# Patient Record
Sex: Female | Born: 1950 | Race: White | Hispanic: No | Smoking: Former smoker
Health system: Southern US, Community
[De-identification: ages and names within clinical notes are randomized; demographics above are authoritative.]

## PROBLEM LIST (undated history)

## (undated) DIAGNOSIS — G8929 Other chronic pain: Secondary | ICD-10-CM

## (undated) DIAGNOSIS — J45909 Unspecified asthma, uncomplicated: Secondary | ICD-10-CM

## (undated) DIAGNOSIS — A6000 Herpesviral infection of urogenital system, unspecified: Secondary | ICD-10-CM

## (undated) DIAGNOSIS — IMO0001 Reserved for inherently not codable concepts without codable children: Secondary | ICD-10-CM

## (undated) DIAGNOSIS — I272 Pulmonary hypertension, unspecified: Secondary | ICD-10-CM

## (undated) DIAGNOSIS — R7302 Impaired glucose tolerance (oral): Secondary | ICD-10-CM

## (undated) DIAGNOSIS — G4730 Sleep apnea, unspecified: Secondary | ICD-10-CM

## (undated) DIAGNOSIS — K921 Melena: Secondary | ICD-10-CM

## (undated) DIAGNOSIS — I10 Essential (primary) hypertension: Secondary | ICD-10-CM

## (undated) DIAGNOSIS — J449 Chronic obstructive pulmonary disease, unspecified: Secondary | ICD-10-CM

## (undated) DIAGNOSIS — F329 Major depressive disorder, single episode, unspecified: Secondary | ICD-10-CM

## (undated) DIAGNOSIS — M1990 Unspecified osteoarthritis, unspecified site: Secondary | ICD-10-CM

## (undated) DIAGNOSIS — F411 Generalized anxiety disorder: Secondary | ICD-10-CM

## (undated) DIAGNOSIS — J189 Pneumonia, unspecified organism: Secondary | ICD-10-CM

## (undated) DIAGNOSIS — R32 Unspecified urinary incontinence: Secondary | ICD-10-CM

## (undated) DIAGNOSIS — Z9889 Other specified postprocedural states: Secondary | ICD-10-CM

## (undated) DIAGNOSIS — Z87442 Personal history of urinary calculi: Secondary | ICD-10-CM

## (undated) DIAGNOSIS — N3281 Overactive bladder: Secondary | ICD-10-CM

## (undated) DIAGNOSIS — R0683 Snoring: Secondary | ICD-10-CM

## (undated) DIAGNOSIS — K219 Gastro-esophageal reflux disease without esophagitis: Secondary | ICD-10-CM

## (undated) DIAGNOSIS — Z8719 Personal history of other diseases of the digestive system: Secondary | ICD-10-CM

## (undated) DIAGNOSIS — I499 Cardiac arrhythmia, unspecified: Secondary | ICD-10-CM

## (undated) DIAGNOSIS — M545 Low back pain, unspecified: Secondary | ICD-10-CM

## (undated) DIAGNOSIS — E785 Hyperlipidemia, unspecified: Secondary | ICD-10-CM

## (undated) DIAGNOSIS — E119 Type 2 diabetes mellitus without complications: Secondary | ICD-10-CM

## (undated) DIAGNOSIS — J309 Allergic rhinitis, unspecified: Secondary | ICD-10-CM

## (undated) DIAGNOSIS — C801 Malignant (primary) neoplasm, unspecified: Secondary | ICD-10-CM

## (undated) DIAGNOSIS — G4719 Other hypersomnia: Secondary | ICD-10-CM

## (undated) HISTORY — DX: Other hypersomnia: G47.19

## (undated) HISTORY — PX: ABDOMINAL HYSTERECTOMY: SHX81

## (undated) HISTORY — PX: APPENDECTOMY: SHX54

## (undated) HISTORY — DX: Unspecified osteoarthritis, unspecified site: M19.90

## (undated) HISTORY — DX: Hyperlipidemia, unspecified: E78.5

## (undated) HISTORY — DX: Impaired glucose tolerance (oral): R73.02

## (undated) HISTORY — PX: CARPAL TUNNEL RELEASE: SHX101

## (undated) HISTORY — PX: SKIN CANCER EXCISION: SHX779

## (undated) HISTORY — DX: Snoring: R06.83

## (undated) HISTORY — DX: Essential (primary) hypertension: I10

## (undated) HISTORY — PX: CYST EXCISION: SHX5701

## (undated) HISTORY — DX: Overactive bladder: N32.81

## (undated) HISTORY — DX: Generalized anxiety disorder: F41.1

## (undated) HISTORY — DX: Type 2 diabetes mellitus without complications: E11.9

## (undated) HISTORY — PX: HEEL SPUR SURGERY: SHX665

## (undated) HISTORY — DX: Pulmonary hypertension, unspecified: I27.20

## (undated) HISTORY — DX: Melena: K92.1

## (undated) HISTORY — DX: Herpesviral infection of urogenital system, unspecified: A60.00

## (undated) HISTORY — PX: TONSILLECTOMY AND ADENOIDECTOMY: SUR1326

## (undated) HISTORY — DX: Major depressive disorder, single episode, unspecified: F32.9

## (undated) HISTORY — DX: Unspecified asthma, uncomplicated: J45.909

## (undated) HISTORY — DX: Allergic rhinitis, unspecified: J30.9

## (undated) HISTORY — DX: Chronic obstructive pulmonary disease, unspecified: J44.9

## (undated) HISTORY — PX: KNEE SURGERY: SHX244

---

## 2001-05-03 ENCOUNTER — Other Ambulatory Visit: Admission: RE | Admit: 2001-05-03 | Discharge: 2001-05-03 | Payer: Self-pay | Admitting: *Deleted

## 2002-04-20 HISTORY — PX: COLONOSCOPY: SHX174

## 2002-07-06 ENCOUNTER — Encounter: Payer: Self-pay | Admitting: Sports Medicine

## 2002-07-06 ENCOUNTER — Encounter: Admission: RE | Admit: 2002-07-06 | Discharge: 2002-07-06 | Payer: Self-pay | Admitting: Sports Medicine

## 2002-07-31 ENCOUNTER — Other Ambulatory Visit: Admission: RE | Admit: 2002-07-31 | Discharge: 2002-07-31 | Payer: Self-pay | Admitting: *Deleted

## 2003-08-02 ENCOUNTER — Other Ambulatory Visit: Admission: RE | Admit: 2003-08-02 | Discharge: 2003-08-02 | Payer: Self-pay | Admitting: *Deleted

## 2005-07-27 ENCOUNTER — Ambulatory Visit: Payer: Self-pay | Admitting: Internal Medicine

## 2005-08-03 ENCOUNTER — Ambulatory Visit: Payer: Self-pay | Admitting: Internal Medicine

## 2005-12-31 ENCOUNTER — Ambulatory Visit: Payer: Self-pay | Admitting: Internal Medicine

## 2006-03-16 ENCOUNTER — Ambulatory Visit: Payer: Self-pay | Admitting: Internal Medicine

## 2006-07-16 ENCOUNTER — Ambulatory Visit: Payer: Self-pay | Admitting: Internal Medicine

## 2006-08-02 ENCOUNTER — Ambulatory Visit: Payer: Self-pay | Admitting: Internal Medicine

## 2006-08-13 ENCOUNTER — Ambulatory Visit: Payer: Self-pay | Admitting: Internal Medicine

## 2006-08-13 LAB — CONVERTED CEMR LAB
ALT: 17 units/L (ref 0–40)
AST: 16 units/L (ref 0–37)
Albumin: 3.6 g/dL (ref 3.5–5.2)
Alkaline Phosphatase: 48 units/L (ref 39–117)
BUN: 10 mg/dL (ref 6–23)
Basophils Absolute: 0 10*3/uL (ref 0.0–0.1)
Basophils Relative: 0.6 % (ref 0.0–1.0)
Bilirubin Urine: NEGATIVE
Bilirubin, Direct: 0.1 mg/dL (ref 0.0–0.3)
CO2: 30 meq/L (ref 19–32)
Calcium: 9 mg/dL (ref 8.4–10.5)
Chloride: 103 meq/L (ref 96–112)
Cholesterol: 169 mg/dL (ref 0–200)
Creatinine, Ser: 0.6 mg/dL (ref 0.4–1.2)
Eosinophils Absolute: 0.2 10*3/uL (ref 0.0–0.6)
Eosinophils Relative: 2.9 % (ref 0.0–5.0)
GFR calc Af Amer: 134 mL/min
GFR calc non Af Amer: 111 mL/min
Glucose, Bld: 102 mg/dL — ABNORMAL HIGH (ref 70–99)
HCT: 37.1 % (ref 36.0–46.0)
HDL: 47 mg/dL (ref >39.0)
Hemoglobin, Urine: NEGATIVE
Hemoglobin: 12.6 g/dL (ref 12.0–15.0)
Ketones, ur: NEGATIVE mg/dL
LDL Cholesterol: 91 mg/dL (ref 0–99)
Leukocytes, UA: NEGATIVE
Lymphocytes Relative: 39.7 % (ref 12.0–46.0)
MCHC: 34 g/dL (ref 30.0–36.0)
MCV: 88.4 fL (ref 78.0–100.0)
Monocytes Absolute: 0.4 10*3/uL (ref 0.2–0.7)
Monocytes Relative: 5.2 % (ref 3.0–11.0)
Neutro Abs: 4.3 10*3/uL (ref 1.4–7.7)
Neutrophils Relative %: 51.6 % (ref 43.0–77.0)
Nitrite: NEGATIVE
Platelets: 249 10*3/uL (ref 150–400)
Potassium: 4.1 meq/L (ref 3.5–5.1)
RBC: 4.2 M/uL (ref 3.87–5.11)
RDW: 13.7 % (ref 11.5–14.6)
Sodium: 139 meq/L (ref 135–145)
Specific Gravity, Urine: 1.01 (ref 1.000–1.03)
TSH: 4.25 microintl units/mL (ref 0.35–5.50)
Total Bilirubin: 0.9 mg/dL (ref 0.3–1.2)
Total CHOL/HDL Ratio: 3.6
Total Protein, Urine: NEGATIVE mg/dL
Total Protein: 6.3 g/dL (ref 6.0–8.3)
Triglycerides: 156 mg/dL — ABNORMAL HIGH (ref 0–149)
Urine Glucose: NEGATIVE mg/dL
Urobilinogen, UA: 0.2 (ref 0.0–1.0)
VLDL: 31 mg/dL (ref 0–40)
WBC: 8.1 10*3/uL (ref 4.5–10.5)
pH: 6.5 (ref 5.0–8.0)

## 2006-08-17 ENCOUNTER — Ambulatory Visit: Payer: Self-pay | Admitting: Internal Medicine

## 2006-10-18 ENCOUNTER — Ambulatory Visit (HOSPITAL_BASED_OUTPATIENT_CLINIC_OR_DEPARTMENT_OTHER): Admission: RE | Admit: 2006-10-18 | Discharge: 2006-10-18 | Payer: Self-pay | Admitting: Orthopedic Surgery

## 2006-12-03 DIAGNOSIS — A6000 Herpesviral infection of urogenital system, unspecified: Secondary | ICD-10-CM

## 2006-12-03 DIAGNOSIS — E039 Hypothyroidism, unspecified: Secondary | ICD-10-CM | POA: Insufficient documentation

## 2006-12-03 DIAGNOSIS — E7800 Pure hypercholesterolemia, unspecified: Secondary | ICD-10-CM | POA: Insufficient documentation

## 2006-12-03 DIAGNOSIS — I10 Essential (primary) hypertension: Secondary | ICD-10-CM

## 2006-12-03 HISTORY — DX: Herpesviral infection of urogenital system, unspecified: A60.00

## 2006-12-03 HISTORY — DX: Essential (primary) hypertension: I10

## 2007-04-06 ENCOUNTER — Telehealth (INDEPENDENT_AMBULATORY_CARE_PROVIDER_SITE_OTHER): Payer: Self-pay | Admitting: *Deleted

## 2007-08-20 ENCOUNTER — Emergency Department (HOSPITAL_COMMUNITY): Admission: EM | Admit: 2007-08-20 | Discharge: 2007-08-20 | Payer: Self-pay | Admitting: Family Medicine

## 2008-04-03 ENCOUNTER — Emergency Department (HOSPITAL_COMMUNITY): Admission: EM | Admit: 2008-04-03 | Discharge: 2008-04-03 | Payer: Self-pay | Admitting: Emergency Medicine

## 2008-07-17 ENCOUNTER — Emergency Department (HOSPITAL_COMMUNITY): Admission: EM | Admit: 2008-07-17 | Discharge: 2008-07-17 | Payer: Self-pay | Admitting: Family Medicine

## 2008-08-16 ENCOUNTER — Ambulatory Visit: Payer: Self-pay | Admitting: Internal Medicine

## 2008-08-16 DIAGNOSIS — J449 Chronic obstructive pulmonary disease, unspecified: Secondary | ICD-10-CM | POA: Insufficient documentation

## 2008-08-16 DIAGNOSIS — F329 Major depressive disorder, single episode, unspecified: Secondary | ICD-10-CM

## 2008-08-16 DIAGNOSIS — J309 Allergic rhinitis, unspecified: Secondary | ICD-10-CM

## 2008-08-16 DIAGNOSIS — F32A Depression, unspecified: Secondary | ICD-10-CM | POA: Insufficient documentation

## 2008-08-16 DIAGNOSIS — E785 Hyperlipidemia, unspecified: Secondary | ICD-10-CM

## 2008-08-16 DIAGNOSIS — F411 Generalized anxiety disorder: Secondary | ICD-10-CM

## 2008-08-16 DIAGNOSIS — J45909 Unspecified asthma, uncomplicated: Secondary | ICD-10-CM

## 2008-08-16 DIAGNOSIS — J4489 Other specified chronic obstructive pulmonary disease: Secondary | ICD-10-CM

## 2008-08-16 DIAGNOSIS — F3289 Other specified depressive episodes: Secondary | ICD-10-CM

## 2008-08-16 HISTORY — DX: Other specified depressive episodes: F32.89

## 2008-08-16 HISTORY — DX: Major depressive disorder, single episode, unspecified: F32.9

## 2008-08-16 HISTORY — DX: Hyperlipidemia, unspecified: E78.5

## 2008-08-16 HISTORY — DX: Generalized anxiety disorder: F41.1

## 2008-08-16 HISTORY — DX: Allergic rhinitis, unspecified: J30.9

## 2008-08-16 HISTORY — DX: Other specified chronic obstructive pulmonary disease: J44.89

## 2008-08-16 HISTORY — DX: Chronic obstructive pulmonary disease, unspecified: J44.9

## 2008-08-16 HISTORY — DX: Unspecified asthma, uncomplicated: J45.909

## 2008-08-27 ENCOUNTER — Telehealth: Payer: Self-pay | Admitting: Internal Medicine

## 2008-09-05 ENCOUNTER — Ambulatory Visit: Payer: Self-pay | Admitting: Internal Medicine

## 2008-09-05 LAB — CONVERTED CEMR LAB
ALT: 26 units/L (ref 0–35)
AST: 30 units/L (ref 0–37)
Albumin: 3.5 g/dL (ref 3.5–5.2)
Alkaline Phosphatase: 80 units/L (ref 39–117)
BUN: 11 mg/dL (ref 6–23)
Basophils Absolute: 0.1 10*3/uL (ref 0.0–0.1)
Basophils Relative: 1.7 % (ref 0.0–3.0)
Bilirubin Urine: NEGATIVE
Bilirubin, Direct: 0.2 mg/dL (ref 0.0–0.3)
CO2: 30 meq/L (ref 19–32)
Calcium: 9 mg/dL (ref 8.4–10.5)
Chloride: 108 meq/L (ref 96–112)
Cholesterol: 176 mg/dL (ref 0–200)
Creatinine, Ser: 0.6 mg/dL (ref 0.4–1.2)
Direct LDL: 87.5 mg/dL
Eosinophils Absolute: 0.3 10*3/uL (ref 0.0–0.7)
Eosinophils Relative: 3.3 % (ref 0.0–5.0)
GFR calc non Af Amer: 110.04 mL/min (ref >60)
Glucose, Bld: 136 mg/dL — ABNORMAL HIGH (ref 70–99)
HCT: 36.8 % (ref 36.0–46.0)
HDL: 34.3 mg/dL — ABNORMAL LOW (ref >39.00)
Hemoglobin, Urine: NEGATIVE
Hemoglobin: 13.1 g/dL (ref 12.0–15.0)
Leukocytes, UA: NEGATIVE
Lymphocytes Relative: 26.3 % (ref 12.0–46.0)
Lymphs Abs: 2.1 10*3/uL (ref 0.7–4.0)
MCHC: 35.7 g/dL (ref 30.0–36.0)
MCV: 89.4 fL (ref 78.0–100.0)
Monocytes Absolute: 0.3 10*3/uL (ref 0.1–1.0)
Monocytes Relative: 4 % (ref 3.0–12.0)
Neutro Abs: 5.1 10*3/uL (ref 1.4–7.7)
Neutrophils Relative %: 64.7 % (ref 43.0–77.0)
Nitrite: NEGATIVE
Platelets: 219 10*3/uL (ref 150.0–400.0)
Potassium: 4.4 meq/L (ref 3.5–5.1)
RBC: 4.11 M/uL (ref 3.87–5.11)
RDW: 12.8 % (ref 11.5–14.6)
Sodium: 141 meq/L (ref 135–145)
Specific Gravity, Urine: 1.02 (ref 1.000–1.030)
TSH: 3.93 microintl units/mL (ref 0.35–5.50)
Total Bilirubin: 0.5 mg/dL (ref 0.3–1.2)
Total CHOL/HDL Ratio: 5
Total Protein, Urine: NEGATIVE mg/dL
Total Protein: 6.4 g/dL (ref 6.0–8.3)
Triglycerides: 433 mg/dL — ABNORMAL HIGH (ref 0.0–149.0)
Urine Glucose: NEGATIVE mg/dL
Urobilinogen, UA: 0.2 (ref 0.0–1.0)
VLDL: 86.6 mg/dL — ABNORMAL HIGH (ref 0.0–40.0)
WBC: 7.9 10*3/uL (ref 4.5–10.5)
pH: 6 (ref 5.0–8.0)

## 2008-09-06 LAB — CONVERTED CEMR LAB: Hgb A1c MFr Bld: 6.5 % (ref 4.6–6.5)

## 2008-09-10 ENCOUNTER — Ambulatory Visit: Payer: Self-pay | Admitting: Internal Medicine

## 2009-06-20 ENCOUNTER — Ambulatory Visit: Payer: Self-pay | Admitting: Internal Medicine

## 2009-06-20 DIAGNOSIS — K589 Irritable bowel syndrome without diarrhea: Secondary | ICD-10-CM | POA: Insufficient documentation

## 2009-06-20 DIAGNOSIS — J45901 Unspecified asthma with (acute) exacerbation: Secondary | ICD-10-CM | POA: Insufficient documentation

## 2009-06-20 DIAGNOSIS — K921 Melena: Secondary | ICD-10-CM

## 2009-06-20 DIAGNOSIS — E739 Lactose intolerance, unspecified: Secondary | ICD-10-CM | POA: Insufficient documentation

## 2009-06-20 HISTORY — DX: Melena: K92.1

## 2010-01-09 ENCOUNTER — Telehealth: Payer: Self-pay | Admitting: Internal Medicine

## 2010-01-13 ENCOUNTER — Telehealth: Payer: Self-pay | Admitting: Gastroenterology

## 2010-03-04 ENCOUNTER — Emergency Department (HOSPITAL_COMMUNITY): Admission: EM | Admit: 2010-03-04 | Discharge: 2010-03-04 | Payer: Self-pay | Admitting: Emergency Medicine

## 2010-05-20 NOTE — Progress Notes (Signed)
Summary: HCTZ  Phone Note Call from Patient   Caller: Patient Summary of Call: Pt states she would like to increase HCTZ dosage. She states she is taking 25mg  two times a day. She feels that it is not working--  please advise Initial call taken by: Brenton Grills MA,  January 09, 2010 5:12 PM  Follow-up for Phone Call        by my note last visit, there was no edema,  HCTZ normally does not help after the first 25 mg  needs OV to further address this issue Follow-up by: Corwin Levins MD,  January 09, 2010 5:49 PM  Additional Follow-up for Phone Call Additional follow up Details #1::        Spoke with female at home number  (husband) who advised that he would inform pt ot make appt for edema eval. Additional Follow-up by: Margaret Pyle, CMA,  January 10, 2010 11:52 AM

## 2010-05-20 NOTE — Assessment & Plan Note (Signed)
Summary: BLOOD IN STOOL/NWS   Vital Signs:  Patient profile:   60 year old female Height:      62 inches Weight:      228 pounds BMI:     41.85 O2 Sat:      97 % on Room air Temp:     97.6 degrees F oral Pulse rate:   62 / minute BP sitting:   132 / 90  (left arm) Cuff size:   large  Vitals Entered ByZella Ball Ewing (June 20, 2009 10:32 AM)  O2 Flow:  Room air  CC: Blood in stool/RE   CC:  Blood in stool/RE.  History of Present Illness: plans to apply for medicaid;  no insurance today;  c/w small volume trace blood on tissue only 6 times in the past 4 months without rectal pain, fever, abd pain, n/v, wt loss, night sweats or other constitutional symtpoms;  last colonscopy aprpox 5 yrs; has signficinat ongoing financial stressors at this time;  also with increased IBS type symptoms with crampy pains internittent with loose stool off and on for several motnhs, gas pain and "grumbling" - but no n/v, distension or vomiting or wt loss as above.  Incidently today also with mild wheezing without fever, prod cough, CP, sob/doe, orthopnea, pnd or worsening LE edema.    Problems Prior to Update: 1)  Asthma Unspecified With Exacerbation  (ICD-493.92) 2)  Glucose Intolerance  (ICD-271.3) 3)  Preventive Health Care  (ICD-V70.0) 4)  Allergic Rhinitis  (ICD-477.9) 5)  Depression  (ICD-311) 6)  Hyperlipidemia  (ICD-272.4) 7)  COPD  (ICD-496) 8)  Anxiety  (ICD-300.00) 9)  Asthma  (ICD-493.90) 10)  Genital Herpes  (ICD-054.10) 11)  Hypercholesterolemia  (ICD-272.0) 12)  Hypothyroidism  (ICD-244.9) 13)  Hypertension  (ICD-401.9)  Medications Prior to Update: 1)  Alprazolam 1 Mg Tabs (Alprazolam) .... Take 1 Tablet By Mouth Twice A Day 2)  Diclofenac Sodium 75 Mg Tbec (Diclofenac Sodium) .... Take 1 Tablet By Mouth Twice A Day 3)  Estradiol 2 Mg Tabs (Estradiol) .... Take 1 Tablet By Mouth Once A Day 4)  Metoprolol Tartrate 50 Mg Tabs (Metoprolol Tartrate) .Marland Kitchen.. 1 By Mouth Two Times A Day 5)   Pravastatin Sodium 40 Mg Tabs (Pravastatin Sodium) .... Take 1 Tablet By Mouth Once A Day 6)  Ventolin Hfa 108 (90 Base) Mcg/act Aers (Albuterol Sulfate) .Marland Kitchen.. 1-2 Puff As Needed 7)  Flovent Hfa 220 Mcg/act Aero (Fluticasone Propionate  Hfa) .Marland Kitchen.. 1 Puff Two Times A Day 8)  Zoloft 100 Mg Tabs (Sertraline Hcl) .Marland Kitchen.. 1 By Mouth Once Daily 9)  Adult Aspirin Ec Low Strength 81 Mg Tbec (Aspirin) .Marland Kitchen.. 1po Once Daily 10)  Hydrochlorothiazide 25 Mg Tabs (Hydrochlorothiazide) .Marland Kitchen.. 1 By Mouth Once Daily  Current Medications (verified): 1)  Alprazolam 1 Mg Tabs (Alprazolam) .... Take 1 Tablet By Mouth Twice A Day As Needed Nerves 2)  Diclofenac Sodium 75 Mg Tbec (Diclofenac Sodium) .... Take 1 Tablet By Mouth Twice A Day As Needed Pain 3)  Estradiol 2 Mg Tabs (Estradiol) .... Take 1 Tablet By Mouth Once A Day 4)  Metoprolol Tartrate 50 Mg Tabs (Metoprolol Tartrate) .Marland Kitchen.. 1 By Mouth Two Times A Day 5)  Pravastatin Sodium 40 Mg Tabs (Pravastatin Sodium) .... Take 1 Tablet By Mouth Once A Day 6)  Ventolin Hfa 108 (90 Base) Mcg/act Aers (Albuterol Sulfate) .Marland Kitchen.. 1-2 Puff As Needed 7)  Advair Diskus 250-50 Mcg/dose Aepb (Fluticasone-Salmeterol) .Marland Kitchen.. 1 Puff Two Times A Day 8)  Zoloft 100 Mg Tabs (Sertraline Hcl) .Marland Kitchen.. 1 By Mouth Once Daily 9)  Adult Aspirin Ec Low Strength 81 Mg Tbec (Aspirin) .Marland Kitchen.. 1po Once Daily 10)  Hydrochlorothiazide 25 Mg Tabs (Hydrochlorothiazide) .Marland Kitchen.. 1 By Mouth Once Daily 11)  Prednisone 10 Mg Tabs (Prednisone) .... 3po Qd For 3days, Then 2po Qd For 3days, Then 1po Qd For 3days, Then Stop 12)  Anusol-Hc 25 Mg Supp (Hydrocortisone Acetate) .Marland Kitchen.. 1 Pr Two Times A Day As Needed  Allergies (verified): 1)  Tramadol Hcl (Tramadol Hcl)  Past History:  Past Surgical History: Last updated: 12/03/2006 Appendectomy Tonsillectomy adnoidnectomy right carple tunnel syndrome Hysterectomy  Social History: Last updated: 06/20/2009 Married 1 son currently unemployed - former [employer] Alcohol use-yes Former Smoker disabled - from march 2010  Risk Factors: Smoking Status: quit (08/16/2008)  Past Medical History: Hypertension Hypothyroidism Asthma Anxiety COPD Hyperlipidemia Depression DJD lumber, hands, knee hx of genital herpes Allergic rhinitis glucose intolerance  Social History: Reviewed history from 08/16/2008 and no changes required. 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   Head:  normocephalic and atraumatic.   Eyes:  vision grossly intact, pupils equal, and pupils round.   Ears:  R ear normal and L ear normal.   Nose:  no external deformity and no nasal discharge.   Mouth:  no gingival abnormalities and pharynx pink and moist.   Neck:  supple and no masses.   Lungs:  normal respiratory effort and normal breath sounds.   Heart:  normal rate and regular rhythm.   Abdomen:  soft, non-tender, and normal bowel sounds.   Msk:  no joint tenderness and no joint swelling.   Extremities:  no edema, no erythema    Impression & Recommendations:  Problem # 1:  ASTHMA UNSPECIFIED WITH EXACERBATION (ICD-493.92)  Her updated medication list for this problem includes:    Ventolin Hfa 108 (90 Base) Mcg/act Aers (Albuterol sulfate) .Marland Kitchen... 1-2 puff as needed    Advair Diskus 250-50 Mcg/dose Aepb (Fluticasone-salmeterol) .Marland Kitchen... 1 puff two times a day    Prednisone 10 Mg Tabs (Prednisone) .Marland Kitchen... 3po qd for 3days, then 2po qd for 3days, then 1po qd for 3days, then stop treat as above, f/u any worsening signs or symptoms , gave advair smaple as well   Problem # 2:  HYPERTENSION (ICD-401.9)  Her updated medication list for this problem includes:    Metoprolol Tartrate 50 Mg Tabs (Metoprolol tartrate) .Marland Kitchen... 1 by mouth two times a day     Hydrochlorothiazide 25 Mg Tabs (Hydrochlorothiazide) .Marland Kitchen... 1 by mouth once daily  BP today: 132/90 Prior BP: 150/112 (09/10/2008)  Labs Reviewed: K+: 4.4 (09/05/2008) Creat: : 0.6 (09/05/2008)   Chol: 176 (09/05/2008)   HDL: 34.30 (09/05/2008)   LDL: 91 (08/13/2006)   TG: 433.0 (09/05/2008) stable overall by hx and exam, ok to continue meds/tx as is   Problem # 3:  IBS (ICD-564.1) stable overall by hx and exam, ok to continue meds/tx as is   Problem # 4:  HEMATOCHEZIA (ICD-578.1) small volume, diff includes hemorrhoid, fissure, malignancy or other;  tx for now with anusol HC  asd, I rec'd colonoscopy but she declines at this time  Complete Medication List: 1)  Alprazolam 1 Mg Tabs (Alprazolam) .... Take 1  tablet by mouth twice a day as needed nerves 2)  Diclofenac Sodium 75 Mg Tbec (Diclofenac sodium) .... Take 1 tablet by mouth twice a day as needed pain 3)  Estradiol 2 Mg Tabs (Estradiol) .... Take 1 tablet by mouth once a day 4)  Metoprolol Tartrate 50 Mg Tabs (Metoprolol tartrate) .Marland Kitchen.. 1 by mouth two times a day 5)  Pravastatin Sodium 40 Mg Tabs (Pravastatin sodium) .... Take 1 tablet by mouth once a day 6)  Ventolin Hfa 108 (90 Base) Mcg/act Aers (Albuterol sulfate) .Marland Kitchen.. 1-2 puff as needed 7)  Advair Diskus 250-50 Mcg/dose Aepb (Fluticasone-salmeterol) .Marland Kitchen.. 1 puff two times a day 8)  Zoloft 100 Mg Tabs (Sertraline hcl) .Marland Kitchen.. 1 by mouth once daily 9)  Adult Aspirin Ec Low Strength 81 Mg Tbec (Aspirin) .Marland Kitchen.. 1po once daily 10)  Hydrochlorothiazide 25 Mg Tabs (Hydrochlorothiazide) .Marland Kitchen.. 1 by mouth once daily 11)  Prednisone 10 Mg Tabs (Prednisone) .... 3po qd for 3days, then 2po qd for 3days, then 1po qd for 3days, then stop 12)  Anusol-hc 25 Mg Supp (Hydrocortisone acetate) .Marland Kitchen.. 1 pr two times a day as needed  Patient Instructions: 1)  Please take all new medications as prescribed 2)  Continue all previous medications as before this visit  3)  Please schedule a follow-up appointment  in 1 year or sooner if needed Prescriptions: ANUSOL-HC 25 MG SUPP (HYDROCORTISONE ACETATE) 1 pr two times a day as needed  #20 x 1   Entered and Authorized by:   Corwin Levins MD   Signed by:   Corwin Levins MD on 06/20/2009   Method used:   Print then Give to Patient   RxID:   [id number] PREDNISONE 10 MG TABS (PREDNISONE) 3po qd for 3days, then 2po qd for 3days, then 1po qd for 3days, then stop  #18 x 0   Entered and Authorized by:   Corwin Levins MD   Signed by:   Corwin Levins MD on 06/20/2009   Method used:   Print then Give to Patient   RxID:   [id number] ALPRAZOLAM 1 MG TABS (ALPRAZOLAM) Take 1 tablet by mouth twice a day as needed nerves  #60 x 2   Entered and Authorized by:   Corwin Levins MD   Signed by:   Corwin Levins MD on 06/20/2009   Method used:   Print then Give to Patient   RxID:   [id number] DICLOFENAC SODIUM 75 MG TBEC (DICLOFENAC SODIUM) Take 1 tablet by mouth twice a day as needed pain  #180 x 3   Entered and Authorized by:   Corwin Levins MD   Signed by:   Corwin Levins MD on 06/20/2009   Method used:   Print then Give to Patient   RxID:   [id number] ESTRADIOL 2 MG TABS (ESTRADIOL) Take 1 tablet by mouth once a day  #90 x 3   Entered and Authorized by:   Corwin Levins MD   Signed by:   Corwin Levins MD on 06/20/2009   Method used:   Print then Give to Patient   RxID:   [id number] METOPROLOL TARTRATE 50 MG TABS (METOPROLOL TARTRATE) 1 by mouth two times a day  #180 x 3   Entered and Authorized by:   Corwin Levins MD   Signed by:   Corwin Levins MD on 06/20/2009   Method used:   Print then Give to Patient   RxID:   [id number]  PRAVASTATIN SODIUM 40 MG TABS (PRAVASTATIN SODIUM) Take 1 tablet by mouth once a day  #90 x 3   Entered and Authorized by:   Corwin Levins MD   Signed by:   Corwin Levins MD on 06/20/2009   Method used:   Print then Give to Patient   RxID:   [id number] VENTOLIN HFA 108 (90 BASE) MCG/ACT AERS (ALBUTEROL SULFATE)  1-2 puff as needed  #1 x 11   Entered and Authorized by:   Corwin Levins MD   Signed by:   Corwin Levins MD on 06/20/2009   Method used:   Print then Give to Patient   RxID:   [id number] ZOLOFT 100 MG TABS (SERTRALINE HCL) 1 by mouth once daily  #90 x 3   Entered and Authorized by:   Corwin Levins MD   Signed by:   Corwin Levins MD on 06/20/2009   Method used:   Print then Give to Patient   RxID:   [id number] HYDROCHLOROTHIAZIDE 25 MG TABS (HYDROCHLOROTHIAZIDE) 1 by mouth once daily  #90 x 3   Entered and Authorized by:   Corwin Levins MD   Signed by:   Corwin Levins MD on 06/20/2009   Method used:   Print then Give to Patient   RxID:   [id number] ADVAIR DISKUS 250-50 MCG/DOSE AEPB (FLUTICASONE-SALMETEROL) 1 puff two times a day  #1 x 11   Entered and Authorized by:   Corwin Levins MD   Signed by:   Corwin Levins MD on 06/20/2009   Method used:   Print then Give to Patient   RxID:   [id number]

## 2010-05-20 NOTE — Progress Notes (Signed)
Summary: Switch Provider  Phone Note Call from Patient Call back at Home Phone [phone]   Caller: Patient Call For: Dr. Arlyce Dice Reason for Call: Talk to Nurse Summary of Call: Pt. would like to switch from Dr. Arlyce Dice to Dr. Juanda Chance b/c her mother and sister is a pt. of Dr. Juanda Chance and was highly recommended Initial call taken by: Karna Christmas,  January 13, 2010 4:51 PM  Follow-up for Phone Call        Dr Arlyce Dice, This pt wants to switch to Dr Juanda Chance is it okay? Follow-up by: Merri Ray CMA Duncan Dull),  January 13, 2010 4:59 PM  Additional Follow-up for Phone Call Additional follow up Details #1::        yes Additional Follow-up by: Louis Meckel MD,  January 14, 2010 10:19 AM    Additional Follow-up for Phone Call Additional follow up Details #2::    Dr Juanda Chance, Do you accept this pt? Follow-up by: Merri Ray CMA Duncan Dull),  January 14, 2010 10:36 AM  Additional Follow-up for Phone Call Additional follow up Details #3:: Details for Additional Follow-up Action Taken: OK Additional Follow-up by: Hart Carwin MD,  January 14, 2010 12:34 PM

## 2010-07-10 ENCOUNTER — Other Ambulatory Visit: Payer: Self-pay | Admitting: Internal Medicine

## 2010-07-10 NOTE — Telephone Encounter (Signed)
To robin   

## 2010-08-14 ENCOUNTER — Other Ambulatory Visit: Payer: Self-pay | Admitting: Internal Medicine

## 2010-09-02 ENCOUNTER — Other Ambulatory Visit: Payer: Self-pay | Admitting: Internal Medicine

## 2010-09-02 NOTE — Op Note (Signed)
Andrea Perry, Andrea Perry                 ACCOUNT NO.:  [id number]   MEDICAL RECORD NO.:  [id number]          PATIENT TYPE:  AMB   LOCATION:  [facility]                          FACILITY:  [facility]   PHYSICIAN:  Robert A. Thurston Hole, M.D. 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   PROCEDURE:  1. Right knee evaluation under anesthesia followed by arthroscopic      partial medial and lateral meniscectomies.  2. Right knee chondroplasty with partial synovectomy.  3. Right knee lateral release.   SURGEON:  Elana Alm. Thurston Hole, M.D.   ANESTHESIA:  General.   OPERATIVE TIME:  30 minutes.   COMPLICATIONS:  None.   INDICATIONS FOR PROCEDURE:  Andrea Perry is a 60 year old woman who has had  significant right knee pain for the past 6 months getting progressively  worse with exam and MRI documenting a meniscal tear and with  chondromalacia and synovitis.  She has failed conservative care and is  not to undergo arthroscopy.   DESCRIPTION:  Andrea Perry is brought to the operating room on October 18, 2006  after a knee block was placed in the holding area by anesthesia.  She  was placed on the operative table in the supine position.  Her right  knee was examined under anesthesia.  She had full range of motion, 1 to  2+ crepitation with slight lateral patella tracking.  The knee was an  otherwise stable ligamentous exam.  The right leg was prepped using  sterile DuraPrep and draped using sterile technique.  Originally through  an anterolateral portal the arthroscope with a pump attached was placed  into an anteromedial portal and arthroscopic probe was placed after the  knee was sterilely prepped and draped.  Initial  inspection of the medial  compartment, she had 50-75% grade 3 chondromalacia which was debrided,  medial meniscus tearing posterior and medial horn of which 30-40% was  resected back to a stable rim.  Intercondylar notch inspected.  Anterior  and posterior cruciate ligaments were normal.  Lateral compartment  inspected, 30-40% grade 3 chondromalacia on lateral tibial plateau,  which was debrided.  Lateral femoral condyle showed only grade 1 and 2  changes.  Lateral meniscus tear 20% posterior and lateral corner was  resected back to a stable rim.  Patellofemoral joint showed 50% grade 3  chondromalacia on the patellofemoral groove and this was debrided.  The  patella showed slight patella tracking.  Significant synovitis in the  medial and lateral gutters were debrided.  A lateral release was carried  out with an ArthroCare wand decompressing the patellofemoral joint and  improving patella tracking.  No excessive bleeding encountered during  this.  No other pathology in the medial and lateral gutters were found.  At this point, it was felt that all pathology had been satisfactorily  addressed.  The instruments were removed.  The portals were closed with  3-0 nylon suture and injected with 0.25% Marcaine with epinephrine and 4  mg of morphine.  A sterile dressing was applied and the patient awakened  and taken to the recovery room in stable condition.   FOLLOW-UP CARE:  Andrea Perry will be followed either as an outpatient or  overnight recovery care depending on her postoperative course.  Since  she is a sleep apnea patient, we will watch her closely in the recovery  room and make this decision.  She will be seen back in the office in 1  week for sutures out and followup.      Robert A. Thurston Hole, M.D.  Electronically Signed     RAW/MEDQ  D:  10/18/2006  T:  10/18/2006  Job:  [id number]

## 2010-09-05 ENCOUNTER — Other Ambulatory Visit: Payer: Self-pay | Admitting: Internal Medicine

## 2010-09-05 NOTE — Assessment & Plan Note (Signed)
[facility]                           PRIMARY CARE OFFICE NOTE   Andrea Perry, Andrea Perry                        MRN:          [id number]  DATE:03/16/2006                            DOB:          19-Jun-1950    Andrea Perry is a 60 year old Caucasian woman, generally followed by Dr. Efrain Sella.  I am seeing her with no chart.  She is followed for hypertension,  hyperlipidemia, mild COPD.   The patient presents today because of a petechial rash on both ankles at  the medial aspect.  She reports this is not pruritic.  She reports this  developed over the last week or so.  She is on her feet quite a bit.  She reports there has been some mild discomfort with this.  The patient  is not known to be diabetic.  She is not known to have any hematologic  disorder.   PAST MEDICAL HISTORY:  Not available to me.   CURRENT MEDICATIONS:  1. Estradiol 1 mg daily.  2. Xanax 1 mg b.i.d. p.r.n.  3. Metoprolol 50 mg b.i.d.  4. Pravastatin 40 mg q.p.m.  5. Advair 250/50, one inhalation a.m. and h.s.   PHYSICAL EXAMINATION:  VITAL SIGNS:  Temperature is 97.9, blood pressure  158/81, pulse 51, weight 207.  GENERAL APPEARANCE:  A heavy set Caucasian woman in no acute distress.  She has mild plethora.  DERMATOLOGIC:  The patient does have a petechial rash on the medial  aspect of the distal lower extremities.  There is no tenderness.  These  lesions do not blanch.   ASSESSMENT:  Petechial rash.  A patient with a non-pruritic, non-painful  petechial rash in the setting of no known diabetes, no known hematologic  disorder.   PLAN:  The patient is to have laboratory including a sedimentation rate.  We will check CBC with diff to particularly rule out thrombocytopenia.  There is no evidence of infection at this time, so I do not need to  worry about blood cultures or other significant ID issues.  The patient  will have hemoglobin A1c checked to rule out diabetes on a definitive  basis.  The patient is prescribed Topicort 0.05% cream to apply to the  petechial areas twice a day.  She will be notified by phone of her lab  results when available.     Rosalyn Gess Norins, MD  Electronically Signed    MEN/MedQ  DD: 03/16/2006  DT: 03/16/2006  Job #: [id number]

## 2010-09-15 ENCOUNTER — Other Ambulatory Visit: Payer: Self-pay | Admitting: Internal Medicine

## 2010-09-21 ENCOUNTER — Encounter: Payer: Self-pay | Admitting: Internal Medicine

## 2010-09-21 DIAGNOSIS — R7302 Impaired glucose tolerance (oral): Secondary | ICD-10-CM

## 2010-09-21 DIAGNOSIS — E119 Type 2 diabetes mellitus without complications: Secondary | ICD-10-CM | POA: Insufficient documentation

## 2010-09-21 DIAGNOSIS — Z0001 Encounter for general adult medical examination with abnormal findings: Secondary | ICD-10-CM | POA: Insufficient documentation

## 2010-09-21 DIAGNOSIS — J45909 Unspecified asthma, uncomplicated: Secondary | ICD-10-CM | POA: Insufficient documentation

## 2010-09-21 HISTORY — DX: Impaired glucose tolerance (oral): R73.02

## 2010-09-24 ENCOUNTER — Other Ambulatory Visit (INDEPENDENT_AMBULATORY_CARE_PROVIDER_SITE_OTHER): Payer: Medicare Other

## 2010-09-24 ENCOUNTER — Encounter: Payer: Self-pay | Admitting: Internal Medicine

## 2010-09-24 ENCOUNTER — Other Ambulatory Visit: Payer: Self-pay | Admitting: Internal Medicine

## 2010-09-24 ENCOUNTER — Ambulatory Visit (INDEPENDENT_AMBULATORY_CARE_PROVIDER_SITE_OTHER): Payer: Medicare Other | Admitting: Internal Medicine

## 2010-09-24 VITALS — BP 128/90 | HR 62 | Temp 97.5°F | Ht 62.0 in | Wt 238.0 lb

## 2010-09-24 DIAGNOSIS — Z0000 Encounter for general adult medical examination without abnormal findings: Secondary | ICD-10-CM

## 2010-09-24 DIAGNOSIS — K921 Melena: Secondary | ICD-10-CM

## 2010-09-24 DIAGNOSIS — F411 Generalized anxiety disorder: Secondary | ICD-10-CM

## 2010-09-24 DIAGNOSIS — Z79899 Other long term (current) drug therapy: Secondary | ICD-10-CM

## 2010-09-24 DIAGNOSIS — R7309 Other abnormal glucose: Secondary | ICD-10-CM

## 2010-09-24 DIAGNOSIS — Z23 Encounter for immunization: Secondary | ICD-10-CM

## 2010-09-24 DIAGNOSIS — R609 Edema, unspecified: Secondary | ICD-10-CM

## 2010-09-24 DIAGNOSIS — R7302 Impaired glucose tolerance (oral): Secondary | ICD-10-CM

## 2010-09-24 DIAGNOSIS — R1013 Epigastric pain: Secondary | ICD-10-CM

## 2010-09-24 DIAGNOSIS — K3189 Other diseases of stomach and duodenum: Secondary | ICD-10-CM

## 2010-09-24 LAB — LDL CHOLESTEROL, DIRECT: Direct LDL: 84.1 mg/dL

## 2010-09-24 LAB — CBC WITH DIFFERENTIAL/PLATELET
Basophils Absolute: 0 10*3/uL (ref 0.0–0.1)
HCT: 36.1 % (ref 36.0–46.0)
Lymphs Abs: 2.5 10*3/uL (ref 0.7–4.0)
Monocytes Absolute: 0.4 10*3/uL (ref 0.1–1.0)
Monocytes Relative: 4.8 % (ref 3.0–12.0)
Neutrophils Relative %: 59.8 % (ref 43.0–77.0)
Platelets: 266 10*3/uL (ref 150.0–400.0)
RDW: 14 % (ref 11.5–14.6)

## 2010-09-24 LAB — BASIC METABOLIC PANEL
Calcium: 9.1 mg/dL (ref 8.4–10.5)
GFR: 103.26 mL/min (ref >60.00)
Glucose, Bld: 119 mg/dL — ABNORMAL HIGH (ref 70–99)
Potassium: 5.2 mEq/L — ABNORMAL HIGH (ref 3.5–5.1)
Sodium: 138 mEq/L (ref 135–145)

## 2010-09-24 LAB — URINALYSIS, ROUTINE W REFLEX MICROSCOPIC
Ketones, ur: NEGATIVE
Leukocytes, UA: NEGATIVE
Nitrite: NEGATIVE
Specific Gravity, Urine: >=1.03 (ref 1.000–1.030)
Urobilinogen, UA: 0.2 (ref 0.0–1.0)
pH: 5.5 (ref 5.0–8.0)

## 2010-09-24 LAB — LIPID PANEL
Cholesterol: 150 mg/dL (ref 0–200)
HDL: 39.5 mg/dL (ref >39.00)
VLDL: 61 mg/dL — ABNORMAL HIGH (ref 0.0–40.0)

## 2010-09-24 LAB — HEPATIC FUNCTION PANEL
ALT: 20 U/L (ref 0–35)
AST: 17 U/L (ref 0–37)
Total Protein: 7.1 g/dL (ref 6.0–8.3)

## 2010-09-24 LAB — TSH: TSH: 2.86 u[IU]/mL (ref 0.35–5.50)

## 2010-09-24 MED ORDER — FLUTICASONE-SALMETEROL 250-50 MCG/DOSE IN AEPB: 1.0000 | INHALATION_SPRAY | Freq: Two times a day (BID) | RESPIRATORY_TRACT | Status: DC

## 2010-09-24 MED ORDER — HYDROCORTISONE ACETATE 25 MG RE SUPP: 25.0000 mg | Freq: Two times a day (BID) | RECTAL | Status: DC | PRN

## 2010-09-24 MED ORDER — ALBUTEROL SULFATE HFA 108 (90 BASE) MCG/ACT IN AERS: 2.0000 | INHALATION_SPRAY | Freq: Four times a day (QID) | RESPIRATORY_TRACT | Status: DC | PRN

## 2010-09-24 MED ORDER — ESTRADIOL 2 MG PO TABS: 2.0000 mg | ORAL_TABLET | Freq: Every day | ORAL | Status: DC

## 2010-09-24 MED ORDER — FUROSEMIDE 20 MG PO TABS: 20.0000 mg | ORAL_TABLET | Freq: Every day | ORAL | Status: DC

## 2010-09-24 MED ORDER — DICLOFENAC SODIUM 75 MG PO TBEC: 75.0000 mg | DELAYED_RELEASE_TABLET | Freq: Two times a day (BID) | ORAL | Status: DC

## 2010-09-24 MED ORDER — METOPROLOL TARTRATE 50 MG PO TABS: 50.0000 mg | ORAL_TABLET | Freq: Two times a day (BID) | ORAL | Status: DC

## 2010-09-24 MED ORDER — PRAVASTATIN SODIUM 40 MG PO TABS: 40.0000 mg | ORAL_TABLET | Freq: Every day | ORAL | Status: DC

## 2010-09-24 MED ORDER — PANTOPRAZOLE SODIUM 40 MG PO TBEC: 40.0000 mg | DELAYED_RELEASE_TABLET | Freq: Every day | ORAL | Status: DC

## 2010-09-24 MED ORDER — TETANUS-DIPHTH-ACELL PERTUSSIS 5-2.5-18.5 LF-MCG/0.5 IM SUSP
0.5000 mL | Freq: Once | INTRAMUSCULAR | Status: AC
  Administered 2010-09-24: 0.5 mL via INTRAMUSCULAR

## 2010-09-24 MED ORDER — ALPRAZOLAM 1 MG PO TABS: 1.0000 mg | ORAL_TABLET | Freq: Two times a day (BID) | ORAL | Status: DC

## 2010-09-24 MED ORDER — ESCITALOPRAM OXALATE 10 MG PO TABS: 10.0000 mg | ORAL_TABLET | Freq: Every day | ORAL | Status: DC

## 2010-09-24 NOTE — Progress Notes (Signed)
Subjective:    Patient ID: Andrea Perry, female    DOB: 03-22-1951, 60 y.o.   MRN: [id number]  HPI  Here for wellness and f/u;  Overall doing ok;  Pt denies CP, worsening SOB, DOE, wheezing, orthopnea, PND, worsening LE edema, palpitations, dizziness or syncope.  Pt denies neurological change such as new Headache, facial or extremity weakness.  Pt denies polydipsia, polyuria, or low sugar symptoms. Pt states overall good compliance with treatment and medications, good tolerability, and trying to follow lower cholesterol diet.  Pt denies worsening depressive symptoms, suicidal ideation or panic, though anixety not well controlled despite current meds.. No fever, wt loss, night sweats, loss of appetite, or other constitutional symptoms.  Pt states good ability with ADL's, low fall risk, home safety reviewed and adequate, no significant changes in hearing or vision, and occasionally active with exercise.    Has been disabled since 09/21/07 due to pulm issues;  Husband died 2007/09/21, gained 40 lbs since that time, asthma may be worse and increased DOE. Still with recurrent intermittent rectal bleeding, and was not able to see GI yet, wanted to see Dr Juanda Chance, has colonoscopy for September 20, 2012 due.   Has also had approx 3-4 wks dyspeptic symtpoms as well , but Denies worsening reflux, dysphagia, abd pain, n/v, bowel change. Past Medical History  Diagnosis Date  . Impaired glucose tolerance 09/21/2010  . Asthma 09/21/2010  . ALLERGIC RHINITIS 08/16/2008  . ANXIETY 08/16/2008  . ASTHMA 08/16/2008  . COPD 08/16/2008  . DEPRESSION 08/16/2008  . GENITAL HERPES 12/03/2006  . HEMATOCHEZIA 06/20/2009  . HYPERLIPIDEMIA 08/16/2008  . HYPERTENSION 12/03/2006  . HYPOTHYROIDISM 12/03/2006  . IBS 06/20/2009   Past Surgical History  Procedure Date  . Appendectomy   . Tonsillectomy   . Adnoidectomy   . Abdominal hysterectomy   . Right carpal tunnel syndrome     reports that she has quit smoking. She does not have any smokeless tobacco  history on file. She reports that she drinks alcohol. Her drug history not on file. family history includes Diabetes in her mother; Hyperlipidemia in her mother; Hypertension in her mother; and Stroke in her father. Allergies  Allergen Reactions  . Tramadol Hcl     REACTION: mouth dryness, headache   Current Outpatient Prescriptions on File Prior to Visit  Medication Sig Dispense Refill  . aspirin 81 MG EC tablet Take 81 mg by mouth daily.         Review of Systems Review of Systems  Constitutional: Negative for diaphoresis, activity change, appetite change and unexpected weight change.  HENT: Negative for hearing loss, ear pain, facial swelling, mouth sores and neck stiffness.   Eyes: Negative for pain, redness and visual disturbance.  Respiratory: Negative for shortness of breath and wheezing.   Cardiovascular: Negative for chest pain and palpitations.  Gastrointestinal: Negative for diarrhea, blood in stool, abdominal distention and rectal pain.  Genitourinary: Negative for hematuria, flank pain and decreased urine volume.  Musculoskeletal: Negative for myalgias and joint swelling.  Skin: Negative for color change and wound.  Neurological: Negative for syncope and numbness.  Hematological: Negative for adenopathy.  Psychiatric/Behavioral: Negative for hallucinations, self-injury, decreased concentration and agitation.      Objective:   Physical Exam BP 128/90  Pulse 62  Temp(Src) 97.5 F (36.4 C) (Oral)  Ht 5\' 2"  (1.575 m)  Wt 238 lb (107.956 kg)  BMI 43.53 kg/m2  SpO2 97% Physical Exam  VS noted, morbid obese Constitutional: Pt is oriented to person,  place, and time.Marland Kitchen  HENT:  Head: Normocephalic and atraumatic.  Right Ear: External ear normal.  Left Ear: External ear normal.  Nose: Nose normal.  Mouth/Throat: Oropharynx is clear and moist.  Eyes: Conjunctivae and EOM are normal. Pupils are equal, round, and reactive to light.  Neck: Normal range of motion. Neck  supple. No JVD present. No tracheal deviation present.  Cardiovascular: Normal rate, regular rhythm, normal heart sounds and intact distal pulses.   Pulmonary/Chest: Effort normal and breath sounds normal.  Abdominal: Soft. Bowel sounds are normal. There is no tenderness.  Musculoskeletal: Normal range of motion. Exhibits 1+ edema bilat Lymphadenopathy:  Has no cervical adenopathy.  Neurological: Pt is alert and oriented to person, place, and time. Pt has normal reflexes. No cranial nerve deficit.  Skin: Skin is warm and dry. No rash noted.  Psychiatric:  Has  normal mood and affect. Behavior is normal. 1-2+ nervous        Assessment & Plan:

## 2010-09-24 NOTE — Patient Instructions (Addendum)
OK to stop the HCTZ fluid pill, and the zoloft Take all new medications as prescribed - the lasix, protonix, and lexapro (all generic) All medications for 90 days sent to the pharmacy except for the xanax You are given the advair sample today as you requested You will be contacted regarding the referral for: GI (Dr Juanda Chance) Andrea Perry had the tetanus shot today Please return in 1 year for your yearly visit, or sooner if needed, with Lab testing done 3-5 days before

## 2010-09-24 NOTE — Assessment & Plan Note (Signed)
Uncontrolled - to change the zoloft to lexapro 10 qd,  to f/u any worsening symptoms or concerns

## 2010-09-25 ENCOUNTER — Encounter: Payer: Self-pay | Admitting: Gastroenterology

## 2010-09-28 ENCOUNTER — Encounter: Payer: Self-pay | Admitting: Internal Medicine

## 2010-09-28 NOTE — Assessment & Plan Note (Addendum)
Mild uncontrolled - to change hctz to lasix,  to f/u any worsening symptoms or concerns

## 2010-09-28 NOTE — Assessment & Plan Note (Signed)
Mild intermittent, painless - for GI evaluation

## 2010-09-28 NOTE — Assessment & Plan Note (Signed)

## 2010-09-28 NOTE — Assessment & Plan Note (Signed)
Mild, for protonix trial

## 2010-11-04 ENCOUNTER — Ambulatory Visit (INDEPENDENT_AMBULATORY_CARE_PROVIDER_SITE_OTHER): Payer: Medicare Other | Admitting: Gastroenterology

## 2010-11-04 ENCOUNTER — Encounter: Payer: Self-pay | Admitting: Gastroenterology

## 2010-11-04 DIAGNOSIS — K921 Melena: Secondary | ICD-10-CM

## 2010-11-04 DIAGNOSIS — K644 Residual hemorrhoidal skin tags: Secondary | ICD-10-CM | POA: Insufficient documentation

## 2010-11-04 DIAGNOSIS — K625 Hemorrhage of anus and rectum: Secondary | ICD-10-CM | POA: Insufficient documentation

## 2010-11-04 DIAGNOSIS — K645 Perianal venous thrombosis: Secondary | ICD-10-CM | POA: Insufficient documentation

## 2010-11-04 DIAGNOSIS — K648 Other hemorrhoids: Secondary | ICD-10-CM | POA: Insufficient documentation

## 2010-11-04 MED ORDER — PEG-KCL-NACL-NASULF-NA ASC-C 100 G PO SOLR: 1.0000 | Freq: Once | ORAL | Status: DC

## 2010-11-04 MED ORDER — HYDROCORTISONE 2.5 % RE CREA: TOPICAL_CREAM | RECTAL | Status: AC

## 2010-11-04 NOTE — Assessment & Plan Note (Signed)
This most likely is due to hemorrhoidal bleeding. A more proximal colonic bleeding source should be ruled out.  Recommendations #1 colonoscopy  Risks, alternatives, and complications of the procedure, including bleeding, perforation, and possible need for surgery, were explained to the patient.  Patient's questions were answered.

## 2010-11-04 NOTE — Patient Instructions (Signed)
Hemorrhoids Hemorrhoids are dilated (enlarged) veins around the rectum. Sometimes clots will form in the veins. This makes them swollen and painful. These are called thrombosed hemorrhoids. Causes of hemorrhoids include:  Pregnancy: this increases the pressure in the hemorrhoidal veins.   Constipation.   Straining to have a bowel movement.  HOME CARE INSTRUCTIONS  Eat a well balanced diet and drink 6 to 8 glasses of water every day to avoid constipation. You may also use a bulk laxative.   Avoid straining to have bowel movements.   Keep anal area dry and clean.   Only take over-the-counter or prescription medicines for pain, discomfort, or fever as directed by your caregiver.  If thrombosed:  Take hot sitz baths for 20 to 30 minutes, 3 to 4 times per day.   If the hemorrhoids are very tender and swollen, place ice packs on area as tolerated. Using ice packs between sitz baths may be helpful. Fill a plastic bag with ice and use a towel between the bag of ice and your skin.   Special creams and suppositories (Anusol, Nupercainal, Wyanoids) may be used or applied as directed.   Do not use a donut shaped pillow or sit on the toilet for long periods. This increases blood pooling and pain.   Move your bowels when your body has the urge; this will require less straining and will decrease pain and pressure.   Only take over-the-counter or prescription medicines for pain, discomfort, or fever as directed by your caregiver.  SEEK MEDICAL CARE IF:  You have increasing pain and swelling that is not controlled with your prescription.   You have uncontrolled bleeding.   You have an inability or difficulty having a bowel movement.   You have pain or inflammation outside the area of the hemorrhoids.   You have chills and/or an oral temperature above 100 that lasts for 2 days or longer, or as your caregiver suggests.  MAKE SURE YOU:   Understand these instructions.   Will watch your  condition.   Will get help right away if you are not doing well or get worse.  Document Released: 04/03/2000 Document Re-Released: 03/19/2008 [employer] Patient Information 2011 [employer]. 

## 2010-11-04 NOTE — Progress Notes (Signed)
History of Present Illness:  Andrea Perry is a pleasant 60 year old white female referred at the request of Dr. Jonny Ruiz for evaluation of rectal bleeding. On multiple occasions she has seen blood on the toilet tissue and discoloring the water. She has had mild rectal itching and has a history of hemorrhoids. There's been no change in bowel habits. She denies abdominal pain. Colonoscopy in 2004 showed small hyperplastic polyps.    Review of Systems: She has frequent low back pain Pertinent positive and negative review of systems were noted in the above HPI section. All other review of systems were otherwise negative.    Current Medications, Allergies, Past Medical History, Past Surgical History, Family History and Social History were reviewed in [employer] electronic medical record  Vital signs were reviewed in today's medical record. Physical Exam: General: Well developed , well nourished, no acute distress Head: Normocephalic and atraumatic Eyes:  sclerae anicteric, EOMI Ears: Normal auditory acuity Mouth: No deformity or lesions Lungs: Clear throughout to auscultation Heart: Regular rate and rhythm; no murmurs, rubs or bruits Abdomen: Soft, non tender and non distended. No masses, hepatosplenomegaly or hernias noted. Normal Bowel sounds Rectal: There are grade 4 hemorrhoids. Musculoskeletal: Symmetrical with no gross deformities  Pulses:  Normal pulses noted Extremities: No clubbing, cyanosis, edema or deformities noted Neurological: Alert oriented x 4, grossly nonfocal Psychological:  Alert and cooperative. Normal mood and affect

## 2010-11-04 NOTE — Assessment & Plan Note (Addendum)
Patient has symptomatic grade 4 hemorrhoids.  Recommendations #1 Anusol-HC cream (pt is unable to insert suppository) #2 warm soaks

## 2010-11-05 ENCOUNTER — Encounter: Payer: Self-pay | Admitting: Internal Medicine

## 2010-11-05 ENCOUNTER — Encounter: Payer: Self-pay | Admitting: Gastroenterology

## 2010-11-17 ENCOUNTER — Encounter: Payer: Self-pay | Admitting: Gastroenterology

## 2010-11-17 ENCOUNTER — Ambulatory Visit (AMBULATORY_SURGERY_CENTER): Payer: Medicare Other | Admitting: Gastroenterology

## 2010-11-17 DIAGNOSIS — K921 Melena: Secondary | ICD-10-CM

## 2010-11-17 DIAGNOSIS — K645 Perianal venous thrombosis: Secondary | ICD-10-CM

## 2010-11-17 DIAGNOSIS — K648 Other hemorrhoids: Secondary | ICD-10-CM

## 2010-11-17 DIAGNOSIS — K625 Hemorrhage of anus and rectum: Secondary | ICD-10-CM

## 2010-11-17 MED ORDER — SODIUM CHLORIDE 0.9 % IV SOLN: 500.0000 mL | INTRAVENOUS | Status: DC

## 2010-11-17 NOTE — Patient Instructions (Signed)
Please refer to your blue and neon green sheets regarding diet and activity for the rest of today. You may resume your medications as you would normally take them. Repeat exam in 10 years.

## 2010-11-18 ENCOUNTER — Telehealth: Payer: Self-pay | Admitting: *Deleted

## 2010-11-18 NOTE — Telephone Encounter (Signed)

## 2010-12-05 ENCOUNTER — Other Ambulatory Visit: Payer: Self-pay | Admitting: Obstetrics and Gynecology

## 2010-12-10 ENCOUNTER — Other Ambulatory Visit: Payer: Self-pay

## 2010-12-10 DIAGNOSIS — R1013 Epigastric pain: Secondary | ICD-10-CM

## 2010-12-10 MED ORDER — PANTOPRAZOLE SODIUM 40 MG PO TBEC: 40.0000 mg | DELAYED_RELEASE_TABLET | Freq: Every day | ORAL | Status: DC

## 2010-12-18 ENCOUNTER — Telehealth: Payer: Self-pay

## 2010-12-18 NOTE — Telephone Encounter (Signed)
Needs OV please.

## 2010-12-18 NOTE — Telephone Encounter (Signed)
Pt advised and will call back to schedule appt

## 2010-12-18 NOTE — Telephone Encounter (Signed)
Pt called requesting to increase Diuretic to 40 mg bid prn. Pt currently takes 20 mg qd and is does not help with swelling. She often takes 40 mg with mild improvement until she takes 40 mg more. Please advise, okay to increase?

## 2011-01-09 ENCOUNTER — Other Ambulatory Visit: Payer: Self-pay

## 2011-01-09 MED ORDER — ALPRAZOLAM 1 MG PO TABS: 1.0000 mg | ORAL_TABLET | Freq: Two times a day (BID) | ORAL | Status: DC

## 2011-01-09 NOTE — Telephone Encounter (Signed)
Done hardcopy to robin  

## 2011-01-09 NOTE — Telephone Encounter (Signed)
Rx faxed to pharmacy  

## 2011-02-04 LAB — BASIC METABOLIC PANEL
CO2: 26
Calcium: 8.5
Creatinine, Ser: 0.53
GFR calc Af Amer: >60

## 2011-02-04 LAB — POCT HEMOGLOBIN-HEMACUE: Hemoglobin: 13.8

## 2011-02-25 ENCOUNTER — Encounter: Payer: Self-pay | Admitting: Internal Medicine

## 2011-02-25 ENCOUNTER — Ambulatory Visit (INDEPENDENT_AMBULATORY_CARE_PROVIDER_SITE_OTHER): Payer: Medicare Other | Admitting: Internal Medicine

## 2011-02-25 VITALS — BP 132/80 | HR 77 | Temp 98.8°F | Ht 63.0 in | Wt 248.5 lb

## 2011-02-25 DIAGNOSIS — I10 Essential (primary) hypertension: Secondary | ICD-10-CM

## 2011-02-25 DIAGNOSIS — J209 Acute bronchitis, unspecified: Secondary | ICD-10-CM

## 2011-02-25 DIAGNOSIS — R600 Localized edema: Secondary | ICD-10-CM

## 2011-02-25 DIAGNOSIS — J441 Chronic obstructive pulmonary disease with (acute) exacerbation: Secondary | ICD-10-CM

## 2011-02-25 DIAGNOSIS — R609 Edema, unspecified: Secondary | ICD-10-CM

## 2011-02-25 MED ORDER — METHYLPREDNISOLONE ACETATE 80 MG/ML IJ SUSP
120.0000 mg | Freq: Once | INTRAMUSCULAR | Status: AC
  Administered 2011-02-25: 120 mg via INTRAMUSCULAR

## 2011-02-25 MED ORDER — HYDROCODONE-HOMATROPINE 5-1.5 MG/5ML PO SYRP: 5.0000 mL | ORAL_SOLUTION | Freq: Four times a day (QID) | ORAL | Status: AC | PRN

## 2011-02-25 MED ORDER — FUROSEMIDE 40 MG PO TABS: 40.0000 mg | ORAL_TABLET | Freq: Every day | ORAL | Status: DC

## 2011-02-25 MED ORDER — LEVOFLOXACIN 500 MG PO TABS: 500.0000 mg | ORAL_TABLET | Freq: Every day | ORAL | Status: AC

## 2011-02-25 MED ORDER — PREDNISONE 10 MG PO TABS: ORAL_TABLET | ORAL | Status: DC

## 2011-02-25 NOTE — Assessment & Plan Note (Signed)
Mild to mod, for antibx course,  to f/u any worsening symptoms or concerns 

## 2011-02-25 NOTE — Assessment & Plan Note (Signed)
Mild to mod, for depomedrol and predpack asd,  to f/u any worsening symptoms or concerns 

## 2011-02-25 NOTE — Assessment & Plan Note (Signed)
stable overall by hx and exam, most recent data reviewed with pt, and pt to continue medical treatment as before  BP Readings from Last 3 Encounters:  02/25/11 132/80  11/17/10 155/84  11/04/10 134/76

## 2011-02-25 NOTE — Assessment & Plan Note (Signed)
Overall mild persistent - to increase the lasix to 40 mg daily,  to f/u any worsening symptoms or concerns

## 2011-02-25 NOTE — Patient Instructions (Addendum)
You had the steroid shot today Take all new medications as prescribed, including the antibiotic, cough medicine, prednisone, and increased lasix to 40 mg per day Continue all other medications as before

## 2011-03-01 ENCOUNTER — Encounter: Payer: Self-pay | Admitting: Internal Medicine

## 2011-03-01 NOTE — Progress Notes (Signed)
Subjective:    Patient ID: Andrea Perry, female    DOB: [birth year], 60 y.o.   MRN: [id number]  HPI  Here with acute onset mild to mod 2-3 days ST, HA, general weakness and malaise, with prod cough greenish sputum, but Pt denies chest pain, increased sob or doe, wheezing, orthopnea, PND, increased LE swelling, palpitations, dizziness or syncope, except for onset wheezing today. Pt denies new neurological symptoms such as new headache, or facial or extremity weakness or numbness   Pt denies polydipsia, polyuria.  Denies worsening depressive symptoms, suicidal ideation, or panic, though has ongoing anxiety, not increased recently.  Past Medical History  Diagnosis Date  . Impaired glucose tolerance 09/21/2010  . ALLERGIC RHINITIS 08/16/2008  . ANXIETY 08/16/2008  . ASTHMA 08/16/2008  . COPD 08/16/2008  . DEPRESSION 08/16/2008  . GENITAL HERPES 12/03/2006  . HEMATOCHEZIA 06/20/2009  . HYPERLIPIDEMIA 08/16/2008  . HYPERTENSION 12/03/2006  . HYPOTHYROIDISM 12/03/2006  . IBS 06/20/2009  . Arthritis   . Hemorrhoids    Past Surgical History  Procedure Date  . Appendectomy   . Knee surgery     Right   . Tonsillectomy and adenoidectomy   . Abdominal hysterectomy   . Carpal tunnel release     Right   . Cesarean section     x 1   . Colonoscopy 2004    reports that she quit smoking about 7 years ago. She has never used smokeless tobacco. She reports that she drinks alcohol. She reports that she does not use illicit drugs. family history includes Colon polyps in her mother; Diabetes in her mother; Hyperlipidemia in her mother; Hypertension in her mother; and Stroke in her father.  There is no history of Colon cancer. Allergies  Allergen Reactions  . Tramadol Hcl     REACTION: mouth dryness, headache  . Vicodin (Hydrocodone-Acetaminophen)    Current Outpatient Prescriptions on File Prior to Visit  Medication Sig Dispense Refill  . albuterol (VENTOLIN HFA) 108 (90 BASE) MCG/ACT inhaler Inhale 2 puffs  into the lungs every 6 (six) hours as needed.  3 Inhaler  3  . ALPRAZolam (XANAX) 1 MG tablet Take 1 tablet (1 mg total) by mouth 2 (two) times daily.  60 tablet  2  . aspirin 81 MG EC tablet Take 81 mg by mouth daily.        . cetirizine (ZYRTEC) 10 MG tablet Take 10 mg by mouth daily.        . diclofenac (VOLTAREN) 75 MG EC tablet Take 1 tablet (75 mg total) by mouth 2 (two) times daily.  180 tablet  3  . estradiol (ESTRACE) 2 MG tablet Take 1 tablet (2 mg total) by mouth daily.  90 tablet  3  . Fluticasone-Salmeterol (ADVAIR DISKUS) 250-50 MCG/DOSE AEPB Inhale 1 puff into the lungs 2 (two) times daily.  3 each  3  . Garlic 1000 MG CAPS Take by mouth daily.        . hydrocortisone (ANUSOL-HC) 2.5 % rectal cream Apply rectally 2 times daily  30 g  1  . hydrocortisone (ANUSOL-HC) 25 MG suppository Place 1 suppository (25 mg total) rectally 2 (two) times daily as needed.  12 suppository  1  . ibuprofen (ADVIL,MOTRIN) 800 MG tablet Take 1 tablet by mouth Every 6 hours as needed.      . metoprolol (LOPRESSOR) 50 MG tablet Take 1 tablet (50 mg total) by mouth 2 (two) times daily.  180 tablet  3  . Multiple  Vitamin (MULTIVITAMIN) tablet Take 1 tablet by mouth daily.        . Omega-3 Fatty Acids (FISH OIL) 1200 MG CAPS Take by mouth daily.        . pantoprazole (PROTONIX) 40 MG tablet Take 1 tablet (40 mg total) by mouth daily.  90 tablet  2  . Potassium Gluconate 550 MG TABS Take by mouth daily.        . pravastatin (PRAVACHOL) 40 MG tablet Take 1 tablet (40 mg total) by mouth daily.  90 tablet  3   Current Facility-Administered Medications on File Prior to Visit  Medication Dose Route Frequency Provider Last Rate Last Dose  . 0.9 %  sodium chloride infusion  500 mL Intravenous Continuous Louis Meckel, MD       Review of Systems Review of Systems  Constitutional: Negative for diaphoresis and unexpected weight change.  HENT: Negative for drooling and tinnitus.   Eyes: Negative for photophobia  and visual disturbance.  Respiratory: Negative for choking and stridor.   Gastrointestinal: Negative for vomiting and blood in stool.  Genitourinary: Negative for hematuria and decreased urine volume.      Objective:   Physical Exam BP 132/80  Pulse 77  Temp(Src) 98.8 F (37.1 C) (Oral)  Ht 5\' 3"  (1.6 m)  Wt 248 lb 8 oz (112.719 kg)  BMI 44.02 kg/m2  SpO2 95% Physical Exam  VS noted, mild ill Constitutional: Pt appears well-developed and well-nourished.  HENT: Head: Normocephalic.  Right Ear: External ear normal.  Left Ear: External ear normal.  Bilat tm's mild erythema.  Sinus nontender.  Pharynx mild erythema Eyes: Conjunctivae and EOM are normal. Pupils are equal, round, and reactive to light.  Neck: Normal range of motion. Neck supple.  Cardiovascular: Normal rate and regular rhythm.   Pulmonary/Chest: Effort normal and breath sounds decreased bilat with mild wheeze.  Neurological: Pt is alert. No cranial nerve deficit.  Skin: Skin is warm. No erythema.  trace edema bilat pedal Psychiatric: Pt behavior is normal. Thought content normal.      Assessment & Plan:

## 2011-03-26 ENCOUNTER — Other Ambulatory Visit: Payer: Self-pay | Admitting: Obstetrics and Gynecology

## 2011-05-05 ENCOUNTER — Ambulatory Visit (INDEPENDENT_AMBULATORY_CARE_PROVIDER_SITE_OTHER): Payer: Medicare Other | Admitting: Internal Medicine

## 2011-05-05 ENCOUNTER — Ambulatory Visit: Payer: Medicare Other | Admitting: Internal Medicine

## 2011-05-05 ENCOUNTER — Encounter: Payer: Self-pay | Admitting: Internal Medicine

## 2011-05-05 VITALS — BP 140/90 | HR 73 | Temp 98.9°F | Ht 62.0 in | Wt 257.5 lb

## 2011-05-05 DIAGNOSIS — J209 Acute bronchitis, unspecified: Secondary | ICD-10-CM

## 2011-05-05 DIAGNOSIS — J441 Chronic obstructive pulmonary disease with (acute) exacerbation: Secondary | ICD-10-CM

## 2011-05-05 DIAGNOSIS — I10 Essential (primary) hypertension: Secondary | ICD-10-CM

## 2011-05-05 DIAGNOSIS — R7302 Impaired glucose tolerance (oral): Secondary | ICD-10-CM

## 2011-05-05 DIAGNOSIS — R7309 Other abnormal glucose: Secondary | ICD-10-CM

## 2011-05-05 MED ORDER — HYDROCODONE-HOMATROPINE 5-1.5 MG/5ML PO SYRP: 5.0000 mL | ORAL_SOLUTION | Freq: Four times a day (QID) | ORAL | Status: AC | PRN

## 2011-05-05 MED ORDER — METHYLPREDNISOLONE ACETATE 80 MG/ML IJ SUSP
120.0000 mg | Freq: Once | INTRAMUSCULAR | Status: AC
  Administered 2011-05-05: 120 mg via INTRAMUSCULAR

## 2011-05-05 MED ORDER — PREDNISONE 10 MG PO TABS: ORAL_TABLET | ORAL | Status: DC

## 2011-05-05 MED ORDER — AZITHROMYCIN 250 MG PO TABS: ORAL_TABLET | ORAL | Status: AC

## 2011-05-05 NOTE — Progress Notes (Signed)
Subjective:    Patient ID: Andrea Perry, female    DOB: March 15, 1951, 61 y.o.   MRN: [id number]  HPI   Here with 3 days acute onset fever, facial pain, pressure, general weakness and malaise, and greenish d/c, with slight ST, but little to no cough and Pt denies chest pain, increased sob or doe, wheezing, orthopnea, PND, increased LE swelling, palpitations, dizziness or syncope, until onset midl wheezing/sob in the past 1-2 days.  Pt denies chest pain, increased sob or doe, wheezing, orthopnea, PND, increased LE swelling, palpitations, dizziness or syncope.  Pt denies new neurological symptoms such as new headache, or facial or extremity weakness or numbness   Pt denies polydipsia, polyuria,  Pt states overall good compliance with meds, trying to follow lower cholesterol diet Past Medical History  Diagnosis Date  . Impaired glucose tolerance 09/21/2010  . ALLERGIC RHINITIS 08/16/2008  . ANXIETY 08/16/2008  . ASTHMA 08/16/2008  . COPD 08/16/2008  . DEPRESSION 08/16/2008  . GENITAL HERPES 12/03/2006  . HEMATOCHEZIA 06/20/2009  . HYPERLIPIDEMIA 08/16/2008  . HYPERTENSION 12/03/2006  . HYPOTHYROIDISM 12/03/2006  . IBS 06/20/2009  . Arthritis   . Hemorrhoids    Past Surgical History  Procedure Date  . Appendectomy   . Knee surgery     Right   . Tonsillectomy and adenoidectomy   . Abdominal hysterectomy   . Carpal tunnel release     Right   . Cesarean section     x 1   . Colonoscopy 2004    reports that she quit smoking about 8 years ago. She has never used smokeless tobacco. She reports that she drinks alcohol. She reports that she does not use illicit drugs. family history includes Colon polyps in her mother; Diabetes in her mother; Hyperlipidemia in her mother; Hypertension in her mother; and Stroke in her father.  There is no history of Colon cancer. Allergies  Allergen Reactions  . Tramadol Hcl     REACTION: mouth dryness, headache  . Vicodin (Hydrocodone-Acetaminophen)    Current  Outpatient Prescriptions on File Prior to Visit  Medication Sig Dispense Refill  . albuterol (VENTOLIN HFA) 108 (90 BASE) MCG/ACT inhaler Inhale 2 puffs into the lungs every 6 (six) hours as needed.  3 Inhaler  3  . ALPRAZolam (XANAX) 1 MG tablet Take 1 tablet (1 mg total) by mouth 2 (two) times daily.  60 tablet  2  . aspirin 81 MG EC tablet Take 81 mg by mouth daily.        . cetirizine (ZYRTEC) 10 MG tablet Take 10 mg by mouth daily.        . diclofenac (VOLTAREN) 75 MG EC tablet Take 1 tablet (75 mg total) by mouth 2 (two) times daily.  180 tablet  3  . estradiol (ESTRACE) 2 MG tablet Take 1 tablet (2 mg total) by mouth daily.  90 tablet  3  . Fluticasone-Salmeterol (ADVAIR DISKUS) 250-50 MCG/DOSE AEPB Inhale 1 puff into the lungs 2 (two) times daily.  3 each  3  . furosemide (LASIX) 40 MG tablet Take 1 tablet (40 mg total) by mouth daily.  90 tablet  3  . Garlic 1000 MG CAPS Take by mouth daily.        . hydrocortisone (ANUSOL-HC) 2.5 % rectal cream Apply rectally 2 times daily  30 g  1  . hydrocortisone (ANUSOL-HC) 25 MG suppository Place 1 suppository (25 mg total) rectally 2 (two) times daily as needed.  12 suppository  1  .  ibuprofen (ADVIL,MOTRIN) 800 MG tablet Take 1 tablet by mouth Every 6 hours as needed.      . metoprolol (LOPRESSOR) 50 MG tablet Take 1 tablet (50 mg total) by mouth 2 (two) times daily.  180 tablet  3  . Multiple Vitamin (MULTIVITAMIN) tablet Take 1 tablet by mouth daily.        . Omega-3 Fatty Acids (FISH OIL) 1200 MG CAPS Take by mouth daily.        . pantoprazole (PROTONIX) 40 MG tablet Take 1 tablet (40 mg total) by mouth daily.  90 tablet  2  . Potassium Gluconate 550 MG TABS Take by mouth daily.        . pravastatin (PRAVACHOL) 40 MG tablet Take 1 tablet (40 mg total) by mouth daily.  90 tablet  3   Current Facility-Administered Medications on File Prior to Visit  Medication Dose Route Frequency Provider Last Rate Last Dose  . 0.9 %  sodium chloride  infusion  500 mL Intravenous Continuous Louis Meckel, MD        Review of Systems Review of Systems  Constitutional: Negative for diaphoresis and unexpected weight change.  HENT: Negative for drooling and tinnitus.   Eyes: Negative for photophobia and visual disturbance.  Respiratory: Negative for choking and stridor.   Gastrointestinal: Negative for vomiting and blood in stool.  Genitourinary: Negative for hematuria and decreased urine volume.    Objective:   Physical Exam BP 140/90  Pulse 73  Temp(Src) 98.9 F (37.2 C) (Oral)  Ht 5\' 2"  (1.575 m)  Wt 257 lb 8 oz (116.801 kg)  BMI 47.10 kg/m2  SpO2 96% Physical Exam  VS noted, mild ill Constitutional: Pt appears well-developed and well-nourished.  HENT: Head: Normocephalic.  Right Ear: External ear normal.  Left Ear: External ear normal.  Bilat tm's mild erythema.  Sinus tender.  Pharynx mild erythema Eyes: Conjunctivae and EOM are normal. Pupils are equal, round, and reactive to light.  Neck: Normal range of motion. Neck supple.  Cardiovascular: Normal rate and regular rhythm.   Pulmonary/Chest: Effort normal and breath sounds mild decreased with bilat wheeze mild  Neurological: Pt is alert. No cranial nerve deficit.  Skin: Skin is warm. No erythema.  Psychiatric: Pt behavior is normal. Thought content normal.     Assessment & Plan:

## 2011-05-05 NOTE — Assessment & Plan Note (Signed)
stable overall by hx and exam, most recent data reviewed with pt, and pt to continue medical treatment as before BP Readings from Last 3 Encounters:  05/05/11 140/90  02/25/11 132/80  11/17/10 155/84

## 2011-05-05 NOTE — Assessment & Plan Note (Signed)
Mild to mod, for antibx course,  to f/u any worsening symptoms or concerns 

## 2011-05-05 NOTE — Patient Instructions (Signed)
You had the steroid shot today Take all new medications as prescribed Continue all other medications as before  

## 2011-05-05 NOTE — Assessment & Plan Note (Signed)
Mild to mod, for depomedrol IM, and predpack course,  to f/u any worsening symptoms or concerns 

## 2011-05-05 NOTE — Assessment & Plan Note (Signed)
stable overall by hx and exam, most recent data reviewed with pt, and pt to continue medical treatment as before Lab Results  Component Value Date   WBC 8.7 09/24/2010   HGB 12.3 09/24/2010   HCT 36.1 09/24/2010   PLT 266.0 09/24/2010   GLUCOSE 119* 09/24/2010   CHOL 150 09/24/2010   TRIG 305.0* 09/24/2010   HDL 39.50 09/24/2010   LDLDIRECT 84.1 09/24/2010   LDLCALC 91 08/13/2006   ALT 20 09/24/2010   AST 17 09/24/2010   NA 138 09/24/2010   K 5.2* 09/24/2010   CL 103 09/24/2010   CREATININE 0.6 09/24/2010   BUN 19 09/24/2010   CO2 29 09/24/2010   TSH 2.86 09/24/2010   HGBA1C 6.3 09/24/2010

## 2011-06-02 ENCOUNTER — Other Ambulatory Visit: Payer: Self-pay | Admitting: Dermatology

## 2011-07-07 ENCOUNTER — Ambulatory Visit (INDEPENDENT_AMBULATORY_CARE_PROVIDER_SITE_OTHER): Payer: Medicare Other | Admitting: Internal Medicine

## 2011-07-07 ENCOUNTER — Encounter: Payer: Self-pay | Admitting: Internal Medicine

## 2011-07-07 ENCOUNTER — Other Ambulatory Visit (INDEPENDENT_AMBULATORY_CARE_PROVIDER_SITE_OTHER): Payer: Medicare Other

## 2011-07-07 VITALS — BP 140/92 | HR 68 | Temp 98.9°F | Ht 62.0 in | Wt 252.4 lb

## 2011-07-07 DIAGNOSIS — J209 Acute bronchitis, unspecified: Secondary | ICD-10-CM

## 2011-07-07 DIAGNOSIS — M79606 Pain in leg, unspecified: Secondary | ICD-10-CM | POA: Insufficient documentation

## 2011-07-07 DIAGNOSIS — I10 Essential (primary) hypertension: Secondary | ICD-10-CM

## 2011-07-07 DIAGNOSIS — N959 Unspecified menopausal and perimenopausal disorder: Secondary | ICD-10-CM | POA: Insufficient documentation

## 2011-07-07 DIAGNOSIS — M79609 Pain in unspecified limb: Secondary | ICD-10-CM

## 2011-07-07 DIAGNOSIS — Z0000 Encounter for general adult medical examination without abnormal findings: Secondary | ICD-10-CM

## 2011-07-07 DIAGNOSIS — R7302 Impaired glucose tolerance (oral): Secondary | ICD-10-CM

## 2011-07-07 DIAGNOSIS — R7309 Other abnormal glucose: Secondary | ICD-10-CM

## 2011-07-07 DIAGNOSIS — J441 Chronic obstructive pulmonary disease with (acute) exacerbation: Secondary | ICD-10-CM

## 2011-07-07 DIAGNOSIS — R42 Dizziness and giddiness: Secondary | ICD-10-CM | POA: Insufficient documentation

## 2011-07-07 LAB — URINALYSIS, ROUTINE W REFLEX MICROSCOPIC
Bilirubin Urine: NEGATIVE
Ketones, ur: NEGATIVE
Leukocytes, UA: NEGATIVE
Urine Glucose: NEGATIVE
pH: 7 (ref 5.0–8.0)

## 2011-07-07 LAB — HEMOGLOBIN A1C: Hgb A1c MFr Bld: 6.1 % (ref 4.6–6.5)

## 2011-07-07 MED ORDER — AZITHROMYCIN 250 MG PO TABS: ORAL_TABLET | ORAL | Status: AC

## 2011-07-07 MED ORDER — HYDROCODONE-HOMATROPINE 5-1.5 MG/5ML PO SYRP: 5.0000 mL | ORAL_SOLUTION | Freq: Four times a day (QID) | ORAL | Status: AC | PRN

## 2011-07-07 MED ORDER — PREDNISONE 10 MG PO TABS: ORAL_TABLET | ORAL | Status: DC

## 2011-07-07 NOTE — Patient Instructions (Addendum)
Take all new medications as prescribed - antibiotic, cough med, and prednisone (all sent except for the cough medicine) Continue all other medications as before, including the advair and inhaler Please go to LAB in the Basement for the blood and/or urine tests to be done today You will be contacted regarding the referral for: carotid dopplers, and leg arterial dopplers You will be contacted by phone if any changes need to be made immediately.  Otherwise, you will receive a letter about your results with an explanation. Please see the scheduler for the bone density scheduling as you leave Please return in 3 mo with Lab testing done 3-5 days before

## 2011-07-08 ENCOUNTER — Encounter: Payer: Self-pay | Admitting: Internal Medicine

## 2011-07-08 LAB — BASIC METABOLIC PANEL
BUN: 8 mg/dL (ref 6–23)
Chloride: 103 mEq/L (ref 96–112)
Potassium: 4.9 mEq/L (ref 3.5–5.1)
Sodium: 143 mEq/L (ref 135–145)

## 2011-07-12 ENCOUNTER — Encounter: Payer: Self-pay | Admitting: Internal Medicine

## 2011-07-12 NOTE — Assessment & Plan Note (Signed)
Mild to mod, for antibx course,  to f/u any worsening symptoms or concerns 

## 2011-07-12 NOTE — Assessment & Plan Note (Signed)
Mild to mod, for antibx course, cough med and predpack,  to f/u any worsening symptoms or concerns

## 2011-07-12 NOTE — Assessment & Plan Note (Signed)
Also for carotid dopplers,  to f/u any worsening symptoms or concerns

## 2011-07-12 NOTE — Assessment & Plan Note (Signed)
stable overall by hx and exam, most recent data reviewed with pt, and pt to continue medical treatment as before  BP Readings from Last 3 Encounters:  07/07/11 140/92  05/05/11 140/90  02/25/11 132/80

## 2011-07-12 NOTE — Assessment & Plan Note (Signed)
Asymtp, stable overall by hx and exam, most recent data reviewed with pt, and pt to continue medical treatment as before  Lab Results  Component Value Date   HGBA1C 6.1 07/07/2011

## 2011-07-12 NOTE — Progress Notes (Signed)
Subjective:    Patient ID: Andrea Perry, female    DOB: [birth year], 61 y.o.   MRN: [id number]  HPI    Here for wellness and f/u;  Overall doing ok;  Pt denies CP, worsening SOB, DOE, wheezing, orthopnea, PND, worsening LE edema, palpitations, or syncope.  Pt denies polydipsia, polyuria, or low sugar symptoms. Pt states overall good compliance with treatment and medications, good tolerability, and trying to follow lower cholesterol diet.  Pt denies worsening depressive symptoms, suicidal ideation or panic. No fever, wt loss, night sweats, loss of appetite, or other constitutional symptoms.  Pt states good ability with ADL's, low fall risk, home safety reviewed and adequate, no significant changes in hearing or vision, and occasionally active with exercise.  Also with acute onset mild to mod 2-3 days ST, HA, general weakness and malaise, with prod cough greenish sputum, but Pt denies chest pain, increased sob or doe, wheezing, except for onset mild wheezing today.  Pt denies new neurological symptoms such as new headache, or facial or extremity weakness or numbness, except for reurrent dizziness/lightheaded, and recurrent bilat leg pain with ambulation suggestive of claudication.  Due for bone density f/u - post menopausal. Past Medical History  Diagnosis Date  . Impaired glucose tolerance 09/21/2010  . ALLERGIC RHINITIS 08/16/2008  . ANXIETY 08/16/2008  . ASTHMA 08/16/2008  . COPD 08/16/2008  . DEPRESSION 08/16/2008  . GENITAL HERPES 12/03/2006  . HEMATOCHEZIA 06/20/2009  . HYPERLIPIDEMIA 08/16/2008  . HYPERTENSION 12/03/2006  . HYPOTHYROIDISM 12/03/2006  . IBS 06/20/2009  . Arthritis   . Hemorrhoids    Past Surgical History  Procedure Date  . Appendectomy   . Knee surgery     Right   . Tonsillectomy and adenoidectomy   . Abdominal hysterectomy   . Carpal tunnel release     Right   . Cesarean section     x 1   . Colonoscopy 2004    reports that she quit smoking about 8 years ago. She has never  used smokeless tobacco. She reports that she drinks alcohol. She reports that she does not use illicit drugs. family history includes Colon polyps in her mother; Diabetes in her mother; Hyperlipidemia in her mother; Hypertension in her mother; and Stroke in her father.  There is no history of Colon cancer. Allergies  Allergen Reactions  . Tramadol Hcl     REACTION: mouth dryness, headache  . Vicodin (Hydrocodone-Acetaminophen)    Current Outpatient Prescriptions on File Prior to Visit  Medication Sig Dispense Refill  . albuterol (VENTOLIN HFA) 108 (90 BASE) MCG/ACT inhaler Inhale 2 puffs into the lungs every 6 (six) hours as needed.  3 Inhaler  3  . ALPRAZolam (XANAX) 1 MG tablet Take 1 tablet (1 mg total) by mouth 2 (two) times daily.  60 tablet  2  . aspirin 81 MG EC tablet Take 81 mg by mouth daily.        . cetirizine (ZYRTEC) 10 MG tablet Take 10 mg by mouth daily.        . diclofenac (VOLTAREN) 75 MG EC tablet Take 1 tablet (75 mg total) by mouth 2 (two) times daily.  180 tablet  3  . estradiol (ESTRACE) 2 MG tablet Take 1 tablet (2 mg total) by mouth daily.  90 tablet  3  . Fluticasone-Salmeterol (ADVAIR DISKUS) 250-50 MCG/DOSE AEPB Inhale 1 puff into the lungs 2 (two) times daily.  3 each  3  . furosemide (LASIX) 40 MG tablet Take 1  tablet (40 mg total) by mouth daily.  90 tablet  3  . Garlic 1000 MG CAPS Take by mouth daily.        . hydrocortisone (ANUSOL-HC) 2.5 % rectal cream Apply rectally 2 times daily  30 g  1  . hydrocortisone (ANUSOL-HC) 25 MG suppository Place 1 suppository (25 mg total) rectally 2 (two) times daily as needed.  12 suppository  1  . ibuprofen (ADVIL,MOTRIN) 800 MG tablet Take 1 tablet by mouth Every 6 hours as needed.      . metoprolol (LOPRESSOR) 50 MG tablet Take 1 tablet (50 mg total) by mouth 2 (two) times daily.  180 tablet  3  . Multiple Vitamin (MULTIVITAMIN) tablet Take 1 tablet by mouth daily.        . Omega-3 Fatty Acids (FISH OIL) 1200 MG CAPS Take  by mouth daily.        . pantoprazole (PROTONIX) 40 MG tablet Take 1 tablet (40 mg total) by mouth daily.  90 tablet  2  . Potassium Gluconate 550 MG TABS Take by mouth daily.        . pravastatin (PRAVACHOL) 40 MG tablet Take 1 tablet (40 mg total) by mouth daily.  90 tablet  3   Review of Systems Review of Systems  Constitutional: Negative for diaphoresis, activity change, appetite change and unexpected weight change.  HENT: Negative for hearing loss, ear pain, facial swelling, mouth sores and neck stiffness.   Eyes: Negative for pain, redness and visual disturbance.  Respiratory: Negative for shortness of breath and wheezing.   Cardiovascular: Negative for chest pain and palpitations.  Gastrointestinal: Negative for diarrhea, blood in stool, abdominal distention and rectal pain.  Genitourinary: Negative for hematuria, flank pain and decreased urine volume.  Musculoskeletal: Negative for myalgias and joint swelling.  Skin: Negative for color change and wound.  Neurological: Negative for syncope and numbness.  Hematological: Negative for adenopathy.  Psychiatric/Behavioral: Negative for hallucinations, self-injury, decreased concentration and agitation.      Objective:   Physical Exam BP 140/92  Pulse 68  Temp(Src) 98.9 F (37.2 C) (Oral)  Ht 5\' 2"  (1.575 m)  Wt 252 lb 6 oz (114.477 kg)  BMI 46.16 kg/m2  SpO2 96% Physical Exam  VS noted Constitutional: Pt is oriented to person, place, and time. Appears well-developed and well-nourished.  HENT:  Head: Normocephalic and atraumatic.  Right Ear: External ear normal.  Left Ear: External ear normal.  Nose: Nose normal.  Mouth/Throat: Oropharynx is clear and moist.  Eyes: Conjunctivae and EOM are normal. Pupils are equal, round, and reactive to light.  Neck: Normal range of motion. Neck supple. No JVD present. No tracheal deviation present.  Cardiovascular: Normal rate, regular rhythm, normal heart sounds and intact distal pulses.    Pulmonary/Chest: Effort normal and breath sounds normal.  Abdominal: Soft. Bowel sounds are normal. There is no tenderness.  Musculoskeletal: Normal range of motion. Exhibits no edema.  Lymphadenopathy:  Has no cervical adenopathy.  Neurological: Pt is alert and oriented to person, place, and time. Pt has normal reflexes. No cranial nerve deficit.  Skin: Skin is warm and dry. No rash noted.  Psychiatric:  Has  normal mood and affect. Behavior is normal.  Trace to 1+ dorsalis pedis pulses bilat    Assessment & Plan:

## 2011-07-12 NOTE — Assessment & Plan Note (Signed)

## 2011-07-12 NOTE — Assessment & Plan Note (Signed)
For LE art dopplers, r/o PVD

## 2011-07-12 NOTE — Assessment & Plan Note (Signed)
For dxa as she is due,  to f/u any worsening symptoms or concerns

## 2011-07-15 ENCOUNTER — Ambulatory Visit (INDEPENDENT_AMBULATORY_CARE_PROVIDER_SITE_OTHER)
Admission: RE | Admit: 2011-07-15 | Discharge: 2011-07-15 | Disposition: A | Discharge status: Home / Self Care | Payer: Medicare Other | Source: Ambulatory Visit

## 2011-07-15 DIAGNOSIS — N959 Unspecified menopausal and perimenopausal disorder: Secondary | ICD-10-CM

## 2011-07-22 ENCOUNTER — Other Ambulatory Visit: Payer: Self-pay | Admitting: *Deleted

## 2011-07-22 DIAGNOSIS — R42 Dizziness and giddiness: Secondary | ICD-10-CM

## 2011-07-28 ENCOUNTER — Encounter: Payer: Self-pay | Admitting: Internal Medicine

## 2011-08-01 ENCOUNTER — Other Ambulatory Visit: Payer: Self-pay | Admitting: Internal Medicine

## 2011-08-03 NOTE — Telephone Encounter (Signed)
Faxed hardcopy to pharmacy. 

## 2011-08-03 NOTE — Telephone Encounter (Signed)
Done hardcopy to robin  

## 2011-09-18 ENCOUNTER — Other Ambulatory Visit: Payer: Medicare Other

## 2011-09-25 ENCOUNTER — Ambulatory Visit (INDEPENDENT_AMBULATORY_CARE_PROVIDER_SITE_OTHER): Payer: Medicare Other | Admitting: Internal Medicine

## 2011-09-25 ENCOUNTER — Other Ambulatory Visit (INDEPENDENT_AMBULATORY_CARE_PROVIDER_SITE_OTHER): Payer: Medicare Other

## 2011-09-25 ENCOUNTER — Encounter: Payer: Self-pay | Admitting: Internal Medicine

## 2011-09-25 VITALS — BP 152/90 | HR 63 | Temp 98.4°F | Ht 62.0 in | Wt 247.5 lb

## 2011-09-25 DIAGNOSIS — R7309 Other abnormal glucose: Secondary | ICD-10-CM

## 2011-09-25 DIAGNOSIS — R7302 Impaired glucose tolerance (oral): Secondary | ICD-10-CM

## 2011-09-25 DIAGNOSIS — I10 Essential (primary) hypertension: Secondary | ICD-10-CM

## 2011-09-25 DIAGNOSIS — Z0000 Encounter for general adult medical examination without abnormal findings: Secondary | ICD-10-CM

## 2011-09-25 DIAGNOSIS — M545 Low back pain, unspecified: Secondary | ICD-10-CM

## 2011-09-25 DIAGNOSIS — E039 Hypothyroidism, unspecified: Secondary | ICD-10-CM

## 2011-09-25 LAB — CBC WITH DIFFERENTIAL/PLATELET
Eosinophils Relative: 9 % — ABNORMAL HIGH (ref 0.0–5.0)
HCT: 34.6 % — ABNORMAL LOW (ref 36.0–46.0)
Hemoglobin: 11.3 g/dL — ABNORMAL LOW (ref 12.0–15.0)
Lymphocytes Relative: 38.9 % (ref 12.0–46.0)
Lymphs Abs: 3.3 10*3/uL (ref 0.7–4.0)
Monocytes Relative: 6.3 % (ref 3.0–12.0)
Neutro Abs: 3.8 10*3/uL (ref 1.4–7.7)
RBC: 4.03 Mil/uL (ref 3.87–5.11)
WBC: 8.4 10*3/uL (ref 4.5–10.5)

## 2011-09-25 LAB — LDL CHOLESTEROL, DIRECT: Direct LDL: 94.6 mg/dL

## 2011-09-25 LAB — LIPID PANEL
HDL: 41.2 mg/dL (ref >39.00)
Triglycerides: 319 mg/dL — ABNORMAL HIGH (ref 0.0–149.0)

## 2011-09-25 LAB — BASIC METABOLIC PANEL
Calcium: 8.9 mg/dL (ref 8.4–10.5)
Creatinine, Ser: 0.6 mg/dL (ref 0.4–1.2)
GFR: 115.5 mL/min (ref >60.00)
Glucose, Bld: 107 mg/dL — ABNORMAL HIGH (ref 70–99)
Sodium: 141 mEq/L (ref 135–145)

## 2011-09-25 LAB — TSH: TSH: 2.02 u[IU]/mL (ref 0.35–5.50)

## 2011-09-25 LAB — IBC PANEL
Iron: 95 ug/dL (ref 42–145)
Saturation Ratios: 18.2 % — ABNORMAL LOW (ref 20.0–50.0)
Transferrin: 372.2 mg/dL — ABNORMAL HIGH (ref 212.0–360.0)

## 2011-09-25 LAB — HEPATIC FUNCTION PANEL
ALT: 25 U/L (ref 0–35)
AST: 29 U/L (ref 0–37)
Albumin: 3.4 g/dL — ABNORMAL LOW (ref 3.5–5.2)
Alkaline Phosphatase: 64 U/L (ref 39–117)

## 2011-09-25 LAB — HEMOGLOBIN A1C: Hgb A1c MFr Bld: 6.2 % (ref 4.6–6.5)

## 2011-09-25 MED ORDER — CYCLOBENZAPRINE HCL 5 MG PO TABS: 5.0000 mg | ORAL_TABLET | Freq: Three times a day (TID) | ORAL | Status: AC | PRN

## 2011-09-25 MED ORDER — NAPROXEN 500 MG PO TABS: 500.0000 mg | ORAL_TABLET | Freq: Two times a day (BID) | ORAL | Status: AC

## 2011-09-25 NOTE — Patient Instructions (Signed)
Please remember to followup with your GYN for the yearly pap smear and/or mammogram  - Dr Tenny Craw Please go to LAB in the [facility] for the blood and/or urine tests to be done today You will be contacted by phone if any changes need to be made immediately.  Otherwise, you will receive a letter about your results with an explanation. OK to stop the diclofenac Take all new medications as prescribed  - the naproxen for pain, and the muscle relaxer to try as needed Please continue your efforts at being more active, low cholesterol diet, and weight control. Please return in 1 year for your yearly visit, or sooner if needed, with Lab testing done 3-5 days before

## 2011-09-25 NOTE — Assessment & Plan Note (Signed)

## 2011-09-26 ENCOUNTER — Encounter: Payer: Self-pay | Admitting: Internal Medicine

## 2011-09-26 DIAGNOSIS — M545 Low back pain, unspecified: Secondary | ICD-10-CM | POA: Insufficient documentation

## 2011-09-26 NOTE — Assessment & Plan Note (Signed)
stable overall by hx and exam, most recent data reviewed with pt, and pt to continue medical treatment as before Lab Results  Component Value Date   TSH 2.02 09/25/2011

## 2011-09-26 NOTE — Assessment & Plan Note (Signed)
stable overall by hx and exam, most recent data reviewed with pt, and pt to continue medical treatment as before BP Readings from Last 3 Encounters:  09/25/11 152/90  07/07/11 140/92  05/05/11 140/90

## 2011-09-26 NOTE — Progress Notes (Signed)
Subjective:    Patient ID: Andrea Perry, female    DOB: 07-Apr-1951, 61 y.o.   MRN: [id number]  HPI  Here to f/u; overall doing ok;  Pt denies chest pain, increased sob or doe, wheezing, orthopnea, PND, increased LE swelling, palpitations, dizziness or syncope.  Pt denies new neurological symptoms such as new headache, or facial or extremity weakness or numbness   Pt denies polydipsia, polyuria.  Pt continues to have recurring LBP without change in severity, bowel or bladder change, fever, wt loss,  worsening LE pain/numbness/weakness, gait change or falls.  Pt denies fever, wt loss, night sweats, loss of appetite, or other constitutional symptoms  Denies worsening depressive symptoms, suicidal ideation, or panic, though has ongoing anxiety, not increased recently.  Wt has reduced from 257 to 247. Diclofenac not working well for pain.   BP at home has been < 140/90.  Denies hyper or hypo thyroid symptoms such as voice, skin or hair change. Past Medical History  Diagnosis Date  . Impaired glucose tolerance 09/21/2010  . ALLERGIC RHINITIS 08/16/2008  . ANXIETY 08/16/2008  . ASTHMA 08/16/2008  . COPD 08/16/2008  . DEPRESSION 08/16/2008  . GENITAL HERPES 12/03/2006  . HEMATOCHEZIA 06/20/2009  . HYPERLIPIDEMIA 08/16/2008  . HYPERTENSION 12/03/2006  . HYPOTHYROIDISM 12/03/2006  . IBS 06/20/2009  . Arthritis   . Hemorrhoids    Past Surgical History  Procedure Date  . Appendectomy   . Knee surgery     Right   . Tonsillectomy and adenoidectomy   . Abdominal hysterectomy   . Carpal tunnel release     Right   . Cesarean section     x 1   . Colonoscopy 2004    reports that she quit smoking about 8 years ago. She has never used smokeless tobacco. She reports that she drinks alcohol. She reports that she does not use illicit drugs. family history includes Colon polyps in her mother; Diabetes in her mother; Hyperlipidemia in her mother; Hypertension in her mother; and Stroke in her father.  There is no history  of Colon cancer. Allergies  Allergen Reactions  . Codeine   . Tramadol Hcl     REACTION: mouth dryness, headache  . Vicodin (Hydrocodone-Acetaminophen)    Current Outpatient Prescriptions on File Prior to Visit  Medication Sig Dispense Refill  . albuterol (VENTOLIN HFA) 108 (90 BASE) MCG/ACT inhaler Inhale 2 puffs into the lungs every 6 (six) hours as needed.  3 Inhaler  3  . ALPRAZolam (XANAX) 1 MG tablet TAKE ONE TABLET BY MOUTH TWICE DAILY  60 tablet  2  . aspirin 81 MG EC tablet Take 81 mg by mouth daily.        . cetirizine (ZYRTEC) 10 MG tablet Take 10 mg by mouth daily.        Marland Kitchen estradiol (ESTRACE) 2 MG tablet Take 1 tablet (2 mg total) by mouth daily.  90 tablet  3  . Fluticasone-Salmeterol (ADVAIR DISKUS) 250-50 MCG/DOSE AEPB Inhale 1 puff into the lungs 2 (two) times daily.  3 each  3  . furosemide (LASIX) 40 MG tablet Take 1 tablet (40 mg total) by mouth daily.  90 tablet  3  . Garlic 1000 MG CAPS Take by mouth daily.        . hydrocortisone (ANUSOL-HC) 2.5 % rectal cream Apply rectally 2 times daily  30 g  1  . hydrocortisone (ANUSOL-HC) 25 MG suppository Place 1 suppository (25 mg total) rectally 2 (two) times daily as  needed.  12 suppository  1  . ibuprofen (ADVIL,MOTRIN) 800 MG tablet Take 1 tablet by mouth Every 6 hours as needed.      . metoprolol (LOPRESSOR) 50 MG tablet Take 1 tablet (50 mg total) by mouth 2 (two) times daily.  180 tablet  3  . Multiple Vitamin (MULTIVITAMIN) tablet Take 1 tablet by mouth daily.        . Omega-3 Fatty Acids (FISH OIL) 1200 MG CAPS Take by mouth daily.        . pantoprazole (PROTONIX) 40 MG tablet Take 1 tablet (40 mg total) by mouth daily.  90 tablet  2  . Potassium Gluconate 550 MG TABS Take by mouth daily.        . pravastatin (PRAVACHOL) 40 MG tablet Take 1 tablet (40 mg total) by mouth daily.  90 tablet  3   Review of Systems Constitutional: Negative for diaphoresis and unexpected weight change.  Eyes: Negative for photophobia and  visual disturbance.  Respiratory: Negative for choking and stridor.   Gastrointestinal: Negative for vomiting and blood in stool.  Genitourinary: Negative for hematuria and decreased urine volume.  Musculoskeletal: Negative for gait problem.  Skin: Negative for color change and wound.  Neurological: Negative for tremors and numbness.  Psychiatric/Behavioral: Negative for decreased concentration. The patient is not hyperactive.      Objective:   Physical Exam BP 152/90  Pulse 63  Temp(Src) 98.4 F (36.9 C) (Oral)  Ht 5\' 2"  (1.575 m)  Wt 247 lb 8 oz (112.265 kg)  BMI 45.27 kg/m2  SpO2 96% Physical Exam  VS noted, not ill appearing Constitutional: Pt appears well-developed and well-nourished.  HENT: Head: Normocephalic.  Right Ear: External ear normal.  Left Ear: External ear normal.  Eyes: Conjunctivae and EOM are normal. Pupils are equal, round, and reactive to light.  Neck: Normal range of motion. Neck supple.  Cardiovascular: Normal rate and regular rhythm.   Pulmonary/Chest: Effort normal and breath sounds normal.  Abd:  Soft, NT, non-distended, + BS Neurological: Pt is alert. Not confused  Skin: Skin is warm. No erythema. No rash.  Trace edema bilat LE's Psychiatric: Pt behavior is normal. Thought content normal. 1+ nervous    Assessment & Plan:

## 2011-09-26 NOTE — Assessment & Plan Note (Signed)
stable overall by hx and exam, most recent data reviewed with pt, and pt to continue medical treatment as before Lab Results  Component Value Date   HGBA1C 6.2 09/25/2011

## 2011-09-26 NOTE — Assessment & Plan Note (Signed)
Benign exam, likely some underlying lumbar DJD or DDD, for naproxen prn, and flexeril tid prn,  to f/u any worsening symptoms or concerns\

## 2011-10-26 ENCOUNTER — Other Ambulatory Visit: Payer: Self-pay | Admitting: Internal Medicine

## 2011-12-16 ENCOUNTER — Other Ambulatory Visit: Payer: Self-pay | Admitting: Obstetrics and Gynecology

## 2011-12-22 ENCOUNTER — Ambulatory Visit (INDEPENDENT_AMBULATORY_CARE_PROVIDER_SITE_OTHER): Payer: Medicare Other | Admitting: Sports Medicine

## 2011-12-22 VITALS — BP 167/94 | Ht 63.0 in | Wt 245.0 lb

## 2011-12-22 DIAGNOSIS — M545 Low back pain, unspecified: Secondary | ICD-10-CM

## 2011-12-22 MED ORDER — GABAPENTIN 300 MG PO CAPS: 300.0000 mg | ORAL_CAPSULE | Freq: Every day | ORAL | Status: DC

## 2011-12-22 MED ORDER — KETOROLAC TROMETHAMINE 60 MG/2ML IM SOLN
60.0000 mg | Freq: Once | INTRAMUSCULAR | Status: AC
  Administered 2011-12-22: 60 mg via INTRAMUSCULAR

## 2011-12-22 NOTE — Progress Notes (Signed)
  Subjective:    Patient ID: Andrea Perry, female    DOB: [birth year], 61 y.o.   MRN: [id number]  HPI chief complaint: Low back pain  Patient is a 61 year old female whom I was caring for at [facility]. She has a history of lumbar degenerative disc disease. MRI was done 2 years ago. She's had good response with physical therapy in the past but is concerned about worsening pain. No recent trauma. Main complaint is diffuse low back pain with occasional radiating pain into the left buttock and down the left leg. Some associated weakness with the left leg. No groin pain. We had apparently discussed the possibility of lumbar ESIs at a previous visit she is not interested in that. She does state however that her symptoms are severe enough that if we feel that it may be warranted she may be interested in seeing a neurosurgeon. She takes Naprosyn as well as Flexeril for her pain.    Review of Systems     Objective:   Physical Exam Well-developed, well-nourished. No acute distress. Awake alert and oriented x3  Lumbar spine shows fairly good mobility with pain both with forward flexion and extension. Diffuse tenderness to palpation but nothing focal. No appreciable spasm. Negative straight leg raise bilaterally. Reflexes are trace but equal at the Achilles and patellar tendons. 4+/5 strength with plantar flexion and great toe extension on the left when compared to the right. Good dorsalis pedis and posterior tibial pulses. Walking with a slight limp.        Assessment & Plan:  1. Low back pain secondary to lumbar degenerative disc disease/question spinal stenosis  Want to review her MRI from 2 years ago. We will inject her with 80 mg of Depo-Medrol IM and 60 mg of Toradol IM. We will try her on Neurontin starting at 300 mg each bedtime and increasing to 600 mg each bedtime in one week. I will call the patient in one to 2 weeks after reviewing her MRI scan. We will delineate further  treatment at that time.

## 2011-12-28 ENCOUNTER — Telehealth: Payer: Self-pay | Admitting: Sports Medicine

## 2011-12-28 NOTE — Telephone Encounter (Signed)
Spoke to the patient today on the phone after reviewing her records from [facility]. Review of those records as well as of a previous MRI scan of her lumbar spine shows that she's been treated for severe spinal stenosis. She was evaluated by Dr. Yevette Edwards who referred her for physical therapy which was somewhat helpful. Patient is feeling a little bit better today after last week's IM Depo-Medrol injection. She has also noticed some benefit from her Neurontin. She is on 600 mg each bedtime. I explained to her that this is a difficult problem in that the stenosis will be present unless surgically decompressed, but I think that should be a final option. Since she is doing better on the Neurontin I will see her back in the office in 3-4 weeks. We may need to titrate this dose up. If symptoms become intolerable she will need to return to Dr. Yevette Edwards.

## 2011-12-29 ENCOUNTER — Encounter: Payer: Self-pay | Admitting: Sports Medicine

## 2012-01-16 ENCOUNTER — Other Ambulatory Visit: Payer: Self-pay | Admitting: Internal Medicine

## 2012-01-20 ENCOUNTER — Ambulatory Visit (INDEPENDENT_AMBULATORY_CARE_PROVIDER_SITE_OTHER): Payer: Medicare Other | Admitting: Sports Medicine

## 2012-01-20 ENCOUNTER — Encounter: Payer: Self-pay | Admitting: Sports Medicine

## 2012-01-20 VITALS — BP 137/83 | HR 57 | Ht 63.0 in | Wt 245.0 lb

## 2012-01-20 DIAGNOSIS — R600 Localized edema: Secondary | ICD-10-CM

## 2012-01-20 DIAGNOSIS — M4800 Spinal stenosis, site unspecified: Secondary | ICD-10-CM

## 2012-01-20 DIAGNOSIS — R609 Edema, unspecified: Secondary | ICD-10-CM

## 2012-01-20 DIAGNOSIS — M25569 Pain in unspecified knee: Secondary | ICD-10-CM

## 2012-01-20 NOTE — Progress Notes (Signed)
  Subjective:    Patient ID: Andrea Perry, female    DOB: August 06, 1950, 61 y.o.   MRN: [id number]  HPI Patient comes in today for followup. I did receive the records from [facility]. She has a history of severe spinal stenosis in her lumbar spine. Her symptoms are much better on 600 mg of gabapentin each bedtime. Still some mild numbness in each of her legs but she denies any significant weakness. She has not undergone lumbar ESI's but has done physical therapy which she did not find very affective. Her main complaint today is left knee pain. Diffuse pain it began without any injury. She's getting swelling as well. Pain is worse with standing improves some at rest. She's been taking Tylenol arthritis which has been minimally helpful. She is unable to take NSAIDs due to hypertension. She has a history of a right knee scope done several years ago and has done well with this postoperatively. She denies any locking or catching in the left knee. No feelings of instability.   Review of Systems     Objective:   Physical Exam Well-developed, well-nourished. No acute distress. Awake alert and oriented x3. Vital signs are reviewed Lumbar spine shows limited range of motion secondary to body habitus. No tenderness to palpation along the lumbar midline or paraspinal musculature. No appreciable spasm. Negative straight leg raise bilaterally. Strength is 5/5 both lower extremities. Reflexes are trace but equal at the Achilles and patellar tendons. Sensation is intact to light-touch distally. She has 2+ pitting edema in both lower extremities which begins about mid tibia. Left knee shows range of motion from 0-120. Trace to 1+ articular effusion. Diffuse tenderness along both medial and lateral joint lines, but negative McMurray's. 1+ patellofemoral crepitus. Knee is stable to ligamentous exam. She is walking with a slight limp.      Assessment & Plan:  1. Left knee pain likely secondary to osteoarthritis 2.  Status post right knee arthroscopy-doing well 3. Severe spinal stenosis 4. Bilateral lower extremity edema  We will inject her left knee today with cortisone. Anterior lateral approach utilized. If symptoms persist I would consider plain x-rays to evaluate degree of osteoarthritis. Prescription for compression stockings was provided. Her spinal stenosis is currently stable on 600 mg of gabapentin each bedtime. I explained to her that we can titrate this up if necessary. She will followup with her PCP if lower extremity edema persists. Followup with me when necessary.  Consent obtained and verified. Time-out conducted. Noted no overlying erythema, induration, or other signs of local infection. Skin prepped in a sterile fashion. Topical analgesic spray: Ethyl chloride. Joint: Left knee  Needle: 1.5 inch 25 gauge Completed without difficulty. Meds: 3 cc of 0.5% Marcaine and 1 cc Depo-Medrol  Advised to call if fevers/chills, erythema, induration, drainage, or persistent bleeding.

## 2012-01-22 ENCOUNTER — Other Ambulatory Visit: Payer: Self-pay | Admitting: Internal Medicine

## 2012-02-03 ENCOUNTER — Telehealth: Payer: Self-pay | Admitting: Internal Medicine

## 2012-02-03 NOTE — Telephone Encounter (Signed)
No, as she is already on lexapro , and zoloft and effexor have been the kind of med that o/w can help with hot flashes

## 2012-02-03 NOTE — Telephone Encounter (Signed)
Caller: [name]/Patient; Patient Name: Andrea Perry; PCP: Oliver Barre (Adults only); Best Callback Phone Number: [phone]. Pt calling stating script Estradiol is will not be covered by new Medicare part D and she wil need an alternative script on her next refill.  Pharmacy give pt the  phone # [phone] to her [employer] Medicare Part D.  Pt states she has enough medication to get her through until the changes go into effect.  No triage.

## 2012-02-03 NOTE — Telephone Encounter (Signed)
Called the patient informed of MD instructions on medication. She would like to know if there is an alternative to take other than the rx for hormones.

## 2012-02-03 NOTE — Telephone Encounter (Signed)
Patient informed. 

## 2012-02-03 NOTE — Telephone Encounter (Signed)
Please let pt know, unless I am mistaken, the estradiol is on the $4 list (cash) at [facility] , [facility] and [facility];  There is no good alternative that is less expensive  I would suggest staying on med, and pay cash at a $4 list pharmacy

## 2012-04-29 ENCOUNTER — Other Ambulatory Visit: Payer: Self-pay | Admitting: Internal Medicine

## 2012-04-29 NOTE — Telephone Encounter (Signed)
Done hardcopy to robin  

## 2012-04-29 NOTE — Telephone Encounter (Signed)
Faxed hardcopy to pharmacy. 

## 2012-06-09 ENCOUNTER — Telehealth: Payer: Self-pay | Admitting: Internal Medicine

## 2012-06-09 ENCOUNTER — Ambulatory Visit
Admission: RE | Admit: 2012-06-09 | Discharge: 2012-06-09 | Disposition: A | Discharge status: Home / Self Care | Payer: Medicare Other | Source: Ambulatory Visit | Attending: Sports Medicine | Admitting: Sports Medicine

## 2012-06-09 ENCOUNTER — Ambulatory Visit (INDEPENDENT_AMBULATORY_CARE_PROVIDER_SITE_OTHER): Payer: Medicare Other | Admitting: Sports Medicine

## 2012-06-09 VITALS — BP 140/80 | Ht 63.0 in | Wt 240.0 lb

## 2012-06-09 DIAGNOSIS — M25539 Pain in unspecified wrist: Secondary | ICD-10-CM

## 2012-06-09 DIAGNOSIS — M25532 Pain in left wrist: Secondary | ICD-10-CM

## 2012-06-09 DIAGNOSIS — M25562 Pain in left knee: Secondary | ICD-10-CM

## 2012-06-09 DIAGNOSIS — M25569 Pain in unspecified knee: Secondary | ICD-10-CM

## 2012-06-09 MED ORDER — FLUTICASONE-SALMETEROL 250-50 MCG/DOSE IN AEPB: 1.0000 | INHALATION_SPRAY | Freq: Two times a day (BID) | RESPIRATORY_TRACT | Status: DC

## 2012-06-09 NOTE — Progress Notes (Signed)
Subjective:    Andrea Perry is a 62 y.o. female who presents for knee pain adn hand swelling  Knee pain/swelling: Chronic condition. R knee operated on 10 years ago by Dr. Bertram Gala. Popping sensation onset about 2 weeks ago. Feels just like the R knee prior to scope. Mild increase in pain and swelling since that time. Stiff first thing in the morning. Improves w/ rest and elevation. Taking Lasix 40mg  daily. Tylenol w/ some benefit  Hand swelling: Dx w/ carpel tunnel a couple of years ago. (Rt repaired several years ago w/ relief). Swelling for past 6 mo. Typically when wakes up in am. Swelling and stiffness diminishes over the course of the day. Denies pain or change in sensation. 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 Tylenol w/ some benefit. Numbness in hand is not related to swelling. Positive flick sign.  The following portions of the patient's history were reviewed and updated as appropriate: allergies, current medications, past surgical history and problem list.   Review of Systems Pertinent items are noted in HPI.   Objective:    BP 140/80  Ht 5\' 3"  (1.6 m)  Wt 240 lb (108.863 kg)  BMI 42.52 kg/m2 Gen: Obese, NAD HEENt: EOMI, MMM Skin: intact, warm, well perfused Ext: 1+ LE edema Neuro: CN grossly intact, cerebellar fxn normal MSK: ROM of L knee limited in part by body habitus as evidenced by L knee extension only to about 100 degrees. Non painful to palpation. No crepitus. Mild pain on valgus stress. No pain on McMurrays. Lochmans nml but again limited due to body habitus. Unable to r/o effusion.  L wrist puffy in apperance. FROM. Non-ttp. No thenar or hypothenar wasting. Grip strength3+ bilaterally. Sensation intact. Thumb opposition, finger abduction and extension w/o nml.   X-ray: none   Assessment:   1. L knee w/ likely Osteoarthritis 2. L hand w/ possible carpel tunnel    Plan:  1. Nerve conduction study of L wrist 2. L knee A/P, Lat, sunrise, 30 degree flexion 3.  Will consider cortisone shot of L knee   Phone followup after I reviewed the x-rays of her knee and her EMG/nerve conduction studies of her left wrist. She may need referral back to Dr. Thurston Hole for her knee or to Dr. Eulah Pont for possible left carpal tunnel release.

## 2012-06-09 NOTE — Telephone Encounter (Signed)
Prescription sent in  

## 2012-06-09 NOTE — Patient Instructions (Addendum)
DR WES IBAZABO NERVE CONDUCTION STUDY MON FEB 24TH AT 2PM [phone] [address]

## 2012-06-09 NOTE — Telephone Encounter (Signed)
Patient is requesting a refill on Advair be sent to her pharmacy, [facility]

## 2012-06-20 ENCOUNTER — Ambulatory Visit: Payer: Medicare Other | Admitting: Sports Medicine

## 2012-06-21 ENCOUNTER — Encounter: Payer: Self-pay | Admitting: *Deleted

## 2012-06-21 ENCOUNTER — Other Ambulatory Visit: Payer: Self-pay | Admitting: Internal Medicine

## 2012-06-21 ENCOUNTER — Telehealth: Payer: Self-pay | Admitting: Sports Medicine

## 2012-06-21 ENCOUNTER — Other Ambulatory Visit: Payer: Self-pay | Admitting: *Deleted

## 2012-06-21 NOTE — Telephone Encounter (Signed)
Message copied by Ralene Cork on Tue Jun 21, 2012 12:27 PM ------      Message from: Annita Brod      Created: Wed Jun 15, 2012  3:32 PM      Regarding: FW: xray results      Contact: [phone]                   ----- Message -----         From: Lizbeth Bark         Sent: 06/15/2012  10:07 AM           To: Lillia Pauls, CMA      Subject: xray results                                              Pt called for her xray results, as well as nerve conduction study results of left wrist .       ------

## 2012-06-21 NOTE — Telephone Encounter (Signed)
I spoke with the patient yesterday on the phone about x-rays of her left knee as well as

## 2012-06-21 NOTE — Telephone Encounter (Signed)
I spoke with the patient on the phone yesterday regarding x-ray findings of her left knee as well as a recent EMG/nerve conduction studies of her left wrist. The x-rays show tricompartmental degenerative changes particularly in the medial compartment. She does still have a little bit of joint space left in the medial compartment on her standing views. The EMG/nerve conduction studies showed evidence of moderate carpal tunnel syndrome. She has a history of a right knee arthroscopy for a degenerative meniscal tear which has done very well postoperatively. Patient states that her left knee pain is similar in nature to that which she experienced previously on the right. She would like to return to the treating orthopedic surgeon, Dr. Thurston Hole, to discuss merits of arthroscopy on the left knee. We will arrange a consultation for her. In regards to her carpal tunnel syndrome, I recommended that she followup with Dr. Eulah Pont as he has done a right carpal tunnel release on her in the past with excellent results. Followup with me when necessary.

## 2012-06-21 NOTE — Telephone Encounter (Signed)
Message copied by Ralene Cork on Tue Jun 21, 2012 12:26 PM ------      Message from: Annita Brod      Created: Wed Jun 15, 2012  3:32 PM      Regarding: FW: xray results      Contact: [phone]                   ----- Message -----         From: Lizbeth Bark         Sent: 06/15/2012  10:07 AM           To: Lillia Pauls, CMA      Subject: xray results                                              Pt called for her xray results, as well as nerve conduction study results of left wrist .       ------

## 2012-06-21 NOTE — Patient Instructions (Signed)
DR Maryclare Bean MAR 6TH AT 10AM [address] [place]  [phone]

## 2012-06-30 ENCOUNTER — Telehealth: Payer: Self-pay

## 2012-06-30 NOTE — Telephone Encounter (Signed)
OK for surgury  Form signed to be faxed

## 2012-06-30 NOTE — Telephone Encounter (Signed)
Left msg on triage want to have (L) knee surgery. Requesting  medical clearance...Raechel Chute

## 2012-06-30 NOTE — Telephone Encounter (Signed)
Sherry at Dr. Sherene Sires office called to inform he would like to schedule left knee surgery for this patient.  Do you need to see the patient for clearnace?  Call back number for Cordelia Pen is [phone] ext. [phone].  Please advise

## 2012-07-01 NOTE — Telephone Encounter (Signed)
Form faxed

## 2012-09-27 ENCOUNTER — Other Ambulatory Visit (INDEPENDENT_AMBULATORY_CARE_PROVIDER_SITE_OTHER): Payer: Medicare Other

## 2012-09-27 ENCOUNTER — Ambulatory Visit (INDEPENDENT_AMBULATORY_CARE_PROVIDER_SITE_OTHER): Payer: Medicare Other | Admitting: Internal Medicine

## 2012-09-27 ENCOUNTER — Encounter: Payer: Self-pay | Admitting: Internal Medicine

## 2012-09-27 VITALS — BP 132/86 | HR 55 | Temp 98.0°F | Ht 62.0 in | Wt 247.2 lb

## 2012-09-27 DIAGNOSIS — E785 Hyperlipidemia, unspecified: Secondary | ICD-10-CM

## 2012-09-27 DIAGNOSIS — Z0000 Encounter for general adult medical examination without abnormal findings: Secondary | ICD-10-CM

## 2012-09-27 DIAGNOSIS — R7309 Other abnormal glucose: Secondary | ICD-10-CM

## 2012-09-27 DIAGNOSIS — R7302 Impaired glucose tolerance (oral): Secondary | ICD-10-CM

## 2012-09-27 LAB — LIPID PANEL
HDL: 36.5 mg/dL — ABNORMAL LOW (ref >39.00)
Total CHOL/HDL Ratio: 3
VLDL: 48 mg/dL — ABNORMAL HIGH (ref 0.0–40.0)

## 2012-09-27 LAB — URINALYSIS, ROUTINE W REFLEX MICROSCOPIC
Ketones, ur: NEGATIVE
Specific Gravity, Urine: 1.02 (ref 1.000–1.030)
Urine Glucose: NEGATIVE
pH: 6 (ref 5.0–8.0)

## 2012-09-27 LAB — CBC WITH DIFFERENTIAL/PLATELET
Basophils Absolute: 0 10*3/uL (ref 0.0–0.1)
Eosinophils Relative: 4.9 % (ref 0.0–5.0)
Lymphs Abs: 2.7 10*3/uL (ref 0.7–4.0)
MCV: 90.9 fl (ref 78.0–100.0)
Monocytes Absolute: 0.4 10*3/uL (ref 0.1–1.0)
Neutrophils Relative %: 57.8 % (ref 43.0–77.0)
Platelets: 242 10*3/uL (ref 150.0–400.0)
RDW: 13.6 % (ref 11.5–14.6)
WBC: 8.4 10*3/uL (ref 4.5–10.5)

## 2012-09-27 LAB — HEPATIC FUNCTION PANEL
ALT: 17 U/L (ref 0–35)
Bilirubin, Direct: 0.1 mg/dL (ref 0.0–0.3)
Total Bilirubin: 0.8 mg/dL (ref 0.3–1.2)

## 2012-09-27 LAB — BASIC METABOLIC PANEL
BUN: 9 mg/dL (ref 6–23)
CO2: 32 mEq/L (ref 19–32)
Chloride: 102 mEq/L (ref 96–112)
GFR: 102.54 mL/min (ref >60.00)
Glucose, Bld: 97 mg/dL (ref 70–99)
Potassium: 5 mEq/L (ref 3.5–5.1)
Sodium: 140 mEq/L (ref 135–145)

## 2012-09-27 LAB — LDL CHOLESTEROL, DIRECT: Direct LDL: 55.7 mg/dL

## 2012-09-27 LAB — TSH: TSH: 4.31 u[IU]/mL (ref 0.35–5.50)

## 2012-09-27 LAB — HEMOGLOBIN A1C: Hgb A1c MFr Bld: 6 % (ref 4.6–6.5)

## 2012-09-27 MED ORDER — ALPRAZOLAM 1 MG PO TABS: ORAL_TABLET | ORAL | Status: DC

## 2012-09-27 MED ORDER — METOPROLOL TARTRATE 50 MG PO TABS: ORAL_TABLET | ORAL | Status: DC

## 2012-09-27 MED ORDER — FUROSEMIDE 40 MG PO TABS: ORAL_TABLET | ORAL | Status: DC

## 2012-09-27 NOTE — Progress Notes (Signed)
Subjective:    Patient ID: Andrea Perry, female    DOB: 1951/04/17, 62 y.o.   MRN: [id number]  HPI  Here for wellness and f/u;  Overall doing ok;  Pt denies CP, worsening SOB, DOE, wheezing, orthopnea, PND, worsening LE edema, palpitations, dizziness or syncope.  Pt denies neurological change such as new headache, facial or extremity weakness.  Pt denies polydipsia, polyuria, or low sugar symptoms. Pt states overall good compliance with treatment and medications, good tolerability, and has been trying to follow lower cholesterol diet.  Pt denies worsening depressive symptoms, suicidal ideation or panic. No fever, night sweats, wt loss, loss of appetite, or other constitutional symptoms.  Pt states good ability with ADL's, has low fall risk, home safety reviewed and adequate, no other significant changes in hearing or vision, and only occasionally active with exercise.  On disability due to COPD for approx 4 yrs. Has upcoming surgury with Dr Alisa Graff for left CTS.  Pt continues to have recurring LBP without change in severity, bowel or bladder change, fever, wt loss,  worsening LE pain/numbness/weakness, gait change or falls. May need eventual surgury lumbar.  Also has urinary incont/leakage being eval by GYN for now, may need urology Past Medical History  Diagnosis Date  . Impaired glucose tolerance 09/21/2010  . ALLERGIC RHINITIS 08/16/2008  . ANXIETY 08/16/2008  . ASTHMA 08/16/2008  . COPD 08/16/2008  . DEPRESSION 08/16/2008  . GENITAL HERPES 12/03/2006  . HEMATOCHEZIA 06/20/2009  . HYPERLIPIDEMIA 08/16/2008  . HYPERTENSION 12/03/2006  . HYPOTHYROIDISM 12/03/2006  . IBS 06/20/2009  . Arthritis   . Hemorrhoids    Past Surgical History  Procedure Laterality Date  . Appendectomy    . Knee surgery      Right   . Tonsillectomy and adenoidectomy    . Abdominal hysterectomy    . Carpal tunnel release      Right   . Cesarean section      x 1   . Colonoscopy  2004    reports that she quit smoking  about 9 years ago. She has never used smokeless tobacco. She reports that  drinks alcohol. She reports that she does not use illicit drugs. family history includes Colon polyps in her mother; Diabetes in her mother; Hyperlipidemia in her mother; Hypertension in her mother; and Stroke in her father.  There is no history of Colon cancer. Allergies  Allergen Reactions  . Codeine   . Tramadol Hcl     REACTION: mouth dryness, headache  . Vicodin (Hydrocodone-Acetaminophen)    Current Outpatient Prescriptions on File Prior to Visit  Medication Sig Dispense Refill  . albuterol (VENTOLIN HFA) 108 (90 BASE) MCG/ACT inhaler Inhale 2 puffs into the lungs every 6 (six) hours as needed.  3 Inhaler  3  . aspirin 81 MG EC tablet Take 81 mg by mouth daily.        . cetirizine (ZYRTEC) 10 MG tablet Take 10 mg by mouth daily.        Marland Kitchen escitalopram (LEXAPRO) 10 MG tablet TAKE ONE TABLET BY MOUTH EVERY DAY  90 tablet  3  . estradiol (ESTRACE) 2 MG tablet TAKE ONE TABLET BY MOUTH EVERY DAY  90 tablet  3  . Fluticasone-Salmeterol (ADVAIR DISKUS) 250-50 MCG/DOSE AEPB Inhale 1 puff into the lungs 2 (two) times daily.  3 each  3  . gabapentin (NEURONTIN) 300 MG capsule Take 1-2 capsules (300-600 mg total) by mouth at bedtime. Start with 1 pill X 1; then 2  pills at night  60 capsule  2  . Garlic 1000 MG CAPS Take by mouth daily.        . hydrocortisone (ANUSOL-HC) 25 MG suppository Place 1 suppository (25 mg total) rectally 2 (two) times daily as needed.  12 suppository  1  . ibuprofen (ADVIL,MOTRIN) 800 MG tablet Take 1 tablet by mouth Every 6 hours as needed.      . Multiple Vitamin (MULTIVITAMIN) tablet Take 1 tablet by mouth daily.        . Omega-3 Fatty Acids (FISH OIL) 1200 MG CAPS Take by mouth daily.        . pantoprazole (PROTONIX) 40 MG tablet TAKE ONE TABLET BY MOUTH EVERY DAY  90 tablet  3  . Potassium Gluconate 550 MG TABS Take by mouth daily.        . pravastatin (PRAVACHOL) 40 MG tablet TAKE ONE TABLET  BY MOUTH EVERY DAY  90 tablet  3   No current facility-administered medications on file prior to visit.   Review of Systems Constitutional: Negative for diaphoresis, activity change, appetite change or unexpected weight change.  HENT: Negative for hearing loss, ear pain, facial swelling, mouth sores and neck stiffness.   Eyes: Negative for pain, redness and visual disturbance.  Respiratory: Negative for shortness of breath and wheezing.   Cardiovascular: Negative for chest pain and palpitations.  Gastrointestinal: Negative for diarrhea, blood in stool, abdominal distention or other pain Genitourinary: Negative for hematuria, flank pain or change in urine volume.  Musculoskeletal: Negative for myalgias and joint swelling.  Skin: Negative for color change and wound.  Neurological: Negative for syncope and numbness. other than noted Hematological: Negative for adenopathy.  Psychiatric/Behavioral: Negative for hallucinations, self-injury, decreased concentration and agitation.      Objective:   Physical Exam BP 132/86  Pulse 55  Temp(Src) 98 F (36.7 C) (Oral)  Ht 5\' 2"  (1.575 m)  Wt 247 lb 4 oz (112.152 kg)  BMI 45.21 kg/m2  SpO2 95% VS noted,  Constitutional: Pt is oriented to person, place, and time. Appears well-developed and well-nourished. /morbid obese Head: Normocephalic and atraumatic.  Right Ear: External ear normal.  Left Ear: External ear normal.  Nose: Nose normal.  Mouth/Throat: Oropharynx is clear and moist.  Eyes: Conjunctivae and EOM are normal. Pupils are equal, round, and reactive to light.  Neck: Normal range of motion. Neck supple. No JVD present. No tracheal deviation present.  Cardiovascular: Normal rate, regular rhythm, normal heart sounds and intact distal pulses.   Pulmonary/Chest: Effort normal and breath sounds normal.  Abdominal: Soft. Bowel sounds are normal. There is no tenderness. No HSM  Musculoskeletal: Normal range of motion. Exhibits no edema.   Lymphadenopathy:  Has no cervical adenopathy.  Neurological: Pt is alert and oriented to person, place, and time. Pt has normal reflexes. No cranial nerve deficit.  Skin: Skin is warm and dry. No rash noted.  Psychiatric:  Has  normal mood and affect. Behavior is normal.     Assessment & Plan:

## 2012-09-27 NOTE — Patient Instructions (Signed)
Please continue all other medications as before, and refills have been done if requested. Please have the pharmacy call with any other refills you may need. Please continue your efforts at being more active, low cholesterol diet, and weight control. You are otherwise up to date with prevention measures today. You will be contacted regarding the referral for: colonoscopy for after oct 2014 Please go to the LAB in the Basement (turn left off the elevator) for the tests to be done today You will be contacted by phone if any changes need to be made immediately.  Otherwise, you will receive a letter about your results with an explanation  Please remember to sign up for My Chart if you have not done so, as this will be important to you in the future with finding out test results, communicating by private email, and scheduling acute appointments online when needed.  Please return in 6 months, or sooner if needed

## 2012-09-27 NOTE — Assessment & Plan Note (Signed)

## 2012-10-06 ENCOUNTER — Other Ambulatory Visit: Payer: Self-pay

## 2012-10-06 MED ORDER — ALBUTEROL SULFATE HFA 108 (90 BASE) MCG/ACT IN AERS: 2.0000 | INHALATION_SPRAY | Freq: Four times a day (QID) | RESPIRATORY_TRACT | Status: DC | PRN

## 2012-10-07 ENCOUNTER — Other Ambulatory Visit: Payer: Self-pay | Admitting: Obstetrics and Gynecology

## 2012-10-14 ENCOUNTER — Encounter (HOSPITAL_COMMUNITY): Payer: Self-pay | Admitting: Pharmacist

## 2012-10-17 NOTE — Pre-Procedure Instructions (Signed)
Message left for stacey or scheduler at office to have MD place preop orders for PAT visit tomorrow.

## 2012-10-18 ENCOUNTER — Encounter (HOSPITAL_COMMUNITY)
Admission: RE | Admit: 2012-10-18 | Discharge: 2012-10-18 | Disposition: A | Discharge status: Home / Self Care | Payer: Medicare Other | Source: Ambulatory Visit | Attending: Obstetrics and Gynecology | Admitting: Obstetrics and Gynecology

## 2012-10-18 ENCOUNTER — Encounter (HOSPITAL_COMMUNITY): Payer: Self-pay

## 2012-10-18 DIAGNOSIS — Z01818 Encounter for other preprocedural examination: Secondary | ICD-10-CM | POA: Insufficient documentation

## 2012-10-18 DIAGNOSIS — Z01812 Encounter for preprocedural laboratory examination: Secondary | ICD-10-CM | POA: Insufficient documentation

## 2012-10-18 HISTORY — DX: Low back pain, unspecified: M54.50

## 2012-10-18 HISTORY — DX: Personal history of other diseases of the digestive system: Z87.19

## 2012-10-18 HISTORY — DX: Low back pain: M54.5

## 2012-10-18 HISTORY — DX: Other chronic pain: G89.29

## 2012-10-18 HISTORY — DX: Gastro-esophageal reflux disease without esophagitis: K21.9

## 2012-10-18 NOTE — Patient Instructions (Addendum)
   Your procedure is scheduled on:  Tuesday, July 8th  Enter through the Main Entrance of [facility] at: 1030 am Pick up the phone at the desk and dial [phone] and inform us of your arrival.  Please call this number if you have any problems the morning of surgery: [phone]  Remember: Do not eat food after midnight: Monday Do not drink clear liquids after: 8 am Tuesday Take these medicines the morning of surgery with a SIP OF WATER: bp meds, xanax if needed, protonix, advair diskus, lasix, tylenol, zyrtec.  Patient to bring albuterol inhaler with her on day of  surgery.  Do not wear jewelry, make-up, or FINGER nail polish No metal in your hair or on your body. Do not wear lotions, powders, perfumes. You may wear deodorant.  Please use your CHG wash as directed prior to surgery.  Do not shave anywhere for at least 12 hours prior to first CHG shower.  Do not bring valuables to the hospital. Contacts, dentures or bridgework may not be worn into surgery.  Leave suitcase in the car. After Surgery it may be brought to your room. For patients being admitted to the hospital, checkout time is 11:00am the day of discharge.  Patients discharged on the day of surgery will not be allowed to drive home.  Home with sister Tyrone Sage.

## 2012-10-24 NOTE — H&P (Signed)
Subjective:    Patient is a 62 y.o. female scheduled for anterior repair for anatomic stress incontinence. She has had outpatient evaluation including urodynamics which confirmed the low leak pressure and stress leakage of urine. The options of repair were discussed with the patient but she stated that she did not want any repair that involved mesh. She is now being brought in for an anterior repair in an effort to help improve her continence.    Last pap: normal Date: 09/15/2012    Menstrual History: OB History   Grav Para Term Preterm Abortions TAB SAB Ect Mult Living                   No LMP recorded. Patient has had a hysterectomy.      Review of Systems  Respiratory: no cough or SOB GI: No nausea, vomiting, diarrhea or constipation GU: no dysuria, frequency or urgency Gyn: no bleeding, discharge or itch. Has had hysterectomy  Objective:    There were no vitals taken for this visit.  General:   alert and cooperative  Skin:   normal and no rash or abnormalities  HEENT:  PERRLA  Lungs:   clear to auscultation bilaterally  Heart:   regular rate and rhythm, S1, S2 normal, no murmur, click, rub or gallop     Abdomen:  soft, non-tender; bowel sounds normal; no masses,  no organomegaly  Pelvis: External genitalia: within normal limits BUS: has urethrocele Vagina: small cystocele, no rectocele or enterocele Adnexa: no tender without mass                                Assessment:    Urinary stress incontinence with urethrocele in patient who desires repair without use of mesh.  Will proceed with anterior colporrhaphy and repair of urethrocele   Plan:    Counseling: Procedure, risks, reasons, benefits and complications (including injury to bowel, bladder, major blood vessel, ureter, bleeding, possibility of transfusion, infection, or fistula formation) reviewed in detail. Consent signed. Preop testing ordered. Instructions reviewed, including NPO after  midnight.

## 2012-10-25 ENCOUNTER — Encounter (HOSPITAL_COMMUNITY): Payer: Self-pay | Admitting: Anesthesiology

## 2012-10-25 ENCOUNTER — Ambulatory Visit (HOSPITAL_COMMUNITY)
Admission: RE | Admit: 2012-10-25 | Discharge: 2012-10-26 | Disposition: A | Discharge status: Home / Self Care | Payer: Medicare Other | Source: Ambulatory Visit | Attending: Obstetrics and Gynecology | Admitting: Obstetrics and Gynecology

## 2012-10-25 ENCOUNTER — Encounter (HOSPITAL_COMMUNITY)
Admission: RE | Disposition: A | Discharge status: Home / Self Care | Payer: Self-pay | Source: Ambulatory Visit | Attending: Obstetrics and Gynecology

## 2012-10-25 ENCOUNTER — Ambulatory Visit (HOSPITAL_COMMUNITY): Payer: Medicare Other | Admitting: Anesthesiology

## 2012-10-25 ENCOUNTER — Encounter (HOSPITAL_COMMUNITY): Payer: Self-pay

## 2012-10-25 DIAGNOSIS — N393 Stress incontinence (female) (male): Secondary | ICD-10-CM | POA: Insufficient documentation

## 2012-10-25 DIAGNOSIS — N993 Prolapse of vaginal vault after hysterectomy: Secondary | ICD-10-CM | POA: Insufficient documentation

## 2012-10-25 HISTORY — PX: CYSTOCELE REPAIR: SHX163

## 2012-10-25 SURGERY — COLPORRHAPHY, ANTERIOR, FOR CYSTOCELE REPAIR
Anesthesia: Spinal | Wound class: Clean Contaminated

## 2012-10-25 MED ORDER — MIDAZOLAM HCL 2 MG/2ML IJ SOLN
INTRAMUSCULAR | Status: AC
  Filled 2012-10-25: qty 2

## 2012-10-25 MED ORDER — PROPOFOL 10 MG/ML IV EMUL
INTRAVENOUS | Status: AC
  Filled 2012-10-25: qty 20

## 2012-10-25 MED ORDER — BUPIVACAINE IN DEXTROSE 0.75-8.25 % IT SOLN
INTRATHECAL | Status: DC | PRN
  Administered 2012-10-25: 1.8 mL via INTRATHECAL

## 2012-10-25 MED ORDER — INDIGOTINDISULFONATE SODIUM 8 MG/ML IJ SOLN
INTRAMUSCULAR | Status: AC
  Filled 2012-10-25: qty 5

## 2012-10-25 MED ORDER — LACTATED RINGERS IV SOLN
INTRAVENOUS | Status: DC
  Administered 2012-10-25 (×2): via INTRAVENOUS

## 2012-10-25 MED ORDER — ACETAMINOPHEN 160 MG/5ML PO SOLN: 975.0000 mg | Freq: Once | ORAL | Status: DC

## 2012-10-25 MED ORDER — MIDAZOLAM HCL 5 MG/5ML IJ SOLN
INTRAMUSCULAR | Status: DC | PRN
  Administered 2012-10-25: 2 mg via INTRAVENOUS

## 2012-10-25 MED ORDER — INDIGOTINDISULFONATE SODIUM 8 MG/ML IJ SOLN
INTRAMUSCULAR | Status: DC | PRN
  Administered 2012-10-25: 40 mg via INTRAVENOUS

## 2012-10-25 MED ORDER — IBUPROFEN 600 MG PO TABS
600.0000 mg | ORAL_TABLET | Freq: Four times a day (QID) | ORAL | Status: DC | PRN
  Administered 2012-10-25 – 2012-10-26 (×2): 600 mg via ORAL
  Filled 2012-10-25 (×2): qty 1

## 2012-10-25 MED ORDER — PROPOFOL INFUSION 10 MG/ML OPTIME
INTRAVENOUS | Status: DC | PRN
  Administered 2012-10-25: 75 ug/kg/min via INTRAVENOUS

## 2012-10-25 MED ORDER — MENTHOL 3 MG MT LOZG: 1.0000 | LOZENGE | OROMUCOSAL | Status: DC | PRN

## 2012-10-25 MED ORDER — ALBUTEROL SULFATE HFA 108 (90 BASE) MCG/ACT IN AERS: 2.0000 | INHALATION_SPRAY | Freq: Four times a day (QID) | RESPIRATORY_TRACT | Status: DC | PRN

## 2012-10-25 MED ORDER — KETOROLAC TROMETHAMINE 30 MG/ML IJ SOLN: 30.0000 mg | Freq: Four times a day (QID) | INTRAMUSCULAR | Status: DC | PRN

## 2012-10-25 MED ORDER — FENTANYL CITRATE 0.05 MG/ML IJ SOLN: 25.0000 ug | INTRAMUSCULAR | Status: DC | PRN

## 2012-10-25 MED ORDER — ACETAMINOPHEN 160 MG/5ML PO SOLN
ORAL | Status: AC
  Filled 2012-10-25: qty 40.6

## 2012-10-25 MED ORDER — CEFAZOLIN SODIUM-DEXTROSE 2-3 GM-% IV SOLR
2.0000 g | INTRAVENOUS | Status: AC
  Administered 2012-10-25: 2 g via INTRAVENOUS

## 2012-10-25 MED ORDER — CEFAZOLIN SODIUM-DEXTROSE 2-3 GM-% IV SOLR
INTRAVENOUS | Status: AC
  Filled 2012-10-25: qty 50

## 2012-10-25 MED ORDER — METOPROLOL TARTRATE 50 MG PO TABS
50.0000 mg | ORAL_TABLET | Freq: Two times a day (BID) | ORAL | Status: DC
  Administered 2012-10-25: 50 mg via ORAL
  Filled 2012-10-25 (×2): qty 1

## 2012-10-25 MED ORDER — ALPRAZOLAM 0.5 MG PO TABS
0.5000 mg | ORAL_TABLET | Freq: Every evening | ORAL | Status: DC | PRN
  Administered 2012-10-25: 0.5 mg via ORAL
  Filled 2012-10-25: qty 1

## 2012-10-25 MED ORDER — LACTATED RINGERS IV SOLN
INTRAVENOUS | Status: DC
  Administered 2012-10-25: 22:00:00 via INTRAVENOUS

## 2012-10-25 MED ORDER — LIDOCAINE HCL (CARDIAC) 20 MG/ML IV SOLN
INTRAVENOUS | Status: AC
  Filled 2012-10-25: qty 5

## 2012-10-25 MED ORDER — ACETAMINOPHEN 10 MG/ML IV SOLN: 1000.0000 mg | Freq: Once | INTRAVENOUS | Status: DC | PRN

## 2012-10-25 MED ORDER — LIDOCAINE-EPINEPHRINE 1 %-1:100000 IJ SOLN
INTRAMUSCULAR | Status: DC | PRN
  Administered 2012-10-25: 9 mL

## 2012-10-25 MED ORDER — BUTORPHANOL TARTRATE 1 MG/ML IJ SOLN
1.0000 mg | INTRAMUSCULAR | Status: DC | PRN
  Filled 2012-10-25: qty 1

## 2012-10-25 MED ORDER — LIDOCAINE IN DEXTROSE 5-7.5 % IV SOLN
INTRAVENOUS | Status: DC | PRN
  Administered 2012-10-25: 30 mL via INTRATHECAL

## 2012-10-25 SURGICAL SUPPLY — 23 items
BLADE SURG 15 STRL LF C SS BP (BLADE) ×1 IMPLANT
BLADE SURG 15 STRL SS (BLADE) ×1
CANISTER SUCTION 2500CC (MISCELLANEOUS) ×2 IMPLANT
CLOTH BEACON ORANGE TIMEOUT ST (SAFETY) ×2 IMPLANT
DECANTER SPIKE VIAL GLASS SM (MISCELLANEOUS) IMPLANT
GAUZE PACKING IODOFORM 1 (PACKING) ×2 IMPLANT
GLOVE BIO SURGEON STRL SZ7.5 (GLOVE) ×4 IMPLANT
GOWN PREVENTION PLUS XXLARGE (GOWN DISPOSABLE) ×2 IMPLANT
GOWN STRL REIN XL XLG (GOWN DISPOSABLE) ×6 IMPLANT
NEEDLE HYPO 22GX1.5 SAFETY (NEEDLE) IMPLANT
NS IRRIG 1000ML POUR BTL (IV SOLUTION) ×2 IMPLANT
PACK VAGINAL WOMENS (CUSTOM PROCEDURE TRAY) ×2 IMPLANT
SUT VIC AB 0 CT1 18XCR BRD8 (SUTURE) IMPLANT
SUT VIC AB 0 CT1 27 (SUTURE) ×1
SUT VIC AB 0 CT1 27XBRD ANBCTR (SUTURE) ×1 IMPLANT
SUT VIC AB 0 CT1 8-18 (SUTURE)
SUT VIC AB 2-0 CT1 27 (SUTURE)
SUT VIC AB 2-0 CT1 TAPERPNT 27 (SUTURE) IMPLANT
SUT VIC AB 2-0 SH 27 (SUTURE)
SUT VIC AB 2-0 SH 27XBRD (SUTURE) IMPLANT
TOWEL OR 17X24 6PK STRL BLUE (TOWEL DISPOSABLE) ×4 IMPLANT
TRAY FOLEY CATH 14FR (SET/KITS/TRAYS/PACK) ×2 IMPLANT
WATER STERILE IRR 1000ML POUR (IV SOLUTION) ×2 IMPLANT

## 2012-10-25 NOTE — Anesthesia Procedure Notes (Signed)
Spinal  Patient location during procedure: OR Start time: 10/25/2012 12:06 PM Staffing Anesthesiologist: Angus Seller., Harrell Gave. Performed by: anesthesiologist  Preanesthetic Checklist Completed: patient identified, site marked, surgical consent, pre-op evaluation, timeout performed, IV checked, risks and benefits discussed and monitors and equipment checked Spinal Block Patient position: sitting Prep: DuraPrep Patient monitoring: heart rate, cardiac monitor, continuous pulse ox and blood pressure Approach: midline Location: L3-4 Injection technique: single-shot Needle Needle type: Sprotte  Needle gauge: 24 G Needle length: 9 cm Assessment Sensory level: T4 Additional Notes Patient identified.  Risk benefits discussed including failed block, incomplete pain control, headache, nerve damage, paralysis, blood pressure changes, nausea, vomiting, reactions to medication both toxic or allergic, and postpartum back pain.  Patient expressed understanding and wished to proceed.  All questions were answered.  Sterile technique used throughout procedure.  CSF was clear.  No parasthesia or other complications.  Please see nursing notes for vital signs.

## 2012-10-25 NOTE — Transfer of Care (Signed)
Immediate Anesthesia Transfer of Care Note  Patient: Andrea Perry  Procedure(s) Performed: Procedure(s): ANTERIOR REPAIR (CYSTOCELE) (N/A)  Patient Location: PACU  Anesthesia Type:Spinal  Level of Consciousness: awake, alert  and oriented  Airway & Oxygen Therapy: Patient Spontanous Breathing  Post-op Assessment: Report given to PACU RN and Post -op Vital signs reviewed and stable  Post vital signs: Reviewed and stable  Complications: No apparent anesthesia complications

## 2012-10-25 NOTE — Anesthesia Preprocedure Evaluation (Addendum)
Anesthesia Evaluation  Patient identified by MRN, date of birth, ID band Patient awake    Reviewed: Allergy & Precautions, H&P , NPO status , Patient's Chart, lab work & pertinent test results, reviewed documented beta blocker date and time   History of Anesthesia Complications Negative for: history of anesthetic complications  Airway Mallampati: II TM Distance: >3 FB Neck ROM: full    Dental no notable dental hx. (+) Teeth Intact   Pulmonary shortness of breath, with exertion and lying, asthma (uses albuterol at least once a day) , sleep apnea (not tested - but family reports she snores and she reports waking up gasping) , COPD COPD inhaler, former smoker (2005),  breath sounds clear to auscultation  Pulmonary exam normal       Cardiovascular hypertension (172/82 today, took beta blocker), On Home Beta Blockers and On Medications Rhythm:regular Rate:Normal  Denies chest pain   Neuro/Psych PSYCHIATRIC DISORDERS (depression, panic attacks, anxiety) Chronic low back pain    GI/Hepatic Neg liver ROS, hiatal hernia, GERD-  Medicated,  Endo/Other  Morbid obesity  Renal/GU negative Renal ROS Bladder dysfunction      Musculoskeletal   Abdominal Normal abdominal exam  (+)   Peds  Hematology negative hematology ROS (+)   Anesthesia Other Findings   Reproductive/Obstetrics negative OB ROS                          Anesthesia Physical Anesthesia Plan  ASA: III  Anesthesia Plan: Spinal   Post-op Pain Management:    Induction:   Airway Management Planned:   Additional Equipment:   Intra-op Plan:   Post-operative Plan:   Informed Consent: I have reviewed the patients History and Physical, chart, labs and discussed the procedure including the risks, benefits and alternatives for the proposed anesthesia with the patient or authorized representative who has indicated his/her understanding and  acceptance.   Dental Advisory Given  Plan Discussed with: CRNA and Surgeon  Anesthesia Plan Comments:         Anesthesia Quick Evaluation

## 2012-10-25 NOTE — Anesthesia Postprocedure Evaluation (Signed)
Anesthesia Post Note  Patient: Andrea Perry  Procedure(s) Performed: Procedure(s) (LRB): ANTERIOR REPAIR (CYSTOCELE) (N/A)  Anesthesia type: Spinal  Patient location: PACU  Post pain: Pain level controlled  Post assessment: Post-op Vital signs reviewed  Last Vitals:  Filed Vitals:   10/25/12 1300  BP: 147/64  Pulse: 63  Temp:   Resp: 26    Post vital signs: Reviewed  Level of consciousness: awake  Complications: No apparent anesthesia complications

## 2012-10-25 NOTE — Brief Op Note (Signed)
10/25/2012  12:42 PM  PATIENT:  Andrea Perry  62 y.o. female  PRE-OPERATIVE DIAGNOSIS:  Stress Urinary Incontinence 16109  POST-OPERATIVE DIAGNOSIS:  Stress Urinary Incontinence 60454  PROCEDURE:  Procedure(s): ANTERIOR REPAIR (CYSTOCELE) (N/A)  SURGEON:  Surgeon(s) and Role:    * Miguel Aschoff, MD - Primary    * Philip Aspen, DO - Assisting   ANESTHESIA:   spinal  EBL:  Total I/O In: 1000 [I.V.:1000] Out: 750 [Urine:750]  BLOOD ADMINISTERED:none  DRAINS: Urinary Catheter (Foley)   LOCAL MEDICATIONS USED:  LIDOCAINE   SPECIMEN:  No Specimen  DISPOSITION OF SPECIMEN:  N/A  COUNTS:  YES  TOURNIQUET:  * No tourniquets in log *  DICTATION: .Other Dictation: Dictation Number [id number]  PLAN OF CARE: Admit for overnight observation  PATIENT DISPOSITION:  PACU - hemodynamically stable.   Delay start of Pharmacological VTE agent (>24hrs) due to surgical blood loss or risk of bleeding: PAS hose applied

## 2012-10-25 NOTE — Op Note (Signed)
Andrea Perry, Andrea Perry                 ACCOUNT NO.:  [id number]  MEDICAL RECORD NO.:  [id number]  LOCATION:  9315                          FACILITY:  [facility]  PHYSICIAN:  Miguel Aschoff, M.D.       DATE OF BIRTH:  1950-05-02  DATE OF PROCEDURE:  10/25/2012 DATE OF DISCHARGE:                              OPERATIVE REPORT   PREOPERATIVE DIAGNOSIS:  Urinary stress incontinence with urethrocele.  POSTOPERATIVE DIAGNOSIS:  Urinary stress incontinence with urethrocele.  PROCEDURE:  Repair of urethrocele.  SURGEON:  Miguel Aschoff, M.D.  ANESTHESIA:  Spinal.  COMPLICATIONS:  None.  JUSTIFICATION:  The patient is a 62 year old, white female with history of urinary stress incontinence.  The patient has had a prior hysterectomy.  Urodynamic testing revealed a low leak point pressure and examination revealed rotational descent of the urethra with loss of the vesicourethral angle.  Options of repair were discussed with the patient but the patient did not want a mesh procedure carried out.  She is therefore being brought to the operating room at this time to undergo repair of the urethrocele with a Kelly type plication repair in an effort to correct her urinary stress incontinence.  The risks and benefits of procedure had been discussed with the patient.  Informed consent has been obtained.  DESCRIPTION OF PROCEDURE:  The patient was taken to the operating room. Spinal anesthesia was administered without difficulty, and then she was placed in the dorsal lithotomy position, and prepped and draped in usual sterile fashion.  Foley catheter was inserted.  At this point, with the patient in high lithotomy position, the junction of the urethra and bladder were identified.  The mucosa overlying the urethra and just below the urethrovesical angle was injected with dilute lidocaine with epinephrine for hemostasis.  The mucosa was then incised and separated from the underlying perivesical and periurethral  fascia.  Once this was done, urethrocele was outlined and repaired using several 0 Vicryl sutures placed in Kelly type plication manner.  Elevating the urethra and reconstruct the urethrovesical angle.  After the ureterocele had been repaired and appeared to be good elevation of the urethra, the excess vaginal mucosa was trimmed and the anterior vaginal wall was repaired using running interlocking 2-0 Vicryl suture.  An Iodoform pack was then placed.  The patient was taken out of lithotomy position, brought to recovery room in satisfactory condition.  ESTIMATED BLOOD LOSS:  Approximately 20 mL.  PLAN:  For the patient to be observed overnight.  It is anticipated that she will be sent home on October 26, 2012.     Miguel Aschoff, M.D.     AR/MEDQ  D:  10/25/2012  T:  10/25/2012  Job:  [id number]

## 2012-10-26 ENCOUNTER — Encounter (HOSPITAL_COMMUNITY): Payer: Self-pay | Admitting: Obstetrics and Gynecology

## 2012-10-26 NOTE — Progress Notes (Signed)
Vaginal packing removed. Moderate amount of blood noted on packing.  Pt tolerated well. 

## 2012-10-26 NOTE — Progress Notes (Signed)
S: Doing well this AM. Has voided without catheter. Not having any pain.  O: Afebrile  BP 155/88  Abdomen is soft non tender.  Impression: Stable post op.  Plan: D/C home  RTC in 4 weeks  Resume prior meds             No heavy lifting, nothing per vagina             Condition: Improved

## 2013-01-18 ENCOUNTER — Other Ambulatory Visit: Payer: Self-pay | Admitting: Internal Medicine

## 2013-01-30 ENCOUNTER — Other Ambulatory Visit: Payer: Self-pay | Admitting: Internal Medicine

## 2013-02-02 ENCOUNTER — Other Ambulatory Visit: Payer: Self-pay | Admitting: Internal Medicine

## 2013-02-23 ENCOUNTER — Telehealth: Payer: Self-pay | Admitting: *Deleted

## 2013-02-23 NOTE — Telephone Encounter (Signed)
Would be ok to make nurse visit for this, but remember can cost up to $250 if not covered by insurance

## 2013-02-23 NOTE — Telephone Encounter (Signed)
Pt called requesting whether she is due the Zostavax.  Unable to contact pt to advise Zostavax due 9.2014.  Please advise

## 2013-02-24 NOTE — Telephone Encounter (Signed)
Spoke with pt advised of MDs message 

## 2013-02-27 ENCOUNTER — Telehealth: Payer: Self-pay | Admitting: *Deleted

## 2013-02-27 NOTE — Telephone Encounter (Signed)
Pt called states she has developed Sinus pressure and congestion again.  She is requesting an antibiotic be called in.  She is aware Dr Jonny Ruiz does not work on Mondays however she said she would wait til he returns.  Please advise

## 2013-02-27 NOTE — Telephone Encounter (Signed)
Please make OV as per office policy to make sure antibx are warranted

## 2013-02-28 NOTE — Telephone Encounter (Signed)
Spoke with pt advised of MDs message 

## 2013-03-01 ENCOUNTER — Encounter: Payer: Self-pay | Admitting: Internal Medicine

## 2013-03-01 ENCOUNTER — Ambulatory Visit (INDEPENDENT_AMBULATORY_CARE_PROVIDER_SITE_OTHER): Payer: Medicare Other | Admitting: Internal Medicine

## 2013-03-01 VITALS — BP 152/90 | HR 64 | Temp 98.5°F | Ht 62.0 in | Wt 246.1 lb

## 2013-03-01 DIAGNOSIS — Z23 Encounter for immunization: Secondary | ICD-10-CM

## 2013-03-01 DIAGNOSIS — J441 Chronic obstructive pulmonary disease with (acute) exacerbation: Secondary | ICD-10-CM

## 2013-03-01 DIAGNOSIS — I10 Essential (primary) hypertension: Secondary | ICD-10-CM

## 2013-03-01 DIAGNOSIS — J209 Acute bronchitis, unspecified: Secondary | ICD-10-CM

## 2013-03-01 MED ORDER — PREDNISONE 10 MG PO TABS: ORAL_TABLET | ORAL | Status: DC

## 2013-03-01 MED ORDER — LEVOFLOXACIN 250 MG PO TABS: 250.0000 mg | ORAL_TABLET | Freq: Every day | ORAL | Status: DC

## 2013-03-01 MED ORDER — HYDROCODONE-HOMATROPINE 5-1.5 MG/5ML PO SYRP: 5.0000 mL | ORAL_SOLUTION | Freq: Four times a day (QID) | ORAL | Status: DC | PRN

## 2013-03-01 NOTE — Patient Instructions (Signed)
Please take all new medication as prescribed  Please continue all other medications as before, and refills have been done if requested.  Please have the pharmacy call with any other refills you may need.   

## 2013-03-01 NOTE — Progress Notes (Signed)
Subjective:    Patient ID: Andrea Perry, female    DOB: [birth year], 62 y.o.   MRN: [id number]  HPI  Here with acute onset mild to mod 2-3 days ST, HA, general weakness and malaise, with prod cough greenish sputum, but Pt denies chest pain, increased sob or doe, wheezing, orthopnea, PND, increased LE swelling, palpitations, dizziness or syncope, except for onset wheezing last PM.  Pt denies new neurological symptoms such as new headache, or facial or extremity weakness or numbness   Pt denies polydipsia, polyuria,  Past Medical History  Diagnosis Date  . Impaired glucose tolerance 09/21/2010  . ALLERGIC RHINITIS 08/16/2008  . ANXIETY 08/16/2008  . ASTHMA 08/16/2008  . COPD 08/16/2008  . DEPRESSION 08/16/2008  . GENITAL HERPES 12/03/2006  . HEMATOCHEZIA 06/20/2009  . HYPERLIPIDEMIA 08/16/2008  . HYPERTENSION 12/03/2006  . IBS 06/20/2009    hx per patient, no current problem  . Hemorrhoids   . HYPOTHYROIDISM 12/03/2006    History only - no current problem  . GERD (gastroesophageal reflux disease)   . H/O hiatal hernia   . Arthritis     hands, knees, lower back  . 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 Appendectomy    . Knee surgery      Right   . Tonsillectomy and adenoidectomy    . Abdominal hysterectomy    . Carpal tunnel release      Right   . Cesarean section      x 1   . Colonoscopy  2004  . Cystocele repair N/A 10/25/2012    Procedure: ANTERIOR REPAIR (CYSTOCELE);  Surgeon: Miguel Aschoff, MD;  Location: [facility];  Service: Gynecology;  Laterality: N/A;    reports that she quit smoking about 9 years ago. Her smoking use included Cigarettes. She has a 45 pack-year smoking history. She has never used smokeless tobacco. She reports that she does not drink alcohol or use illicit drugs. family history includes Colon polyps in her mother; Diabetes in her mother; Hyperlipidemia in her mother; Hypertension in her mother; Stroke in  her father. There is no history of Colon cancer. Allergies  Allergen Reactions  . Codeine Nausea And Vomiting  . Tramadol Hcl Nausea And Vomiting    REACTION: mouth dryness, headache  . Vicodin [Hydrocodone-Acetaminophen] Nausea And Vomiting   Current Outpatient Prescriptions on File Prior to Visit  Medication Sig Dispense Refill  . acetaminophen (TYLENOL) 650 MG CR tablet Take 1,300 mg by mouth 2 (two) times daily.      Marland Kitchen albuterol (VENTOLIN HFA) 108 (90 BASE) MCG/ACT inhaler Inhale 2 puffs into the lungs every 6 (six) hours as needed.  3 Inhaler  3  . ALPRAZolam (XANAX) 1 MG tablet Take 1 mg by mouth at bedtime.      Marland Kitchen aspirin 81 MG EC tablet Take 81 mg by mouth daily.        Marland Kitchen b complex vitamins tablet Take 1 tablet by mouth daily.      . Bepotastine Besilate (BEPREVE) 1.5 % SOLN Place 1 drop into both eyes daily as needed (for allergies).      . cetirizine (ZYRTEC) 10 MG tablet Take 10 mg by mouth daily.       . cholecalciferol (VITAMIN D) 1000 UNITS tablet Take 1,000 Units by mouth daily.      Marland Kitchen escitalopram (LEXAPRO) 10 MG tablet Take 10 mg by mouth at bedtime.      Marland Kitchen  escitalopram (LEXAPRO) 10 MG tablet TAKE ONE TABLET BY MOUTH EVERY DAY  90 tablet  3  . estradiol (ESTRACE) 2 MG tablet TAKE ONE TABLET BY MOUTH EVERY DAY  90 tablet  3  . Fluticasone-Salmeterol (ADVAIR DISKUS) 250-50 MCG/DOSE AEPB Inhale 1 puff into the lungs 2 (two) times daily.  3 each  3  . furosemide (LASIX) 40 MG tablet TAKE ONE TABLET BY MOUTH EVERY DAY  90 tablet  3  . Garlic 1000 MG CAPS Take 1 capsule by mouth at bedtime.       . hydrocortisone (ANUSOL-HC) 25 MG suppository Place 1 suppository (25 mg total) rectally 2 (two) times daily as needed.  12 suppository  1  . metoprolol (LOPRESSOR) 50 MG tablet TAKE ONE TABLET BY MOUTH TWICE DAILY  180 tablet  3  . Multiple Vitamin (MULTIVITAMIN) tablet Take 1 tablet by mouth daily.        . Omega-3 Fatty Acids (FISH OIL) 1200 MG CAPS Take 1 capsule by mouth daily.        . pantoprazole (PROTONIX) 40 MG tablet TAKE ONE TABLET BY MOUTH EVERY DAY  90 tablet  3  . pantoprazole (PROTONIX) 40 MG tablet TAKE ONE TABLET BY MOUTH EVERY DAY  90 tablet  1  . potassium gluconate 595 MG TABS Take 595 mg by mouth every other day.      . pravastatin (PRAVACHOL) 40 MG tablet Take 40 mg by mouth at bedtime.      . pravastatin (PRAVACHOL) 40 MG tablet TAKE ONE TABLET BY MOUTH EVERY DAY  90 tablet  1  . vitamin C (ASCORBIC ACID) 500 MG tablet Take 500 mg by mouth daily as needed (for cold symptoms).      . vitamin E 400 UNIT capsule Take 400 Units by mouth daily.       No current facility-administered medications on file prior to visit.   Review of Systems  Constitutional: Negative for unexpected weight change, or unusual diaphoresis  HENT: Negative for tinnitus.   Eyes: Negative for photophobia and visual disturbance.  Respiratory: Negative for choking and stridor.   Gastrointestinal: Negative for vomiting and blood in stool.  Genitourinary: Negative for hematuria and decreased urine volume.  Musculoskeletal: Negative for acute joint swelling Skin: Negative for color change and wound.  Neurological: Negative for tremors and numbness other than noted  Psychiatric/Behavioral: Negative for decreased concentration or  hyperactivity.       Objective:   Physical Exam BP 152/90  Pulse 64  Temp(Src) 98.5 F (36.9 C) (Oral)  Ht 5\' 2"  (1.575 m)  Wt 246 lb 2 oz (111.642 kg)  BMI 45.01 kg/m2  SpO2 97% VS noted, mild ill Constitutional: Pt appears well-developed and well-nourished.  HENT: Head: NCAT.  Right Ear: External ear normal.  Left Ear: External ear normal.  Bilat tm's with mild erythema.  Max sinus areas mild tender.  Pharynx with mild erythema, no exudate Eyes: Conjunctivae and EOM are normal. Pupils are equal, round, and reactive to light.  Neck: Normal range of motion. Neck supple.  Cardiovascular: Normal rate and regular rhythm.   Pulmonary/Chest: Effort  normal and breath sounds decreased with mild wheeze bilat.  Neurological: Pt is alert. Not confused  Skin: Skin is warm. No erythema.  Psychiatric: Pt behavior is normal. Thought content normal.     Assessment & Plan:

## 2013-03-01 NOTE — Assessment & Plan Note (Signed)
Mild, for oral predpack asd,  to f/u any worsening symptoms or concerns

## 2013-03-01 NOTE — Assessment & Plan Note (Signed)
Increase today likely situational, Please continue all other medications as before,  to f/u any worsening symptoms or concerns BP Readings from Last 3 Encounters:  03/01/13 152/90  10/26/12 158/88  10/26/12 158/88

## 2013-03-01 NOTE — Progress Notes (Signed)
Pre-visit discussion using our clinic review tool. No additional management support is needed unless otherwise documented below in the visit note.  

## 2013-03-01 NOTE — Assessment & Plan Note (Signed)
Mild to mod, for antibx course,  to f/u any worsening symptoms or concerns 

## 2013-03-30 ENCOUNTER — Ambulatory Visit (INDEPENDENT_AMBULATORY_CARE_PROVIDER_SITE_OTHER): Payer: Medicare Other | Admitting: Internal Medicine

## 2013-03-30 ENCOUNTER — Other Ambulatory Visit: Payer: Self-pay | Admitting: Internal Medicine

## 2013-03-30 ENCOUNTER — Encounter: Payer: Self-pay | Admitting: Internal Medicine

## 2013-03-30 VITALS — BP 160/102 | HR 72 | Temp 97.0°F | Ht 62.0 in | Wt 249.2 lb

## 2013-03-30 DIAGNOSIS — I10 Essential (primary) hypertension: Secondary | ICD-10-CM

## 2013-03-30 DIAGNOSIS — F411 Generalized anxiety disorder: Secondary | ICD-10-CM

## 2013-03-30 DIAGNOSIS — J449 Chronic obstructive pulmonary disease, unspecified: Secondary | ICD-10-CM

## 2013-03-30 DIAGNOSIS — R7309 Other abnormal glucose: Secondary | ICD-10-CM

## 2013-03-30 DIAGNOSIS — Z23 Encounter for immunization: Secondary | ICD-10-CM

## 2013-03-30 DIAGNOSIS — Z0000 Encounter for general adult medical examination without abnormal findings: Secondary | ICD-10-CM

## 2013-03-30 DIAGNOSIS — R7302 Impaired glucose tolerance (oral): Secondary | ICD-10-CM

## 2013-03-30 MED ORDER — AMLODIPINE BESYLATE 5 MG PO TABS: 5.0000 mg | ORAL_TABLET | Freq: Every day | ORAL | Status: DC

## 2013-03-30 MED ORDER — LORAZEPAM 1 MG PO TABS: 1.0000 mg | ORAL_TABLET | Freq: Every evening | ORAL | Status: DC | PRN

## 2013-03-30 NOTE — Patient Instructions (Addendum)
You had the new Prevnar pneumonia shot  Please take all new medication as prescribed  - the amlodipine 5 mg per day, and the lorazepam at bedtime  OK to stop the xanax  Please continue all other medications as before  You will be contacted regarding the referral for: colonoscopy  No further lab work needed today  Please return in 6 months, or sooner if needed, with Lab testing done 3-5 days before

## 2013-03-30 NOTE — Progress Notes (Signed)
Pre-visit discussion using our clinic review tool. No additional management support is needed unless otherwise documented below in the visit note.  

## 2013-03-30 NOTE — Progress Notes (Signed)
Subjective:    Patient ID: Andrea Perry, female    DOB: November 01, 1950, 62 y.o.   MRN: [id number]  HPI  Here to f/u; overall doing ok,  Pt denies chest pain, increased sob or doe, wheezing, orthopnea, PND, increased LE swelling, palpitations, dizziness or syncope.  Pt denies polydipsia, polyuria, or low sugar symptoms such as weakness or confusion improved with po intake.  Pt denies new neurological symptoms such as new headache, or facial or extremity weakness or numbness.   Pt states overall good compliance with meds, has been trying to follow lower cholesterol diet, with wt overall stable,  but little exercise however. Ins no longer pays for xanax.  Due for colonoscopy Past Medical History  Diagnosis Date  . Impaired glucose tolerance 09/21/2010  . ALLERGIC RHINITIS 08/16/2008  . ANXIETY 08/16/2008  . ASTHMA 08/16/2008  . COPD 08/16/2008  . DEPRESSION 08/16/2008  . GENITAL HERPES 12/03/2006  . HEMATOCHEZIA 06/20/2009  . HYPERLIPIDEMIA 08/16/2008  . HYPERTENSION 12/03/2006  . IBS 06/20/2009    hx per patient, no current problem  . Hemorrhoids   . HYPOTHYROIDISM 12/03/2006    History only - no current problem  . GERD (gastroesophageal reflux disease)   . H/O hiatal hernia   . Arthritis     hands, knees, lower back  . 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 Appendectomy    . Knee surgery      Right   . Tonsillectomy and adenoidectomy    . Abdominal hysterectomy    . Carpal tunnel release      Right   . Cesarean section      x 1   . Colonoscopy  2004  . Cystocele repair N/A 10/25/2012    Procedure: ANTERIOR REPAIR (CYSTOCELE);  Surgeon: Miguel Aschoff, MD;  Location: [facility];  Service: Gynecology;  Laterality: N/A;    reports that she quit smoking about 9 years ago. Her smoking use included Cigarettes. She has a 45 pack-year smoking history. She has never used smokeless tobacco. She reports that she does not drink alcohol or use  illicit drugs. family history includes Colon polyps in her mother; Diabetes in her mother; Hyperlipidemia in her mother; Hypertension in her mother; Stroke in her father. There is no history of Colon cancer. Allergies  Allergen Reactions  . Codeine Nausea And Vomiting  . Tramadol Hcl Nausea And Vomiting    REACTION: mouth dryness, headache  . Vicodin [Hydrocodone-Acetaminophen] Nausea And Vomiting   Current Outpatient Prescriptions on File Prior to Visit  Medication Sig Dispense Refill  . acetaminophen (TYLENOL) 650 MG CR tablet Take 1,300 mg by mouth 2 (two) times daily.      Marland Kitchen albuterol (VENTOLIN HFA) 108 (90 BASE) MCG/ACT inhaler Inhale 2 puffs into the lungs every 6 (six) hours as needed.  3 Inhaler  3  . aspirin 81 MG EC tablet Take 81 mg by mouth daily.        Marland Kitchen b complex vitamins tablet Take 1 tablet by mouth daily.      . Bepotastine Besilate (BEPREVE) 1.5 % SOLN Place 1 drop into both eyes daily as needed (for allergies).      . cetirizine (ZYRTEC) 10 MG tablet Take 10 mg by mouth daily.       . cholecalciferol (VITAMIN D) 1000 UNITS tablet Take 1,000 Units by mouth daily.      Marland Kitchen escitalopram (LEXAPRO)  10 MG tablet TAKE ONE TABLET BY MOUTH EVERY DAY  90 tablet  3  . estradiol (ESTRACE) 2 MG tablet TAKE ONE TABLET BY MOUTH EVERY DAY  90 tablet  3  . Fluticasone-Salmeterol (ADVAIR DISKUS) 250-50 MCG/DOSE AEPB Inhale 1 puff into the lungs 2 (two) times daily.  3 each  3  . furosemide (LASIX) 40 MG tablet TAKE ONE TABLET BY MOUTH EVERY DAY  90 tablet  3  . Garlic 1000 MG CAPS Take 1 capsule by mouth at bedtime.       . hydrocortisone (ANUSOL-HC) 25 MG suppository Place 1 suppository (25 mg total) rectally 2 (two) times daily as needed.  12 suppository  1  . metoprolol (LOPRESSOR) 50 MG tablet TAKE ONE TABLET BY MOUTH TWICE DAILY  180 tablet  3  . Multiple Vitamin (MULTIVITAMIN) tablet Take 1 tablet by mouth daily.        . Omega-3 Fatty Acids (FISH OIL) 1200 MG CAPS Take 1 capsule by  mouth daily.       . pantoprazole (PROTONIX) 40 MG tablet TAKE ONE TABLET BY MOUTH EVERY DAY  90 tablet  1  . potassium gluconate 595 MG TABS Take 595 mg by mouth every other day.      . pravastatin (PRAVACHOL) 40 MG tablet TAKE ONE TABLET BY MOUTH EVERY DAY  90 tablet  1  . vitamin C (ASCORBIC ACID) 500 MG tablet Take 500 mg by mouth daily as needed (for cold symptoms).      . vitamin E 400 UNIT capsule Take 400 Units by mouth daily.       No current facility-administered medications on file prior to visit.   Review of Systems  Constitutional: Negative for unexpected weight change, or unusual diaphoresis  HENT: Negative for tinnitus.   Eyes: Negative for photophobia and visual disturbance.  Respiratory: Negative for choking and stridor.   Gastrointestinal: Negative for vomiting and blood in stool.  Genitourinary: Negative for hematuria and decreased urine volume.  Musculoskeletal: Negative for acute joint swelling Skin: Negative for color change and wound.  Neurological: Negative for tremors and numbness other than noted  Psychiatric/Behavioral: Negative for decreased concentration or  hyperactivity.       Objective:   Physical Exam BP 160/102  Pulse 72  Temp(Src) 97 F (36.1 C) (Oral)  Ht 5\' 2"  (1.575 m)  Wt 249 lb 4 oz (113.059 kg)  BMI 45.58 kg/m2  SpO2 94% VS noted,  Constitutional: Pt appears well-developed and well-nourished.  HENT: Head: NCAT.  Right Ear: External ear normal.  Left Ear: External ear normal.  Eyes: Conjunctivae and EOM are normal. Pupils are equal, round, and reactive to light.  Neck: Normal range of motion. Neck supple.  Cardiovascular: Normal rate and regular rhythm.   Pulmonary/Chest: Effort normal and breath sounds normal.  Abd:  Soft, NT, non-distended, + BS Neurological: Pt is alert. Not confused  Skin: Skin is warm. No erythema.  Psychiatric: Pt behavior is normal. Thought content normal. 1+ nervous    Assessment & Plan:

## 2013-04-02 NOTE — Assessment & Plan Note (Signed)
stable overall by history and exam, recent data reviewed with pt, and pt to continue medical treatment as before,  to f/u any worsening symptoms or concerns SpO2 Readings from Last 3 Encounters:  03/30/13 94%  03/01/13 97%  10/26/12 95%

## 2013-04-02 NOTE — Assessment & Plan Note (Signed)
To add amlodipine 5 qd, f/u bp at home and next visit,  to f/u any worsening symptoms or concerns

## 2013-04-02 NOTE — Assessment & Plan Note (Signed)
Ok for change xanax to lorazepam asd,  to f/u any worsening symptoms or concerns

## 2013-04-02 NOTE — Assessment & Plan Note (Signed)
stable overall by history and exam, recent data reviewed with pt, and pt to continue medical treatment as before,  to f/u any worsening symptoms or concerns Lab Results  Component Value Date   HGBA1C 6.0 09/27/2012

## 2013-04-10 ENCOUNTER — Other Ambulatory Visit: Payer: Self-pay | Admitting: Ophthalmology

## 2013-04-11 ENCOUNTER — Telehealth: Payer: Self-pay

## 2013-04-11 MED ORDER — BENZONATATE 100 MG PO CAPS: ORAL_CAPSULE | ORAL | Status: DC

## 2013-04-11 NOTE — Telephone Encounter (Signed)
The patient called and is hoping to get an rx for a cough.  She states she has had a cough and is hoping to avoid coming in for an ov.

## 2013-04-11 NOTE — Telephone Encounter (Signed)
Spoke with pt advised of MDs message 

## 2013-04-11 NOTE — Telephone Encounter (Signed)
Since we're not sure of the cause of the cough, I can order tessalon perle, but would need OV if this , or OTC delsym does not help

## 2013-04-16 ENCOUNTER — Other Ambulatory Visit: Payer: Self-pay | Admitting: Internal Medicine

## 2013-04-17 NOTE — Telephone Encounter (Signed)
Refill done.  

## 2013-04-19 ENCOUNTER — Encounter: Payer: Self-pay | Admitting: Internal Medicine

## 2013-04-19 ENCOUNTER — Ambulatory Visit (INDEPENDENT_AMBULATORY_CARE_PROVIDER_SITE_OTHER): Payer: Medicare Other | Admitting: Internal Medicine

## 2013-04-19 VITALS — BP 130/90 | HR 78 | Temp 97.6°F | Ht 62.0 in | Wt 264.0 lb

## 2013-04-19 DIAGNOSIS — J209 Acute bronchitis, unspecified: Secondary | ICD-10-CM

## 2013-04-19 DIAGNOSIS — J449 Chronic obstructive pulmonary disease, unspecified: Secondary | ICD-10-CM

## 2013-04-19 DIAGNOSIS — R7309 Other abnormal glucose: Secondary | ICD-10-CM

## 2013-04-19 DIAGNOSIS — R7302 Impaired glucose tolerance (oral): Secondary | ICD-10-CM

## 2013-04-19 DIAGNOSIS — I10 Essential (primary) hypertension: Secondary | ICD-10-CM

## 2013-04-19 MED ORDER — HYDROCODONE-HOMATROPINE 5-1.5 MG/5ML PO SYRP: 5.0000 mL | ORAL_SOLUTION | Freq: Four times a day (QID) | ORAL | Status: DC | PRN

## 2013-04-19 MED ORDER — LEVOFLOXACIN 250 MG PO TABS: 250.0000 mg | ORAL_TABLET | Freq: Every day | ORAL | Status: DC

## 2013-04-19 NOTE — Assessment & Plan Note (Signed)
Mild to mod, for antibx course,  to f/u any worsening symptoms or concerns 

## 2013-04-19 NOTE — Assessment & Plan Note (Signed)
stable overall by history and exam, recent data reviewed with pt, and pt to continue medical treatment as before,  to f/u any worsening symptoms or concerns Lab Results  Component Value Date   HGBA1C 6.0 09/27/2012    

## 2013-04-19 NOTE — Patient Instructions (Addendum)
Please take all new medication as prescribed Please continue all other medications as before  Please see Vicie Mutters, office manager, for information regarding your Pneumonia shot billing

## 2013-04-19 NOTE — Assessment & Plan Note (Signed)
stable overall by history and exam, recent data reviewed with pt, and pt to continue medical treatment as before,  to f/u any worsening symptoms or concerns BP Readings from Last 3 Encounters:  04/19/13 130/90  03/30/13 160/102  03/01/13 152/90

## 2013-04-19 NOTE — Progress Notes (Signed)
Subjective:    Patient ID: Andrea Perry, female    DOB: 28-Dec-1950, 62 y.o.   MRN: [id number]  HPI  Here with acute onset mild to mod 2-3 days ST, HA, general weakness and malaise, with prod cough greenish sputum, but Pt denies chest pain, increased sob or doe, wheezing, orthopnea, PND, increased LE swelling, palpitations, dizziness or syncope.  Pt denies polydipsia, polyuria.  Pt denies new neurological symptoms such as new headache, or facial or extremity weakness or numbness Past Medical History  Diagnosis Date  . Impaired glucose tolerance 09/21/2010  . ALLERGIC RHINITIS 08/16/2008  . ANXIETY 08/16/2008  . ASTHMA 08/16/2008  . COPD 08/16/2008  . DEPRESSION 08/16/2008  . GENITAL HERPES 12/03/2006  . HEMATOCHEZIA 06/20/2009  . HYPERLIPIDEMIA 08/16/2008  . HYPERTENSION 12/03/2006  . IBS 06/20/2009    hx per patient, no current problem  . Hemorrhoids   . HYPOTHYROIDISM 12/03/2006    History only - no current problem  . GERD (gastroesophageal reflux disease)   . H/O hiatal hernia   . Arthritis     hands, knees, lower back  . 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 Appendectomy    . Knee surgery      Right   . Tonsillectomy and adenoidectomy    . Abdominal hysterectomy    . Carpal tunnel release      Right   . Cesarean section      x 1   . Colonoscopy  2004  . Cystocele repair N/A 10/25/2012    Procedure: ANTERIOR REPAIR (CYSTOCELE);  Surgeon: Miguel Aschoff, MD;  Location: [facility];  Service: Gynecology;  Laterality: N/A;    reports that she quit smoking about 10 years ago. Her smoking use included Cigarettes. She has a 45 pack-year smoking history. She has never used smokeless tobacco. She reports that she does not drink alcohol or use illicit drugs. family history includes Colon polyps in her mother; Diabetes in her mother; Hyperlipidemia in her mother; Hypertension in her mother; Stroke in her father. There is no history of  Colon cancer. Allergies  Allergen Reactions  . Codeine Nausea And Vomiting  . Tramadol Hcl Nausea And Vomiting    REACTION: mouth dryness, headache  . Vicodin [Hydrocodone-Acetaminophen] Nausea And Vomiting   Current Outpatient Prescriptions on File Prior to Visit  Medication Sig Dispense Refill  . acetaminophen (TYLENOL) 650 MG CR tablet Take 1,300 mg by mouth 2 (two) times daily.      Marland Kitchen albuterol (VENTOLIN HFA) 108 (90 BASE) MCG/ACT inhaler Inhale 2 puffs into the lungs every 6 (six) hours as needed.  3 Inhaler  3  . amLODipine (NORVASC) 5 MG tablet Take 1 tablet (5 mg total) by mouth daily.  30 tablet  11  . aspirin 81 MG EC tablet Take 81 mg by mouth daily.        Marland Kitchen b complex vitamins tablet Take 1 tablet by mouth daily.      . Bepotastine Besilate (BEPREVE) 1.5 % SOLN Place 1 drop into both eyes daily as needed (for allergies).      . cetirizine (ZYRTEC) 10 MG tablet Take 10 mg by mouth daily.       . cholecalciferol (VITAMIN D) 1000 UNITS tablet Take 1,000 Units by mouth daily.      Marland Kitchen escitalopram (LEXAPRO) 10 MG tablet TAKE ONE TABLET BY MOUTH EVERY DAY  90 tablet  3  . estradiol (ESTRACE) 2 MG tablet TAKE ONE TABLET BY MOUTH EVERY DAY  90 tablet  3  . Fluticasone-Salmeterol (ADVAIR DISKUS) 250-50 MCG/DOSE AEPB Inhale 1 puff into the lungs 2 (two) times daily.  3 each  3  . furosemide (LASIX) 40 MG tablet TAKE ONE TABLET BY MOUTH EVERY DAY  90 tablet  3  . Garlic 1000 MG CAPS Take 1 capsule by mouth at bedtime.       Marland Kitchen LORazepam (ATIVAN) 1 MG tablet Take 1 tablet (1 mg total) by mouth at bedtime as needed for anxiety.  30 tablet  5  . metoprolol (LOPRESSOR) 50 MG tablet TAKE ONE TABLET BY MOUTH TWICE DAILY  180 tablet  3  . metoprolol (LOPRESSOR) 50 MG tablet TAKE ONE TABLET BY MOUTH TWICE DAILY  180 tablet  0  . Multiple Vitamin (MULTIVITAMIN) tablet Take 1 tablet by mouth daily.        . Omega-3 Fatty Acids (FISH OIL) 1200 MG CAPS Take 1 capsule by mouth daily.       .  pantoprazole (PROTONIX) 40 MG tablet TAKE ONE TABLET BY MOUTH EVERY DAY  90 tablet  1  . potassium gluconate 595 MG TABS Take 595 mg by mouth every other day.      . pravastatin (PRAVACHOL) 40 MG tablet TAKE ONE TABLET BY MOUTH EVERY DAY  90 tablet  1  . vitamin C (ASCORBIC ACID) 500 MG tablet Take 500 mg by mouth daily as needed (for cold symptoms).      . vitamin E 400 UNIT capsule Take 400 Units by mouth daily.      . benzonatate (TESSALON PERLES) 100 MG capsule 1-2 tabs by mouth every 6 hrs as needed for cough  60 capsule  1  . hydrocortisone (ANUSOL-HC) 25 MG suppository Place 1 suppository (25 mg total) rectally 2 (two) times daily as needed.  12 suppository  1   No current facility-administered medications on file prior to visit.   Review of Systems  Constitutional: Negative for unexpected weight change, or unusual diaphoresis  HENT: Negative for tinnitus.   Eyes: Negative for photophobia and visual disturbance.  Respiratory: Negative for choking and stridor.   Gastrointestinal: Negative for vomiting and blood in stool.  Genitourinary: Negative for hematuria and decreased urine volume.  Musculoskeletal: Negative for acute joint swelling Skin: Negative for color change and wound.  Neurological: Negative for tremors and numbness other than noted  Psychiatric/Behavioral: Negative for decreased concentration or  hyperactivity.       Objective:   Physical Exam BP 130/90  Pulse 78  Temp(Src) 97.6 F (36.4 C) (Oral)  Ht 5\' 2"  (1.575 m)  Wt 264 lb (119.75 kg)  BMI 48.27 kg/m2  SpO2 93% VS noted, mild ill Constitutional: Pt appears well-developed and well-nourished.  HENT: Head: NCAT.  Right Ear: External ear normal.  Left Ear: External ear normal.  Bilat tm's with mild erythema.  Max sinus areas non tender.  Pharynx with mild erythema, no exudate Eyes: Conjunctivae and EOM are normal. Pupils are equal, round, and reactive to light.  Neck: Normal range of motion. Neck supple.    Cardiovascular: Normal rate and regular rhythm.   Pulmonary/Chest: Effort normal and breath sounds mild decreased, no frank wheezing or rales  Neurological: Pt is alert. Not confused  Skin: Skin is warm. No erythema.  Psychiatric: Pt behavior is normal. Thought content normal.     Assessment & Plan:

## 2013-04-19 NOTE — Assessment & Plan Note (Signed)
stable overall by history and exam, recent data reviewed with pt, and pt to continue medical treatment as before,  to f/u any worsening symptoms or concerns SpO2 Readings from Last 3 Encounters:  04/19/13 93%  03/30/13 94%  03/01/13 97%

## 2013-05-10 ENCOUNTER — Encounter: Payer: Self-pay | Admitting: Internal Medicine

## 2013-05-16 ENCOUNTER — Telehealth: Payer: Self-pay

## 2013-05-16 NOTE — Telephone Encounter (Signed)
Patient requesting letter for excuse for jury duty due to caring for her father.  PCP did ok letter.  Letter is completed and called the patient to pickup at convenience.

## 2013-05-18 ENCOUNTER — Telehealth: Payer: Self-pay | Admitting: Internal Medicine

## 2013-05-18 NOTE — Telephone Encounter (Signed)
Relevant patient education mailed to patient.  

## 2013-07-06 ENCOUNTER — Other Ambulatory Visit: Payer: Self-pay | Admitting: Internal Medicine

## 2013-07-29 ENCOUNTER — Other Ambulatory Visit: Payer: Self-pay | Admitting: Internal Medicine

## 2013-08-31 ENCOUNTER — Ambulatory Visit (INDEPENDENT_AMBULATORY_CARE_PROVIDER_SITE_OTHER): Payer: Medicare Other | Admitting: Internal Medicine

## 2013-08-31 ENCOUNTER — Ambulatory Visit (INDEPENDENT_AMBULATORY_CARE_PROVIDER_SITE_OTHER)
Admission: RE | Admit: 2013-08-31 | Discharge: 2013-08-31 | Disposition: A | Discharge status: Home / Self Care | Payer: Medicare Other | Source: Ambulatory Visit | Attending: Internal Medicine | Admitting: Internal Medicine

## 2013-08-31 ENCOUNTER — Encounter: Payer: Self-pay | Admitting: Internal Medicine

## 2013-08-31 VITALS — BP 150/102 | HR 111 | Temp 98.5°F | Ht 62.0 in | Wt 246.0 lb

## 2013-08-31 DIAGNOSIS — J45909 Unspecified asthma, uncomplicated: Secondary | ICD-10-CM

## 2013-08-31 DIAGNOSIS — I10 Essential (primary) hypertension: Secondary | ICD-10-CM

## 2013-08-31 DIAGNOSIS — M79601 Pain in right arm: Secondary | ICD-10-CM | POA: Insufficient documentation

## 2013-08-31 DIAGNOSIS — J309 Allergic rhinitis, unspecified: Secondary | ICD-10-CM

## 2013-08-31 DIAGNOSIS — M79609 Pain in unspecified limb: Secondary | ICD-10-CM

## 2013-08-31 MED ORDER — PREDNISONE 10 MG PO TABS: ORAL_TABLET | ORAL | Status: DC

## 2013-08-31 MED ORDER — METHYLPREDNISOLONE ACETATE 80 MG/ML IJ SUSP
80.0000 mg | Freq: Once | INTRAMUSCULAR | Status: AC
  Administered 2013-08-31: 80 mg via INTRAMUSCULAR

## 2013-08-31 MED ORDER — IRBESARTAN 150 MG PO TABS: 150.0000 mg | ORAL_TABLET | Freq: Every day | ORAL | Status: DC

## 2013-08-31 NOTE — Assessment & Plan Note (Addendum)
With mild exac - cont current meds, but for depomedrol IM, predpac asd,  to f/u any worsening symptoms or concerns, pt requests cxr as well

## 2013-08-31 NOTE — Progress Notes (Signed)
Pre visit review using our clinic review tool, if applicable. No additional management support is needed unless otherwise documented below in the visit note. 

## 2013-08-31 NOTE — Assessment & Plan Note (Signed)
Uncontrolled, to add avapro 150 qd, f/u next visit

## 2013-08-31 NOTE — Assessment & Plan Note (Signed)
>   phlebitis vs other - for RUE venous doppler,  to f/u any worsening symptoms or concerns, cont asa

## 2013-08-31 NOTE — Addendum Note (Signed)
Addended by: Sharon Seller B on: 08/31/2013 03:11 PM   Modules accepted: Orders

## 2013-08-31 NOTE — Patient Instructions (Addendum)
Please take all new medication as prescribed - the generic avapro 150 mg per day, and the prednisone  OK to stop the amlodipine as you have  Please continue all other medications as before  Please have the pharmacy call with any other refills you may need.  You will be contacted regarding the referral for: RUE venous doppler (to see [facility] now)  Please go to the XRAY Department in the Basement (go straight as you get off the elevator) for the x-ray testing  You will be contacted by phone if any changes need to be made immediately.  Otherwise, you will receive a letter about your results with an explanation, but please check with MyChart first.  Please remember to sign up for MyChart if you have not done so, as this will be important to you in the future with finding out test results, communicating by private email, and scheduling acute appointments online when need

## 2013-08-31 NOTE — Progress Notes (Signed)
Subjective:    Patient ID: [name], female    DOB: [birth year], 63 y.o.   MRN: [id number]  HPI  Here with new onset 2 mo lump firm to right mid post arm, occas mild tender, no trauma or hx of phlebitis but some unsual swelling in the area as well. Pt denies chest pain,  wheezing, orthopnea, PND, increased LE swelling, palpitations, dizziness or syncope, but has had increased sob/doe mild in the past month assoc with increased nasal congestion/allerygy synmptoms, despite compliacne with her advair and meds.  BP has remained elev at home as well.  Stopped the amlod 5 mg after 1 wk due to palpiations that seemed to resolve with stopping, does not want to try again Past Medical History  Diagnosis Date  . Impaired glucose tolerance 09/21/2010  . ALLERGIC RHINITIS 08/16/2008  . ANXIETY 08/16/2008  . ASTHMA 08/16/2008  . COPD 08/16/2008  . DEPRESSION 08/16/2008  . GENITAL HERPES 12/03/2006  . HEMATOCHEZIA 06/20/2009  . HYPERLIPIDEMIA 08/16/2008  . HYPERTENSION 12/03/2006  . IBS 06/20/2009    hx per patient, no current problem  . Hemorrhoids   . HYPOTHYROIDISM 12/03/2006    History only - no current problem  . GERD (gastroesophageal reflux disease)   . H/O hiatal hernia   . Arthritis     hands, knees, lower back  . 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 Appendectomy    . Knee surgery      Right   . Tonsillectomy and adenoidectomy    . Abdominal hysterectomy    . Carpal tunnel release      Right   . Cesarean section      x 1   . Colonoscopy  2004  . Cystocele repair N/A 10/25/2012    Procedure: ANTERIOR REPAIR (CYSTOCELE);  Surgeon: Gus Height, MD;  Location: [facility];  Service: Gynecology;  Laterality: N/A;    reports that she quit smoking about 10 years ago. Her smoking use included Cigarettes. She has a 45 pack-year smoking history. She has never used smokeless tobacco. She reports that she does not drink alcohol or use  illicit drugs. family history includes Colon polyps in her mother; Diabetes in her mother; Hyperlipidemia in her mother; Hypertension in her mother; Stroke in her father. There is no history of Colon cancer. Allergies  Allergen Reactions  . Codeine Nausea And Vomiting  . Tramadol Hcl Nausea And Vomiting    REACTION: mouth dryness, headache  . Vicodin [Hydrocodone-Acetaminophen] Nausea And Vomiting   Current Outpatient Prescriptions on File Prior to Visit  Medication Sig Dispense Refill  . acetaminophen (TYLENOL) 650 MG CR tablet Take 1,300 mg by mouth 2 (two) times daily.      Marland Kitchen ADVAIR DISKUS 250-50 MCG/DOSE AEPB INHALE ONE PUFF BY MOUTH INTO THE LUNGS TWICE DAILY  180 each  6  . albuterol (VENTOLIN HFA) 108 (90 BASE) MCG/ACT inhaler Inhale 2 puffs into the lungs every 6 (six) hours as needed.  3 Inhaler  3  . aspirin 81 MG EC tablet Take 81 mg by mouth daily.        Marland Kitchen b complex vitamins tablet Take 1 tablet by mouth daily.      . Bepotastine Besilate (BEPREVE) 1.5 % SOLN Place 1 drop into both eyes daily as needed (for allergies).      . cetirizine (ZYRTEC) 10 MG tablet Take 10 mg by mouth  daily.       . cholecalciferol (VITAMIN D) 1000 UNITS tablet Take 1,000 Units by mouth daily.      Marland Kitchen escitalopram (LEXAPRO) 10 MG tablet TAKE ONE TABLET BY MOUTH EVERY DAY  90 tablet  3  . estradiol (ESTRACE) 2 MG tablet TAKE ONE TABLET BY MOUTH EVERY DAY  90 tablet  3  . furosemide (LASIX) 40 MG tablet TAKE ONE TABLET BY MOUTH EVERY DAY  90 tablet  3  . Garlic 4481 MG CAPS Take 1 capsule by mouth at bedtime.       . hydrocortisone (ANUSOL-HC) 25 MG suppository Place 1 suppository (25 mg total) rectally 2 (two) times daily as needed.  12 suppository  1  . LORazepam (ATIVAN) 1 MG tablet Take 1 tablet (1 mg total) by mouth at bedtime as needed for anxiety.  30 tablet  5  . metoprolol (LOPRESSOR) 50 MG tablet TAKE ONE TABLET BY MOUTH TWICE DAILY  180 tablet  3  . Multiple Vitamin (MULTIVITAMIN) tablet Take  1 tablet by mouth daily.        . Omega-3 Fatty Acids (FISH OIL) 1200 MG CAPS Take 1 capsule by mouth daily.       . pantoprazole (PROTONIX) 40 MG tablet TAKE ONE TABLET BY MOUTH EVERY DAY  90 tablet  1  . potassium gluconate 595 MG TABS Take 595 mg by mouth every other day.      . pravastatin (PRAVACHOL) 40 MG tablet TAKE ONE TABLET BY MOUTH ONCE DAILY  90 tablet  3  . vitamin C (ASCORBIC ACID) 500 MG tablet Take 500 mg by mouth daily as needed (for cold symptoms).      . vitamin E 400 UNIT capsule Take 400 Units by mouth daily.       No current facility-administered medications on file prior to visit.    Review of Systems  Constitutional: Negative for unusual diaphoresis or other sweats  HENT: Negative for ringing in ear Eyes: Negative for double vision or worsening visual disturbance.  Respiratory: Negative for choking and stridor.   Gastrointestinal: Negative for vomiting or other signifcant bowel change Genitourinary: Negative for hematuria or decreased urine volume.  Musculoskeletal: Negative for other MSK pain or swelling Skin: Negative for color change and worsening wound.  Neurological: Negative for tremors and numbness other than noted  Psychiatric/Behavioral: Negative for decreased concentration or agitation other than above       Objective:   Physical Exam BP 150/102  Pulse 111  Temp(Src) 98.5 F (36.9 C) (Oral)  Ht 5\' 2"  (1.575 m)  Wt 246 lb (111.585 kg)  BMI 44.98 kg/m2  SpO2 94% VS noted, not ill appearing Constitutional: Pt appears well-developed, well-nourished.  HENT: Head: NCAT.  Right Ear: External ear normal.  Left Ear: External ear normal.  Eyes: . Pupils are equal, round, and reactive to light. Conjunctivae and EOM are normal Neck: Normal range of motion. Neck supple.  Cardiovascular: Normal rate and regular rhythm.   Pulmonary/Chest: Effort normal and breath sounds decrease, few bilat wheeze.  Abd:  Soft, NT, ND, + BS Neurological: Pt is alert. Not  confused , motor grossly intact Skin: Skin is warm. No rash, but right mid arm post with 2x2 cm area soft tissue swelling assoc with firmness that seems to correllate with superficial arm vein as well Psychiatric: Pt behavior is normal. No agitation.     Assessment & Plan:   BP Readings from Last 3 Encounters:  08/31/13 150/102  04/19/13  130/90  03/30/13 160/102    

## 2013-09-07 ENCOUNTER — Encounter: Payer: Self-pay | Admitting: Internal Medicine

## 2013-09-07 ENCOUNTER — Ambulatory Visit (HOSPITAL_COMMUNITY): Payer: Medicare Other | Attending: Cardiovascular Disease | Admitting: *Deleted

## 2013-09-07 DIAGNOSIS — M7989 Other specified soft tissue disorders: Secondary | ICD-10-CM

## 2013-09-07 DIAGNOSIS — M79609 Pain in unspecified limb: Secondary | ICD-10-CM

## 2013-09-07 DIAGNOSIS — R229 Localized swelling, mass and lump, unspecified: Secondary | ICD-10-CM

## 2013-09-07 DIAGNOSIS — M79601 Pain in right arm: Secondary | ICD-10-CM

## 2013-09-07 NOTE — Progress Notes (Signed)
Venous duplex right upper extremity complete.

## 2013-09-16 ENCOUNTER — Other Ambulatory Visit: Payer: Self-pay | Admitting: Internal Medicine

## 2013-09-27 ENCOUNTER — Telehealth: Payer: Self-pay | Admitting: Internal Medicine

## 2013-09-27 NOTE — Telephone Encounter (Signed)
Negative for blood clot, no further evaluation for blood clots needed

## 2013-09-27 NOTE — Telephone Encounter (Signed)
Patient calling for test results for test done on 09-07-2013.

## 2013-09-27 NOTE — Telephone Encounter (Signed)
Called the patient and she is requesting result of venous duplex right arm.

## 2013-09-28 NOTE — Telephone Encounter (Signed)
Patient informed of results.  

## 2013-10-03 ENCOUNTER — Encounter: Payer: Self-pay | Admitting: Internal Medicine

## 2013-10-03 ENCOUNTER — Ambulatory Visit (INDEPENDENT_AMBULATORY_CARE_PROVIDER_SITE_OTHER): Payer: Medicare Other | Admitting: Internal Medicine

## 2013-10-03 ENCOUNTER — Other Ambulatory Visit (INDEPENDENT_AMBULATORY_CARE_PROVIDER_SITE_OTHER): Payer: Medicare Other

## 2013-10-03 VITALS — BP 150/100 | HR 100 | Temp 97.7°F | Wt 244.0 lb

## 2013-10-03 DIAGNOSIS — J449 Chronic obstructive pulmonary disease, unspecified: Secondary | ICD-10-CM

## 2013-10-03 DIAGNOSIS — R7302 Impaired glucose tolerance (oral): Secondary | ICD-10-CM

## 2013-10-03 DIAGNOSIS — Z0000 Encounter for general adult medical examination without abnormal findings: Secondary | ICD-10-CM

## 2013-10-03 DIAGNOSIS — E039 Hypothyroidism, unspecified: Secondary | ICD-10-CM

## 2013-10-03 DIAGNOSIS — R7309 Other abnormal glucose: Secondary | ICD-10-CM

## 2013-10-03 LAB — LIPID PANEL
CHOL/HDL RATIO: 3
Cholesterol: 152 mg/dL (ref 0–200)
HDL: 45 mg/dL (ref >39.00)
LDL CALC: 71 mg/dL (ref 0–99)
NONHDL: 107
Triglycerides: 180 mg/dL — ABNORMAL HIGH (ref 0.0–149.0)
VLDL: 36 mg/dL (ref 0.0–40.0)

## 2013-10-03 LAB — BASIC METABOLIC PANEL
BUN: 14 mg/dL (ref 6–23)
CALCIUM: 8.7 mg/dL (ref 8.4–10.5)
CO2: 28 mEq/L (ref 19–32)
CREATININE: 0.7 mg/dL (ref 0.4–1.2)
Chloride: 100 mEq/L (ref 96–112)
GFR: 95.19 mL/min (ref >60.00)
Glucose, Bld: 131 mg/dL — ABNORMAL HIGH (ref 70–99)
Potassium: 4.4 mEq/L (ref 3.5–5.1)
Sodium: 136 mEq/L (ref 135–145)

## 2013-10-03 LAB — URINALYSIS, ROUTINE W REFLEX MICROSCOPIC
BILIRUBIN URINE: NEGATIVE
HGB URINE DIPSTICK: NEGATIVE
Ketones, ur: NEGATIVE
Leukocytes, UA: NEGATIVE
Nitrite: NEGATIVE
RBC / HPF: NONE SEEN (ref 0–?)
Specific Gravity, Urine: 1.025 (ref 1.000–1.030)
TOTAL PROTEIN, URINE-UPE24: 100 — AB
Urine Glucose: NEGATIVE
Urobilinogen, UA: 0.2 (ref 0.0–1.0)
pH: 6 (ref 5.0–8.0)

## 2013-10-03 LAB — CBC WITH DIFFERENTIAL/PLATELET
BASOS PCT: 0.4 % (ref 0.0–3.0)
Basophils Absolute: 0 10*3/uL (ref 0.0–0.1)
EOS ABS: 0.2 10*3/uL (ref 0.0–0.7)
Eosinophils Relative: 2 % (ref 0.0–5.0)
HCT: 40.9 % (ref 36.0–46.0)
HEMOGLOBIN: 13.5 g/dL (ref 12.0–15.0)
Lymphocytes Relative: 36.3 % (ref 12.0–46.0)
Lymphs Abs: 3.4 10*3/uL (ref 0.7–4.0)
MCHC: 33 g/dL (ref 30.0–36.0)
MCV: 91.5 fl (ref 78.0–100.0)
MONO ABS: 0.4 10*3/uL (ref 0.1–1.0)
Monocytes Relative: 4.6 % (ref 3.0–12.0)
NEUTROS ABS: 5.3 10*3/uL (ref 1.4–7.7)
Neutrophils Relative %: 56.7 % (ref 43.0–77.0)
Platelets: 254 10*3/uL (ref 150.0–400.0)
RBC: 4.47 Mil/uL (ref 3.87–5.11)
RDW: 13.9 % (ref 11.5–15.5)
WBC: 9.4 10*3/uL (ref 4.0–10.5)

## 2013-10-03 LAB — TSH: TSH: 2.72 u[IU]/mL (ref 0.35–4.50)

## 2013-10-03 LAB — HEPATIC FUNCTION PANEL
ALT: 20 U/L (ref 0–35)
AST: 23 U/L (ref 0–37)
Albumin: 3.8 g/dL (ref 3.5–5.2)
Alkaline Phosphatase: 53 U/L (ref 39–117)
BILIRUBIN DIRECT: 0.1 mg/dL (ref 0.0–0.3)
Total Bilirubin: 0.7 mg/dL (ref 0.2–1.2)
Total Protein: 6.6 g/dL (ref 6.0–8.3)

## 2013-10-03 LAB — HEMOGLOBIN A1C: HEMOGLOBIN A1C: 6.7 % — AB (ref 4.6–6.5)

## 2013-10-03 NOTE — Assessment & Plan Note (Signed)
Asymtp, for a1c 

## 2013-10-03 NOTE — Progress Notes (Signed)
Pre visit review using our clinic review tool, if applicable. No additional management support is needed unless otherwise documented below in the visit note. 

## 2013-10-03 NOTE — Assessment & Plan Note (Signed)
Overall doing well, age appropriate education and counseling updated, referrals for preventative services and immunizations addressed, dietary and smoking counseling addressed, most recent labs reviewed.  I have personally reviewed and have noted: 1) the patient's medical and social history 2) The pt's use of alcohol, tobacco, and illicit drugs 3) The patient's current medications and supplements 4) Functional ability including ADL's, fall risk, home safety risk, hearing and visual impairment 5) Diet and physical activities 6) Evidence for depression or mood disorder 7) The patient's height, weight, and BMI have been recorded in the chart I have made referrals, and provided counseling and education based on review of the above For labs, shingles shot such as at [facility] (less expensive than here), and handicap permit

## 2013-10-03 NOTE — Assessment & Plan Note (Addendum)
stable overall by history and exam, recent data reviewed with pt, and pt to continue medical treatment as before,  to f/u any worsening symptoms or concerns, ok for handicap permit given LBP as well  SpO2 Readings from Last 3 Encounters:  10/03/13 97%  08/31/13 94%  04/19/13 93%

## 2013-10-03 NOTE — Patient Instructions (Signed)
Please continue all other medications as before, and refills have been done if requested.  Please have the pharmacy call with any other refills you may need.  Please continue your efforts at being more active, low cholesterol diet, and weight control.  You are otherwise up to date with prevention measures today.  Please keep your appointments with your specialists as you may have planned  You are given the shingles shot prescription, as well as the handicap permit application  Please go to the LAB in the Basement (turn left off the elevator) for the tests to be done today  You will be contacted by phone if any changes need to be made immediately.  Otherwise, you will receive a letter about your results with an explanation, but please check with MyChart first.  Please remember to sign up for MyChart if you have not done so, as this will be important to you in the future with finding out test results, communicating by private email, and scheduling acute appointments online when needed.  Please return in 6 months, or sooner if needed

## 2013-10-03 NOTE — Progress Notes (Signed)
Subjective:    Patient ID: Andrea Perry, female    DOB: 12-Feb-1951, 63 y.o.   MRN: [id number]  HPI  Here for wellness and f/u;  Overall doing ok;  Pt denies CP, worsening SOB, DOE, wheezing, orthopnea, PND, worsening LE edema, palpitations, dizziness or syncope.  Pt denies neurological change such as new headache, facial or extremity weakness.  Pt denies polydipsia, polyuria, or low sugar symptoms. Pt states overall good compliance with treatment and medications, good tolerability, and has been trying to follow lower cholesterol diet.  Pt denies worsening depressive symptoms, suicidal ideation or panic. No fever, night sweats, wt loss, loss of appetite, or other constitutional symptoms.  Pt states good ability with ADL's, has low fall risk, home safety reviewed and adequate, no other significant changes in hearing or vision, and only occasionally active with exercise.  BP at [facility] several times has been 120-130 sbp. Asks for shingles shot, but not sure about ins coverage.  Ask for handicap permit. Pt continues to have recurring LBP without change in severity, bowel or bladder change, fever, wt loss,  worsening LE pain/numbness/weakness, gait change or falls.  Past Medical History  Diagnosis Date  . Impaired glucose tolerance 09/21/2010  . ALLERGIC RHINITIS 08/16/2008  . ANXIETY 08/16/2008  . ASTHMA 08/16/2008  . COPD 08/16/2008  . DEPRESSION 08/16/2008  . GENITAL HERPES 12/03/2006  . HEMATOCHEZIA 06/20/2009  . HYPERLIPIDEMIA 08/16/2008  . HYPERTENSION 12/03/2006  . IBS 06/20/2009    hx per patient, no current problem  . Hemorrhoids   . HYPOTHYROIDISM 12/03/2006    History only - no current problem  . GERD (gastroesophageal reflux disease)   . H/O hiatal hernia   . Arthritis     hands, knees, lower back  . 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 Appendectomy    . Knee surgery      Right   . Tonsillectomy and adenoidectomy    .  Abdominal hysterectomy    . Carpal tunnel release      Right   . Cesarean section      x 1   . Colonoscopy  2004  . Cystocele repair N/A 10/25/2012    Procedure: ANTERIOR REPAIR (CYSTOCELE);  Surgeon: Gus Height, MD;  Location: [facility];  Service: Gynecology;  Laterality: N/A;    reports that she quit smoking about 10 years ago. Her smoking use included Cigarettes. She has a 45 pack-year smoking history. She has never used smokeless tobacco. She reports that she does not drink alcohol or use illicit drugs. family history includes Colon polyps in her mother; Diabetes in her mother; Hyperlipidemia in her mother; Hypertension in her mother; Stroke in her father. There is no history of Colon cancer. Allergies  Allergen Reactions  . Codeine Nausea And Vomiting  . Tramadol Hcl Nausea And Vomiting    REACTION: mouth dryness, headache  . Vicodin [Hydrocodone-Acetaminophen] Nausea And Vomiting   Current Outpatient Prescriptions on File Prior to Visit  Medication Sig Dispense Refill  . acetaminophen (TYLENOL) 650 MG CR tablet Take 1,300 mg by mouth 2 (two) times daily.      Marland Kitchen ADVAIR DISKUS 250-50 MCG/DOSE AEPB INHALE ONE PUFF BY MOUTH INTO THE LUNGS TWICE DAILY  180 each  6  . albuterol (VENTOLIN HFA) 108 (90 BASE) MCG/ACT inhaler Inhale 2 puffs into the lungs every 6 (six) hours as needed.  3 Inhaler  3  .  aspirin 81 MG EC tablet Take 81 mg by mouth daily.        Marland Kitchen b complex vitamins tablet Take 1 tablet by mouth daily.      . Bepotastine Besilate (BEPREVE) 1.5 % SOLN Place 1 drop into both eyes daily as needed (for allergies).      . cetirizine (ZYRTEC) 10 MG tablet Take 10 mg by mouth daily.       . cholecalciferol (VITAMIN D) 1000 UNITS tablet Take 1,000 Units by mouth daily.      Marland Kitchen escitalopram (LEXAPRO) 10 MG tablet TAKE ONE TABLET BY MOUTH EVERY DAY  90 tablet  3  . estradiol (ESTRACE) 2 MG tablet TAKE ONE TABLET BY MOUTH EVERY DAY  90 tablet  3  . furosemide (LASIX) 40 MG tablet TAKE ONE  TABLET BY MOUTH EVERY DAY  90 tablet  3  . Garlic 1245 MG CAPS Take 1 capsule by mouth at bedtime.       . irbesartan (AVAPRO) 150 MG tablet Take 1 tablet (150 mg total) by mouth daily.  90 tablet  3  . LORazepam (ATIVAN) 1 MG tablet Take 1 tablet (1 mg total) by mouth at bedtime as needed for anxiety.  30 tablet  5  . metoprolol (LOPRESSOR) 50 MG tablet TAKE ONE TABLET BY MOUTH TWICE DAILY  180 tablet  3  . Multiple Vitamin (MULTIVITAMIN) tablet Take 1 tablet by mouth daily.        . Omega-3 Fatty Acids (FISH OIL) 1200 MG CAPS Take 1 capsule by mouth daily.       . pantoprazole (PROTONIX) 40 MG tablet TAKE ONE TABLET BY MOUTH ONCE DAILY  90 tablet  0  . potassium gluconate 595 MG TABS Take 595 mg by mouth every other day.      . pravastatin (PRAVACHOL) 40 MG tablet TAKE ONE TABLET BY MOUTH ONCE DAILY  90 tablet  3  . vitamin C (ASCORBIC ACID) 500 MG tablet Take 500 mg by mouth daily as needed (for cold symptoms).      . vitamin E 400 UNIT capsule Take 400 Units by mouth daily.       No current facility-administered medications on file prior to visit.   Review of Systems Constitutional: Negative for increased diaphoresis, other activity, appetite or other siginficant weight change  HENT: Negative for worsening hearing loss, ear pain, facial swelling, mouth sores and neck stiffness.   Eyes: Negative for other worsening pain, redness or visual disturbance.  Respiratory: Negative for shortness of breath and wheezing.   Cardiovascular: Negative for chest pain and palpitations.  Gastrointestinal: Negative for diarrhea, blood in stool, abdominal distention or other pain Genitourinary: Negative for hematuria, flank pain or change in urine volume.  Musculoskeletal: Negative for myalgias or other joint complaints.  Skin: Negative for color change and wound.  Neurological: Negative for syncope and numbness. other than noted Hematological: Negative for adenopathy. or other  swelling Psychiatric/Behavioral: Negative for hallucinations, self-injury, decreased concentration or other worsening agitation.      Objective:   Physical Exam BP 150/100  Pulse 100  Temp(Src) 97.7 F (36.5 C) (Oral)  Wt 244 lb (110.678 kg)  SpO2 97% VS noted,  Constitutional: Pt is oriented to person, place, and time. Appears well-developed and well-nourished. Andrea Perry Head: Normocephalic and atraumatic.  Right Ear: External ear normal.  Left Ear: External ear normal.  Nose: Nose normal.  Mouth/Throat: Oropharynx is clear and moist.  Eyes: Conjunctivae and EOM are normal. Pupils are  equal, round, and reactive to light.  Neck: Normal range of motion. Neck supple. No JVD present. No tracheal deviation present.  Cardiovascular: Normal rate, regular rhythm, normal heart sounds and intact distal pulses.   Pulmonary/Chest: Effort normal and breath sounds without rales or wheezing  Abdominal: Soft. Bowel sounds are normal. NT. No HSM  Musculoskeletal: Normal range of motion. Exhibits no edema.  Lymphadenopathy:  Has no cervical adenopathy.  Neurological: Pt is alert and oriented to person, place, and time. Pt has normal reflexes. No cranial nerve deficit. Motor grossly intact Skin: Skin is warm and dry. No rash noted.  Psychiatric:  Has normal mood and affect. Behavior is normal.     Assessment & Plan:

## 2013-10-04 ENCOUNTER — Encounter: Payer: Self-pay | Admitting: Internal Medicine

## 2013-10-18 ENCOUNTER — Encounter: Payer: Self-pay | Admitting: Sports Medicine

## 2013-10-18 ENCOUNTER — Ambulatory Visit (INDEPENDENT_AMBULATORY_CARE_PROVIDER_SITE_OTHER): Payer: Medicare Other | Admitting: Sports Medicine

## 2013-10-18 VITALS — BP 159/116 | Ht 62.0 in | Wt 250.0 lb

## 2013-10-18 DIAGNOSIS — M1710 Unilateral primary osteoarthritis, unspecified knee: Secondary | ICD-10-CM

## 2013-10-18 DIAGNOSIS — M48061 Spinal stenosis, lumbar region without neurogenic claudication: Secondary | ICD-10-CM

## 2013-10-18 DIAGNOSIS — M1712 Unilateral primary osteoarthritis, left knee: Secondary | ICD-10-CM

## 2013-10-18 MED ORDER — METHYLPREDNISOLONE ACETATE 40 MG/ML IJ SUSP
40.0000 mg | Freq: Once | INTRAMUSCULAR | Status: AC
  Administered 2013-10-18: 40 mg via INTRA_ARTICULAR

## 2013-10-18 MED ORDER — GABAPENTIN 300 MG PO CAPS: ORAL_CAPSULE | ORAL | Status: DC

## 2013-10-20 NOTE — Progress Notes (Signed)
   Subjective:    Patient ID: Andrea Perry, female    DOB: [birth year], 63 y.o.   MRN: [id number]  HPI Patient comes in today with a couple of different complaints. Main complaint is returning left knee pain. She has a well-documented history of osteoarthritis in his knee. X-rays done in February 2014 showed moderately advanced medial compartmental DJD. She has done well with cortisone injections in the past. She would like a repeat injection today. Pain is identical to what he's experienced previously. No recent trauma. She's getting some stiffness with prolonged rest and pain with activity. She is also complaining of some increased "tingling" in her feet. She has a documented history of spinal stenosis. The discomfort in her feet is present both with ambulation and at rest. No weakness.    Review of Systems     Objective:   Physical Exam Obese, no acute distress. Awake alert and oriented x3. Vital signs are reviewed  Left knee: Range of motion is 0-100. Trace to 1+ effusion. Tender to palpation along the medial joint line but a negative Murray. Knee is grossly stable to ligamentous exam.  Neurological exam: Negative straight leg raise bilaterally. Strength, sensation, and reflexes are present and equal in both lower extremities. No muscle atrophy.  2+ pitting edema both lower extremities up to the knees Walking with a slightly antalgic gait       Assessment & Plan:  1. Left knee pain secondary to medial compartmental DJD 2. Spinal stenosis  Left knee is injected with cortisone today. An anterior lateral approach was utilized. Patient tolerated this without difficulty. Patient's history of foot discomfort is consistent with neuropathy but I'm not completely sure that it is coming from her spinal stenosis. Nonetheless, she has done well with Neurontin in the past. I've given her a refill with instructions to take 300 mg each bedtime for 7 days and then increase the dose to 600 mg each  bedtime thereafter. She will let me know if she has persistent discomfort either in her left knee or in her legs. Followup as needed.  Consent obtained and verified. Time-out conducted. Noted no overlying erythema, induration, or other signs of local infection. Skin prepped in a sterile fashion. Topical analgesic spray: Ethyl chloride. Joint: left knee Needle: 22g 1.5 inch Completed without difficulty. Meds: 3cc 1% xylocaine, 1cc (40mg ) depomedrol  Advised to call if fevers/chills, erythema, induration, drainage, or persistent bleeding.

## 2013-10-27 ENCOUNTER — Other Ambulatory Visit: Payer: Self-pay | Admitting: Internal Medicine

## 2013-10-31 ENCOUNTER — Other Ambulatory Visit: Payer: Self-pay

## 2013-10-31 MED ORDER — METOPROLOL TARTRATE 50 MG PO TABS: ORAL_TABLET | ORAL | Status: DC

## 2013-10-31 MED ORDER — LORAZEPAM 1 MG PO TABS: 1.0000 mg | ORAL_TABLET | Freq: Every evening | ORAL | Status: DC | PRN

## 2013-10-31 NOTE — Telephone Encounter (Signed)
Done hardcopy to robin  

## 2013-10-31 NOTE — Telephone Encounter (Signed)
Faxed hardcopy to [facility]

## 2013-11-17 ENCOUNTER — Telehealth: Payer: Self-pay | Admitting: Internal Medicine

## 2013-11-17 NOTE — Telephone Encounter (Signed)
Patient is requesting increase on advair.  Please advise.

## 2013-11-20 NOTE — Telephone Encounter (Signed)
Please clarify symptoms pt wishes to improve  The increased advair might be necessary if she has ongoing sob/doe on a regular basis, say for at least 2-3 wks or longer.  If only having symptoms for a few days or a week, we should more likely see back in the office, as she may have a more temporary problem that might not need increased dosing of advair on a regular basis   thanks

## 2013-11-21 ENCOUNTER — Encounter: Payer: Self-pay | Admitting: Internal Medicine

## 2013-11-21 ENCOUNTER — Ambulatory Visit (INDEPENDENT_AMBULATORY_CARE_PROVIDER_SITE_OTHER): Payer: Medicare Other | Admitting: Internal Medicine

## 2013-11-21 VITALS — BP 156/99 | HR 98 | Temp 98.3°F | Wt 249.2 lb

## 2013-11-21 DIAGNOSIS — R7302 Impaired glucose tolerance (oral): Secondary | ICD-10-CM

## 2013-11-21 DIAGNOSIS — R7309 Other abnormal glucose: Secondary | ICD-10-CM

## 2013-11-21 DIAGNOSIS — J441 Chronic obstructive pulmonary disease with (acute) exacerbation: Secondary | ICD-10-CM

## 2013-11-21 DIAGNOSIS — I10 Essential (primary) hypertension: Secondary | ICD-10-CM

## 2013-11-21 MED ORDER — FLUTICASONE-SALMETEROL 500-50 MCG/DOSE IN AEPB: 1.0000 | INHALATION_SPRAY | Freq: Two times a day (BID) | RESPIRATORY_TRACT | Status: DC

## 2013-11-21 MED ORDER — METHYLPREDNISOLONE ACETATE 80 MG/ML IJ SUSP
120.0000 mg | Freq: Once | INTRAMUSCULAR | Status: AC
  Administered 2013-11-21: 120 mg via INTRAMUSCULAR

## 2013-11-21 MED ORDER — PREDNISONE 10 MG PO TABS: ORAL_TABLET | ORAL | Status: DC

## 2013-11-21 NOTE — Telephone Encounter (Signed)
This sounds fairly severe, and should be seen in office at 3pm or 630 today if still appt available

## 2013-11-21 NOTE — Progress Notes (Signed)
Subjective:    Patient ID: [name], female    DOB: [birth year], 63 y.o.   MRN: [id number]  HPI Here to f/u, c/o  4 days onset worsening nonprod cough, wheezing, sob/doe now with trouble speaking full sentences, no fever , sputum clearish, Pt denies chest pain, orthopnea, PND, increased LE swelling, palpitations, dizziness or syncope.  Pt denies fever, wt loss, night sweats, loss of appetite, or other constitutional symptoms   Pt denies polydipsia, polyuria,  Past Medical History  Diagnosis Date  . Impaired glucose tolerance 09/21/2010  . ALLERGIC RHINITIS 08/16/2008  . ANXIETY 08/16/2008  . ASTHMA 08/16/2008  . COPD 08/16/2008  . DEPRESSION 08/16/2008  . GENITAL HERPES 12/03/2006  . HEMATOCHEZIA 06/20/2009  . HYPERLIPIDEMIA 08/16/2008  . HYPERTENSION 12/03/2006  . IBS 06/20/2009    hx per patient, no current problem  . Hemorrhoids   . HYPOTHYROIDISM 12/03/2006    History only - no current problem  . GERD (gastroesophageal reflux disease)   . H/O hiatal hernia   . Arthritis     hands, knees, lower back  . 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 Appendectomy    . Knee surgery      Right   . Tonsillectomy and adenoidectomy    . Abdominal hysterectomy    . Carpal tunnel release      Right   . Cesarean section      x 1   . Colonoscopy  2004  . Cystocele repair N/A 10/25/2012    Procedure: ANTERIOR REPAIR (CYSTOCELE);  Surgeon: Gus Height, MD;  Location: [facility];  Service: Gynecology;  Laterality: N/A;    reports that she quit smoking about 10 years ago. Her smoking use included Cigarettes. She has a 45 pack-year smoking history. She has never used smokeless tobacco. She reports that she does not drink alcohol or use illicit drugs. family history includes Colon polyps in her mother; Diabetes in her mother; Hyperlipidemia in her mother; Hypertension in her mother; Stroke in her father. There is no history of Colon  cancer. Allergies  Allergen Reactions  . Codeine Nausea And Vomiting  . Tramadol Hcl Nausea And Vomiting    REACTION: mouth dryness, headache  . Vicodin [Hydrocodone-Acetaminophen] Nausea And Vomiting   Current Outpatient Prescriptions on File Prior to Visit  Medication Sig Dispense Refill  . acetaminophen (TYLENOL) 650 MG CR tablet Take 1,300 mg by mouth 2 (two) times daily.      Marland Kitchen albuterol (VENTOLIN HFA) 108 (90 BASE) MCG/ACT inhaler Inhale 2 puffs into the lungs every 6 (six) hours as needed.  3 Inhaler  3  . aspirin 81 MG EC tablet Take 81 mg by mouth daily.        Marland Kitchen b complex vitamins tablet Take 1 tablet by mouth daily.      . Bepotastine Besilate (BEPREVE) 1.5 % SOLN Place 1 drop into both eyes daily as needed (for allergies).      . cetirizine (ZYRTEC) 10 MG tablet Take 10 mg by mouth daily.       . cholecalciferol (VITAMIN D) 1000 UNITS tablet Take 1,000 Units by mouth daily.      Marland Kitchen escitalopram (LEXAPRO) 10 MG tablet TAKE ONE TABLET BY MOUTH EVERY DAY  90 tablet  3  . estradiol (ESTRACE) 2 MG tablet TAKE ONE TABLET BY MOUTH EVERY DAY  90 tablet  3  . furosemide (LASIX)  40 MG tablet TAKE ONE TABLET BY MOUTH EVERY DAY  90 tablet  3  . gabapentin (NEURONTIN) 300 MG capsule Take one qhs for 7 days then 2 qhs  90 capsule  3  . Garlic 4709 MG CAPS Take 1 capsule by mouth at bedtime.       . irbesartan (AVAPRO) 150 MG tablet Take 1 tablet (150 mg total) by mouth daily.  90 tablet  3  . LORazepam (ATIVAN) 1 MG tablet Take 1 tablet (1 mg total) by mouth at bedtime as needed for anxiety.  30 tablet  5  . meloxicam (MOBIC) 15 MG tablet       . metoprolol (LOPRESSOR) 50 MG tablet TAKE ONE TABLET BY MOUTH TWICE DAILY  180 tablet  3  . Multiple Vitamin (MULTIVITAMIN) tablet Take 1 tablet by mouth daily.        . Omega-3 Fatty Acids (FISH OIL) 1200 MG CAPS Take 1 capsule by mouth daily.       . pantoprazole (PROTONIX) 40 MG tablet TAKE ONE TABLET BY MOUTH ONCE DAILY  90 tablet  0  .  potassium gluconate 595 MG TABS Take 595 mg by mouth every other day.      . pravastatin (PRAVACHOL) 40 MG tablet TAKE ONE TABLET BY MOUTH ONCE DAILY  90 tablet  3  . predniSONE (DELTASONE) 10 MG tablet       . vitamin C (ASCORBIC ACID) 500 MG tablet Take 500 mg by mouth daily as needed (for cold symptoms).      . vitamin E 400 UNIT capsule Take 400 Units by mouth daily.       No current facility-administered medications on file prior to visit.   Review of Systems  Constitutional: Negative for unusual diaphoresis or other sweats  HENT: Negative for ringing in ear Eyes: Negative for double vision or worsening visual disturbance.  Respiratory: Negative for choking and stridor.   Gastrointestinal: Negative for vomiting or other signifcant bowel change Genitourinary: Negative for hematuria or decreased urine volume.  Musculoskeletal: Negative for other MSK pain or swelling Skin: Negative for color change and worsening wound.  Neurological: Negative for tremors and numbness other than noted  Psychiatric/Behavioral: Negative for decreased concentration or agitation other than above       Objective:   Physical Exam BP 156/99  Pulse 98  Temp(Src) 98.3 F (36.8 C) (Oral)  Wt 249 lb 4 oz (113.059 kg)  SpO2 98% VS noted, non toxic appearing Constitutional: Pt appears well-developed, well-nourished.  HENT: Head: NCAT.  Right Ear: External ear normal.  Left Ear: External ear normal.  Bilat tm's with mild erythema.  Max sinus areas non tender.  Pharynx with mild erythema, no exudate Eyes: . Pupils are equal, round, and reactive to light. Conjunctivae and EOM are normal Neck: Normal range of motion. Neck supple.  Cardiovascular: Normal rate and regular rhythm.   Pulmonary/Chest: Effort normal and breath sounds decreased bilat with bilat wheezing.  Neurological: Pt is alert. Not confused , motor grossly intact Skin: Skin is warm. No rash Psychiatric: Pt behavior is normal. No agitation. but  mild nervous    Assessment & Plan:

## 2013-11-21 NOTE — Telephone Encounter (Signed)
Patient informed and will come in today at 3.

## 2013-11-21 NOTE — Telephone Encounter (Signed)
Pt states that she has been dealing with sob for about 3-4 weeks.  Pt states that her sx are worse while walking, and states that she has to stop walking in order to catch her breath.  Please advise

## 2013-11-21 NOTE — Patient Instructions (Signed)
You had the nebulizer breathing treatment, and You had the steroid shot today  Please take all new medication as prescribed - the prednisone  OK to stop the Advair 250/50; and take the 500/50 twice per day instead  Please continue all other medications as before, and refills have been done if requested.  Please have the pharmacy call with any other refills you may need.  Please keep your appointments with your specialists as you may have planned

## 2013-11-21 NOTE — Progress Notes (Signed)
Pre visit review using our clinic review tool, if applicable. No additional management support is needed unless otherwise documented below in the visit note. 

## 2013-11-26 NOTE — Assessment & Plan Note (Signed)
Mild to mod, for depomedrol IM, predpac asd, increased advair to 500/50 bid,  to f/u any worsening symptoms or concerns

## 2013-11-26 NOTE — Assessment & Plan Note (Signed)
Mild elev persistnet, but stable overall by history and exam, recent data reviewed with pt, and pt to continue medical treatment as before - declines any change today,  to f/u any worsening symptoms or concerns BP Readings from Last 3 Encounters:  11/21/13 156/99  10/18/13 159/116  10/03/13 150/100

## 2013-11-26 NOTE — Assessment & Plan Note (Signed)
Asympt, for a1c, stable overall by history and exam, recent data reviewed with pt, and pt to continue medical treatment as before,  to f/u any worsening symptoms or concerns Lab Results  Component Value Date   HGBA1C 6.7* 10/03/2013

## 2013-11-28 ENCOUNTER — Ambulatory Visit (INDEPENDENT_AMBULATORY_CARE_PROVIDER_SITE_OTHER): Payer: Medicare Other | Admitting: Sports Medicine

## 2013-11-28 ENCOUNTER — Encounter: Payer: Self-pay | Admitting: Sports Medicine

## 2013-11-28 VITALS — BP 171/129 | Ht 62.0 in | Wt 240.0 lb

## 2013-11-28 DIAGNOSIS — M1710 Unilateral primary osteoarthritis, unspecified knee: Secondary | ICD-10-CM

## 2013-11-28 DIAGNOSIS — M25562 Pain in left knee: Secondary | ICD-10-CM

## 2013-11-28 DIAGNOSIS — M1712 Unilateral primary osteoarthritis, left knee: Secondary | ICD-10-CM

## 2013-11-28 DIAGNOSIS — M25569 Pain in unspecified knee: Secondary | ICD-10-CM

## 2013-11-28 NOTE — Progress Notes (Signed)
  Subjective:   Patient ID: Andrea Perry, female DOB: April 04, 1951, 63 y.o. MRN: [id number]   Andrea Perry is a 63 y.o. female returning for follow up of left knee complaints.   She has chronic left knee pain which is not as bothersome as feelings of instability while ambulating and occasional unprovoked painless mechanical locking of the knee. She received only limited pain relief for 2-3 days after injection at previous office visit.   She is also complaining of continued coldness and tingling in her feet bilaterally which is stable, affecting her toes and distal feet. She denies history of diabetes. Gabapentin was taken as directed at previous visit. This has not provided much relief.   Review of Systems as above. Objective:   Physical Exam  Gen: Obese, pleasant 63 y.o. female in NAD   Left knee: Mild effusion noted without erythema or hemarthrosis on inspection. Medial joint line tenderness on palpation. Negative McMurray. Neurovascularly intact distally.   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 In light of mechanical symptoms, an MRI is indicated to investigate presence of intra-articular loose body which would be amenable to surgical intervention. We will call her with these results to determine appropriate follow up.  - Body helix knee sleeve provided - Followup PCP to discuss further workup and treatment of her bilateral lower extremity neuropathy  Jupiter Boys B. Bonner Puna, MD, PGY-2 11/28/2013 12:35 PM

## 2013-11-30 ENCOUNTER — Ambulatory Visit
Admission: RE | Admit: 2013-11-30 | Discharge: 2013-11-30 | Disposition: A | Discharge status: Home / Self Care | Payer: Medicare Other | Source: Ambulatory Visit | Attending: Sports Medicine | Admitting: Sports Medicine

## 2013-11-30 DIAGNOSIS — M25562 Pain in left knee: Secondary | ICD-10-CM

## 2013-12-04 ENCOUNTER — Telehealth: Payer: Self-pay | Admitting: Sports Medicine

## 2013-12-04 NOTE — Telephone Encounter (Signed)
I spoke with the patient on the phone today after reviewing the MRI of her left knee. There is a possible re- tear of the medial meniscus but postoperative changes cannot be completely excluded. She also has findings consistent with moderate medial compartmental DJD and several possible loose bodies. Her arthroscopy by Dr. Noemi Chapel was done some time last year. I recommended that she return to him to discuss the possibility of a repeat arthroscopy for loose body removal and better evaluation of the possible medial meniscal tear. She will contact his office directly. She would also like to try some off-the-shelf arch supports. She has a history of pes planus and is asking about the possibility of this contributing to knee pain. It is certainly a possibility and I think that she is okay trying a pair of arch supports. Followup with me when necessary.

## 2013-12-27 ENCOUNTER — Encounter: Payer: Self-pay | Admitting: *Deleted

## 2013-12-27 NOTE — Patient Instructions (Signed)
DR Bonner Puna 12/28/13 AT 4PM [phone]

## 2013-12-31 ENCOUNTER — Other Ambulatory Visit: Payer: Self-pay | Admitting: Internal Medicine

## 2014-01-11 ENCOUNTER — Encounter (INDEPENDENT_AMBULATORY_CARE_PROVIDER_SITE_OTHER): Payer: Self-pay

## 2014-01-11 ENCOUNTER — Ambulatory Visit (INDEPENDENT_AMBULATORY_CARE_PROVIDER_SITE_OTHER): Payer: Medicare Other | Admitting: Emergency Medicine

## 2014-01-11 ENCOUNTER — Encounter: Payer: Self-pay | Admitting: Emergency Medicine

## 2014-01-11 VITALS — BP 146/98 | HR 67 | Temp 97.6°F | Wt 252.0 lb

## 2014-01-11 DIAGNOSIS — J45909 Unspecified asthma, uncomplicated: Secondary | ICD-10-CM

## 2014-01-11 DIAGNOSIS — J449 Chronic obstructive pulmonary disease, unspecified: Secondary | ICD-10-CM

## 2014-01-11 NOTE — Progress Notes (Signed)
Subjective:    Patient ID: Andrea Perry, female    DOB: [birth year], 63 y.o.   MRN: [id number]  HPI 63 yo woman, hx of childhood asthma, hx tobacco (45 pk-yrs), allergies, GERD, carries a dx of COPD and adult asthma made by Dr Jenny Reichmann. She has worked in a [employer] and had to have spirometry as a part of her job. She is referred today for her COPD and also for pre-op eval for L knee surgery w Dr Noemi Chapel. She describes some exertional dyspnea, happens after 19ft. She has some occasional cough, non-productive. She sometimes hears wheeze. She has had episodes of light-headedness when walking an extended distance.    Review of Systems  Constitutional: Negative for fever and unexpected weight change.  HENT: Negative for congestion, dental problem, ear pain, nosebleeds, postnasal drip, rhinorrhea, sinus pressure, sneezing, sore throat and trouble swallowing.   Eyes: Negative for redness and itching.  Respiratory: Positive for cough and shortness of breath. Negative for chest tightness and wheezing.   Cardiovascular: Negative for palpitations and leg swelling.  Gastrointestinal: Negative for nausea and vomiting.  Genitourinary: Negative for dysuria.  Musculoskeletal: Negative for joint swelling.  Skin: Negative for rash.  Neurological: Negative for headaches.  Hematological: Does not bruise/bleed easily.  Psychiatric/Behavioral: Negative for dysphoric mood. The patient is not nervous/anxious.     Past Medical History  Diagnosis Date  . Impaired glucose tolerance 09/21/2010  . ALLERGIC RHINITIS 08/16/2008  . ANXIETY 08/16/2008  . ASTHMA 08/16/2008  . COPD 08/16/2008  . DEPRESSION 08/16/2008  . GENITAL HERPES 12/03/2006  . HEMATOCHEZIA 06/20/2009  . HYPERLIPIDEMIA 08/16/2008  . HYPERTENSION 12/03/2006  . IBS 06/20/2009    hx per patient, no current problem  . Hemorrhoids   . HYPOTHYROIDISM 12/03/2006    History only - no current problem  . GERD (gastroesophageal reflux disease)   . H/O hiatal hernia    . Arthritis     hands, knees, lower back  . Chronic lower back pain     problems with disc L2-5     Family History  Problem Relation Age of Onset  . Diabetes Mother   . Hyperlipidemia Mother   . Hypertension Mother   . Stroke Father   . Colon polyps Mother   . Colon cancer Neg Hx      History   Social History  . Marital Status: Widowed    Spouse Name: N/A    Number of Children: 1  . Years of Education: N/A   Occupational History  . currently unemployed, former [employer]    Social History Main Topics  . Smoking status: Former Smoker -- 1.50 packs/day for 30 years    Types: Cigarettes    Quit date: 04/21/2003  . Smokeless tobacco: Never Used  . Alcohol Use: No  . Drug Use: No  . Sexual Activity: Not Currently    Birth Control/ Protection: None   Other Topics Concern  . Not on file   Social History Narrative   3 caffeine drinks daily   has worked in [employer] in the past.   Allergies  Allergen Reactions  . Codeine Nausea And Vomiting  . Tramadol Hcl Nausea And Vomiting    REACTION: mouth dryness, headache  . Vicodin [Hydrocodone-Acetaminophen] Nausea And Vomiting     Outpatient Prescriptions Prior to Visit  Medication Sig Dispense Refill  . acetaminophen (TYLENOL) 650 MG CR tablet Take 1,300 mg by mouth 2 (two) times daily.      Marland Kitchen  albuterol (VENTOLIN HFA) 108 (90 BASE) MCG/ACT inhaler Inhale 2 puffs into the lungs every 6 (six) hours as needed.  3 Inhaler  3  . aspirin 81 MG EC tablet Take 81 mg by mouth daily.        Marland Kitchen b complex vitamins tablet Take 1 tablet by mouth daily.      . Bepotastine Besilate (BEPREVE) 1.5 % SOLN Place 1 drop into both eyes daily as needed (for allergies).      . cetirizine (ZYRTEC) 10 MG tablet Take 10 mg by mouth daily.       . cholecalciferol (VITAMIN D) 1000 UNITS tablet Take 1,000 Units by mouth daily.      Marland Kitchen escitalopram (LEXAPRO) 10 MG tablet TAKE ONE TABLET BY MOUTH EVERY DAY  90 tablet  3  . estradiol  (ESTRACE) 2 MG tablet TAKE ONE TABLET BY MOUTH EVERY DAY  90 tablet  3  . Fluticasone-Salmeterol (ADVAIR DISKUS) 500-50 MCG/DOSE AEPB Inhale 1 puff into the lungs 2 (two) times daily.  60 each  11  . furosemide (LASIX) 40 MG tablet TAKE ONE TABLET BY MOUTH ONCE DAILY  90 tablet  1  . Garlic 5170 MG CAPS Take 1 capsule by mouth at bedtime.       . irbesartan (AVAPRO) 150 MG tablet Take 1 tablet (150 mg total) by mouth daily.  90 tablet  3  . LORazepam (ATIVAN) 1 MG tablet Take 1 tablet (1 mg total) by mouth at bedtime as needed for anxiety.  30 tablet  5  . meloxicam (MOBIC) 15 MG tablet       . metoprolol (LOPRESSOR) 50 MG tablet TAKE ONE TABLET BY MOUTH TWICE DAILY  180 tablet  3  . Multiple Vitamin (MULTIVITAMIN) tablet Take 1 tablet by mouth daily.        . Omega-3 Fatty Acids (FISH OIL) 1200 MG CAPS Take 1 capsule by mouth daily.       . pantoprazole (PROTONIX) 40 MG tablet TAKE ONE TABLET BY MOUTH ONCE DAILY  90 tablet  1  . potassium gluconate 595 MG TABS Take 595 mg by mouth every other day.      . pravastatin (PRAVACHOL) 40 MG tablet TAKE ONE TABLET BY MOUTH ONCE DAILY  90 tablet  3  . vitamin C (ASCORBIC ACID) 500 MG tablet Take 500 mg by mouth daily as needed (for cold symptoms).      . vitamin E 400 UNIT capsule Take 400 Units by mouth daily.      Marland Kitchen gabapentin (NEURONTIN) 300 MG capsule Take one qhs for 7 days then 2 qhs  90 capsule  3  . predniSONE (DELTASONE) 10 MG tablet       . predniSONE (DELTASONE) 10 MG tablet 3 tabs by mouth per day for 3 days,2tabs per day for 3 days,1tab per day for 3 days  18 tablet  0   No facility-administered medications prior to visit.       Objective:   Physical Exam Filed Vitals:   01/11/14 1546  BP: 146/98  Pulse: 67  Temp: 97.6 F (36.4 C)  TempSrc: Oral  Weight: 252 lb (114.306 kg)  SpO2: 97%        Assessment & Plan:  ASTHMA Hx of childhood asthma and probably with component of asthma now as well, compounded by her smoking hx.  She is on Advair and has done well on it. Discussed with her today the other BD options and their potential benefits. Need  to quantify her AFL, then can discuss whether an alternative would make sense. She is planning for knee surgery- PFT will help risk stratify. A large part of her dyspnea likely result of deconditioning.  - spirometry now > FEV1 59% predicted -  Walking oximetry > no desaturation - add spiriva respimat 2 sprays daily to see if she benefits.  - rov 1  Pt is at moderately increased risk for surgery, but nothing to preclude having her L knee surgery. Will forward this information to Dr Noemi Chapel. Hopefully starting Spiriva will decrease risk for peri-op problems.    COPD Please see discussion in the asthma section; needs full PFT, walking oximetry, consideration of alternative BD

## 2014-01-11 NOTE — Patient Instructions (Signed)
Please continue your Advair twice a day Start Spiriva 2 sprays daily You are at a moderately increased risk for surgery due to your lung disease, but this does not mean that you cannot have the operation if the benefits outweigh this risk.  Follow with Dr Lamonte Sakai in 1 month

## 2014-01-11 NOTE — Assessment & Plan Note (Addendum)
Hx of childhood asthma and probably with component of asthma now as well, compounded by her smoking hx. She is on Advair and has done well on it. Discussed with her today the other BD options and their potential benefits. Need to quantify her AFL, then can discuss whether an alternative would make sense. She is planning for knee surgery- PFT will help risk stratify. A large part of her dyspnea likely result of deconditioning.  - spirometry now > FEV1 59% predicted -  Walking oximetry > no desaturation - add spiriva respimat 2 sprays daily to see if she benefits.  - rov 1  Pt is at moderately increased risk for surgery, but nothing to preclude having her L knee surgery. Will forward this information to Dr Noemi Chapel. Hopefully starting Spiriva will decrease risk for peri-op problems.

## 2014-01-11 NOTE — Assessment & Plan Note (Signed)
Please see discussion in the asthma section; needs full PFT, walking oximetry, consideration of alternative BD

## 2014-02-05 ENCOUNTER — Ambulatory Visit (INDEPENDENT_AMBULATORY_CARE_PROVIDER_SITE_OTHER): Payer: Medicare Other | Admitting: Cardiovascular Disease

## 2014-02-05 ENCOUNTER — Encounter: Payer: Self-pay | Admitting: Cardiovascular Disease

## 2014-02-05 VITALS — BP 146/88 | HR 125 | Ht 62.0 in | Wt 252.0 lb

## 2014-02-05 DIAGNOSIS — Z01810 Encounter for preprocedural cardiovascular examination: Secondary | ICD-10-CM | POA: Insufficient documentation

## 2014-02-05 DIAGNOSIS — M129 Arthropathy, unspecified: Secondary | ICD-10-CM

## 2014-02-05 DIAGNOSIS — J439 Emphysema, unspecified: Secondary | ICD-10-CM

## 2014-02-05 DIAGNOSIS — I4891 Unspecified atrial fibrillation: Secondary | ICD-10-CM | POA: Insufficient documentation

## 2014-02-05 DIAGNOSIS — M1710 Unilateral primary osteoarthritis, unspecified knee: Secondary | ICD-10-CM | POA: Insufficient documentation

## 2014-02-05 DIAGNOSIS — I4819 Other persistent atrial fibrillation: Secondary | ICD-10-CM | POA: Insufficient documentation

## 2014-02-05 DIAGNOSIS — E785 Hyperlipidemia, unspecified: Secondary | ICD-10-CM

## 2014-02-05 DIAGNOSIS — I480 Paroxysmal atrial fibrillation: Secondary | ICD-10-CM

## 2014-02-05 LAB — BASIC METABOLIC PANEL
BUN: 16 mg/dL (ref 6–23)
CO2: 27 mEq/L (ref 19–32)
Calcium: 8.5 mg/dL (ref 8.4–10.5)
Chloride: 103 mEq/L (ref 96–112)
Creatinine, Ser: 0.9 mg/dL (ref 0.4–1.2)
GFR: 69.41 mL/min (ref >60.00)
Glucose, Bld: 156 mg/dL — ABNORMAL HIGH (ref 70–99)
POTASSIUM: 4.7 meq/L (ref 3.5–5.1)
Sodium: 141 mEq/L (ref 135–145)

## 2014-02-05 MED ORDER — DILTIAZEM HCL ER BEADS 300 MG PO CP24: 300.0000 mg | ORAL_CAPSULE | Freq: Every day | ORAL | Status: DC

## 2014-02-05 MED ORDER — RIVAROXABAN 20 MG PO TABS: 20.0000 mg | ORAL_TABLET | Freq: Every day | ORAL | Status: DC

## 2014-02-05 NOTE — Progress Notes (Signed)
Patient ID: Andrea Perry, female   DOB: [birth year], 63 y.o.   MRN: [id number]   63 yo referred by Dr Noemi Chapel for preop clearance  She has significant arthritis of both knees.  Previous left knee surgery 1.5 years ago with no complications.  She has severe COPD.  Primary is Dr Jenny Reichmann.  No documented CAD.  She described chest pain intermitant not always exertional  Central with radiation to left.   CRF HTN and elevated lipids on Rx  She was seen by pulmonary and started on inhalers Dr Lamonte Sakai felt moderate risk for surgery FEV1 59% predicted no desats  Also felt deconditioning had some to do with her dyspnea.  Today she was noted to be in rapid afib.  No history of but does get palpitations No contraindications to anticoagulation  Her knee surgery is elective  She has noted increasing dyspnea over the last 6-8 months No cough , mild wheezing        ROS: Denies fever, malais, weight loss, blurry vision, decreased visual acuity, cough, sputum, SOB, hemoptysis, pleuritic pain, palpitaitons, heartburn, abdominal pain, melena, lower extremity edema, claudication, or rash.  All other systems reviewed and negative   General: Affect appropriate Obese female  HEENT: normal Neck supple with no adenopathy JVP normal no bruits no thyromegaly Lungs poor inspitory mild exp wheezing and good diaphragmatic motion Heart:  S1/S2 no murmur,rub, gallop or click PMI normal Abdomen: benighn, BS positve, no tenderness, no AAA no bruit.  No HSM or HJR Distal pulses intact with no bruits No edema Neuro non-focal Skin warm and dry No muscular weakness  Medications Current Outpatient Prescriptions  Medication Sig Dispense Refill  . acetaminophen (TYLENOL) 650 MG CR tablet Take 1,300 mg by mouth 2 (two) times daily.      Marland Kitchen albuterol (VENTOLIN HFA) 108 (90 BASE) MCG/ACT inhaler Inhale 2 puffs into the lungs every 6 (six) hours as needed.  3 Inhaler  3  . aspirin 81 MG EC tablet Take 81 mg by mouth daily.        Marland Kitchen b  complex vitamins tablet Take 1 tablet by mouth daily.      . Bepotastine Besilate (BEPREVE) 1.5 % SOLN Place 1 drop into both eyes daily as needed (for allergies).      . cetirizine (ZYRTEC) 10 MG tablet Take 10 mg by mouth daily.       . cholecalciferol (VITAMIN D) 1000 UNITS tablet Take 1,000 Units by mouth daily.      Marland Kitchen escitalopram (LEXAPRO) 10 MG tablet TAKE ONE TABLET BY MOUTH EVERY DAY  90 tablet  3  . estradiol (ESTRACE) 2 MG tablet TAKE ONE TABLET BY MOUTH EVERY DAY  90 tablet  3  . Fluticasone-Salmeterol (ADVAIR DISKUS) 500-50 MCG/DOSE AEPB Inhale 1 puff into the lungs 2 (two) times daily.  60 each  11  . furosemide (LASIX) 40 MG tablet TAKE ONE TABLET BY MOUTH ONCE DAILY  90 tablet  1  . Garlic 4854 MG CAPS Take 1 capsule by mouth at bedtime.       . irbesartan (AVAPRO) 150 MG tablet Take 1 tablet (150 mg total) by mouth daily.  90 tablet  3  . LORazepam (ATIVAN) 1 MG tablet Take 1 tablet (1 mg total) by mouth at bedtime as needed for anxiety.  30 tablet  5  . meloxicam (MOBIC) 15 MG tablet       . metoprolol (LOPRESSOR) 50 MG tablet TAKE ONE TABLET BY MOUTH TWICE DAILY  180 tablet  3  . Multiple Vitamin (MULTIVITAMIN) tablet Take 1 tablet by mouth daily.        . Omega-3 Fatty Acids (FISH OIL) 1200 MG CAPS Take 1 capsule by mouth daily.       . pantoprazole (PROTONIX) 40 MG tablet TAKE ONE TABLET BY MOUTH ONCE DAILY  90 tablet  1  . potassium gluconate 595 MG TABS Take 595 mg by mouth every other day.      . pravastatin (PRAVACHOL) 40 MG tablet TAKE ONE TABLET BY MOUTH ONCE DAILY  90 tablet  3  . vitamin C (ASCORBIC ACID) 500 MG tablet Take 500 mg by mouth daily as needed (for cold symptoms).      . vitamin E 400 UNIT capsule Take 400 Units by mouth daily.       No current facility-administered medications for this visit.    Allergies Codeine; Tramadol hcl; and Vicodin  Family History: Family History  Problem Relation Age of Onset  . Diabetes Mother   . Hyperlipidemia  Mother   . Hypertension Mother   . Stroke Father   . Colon polyps Mother   . Colon cancer Neg Hx     Social History: History   Social History  . Marital Status: Widowed    Spouse Name: N/A    Number of Children: 1  . Years of Education: N/A   Occupational History  . currently unemployed, former [employer]    Social History Main Topics  . Smoking status: Former Smoker -- 1.50 packs/day for 30 years    Types: Cigarettes    Quit date: 04/21/2003  . Smokeless tobacco: Never Used  . Alcohol Use: No  . Drug Use: No  . Sexual Activity: Not Currently    Birth Control/ Protection: None   Other Topics Concern  . Not on file   Social History Narrative   3 caffeine drinks daily     Electrocardiogram:  Rapid afib rate 125  Nonspecific ST changes   Assessment and Plan

## 2014-02-05 NOTE — Assessment & Plan Note (Signed)
Previous notes make no mention of this Clinically with palpitations for months.  Add cardizem to beta blocker for rate control  Echo to assess EF and atrial sizes.  Xarelto for anticoagulation BMET today  Diagnosis natural history and risk of anticoagulation discussed with patient.  F/U 3 weeks to consider [facility].

## 2014-02-05 NOTE — Assessment & Plan Note (Signed)
Not clear to have elective surgery in light of apparent new onset rapid afib  Given anticoagulation issues will not likely be clear for 3 months

## 2014-02-05 NOTE — Assessment & Plan Note (Signed)
Cholesterol is at goal.  Continue current dose of statin and diet Rx.  No myalgias or side effects.  F/U  LFT's in 6 months. Lab Results  Component Value Date   LDLCALC 71 10/03/2013

## 2014-02-05 NOTE — Assessment & Plan Note (Signed)
Ok to have injections not clear for surgery Tramadol would be good for pain avoid COX-2 inhibitors

## 2014-02-05 NOTE — Patient Instructions (Signed)
Your physician recommends that you schedule a follow-up appointment in:   [place] has recommended you make the following change in your medication:  START  XARELTO   20 MG   1 EVERY  PM CARDIZEM  300 MG  [place]  Your physician recommends that you return for lab work in:  [place] has requested that you have an echocardiogram. Echocardiography is a painless test that uses sound waves to create images of your heart. It provides your doctor with information about the size and shape of your heart and how well your heart's chambers and valves are working. This procedure takes approximately one hour. There are no restrictions for this procedure.

## 2014-02-05 NOTE — Assessment & Plan Note (Signed)
Per Dr Lamonte Sakai  Using inhalers  Multifactorial dyspnea but certainly worse with rapid afib.

## 2014-02-08 ENCOUNTER — Telehealth: Payer: Self-pay | Admitting: Cardiovascular Disease

## 2014-02-08 MED ORDER — APIXABAN 5 MG PO TABS: 5.0000 mg | ORAL_TABLET | Freq: Two times a day (BID) | ORAL | Status: DC

## 2014-02-08 MED ORDER — METOPROLOL TARTRATE 50 MG PO TABS: ORAL_TABLET | ORAL | Status: DC

## 2014-02-08 NOTE — Telephone Encounter (Signed)
On 10/19 pt saw Dr. Johnsie Cancel. Was in afib at visit and started on cardizem and xarelto.  On 10/21 she woke up with edema to her ankles and hands. "my hands feel like they are going to bust". Still swollen today, no change.  Adv will route to Dr. Johnsie Cancel to advise and will call patient back after hearing back.

## 2014-02-08 NOTE — Telephone Encounter (Signed)
Instructed patient. Verbalizes understanding. Appointment 10/27 with [name].

## 2014-02-08 NOTE — Telephone Encounter (Signed)
New message     Pt says her ankles and hands are swollen.  Left knee hurts causing her back to hurt.  Is this from the cardizem?

## 2014-02-08 NOTE — Telephone Encounter (Signed)
Stop cardizem  Increase metoprolol to 75 bid  Stop xarelto and start eliquis 5 bid f/u PA next week

## 2014-02-12 ENCOUNTER — Ambulatory Visit (HOSPITAL_COMMUNITY): Payer: Medicare Other | Attending: Cardiovascular Disease

## 2014-02-12 DIAGNOSIS — Z87891 Personal history of nicotine dependence: Secondary | ICD-10-CM | POA: Diagnosis not present

## 2014-02-12 DIAGNOSIS — I10 Essential (primary) hypertension: Secondary | ICD-10-CM | POA: Insufficient documentation

## 2014-02-12 DIAGNOSIS — I4891 Unspecified atrial fibrillation: Secondary | ICD-10-CM | POA: Diagnosis present

## 2014-02-12 NOTE — Progress Notes (Signed)
2D Echo completed. 02/12/2014

## 2014-02-13 ENCOUNTER — Encounter: Payer: Self-pay | Admitting: Nurse Practitioner

## 2014-02-13 ENCOUNTER — Ambulatory Visit (INDEPENDENT_AMBULATORY_CARE_PROVIDER_SITE_OTHER): Payer: Medicare Other | Admitting: Nurse Practitioner

## 2014-02-13 VITALS — BP 122/80 | HR 107 | Ht 62.0 in | Wt 252.8 lb

## 2014-02-13 DIAGNOSIS — I4891 Unspecified atrial fibrillation: Secondary | ICD-10-CM

## 2014-02-13 NOTE — Progress Notes (Signed)
[place] Date of Birth: February 01, 1951 Medical Record #[id number]  History of Present Illness: Andrea Perry is seen back today for a follow up visit. Seen for Dr. Johnsie Perry. She is a 63 year old female with COPD, severe OA, HTN, CRF, HLD and no documented CAD. She has had new onset AF. She is also followed by Dr Andrea Perry who felt she was at moderate risk for surgery - FEV1 59% predicted no desats.    Had been sent here earlier this month for a pre op clearance - found to be in AF with RVR - started on Cardizem and Xarelto. Plan was to anticoagulate for 3 weeks and then proceed with cardioversion. Echo ordered as well.  She did not tolerate the CCB or Xarelto - had swelling and felt like she was "going to burst". Switched to [employer]. CCB stopped and her beta blocker dose was increased. Added to my schedule for follow up.  Comes in today. Here alone. Very hot and sweaty initially - brought her own personal fan with her. She says she is feeling better. Will have some occasional palpitations/flutters - worse if she gets anxious or stressed. Denies being dizzy or lightheaded. No syncope reported.  Feels better on her current regimen. Not taking Mobic - only using tylenol prn. No chest pain. Breathing is stable and at her personal baseline. Seeing Dr. Lamonte Perry later this week.   Current Outpatient Prescriptions  Medication Sig Dispense Refill  . acetaminophen (TYLENOL) 650 MG CR tablet Take 1,300 mg by mouth 2 (two) times daily.      Marland Kitchen apixaban (ELIQUIS) 5 MG TABS tablet Take 1 tablet (5 mg total) by mouth 2 (two) times daily.  60 tablet  6  . aspirin 81 MG EC tablet Take 81 mg by mouth daily.        Marland Kitchen b complex vitamins tablet Take 1 tablet by mouth daily.      Marland Kitchen BLACK COHOSH EXTRACT PO Take 40 mg by mouth daily.      . cetirizine (ZYRTEC) 10 MG tablet Take 10 mg by mouth daily.       . cholecalciferol (VITAMIN D) 1000 UNITS tablet Take 1,000 Units by mouth daily.      Andrea Perry Calcium (STOOL SOFTENER PO)  Take by mouth as directed.      . escitalopram (LEXAPRO) 10 MG tablet TAKE ONE TABLET BY MOUTH EVERY DAY  90 tablet  3  . estradiol (ESTRACE) 2 MG tablet TAKE ONE TABLET BY MOUTH EVERY DAY  90 tablet  3  . Fluticasone-Salmeterol (ADVAIR DISKUS) 500-50 MCG/DOSE AEPB Inhale 1 puff into the lungs 2 (two) times daily.  60 each  11  . furosemide (LASIX) 40 MG tablet TAKE ONE TABLET BY MOUTH ONCE DAILY  90 tablet  1  . Garlic 6283 MG CAPS Take 1 capsule by mouth at bedtime.       . GuaiFENesin (MUCUS RELIEF ADULT PO) Take by mouth as directed.      . irbesartan (AVAPRO) 150 MG tablet Take 1 tablet (150 mg total) by mouth daily.  90 tablet  3  . Loperamide HCl (ANTI-DIARRHEAL PO) Take by mouth as directed.      Marland Kitchen LORazepam (ATIVAN) 1 MG tablet Take 1 tablet (1 mg total) by mouth at bedtime as needed for anxiety.  30 tablet  5  . meloxicam (MOBIC) 15 MG tablet       . metoprolol (LOPRESSOR) 50 MG tablet TAKE ONE and ONE  HALF  TABLETS (1 AND 1/2) BY MOUTH TWICE DAILY      . Multiple Vitamin (MULTIVITAMIN) tablet Take 1 tablet by mouth daily.        . Omega-3 Fatty Acids (FISH OIL) 1200 MG CAPS Take 1 capsule by mouth daily.       . pantoprazole (PROTONIX) 40 MG tablet TAKE ONE TABLET BY MOUTH ONCE DAILY  90 tablet  1  . potassium gluconate 595 MG TABS Take 595 mg by mouth every other day.      . pravastatin (PRAVACHOL) 40 MG tablet TAKE ONE TABLET BY MOUTH ONCE DAILY  90 tablet  3  . Tiotropium Bromide Monohydrate (SPIRIVA RESPIMAT) 2.5 MCG/ACT AERS Inhale into the lungs.      . vitamin C (ASCORBIC ACID) 500 MG tablet Take 500 mg by mouth daily as needed (for cold symptoms).      . vitamin E 400 UNIT capsule Take 400 Units by mouth daily.       No current facility-administered medications for this visit.    Allergies  Allergen Reactions  . Codeine Nausea And Vomiting  . Tramadol Hcl Nausea And Vomiting    REACTION: mouth dryness, headache  . Vicodin [Hydrocodone-Acetaminophen] Nausea And Vomiting     Past Medical History  Diagnosis Date  . Impaired glucose tolerance 09/21/2010  . ALLERGIC RHINITIS 08/16/2008  . ANXIETY 08/16/2008  . ASTHMA 08/16/2008  . COPD 08/16/2008  . DEPRESSION 08/16/2008  . GENITAL HERPES 12/03/2006  . HEMATOCHEZIA 06/20/2009  . HYPERLIPIDEMIA 08/16/2008  . HYPERTENSION 12/03/2006  . IBS 06/20/2009    hx per patient, no current problem  . Hemorrhoids   . HYPOTHYROIDISM 12/03/2006    History only - no current problem  . GERD (gastroesophageal reflux disease)   . H/O hiatal hernia   . Arthritis     hands, knees, lower back  . Chronic lower back pain     problems with disc L2-5    Past Surgical History  Procedure Laterality Date  . Appendectomy    . Knee surgery      Right   . Tonsillectomy and adenoidectomy    . Abdominal hysterectomy    . Carpal tunnel release      Right   . Cesarean section      x 1   . Colonoscopy  2004  . Cystocele repair N/A 10/25/2012    Procedure: ANTERIOR REPAIR (CYSTOCELE);  Surgeon: Andrea Height, MD;  Location: [facility];  Service: Gynecology;  Laterality: N/A;    History  Smoking status  . Former Smoker -- 1.50 packs/day for 30 years  . Types: Cigarettes  . Quit date: 04/21/2003  Smokeless tobacco  . Never Used    History  Alcohol Use No    Family History  Problem Relation Age of Onset  . Diabetes Mother   . Hyperlipidemia Mother   . Hypertension Mother   . Stroke Father   . Colon polyps Mother   . Colon cancer Neg Hx     Review of Systems: The review of systems is per the HPI.  All other systems were reviewed and are negative.  Physical Exam: BP 122/80  Pulse 107  Ht 5\' 2"  (1.575 m)  Wt 252 lb 12.8 oz (114.669 kg)  BMI 46.23 kg/m2  SpO2 98% Patient is very pleasant and in no acute distress. Quite jolly. She is obese. Skin is warm and dry. Color is normal.  HEENT is unremarkable. Normocephalic/atraumatic. PERRL. Sclera are nonicteric. Neck is supple. No  masses. No JVD. Lungs are clear. Cardiac exam shows an   irregular rate and rhythm. HR better by EKG today. Abdomen is obese but soft. Extremities are quite full but without edema. Gait and ROM are intact. No gross neurologic deficits noted.  Wt Readings from Last 3 Encounters:  02/13/14 252 lb 12.8 oz (114.669 kg)  02/05/14 252 lb (114.306 kg)  01/11/14 252 lb (114.306 kg)    LABORATORY DATA/PROCEDURES: EKG today shows AF with a more controlled rate   Lab Results  Component Value Date   WBC 9.4 10/03/2013   HGB 13.5 10/03/2013   HCT 40.9 10/03/2013   PLT 254.0 10/03/2013   GLUCOSE 156* 02/05/2014   CHOL 152 10/03/2013   TRIG 180.0* 10/03/2013   HDL 45.00 10/03/2013   LDLDIRECT 55.7 09/27/2012   LDLCALC 71 10/03/2013   ALT 20 10/03/2013   AST 23 10/03/2013   NA 141 02/05/2014   K 4.7 02/05/2014   CL 103 02/05/2014   CREATININE 0.9 02/05/2014   BUN 16 02/05/2014   CO2 27 02/05/2014   TSH 2.72 10/03/2013   HGBA1C 6.7* 10/03/2013    BNP (last 3 results) No results found for this basename: PROBNP,  in the last 8760 hours   Echo Study Conclusions from October 2015  - Left ventricle: Inferobasal hypokinesis. The cavity size was normal. Wall thickness was normal. Systolic function was normal. The estimated ejection fraction was in the range of 50% to 55%. - Left atrium: The atrium was mildly dilated. - Atrial septum: No defect or patent foramen ovale was identified. - Pulmonary arteries: PA peak pressure: 41 mm Hg (S).    Assessment / Plan: 1. AF - uncertain duration - managed with rate control and on anticoagulation - now on Eliquis and higher doses of her Lopressor - better clinically. I have left her on her current regimen. Stopping Aspirin today. She does not use Mobic so I have taken that off of her med list. Will need follow up lab on her return OV for the Eliquis. See back as planned and cardioversion to be considered at that time.   2. Obesity   3. OA - plans for her surgery have been put on hold. Per Dr. Kyla Balzarine prior  recommendation - it will be at least 3 months before she can proceed.   4. HTN -  BP ok on current regimen  5. Chronic dyspnea/COPD - followed by Dr. Lamonte Perry  Patient is agreeable to this plan and will call if any problems develop in the interim.   Burtis Junes, RN, [place] [address] [place], [place]  [phone]

## 2014-02-13 NOTE — Patient Instructions (Addendum)
Stay on your current medicines  See Dr. Johnsie Cancel as planned - will be planning for probable cardioversion at that visit  We will check lab on your return visit  Stop aspirin  Call the [place] office at [phone] if you have any questions, problems or concerns.

## 2014-02-14 ENCOUNTER — Ambulatory Visit (INDEPENDENT_AMBULATORY_CARE_PROVIDER_SITE_OTHER): Payer: Medicare Other | Admitting: Emergency Medicine

## 2014-02-14 ENCOUNTER — Encounter: Payer: Self-pay | Admitting: Emergency Medicine

## 2014-02-14 VITALS — BP 140/86 | HR 91 | Temp 97.6°F | Ht 60.0 in | Wt 255.8 lb

## 2014-02-14 DIAGNOSIS — Z23 Encounter for immunization: Secondary | ICD-10-CM

## 2014-02-14 DIAGNOSIS — J439 Emphysema, unspecified: Secondary | ICD-10-CM

## 2014-02-14 NOTE — Patient Instructions (Signed)
Please continue your Advair twice a day Stop your Spiriva for now. If you lose ground off of this then we will probably restart  Continue your Zyrtec daily Use albuterol as needed Flu shot today prevnar-13 in the future Follow with Dr Lamonte Sakai in 6 months or sooner if you have any problems

## 2014-02-14 NOTE — Assessment & Plan Note (Signed)
Difficult for me to say whether she is truly benefited from Spiriva since she was simultaneously discovered to have atrial fibrillation and this is now better rate controlled. She wonders herself whether treatment of her atrial fibrillation has been the most important change. We will try stopping the Spiriva and see if she notices a decrease in her pulmonary status. If so then we will restart.  Please continue your Advair twice a day Stop your Spiriva for now. If you lose ground off of this then we will probably restart  Continue your Zyrtec daily Use albuterol as needed Flu shot today prevnar-13 in the future Follow with Dr Lamonte Sakai in 6 months or sooner if you have any problems

## 2014-02-14 NOTE — Progress Notes (Signed)
   Subjective:    Patient ID: Andrea Perry, female    DOB: 01-28-1951, 63 y.o.   MRN: [id number]  HPI 63 yo woman, hx of childhood asthma, hx tobacco (45 pk-yrs), allergies, GERD, carries a dx of COPD and adult asthma made by Dr Jenny Reichmann. She has worked in a [employer] and had to have spirometry as a part of her job. She is referred today for her COPD and also for pre-op eval for L knee surgery w Dr Noemi Chapel. She describes some exertional dyspnea, happens after 60ft. She has some occasional cough, non-productive. She sometimes hears wheeze. She has had episodes of light-headedness when walking an extended distance.   ROV 02/14/14 -- follow-up visit for COPD and history of childhood asthma. She was also recently diagnosed with atrial fibrillation - on eliquis and metoprolol. Last visit we added Spiriva to her Advair to see if she will benefit. She believed that she benefited from the [place], but note that she was having work done on her A fib and rate control at the same time.    Review of Systems  Constitutional: Negative for fever and unexpected weight change.  HENT: Negative for congestion, dental problem, ear pain, nosebleeds, postnasal drip, rhinorrhea, sinus pressure, sneezing, sore throat and trouble swallowing.   Eyes: Negative for redness and itching.  Respiratory: Positive for cough and shortness of breath. Negative for chest tightness and wheezing.   Cardiovascular: Negative for palpitations and leg swelling.  Gastrointestinal: Negative for nausea and vomiting.  Genitourinary: Negative for dysuria.  Musculoskeletal: Negative for joint swelling.  Skin: Negative for rash.  Neurological: Negative for headaches.  Hematological: Does not bruise/bleed easily.  Psychiatric/Behavioral: Negative for dysphoric mood. The patient is not nervous/anxious.        Objective:   Physical Exam Filed Vitals:   02/14/14 1409  BP: 140/86  Pulse: 91  Temp: 97.6 F (36.4 C)  TempSrc: Oral  Height:  5' (1.524 m)  Weight: 255 lb 12.8 oz (116.03 kg)  SpO2: 97%        Assessment & Plan:  COPD (chronic obstructive pulmonary disease) Difficult for me to say whether she is truly benefited from [place] since she was simultaneously discovered to have atrial fibrillation and this is now better rate controlled. She wonders herself whether treatment of her atrial fibrillation has been the most important change. We will try stopping the Spiriva and see if she notices a decrease in her pulmonary status. If so then we will restart.  Please continue your Advair twice a day Stop your Spiriva for now. If you lose ground off of this then we will probably restart  Continue your Zyrtec daily Use albuterol as needed Flu shot today prevnar-13 in the future Follow with Dr Lamonte Sakai in 6 months or sooner if you have any problems

## 2014-02-20 ENCOUNTER — Other Ambulatory Visit: Payer: Self-pay | Admitting: Internal Medicine

## 2014-03-05 ENCOUNTER — Other Ambulatory Visit: Payer: Self-pay | Admitting: Internal Medicine

## 2014-03-07 ENCOUNTER — Ambulatory Visit (INDEPENDENT_AMBULATORY_CARE_PROVIDER_SITE_OTHER): Payer: Medicare Other | Admitting: Cardiovascular Disease

## 2014-03-07 ENCOUNTER — Encounter: Payer: Self-pay | Admitting: Cardiovascular Disease

## 2014-03-07 ENCOUNTER — Telehealth: Payer: Self-pay | Admitting: *Deleted

## 2014-03-07 VITALS — BP 150/108 | HR 88 | Ht 62.0 in | Wt 252.0 lb

## 2014-03-07 DIAGNOSIS — I481 Persistent atrial fibrillation: Secondary | ICD-10-CM

## 2014-03-07 DIAGNOSIS — E785 Hyperlipidemia, unspecified: Secondary | ICD-10-CM

## 2014-03-07 DIAGNOSIS — I10 Essential (primary) hypertension: Secondary | ICD-10-CM

## 2014-03-07 DIAGNOSIS — I4819 Other persistent atrial fibrillation: Secondary | ICD-10-CM

## 2014-03-07 DIAGNOSIS — Z0189 Encounter for other specified special examinations: Secondary | ICD-10-CM

## 2014-03-07 NOTE — Telephone Encounter (Signed)
Pt was scheduled for a cardioversion today at office visit, Dr. Johnsie Cancel wanted pt to come in of ov 1 month after procedure. I made that appt today just need to verify the time and date with pt.

## 2014-03-07 NOTE — Assessment & Plan Note (Signed)
Well controlled.  Continue current medications and low sodium [place] type diet.    

## 2014-03-07 NOTE — Assessment & Plan Note (Signed)
Cholesterol is at goal.  Continue current dose of statin and diet Rx.  No myalgias or side effects.  F/U  LFT's in 6 months. Lab Results  Component Value Date   LDLCALC 71 10/03/2013

## 2014-03-07 NOTE — Assessment & Plan Note (Signed)
On NOAC for over 3 weeks echo ok  [place] planned for 12/1  Will need to be on anticoagulation for 3-4 weeks post so no knee surgery until likely January

## 2014-03-07 NOTE — Progress Notes (Signed)
Patient ID: Andrea Perry, female   DOB: Jun 05, 1950, 63 y.o.   MRN: [id number] 63 yo referred by Dr Noemi Chapel for preop clearance She has significant arthritis of both knees. Previous left knee surgery 1.5 years ago with no complications. She has severe COPD. Primary is Dr Jenny Reichmann. No documented CAD. She described chest pain intermitant not always exertional [place] with radiation to left. CRF HTN and elevated lipids on Rx She was seen by pulmonary and started on inhalers Dr Lamonte Sakai felt moderate risk for surgery FEV1 59% predicted no desats Also felt deconditioning had some to do with her dyspnea. Today she was noted to be in rapid afib. No history of but does get palpitations No contraindications to anticoagulation Her knee surgery is elective She has noted increasing dyspnea over the last 6-8 months No cough , mild wheezing   Echo 10/26 ok   Study Conclusions  - Left ventricle: Inferobasal hypokinesis. The cavity size was normal. Wall thickness was normal. Systolic function was normal. The estimated ejection fraction was in the range of 50% to 55%. - Left atrium: The atrium was mildly dilated. - Atrial septum: No defect or patent foramen ovale was identified. - Pulmonary arteries: PA peak pressure: 41 mm Hg (S).   She had more joint pain and xarelto changed to eliquis.  She is ready for attempt at [facility] now  Discussed procedure risk of stroke, pacer and need for anesthesia She is willing to proceed  Scheduled 12/1 with labs day before    ROS: Denies fever, malais, weight loss, blurry vision, decreased visual acuity, cough, sputum, SOB, hemoptysis, pleuritic pain, palpitaitons, heartburn, abdominal pain, melena, lower extremity edema, claudication, or rash.  All other systems reviewed and negative  General: Affect appropriate Obese white female  HEENT: normal Neck supple with no adenopathy JVP normal no bruits no thyromegaly Lungs clear with no wheezing and good diaphragmatic  motion Heart:  S1/S2 no murmur, no rub, gallop or click PMI normal Abdomen: benighn, BS positve, no tenderness, no AAA no bruit.  No HSM or HJR Distal pulses intact with no bruits No edema Neuro non-focal Skin warm and dry No muscular weakness   Current Outpatient Prescriptions  Medication Sig Dispense Refill  . acetaminophen (TYLENOL) 650 MG CR tablet Take 1,300 mg by mouth 2 (two) times daily.    Marland Kitchen apixaban (ELIQUIS) 5 MG TABS tablet Take 1 tablet (5 mg total) by mouth 2 (two) times daily. 60 tablet 6  . b complex vitamins tablet Take 1 tablet by mouth daily.    Marland Kitchen BLACK COHOSH EXTRACT PO Take 40 mg by mouth daily.    . cetirizine (ZYRTEC) 10 MG tablet Take 10 mg by mouth daily.     . cholecalciferol (VITAMIN D) 1000 UNITS tablet Take 1,000 Units by mouth daily.    Mariane Baumgarten Calcium (STOOL SOFTENER PO) Take by mouth as directed.    . escitalopram (LEXAPRO) 10 MG tablet TAKE ONE TABLET BY MOUTH ONCE DAILY 90 tablet 3  . estradiol (ESTRACE) 2 MG tablet TAKE ONE TABLET BY MOUTH ONCE DAILY 90 tablet 3  . Fluticasone-Salmeterol (ADVAIR DISKUS) 500-50 MCG/DOSE AEPB Inhale 1 puff into the lungs 2 (two) times daily. 60 each 11  . furosemide (LASIX) 40 MG tablet TAKE ONE TABLET BY MOUTH ONCE DAILY 90 tablet 1  . Garlic 8502 MG CAPS Take 1 capsule by mouth at bedtime.     . irbesartan (AVAPRO) 150 MG tablet Take 1 tablet (150 mg total) by mouth daily.  90 tablet 3  . Loperamide HCl (ANTI-DIARRHEAL PO) Take by mouth as directed.    Marland Kitchen LORazepam (ATIVAN) 1 MG tablet Take 1 tablet (1 mg total) by mouth at bedtime as needed for anxiety. 30 tablet 5  . metoprolol (LOPRESSOR) 50 MG tablet TAKE ONE and ONE  HALF TABLETS (1 AND 1/2) BY MOUTH TWICE DAILY    . Multiple Vitamin (MULTIVITAMIN) tablet Take 1 tablet by mouth daily.      . Omega-3 Fatty Acids (FISH OIL) 1200 MG CAPS Take 1 capsule by mouth daily.     . pantoprazole (PROTONIX) 40 MG tablet TAKE ONE TABLET BY MOUTH ONCE DAILY 90 tablet 1  .  potassium gluconate 595 MG TABS Take 595 mg by mouth every other day.    . pravastatin (PRAVACHOL) 40 MG tablet TAKE ONE TABLET BY MOUTH ONCE DAILY 90 tablet 3  . vitamin C (ASCORBIC ACID) 500 MG tablet Take 500 mg by mouth daily as needed (for cold symptoms).    . vitamin E 400 UNIT capsule Take 400 Units by mouth daily.    . GuaiFENesin (MUCUS RELIEF ADULT PO) Take by mouth as directed.     No current facility-administered medications for this visit.    Allergies  Codeine; Tramadol hcl; and Vicodin  Electrocardiogram:  afib rate 82    Assessment and Plan

## 2014-03-07 NOTE — Patient Instructions (Signed)
  Your physician recommends that you return for lab work on November 30 for per-cardioversion   Your provider has recommended a cardioversion.   You are scheduled for a cardioversion on December 1st  at 11:00 am with Dr. Johnsie Cancel or associates. Please go to [facility] [place] [facility] at 9:30 am.  Enter through the [facility] not have any food or drink after midnight on November 30.  You may take your medicines with a sip of water on the day of your procedure.  You will need someone to drive you home following your procedure.   Call the [place] office at [phone] if you have any questions, problems or concerns.     Electrical Cardioversion Electrical cardioversion is the delivery of a jolt of electricity to change the rhythm of the heart. Sticky patches or metal paddles are placed on the chest to deliver the electricity from a device. This is done to restore a normal rhythm. A rhythm that is too fast or not regular keeps the heart from pumping well. Electrical cardioversion is done in an emergency if:   There is low or no blood pressure as a result of the heart rhythm.   Normal rhythm must be restored as fast as possible to protect the brain and heart from further damage.   It may save a life. Cardioversion may be done for heart rhythms that are not immediately life threatening, such as atrial fibrillation or flutter, in which:   The heart is beating too fast or is not regular.   Medicine to change the rhythm has not worked.   It is safe to wait in order to allow time for preparation.  Symptoms of the abnormal rhythm are bothersome.  The risk of stroke and other serious problems can be reduced.  LET [facility] CARE PROVIDER KNOW ABOUT:   Any allergies you have.  All medicines you are taking, including vitamins, herbs, eye drops, creams, and over-the-counter medicines.  Previous problems you or members of your family  have had with the use of anesthetics.   Any blood disorders you have.   Previous surgeries you have had.   Medical conditions you have.   RISKS AND COMPLICATIONS  Generally, this is a safe procedure. However, problems can occur and include:   Breathing problems related to the anesthetic used.  A blood clot that breaks free and travels to other parts of your body. This could cause a stroke or other problems. The risk of this is lowered by use of blood-thinning medicine (anticoagulant) prior to the procedure.  Cardiac arrest (rare).   BEFORE THE PROCEDURE   You may have tests to detect blood clots in your heart and to evaluate heart function.  You may start taking anticoagulants so your blood does not clot as easily.   Medicines may be given to help stabilize your heart rate and rhythm.   PROCEDURE  You will be given medicine through an IV tube to reduce discomfort and make you sleepy (sedative).   An electrical shock will be delivered.   AFTER THE PROCEDURE Your heart rhythm will be watched to make sure it does not change. You will need someone to drive you home.

## 2014-03-19 ENCOUNTER — Other Ambulatory Visit (INDEPENDENT_AMBULATORY_CARE_PROVIDER_SITE_OTHER): Payer: Medicare Other | Admitting: *Deleted

## 2014-03-19 DIAGNOSIS — Z0189 Encounter for other specified special examinations: Secondary | ICD-10-CM

## 2014-03-19 LAB — BASIC METABOLIC PANEL
BUN: 11 mg/dL (ref 6–23)
CALCIUM: 8.1 mg/dL — AB (ref 8.4–10.5)
CO2: 27 mEq/L (ref 19–32)
CREATININE: 0.8 mg/dL (ref 0.4–1.2)
Chloride: 99 mEq/L (ref 96–112)
GFR: 83.44 mL/min (ref >60.00)
GLUCOSE: 154 mg/dL — AB (ref 70–99)
Potassium: 3.7 mEq/L (ref 3.5–5.1)
Sodium: 137 mEq/L (ref 135–145)

## 2014-03-19 LAB — CBC WITH DIFFERENTIAL/PLATELET
BASOS ABS: 0 10*3/uL (ref 0.0–0.1)
Basophils Relative: 0.3 % (ref 0.0–3.0)
EOS ABS: 0.1 10*3/uL (ref 0.0–0.7)
Eosinophils Relative: 1.6 % (ref 0.0–5.0)
HCT: 38.2 % (ref 36.0–46.0)
Hemoglobin: 12.4 g/dL (ref 12.0–15.0)
LYMPHS PCT: 29.9 % (ref 12.0–46.0)
Lymphs Abs: 2.3 10*3/uL (ref 0.7–4.0)
MCHC: 32.5 g/dL (ref 30.0–36.0)
MCV: 96.6 fl (ref 78.0–100.0)
Monocytes Absolute: 0.3 10*3/uL (ref 0.1–1.0)
Monocytes Relative: 4.2 % (ref 3.0–12.0)
Neutro Abs: 4.8 10*3/uL (ref 1.4–7.7)
Neutrophils Relative %: 64 % (ref 43.0–77.0)
PLATELETS: 207 10*3/uL (ref 150.0–400.0)
RBC: 3.95 Mil/uL (ref 3.87–5.11)
RDW: 13.3 % (ref 11.5–15.5)
WBC: 7.5 10*3/uL (ref 4.0–10.5)

## 2014-03-19 LAB — APTT: APTT: 29.4 s (ref 23.4–32.7)

## 2014-03-19 LAB — PROTIME-INR
INR: 1.2 ratio — AB (ref 0.8–1.0)
Prothrombin Time: 13.1 s (ref 9.6–13.1)

## 2014-03-19 NOTE — Interval H&P Note (Signed)
History and Physical Interval Note:  03/19/2014 3:20 PM  Andrea Perry  has presented today for surgery, with the diagnosis of A FIB   The various methods of treatment have been discussed with the patient and family. After consideration of risks, benefits and other options for treatment, the patient has consented to  Procedure(s): CARDIOVERSION (N/A) as a surgical intervention .  The patient's history has been reviewed, patient examined, no change in status, stable for surgery.  I have reviewed the patient's chart and labs.  Questions were answered to the patient's satisfaction.     Jenkins Rouge

## 2014-03-19 NOTE — H&P (View-Only) (Signed)
Patient ID: Andrea Perry, female   DOB: Jan 26, 1951, 63 y.o.   MRN: [id number] 63 yo referred by Dr Noemi Chapel for preop clearance She has significant arthritis of both knees. Previous left knee surgery 1.5 years ago with no complications. She has severe COPD. Primary is Dr Jenny Reichmann. No documented CAD. She described chest pain intermitant not always exertional Central with radiation to left. CRF HTN and elevated lipids on Rx She was seen by pulmonary and started on inhalers Dr Lamonte Sakai felt moderate risk for surgery FEV1 59% predicted no desats Also felt deconditioning had some to do with her dyspnea. Today she was noted to be in rapid afib. No history of but does get palpitations No contraindications to anticoagulation Her knee surgery is elective She has noted increasing dyspnea over the last 6-8 months No cough , mild wheezing   Echo 10/26 ok   Study Conclusions  - Left ventricle: Inferobasal hypokinesis. The cavity size was normal. Wall thickness was normal. Systolic function was normal. The estimated ejection fraction was in the range of 50% to 55%. - Left atrium: The atrium was mildly dilated. - Atrial septum: No defect or patent foramen ovale was identified. - Pulmonary arteries: PA peak pressure: 41 mm Hg (S).   She had more joint pain and xarelto changed to eliquis.  She is ready for attempt at [facility] now  Discussed procedure risk of stroke, pacer and need for anesthesia She is willing to proceed  Scheduled 12/1 with labs day before    ROS: Denies fever, malais, weight loss, blurry vision, decreased visual acuity, cough, sputum, SOB, hemoptysis, pleuritic pain, palpitaitons, heartburn, abdominal pain, melena, lower extremity edema, claudication, or rash.  All other systems reviewed and negative  General: Affect appropriate Obese white female  HEENT: normal Neck supple with no adenopathy JVP normal no bruits no thyromegaly Lungs clear with no wheezing and good diaphragmatic  motion Heart:  S1/S2 no murmur, no rub, gallop or click PMI normal Abdomen: benighn, BS positve, no tenderness, no AAA no bruit.  No HSM or HJR Distal pulses intact with no bruits No edema Neuro non-focal Skin warm and dry No muscular weakness   Current Outpatient Prescriptions  Medication Sig Dispense Refill  . acetaminophen (TYLENOL) 650 MG CR tablet Take 1,300 mg by mouth 2 (two) times daily.    Marland Kitchen apixaban (ELIQUIS) 5 MG TABS tablet Take 1 tablet (5 mg total) by mouth 2 (two) times daily. 60 tablet 6  . b complex vitamins tablet Take 1 tablet by mouth daily.    Marland Kitchen BLACK COHOSH EXTRACT PO Take 40 mg by mouth daily.    . cetirizine (ZYRTEC) 10 MG tablet Take 10 mg by mouth daily.     . cholecalciferol (VITAMIN D) 1000 UNITS tablet Take 1,000 Units by mouth daily.    Mariane Baumgarten Calcium (STOOL SOFTENER PO) Take by mouth as directed.    . escitalopram (LEXAPRO) 10 MG tablet TAKE ONE TABLET BY MOUTH ONCE DAILY 90 tablet 3  . estradiol (ESTRACE) 2 MG tablet TAKE ONE TABLET BY MOUTH ONCE DAILY 90 tablet 3  . Fluticasone-Salmeterol (ADVAIR DISKUS) 500-50 MCG/DOSE AEPB Inhale 1 puff into the lungs 2 (two) times daily. 60 each 11  . furosemide (LASIX) 40 MG tablet TAKE ONE TABLET BY MOUTH ONCE DAILY 90 tablet 1  . Garlic 3154 MG CAPS Take 1 capsule by mouth at bedtime.     . irbesartan (AVAPRO) 150 MG tablet Take 1 tablet (150 mg total) by mouth daily.  90 tablet 3  . Loperamide HCl (ANTI-DIARRHEAL PO) Take by mouth as directed.    Marland Kitchen LORazepam (ATIVAN) 1 MG tablet Take 1 tablet (1 mg total) by mouth at bedtime as needed for anxiety. 30 tablet 5  . metoprolol (LOPRESSOR) 50 MG tablet TAKE ONE and ONE  HALF TABLETS (1 AND 1/2) BY MOUTH TWICE DAILY    . Multiple Vitamin (MULTIVITAMIN) tablet Take 1 tablet by mouth daily.      . Omega-3 Fatty Acids (FISH OIL) 1200 MG CAPS Take 1 capsule by mouth daily.     . pantoprazole (PROTONIX) 40 MG tablet TAKE ONE TABLET BY MOUTH ONCE DAILY 90 tablet 1  .  potassium gluconate 595 MG TABS Take 595 mg by mouth every other day.    . pravastatin (PRAVACHOL) 40 MG tablet TAKE ONE TABLET BY MOUTH ONCE DAILY 90 tablet 3  . vitamin C (ASCORBIC ACID) 500 MG tablet Take 500 mg by mouth daily as needed (for cold symptoms).    . vitamin E 400 UNIT capsule Take 400 Units by mouth daily.    . GuaiFENesin (MUCUS RELIEF ADULT PO) Take by mouth as directed.     No current facility-administered medications for this visit.    Allergies  Codeine; Tramadol hcl; and Vicodin  Electrocardiogram:  afib rate 82    Assessment and Plan

## 2014-03-20 ENCOUNTER — Encounter (HOSPITAL_COMMUNITY)
Admission: RE | Disposition: A | Discharge status: Home / Self Care | Payer: Self-pay | Source: Ambulatory Visit | Attending: Cardiovascular Disease

## 2014-03-20 ENCOUNTER — Ambulatory Visit (HOSPITAL_COMMUNITY): Payer: Medicare Other | Admitting: Anesthesiology

## 2014-03-20 ENCOUNTER — Encounter (HOSPITAL_COMMUNITY): Payer: Self-pay | Admitting: *Deleted

## 2014-03-20 ENCOUNTER — Telehealth: Payer: Self-pay | Admitting: Cardiovascular Disease

## 2014-03-20 ENCOUNTER — Ambulatory Visit (HOSPITAL_COMMUNITY)
Admission: RE | Admit: 2014-03-20 | Discharge: 2014-03-20 | Disposition: A | Discharge status: Home / Self Care | Payer: Medicare Other | Source: Ambulatory Visit | Attending: Cardiovascular Disease | Admitting: Cardiovascular Disease

## 2014-03-20 DIAGNOSIS — I129 Hypertensive chronic kidney disease with stage 1 through stage 4 chronic kidney disease, or unspecified chronic kidney disease: Secondary | ICD-10-CM | POA: Insufficient documentation

## 2014-03-20 DIAGNOSIS — N189 Chronic kidney disease, unspecified: Secondary | ICD-10-CM | POA: Insufficient documentation

## 2014-03-20 DIAGNOSIS — I4891 Unspecified atrial fibrillation: Secondary | ICD-10-CM

## 2014-03-20 DIAGNOSIS — K219 Gastro-esophageal reflux disease without esophagitis: Secondary | ICD-10-CM | POA: Diagnosis not present

## 2014-03-20 DIAGNOSIS — Z7901 Long term (current) use of anticoagulants: Secondary | ICD-10-CM | POA: Insufficient documentation

## 2014-03-20 DIAGNOSIS — M1990 Unspecified osteoarthritis, unspecified site: Secondary | ICD-10-CM | POA: Insufficient documentation

## 2014-03-20 DIAGNOSIS — E039 Hypothyroidism, unspecified: Secondary | ICD-10-CM | POA: Diagnosis not present

## 2014-03-20 DIAGNOSIS — Z87891 Personal history of nicotine dependence: Secondary | ICD-10-CM | POA: Diagnosis not present

## 2014-03-20 DIAGNOSIS — J449 Chronic obstructive pulmonary disease, unspecified: Secondary | ICD-10-CM | POA: Insufficient documentation

## 2014-03-20 DIAGNOSIS — J45909 Unspecified asthma, uncomplicated: Secondary | ICD-10-CM | POA: Diagnosis not present

## 2014-03-20 DIAGNOSIS — Z79899 Other long term (current) drug therapy: Secondary | ICD-10-CM

## 2014-03-20 HISTORY — PX: CARDIOVERSION: SHX1299

## 2014-03-20 SURGERY — CARDIOVERSION
Anesthesia: General

## 2014-03-20 MED ORDER — METOPROLOL TARTRATE 1 MG/ML IV SOLN: 5.0000 mg | Freq: Once | INTRAVENOUS | Status: DC

## 2014-03-20 MED ORDER — SODIUM CHLORIDE 0.9 % IV SOLN
INTRAVENOUS | Status: DC | PRN
  Administered 2014-03-20: 11:00:00 via INTRAVENOUS

## 2014-03-20 MED ORDER — SODIUM CHLORIDE 0.9 % IV SOLN
INTRAVENOUS | Status: DC
  Administered 2014-03-20: 500 mL via INTRAVENOUS

## 2014-03-20 MED ORDER — PROPOFOL 10 MG/ML IV BOLUS
INTRAVENOUS | Status: DC | PRN
  Administered 2014-03-20: 100 mg via INTRAVENOUS

## 2014-03-20 MED ORDER — LIDOCAINE HCL (CARDIAC) 20 MG/ML IV SOLN
INTRAVENOUS | Status: DC | PRN
  Administered 2014-03-20: 40 mg via INTRAVENOUS

## 2014-03-20 MED ORDER — METOPROLOL TARTRATE 1 MG/ML IV SOLN
INTRAVENOUS | Status: AC
  Filled 2014-03-20: qty 5

## 2014-03-20 NOTE — Anesthesia Preprocedure Evaluation (Addendum)
Anesthesia Evaluation  Patient identified by MRN, date of birth, ID band Patient awake  General Assessment Comment:Pt had sip coffee 6am,very small piece sausage 7:30  Reviewed: Allergy & Precautions, H&P , NPO status   History of Anesthesia Complications Negative for: history of anesthetic complications  Airway Mallampati: III       Dental  (+) Teeth Intact   Pulmonary asthma , COPDformer smoker,  breath sounds clear to auscultation        Cardiovascular hypertension, + dysrhythmias Atrial Fibrillation Rhythm:Irregular Rate:Normal     Neuro/Psych    GI/Hepatic hiatal hernia, GERD-  ,  Endo/Other  Hypothyroidism Morbid obesity  Renal/GU      Musculoskeletal  (+) Arthritis -,   Abdominal (+) + obese,   Peds  Hematology   Anesthesia Other Findings   Reproductive/Obstetrics                           Anesthesia Physical Anesthesia Plan  ASA: III  Anesthesia Plan: General   Post-op Pain Management:    Induction: Intravenous  Airway Management Planned: Mask  Additional Equipment:   Intra-op Plan:   Post-operative Plan:   Informed Consent: I have reviewed the patients History and Physical, chart, labs and discussed the procedure including the risks, benefits and alternatives for the proposed anesthesia with the patient or authorized representative who has indicated his/her understanding and acceptance.     Plan Discussed with:   Anesthesia Plan Comments:         Anesthesia Quick Evaluation

## 2014-03-20 NOTE — CV Procedure (Signed)
DCC:  On Rx anticoagulation Propofol Anesthesia  [place]  120/200/200 synch biphasic Remained in afib with no sinus beats  Impression:  Failed [facility] will start antiarrhythmic agent and try [place] again in 6-8 weeks continue anticoagulation and rate control  Jenkins Rouge

## 2014-03-20 NOTE — Discharge Instructions (Signed)
Electrical Cardioversion, Care After Refer to this sheet in the next few weeks. These instructions provide you with information on caring for yourself after your procedure. Your health care provider may also give you more specific instructions. Your treatment has been planned according to current medical practices, but problems sometimes occur. Call your health care provider if you have any problems or questions after your procedure. WHAT TO EXPECT AFTER THE PROCEDURE After your procedure, it is typical to have the following sensations:  Some redness on the skin where the shocks were delivered. If this is tender, a sunburn lotion or hydrocortisone cream may help.  Possible return of an abnormal heart rhythm within hours or days after the procedure. HOME CARE INSTRUCTIONS  Take medicines only as directed by your health care provider. Be sure you understand how and when to take your medicine.  Learn how to feel your pulse and check it often.  Limit your activity for 48 hours after the procedure or as directed by your health care provider.  Avoid or minimize caffeine and other stimulants as directed by your health care provider. SEEK MEDICAL CARE IF:  You feel like your heart is beating too fast or your pulse is not regular.  You have any questions about your medicines.  You have bleeding that will not stop. SEEK IMMEDIATE MEDICAL CARE IF:  You are dizzy or feel faint.  It is hard to breathe or you feel short of breath.  There is a change in discomfort in your chest.  Your speech is slurred or you have trouble moving an arm or leg on one side of your body.  You get a serious muscle cramp that does not go away.  Your fingers or toes turn cold or blue. Document Released: 01/25/2013 Document Revised: 08/21/2013 Document Reviewed: 01/25/2013 [facility] Patient Information 2015 [place], [place]. This information is not intended to replace advice given to you by your health care provider.  Make sure you discuss any questions you have with your health care provider.  Office will call you with new medicine to start for antiarrhythmic  Jenkins Rouge

## 2014-03-20 NOTE — Telephone Encounter (Signed)
New message      Pt had cardioversion today.  She is supposed to get a new presc but it was not called in.  Please call in presc to [facility]

## 2014-03-20 NOTE — Interval H&P Note (Signed)
History and Physical Interval Note:  03/20/2014 7:43 AM  Andrea Perry  has presented today for surgery, with the diagnosis of A FIB   The various methods of treatment have been discussed with the patient and family. After consideration of risks, benefits and other options for treatment, the patient has consented to  Procedure(s): CARDIOVERSION (N/A) as a surgical intervention .  The patient's history has been reviewed, patient examined, no change in status, stable for surgery.  I have reviewed the patient's chart and labs.  Questions were answered to the patient's satisfaction.     Jenkins Rouge

## 2014-03-20 NOTE — Anesthesia Procedure Notes (Signed)
Procedure Name: MAC Date/Time: 03/20/2014 11:11 AM Performed by: Kyung Rudd Pre-anesthesia Checklist: Patient identified, Emergency Drugs available, Suction available, Patient being monitored and Timeout performed Patient Re-evaluated:Patient Re-evaluated prior to inductionOxygen Delivery Method: Ambu bag Preoxygenation: Pre-oxygenation with 100% oxygen Intubation Type: IV induction Ventilation: Mask ventilation without difficulty Dental Injury: Teeth and Oropharynx as per pre-operative assessment

## 2014-03-20 NOTE — Anesthesia Postprocedure Evaluation (Signed)
  Anesthesia Post-op Note  Patient: Andrea Perry  Procedure(s) Performed: Procedure(s): CARDIOVERSION (N/A)  Patient Location: Endoscopy Unit  Anesthesia Type:General  Level of Consciousness: awake, alert  and oriented  Airway and Oxygen Therapy: Patient Spontanous Breathing and Patient connected to nasal cannula oxygen  Post-op Pain: none  Post-op Assessment: Post-op Vital signs reviewed, Patient's Cardiovascular Status Stable, Respiratory Function Stable, Patent Airway and No signs of Nausea or vomiting  Post-op Vital Signs: Reviewed and stable  Last Vitals:  Filed Vitals:   03/20/14 1126  BP: 164/99  Pulse: 103  Temp: 36.6 C  Resp: 14    Complications: No apparent anesthesia complications

## 2014-03-20 NOTE — Transfer of Care (Signed)
Immediate Anesthesia Transfer of Care Note  Patient: Andrea Perry  Procedure(s) Performed: Procedure(s): CARDIOVERSION (N/A)  Patient Location: Endoscopy Unit  Anesthesia Type:General  Level of Consciousness: awake, alert  and oriented  Airway & Oxygen Therapy: Patient Spontanous Breathing and Patient connected to nasal cannula oxygen  Post-op Assessment: Report given to PACU RN, Post -op Vital signs reviewed and stable and Patient moving all extremities  Post vital signs: Reviewed and stable  Complications: No apparent anesthesia complications

## 2014-03-20 NOTE — Telephone Encounter (Signed)
Pt had a cardioversion today. She called because she supposed to have a new prescription, but the medication was not called in to her pharmacy. On the CV procedure note Dr Kyla Balzarine Impression: "Failed [facility] will start Antiarrhythmic agent" no prescription was given to pt. Or called to her pharmacy. Pt is aware that this message will be send to Dr. Johnsie Cancel for recommendations.

## 2014-03-21 ENCOUNTER — Encounter (HOSPITAL_COMMUNITY): Payer: Self-pay | Admitting: Cardiovascular Disease

## 2014-03-21 MED ORDER — FLECAINIDE ACETATE 50 MG PO TABS: 50.0000 mg | ORAL_TABLET | Freq: Two times a day (BID) | ORAL | Status: DC

## 2014-03-21 NOTE — Telephone Encounter (Signed)
Follow Up  Pt called stating that the prescription has not been called in to pharmacy, please call back and discuss

## 2014-03-21 NOTE — Telephone Encounter (Signed)
Start flecainide 50 bid failed [facility] yesterday ETT 7-10 days after started with ECG  F/U with me next available

## 2014-03-21 NOTE — Telephone Encounter (Signed)
PT AWARE . WILL  FORWARD  MESSAGE TO  SCHEDULERS  TO  SET  UP   GXT/CY

## 2014-03-29 ENCOUNTER — Ambulatory Visit (HOSPITAL_COMMUNITY)
Admission: RE | Admit: 2014-03-29 | Discharge: 2014-03-29 | Disposition: A | Discharge status: Home / Self Care | Payer: Medicare Other | Source: Ambulatory Visit | Attending: Cardiovascular Disease | Admitting: Cardiovascular Disease

## 2014-03-29 DIAGNOSIS — Z79899 Other long term (current) drug therapy: Secondary | ICD-10-CM

## 2014-03-29 NOTE — Progress Notes (Signed)
Patient came to the hospital today for an exercise treadmill test after being started on flecainide.  For any significant amount of walking, she is using a wheelchair. She feels sure that she will not be able to walk well enough on a treadmill to get her heart rate up.  Canceled the treadmill and sent a message to Dr. Johnsie Cancel.  Rosaria Ferries, PA-C 03/29/2014 9:48 AM Beeper [phone]

## 2014-04-04 ENCOUNTER — Encounter: Payer: Self-pay | Admitting: Internal Medicine

## 2014-04-04 ENCOUNTER — Other Ambulatory Visit (INDEPENDENT_AMBULATORY_CARE_PROVIDER_SITE_OTHER): Payer: Medicare Other

## 2014-04-04 ENCOUNTER — Telehealth: Payer: Self-pay | Admitting: General Practice

## 2014-04-04 ENCOUNTER — Ambulatory Visit (INDEPENDENT_AMBULATORY_CARE_PROVIDER_SITE_OTHER): Payer: Medicare Other | Admitting: Internal Medicine

## 2014-04-04 VITALS — BP 134/90 | HR 83 | Temp 98.0°F | Ht 62.0 in | Wt 256.4 lb

## 2014-04-04 DIAGNOSIS — E119 Type 2 diabetes mellitus without complications: Secondary | ICD-10-CM

## 2014-04-04 DIAGNOSIS — R0602 Shortness of breath: Secondary | ICD-10-CM

## 2014-04-04 DIAGNOSIS — Z23 Encounter for immunization: Secondary | ICD-10-CM

## 2014-04-04 DIAGNOSIS — E785 Hyperlipidemia, unspecified: Secondary | ICD-10-CM

## 2014-04-04 DIAGNOSIS — I10 Essential (primary) hypertension: Secondary | ICD-10-CM

## 2014-04-04 LAB — BASIC METABOLIC PANEL
BUN: 12 mg/dL (ref 6–23)
CALCIUM: 8.2 mg/dL — AB (ref 8.4–10.5)
CO2: 29 mEq/L (ref 19–32)
CREATININE: 0.8 mg/dL (ref 0.4–1.2)
Chloride: 104 mEq/L (ref 96–112)
GFR: 78.57 mL/min (ref >60.00)
Glucose, Bld: 132 mg/dL — ABNORMAL HIGH (ref 70–99)
Potassium: 3.9 mEq/L (ref 3.5–5.1)
Sodium: 140 mEq/L (ref 135–145)

## 2014-04-04 LAB — HEPATIC FUNCTION PANEL
ALT: 23 U/L (ref 0–35)
AST: 21 U/L (ref 0–37)
Albumin: 3.3 g/dL — ABNORMAL LOW (ref 3.5–5.2)
Alkaline Phosphatase: 52 U/L (ref 39–117)
BILIRUBIN DIRECT: 0.2 mg/dL (ref 0.0–0.3)
BILIRUBIN TOTAL: 0.8 mg/dL (ref 0.2–1.2)
TOTAL PROTEIN: 5.7 g/dL — AB (ref 6.0–8.3)

## 2014-04-04 LAB — LIPID PANEL
Cholesterol: 110 mg/dL (ref 0–200)
HDL: 28.5 mg/dL — ABNORMAL LOW (ref >39.00)
LDL Cholesterol: 50 mg/dL (ref 0–99)
NonHDL: 81.5
Total CHOL/HDL Ratio: 4
Triglycerides: 160 mg/dL — ABNORMAL HIGH (ref 0.0–149.0)
VLDL: 32 mg/dL (ref 0.0–40.0)

## 2014-04-04 LAB — HEMOGLOBIN A1C: Hgb A1c MFr Bld: 6.3 % (ref 4.6–6.5)

## 2014-04-04 NOTE — Progress Notes (Signed)
Pre visit review using our clinic review tool, if applicable. No additional management support is needed unless otherwise documented below in the visit note. 

## 2014-04-04 NOTE — Progress Notes (Addendum)
Subjective:    Patient ID: Andrea Perry, female    DOB: [birth year], 63 y.o.   MRN: [id number]  HPI    Here to f/u; overall doing ok,  Pt denies chest pain, increased sob or doe, wheezing, orthopnea, PND, increased LE swelling, palpitations, dizziness or syncope.  Pt denies polydipsia, polyuria, or low sugar symptoms such as weakness or confusion improved with po intake.  Pt denies new neurological symptoms such as new headache, or facial or extremity weakness or numbness.   Pt states overall good compliance with meds, has been trying to follow lower cholesterol, diabetic diet, with wt overall stable,  but little exercise however.  Did have new afib with failed cardioversion, now on oral med, pt deferred her stress test from last wk (to be re-scheduled) due to worsening sob, but states she feels bronchitis/asthma/copd has been stable, seems to be more related to exertional sob/intolerance due to afib. Now on eliquis , doing well, recent INR 1.2, no overt bleeding or bruising.  Also left knee MRI aug 2015  - needs left knee TKR per Dr Yvonne Kendall but needs coord with specialists. Past Medical History  Diagnosis Date  . Impaired glucose tolerance 09/21/2010  . ALLERGIC RHINITIS 08/16/2008  . ANXIETY 08/16/2008  . ASTHMA 08/16/2008  . COPD 08/16/2008  . DEPRESSION 08/16/2008  . GENITAL HERPES 12/03/2006  . HEMATOCHEZIA 06/20/2009  . HYPERLIPIDEMIA 08/16/2008  . HYPERTENSION 12/03/2006  . IBS 06/20/2009    hx per patient, no current problem  . Hemorrhoids   . HYPOTHYROIDISM 12/03/2006    History only - no current problem  . GERD (gastroesophageal reflux disease)   . H/O hiatal hernia   . Arthritis     hands, knees, lower back  . 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 Appendectomy    . Knee surgery      Right   . Tonsillectomy and adenoidectomy    . Abdominal hysterectomy    . Carpal tunnel release      Right   . Cesarean section       x 1   . Colonoscopy  2004  . Cystocele repair N/A 10/25/2012    Procedure: ANTERIOR REPAIR (CYSTOCELE);  Surgeon: Gus Height, MD;  Location: [facility];  Service: Gynecology;  Laterality: N/A;  . Cardioversion N/A 03/20/2014    Procedure: CARDIOVERSION;  Surgeon: Josue Hector, MD;  Location: [facility]COPY;  Service: Cardiovascular;  Laterality: N/A;    reports that she quit smoking about 10 years ago. Her smoking use included Cigarettes. She has a 45 pack-year smoking history. She has never used smokeless tobacco. She reports that she does not drink alcohol or use illicit drugs. family history includes Colon polyps in her mother; Diabetes in her mother; Hyperlipidemia in her mother; Hypertension in her mother; Stroke in her father. There is no history of Colon cancer. Allergies  Allergen Reactions  . Codeine Nausea And Vomiting  . Tramadol Hcl Nausea And Vomiting    REACTION: mouth dryness, headache  . Vicodin [Hydrocodone-Acetaminophen] Nausea And Vomiting   Current Outpatient Prescriptions on File Prior to Visit  Medication Sig Dispense Refill  . acetaminophen (TYLENOL) 650 MG CR tablet Take 1,300 mg by mouth 2 (two) times daily.    Marland Kitchen apixaban (ELIQUIS) 5 MG TABS tablet Take 1 tablet (5 mg total) by mouth 2 (two) times daily. 60 tablet 6  . b complex  vitamins tablet Take 1 tablet by mouth daily.    Marland Kitchen BLACK COHOSH EXTRACT PO Take 40 mg by mouth daily.    . cetirizine (ZYRTEC) 10 MG tablet Take 10 mg by mouth daily.     . cholecalciferol (VITAMIN D) 1000 UNITS tablet Take 1,000 Units by mouth daily.    Mariane Baumgarten Calcium (STOOL SOFTENER PO) Take by mouth as directed.    . escitalopram (LEXAPRO) 10 MG tablet TAKE ONE TABLET BY MOUTH ONCE DAILY 90 tablet 3  . estradiol (ESTRACE) 2 MG tablet TAKE ONE TABLET BY MOUTH ONCE DAILY 90 tablet 3  . flecainide (TAMBOCOR) 50 MG tablet Take 1 tablet (50 mg total) by mouth 2 (two) times daily. 180 tablet 3  . Fluticasone-Salmeterol (ADVAIR DISKUS)  500-50 MCG/DOSE AEPB Inhale 1 puff into the lungs 2 (two) times daily. 60 each 11  . furosemide (LASIX) 40 MG tablet TAKE ONE TABLET BY MOUTH ONCE DAILY 90 tablet 1  . Garlic 4967 MG CAPS Take 1 capsule by mouth at bedtime.     . GuaiFENesin (MUCUS RELIEF ADULT PO) Take by mouth as directed.    . irbesartan (AVAPRO) 150 MG tablet Take 1 tablet (150 mg total) by mouth daily. 90 tablet 3  . Loperamide HCl (ANTI-DIARRHEAL PO) Take by mouth as directed.    Marland Kitchen LORazepam (ATIVAN) 1 MG tablet Take 1 tablet (1 mg total) by mouth at bedtime as needed for anxiety. 30 tablet 5  . metoprolol (LOPRESSOR) 50 MG tablet TAKE ONE and ONE  HALF TABLETS (1 AND 1/2) BY MOUTH TWICE DAILY    . Multiple Vitamin (MULTIVITAMIN) tablet Take 1 tablet by mouth daily.      . Omega-3 Fatty Acids (FISH OIL) 1200 MG CAPS Take 1 capsule by mouth daily.     . pantoprazole (PROTONIX) 40 MG tablet TAKE ONE TABLET BY MOUTH ONCE DAILY 90 tablet 1  . potassium gluconate 595 MG TABS Take 595 mg by mouth every other day.    . pravastatin (PRAVACHOL) 40 MG tablet TAKE ONE TABLET BY MOUTH ONCE DAILY 90 tablet 3  . vitamin C (ASCORBIC ACID) 500 MG tablet Take 500 mg by mouth daily as needed (for cold symptoms).    . vitamin E 400 UNIT capsule Take 400 Units by mouth daily.     No current facility-administered medications on file prior to visit.   Review of Systems   Constitutional: Negative for unusual diaphoresis or other sweats  HENT: Negative for ringing in ear Eyes: Negative for double vision or worsening visual disturbance.  Respiratory: Negative for choking and stridor.   Gastrointestinal: Negative for vomiting or other signifcant bowel change Genitourinary: Negative for hematuria or decreased urine volume.  Musculoskeletal: Negative for other MSK pain or swelling Skin: Negative for color change and worsening wound.  Neurological: Negative for tremors and numbness other than noted  Psychiatric/Behavioral: Negative for  decreased concentration or agitation other than above       Objective:   Physical Exam BP 134/90 mmHg  Pulse 83  Temp(Src) 98 F (36.7 C) (Oral)  Ht 5\' 2"  (1.575 m)  Wt 256 lb 6 oz (116.291 kg)  BMI 46.88 kg/m2  SpO2 95% VS noted,  Constitutional: Pt appears well-developed, well-nourished.  HENT: Head: NCAT.  Right Ear: External ear normal.  Left Ear: External ear normal.  Eyes: . Pupils are equal, round, and reactive to light. Conjunctivae and EOM are normal Neck: Normal range of motion. Neck supple.  Cardiovascular: Normal rate  and irregular rhythm.   Pulmonary/Chest: Effort normal and breath sounds normal.  Abd:  Soft, NT, ND, + BS Neurological: Pt is alert. Not confused , motor grossly intact Skin: Skin is warm. No rash, has trace left > right ankle edema Psychiatric: Pt behavior is normal. No agitation.         Assessment & Plan:

## 2014-04-04 NOTE — Addendum Note (Signed)
Addended by: Sharon Seller B on: 04/04/2014 11:47 AM   Modules accepted: Orders

## 2014-04-04 NOTE — Telephone Encounter (Signed)
PT  DID NOT  HAVE  GXT DONE  AS  ORDERED PER  PT NOT ABLE TO WALK ON TREADMILL  WAS  SUPPOSE  TO HAVE THIS  DONE  AFTER  STARTING  FLECAINIDE  NOT SURE  IF  HAVING  NUCLEAR  STUDY  IS  NECESSARY   PER PT  STILL  HAS  SOB  AND   AND ELEVATED HEART RATE  WILL FORWARD TO DR Johnsie Cancel FOR  REVIEW .Adonis Housekeeper

## 2014-04-04 NOTE — Assessment & Plan Note (Signed)
New onset, diet for now, difficult wt control due to endstage rigth knee, for f/u labs, consider metformin Lab Results  Component Value Date   HGBA1C 6.7* 10/03/2013

## 2014-04-04 NOTE — Assessment & Plan Note (Signed)
Goal ldl < 70, for lipid f/u, cont same tx for now Lab Results  Component Value Date   LDLCALC 71 10/03/2013

## 2014-04-04 NOTE — Telephone Encounter (Signed)
New Message      Patient would like to be set up for a nuclear stress test.

## 2014-04-04 NOTE — Patient Instructions (Addendum)
You had the Pneumovax today (with the second and last shot due in 5 years)  Please continue all other medications as before, and refills have been done if requested.  Please have the pharmacy call with any other refills you may need.  Please continue your efforts at being more active, low cholesterol diet, and weight control.  You are otherwise up to date with prevention measures today.  Please keep your appointments with your specialists as you may have planned  Please go to the LAB in the Basement (turn left off the elevator) for the tests to be done today  You will be contacted by phone if any changes need to be made immediately.  Otherwise, you will receive a letter about your results with an explanation, but please check with MyChart first.  Please remember to sign up for MyChart if you have not done so, as this will be important to you in the future with finding out test results, communicating by private email, and scheduling acute appointments online when needed.  Please return in 6 months, or sooner if needed

## 2014-04-05 NOTE — Telephone Encounter (Signed)
Needs myovue to r/o CAD especially if on flecainide  Continue metoprolol  Some of her dyspnea is from lung disease EF is normal

## 2014-04-06 NOTE — Telephone Encounter (Signed)
SPOKE  WITH  PT  SOB   NOT  ANY  WORSE THAN  BEFORE  IN FORMED  PT   TO  CONTACT   PULMONARY  AS   EF  IS  NORMAL AND ALL SOB  MAY NOT BE RELATED  TO HEART  BUT LUNGS  PT VERBALIZED UNDERSTANDING .Adonis Housekeeper

## 2014-04-06 NOTE — Telephone Encounter (Signed)
Pt c/o Shortness Of Breath: STAT if SOB developed within the last 24 hours or pt is noticeably SOB on the phone  1. Are you currently SOB (can you hear that pt is SOB on the phone)? No 2. How long have you been experiencing SOB? Before she was put on medication 3. Are you SOB when sitting or when up moving around? Moving around 4.  Are you currently experiencing any other symptoms? No   Pt need to know if she need to adjust her meds.

## 2014-04-06 NOTE — Addendum Note (Signed)
Addended by: Devra Dopp E on: 04/06/2014 08:12 AM   Modules accepted: Orders

## 2014-04-06 NOTE — Telephone Encounter (Signed)
LMTCB ./CY 

## 2014-04-06 NOTE — Telephone Encounter (Signed)
MYOVIEW SCHEDULED  PER PT . SEE   APPTS./CY

## 2014-04-15 NOTE — Progress Notes (Signed)
Patient ID: Andrea Perry, female   DOB: 01/04/1951, 63 y.o.   MRN: [id number] 63 yo referred by Dr Noemi Chapel for preop clearance 11/15   She has significant arthritis of both knees. Previous left knee surgery 1.5 years ago with no complications. She has severe COPD. Primary is Dr Jenny Reichmann. No documented CAD. She described chest pain intermitant not always exertional Central with radiation to left. CRF HTN and elevated lipids on Rx She was seen by pulmonary and started on inhalers Dr Lamonte Sakai felt moderate risk for surgery FEV1 59% predicted no desats Also felt deconditioning had some to do with her dyspnea. Today she was noted to be in rapid afib. No history of but does get palpitations No contraindications to anticoagulation Her knee surgery is elective She has noted increasing dyspnea over the last 6-8 months No cough , mild wheezing   Echo 10/26 ok   Study Conclusions  - Left ventricle: Inferobasal hypokinesis. The cavity size was normal. Wall thickness was normal. Systolic function was normal. The estimated ejection fraction was in the range of 50% to 55%. - Left atrium: The atrium was mildly dilated. - Atrial septum: No defect or patent foramen ovale was identified. - Pulmonary arteries: PA peak pressure: 41 mm Hg (S).   She had more joint pain and xarelto changed to eliquis. Failed [place] on 03/20/14 started on flecainide Unable to do ETT due to arthritis Scheduled for lexiscan  04/23/13     ROS: Denies fever, malais, weight loss, blurry vision, decreased visual acuity, cough, sputum, SOB, hemoptysis, pleuritic pain, palpitaitons, heartburn, abdominal pain, melena, lower extremity edema, claudication, or rash.  All other systems reviewed and negative  General: Affect appropriate Healthy:  appears stated age 89: normal Neck supple with no adenopathy JVP normal no bruits no thyromegaly Lungs clear with no wheezing and good diaphragmatic motion Heart:  S1/S2 no murmur, no rub,  gallop or click PMI normal Abdomen: benighn, BS positve, no tenderness, no AAA no bruit.  No HSM or HJR Distal pulses intact with no bruits No edema Neuro non-focal Skin warm and dry No muscular weakness   Current Outpatient Prescriptions  Medication Sig Dispense Refill  . acetaminophen (TYLENOL) 650 MG CR tablet Take 1,300 mg by mouth 2 (two) times daily.    Marland Kitchen apixaban (ELIQUIS) 5 MG TABS tablet Take 1 tablet (5 mg total) by mouth 2 (two) times daily. 60 tablet 6  . b complex vitamins tablet Take 1 tablet by mouth daily.    Marland Kitchen BLACK COHOSH EXTRACT PO Take 40 mg by mouth daily.    . cetirizine (ZYRTEC) 10 MG tablet Take 10 mg by mouth daily.     . cholecalciferol (VITAMIN D) 1000 UNITS tablet Take 1,000 Units by mouth daily.    Mariane Baumgarten Calcium (STOOL SOFTENER PO) Take by mouth as directed.    . escitalopram (LEXAPRO) 10 MG tablet TAKE ONE TABLET BY MOUTH ONCE DAILY 90 tablet 3  . estradiol (ESTRACE) 2 MG tablet TAKE ONE TABLET BY MOUTH ONCE DAILY 90 tablet 3  . flecainide (TAMBOCOR) 50 MG tablet Take 1 tablet (50 mg total) by mouth 2 (two) times daily. 180 tablet 3  . Fluticasone-Salmeterol (ADVAIR DISKUS) 500-50 MCG/DOSE AEPB Inhale 1 puff into the lungs 2 (two) times daily. 60 each 11  . furosemide (LASIX) 40 MG tablet TAKE ONE TABLET BY MOUTH ONCE DAILY 90 tablet 1  . Garlic 5027 MG CAPS Take 1 capsule by mouth at bedtime.     Marland Kitchen  GuaiFENesin (MUCUS RELIEF ADULT PO) Take by mouth as directed.    . irbesartan (AVAPRO) 150 MG tablet Take 1 tablet (150 mg total) by mouth daily. 90 tablet 3  . Loperamide HCl (ANTI-DIARRHEAL PO) Take by mouth as directed.    Marland Kitchen LORazepam (ATIVAN) 1 MG tablet Take 1 tablet (1 mg total) by mouth at bedtime as needed for anxiety. 30 tablet 5  . metoprolol (LOPRESSOR) 50 MG tablet TAKE ONE and ONE  HALF TABLETS (1 AND 1/2) BY MOUTH TWICE DAILY    . Multiple Vitamin (MULTIVITAMIN) tablet Take 1 tablet by mouth daily.      . Omega-3 Fatty Acids (FISH OIL) 1200  MG CAPS Take 1 capsule by mouth daily.     . pantoprazole (PROTONIX) 40 MG tablet TAKE ONE TABLET BY MOUTH ONCE DAILY 90 tablet 1  . potassium gluconate 595 MG TABS Take 595 mg by mouth every other day.    . pravastatin (PRAVACHOL) 40 MG tablet TAKE ONE TABLET BY MOUTH ONCE DAILY 90 tablet 3  . vitamin C (ASCORBIC ACID) 500 MG tablet Take 500 mg by mouth daily as needed (for cold symptoms).    . vitamin E 400 UNIT capsule Take 400 Units by mouth daily.     No current facility-administered medications for this visit.    Allergies  Codeine; Tramadol hcl; and Vicodin  Electrocardiogram: afib nonspecific ST/T wave changes 03/20/14  Today afib rate 96 nonspecific ST changes no change from prevoius   Assessment and Plan

## 2014-04-17 ENCOUNTER — Ambulatory Visit (INDEPENDENT_AMBULATORY_CARE_PROVIDER_SITE_OTHER): Payer: Medicare Other | Admitting: Cardiovascular Disease

## 2014-04-17 ENCOUNTER — Encounter: Payer: Self-pay | Admitting: Cardiovascular Disease

## 2014-04-17 VITALS — BP 126/76 | HR 96 | Ht 62.0 in | Wt 248.0 lb

## 2014-04-17 DIAGNOSIS — I10 Essential (primary) hypertension: Secondary | ICD-10-CM

## 2014-04-17 DIAGNOSIS — E785 Hyperlipidemia, unspecified: Secondary | ICD-10-CM

## 2014-04-17 NOTE — Assessment & Plan Note (Signed)
Well controlled.  Continue current medications and low sodium [place] type diet.    

## 2014-04-17 NOTE — Assessment & Plan Note (Signed)
Cholesterol is at goal.  Continue current dose of statin and diet Rx.  No myalgias or side effects.  F/U  LFT's in 6 months. Lab Results  Component Value Date   LDLCALC 50 04/04/2014

## 2014-04-17 NOTE — Assessment & Plan Note (Signed)
Failed [place] Continue eliquis started on flecainide  F/U Lexiscan myovue r/o CAD  F/u next available and consider repeat [facility] on antiarrhythmic agent

## 2014-04-17 NOTE — Patient Instructions (Signed)
Your physician recommends that you schedule a follow-up appointment in: NEXT  AVAILABLE WITH  DR NISHAN  Your physician recommends that you continue on your current medications as directed. Please refer to the Current Medication list given to you today. 

## 2014-04-23 ENCOUNTER — Ambulatory Visit (HOSPITAL_COMMUNITY): Payer: Medicare Other | Attending: Cardiovascular Disease | Admitting: Radiology

## 2014-04-23 DIAGNOSIS — R0602 Shortness of breath: Secondary | ICD-10-CM

## 2014-04-23 DIAGNOSIS — R079 Chest pain, unspecified: Secondary | ICD-10-CM

## 2014-04-23 MED ORDER — TECHNETIUM TC 99M SESTAMIBI GENERIC - CARDIOLITE
30.0000 | Freq: Once | INTRAVENOUS | Status: AC | PRN
  Administered 2014-04-23: 30 via INTRAVENOUS

## 2014-04-23 MED ORDER — REGADENOSON 0.4 MG/5ML IV SOLN
0.4000 mg | Freq: Once | INTRAVENOUS | Status: AC
  Administered 2014-04-23: 0.4 mg via INTRAVENOUS

## 2014-04-23 NOTE — Progress Notes (Signed)
[facility] [address] [place], [place] [address] [phone]    Cardiology Nuclear Med Study  CADI RHINEHART is a 64 y.o. female     MRN : [id number]     DOB: 08-22-1950  Procedure Date: 04/23/2014  Nuclear Med Background Indication for Stress Test:  Evaluation for Ischemia and Surgical Clearance:Knee Surgery with Dr. Noemi Chapel History:  No known CAD, Asthma, COPD, Afib Cardiac Risk Factors: Hypertension  Symptoms:  Chest Pain, Chest Pain with Exertion and Palpitations   Nuclear Pre-Procedure Caffeine/Decaff Intake:  8:00pm NPO After: 11:30pm   Lungs:  clear O2 Sat: 96% on room air. IV 0.9% NS with Angio Cath:  22g  IV Site: R Hand  IV Started by:  Matilde Haymaker, RN  Chest Size (in):  44 Cup Size: D  Height: 5\' 2"  (1.575 m)  Weight:  247 lb (112.038 kg)  BMI:  Body mass index is 45.17 kg/(m^2). Tech Comments:  n/a    Nuclear Med Study 1 or 2 day study: 2 day  Stress Test Type:  Lexiscan  Reading MD: n/a  Order Authorizing Provider:  Verlin Dike  Resting Radionuclide: Technetium 56m Sestamibi  Resting Radionuclide Dose: 33.0 mCi  On      04-24-14  Stress Radionuclide:  Technetium 7m Sestamibi  Stress Radionuclide Dose: 33.0 mCi  On        04-23-14          Stress Protocol Rest HR: 107 Stress HR: 116  Rest BP: 133/88 Stress BP: 140/88  Exercise Time (min): n/a METS: n/a   Predicted Max HR: 159 bpm % Max HR: 72.96 bpm Rate Pressure Product: 16704   Dose of Adenosine (mg):  n/a Dose of Lexiscan: 0.4 mg  Dose of Atropine (mg): n/a Dose of Dobutamine: n/a mcg/kg/min (at max HR)  Stress Test Technologist: Glade Lloyd, BS-ES  Nuclear Technologist:  Earl Many, CNMT     Rest Procedure:  Myocardial perfusion imaging was performed at rest 45 minutes following the intravenous administration of Technetium 66m Sestamibi. Rest ECG: Atrial Fibrilliation  Stress Procedure:  The patient received IV Lexiscan 0.4 mg over 15-seconds.   Technetium 57m Sestamibi injected at 30-seconds.  Quantitative spect images were obtained after a 45 minute delay.  During the infusion of Lexiscan the patient complained of SOB and feeling flushed.  These symptoms resolved in recovery.  Stress ECG: No significant change from baseline ECG  QPS Raw Data Images:  There is a breast shadow that accounts for the anterior attenuation. Stress Images:  There is decreased uptake in the anterior wall. Rest Images:  There is decreased uptake in the anterior wall. Subtraction (SDS):  There is a fixed anteriour defect that is most consistent with breast attenuation. Transient Ischemic Dilatation (Normal <1.22):  0.94 Lung/Heart Ratio (Normal <0.45):  0.33  Quantitative Gated Spect Images QGS EDV:  89 ml QGS ESV:  50 ml  Impression Exercise Capacity:  Lexiscan with no exercise. BP Response:  Normal blood pressure response. Clinical Symptoms:  No significant symptoms noted. ECG Impression:  No significant ECG changes with Lexiscan. Comparison with Prior Nuclear Study: No previous nuclear study performed  Overall Impression:  Low risk stress nuclear study with small size and mild intensity fixed distal anteroapical breast attenuation artifact.  LV Ejection Fraction: 44%.  LV Wall Motion:  Mild global hypokinesis   Pixie Casino, MD, [facility] Board Certified in Nuclear Cardiology Attending Cardiologist [place]

## 2014-04-24 ENCOUNTER — Ambulatory Visit (HOSPITAL_COMMUNITY): Payer: Medicare Other | Attending: Cardiovascular Disease

## 2014-04-24 ENCOUNTER — Ambulatory Visit: Payer: Medicare Other | Admitting: Cardiovascular Disease

## 2014-04-24 DIAGNOSIS — R0989 Other specified symptoms and signs involving the circulatory and respiratory systems: Secondary | ICD-10-CM

## 2014-04-24 MED ORDER — TECHNETIUM TC 99M SESTAMIBI GENERIC - CARDIOLITE
30.0000 | Freq: Once | INTRAVENOUS | Status: AC | PRN
  Administered 2014-04-24: 30 via INTRAVENOUS

## 2014-04-30 ENCOUNTER — Telehealth: Payer: Self-pay | Admitting: Cardiovascular Disease

## 2014-04-30 NOTE — Telephone Encounter (Signed)
New Msg        Pt calling to see if results from Nuclear are avail.     Please return pt call.

## 2014-04-30 NOTE — Telephone Encounter (Signed)
Patient informed. Voiced good understanding.

## 2014-05-26 ENCOUNTER — Other Ambulatory Visit: Payer: Self-pay | Admitting: Internal Medicine

## 2014-05-29 NOTE — Telephone Encounter (Signed)
Done hardcopy to cindy  

## 2014-05-31 ENCOUNTER — Telehealth: Payer: Self-pay | Admitting: Internal Medicine

## 2014-05-31 NOTE — Telephone Encounter (Signed)
Very sorry, but office policy is that pt's normally need an OV if it seems antibx might be needed; the reason is to minimize antibiotic resistance due to overuse

## 2014-05-31 NOTE — Telephone Encounter (Signed)
Patient is experiencing symptoms of bronchitis, and is asking if we could call something in for her. She states that you are aware of her issues and she shouldn't need to be seen.

## 2014-05-31 NOTE — Telephone Encounter (Signed)
Spoke to patient, scheduled for tomorrow

## 2014-06-01 ENCOUNTER — Encounter: Payer: Self-pay | Admitting: Internal Medicine

## 2014-06-01 ENCOUNTER — Ambulatory Visit (INDEPENDENT_AMBULATORY_CARE_PROVIDER_SITE_OTHER)
Admission: RE | Admit: 2014-06-01 | Discharge: 2014-06-01 | Disposition: A | Discharge status: Home / Self Care | Payer: Medicare Other | Source: Ambulatory Visit | Attending: Internal Medicine | Admitting: Internal Medicine

## 2014-06-01 ENCOUNTER — Ambulatory Visit (INDEPENDENT_AMBULATORY_CARE_PROVIDER_SITE_OTHER): Payer: Medicare Other | Admitting: Internal Medicine

## 2014-06-01 VITALS — BP 122/90 | HR 101 | Temp 98.2°F | Ht 62.0 in | Wt 247.0 lb

## 2014-06-01 DIAGNOSIS — J209 Acute bronchitis, unspecified: Secondary | ICD-10-CM

## 2014-06-01 DIAGNOSIS — J441 Chronic obstructive pulmonary disease with (acute) exacerbation: Secondary | ICD-10-CM

## 2014-06-01 DIAGNOSIS — I10 Essential (primary) hypertension: Secondary | ICD-10-CM

## 2014-06-01 MED ORDER — PREDNISONE 10 MG PO TABS: ORAL_TABLET | ORAL | Status: DC

## 2014-06-01 MED ORDER — LEVOFLOXACIN 250 MG PO TABS: 250.0000 mg | ORAL_TABLET | Freq: Every day | ORAL | Status: DC

## 2014-06-01 MED ORDER — BENZONATATE 100 MG PO CAPS
ORAL_CAPSULE | ORAL | Status: DC
Start: 2014-06-01 — End: 2014-11-19

## 2014-06-01 MED ORDER — METHYLPREDNISOLONE ACETATE 80 MG/ML IJ SUSP
80.0000 mg | Freq: Once | INTRAMUSCULAR | Status: AC
Start: 2014-06-01 — End: 2014-06-01
  Administered 2014-06-01: 80 mg via INTRAMUSCULAR

## 2014-06-01 MED ORDER — CEFTRIAXONE SODIUM 1 G IJ SOLR
1.0000 g | Freq: Once | INTRAMUSCULAR | Status: AC
  Administered 2014-06-01: 1 g via INTRAMUSCULAR

## 2014-06-01 NOTE — Progress Notes (Signed)
Pre visit review using our clinic review tool, if applicable. No additional management support is needed unless otherwise documented below in the visit note. 

## 2014-06-01 NOTE — Patient Instructions (Signed)
.  You had the steroid shot today, and the antibiotic shot  Please take all new medication as prescribed - the antibiotic pills, cough pills, and prednisone  Please continue all other medications as before, including your inhalers  Please have the pharmacy call with any other refills you may need.  Please keep your appointments with your specialists as you may have planned  .Please go to the XRAY Department in the Basement (go straight as you get off the elevator) for the x-ray testing  You will be contacted by phone if any changes need to be made immediately.  Otherwise, you will receive a letter about your results with an explanation, but please check with MyChart first.  Please remember to sign up for MyChart if you have not done so, as this will be important to you in the future with finding out test results, communicating by private email, and scheduling acute appointments online when needed.

## 2014-06-02 NOTE — Progress Notes (Signed)
Subjective:    Patient ID: Andrea Perry, female    DOB: 12-Jun-1950, 64 y.o.   MRN: [id number]  HPI  Here with acute onset mild to mod 2-3 days ST, HA, general weakness and malaise, with prod cough greenish sputum, but Pt denies chest pain, increased sob or doe, wheezing, orthopnea, PND, increased LE swelling, palpitations, dizziness or syncope, except for onset mild wheezing/sob last PM. Pt denies new neurological symptoms such as new headache, or facial or extremity weakness or numbness Past Medical History  Diagnosis Date  . Impaired glucose tolerance 09/21/2010  . ALLERGIC RHINITIS 08/16/2008  . ANXIETY 08/16/2008  . ASTHMA 08/16/2008  . COPD 08/16/2008  . DEPRESSION 08/16/2008  . GENITAL HERPES 12/03/2006  . HEMATOCHEZIA 06/20/2009  . HYPERLIPIDEMIA 08/16/2008  . HYPERTENSION 12/03/2006  . IBS 06/20/2009    hx per patient, no current problem  . Hemorrhoids   . HYPOTHYROIDISM 12/03/2006    History only - no current problem  . GERD (gastroesophageal reflux disease)   . H/O hiatal hernia   . Arthritis     hands, knees, lower back  . 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 Appendectomy    . Knee surgery      Right   . Tonsillectomy and adenoidectomy    . Abdominal hysterectomy    . Carpal tunnel release      Right   . Cesarean section      x 1   . Colonoscopy  2004  . Cystocele repair N/A 10/25/2012    Procedure: ANTERIOR REPAIR (CYSTOCELE);  Surgeon: Gus Height, MD;  Location: [facility];  Service: Gynecology;  Laterality: N/A;  . Cardioversion N/A 03/20/2014    Procedure: CARDIOVERSION;  Surgeon: Josue Hector, MD;  Location: [facility];  Service: Cardiovascular;  Laterality: N/A;    reports that she quit smoking about 11 years ago. Her smoking use included Cigarettes. She has a 45 pack-year smoking history. She has never used smokeless tobacco. She reports that she does not drink alcohol or use illicit drugs. family  history includes Colon polyps in her mother; Diabetes in her mother; Hyperlipidemia in her mother; Hypertension in her mother; Stroke in her father. There is no history of Colon cancer. Allergies  Allergen Reactions  . Codeine Nausea And Vomiting  . Tramadol Hcl Nausea And Vomiting    REACTION: mouth dryness, headache  . Vicodin [Hydrocodone-Acetaminophen] Nausea And Vomiting   Current Outpatient Prescriptions on File Prior to Visit  Medication Sig Dispense Refill  . acetaminophen (TYLENOL) 650 MG CR tablet Take 1,300 mg by mouth 2 (two) times daily.    Marland Kitchen apixaban (ELIQUIS) 5 MG TABS tablet Take 1 tablet (5 mg total) by mouth 2 (two) times daily. 60 tablet 6  . b complex vitamins tablet Take 1 tablet by mouth daily.    Marland Kitchen BLACK COHOSH EXTRACT PO Take 40 mg by mouth daily.    . cetirizine (ZYRTEC) 10 MG tablet Take 10 mg by mouth daily.     . cholecalciferol (VITAMIN D) 1000 UNITS tablet Take 1,000 Units by mouth daily.    Andrea Perry Calcium (STOOL SOFTENER PO) Take by mouth as directed.    . escitalopram (LEXAPRO) 10 MG tablet TAKE ONE TABLET BY MOUTH ONCE DAILY 90 tablet 3  . estradiol (ESTRACE) 2 MG tablet TAKE ONE TABLET BY MOUTH ONCE DAILY 90 tablet 3  . flecainide (TAMBOCOR)  50 MG tablet Take 1 tablet (50 mg total) by mouth 2 (two) times daily. 180 tablet 3  . Fluticasone-Salmeterol (ADVAIR DISKUS) 500-50 MCG/DOSE AEPB Inhale 1 puff into the lungs 2 (two) times daily. 60 each 11  . furosemide (LASIX) 40 MG tablet TAKE ONE TABLET BY MOUTH ONCE DAILY 90 tablet 1  . Garlic 3545 MG CAPS Take 1 capsule by mouth at bedtime.     . GuaiFENesin (MUCUS RELIEF ADULT PO) Take by mouth as directed.    . irbesartan (AVAPRO) 150 MG tablet Take 1 tablet (150 mg total) by mouth daily. 90 tablet 3  . Loperamide HCl (ANTI-DIARRHEAL PO) Take by mouth as directed.    Marland Kitchen LORazepam (ATIVAN) 1 MG tablet TAKE ONE TABLET BY MOUTH AT BEDTIME AS NEEDED FOR ANXIETY 30 tablet 5  . metoprolol (LOPRESSOR) 50 MG tablet  TAKE ONE and ONE  HALF TABLETS (1 AND 1/2) BY MOUTH TWICE DAILY    . Multiple Vitamin (MULTIVITAMIN) tablet Take 1 tablet by mouth daily.      . Omega-3 Fatty Acids (FISH OIL) 1200 MG CAPS Take 1 capsule by mouth daily.     . pantoprazole (PROTONIX) 40 MG tablet TAKE ONE TABLET BY MOUTH ONCE DAILY 90 tablet 1  . potassium gluconate 595 MG TABS Take 595 mg by mouth every other day.    . pravastatin (PRAVACHOL) 40 MG tablet TAKE ONE TABLET BY MOUTH ONCE DAILY 90 tablet 3  . vitamin C (ASCORBIC ACID) 500 MG tablet Take 500 mg by mouth daily as needed (for cold symptoms).    . vitamin E 400 UNIT capsule Take 400 Units by mouth daily.     No current facility-administered medications on file prior to visit.   Review of Systems  Constitutional: Negative for unusual diaphoresis or other sweats  HENT: Negative for ringing in ear Eyes: Negative for double vision or worsening visual disturbance.  Respiratory: Negative for choking and stridor.   Gastrointestinal: Negative for vomiting or other signifcant bowel change Genitourinary: Negative for hematuria or decreased urine volume.  Musculoskeletal: Negative for other MSK pain or swelling Skin: Negative for color change and worsening wound.  Neurological: Negative for tremors and numbness other than noted  Psychiatric/Behavioral: Negative for decreased concentration or agitation other than above       Objective:   Physical Exam BP 122/90 mmHg  Pulse 101  Temp(Src) 98.2 F (36.8 C) (Oral)  Ht 5\' 2"  (1.575 m)  Wt 247 lb (112.038 kg)  BMI 45.17 kg/m2  SpO2 97% VS noted, mild ill Constitutional: Pt appears well-developed, well-nourished. Andrea Perry HENT: Head: NCAT.  Right Ear: External ear normal.  Left Ear: External ear normal.  Eyes: . Pupils are equal, round, and reactive to light. Conjunctivae and EOM are normal Bilat tm's with mild erythema.  Max sinus areas non tender.  Pharynx with mild erythema, no exudate Neck: Normal range of motion.  Neck supple.  Cardiovascular: Normal rate and regular rhythm.   Pulmonary/Chest: Effort normal and breath sounds decreased without rales but +bilat mild wheezing.  Neurological: Pt is alert. Not confused , motor grossly intact Skin: Skin is warm. No rash Psychiatric: Pt behavior is normal. No agitation.     Assessment & Plan:

## 2014-06-02 NOTE — Assessment & Plan Note (Addendum)
Mild to mod, for antibx course after rocephin IM today,  to f/u any worsening symptoms or concerns, cant r/o pna - for cxr

## 2014-06-02 NOTE — Assessment & Plan Note (Signed)
Mild to mod, for depomedrol IM, predpac asd, to f/u any worsening symptoms or concerns 

## 2014-06-02 NOTE — Assessment & Plan Note (Signed)
stable overall by history and exam, recent data reviewed with pt, and pt to continue medical treatment as before,  to f/u any worsening symptoms or concerns BP Readings from Last 3 Encounters:  06/01/14 122/90  04/23/14 133/88  04/17/14 126/76

## 2014-06-05 ENCOUNTER — Encounter: Payer: Self-pay | Admitting: Internal Medicine

## 2014-06-06 ENCOUNTER — Telehealth: Payer: Self-pay | Admitting: *Deleted

## 2014-06-06 NOTE — Telephone Encounter (Signed)
[place] Night - Client TELEPHONE ADVICE RECORD [facility] Medical Call Center Patient Name: Andrea Perry Gender: Female DOB: Sep 05, 1950 Age: 64 Y 4 M 28 D Return Phone Number: [phone] (Primary) Address: City/State/Zip: [place] [address] Client [place] Night - Client Client [place] - Night Physician Cathlean Cower Contact Type Call Call Type Triage / Clinical Relationship To Patient Self Return Phone Number [phone] (Primary) Chief Complaint Cough Initial Comment Caller states was DX w/ pneumonia, the ABX that was called in gave her a reaction the last time she took it. needs to be on ABX Nurse Assessment Nurse: Malva Cogan, RN, Juliann Pulse Date/Time Eilene Ghazi Time): 06/02/2014 1:32:44 PM Confirm and document reason for call. If symptomatic, describe symptoms. ---Caller states that she was diagnosed as having pneumonia when examined yesterday, was prescribed unknown antibiotic that caller states pharmacy has advised her that she has had a previous reaction to in the past but caller was unaware of ay reaction in past to anything other than certain pain meds. Caller advised that she needs to call the pharmacy & find out what med she was prescribed yesterday & when & what type of reaction she had in the past according to pharmacy before triager can contact on call provider as triager does not have access to her records after hrs. Advised to call to call back to same number that she originally dialed when she has this info so that triager can go from there. Has the patient traveled out of the country within the last 30 days? ---No Does the patient require triage? ---No Please document clinical information provided and list any resource used. ---See previous note. Guidelines Guideline Title Affirmed Question Affirmed Notes Nurse Date/Time (Eastern Time) Disp. Time Eilene Ghazi Time) Disposition Final User 06/02/2014 1:32:35 PM  Clinical Call Yes Malva Cogan, RN, Juliann Pulse After Care Instructions Given Call Event Type User Date / Time Description

## 2014-06-26 ENCOUNTER — Ambulatory Visit (INDEPENDENT_AMBULATORY_CARE_PROVIDER_SITE_OTHER): Payer: Medicare Other | Admitting: Internal Medicine

## 2014-06-26 ENCOUNTER — Encounter: Payer: Self-pay | Admitting: Internal Medicine

## 2014-06-26 VITALS — BP 134/90 | HR 87 | Temp 98.9°F | Resp 18 | Ht 62.0 in | Wt 242.1 lb

## 2014-06-26 DIAGNOSIS — J45901 Unspecified asthma with (acute) exacerbation: Secondary | ICD-10-CM | POA: Insufficient documentation

## 2014-06-26 DIAGNOSIS — J4521 Mild intermittent asthma with (acute) exacerbation: Secondary | ICD-10-CM

## 2014-06-26 DIAGNOSIS — J309 Allergic rhinitis, unspecified: Secondary | ICD-10-CM

## 2014-06-26 DIAGNOSIS — E119 Type 2 diabetes mellitus without complications: Secondary | ICD-10-CM

## 2014-06-26 DIAGNOSIS — I10 Essential (primary) hypertension: Secondary | ICD-10-CM

## 2014-06-26 MED ORDER — FUROSEMIDE 40 MG PO TABS: 40.0000 mg | ORAL_TABLET | Freq: Every day | ORAL | Status: DC

## 2014-06-26 MED ORDER — PREDNISONE 10 MG PO TABS: ORAL_TABLET | ORAL | Status: DC

## 2014-06-26 MED ORDER — METHYLPREDNISOLONE ACETATE 80 MG/ML IJ SUSP
80.0000 mg | Freq: Once | INTRAMUSCULAR | Status: AC
  Administered 2014-06-26: 80 mg via INTRAMUSCULAR

## 2014-06-26 NOTE — Assessment & Plan Note (Signed)
Mild to mod, for depomedrol IM, predpack asd,  to f/u any worsening symptoms or concerns 

## 2014-06-26 NOTE — Progress Notes (Signed)
Pre visit review using our clinic review tool, if applicable. No additional management support is needed unless otherwise documented below in the visit note. 

## 2014-06-26 NOTE — Assessment & Plan Note (Signed)
stable overall by history and exam, recent data reviewed with pt, and pt to continue medical treatment as before,  to f/u any worsening symptoms or concerns BP Readings from Last 3 Encounters:  06/26/14 134/90  06/01/14 122/90  04/23/14 133/88

## 2014-06-26 NOTE — Progress Notes (Signed)
Subjective:    Patient ID: Andrea Perry, female    DOB: 12/14/1950, 64 y.o.   MRN: [id number]  HPI   Here to f/u; overall doing ok,  Pt denies chest pain, orthopnea, PND, increased LE swelling, palpitations, dizziness or syncope, except for mild worse wheezing/sob intermittent in the past wk. Does have several wks ongoing nasal allergy symptoms with clearish congestion, itch and sneezing, without fever, pain, ST, cough, swelling..  Pt denies new neurological symptoms such as new headache, or facial or extremity weakness or numbness.  Pt denies polydipsia, polyuria, or low sugar episode.   Pt denies new neurological symptoms such as new headache, or facial or extremity weakness or numbness.   Pt states overall good compliance with meds,  Past Medical History  Diagnosis Date  . Impaired glucose tolerance 09/21/2010  . ALLERGIC RHINITIS 08/16/2008  . ANXIETY 08/16/2008  . ASTHMA 08/16/2008  . COPD 08/16/2008  . DEPRESSION 08/16/2008  . GENITAL HERPES 12/03/2006  . HEMATOCHEZIA 06/20/2009  . HYPERLIPIDEMIA 08/16/2008  . HYPERTENSION 12/03/2006  . IBS 06/20/2009    hx per patient, no current problem  . Hemorrhoids   . HYPOTHYROIDISM 12/03/2006    History only - no current problem  . GERD (gastroesophageal reflux disease)   . H/O hiatal hernia   . Arthritis     hands, knees, lower back  . 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 Appendectomy    . Knee surgery      Right   . Tonsillectomy and adenoidectomy    . Abdominal hysterectomy    . Carpal tunnel release      Right   . Cesarean section      x 1   . Colonoscopy  2004  . Cystocele repair N/A 10/25/2012    Procedure: ANTERIOR REPAIR (CYSTOCELE);  Surgeon: Gus Height, MD;  Location: [facility];  Service: Gynecology;  Laterality: N/A;  . Cardioversion N/A 03/20/2014    Procedure: CARDIOVERSION;  Surgeon: Josue Hector, MD;  Location: [facility];  Service: Cardiovascular;   Laterality: N/A;    reports that she quit smoking about 11 years ago. Her smoking use included Cigarettes. She has a 45 pack-year smoking history. She has never used smokeless tobacco. She reports that she does not drink alcohol or use illicit drugs. family history includes Colon polyps in her mother; Diabetes in her mother; Hyperlipidemia in her mother; Hypertension in her mother; Stroke in her father. There is no history of Colon cancer. Allergies  Allergen Reactions  . Codeine Nausea And Vomiting  . Tramadol Hcl Nausea And Vomiting    REACTION: mouth dryness, headache  . Vicodin [Hydrocodone-Acetaminophen] Nausea And Vomiting   Current Outpatient Prescriptions on File Prior to Visit  Medication Sig Dispense Refill  . acetaminophen (TYLENOL) 650 MG CR tablet Take 1,300 mg by mouth 2 (two) times daily.    Marland Kitchen apixaban (ELIQUIS) 5 MG TABS tablet Take 1 tablet (5 mg total) by mouth 2 (two) times daily. 60 tablet 6  . b complex vitamins tablet Take 1 tablet by mouth daily.    . benzonatate (TESSALON PERLES) 100 MG capsule 1-2 tabs by mouth every 6 hrs as needed for cough 60 capsule 0  . BLACK COHOSH EXTRACT PO Take 40 mg by mouth daily.    . cetirizine (ZYRTEC) 10 MG tablet Take 10 mg by mouth daily.     . cholecalciferol (  VITAMIN D) 1000 UNITS tablet Take 1,000 Units by mouth daily.    Mariane Baumgarten Calcium (STOOL SOFTENER PO) Take by mouth as directed.    . escitalopram (LEXAPRO) 10 MG tablet TAKE ONE TABLET BY MOUTH ONCE DAILY 90 tablet 3  . estradiol (ESTRACE) 2 MG tablet TAKE ONE TABLET BY MOUTH ONCE DAILY 90 tablet 3  . flecainide (TAMBOCOR) 50 MG tablet Take 1 tablet (50 mg total) by mouth 2 (two) times daily. 180 tablet 3  . Fluticasone-Salmeterol (ADVAIR DISKUS) 500-50 MCG/DOSE AEPB Inhale 1 puff into the lungs 2 (two) times daily. 60 each 11  . Garlic 1497 MG CAPS Take 1 capsule by mouth at bedtime.     . GuaiFENesin (MUCUS RELIEF ADULT PO) Take by mouth as directed.    . irbesartan  (AVAPRO) 150 MG tablet Take 1 tablet (150 mg total) by mouth daily. 90 tablet 3  . Loperamide HCl (ANTI-DIARRHEAL PO) Take by mouth as directed.    Marland Kitchen LORazepam (ATIVAN) 1 MG tablet TAKE ONE TABLET BY MOUTH AT BEDTIME AS NEEDED FOR ANXIETY 30 tablet 5  . metoprolol (LOPRESSOR) 50 MG tablet TAKE ONE and ONE  HALF TABLETS (1 AND 1/2) BY MOUTH TWICE DAILY    . Multiple Vitamin (MULTIVITAMIN) tablet Take 1 tablet by mouth daily.      . Omega-3 Fatty Acids (FISH OIL) 1200 MG CAPS Take 1 capsule by mouth daily.     . pantoprazole (PROTONIX) 40 MG tablet TAKE ONE TABLET BY MOUTH ONCE DAILY 90 tablet 1  . potassium gluconate 595 MG TABS Take 595 mg by mouth every other day.    . pravastatin (PRAVACHOL) 40 MG tablet TAKE ONE TABLET BY MOUTH ONCE DAILY 90 tablet 3  . vitamin C (ASCORBIC ACID) 500 MG tablet Take 500 mg by mouth daily as needed (for cold symptoms).    . vitamin E 400 UNIT capsule Take 400 Units by mouth daily.     No current facility-administered medications on file prior to visit.   Review of Systems  Constitutional: Negative for unusual diaphoresis or night sweats HENT: Negative for ringing in ear or discharge Eyes: Negative for double vision or worsening visual disturbance.  Respiratory: Negative for choking and stridor.   Gastrointestinal: Negative for vomiting or other signifcant bowel change Genitourinary: Negative for hematuria or change in urine volume.  Musculoskeletal: Negative for other MSK pain or swelling Skin: Negative for color change and worsening wound.  Neurological: Negative for tremors and numbness other than noted  Psychiatric/Behavioral: Negative for decreased concentration or agitation other than above       Objective:   Physical Exam BP 134/90 mmHg  Pulse 87  Temp(Src) 98.9 F (37.2 C) (Oral)  Resp 18  Ht 5\' 2"  (1.575 m)  Wt 242 lb 1.3 oz (109.807 kg)  BMI 44.27 kg/m2  SpO2 93% VS noted, not ill appearing Constitutional: Pt appears in no significant  distress HENT: Head: NCAT.  Right Ear: External ear normal.  Left Ear: External ear normal.  Bilat tm's with mild erythema.  Max sinus areas non tender.  Pharynx with mild erythema, no exudate Eyes: . Pupils are equal, round, and reactive to light. Conjunctivae and EOM are normal Neck: Normal range of motion. Neck supple.  Cardiovascular: Normal rate and regular rhythm.   Pulmonary/Chest: Effort normal and breath sounds mild decreased without rales or wheezing.  Neurological: Pt is alert. Not confused , motor grossly intact Skin: Skin is warm. No rash, no LE edema Psychiatric:  Pt behavior is normal. No agitation.     Assessment & Plan:

## 2014-06-26 NOTE — Assessment & Plan Note (Signed)
stable overall by history and exam, recent data reviewed with pt, and pt to continue medical treatment as before,  to f/u any worsening symptoms or concerns Lab Results  Component Value Date   HGBA1C 6.3 04/04/2014   Pt to call for onset polys or cbg > 200 on steroid tx

## 2014-06-26 NOTE — Patient Instructions (Signed)
You had the steroid shot today  Please take all new medication as prescribed - the prednisone  Please continue all other medications as before, and refills have been done if requested.  Please have the pharmacy call with any other refills you may need  Please keep your appointments with your specialists as you may have planned     

## 2014-06-26 NOTE — Assessment & Plan Note (Signed)
Mild, also to improve with steroid tx, cont inhalers,  to f/u any worsening symptoms or concerns

## 2014-06-27 ENCOUNTER — Telehealth: Payer: Self-pay | Admitting: Cardiovascular Disease

## 2014-06-27 NOTE — Telephone Encounter (Signed)
Pt asking for recommendation for nasal congestion.

## 2014-06-27 NOTE — Telephone Encounter (Signed)
New message    patient calling went to PCP on yesterday .  Was prescribe steroid - infection.    Patient is asking can Dr. Johnsie Cancel sinus medication. S/p afib.

## 2014-06-27 NOTE — Telephone Encounter (Signed)
Advised pt to try coricidin HBP and to avoid decongestants.

## 2014-07-25 ENCOUNTER — Other Ambulatory Visit: Payer: Self-pay | Admitting: Internal Medicine

## 2014-07-30 ENCOUNTER — Other Ambulatory Visit: Payer: Self-pay | Admitting: Internal Medicine

## 2014-08-14 ENCOUNTER — Ambulatory Visit
Admission: RE | Admit: 2014-08-14 | Discharge: 2014-08-14 | Disposition: A | Discharge status: Home / Self Care | Payer: Medicare Other | Source: Ambulatory Visit | Attending: Sports Medicine | Admitting: Sports Medicine

## 2014-08-14 ENCOUNTER — Ambulatory Visit (INDEPENDENT_AMBULATORY_CARE_PROVIDER_SITE_OTHER): Payer: Medicare Other | Admitting: Sports Medicine

## 2014-08-14 ENCOUNTER — Encounter: Payer: Self-pay | Admitting: Sports Medicine

## 2014-08-14 VITALS — BP 143/85 | Ht 62.0 in | Wt 240.0 lb

## 2014-08-14 DIAGNOSIS — R2231 Localized swelling, mass and lump, right upper limb: Secondary | ICD-10-CM

## 2014-08-14 DIAGNOSIS — M1710 Unilateral primary osteoarthritis, unspecified knee: Secondary | ICD-10-CM

## 2014-08-14 DIAGNOSIS — M129 Arthropathy, unspecified: Secondary | ICD-10-CM

## 2014-08-14 MED ORDER — METHYLPREDNISOLONE ACETATE 40 MG/ML IJ SUSP
40.0000 mg | Freq: Once | INTRAMUSCULAR | Status: AC
  Administered 2014-08-14: 40 mg via INTRA_ARTICULAR

## 2014-08-14 NOTE — Progress Notes (Addendum)
   Subjective:    Patient ID: Andrea Perry, female    DOB: [birth year], 64 y.o.   MRN: [id number]  HPI chief complaint: Left knee pain and right arm "bump"  Patient comes in today with returning left knee pain. She has a documented history of left knee DJD. She saw Dr. Noemi Chapel a few months ago who recommended a total knee arthroplasty. However, she has not yet been cleared by her cardiologist or pulmonologist. Her hope is that she will receive clearance by July. Her pain is diffuse. She gets reoccurring swelling as well. She has done well with cortisone injections in the past and would like to receive another one today. In regards to her right arm she has noticed an enlarging bump along the ulnar aspect of her forearm for about a year or so. She has not had any imaging. It is not painful but is definitely enlarging. No known trauma.  Interim medical history reviewed. History significant for COPD, asthma, and atrial fibrillation Medications reviewed Allergies reviewed    Review of Systems     Objective:   Physical Exam Obese. No acute distress. Awake alert and oriented 3. Vital signs reviewed.  Right forearm: There is a rather large immobile mass that appears to originate from the ulnar shaft in the right forearm. It is not cystic. It is not fluctuant. It is not painful to touch or palpate. Does not interfere with elbow or wrist range of motion. Good pronation and supination of the forearm as well.  Left knee: Limited range of motion from 0-100. Trace to 1+ effusion. Diffuse tenderness to palpation. Grossly stable to ligamentous exam. Neurovascular intact distally. Walking with a limp.       Assessment & Plan:  Right forearm mass of unknown etiology Left knee pain secondary to DJD  For her left knee pain I recommended a repeat cortisone injection. This is done using an anterior lateral approach. She tolerates this without difficulty. She understands that definitive treatment is a  total knee arthroplasty but she will need to wait for clearance from both cardiology and pulmonology. In regards to her right forearm mass I want to start with getting a plain x-ray. I will follow this up with an MRI to thoroughly evaluate both bony and soft tissue lesions. Follow-up with her via telephone after I reviewed those studies at which point we will decide whether or not any specific treatment is necessary.  Consent obtained and verified. Time-out conducted. Noted no overlying erythema, induration, or other signs of local infection. Skin prepped in a sterile fashion. Topical analgesic spray: Ethyl chloride. Joint: left knee Needle: 22g 1.5 inch Completed without difficulty. Meds: 3cc 1% xylocaine, 1cc (40mg ) depomedrol  Advised to call if fevers/chills, erythema, induration, drainage, or persistent bleeding.   Addendum: X-rays of the right forearm are unremarkable. Proceed with an MRI as discussed above.

## 2014-08-20 ENCOUNTER — Telehealth: Payer: Self-pay | Admitting: Cardiovascular Disease

## 2014-08-20 NOTE — Telephone Encounter (Signed)
New message      Request for surgical clearance:  What type of surgery is being performed? 2 teeth extraction When is this surgery scheduled?  Pending clearance Are there any medications that need to be held prior to surgery and how long? eliquis Name of physician performing surgery? Dr Romie Minus 1. What is your office phone and fax number? Fax [phone]

## 2014-08-20 NOTE — Telephone Encounter (Signed)
She was supporsed to have f/u [facility] on flecainide If she wants teeth pulled can stop eliquis 2 days before But knee surgery will be delayed Wanted to try to get her in NSR before elective knee surgery

## 2014-08-20 NOTE — Telephone Encounter (Signed)
Pt states her priority is having her teeth extracted. She is not scheduled for knee surgery yet, her plan is to see Dr Johnsie Cancel 10/26/14 and discuss DCCV/knee surgery at that time.  Per Dr Johnsie Cancel-- pt needs to be on Eliquis continuously for 3 weeks to have DCCV, pt states she understands this.  Pt states she is feeling fine and denies any palpitations.   Pt advised I will forward this note to Dr Romie Minus  with Dr Sondra Barges to stop Eliquis 2 days before dental extractions.

## 2014-08-28 ENCOUNTER — Encounter: Payer: Self-pay | Admitting: Emergency Medicine

## 2014-08-28 ENCOUNTER — Ambulatory Visit (INDEPENDENT_AMBULATORY_CARE_PROVIDER_SITE_OTHER): Payer: Medicare Other | Admitting: Emergency Medicine

## 2014-08-28 VITALS — BP 110/80 | HR 122 | Ht 62.0 in | Wt 260.0 lb

## 2014-08-28 DIAGNOSIS — J439 Emphysema, unspecified: Secondary | ICD-10-CM

## 2014-08-28 NOTE — Assessment & Plan Note (Signed)
COPD with an asthma component that appears to be the most part stable since her last visit. She still has intermittent symptoms and exertional limitation. We stopped her Spiriva 6 months ago and she does not appear to miss it. She has gained 10 pounds and we discussed increasing her exercise today. She has occasional cough but no change from. Her prior visit. At this time I would like to continue her Advair and albuterol when necessary. We discussed using albuterol preemptively before exercise and increasing her overall exercise regimen. She will continue her omeprazole and Zyrtec to control asthma and cough triggers. rov 6

## 2014-08-28 NOTE — Patient Instructions (Signed)
Please continue your Advair twice a day  Use albuterol 2 puffs as needed for shortness of breath or wheezing Try to slowly increase your daily exercise. Consider pretreating exercise with 2 puffs of albuterol Follow with Dr Andrea Perry in 6 months or sooner if you have any problems

## 2014-08-28 NOTE — Progress Notes (Signed)
   Subjective:    Patient ID: Andrea Perry, female    DOB: Apr 03, 1951, 64 y.o.   MRN: [id number]  HPI 64 yo woman, hx of childhood asthma, hx tobacco (45 pk-yrs), allergies, GERD, carries a dx of COPD and adult asthma made by Dr Jenny Reichmann. She has worked in a [employer] and had to have spirometry as a part of her job. She is referred today for her COPD and also for pre-op eval for L knee surgery w Dr Noemi Chapel. She describes some exertional dyspnea, happens after 15ft. She has some occasional cough, non-productive. She sometimes hears wheeze. She has had episodes of light-headedness when walking an extended distance.   ROV 02/14/14 -- follow-up visit for COPD and history of childhood asthma. She was also recently diagnosed with atrial fibrillation - on eliquis and metoprolol. Last visit we added Spiriva to her Advair to see if she will benefit. She believed that she benefited from the [place], but note that she was having work done on her A fib and rate control at the same time.   ROV 08/28/14 -- Follow-up visit for COPD and a history of asthma. She described variably in her breathing, good days and bad days. She has gained about 10 lbs. She has exertional SOB, especially w housework but not every time. She is on Advair bid, ventolin prn > averages 3x a week. She doesn't seem to miss spiriva. She has occasional coughing. She remains on zyrtec and omeprazole    Review of Systems  Constitutional: Negative for fever and unexpected weight change.  HENT: Negative for congestion, dental problem, ear pain, nosebleeds, postnasal drip, rhinorrhea, sinus pressure, sneezing, sore throat and trouble swallowing.   Eyes: Negative for redness and itching.  Respiratory: Positive for cough and shortness of breath. Negative for chest tightness and wheezing.   Cardiovascular: Negative for palpitations and leg swelling.  Gastrointestinal: Negative for nausea and vomiting.  Genitourinary: Negative for dysuria.    Musculoskeletal: Negative for joint swelling.  Skin: Negative for rash.  Neurological: Negative for headaches.  Hematological: Does not bruise/bleed easily.  Psychiatric/Behavioral: Negative for dysphoric mood. The patient is not nervous/anxious.        Objective:   Physical Exam Filed Vitals:   08/28/14 1041  BP: 110/80  Pulse: 122  Height: 5\' 2"  (1.575 m)  Weight: 260 lb (117.935 kg)  SpO2: 96%        Assessment & Plan:  COPD (chronic obstructive pulmonary disease) COPD with an asthma component that appears to be the most part stable since her last visit. She still has intermittent symptoms and exertional limitation. We stopped her Spiriva 6 months ago and she does not appear to miss it. She has gained 10 pounds and we discussed increasing her exercise today. She has occasional cough but no change from. Her prior visit. At this time I would like to continue her Advair and albuterol when necessary. We discussed using albuterol preemptively before exercise and increasing her overall exercise regimen. She will continue her omeprazole and Zyrtec to control asthma and cough triggers. rov 6

## 2014-08-30 ENCOUNTER — Other Ambulatory Visit: Payer: Self-pay | Admitting: Sports Medicine

## 2014-08-30 ENCOUNTER — Ambulatory Visit
Admission: RE | Admit: 2014-08-30 | Discharge: 2014-08-30 | Disposition: A | Discharge status: Home / Self Care | Payer: Medicare Other | Source: Ambulatory Visit | Attending: Sports Medicine | Admitting: Sports Medicine

## 2014-08-30 DIAGNOSIS — R2231 Localized swelling, mass and lump, right upper limb: Secondary | ICD-10-CM

## 2014-08-31 ENCOUNTER — Other Ambulatory Visit: Payer: Self-pay | Admitting: Internal Medicine

## 2014-09-05 ENCOUNTER — Other Ambulatory Visit: Payer: Self-pay | Admitting: *Deleted

## 2014-09-05 MED ORDER — DIAZEPAM 10 MG PO TABS: ORAL_TABLET | ORAL | Status: DC

## 2014-09-06 ENCOUNTER — Other Ambulatory Visit: Payer: Self-pay | Admitting: Internal Medicine

## 2014-09-13 ENCOUNTER — Other Ambulatory Visit: Payer: Self-pay | Admitting: Cardiovascular Disease

## 2014-09-19 ENCOUNTER — Other Ambulatory Visit: Payer: Medicare Other

## 2014-09-19 ENCOUNTER — Ambulatory Visit
Admission: RE | Admit: 2014-09-19 | Discharge: 2014-09-19 | Disposition: A | Discharge status: Home / Self Care | Payer: Medicare Other | Source: Ambulatory Visit | Attending: Sports Medicine | Admitting: Sports Medicine

## 2014-09-19 DIAGNOSIS — R2231 Localized swelling, mass and lump, right upper limb: Secondary | ICD-10-CM

## 2014-09-28 ENCOUNTER — Other Ambulatory Visit: Payer: Self-pay | Admitting: *Deleted

## 2014-09-28 DIAGNOSIS — M79601 Pain in right arm: Secondary | ICD-10-CM

## 2014-10-04 ENCOUNTER — Other Ambulatory Visit (INDEPENDENT_AMBULATORY_CARE_PROVIDER_SITE_OTHER): Payer: Medicare Other

## 2014-10-04 ENCOUNTER — Encounter: Payer: Self-pay | Admitting: Internal Medicine

## 2014-10-04 ENCOUNTER — Ambulatory Visit (INDEPENDENT_AMBULATORY_CARE_PROVIDER_SITE_OTHER): Payer: Medicare Other | Admitting: Internal Medicine

## 2014-10-04 VITALS — BP 132/84 | HR 92 | Temp 97.8°F | Ht 62.0 in | Wt 256.0 lb

## 2014-10-04 DIAGNOSIS — R7989 Other specified abnormal findings of blood chemistry: Secondary | ICD-10-CM | POA: Diagnosis not present

## 2014-10-04 DIAGNOSIS — Z0000 Encounter for general adult medical examination without abnormal findings: Secondary | ICD-10-CM

## 2014-10-04 DIAGNOSIS — F329 Major depressive disorder, single episode, unspecified: Secondary | ICD-10-CM | POA: Diagnosis not present

## 2014-10-04 DIAGNOSIS — E119 Type 2 diabetes mellitus without complications: Secondary | ICD-10-CM

## 2014-10-04 DIAGNOSIS — F32A Depression, unspecified: Secondary | ICD-10-CM

## 2014-10-04 LAB — LIPID PANEL
Cholesterol: 120 mg/dL (ref 0–200)
HDL: 32.7 mg/dL — ABNORMAL LOW (ref >39.00)
NonHDL: 87.3
Total CHOL/HDL Ratio: 4
Triglycerides: 311 mg/dL — ABNORMAL HIGH (ref 0.0–149.0)
VLDL: 62.2 mg/dL — ABNORMAL HIGH (ref 0.0–40.0)

## 2014-10-04 LAB — URINALYSIS, ROUTINE W REFLEX MICROSCOPIC
Bilirubin Urine: NEGATIVE
Hgb urine dipstick: NEGATIVE
LEUKOCYTES UA: NEGATIVE
Nitrite: NEGATIVE
SPECIFIC GRAVITY, URINE: 1.02 (ref 1.000–1.030)
Total Protein, Urine: 30 — AB
UROBILINOGEN UA: 0.2 (ref 0.0–1.0)
Urine Glucose: NEGATIVE
pH: 6.5 (ref 5.0–8.0)

## 2014-10-04 LAB — CBC WITH DIFFERENTIAL/PLATELET
Basophils Absolute: 0 10*3/uL (ref 0.0–0.1)
Basophils Relative: 0.3 % (ref 0.0–3.0)
EOS ABS: 0.2 10*3/uL (ref 0.0–0.7)
EOS PCT: 2.4 % (ref 0.0–5.0)
HCT: 39.2 % (ref 36.0–46.0)
Hemoglobin: 13 g/dL (ref 12.0–15.0)
Lymphocytes Relative: 34.1 % (ref 12.0–46.0)
Lymphs Abs: 2.7 10*3/uL (ref 0.7–4.0)
MCHC: 33.1 g/dL (ref 30.0–36.0)
MCV: 95.6 fl (ref 78.0–100.0)
Monocytes Absolute: 0.4 10*3/uL (ref 0.1–1.0)
Monocytes Relative: 5.4 % (ref 3.0–12.0)
NEUTROS PCT: 57.8 % (ref 43.0–77.0)
Neutro Abs: 4.7 10*3/uL (ref 1.4–7.7)
PLATELETS: 238 10*3/uL (ref 150.0–400.0)
RBC: 4.1 Mil/uL (ref 3.87–5.11)
RDW: 14 % (ref 11.5–15.5)
WBC: 8.1 10*3/uL (ref 4.0–10.5)

## 2014-10-04 LAB — BASIC METABOLIC PANEL
BUN: 12 mg/dL (ref 6–23)
CALCIUM: 8.3 mg/dL — AB (ref 8.4–10.5)
CHLORIDE: 98 meq/L (ref 96–112)
CO2: 33 mEq/L — ABNORMAL HIGH (ref 19–32)
Creatinine, Ser: 0.72 mg/dL (ref 0.40–1.20)
GFR: 87.31 mL/min (ref >60.00)
GLUCOSE: 149 mg/dL — AB (ref 70–99)
Potassium: 4.2 mEq/L (ref 3.5–5.1)
Sodium: 140 mEq/L (ref 135–145)

## 2014-10-04 LAB — MICROALBUMIN / CREATININE URINE RATIO
Creatinine,U: 234.9 mg/dL
MICROALB/CREAT RATIO: 4.4 mg/g (ref 0.0–30.0)
Microalb, Ur: 10.4 mg/dL — ABNORMAL HIGH (ref 0.0–1.9)

## 2014-10-04 LAB — HEPATIC FUNCTION PANEL
ALT: 19 U/L (ref 0–35)
AST: 20 U/L (ref 0–37)
Albumin: 3.8 g/dL (ref 3.5–5.2)
Alkaline Phosphatase: 55 U/L (ref 39–117)
Bilirubin, Direct: 0.2 mg/dL (ref 0.0–0.3)
TOTAL PROTEIN: 6.2 g/dL (ref 6.0–8.3)
Total Bilirubin: 0.7 mg/dL (ref 0.2–1.2)

## 2014-10-04 LAB — TSH: TSH: 3.02 u[IU]/mL (ref 0.35–4.50)

## 2014-10-04 LAB — HEMOGLOBIN A1C: HEMOGLOBIN A1C: 6.2 % (ref 4.6–6.5)

## 2014-10-04 LAB — LDL CHOLESTEROL, DIRECT: LDL DIRECT: 51 mg/dL

## 2014-10-04 MED ORDER — ESCITALOPRAM OXALATE 20 MG PO TABS: 20.0000 mg | ORAL_TABLET | Freq: Every day | ORAL | Status: DC

## 2014-10-04 NOTE — Assessment & Plan Note (Signed)
stable overall by history and exam, recent data reviewed with pt, and pt to continue medical treatment as before,  to f/u any worsening symptoms or concerns Lab Results  Component Value Date   HGBA1C 6.3 04/04/2014

## 2014-10-04 NOTE — Progress Notes (Signed)
Subjective:    Patient ID: Andrea Perry, female    DOB: [birth year], 64 y.o.   MRN: [id number]  HPI  Here for wellness and f/u;  Overall doing ok;  Pt denies Chest pain, worsening SOB, DOE, wheezing, orthopnea, PND, worsening LE edema, palpitations, dizziness or syncope.  Pt denies neurological change such as new headache, facial or extremity weakness.  Pt denies polydipsia, polyuria, or low sugar symptoms. Pt states overall good compliance with treatment and medications, good tolerability, and has been trying to follow appropriate diet.  Pt has had mild worsening depressive symptoms, no suicidal ideation or panic. No fever, night sweats, wt loss, loss of appetite, or other constitutional symptoms.  Pt states good ability with ADL's, has low fall risk, home safety reviewed and adequate, no other significant changes in hearing or vision, and only occasionally active with exercise.  May have gained a few labs in last 6 mo due to ongoing chronic back pain Will need left knee replacement, just not scheudled yet.  Will need MRi soon for right forearm july14, with ? Need for surgury after.  Past Medical History  Diagnosis Date  . Impaired glucose tolerance 09/21/2010  . ALLERGIC RHINITIS 08/16/2008  . ANXIETY 08/16/2008  . ASTHMA 08/16/2008  . COPD 08/16/2008  . DEPRESSION 08/16/2008  . GENITAL HERPES 12/03/2006  . HEMATOCHEZIA 06/20/2009  . HYPERLIPIDEMIA 08/16/2008  . HYPERTENSION 12/03/2006  . IBS 06/20/2009    hx per patient, no current problem  . Hemorrhoids   . HYPOTHYROIDISM 12/03/2006    History only - no current problem  . GERD (gastroesophageal reflux disease)   . H/O hiatal hernia   . Arthritis     hands, knees, lower back  . 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 Appendectomy    . Knee surgery      Right   . Tonsillectomy and adenoidectomy    . Abdominal hysterectomy    . Carpal tunnel release      Right   . Cesarean  section      x 1   . Colonoscopy  2004  . Cystocele repair N/A 10/25/2012    Procedure: ANTERIOR REPAIR (CYSTOCELE);  Surgeon: Gus Height, MD;  Location: [facility];  Service: Gynecology;  Laterality: N/A;  . Cardioversion N/A 03/20/2014    Procedure: CARDIOVERSION;  Surgeon: Josue Hector, MD;  Location: [facility] END[facility];  Service: Cardiovascular;  Laterality: N/A;    reports that she quit smoking about 11 years ago. Her smoking use included Cigarettes. She has a 45 pack-year smoking history. She has never used smokeless tobacco. She reports that she does not drink alcohol or use illicit drugs. family history includes Colon polyps in her mother; Diabetes in her mother; Hyperlipidemia in her mother; Hypertension in her mother; Stroke in her father. There is no history of Colon cancer. Allergies  Allergen Reactions  . Codeine Nausea And Vomiting  . Tramadol Hcl Nausea And Vomiting    REACTION: mouth dryness, headache  . Vicodin [Hydrocodone-Acetaminophen] Nausea And Vomiting   Current Outpatient Prescriptions on File Prior to Visit  Medication Sig Dispense Refill  . acetaminophen (TYLENOL) 650 MG CR tablet Take 1,300 mg by mouth 2 (two) times daily.    Marland Kitchen b complex vitamins tablet Take 1 tablet by mouth daily.    . benzonatate (TESSALON PERLES) 100 MG capsule 1-2 tabs by mouth every 6 hrs as needed  for cough 60 capsule 0  . BLACK COHOSH EXTRACT PO Take 40 mg by mouth daily.    . cetirizine (ZYRTEC) 10 MG tablet Take 10 mg by mouth daily.     . cholecalciferol (VITAMIN D) 1000 UNITS tablet Take 1,000 Units by mouth daily.    . diazepam (VALIUM) 10 MG tablet Take one 1 hour prior to your MRI 2 tablet 0  . Docusate Calcium (STOOL SOFTENER PO) Take by mouth as directed.    Marland Kitchen ELIQUIS 5 MG TABS tablet TAKE ONE TABLET BY MOUTH TWICE DAILY 60 tablet 1  . escitalopram (LEXAPRO) 10 MG tablet TAKE ONE TABLET BY MOUTH ONCE DAILY 90 tablet 3  . estradiol (ESTRACE) 2 MG tablet TAKE ONE TABLET BY MOUTH ONCE DAILY  90 tablet 3  . flecainide (TAMBOCOR) 50 MG tablet Take 1 tablet (50 mg total) by mouth 2 (two) times daily. 180 tablet 3  . Fluticasone-Salmeterol (ADVAIR DISKUS) 500-50 MCG/DOSE AEPB Inhale 1 puff into the lungs 2 (two) times daily. 60 each 11  . furosemide (LASIX) 40 MG tablet Take 1 tablet (40 mg total) by mouth daily. 90 tablet 3  . Garlic 6295 MG CAPS Take 1 capsule by mouth at bedtime.     . GuaiFENesin (MUCUS RELIEF ADULT PO) Take by mouth as directed.    . irbesartan (AVAPRO) 150 MG tablet TAKE ONE TABLET BY MOUTH ONCE DAILY 90 tablet 3  . Loperamide HCl (ANTI-DIARRHEAL PO) Take by mouth as directed.    Marland Kitchen LORazepam (ATIVAN) 1 MG tablet TAKE ONE TABLET BY MOUTH AT BEDTIME AS NEEDED FOR ANXIETY 30 tablet 5  . metoprolol (LOPRESSOR) 50 MG tablet TAKE ONE and ONE  HALF TABLETS (1 AND 1/2) BY MOUTH TWICE DAILY    . Multiple Vitamin (MULTIVITAMIN) tablet Take 1 tablet by mouth daily.      . Omega-3 Fatty Acids (FISH OIL) 1200 MG CAPS Take 1 capsule by mouth daily.     . pantoprazole (PROTONIX) 40 MG tablet TAKE ONE TABLET BY MOUTH ONCE DAILY 90 tablet 3  . potassium gluconate 595 MG TABS Take 595 mg by mouth every other day.    . pravastatin (PRAVACHOL) 40 MG tablet TAKE ONE TABLET BY MOUTH ONCE DAILY 90 tablet 3  . VENTOLIN HFA 108 (90 BASE) MCG/ACT inhaler INHALE TWO PUFFS BY MOUTH INTO THE LUNGS EVERY 6 HOURS AS NEEDED 54 each 0  . vitamin C (ASCORBIC ACID) 500 MG tablet Take 500 mg by mouth daily as needed (for cold symptoms).    . vitamin E 400 UNIT capsule Take 400 Units by mouth daily.     No current facility-administered medications on file prior to visit.    Review of Systems  Constitutional: Negative for increased diaphoresis, other activity, appetite or siginficant weight change other than noted HENT: Negative for worsening hearing loss, ear pain, facial swelling, mouth sores and neck stiffness.   Eyes: Negative for other worsening pain, redness or visual disturbance.    Respiratory: Negative for shortness of breath and wheezing  Cardiovascular: Negative for chest pain and palpitations.  Gastrointestinal: Negative for diarrhea, blood in stool, abdominal distention or other pain Genitourinary: Negative for hematuria, flank pain or change in urine volume.  Musculoskeletal: Negative for myalgias or other joint complaints.  Skin: Negative for color change and wound or drainage.  Neurological: Negative for syncope and numbness. other than noted Hematological: Negative for adenopathy. or other swelling Psychiatric/Behavioral: Negative for hallucinations, SI, self-injury, decreased concentration or other worsening agitation.  Objective:   Physical Exam BP 132/84 mmHg  Pulse 92  Temp(Src) 97.8 F (36.6 C) (Oral)  Ht 5\' 2"  (1.575 m)  Wt 256 lb (116.121 kg)  BMI 46.81 kg/m2  SpO2 97% /VS noted,  Constitutional: Pt is oriented to person, place, and time. Appears well-developed and well-nourished, in no significant distress Head: Normocephalic and atraumatic.  Right Ear: External ear normal.  Left Ear: External ear normal.  Nose: Nose normal.  Mouth/Throat: Oropharynx is clear and moist.  Eyes: Conjunctivae and EOM are normal. Pupils are equal, round, and reactive to light.  Neck: Normal range of motion. Neck supple. No JVD present. No tracheal deviation present or significant neck LA or mass Cardiovascular: Normal rate, regular rhythm, normal heart sounds and intact distal pulses.   Pulmonary/Chest: Effort normal and breath sounds without rales or wheezing  Abdominal: Soft. Bowel sounds are normal. NT. No HSM  Musculoskeletal: Normal range of motion. Exhibits no edema.  Lymphadenopathy:  Has no cervical adenopathy.  Neurological: Pt is alert and oriented to person, place, and time. Pt has normal reflexes. No cranial nerve deficit. Motor grossly intact Skin: Skin is warm and dry. No rash noted.  Psychiatric:  Has depressed  mood and affect. Behavior is  normal. \    Assessment & Plan:

## 2014-10-04 NOTE — Progress Notes (Signed)
Pre visit review using our clinic review tool, if applicable. No additional management support is needed unless otherwise documented below in the visit note. 

## 2014-10-04 NOTE — Patient Instructions (Signed)
OK to increase the Lexapro to 20 mg per day  Please continue all other medications as before, and refills have been done if requested.  Please have the pharmacy call with any other refills you may need.  Please continue your efforts at being more active, low cholesterol diet, and weight control.  You are otherwise up to date with prevention measures today.  Please keep your appointments with your specialists as you may have planned  Please go to the LAB in the Basement (turn left off the elevator) for the tests to be done today  You will be contacted by phone if any changes need to be made immediately.  Otherwise, you will receive a letter about your results with an explanation, but please check with MyChart first.  Please remember to sign up for MyChart if you have not done so, as this will be important to you in the future with finding out test results, communicating by private email, and scheduling acute appointments online when needed.  Please return in 6 months, or sooner if needed, with Lab testing done 3-5 days before

## 2014-10-04 NOTE — Assessment & Plan Note (Signed)

## 2014-10-04 NOTE — Assessment & Plan Note (Signed)
Ok to incresae the lexapro to 20 mg per day, delcines referral counseling,  to f/u any worsening symptoms or concerns

## 2014-10-09 ENCOUNTER — Telehealth: Payer: Self-pay | Admitting: Internal Medicine

## 2014-10-09 NOTE — Telephone Encounter (Signed)
Error

## 2014-10-25 NOTE — Progress Notes (Signed)
Patient ID: Andrea Perry, female   DOB: 24-Nov-1950, 64 y.o.   MRN: [id number] 64 y.o.  referred by Dr Noemi Chapel for preop clearance 11/15   She has significant arthritis of both knees. Previous left knee surgery 1.5 years ago with no complications. She has severe COPD. Primary is Dr Jenny Reichmann. No documented CAD. She described chest pain intermitant not always exertional Central with radiation to left. CRF HTN and elevated lipids on Rx She was seen by pulmonary and started on inhalers Dr Lamonte Sakai felt moderate risk for surgery FEV1 59% predicted no desats Also felt deconditioning had some to do with her dyspnea. Today she was noted to be in rapid afib. No history of but does get palpitations No contraindications to anticoagulation Her knee surgery is elective She has noted increasing dyspnea over the last 6-8 months No cough , mild wheezing   Echo 10/26 ok   Study Conclusions  - Left ventricle: Inferobasal hypokinesis. The cavity size was normal. Wall thickness was normal. Systolic function was normal. The estimated ejection fraction was in the range of 50% to 55%. - Left atrium: The atrium was mildly dilated. - Atrial septum: No defect or patent foramen ovale was identified. - Pulmonary arteries: PA peak pressure: 41 mm Hg (S).   She had more joint pain and xarelto changed to eliquis. Failed Orion on 03/20/14 started on flecainide Unable to do ETT due to arthritis Lexiscan  04/2014 no ischemia or infarction.   Stopped Eliquis for dental extraction and needs knee surgery  Been back on it for over 3 weeks Discussed [facility] with her willing to proceed  Has soft tissue mass on right forearm and getting MRI with anesthesia next week     ROS: Denies fever, malais, weight loss, blurry vision, decreased visual acuity, cough, sputum, SOB, hemoptysis, pleuritic pain, palpitaitons, heartburn, abdominal pain, melena, lower extremity edema, claudication, or rash.  All other systems reviewed and  negative  General: Affect appropriate Healthy:  appears stated age 64: normal Neck supple with no adenopathy JVP normal no bruits no thyromegaly Lungs clear with no wheezing and good diaphragmatic motion Heart:  S1/S2 no murmur, no rub, gallop or click PMI normal Abdomen: benighn, BS positve, no tenderness, no AAA no bruit.  No HSM or HJR Distal pulses intact with no bruits No edema Neuro non-focal Skin warm and dry Firm soft tissue mass right forearm    Current Outpatient Prescriptions  Medication Sig Dispense Refill  . acetaminophen (TYLENOL) 650 MG CR tablet Take 1,300 mg by mouth 2 (two) times daily.    Marland Kitchen b complex vitamins tablet Take 1 tablet by mouth daily.    . benzonatate (TESSALON PERLES) 100 MG capsule 1-2 tabs by mouth every 6 hrs as needed for cough 60 capsule 0  . BLACK COHOSH EXTRACT PO Take 40 mg by mouth daily.    . cetirizine (ZYRTEC) 10 MG tablet Take 10 mg by mouth daily.     . cholecalciferol (VITAMIN D) 1000 UNITS tablet Take 1,000 Units by mouth daily.    . diazepam (VALIUM) 10 MG tablet Take one 1 hour prior to your MRI 2 tablet 0  . Docusate Calcium (STOOL SOFTENER PO) Take by mouth as directed.    Marland Kitchen ELIQUIS 5 MG TABS tablet TAKE ONE TABLET BY MOUTH TWICE DAILY 60 tablet 1  . escitalopram (LEXAPRO) 20 MG tablet Take 1 tablet (20 mg total) by mouth daily. 90 tablet 3  . estradiol (ESTRACE) 2 MG tablet TAKE ONE TABLET  BY MOUTH ONCE DAILY 90 tablet 3  . flecainide (TAMBOCOR) 50 MG tablet Take 1 tablet (50 mg total) by mouth 2 (two) times daily. 180 tablet 3  . Fluticasone-Salmeterol (ADVAIR DISKUS) 500-50 MCG/DOSE AEPB Inhale 1 puff into the lungs 2 (two) times daily. 60 each 11  . furosemide (LASIX) 40 MG tablet Take 1 tablet (40 mg total) by mouth daily. 90 tablet 3  . Garlic 5374 MG CAPS Take 1 capsule by mouth at bedtime.     . GuaiFENesin (MUCUS RELIEF ADULT PO) Take by mouth as directed.    . irbesartan (AVAPRO) 150 MG tablet TAKE ONE TABLET BY  MOUTH ONCE DAILY 90 tablet 3  . Loperamide HCl (ANTI-DIARRHEAL PO) Take by mouth as directed.    Marland Kitchen LORazepam (ATIVAN) 1 MG tablet TAKE ONE TABLET BY MOUTH AT BEDTIME AS NEEDED FOR ANXIETY 30 tablet 5  . metoprolol (LOPRESSOR) 50 MG tablet TAKE ONE and ONE  HALF TABLETS (1 AND 1/2) BY MOUTH TWICE DAILY    . Multiple Vitamin (MULTIVITAMIN) tablet Take 1 tablet by mouth daily.      . Omega-3 Fatty Acids (FISH OIL) 1200 MG CAPS Take 1 capsule by mouth daily.     . pantoprazole (PROTONIX) 40 MG tablet TAKE ONE TABLET BY MOUTH ONCE DAILY 90 tablet 3  . potassium gluconate 595 MG TABS Take 595 mg by mouth every other day.    . pravastatin (PRAVACHOL) 40 MG tablet TAKE ONE TABLET BY MOUTH ONCE DAILY 90 tablet 3  . VENTOLIN HFA 108 (90 BASE) MCG/ACT inhaler INHALE TWO PUFFS BY MOUTH INTO THE LUNGS EVERY 6 HOURS AS NEEDED 54 each 0  . vitamin C (ASCORBIC ACID) 500 MG tablet Take 500 mg by mouth daily as needed (for cold symptoms).    . vitamin E 400 UNIT capsule Take 400 Units by mouth daily.     No current facility-administered medications for this visit.    Allergies  Codeine; Tramadol hcl; and Vicodin  Electrocardiogram: afib nonspecific ST/T wave changes 03/20/14  04/17/14  afib rate 96 nonspecific ST changes no change from prevoius   10/26/14  Afib rate 102  QT 392/510  Assessment and Plan Afib:  Rate controlled failed [place] Now on flecainide.  Rx Eliquis Schedule Rebecca for 10/29/12  Will need 3 weeks of uninterupted anticoagulation before knee surgery Orders written and [facility] endo called to schedule HTN:  Well controlled.  Continue current medications and low sodium Dash type diet.   Chol:   Cholesterol is at goal.  Continue current dose of statin and diet Rx.  No myalgias or side effects.  F/U  LFT's in 6 months. Lab Results  Component Value Date   LDLCALC 50 04/04/2014           Arm:  Soft tissue mass hard firm over right forearm  Plain film not involving bone f/u MRI 11/01/14

## 2014-10-26 ENCOUNTER — Encounter: Payer: Self-pay | Admitting: *Deleted

## 2014-10-26 ENCOUNTER — Ambulatory Visit (INDEPENDENT_AMBULATORY_CARE_PROVIDER_SITE_OTHER): Payer: Medicare Other | Admitting: Cardiovascular Disease

## 2014-10-26 ENCOUNTER — Encounter: Payer: Self-pay | Admitting: Cardiovascular Disease

## 2014-10-26 ENCOUNTER — Telehealth: Payer: Self-pay | Admitting: *Deleted

## 2014-10-26 VITALS — BP 140/94 | HR 98 | Ht 62.0 in | Wt 259.0 lb

## 2014-10-26 DIAGNOSIS — Z01812 Encounter for preprocedural laboratory examination: Secondary | ICD-10-CM

## 2014-10-26 LAB — CBC WITH DIFFERENTIAL/PLATELET
Basophils Absolute: 0 10*3/uL (ref 0.0–0.1)
Basophils Relative: 0.2 % (ref 0.0–3.0)
Eosinophils Absolute: 0.2 10*3/uL (ref 0.0–0.7)
Eosinophils Relative: 2.5 % (ref 0.0–5.0)
HCT: 39.1 % (ref 36.0–46.0)
HEMOGLOBIN: 12.8 g/dL (ref 12.0–15.0)
LYMPHS ABS: 2.5 10*3/uL (ref 0.7–4.0)
Lymphocytes Relative: 30.9 % (ref 12.0–46.0)
MCHC: 32.7 g/dL (ref 30.0–36.0)
MCV: 95.9 fl (ref 78.0–100.0)
Monocytes Absolute: 0.4 10*3/uL (ref 0.1–1.0)
Monocytes Relative: 5 % (ref 3.0–12.0)
Neutro Abs: 5 10*3/uL (ref 1.4–7.7)
Neutrophils Relative %: 61.4 % (ref 43.0–77.0)
PLATELETS: 209 10*3/uL (ref 150.0–400.0)
RBC: 4.08 Mil/uL (ref 3.87–5.11)
RDW: 12.9 % (ref 11.5–15.5)
WBC: 8.1 10*3/uL (ref 4.0–10.5)

## 2014-10-26 LAB — BASIC METABOLIC PANEL
BUN: 13 mg/dL (ref 6–23)
CHLORIDE: 98 meq/L (ref 96–112)
CO2: 30 meq/L (ref 19–32)
Calcium: 8.1 mg/dL — ABNORMAL LOW (ref 8.4–10.5)
Creatinine, Ser: 0.69 mg/dL (ref 0.40–1.20)
GFR: 91.69 mL/min (ref >60.00)
GLUCOSE: 131 mg/dL — AB (ref 70–99)
POTASSIUM: 3.1 meq/L — AB (ref 3.5–5.1)
Sodium: 140 mEq/L (ref 135–145)

## 2014-10-26 NOTE — Pre-Procedure Instructions (Signed)
    Andrea Perry  10/26/2014      [facility] [address], [place] - [address] [place] [address] Phone: [phone] Fax: [phone]    Your procedure is scheduled on 11/01/14.  Report to [facility] Admitting at 6 A.M.  Call this number if you have problems the morning of surgery:  [phone]   Remember:  Do not eat food or drink liquids after midnight.  Take these medicines the morning of surgery with A SIP OF WATER- tylenol,zyrtec,valium,lexapro,all inhales,lopressor,protonix   Do not wear jewelry, make-up or nail polish.  Do not wear lotions, powders, or perfumes.  You may wear deodorant.  Do not shave 48 hours prior to surgery.  Men may shave face and neck.  Do not bring valuables to the hospital.  [facility] is not responsible for any belongings or valuables.  Contacts, dentures or bridgework may not be worn into surgery.  Leave your suitcase in the car.  After surgery it may be brought to your room.  For patients admitted to the hospital, discharge time will be determined by your treatment team.  Patients discharged the day of surgery will not be allowed to drive home. Name and phone number of your driver:    Special instructions:   Please read over the following fact sheets that you were given. Pain Booklet, Coughing and Deep Breathing and Surgical Site Infection Prevention

## 2014-10-26 NOTE — Telephone Encounter (Signed)
LEFT VOICE  MAIL  THAT   PT  NEEDS TO  BE  AT  SHORT  STAY  ON July 12  AT  9:30 AM   FOR  CARDIOVERSION NOT  AT  10:00 AM .Adonis Housekeeper

## 2014-10-26 NOTE — Patient Instructions (Signed)
Medication Instructions:  NO CHANGES   Labwork: TODAY  BMET  CBC  Testing/Procedures: Your physician has recommended that you have a Cardioversion (DCCV). Electrical Cardioversion uses a jolt of electricity to your heart either through paddles or wired patches attached to your chest. This is a controlled, usually prescheduled, procedure. Defibrillation is done under light anesthesia in the hospital, and you usually go home the day of the procedure. This is done to get your heart back into a normal rhythm. You are not awake for the procedure. Please see the instruction sheet given to you today.    Follow-Up: Your physician wants you to follow-up in: [place] will receive a reminder letter in the mail two months in advance. If you don't receive a letter, please call our office to schedule the follow-up appointment.  Any Other Special Instructions Will Be Listed Below (If Applicable).

## 2014-10-26 NOTE — Addendum Note (Signed)
Addended by: Derl Barrow on: 10/26/2014 02:45 PM   Modules accepted: Orders

## 2014-10-29 ENCOUNTER — Encounter (HOSPITAL_COMMUNITY): Payer: Self-pay

## 2014-10-29 ENCOUNTER — Encounter (HOSPITAL_COMMUNITY)
Admission: RE | Admit: 2014-10-29 | Discharge: 2014-10-29 | Disposition: A | Discharge status: Home / Self Care | Payer: Medicare Other | Source: Ambulatory Visit | Attending: Sports Medicine | Admitting: Sports Medicine

## 2014-10-29 ENCOUNTER — Other Ambulatory Visit: Payer: Self-pay | Admitting: *Deleted

## 2014-10-29 ENCOUNTER — Telehealth: Payer: Self-pay | Admitting: Cardiovascular Disease

## 2014-10-29 DIAGNOSIS — Z79890 Hormone replacement therapy (postmenopausal): Secondary | ICD-10-CM | POA: Diagnosis not present

## 2014-10-29 DIAGNOSIS — J449 Chronic obstructive pulmonary disease, unspecified: Secondary | ICD-10-CM | POA: Diagnosis not present

## 2014-10-29 DIAGNOSIS — E785 Hyperlipidemia, unspecified: Secondary | ICD-10-CM | POA: Diagnosis not present

## 2014-10-29 DIAGNOSIS — Z7901 Long term (current) use of anticoagulants: Secondary | ICD-10-CM | POA: Diagnosis not present

## 2014-10-29 DIAGNOSIS — N189 Chronic kidney disease, unspecified: Secondary | ICD-10-CM | POA: Diagnosis not present

## 2014-10-29 DIAGNOSIS — Z7951 Long term (current) use of inhaled steroids: Secondary | ICD-10-CM | POA: Diagnosis not present

## 2014-10-29 DIAGNOSIS — K219 Gastro-esophageal reflux disease without esophagitis: Secondary | ICD-10-CM | POA: Diagnosis not present

## 2014-10-29 DIAGNOSIS — E876 Hypokalemia: Secondary | ICD-10-CM

## 2014-10-29 DIAGNOSIS — M13862 Other specified arthritis, left knee: Secondary | ICD-10-CM | POA: Diagnosis not present

## 2014-10-29 DIAGNOSIS — E039 Hypothyroidism, unspecified: Secondary | ICD-10-CM | POA: Diagnosis not present

## 2014-10-29 DIAGNOSIS — J45909 Unspecified asthma, uncomplicated: Secondary | ICD-10-CM | POA: Diagnosis not present

## 2014-10-29 DIAGNOSIS — M13861 Other specified arthritis, right knee: Secondary | ICD-10-CM | POA: Diagnosis not present

## 2014-10-29 DIAGNOSIS — Z79899 Other long term (current) drug therapy: Secondary | ICD-10-CM | POA: Diagnosis not present

## 2014-10-29 DIAGNOSIS — Z87891 Personal history of nicotine dependence: Secondary | ICD-10-CM | POA: Diagnosis not present

## 2014-10-29 DIAGNOSIS — F329 Major depressive disorder, single episode, unspecified: Secondary | ICD-10-CM | POA: Diagnosis not present

## 2014-10-29 DIAGNOSIS — F419 Anxiety disorder, unspecified: Secondary | ICD-10-CM | POA: Diagnosis not present

## 2014-10-29 DIAGNOSIS — M7989 Other specified soft tissue disorders: Secondary | ICD-10-CM | POA: Diagnosis not present

## 2014-10-29 DIAGNOSIS — I4891 Unspecified atrial fibrillation: Secondary | ICD-10-CM | POA: Diagnosis present

## 2014-10-29 DIAGNOSIS — I129 Hypertensive chronic kidney disease with stage 1 through stage 4 chronic kidney disease, or unspecified chronic kidney disease: Secondary | ICD-10-CM | POA: Diagnosis not present

## 2014-10-29 HISTORY — DX: Cardiac arrhythmia, unspecified: I49.9

## 2014-10-29 MED ORDER — POTASSIUM CHLORIDE CRYS ER 20 MEQ PO TBCR: 20.0000 meq | EXTENDED_RELEASE_TABLET | Freq: Every day | ORAL | Status: DC

## 2014-10-29 NOTE — Telephone Encounter (Signed)
Follow Up  Pt returned call. Belives that it is to discuss upcomming cardioversion

## 2014-10-29 NOTE — Telephone Encounter (Signed)
PT AWARE OF LAB RESULTS./CY 

## 2014-10-29 NOTE — Progress Notes (Signed)
Anesthesia Chart Review:  Pt is 64 year old female scheduled for MRI of R forearm with anesthesia on 11/01/2014.   Cardiologist is Dr. Johnsie Cancel. Pt is scheduled for cardioversion 10/30/14.    PMH includes: atrial fibrillation, HTN, asthma, COPD, hyperlipidemia, impaired glucose tolerance, remote hx hypothyroidism, GERD. Former smoker. BMI 47. S/p cardioversion 03/20/14. S/p cystocele repair 10/25/12.   Medications include: eliquis, flecainide, advair, lasix, irbesartan, metoprolol, protonix, potassium, pravastatin, albuterol.   Preoperative labs 10/26/2014 reviewed.    Chest x-ray 06/01/2014 reviewed. No edema or consolidation.   EKG 10/26/2014: atrial fibrillation with RVR with premature ventricular or aberrantly conducted complexes. Low voltage QRS. Nonspecific T wave abnormality.   Nuclear stress test 04/23/2014: Low risk stress nuclear study with small size and mild intensity fixed distal anteroapical breast attenuation artifact. LV Ejection Fraction: 44%. LV Wall Motion: Mild global hypokinesis   Echo 02/12/2014: - Left ventricle: Inferobasal hypokinesis. The cavity size was normal. Wall thickness was normal. Systolic function was normal. The estimated ejection fraction was in the range of 50% to 55%. - Left atrium: The atrium was mildly dilated. - Atrial septum: No defect or patent foramen ovale was identified. - Pulmonary arteries: PA peak pressure: 41 mm Hg (S).  There is an H&P in Epic by PCP Dr. Cathlean Cower dated 10/04/2014.  Willeen Cass, FNP-BC [facility] Stay [facility]/Anesthesiology Phone: [phone] 10/29/2014 2:55 PM

## 2014-10-29 NOTE — Pre-Procedure Instructions (Signed)
    Andrea Perry  10/29/2014      [facility] [address], [place] - [address] [place] [address] Phone: [phone] Fax: [phone]    Your procedure is scheduled on 11/01/14.  Report to [facility] Admitting at 6 A.M.  Call this number if you have problems the morning of surgery:  [phone]   Remember:  Do not eat food or drink liquids after midnight.  Take these medicines the morning of surgery with A SIP OF WATER valium,lexapro,zytrec,lopressor,protonix   Do not wear jewelry, make-up or nail polish.  Do not wear lotions, powders, or perfumes.  You may wear deodorant.  Do not shave 48 hours prior to surgery.  Men may shave face and neck.  Do not bring valuables to the hospital.  [facility] is not responsible for any belongings or valuables.  Contacts, dentures or bridgework may not be worn into surgery.  Leave your suitcase in the car.  After surgery it may be brought to your room.  For patients admitted to the hospital, discharge time will be determined by your treatment team.  Patients discharged the day of surgery will not be allowed to drive home.   Name and phone number of your driver:    Special instructions:    Please read over the following fact sheets that you were given. Pain Booklet and Coughing and Deep Breathing

## 2014-10-30 ENCOUNTER — Ambulatory Visit (HOSPITAL_COMMUNITY): Payer: Medicare Other | Admitting: Certified Registered"

## 2014-10-30 ENCOUNTER — Encounter (HOSPITAL_COMMUNITY): Payer: Self-pay | Admitting: *Deleted

## 2014-10-30 ENCOUNTER — Encounter (HOSPITAL_COMMUNITY)
Admission: RE | Disposition: A | Discharge status: Home / Self Care | Payer: Self-pay | Source: Ambulatory Visit | Attending: Cardiovascular Disease

## 2014-10-30 ENCOUNTER — Other Ambulatory Visit: Payer: Self-pay

## 2014-10-30 ENCOUNTER — Ambulatory Visit (HOSPITAL_COMMUNITY)
Admission: RE | Admit: 2014-10-30 | Discharge: 2014-10-30 | Disposition: A | Discharge status: Home / Self Care | Payer: Medicare Other | Source: Ambulatory Visit | Attending: Cardiovascular Disease | Admitting: Cardiovascular Disease

## 2014-10-30 DIAGNOSIS — K219 Gastro-esophageal reflux disease without esophagitis: Secondary | ICD-10-CM | POA: Insufficient documentation

## 2014-10-30 DIAGNOSIS — J45909 Unspecified asthma, uncomplicated: Secondary | ICD-10-CM | POA: Insufficient documentation

## 2014-10-30 DIAGNOSIS — Z79890 Hormone replacement therapy (postmenopausal): Secondary | ICD-10-CM | POA: Insufficient documentation

## 2014-10-30 DIAGNOSIS — F329 Major depressive disorder, single episode, unspecified: Secondary | ICD-10-CM | POA: Insufficient documentation

## 2014-10-30 DIAGNOSIS — M7989 Other specified soft tissue disorders: Secondary | ICD-10-CM | POA: Insufficient documentation

## 2014-10-30 DIAGNOSIS — Z9889 Other specified postprocedural states: Secondary | ICD-10-CM

## 2014-10-30 DIAGNOSIS — J449 Chronic obstructive pulmonary disease, unspecified: Secondary | ICD-10-CM | POA: Insufficient documentation

## 2014-10-30 DIAGNOSIS — N189 Chronic kidney disease, unspecified: Secondary | ICD-10-CM | POA: Insufficient documentation

## 2014-10-30 DIAGNOSIS — I4891 Unspecified atrial fibrillation: Secondary | ICD-10-CM

## 2014-10-30 DIAGNOSIS — M13862 Other specified arthritis, left knee: Secondary | ICD-10-CM | POA: Diagnosis not present

## 2014-10-30 DIAGNOSIS — Z79899 Other long term (current) drug therapy: Secondary | ICD-10-CM | POA: Insufficient documentation

## 2014-10-30 DIAGNOSIS — F419 Anxiety disorder, unspecified: Secondary | ICD-10-CM | POA: Insufficient documentation

## 2014-10-30 DIAGNOSIS — Z87891 Personal history of nicotine dependence: Secondary | ICD-10-CM | POA: Insufficient documentation

## 2014-10-30 DIAGNOSIS — E785 Hyperlipidemia, unspecified: Secondary | ICD-10-CM | POA: Insufficient documentation

## 2014-10-30 DIAGNOSIS — Z9289 Personal history of other medical treatment: Secondary | ICD-10-CM

## 2014-10-30 DIAGNOSIS — M13861 Other specified arthritis, right knee: Secondary | ICD-10-CM | POA: Insufficient documentation

## 2014-10-30 DIAGNOSIS — E039 Hypothyroidism, unspecified: Secondary | ICD-10-CM | POA: Insufficient documentation

## 2014-10-30 DIAGNOSIS — Z7951 Long term (current) use of inhaled steroids: Secondary | ICD-10-CM | POA: Insufficient documentation

## 2014-10-30 DIAGNOSIS — I480 Paroxysmal atrial fibrillation: Secondary | ICD-10-CM

## 2014-10-30 DIAGNOSIS — Z7901 Long term (current) use of anticoagulants: Secondary | ICD-10-CM | POA: Insufficient documentation

## 2014-10-30 DIAGNOSIS — I129 Hypertensive chronic kidney disease with stage 1 through stage 4 chronic kidney disease, or unspecified chronic kidney disease: Secondary | ICD-10-CM | POA: Insufficient documentation

## 2014-10-30 HISTORY — DX: Other specified postprocedural states: Z98.890

## 2014-10-30 HISTORY — PX: CARDIOVERSION: SHX1299

## 2014-10-30 HISTORY — DX: Personal history of other medical treatment: Z92.89

## 2014-10-30 LAB — POCT I-STAT 4, (NA,K, GLUC, HGB,HCT)
Glucose, Bld: 154 mg/dL — ABNORMAL HIGH (ref 65–99)
HCT: 38 % (ref 36.0–46.0)
HEMOGLOBIN: 12.9 g/dL (ref 12.0–15.0)
POTASSIUM: 3.4 mmol/L — AB (ref 3.5–5.1)
Sodium: 141 mmol/L (ref 135–145)

## 2014-10-30 SURGERY — CARDIOVERSION
Anesthesia: General

## 2014-10-30 MED ORDER — SODIUM CHLORIDE 0.9 % IV SOLN
INTRAVENOUS | Status: DC
  Administered 2014-10-30: 500 mL via INTRAVENOUS

## 2014-10-30 MED ORDER — HYDROCORTISONE 1 % EX OINT: 1.0000 "application " | TOPICAL_OINTMENT | Freq: Once | CUTANEOUS | Status: DC

## 2014-10-30 MED ORDER — LIDOCAINE HCL (CARDIAC) 20 MG/ML IV SOLN
INTRAVENOUS | Status: DC | PRN
  Administered 2014-10-30: 20 mg via INTRAVENOUS

## 2014-10-30 MED ORDER — PROPOFOL 10 MG/ML IV BOLUS
INTRAVENOUS | Status: DC | PRN
Start: 2014-10-30 — End: 2014-10-30
  Administered 2014-10-30: 80 mg via INTRAVENOUS

## 2014-10-30 NOTE — Anesthesia Preprocedure Evaluation (Addendum)
Anesthesia Evaluation  Patient identified by MRN, date of birth, ID band Patient awake    Reviewed: Allergy & Precautions, NPO status , Patient's Chart, lab work & pertinent test results, reviewed documented beta blocker date and time   History of Anesthesia Complications Negative for: history of anesthetic complications  Airway Mallampati: I  TM Distance: >3 FB Neck ROM: Full    Dental  (+) Missing   Pulmonary asthma , COPD COPD inhaler, former smoker,  breath sounds clear to auscultation        Cardiovascular hypertension, Pt. on medications and Pt. on home beta blockers + dysrhythmias Atrial Fibrillation Rhythm:Irregular Rate:Normal     Neuro/Psych PSYCHIATRIC DISORDERS Anxiety Depression    GI/Hepatic hiatal hernia, GERD-  ,  Endo/Other  Hypothyroidism   Renal/GU      Musculoskeletal  (+) Arthritis -,   Abdominal   Peds  Hematology   Anesthesia Other Findings   Reproductive/Obstetrics                           Anesthesia Physical Anesthesia Plan  ASA: III  Anesthesia Plan: General   Post-op Pain Management:    Induction: Intravenous  Airway Management Planned: Mask  Additional Equipment:   Intra-op Plan:   Post-operative Plan:   Informed Consent: I have reviewed the patients History and Physical, chart, labs and discussed the procedure including the risks, benefits and alternatives for the proposed anesthesia with the patient or authorized representative who has indicated his/her understanding and acceptance.   Dental advisory given  Plan Discussed with: Anesthesiologist and Surgeon  Anesthesia Plan Comments:         Anesthesia Quick Evaluation

## 2014-10-30 NOTE — H&P (View-Only) (Signed)
Patient ID: Andrea Perry, female   DOB: [birth year], 64 y.o.   MRN: [id number] 64 y.o.  referred by Dr Noemi Chapel for preop clearance 11/15   She has significant arthritis of both knees. Previous left knee surgery 1.5 years ago with no complications. She has severe COPD. Primary is Dr Jenny Reichmann. No documented CAD. She described chest pain intermitant not always exertional Central with radiation to left. CRF HTN and elevated lipids on Rx She was seen by pulmonary and started on inhalers Dr Lamonte Sakai felt moderate risk for surgery FEV1 59% predicted no desats Also felt deconditioning had some to do with her dyspnea. Today she was noted to be in rapid afib. No history of but does get palpitations No contraindications to anticoagulation Her knee surgery is elective She has noted increasing dyspnea over the last 6-8 months No cough , mild wheezing   Echo 10/26 ok   Study Conclusions  - Left ventricle: Inferobasal hypokinesis. The cavity size was normal. Wall thickness was normal. Systolic function was normal. The estimated ejection fraction was in the range of 50% to 55%. - Left atrium: The atrium was mildly dilated. - Atrial septum: No defect or patent foramen ovale was identified. - Pulmonary arteries: PA peak pressure: 41 mm Hg (S).   She had more joint pain and xarelto changed to eliquis. Failed [employer] on 03/20/14 started on flecainide Unable to do ETT due to arthritis Lexiscan  04/2014 no ischemia or infarction.   Stopped Eliquis for dental extraction and needs knee surgery  Been back on it for over 3 weeks Discussed [facility] with her willing to proceed  Has soft tissue mass on right forearm and getting MRI with anesthesia next week     ROS: Denies fever, malais, weight loss, blurry vision, decreased visual acuity, cough, sputum, SOB, hemoptysis, pleuritic pain, palpitaitons, heartburn, abdominal pain, melena, lower extremity edema, claudication, or rash.  All other systems reviewed and  negative  General: Affect appropriate Healthy:  appears stated age 61: normal Neck supple with no adenopathy JVP normal no bruits no thyromegaly Lungs clear with no wheezing and good diaphragmatic motion Heart:  S1/S2 no murmur, no rub, gallop or click PMI normal Abdomen: benighn, BS positve, no tenderness, no AAA no bruit.  No HSM or HJR Distal pulses intact with no bruits No edema Neuro non-focal Skin warm and dry Firm soft tissue mass right forearm    Current Outpatient Prescriptions  Medication Sig Dispense Refill  . acetaminophen (TYLENOL) 650 MG CR tablet Take 1,300 mg by mouth 2 (two) times daily.    Marland Kitchen b complex vitamins tablet Take 1 tablet by mouth daily.    . benzonatate (TESSALON PERLES) 100 MG capsule 1-2 tabs by mouth every 6 hrs as needed for cough 60 capsule 0  . BLACK COHOSH EXTRACT PO Take 40 mg by mouth daily.    . cetirizine (ZYRTEC) 10 MG tablet Take 10 mg by mouth daily.     . cholecalciferol (VITAMIN D) 1000 UNITS tablet Take 1,000 Units by mouth daily.    . diazepam (VALIUM) 10 MG tablet Take one 1 hour prior to your MRI 2 tablet 0  . Docusate Calcium (STOOL SOFTENER PO) Take by mouth as directed.    Marland Kitchen ELIQUIS 5 MG TABS tablet TAKE ONE TABLET BY MOUTH TWICE DAILY 60 tablet 1  . escitalopram (LEXAPRO) 20 MG tablet Take 1 tablet (20 mg total) by mouth daily. 90 tablet 3  . estradiol (ESTRACE) 2 MG tablet TAKE ONE TABLET  BY MOUTH ONCE DAILY 90 tablet 3  . flecainide (TAMBOCOR) 50 MG tablet Take 1 tablet (50 mg total) by mouth 2 (two) times daily. 180 tablet 3  . Fluticasone-Salmeterol (ADVAIR DISKUS) 500-50 MCG/DOSE AEPB Inhale 1 puff into the lungs 2 (two) times daily. 60 each 11  . furosemide (LASIX) 40 MG tablet Take 1 tablet (40 mg total) by mouth daily. 90 tablet 3  . Garlic 1610 MG CAPS Take 1 capsule by mouth at bedtime.     . GuaiFENesin (MUCUS RELIEF ADULT PO) Take by mouth as directed.    . irbesartan (AVAPRO) 150 MG tablet TAKE ONE TABLET BY  MOUTH ONCE DAILY 90 tablet 3  . Loperamide HCl (ANTI-DIARRHEAL PO) Take by mouth as directed.    Marland Kitchen LORazepam (ATIVAN) 1 MG tablet TAKE ONE TABLET BY MOUTH AT BEDTIME AS NEEDED FOR ANXIETY 30 tablet 5  . metoprolol (LOPRESSOR) 50 MG tablet TAKE ONE and ONE  HALF TABLETS (1 AND 1/2) BY MOUTH TWICE DAILY    . Multiple Vitamin (MULTIVITAMIN) tablet Take 1 tablet by mouth daily.      . Omega-3 Fatty Acids (FISH OIL) 1200 MG CAPS Take 1 capsule by mouth daily.     . pantoprazole (PROTONIX) 40 MG tablet TAKE ONE TABLET BY MOUTH ONCE DAILY 90 tablet 3  . potassium gluconate 595 MG TABS Take 595 mg by mouth every other day.    . pravastatin (PRAVACHOL) 40 MG tablet TAKE ONE TABLET BY MOUTH ONCE DAILY 90 tablet 3  . VENTOLIN HFA 108 (90 BASE) MCG/ACT inhaler INHALE TWO PUFFS BY MOUTH INTO THE LUNGS EVERY 6 HOURS AS NEEDED 54 each 0  . vitamin C (ASCORBIC ACID) 500 MG tablet Take 500 mg by mouth daily as needed (for cold symptoms).    . vitamin E 400 UNIT capsule Take 400 Units by mouth daily.     No current facility-administered medications for this visit.    Allergies  Codeine; Tramadol hcl; and Vicodin  Electrocardiogram: afib nonspecific ST/T wave changes 03/20/14  04/17/14  afib rate 96 nonspecific ST changes no change from prevoius   10/26/14  Afib rate 102  QT 392/510  Assessment and Plan Afib:  Rate controlled failed [place] Now on flecainide.  Rx Eliquis Schedule [place] for 10/29/12  Will need 3 weeks of uninterupted anticoagulation before knee surgery Orders written and [facility] endo called to schedule HTN:  Well controlled.  Continue current medications and low sodium Dash type diet.   Chol:   Cholesterol is at goal.  Continue current dose of statin and diet Rx.  No myalgias or side effects.  F/U  LFT's in 6 months. Lab Results  Component Value Date   LDLCALC 50 04/04/2014           Arm:  Soft tissue mass hard firm over right forearm  Plain film not involving bone f/u MRI 11/01/14

## 2014-10-30 NOTE — Anesthesia Postprocedure Evaluation (Signed)
  Anesthesia Post-op Note  Patient: Andrea Perry  Procedure(s) Performed: Procedure(s): CARDIOVERSION (N/A)  Patient Location: Endoscopy Unit  Anesthesia Type:General  Level of Consciousness: awake, alert , oriented and patient cooperative  Airway and Oxygen Therapy: Patient Spontanous Breathing  Post-op Pain: none  Post-op Assessment: Post-op Vital signs reviewed, Patient's Cardiovascular Status Stable, Respiratory Function Stable, Patent Airway and Pain level controlled              Post-op Vital Signs: stable  Last Vitals:  Filed Vitals:   10/30/14 0958  BP:   Pulse: 48  Temp:   Resp: 13    Complications: No apparent anesthesia complications

## 2014-10-30 NOTE — Discharge Instructions (Signed)

## 2014-10-30 NOTE — CV Procedure (Signed)
[place]: Anesthesia:  [place] Rx Eliquis with no missed doses and NPO  [place] x2  120J then 200J  Converted from afib rate 101 to SB rate 52  ECG ordered No immediate neurological sequelae  Jenkins Rouge

## 2014-10-30 NOTE — Transfer of Care (Signed)
Immediate Anesthesia Transfer of Care Note  Patient: Andrea Perry  Procedure(s) Performed: Procedure(s): CARDIOVERSION (N/A)  Patient Location: Endoscopy Unit  Anesthesia Type:General  Level of Consciousness: awake, alert  and oriented  Airway & Oxygen Therapy: Patient Spontanous Breathing and Patient connected to nasal cannula oxygen  Post-op Assessment: Report given to RN and Post -op Vital signs reviewed and stable  Post vital signs: Reviewed and stable  Last Vitals:  Filed Vitals:   10/30/14 0831  BP: 137/79  Pulse: 110  Temp: 36.8 C  Resp: 17    Complications: No apparent anesthesia complications

## 2014-10-30 NOTE — Interval H&P Note (Signed)
History and Physical Interval Note:  10/30/2014 8:06 AM  Andrea Perry  has presented today for surgery, with the diagnosis of afib  The various methods of treatment have been discussed with the patient and family. After consideration of risks, benefits and other options for treatment, the patient has consented to  Procedure(s): CARDIOVERSION (N/A) as a surgical intervention .  The patient's history has been reviewed, patient examined, no change in status, stable for surgery.  I have reviewed the patient's chart and labs.  Questions were answered to the patient's satisfaction.     Jenkins Rouge

## 2014-10-31 ENCOUNTER — Encounter (HOSPITAL_COMMUNITY): Payer: Self-pay | Admitting: Cardiovascular Disease

## 2014-11-01 ENCOUNTER — Ambulatory Visit (HOSPITAL_COMMUNITY)
Admission: RE | Admit: 2014-11-01 | Discharge: 2014-11-01 | Disposition: A | Discharge status: Home / Self Care | Payer: Medicare Other | Source: Ambulatory Visit | Attending: Sports Medicine | Admitting: Sports Medicine

## 2014-11-01 ENCOUNTER — Encounter (HOSPITAL_COMMUNITY): Payer: Self-pay | Admitting: Anesthesiology

## 2014-11-01 ENCOUNTER — Encounter (HOSPITAL_COMMUNITY)
Admission: RE | Disposition: A | Discharge status: Home / Self Care | Payer: Self-pay | Source: Ambulatory Visit | Attending: Sports Medicine

## 2014-11-01 ENCOUNTER — Ambulatory Visit (HOSPITAL_COMMUNITY): Payer: Medicare Other | Admitting: Anesthesiology

## 2014-11-01 ENCOUNTER — Ambulatory Visit (HOSPITAL_COMMUNITY): Payer: Medicare Other | Admitting: Vascular Surgery

## 2014-11-01 DIAGNOSIS — M79601 Pain in right arm: Secondary | ICD-10-CM | POA: Diagnosis not present

## 2014-11-01 DIAGNOSIS — R2231 Localized swelling, mass and lump, right upper limb: Secondary | ICD-10-CM | POA: Insufficient documentation

## 2014-11-01 HISTORY — DX: Other specified postprocedural states: Z98.890

## 2014-11-01 HISTORY — PX: RADIOLOGY WITH ANESTHESIA: SHX6223

## 2014-11-01 SURGERY — RADIOLOGY WITH ANESTHESIA
Anesthesia: General | Laterality: Right

## 2014-11-01 MED ORDER — ONDANSETRON HCL 4 MG/2ML IJ SOLN: 4.0000 mg | Freq: Once | INTRAMUSCULAR | Status: DC | PRN

## 2014-11-01 MED ORDER — LACTATED RINGERS IV SOLN
INTRAVENOUS | Status: DC | PRN
  Administered 2014-11-01: 08:00:00 via INTRAVENOUS

## 2014-11-01 MED ORDER — GADOBENATE DIMEGLUMINE 529 MG/ML IV SOLN
20.0000 mL | Freq: Once | INTRAVENOUS | Status: AC | PRN
  Administered 2014-11-01: 20 mL via INTRAVENOUS

## 2014-11-01 NOTE — Transfer of Care (Signed)
Immediate Anesthesia Transfer of Care Note  Patient: Andrea Perry  Procedure(s) Performed: Procedure(s): MRI RIGHT FOREARM (Right)  Patient Location: PACU  Anesthesia Type:General  Level of Consciousness: awake, alert  and oriented  Airway & Oxygen Therapy: Patient Spontanous Breathing and Patient connected to nasal cannula oxygen  Post-op Assessment: Report given to RN and Post -op Vital signs reviewed and stable  Post vital signs: Reviewed and stable  Last Vitals:  Filed Vitals:   11/01/14 0953  BP: 129/65  Pulse: 56  Temp: 36.6 C  Resp: 13    Complications: No apparent anesthesia complications

## 2014-11-01 NOTE — Anesthesia Postprocedure Evaluation (Signed)
  Anesthesia Post-op Note  Patient: Andrea Perry  Procedure(s) Performed: Procedure(s): MRI RIGHT FOREARM (Right)  Patient Location: PACU  Anesthesia Type:General  Level of Consciousness: awake, alert , oriented and patient cooperative  Airway and Oxygen Therapy: Patient Spontanous Breathing  Post-op Pain: none  Post-op Assessment: Post-op Vital signs reviewed, Patient's Cardiovascular Status Stable, Respiratory Function Stable, Patent Airway, No signs of Nausea or vomiting and Pain level controlled              Post-op Vital Signs: stable  Last Vitals:  Filed Vitals:   11/01/14 0953  BP: 129/65  Pulse: 56  Temp: 36.6 C  Resp: 13    Complications: No apparent anesthesia complications

## 2014-11-01 NOTE — Anesthesia Preprocedure Evaluation (Signed)
Anesthesia Evaluation  Patient identified by MRN, date of birth, ID band Patient awake    Reviewed: Allergy & Precautions, NPO status , Patient's Chart, lab work & pertinent test results  Airway Mallampati: I       Dental   Pulmonary asthma , COPDformer smoker,    + decreased breath sounds      Cardiovascular hypertension, + dysrhythmias Atrial Fibrillation Rhythm:Irregular Rate:Abnormal     Neuro/Psych Anxiety Depression    GI/Hepatic hiatal hernia, GERD-  ,  Endo/Other  diabetes, Type 2Hypothyroidism   Renal/GU      Musculoskeletal  (+) Arthritis -,   Abdominal   Peds  Hematology   Anesthesia Other Findings   Reproductive/Obstetrics                             Anesthesia Physical Anesthesia Plan  ASA: III  Anesthesia Plan: General   Post-op Pain Management:    Induction: Intravenous  Airway Management Planned: Oral ETT  Additional Equipment:   Intra-op Plan:   Post-operative Plan: Extubation in OR  Informed Consent: I have reviewed the patients History and Physical, chart, labs and discussed the procedure including the risks, benefits and alternatives for the proposed anesthesia with the patient or authorized representative who has indicated his/her understanding and acceptance.     Plan Discussed with: CRNA, Anesthesiologist and Surgeon  Anesthesia Plan Comments:         Anesthesia Quick Evaluation

## 2014-11-02 ENCOUNTER — Telehealth: Payer: Self-pay | Admitting: Sports Medicine

## 2014-11-02 ENCOUNTER — Encounter (HOSPITAL_COMMUNITY): Payer: Self-pay | Admitting: Radiology

## 2014-11-02 NOTE — Telephone Encounter (Signed)
I spoke with the patient on the phone today after reviewing the MRI of her right forearm both with and without contrast. MRI had to be performed under sedation. There is no specific mass or significant abnormality in the area of concern. Underlying muscles and bone appear to be within normal limits. 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 I believe that this is most likely the diagnosis. I reassured the patient that there are no worrisome findings here and my recommendation is to just take a simple watchful waiting approach with this. Patient is in agreement. She will follow-up with me as needed.

## 2014-11-08 ENCOUNTER — Other Ambulatory Visit: Payer: Self-pay | Admitting: Cardiovascular Disease

## 2014-11-08 ENCOUNTER — Telehealth: Payer: Self-pay | Admitting: *Deleted

## 2014-11-08 DIAGNOSIS — R931 Abnormal findings on diagnostic imaging of heart and coronary circulation: Secondary | ICD-10-CM

## 2014-11-08 NOTE — Telephone Encounter (Signed)
PT  AWARE IS  DUE  TO HAVE CARDIAC  MRI   NOTIFIED PT  SCHEDULER  WILL BE   CALLING .Adonis Housekeeper

## 2014-11-09 MED FILL — Ephedrine Sulf-NaCl Soln Pref Syr 50 MG/10ML-0.9% (5 MG/ML): INTRAVENOUS | Qty: 10 | Status: AC

## 2014-11-09 MED FILL — Phenylephrine-NaCl Pref Syr 0.4 MG/10ML-0.9% (40 MCG/ML): INTRAVENOUS | Qty: 10 | Status: AC

## 2014-11-09 MED FILL — Ondansetron HCl Inj 4 MG/2ML (2 MG/ML): INTRAMUSCULAR | Qty: 2 | Status: AC

## 2014-11-09 MED FILL — Succinylcholine Chloride Inj 20 MG/ML: INTRAMUSCULAR | Qty: 6 | Status: AC

## 2014-11-09 MED FILL — Glycopyrrolate Inj 0.2 MG/ML: INTRAMUSCULAR | Qty: 2 | Status: AC

## 2014-11-09 MED FILL — Lidocaine HCl IV Inj 20 MG/ML: INTRAVENOUS | Qty: 5 | Status: AC

## 2014-11-09 MED FILL — Propofol IV Emul 200 MG/20ML (10 MG/ML): INTRAVENOUS | Qty: 20 | Status: AC

## 2014-11-13 ENCOUNTER — Encounter: Payer: Self-pay | Admitting: Cardiovascular Disease

## 2014-11-19 ENCOUNTER — Other Ambulatory Visit: Payer: Self-pay | Admitting: Internal Medicine

## 2014-11-21 ENCOUNTER — Ambulatory Visit (HOSPITAL_COMMUNITY)
Admission: RE | Admit: 2014-11-21 | Discharge: 2014-11-21 | Disposition: A | Discharge status: Home / Self Care | Payer: Medicare Other | Source: Ambulatory Visit | Attending: Cardiovascular Disease | Admitting: Cardiovascular Disease

## 2014-11-21 ENCOUNTER — Telehealth: Payer: Self-pay | Admitting: Cardiovascular Disease

## 2014-11-21 DIAGNOSIS — R079 Chest pain, unspecified: Secondary | ICD-10-CM

## 2014-11-21 DIAGNOSIS — R931 Abnormal findings on diagnostic imaging of heart and coronary circulation: Secondary | ICD-10-CM

## 2014-11-21 NOTE — Telephone Encounter (Signed)
Patient returning call. Informed patient about MUGA scan and the instructions. Patient verbalized understanding. Will send message to [facility] for scheduling.

## 2014-11-21 NOTE — Telephone Encounter (Signed)
Would schedule her for MUGA scan in our nuclear department

## 2014-11-21 NOTE — Telephone Encounter (Signed)
Patient was unable to having her cardiac MRI today due to claustrophobia. Patient wants to know what should she do now. Informed patient that I would send message to Dr. Johnsie Cancel and his nurse Altha Harm for further advisement.

## 2014-11-21 NOTE — Telephone Encounter (Signed)
MUGA scan ordered. Left message for patient to call back.

## 2014-11-21 NOTE — Telephone Encounter (Signed)
New message    Patient calling having a cardiac mri @ noon.     Patient is claustrophobic   Please advise on medication or next step.

## 2014-11-24 ENCOUNTER — Other Ambulatory Visit: Payer: Self-pay | Admitting: Internal Medicine

## 2014-11-26 ENCOUNTER — Other Ambulatory Visit: Payer: Self-pay

## 2014-11-26 ENCOUNTER — Telehealth (HOSPITAL_COMMUNITY): Payer: Self-pay

## 2014-11-26 MED ORDER — METOPROLOL TARTRATE 50 MG PO TABS: 50.0000 mg | ORAL_TABLET | Freq: Two times a day (BID) | ORAL | Status: DC

## 2014-11-26 NOTE — Telephone Encounter (Signed)
Patient given detailed instructions per [place] for test on 11-29-2014 at 2:00pm. Patient Notified to arrive 15 minutes early, and that it is imperative to arrive on time for appointment to keep from having the test rescheduled. Patient verbalized understanding. Oletta Lamas, Mandeep Kiser A

## 2014-11-27 ENCOUNTER — Other Ambulatory Visit (INDEPENDENT_AMBULATORY_CARE_PROVIDER_SITE_OTHER): Payer: Medicare Other | Admitting: *Deleted

## 2014-11-27 DIAGNOSIS — E876 Hypokalemia: Secondary | ICD-10-CM | POA: Diagnosis not present

## 2014-11-27 LAB — BASIC METABOLIC PANEL
BUN: 12 mg/dL (ref 6–23)
CALCIUM: 8.4 mg/dL (ref 8.4–10.5)
CO2: 30 meq/L (ref 19–32)
Chloride: 102 mEq/L (ref 96–112)
Creatinine, Ser: 0.61 mg/dL (ref 0.40–1.20)
GFR: 105.67 mL/min (ref >60.00)
GLUCOSE: 162 mg/dL — AB (ref 70–99)
Potassium: 4.3 mEq/L (ref 3.5–5.1)
SODIUM: 140 meq/L (ref 135–145)

## 2014-11-29 ENCOUNTER — Ambulatory Visit (HOSPITAL_COMMUNITY): Payer: Medicare Other | Attending: Cardiovascular Disease

## 2014-11-29 DIAGNOSIS — R079 Chest pain, unspecified: Secondary | ICD-10-CM | POA: Insufficient documentation

## 2014-11-29 MED ORDER — TECHNETIUM TC 99M-LABELED RED BLOOD CELLS IV KIT
27.6000 | PACK | Freq: Once | INTRAVENOUS | Status: AC | PRN
  Administered 2014-11-29: 28 via INTRAVENOUS

## 2014-11-30 ENCOUNTER — Telehealth: Payer: Self-pay

## 2014-11-30 ENCOUNTER — Other Ambulatory Visit: Payer: Self-pay | Admitting: Internal Medicine

## 2014-11-30 MED ORDER — METOPROLOL TARTRATE 50 MG PO TABS: ORAL_TABLET | ORAL | Status: DC

## 2014-11-30 NOTE — Telephone Encounter (Signed)
Done hardcopy to Dahlia  

## 2014-11-30 NOTE — Telephone Encounter (Signed)
rx corrected - done erx

## 2014-11-30 NOTE — Telephone Encounter (Signed)
Rx faxed to pharmacy  

## 2014-11-30 NOTE — Telephone Encounter (Signed)
Pharmacy called requesting clarification on Metoprolol Rx. Pt was previously taking 1 tab BID but believed that MD wanted to increase to 1 1/2 tab BID, please advise

## 2014-12-21 ENCOUNTER — Ambulatory Visit (INDEPENDENT_AMBULATORY_CARE_PROVIDER_SITE_OTHER): Payer: Medicare Other | Admitting: Sports Medicine

## 2014-12-21 VITALS — BP 183/85 | Ht 62.0 in | Wt 250.0 lb

## 2014-12-21 DIAGNOSIS — M129 Arthropathy, unspecified: Secondary | ICD-10-CM

## 2014-12-21 DIAGNOSIS — M25562 Pain in left knee: Secondary | ICD-10-CM | POA: Diagnosis not present

## 2014-12-21 DIAGNOSIS — M1710 Unilateral primary osteoarthritis, unspecified knee: Secondary | ICD-10-CM

## 2014-12-21 DIAGNOSIS — M25561 Pain in right knee: Secondary | ICD-10-CM

## 2014-12-21 DIAGNOSIS — M25511 Pain in right shoulder: Secondary | ICD-10-CM

## 2014-12-21 DIAGNOSIS — M25512 Pain in left shoulder: Secondary | ICD-10-CM | POA: Diagnosis not present

## 2014-12-21 MED ORDER — METHYLPREDNISOLONE ACETATE 40 MG/ML IJ SUSP
40.0000 mg | Freq: Once | INTRAMUSCULAR | Status: AC
  Administered 2014-12-21: 40 mg via INTRA_ARTICULAR

## 2014-12-21 MED ORDER — METHYLPREDNISOLONE ACETATE 40 MG/ML IJ SUSP
40.0000 mg | Freq: Once | INTRAMUSCULAR | Status: AC
Start: 2014-12-21 — End: 2014-12-21
  Administered 2014-12-21: 40 mg via INTRA_ARTICULAR

## 2014-12-21 NOTE — Progress Notes (Signed)
   Subjective:    Patient ID: Andrea Perry, female    DOB: 19-Jan-1951, 64 y.o.   MRN: [id number]  HPI chief complaint: Right shoulder pain and left knee pain  Patient comes in today complaining of 2 weeks of anterior lateral right shoulder pain. No trauma. Gradual onset of worsening pain which is worse with reaching overhead or away from her body. She's also getting some painful catching and popping in the shoulder. No numbness or tingling. No neck pain. She has not noticed any weakness. She is having some nighttime pain. She denies problems with this shoulder in the past. She is also complaining of returning left knee pain. She has a documented history of left knee DJD. Cortisone injections have been effective for her pain in the past. She understands that definitive treatment is a total knee arthroplasty. She denies any new trauma to the knee.    Review of Systems     Objective:   Physical Exam Obese. No acute distress. Awake alert and oriented 3. Vital signs reviewed  Right shoulder: Full range of motion with a positive painful ARC. No gross deformity. No tenderness to palpation. Positive empty can, positive Hawkins. Rotator cuff strength is 5/5 but reproducible pain with resisted supraspinatus. No tenderness over the bicipital groove. Neurovascularly intact distally.  Left knee: Range of motion is 0-120. 1+ boggy synovitis. She is tender to palpation along the medial joint line with pain but no popping with McMurray's. No tenderness along the lateral joint line. Good ligamentous stability. Neurovascularly intact distally. Walking with a slight limp.       Assessment & Plan:  Right shoulder pain likely secondary to subacromial bursitis/rotator cuff tendinitis Left knee pain secondary to DJD  For both the right shoulder and the left knee I have recommended cortisone injections. The patient agrees. Right subacromial space was injected without difficulty. Left knee was injected using an  anterior medial approach. It was a little difficult getting into the joint space using this approach and if future injections are to be considered I would suggest taking an anterior lateral approach. I've provided her with a compression wrap to wear with activity. Follow-up as needed.  Consent obtained and verified. Time-out conducted. Noted no overlying erythema, induration, or other signs of local infection. Skin prepped in a sterile fashion. Topical analgesic spray: Ethyl chloride. Joint: right shoulder (subacromial) Needle: 25g 1.5 inch Completed without difficulty. Meds: 3cc 1% xylocaine, 1cc (40mg ) depomedrol  Advised to call if fevers/chills, erythema, induration, drainage, or persistent bleeding.   Consent obtained and verified. Time-out conducted. Noted no overlying erythema, induration, or other signs of local infection. Skin prepped in a sterile fashion. Topical analgesic spray: Ethyl chloride. Joint: left knee Needle: 22g 1.5 inch Completed without difficulty. Meds: 3cc 1% xylocaine, 1cc (40mg ) depomedrol  Advised to call if fevers/chills, erythema, induration, drainage, or persistent bleeding.

## 2014-12-26 ENCOUNTER — Ambulatory Visit: Payer: Medicare Other | Admitting: Sports Medicine

## 2015-01-15 ENCOUNTER — Encounter: Payer: Self-pay | Admitting: Cardiovascular Disease

## 2015-01-15 NOTE — Progress Notes (Signed)
Patient ID: MAILLE HALLIWELL, female   DOB: 1950-05-07, 64 y.o.   MRN: [id number] 64 y.o.  referred by Dr Noemi Chapel for preop clearance 11/15   She has significant arthritis of both knees. Previous left knee surgery 1.5 years ago with no complications. She has severe COPD. Primary is Dr Jenny Reichmann. No documented CAD. She described chest pain intermitant not always exertional Central with radiation to left. CRF HTN and elevated lipids on Rx She was seen by pulmonary and started on inhalers Dr Lamonte Sakai felt moderate risk for surgery FEV1 59% predicted no desats Also felt deconditioning had some to do with her dyspnea. Today she was noted to be in rapid afib. No history of but does get palpitations No contraindications to anticoagulation Her knee surgery is elective She has noted increasing dyspnea over the last 6-8 months No cough , mild wheezing   Echo 10/26 ok   Study Conclusions  - Left ventricle: Inferobasal hypokinesis. The cavity size was normal. Wall thickness was normal. Systolic function was normal. The estimated ejection fraction was in the range of 50% to 55%. - Left atrium: The atrium was mildly dilated. - Atrial septum: No defect or patent foramen ovale was identified. - Pulmonary arteries: PA peak pressure: 41 mm Hg (S).   She had more joint pain and xarelto changed to eliquis. Failed [place] on 03/20/14 started on flecainide Unable to do ETT due to arthritis Lexiscan  04/2014 no ischemia or infarction.   10/30/14 Had [place] 120/200J shocked twice.  -> sinus bradycardia rate 52 [place] 11/29/14  EF 52%     ROS: Denies fever, malais, weight loss, blurry vision, decreased visual acuity, cough, sputum, SOB, hemoptysis, pleuritic pain, palpitaitons, heartburn, abdominal pain, melena, lower extremity edema, claudication, or rash.  All other systems reviewed and negative  General: Affect appropriate Healthy:  appears stated age 46: normal Neck supple with no adenopathy JVP normal no  bruits no thyromegaly Lungs clear with no wheezing and good diaphragmatic motion Heart:  S1/S2 no murmur, no rub, gallop or click PMI normal Abdomen: benighn, BS positve, no tenderness, no AAA no bruit.  No HSM or HJR Distal pulses intact with no bruits No edema Neuro non-focal Skin warm and dry Firm soft tissue mass right forearm    Current Outpatient Prescriptions  Medication Sig Dispense Refill  . acetaminophen (TYLENOL) 650 MG CR tablet Take 1,300 mg by mouth 2 (two) times daily.    Marland Kitchen albuterol (PROVENTIL HFA;VENTOLIN HFA) 108 (90 BASE) MCG/ACT inhaler Inhale 2 puffs into the lungs every 6 (six) hours as needed for wheezing or shortness of breath.    Marland Kitchen b complex vitamins tablet Take 1 tablet by mouth daily.    . benzonatate (TESSALON) 100 MG capsule TAKE ONE TO TWO CAPSULES BY MOUTH EVERY 6 HOURS AS NEEDED FOR COUGH. 60 capsule 0  . BLACK COHOSH EXTRACT PO Take 40 mg by mouth daily.    . cetirizine (ZYRTEC) 10 MG tablet Take 10 mg by mouth daily.     . cholecalciferol (VITAMIN D) 1000 UNITS tablet Take 1,000 Units by mouth daily.    Mariane Baumgarten Calcium (STOOL SOFTENER PO) Take 1 tablet by mouth daily as needed (constipation).     Marland Kitchen ELIQUIS 5 MG TABS tablet TAKE ONE TABLET BY MOUTH TWICE DAILY. 60 tablet 11  . escitalopram (LEXAPRO) 20 MG tablet Take 1 tablet (20 mg total) by mouth daily. 90 tablet 3  . estradiol (ESTRACE) 2 MG tablet TAKE ONE TABLET BY MOUTH ONCE  DAILY 90 tablet 3  . flecainide (TAMBOCOR) 50 MG tablet Take 1 tablet (50 mg total) by mouth 2 (two) times daily. 180 tablet 3  . Fluticasone-Salmeterol (ADVAIR DISKUS) 500-50 MCG/DOSE AEPB Inhale 1 puff into the lungs 2 (two) times daily. 60 each 11  . furosemide (LASIX) 40 MG tablet Take 1 tablet (40 mg total) by mouth daily. 90 tablet 3  . Garlic 8875 MG CAPS Take 1 capsule by mouth at bedtime.     . GuaiFENesin (MUCUS RELIEF ADULT PO) Take 1 tablet by mouth daily as needed (mucus).     . irbesartan (AVAPRO) 150 MG  tablet TAKE ONE TABLET BY MOUTH ONCE DAILY 90 tablet 3  . Loperamide HCl (ANTI-DIARRHEAL PO) Take 1 tablet by mouth daily as needed (upset stomach).     . LORazepam (ATIVAN) 1 MG tablet TAKE ONE TABLET BY MOUTH AT BEDTIME AS NEEDED FOR ANXIETY 30 tablet 5  . metoprolol (LOPRESSOR) 50 MG tablet TAKE ONE and ONE  HALF TABLETS (1 AND 1/2) BY MOUTH TWICE DAILY 90 tablet 5  . Multiple Vitamin (MULTIVITAMIN) tablet Take 1 tablet by mouth daily.      . Omega-3 Fatty Acids (FISH OIL) 1200 MG CAPS Take 1 capsule by mouth daily.     . pantoprazole (PROTONIX) 40 MG tablet TAKE ONE TABLET BY MOUTH ONCE DAILY 90 tablet 3  . potassium chloride SA (K-DUR,KLOR-CON) 20 MEQ tablet Take 1 tablet (20 mEq total) by mouth daily. 90 tablet 3  . pravastatin (PRAVACHOL) 40 MG tablet TAKE ONE TABLET BY MOUTH ONCE DAILY 90 tablet 3   No current facility-administered medications for this visit.    Allergies  Codeine; Tramadol hcl; and Vicodin  Electrocardiogram: afib nonspecific ST/T wave changes 03/20/14  04/17/14  afib rate 96 nonspecific ST changes no change from prevoius   10/26/14  Afib rate 102  QT 392/510  Assessment and Plan Afib:  Successful [place] on flecainide 11/13/14 continue with eliquis HTN:    Borderline control f/u outpatient readings consider adding ARB Chol:   Cholesterol is at goal.  Continue current dose of statin and diet Rx.  No myalgias or side effects.  F/U  LFT's in 6 months. Lab Results  Component Value Date   LDLCALC 50 04/04/2014           Arm:  Soft tissue mass: MRI no mass ? Lipoma blending with subcutaneous tissue  F/U with me in 3 months to go over BP readings    Jenkins Rouge

## 2015-01-17 ENCOUNTER — Encounter: Payer: Self-pay | Admitting: Cardiovascular Disease

## 2015-01-17 ENCOUNTER — Ambulatory Visit (INDEPENDENT_AMBULATORY_CARE_PROVIDER_SITE_OTHER): Payer: Medicare Other | Admitting: Cardiovascular Disease

## 2015-01-17 VITALS — BP 150/80 | HR 56 | Ht 62.0 in | Wt 256.0 lb

## 2015-01-17 DIAGNOSIS — I4891 Unspecified atrial fibrillation: Secondary | ICD-10-CM | POA: Diagnosis not present

## 2015-01-17 DIAGNOSIS — I10 Essential (primary) hypertension: Secondary | ICD-10-CM

## 2015-01-17 NOTE — Patient Instructions (Signed)
Medication Instructions:  Your physician recommends that you continue on your current medications as directed. Please refer to the Current Medication list given to you today.  Labwork: NONE  Testing/Procedures: NONE Follow-Up: Your physician recommends that you schedule a follow-up appointment in: 3 MONTHS  WITH DR Johnsie Cancel Any Other Special Instructions Will Be Listed Below (If Applicable).

## 2015-01-25 ENCOUNTER — Other Ambulatory Visit: Payer: Self-pay | Admitting: Internal Medicine

## 2015-02-11 ENCOUNTER — Encounter: Payer: Self-pay | Admitting: Sports Medicine

## 2015-02-11 ENCOUNTER — Ambulatory Visit (INDEPENDENT_AMBULATORY_CARE_PROVIDER_SITE_OTHER): Payer: Medicare Other | Admitting: Sports Medicine

## 2015-02-11 VITALS — BP 181/85 | HR 67 | Ht 62.0 in | Wt 256.0 lb

## 2015-02-11 DIAGNOSIS — M1710 Unilateral primary osteoarthritis, unspecified knee: Secondary | ICD-10-CM

## 2015-02-11 DIAGNOSIS — M25511 Pain in right shoulder: Secondary | ICD-10-CM

## 2015-02-11 DIAGNOSIS — M129 Arthropathy, unspecified: Secondary | ICD-10-CM | POA: Diagnosis not present

## 2015-02-11 NOTE — Progress Notes (Signed)
   Subjective:    Patient ID: Andrea Perry, female    DOB: 1950-07-18, 64 y.o.   MRN: [id number]  HPI   Patient comes in today for follow-up on right shoulder pain and left knee pain. Both are still present but tolerable. She is also complaining of some low back pain. She has a documented history of lumbar degenerative disc disease which has been diagnosed at [facility]. Her right shoulder pain is primarily in the posterior shoulder. Worse with activity. The knee pain is more chronic. She has a documented history of medial compartmental DJD. She takes extra strength Tylenol 2 pills twice daily and that does help to dull the pain.    Review of Systems    as above Objective:   Physical Exam  Well-developed, well-nourished. No acute distress. Awake alert and oriented 3. Vital signs reviewed.  Right shoulder: Full range of motion. No tenderness to palpation. Mildly positive empty can. Mild positive Hawkins. Rotator cuff strength is 5/5. Neurovascular intact distally  Left knee: Range of motion 0-120. 1+ boggy synovitis. Good joint stability.      Assessment & Plan:  Right shoulder pain likely secondary to subacromial bursitis/rotator cuff tendinitis Left knee pain secondary to medial compartment DJD Chronic low back pain secondary to lumbar degenerative disc disease  I had a long talk with the patient regarding all 3 of these diagnoses. I discussed physical therapy for the right shoulder but she would like to hold on that for now. I also discussed the possibility of a medial unloader for the left knee but she understands that definitive treatment is a total knee arthroplasty. She has had x-rays and MRI scans of her lumbar spine previously. She is not getting any true radiculopathy. Patient has multiple medical comorbidities which makes treating all these problems a challenge. I think Tylenol is probably the best medication for her at this point in time. I could reinject her  left knee at a later date if need be. We also discussed the possibility of a 6 day Sterapred Dosepak in the future if needed. She has done well with these in the past. I've encouraged her to try to stay as active as possible. Follow-up with me as needed.

## 2015-02-22 ENCOUNTER — Other Ambulatory Visit: Payer: Self-pay | Admitting: Internal Medicine

## 2015-03-04 NOTE — Progress Notes (Signed)
ERROR

## 2015-03-08 ENCOUNTER — Encounter: Payer: Self-pay | Admitting: Emergency Medicine

## 2015-03-08 ENCOUNTER — Ambulatory Visit (INDEPENDENT_AMBULATORY_CARE_PROVIDER_SITE_OTHER): Payer: Medicare Other | Admitting: Emergency Medicine

## 2015-03-08 VITALS — BP 140/90 | HR 59 | Ht 62.0 in | Wt 261.0 lb

## 2015-03-08 DIAGNOSIS — R053 Chronic cough: Secondary | ICD-10-CM

## 2015-03-08 DIAGNOSIS — Z23 Encounter for immunization: Secondary | ICD-10-CM | POA: Diagnosis not present

## 2015-03-08 DIAGNOSIS — J309 Allergic rhinitis, unspecified: Secondary | ICD-10-CM

## 2015-03-08 DIAGNOSIS — R05 Cough: Secondary | ICD-10-CM | POA: Diagnosis not present

## 2015-03-08 DIAGNOSIS — J439 Emphysema, unspecified: Secondary | ICD-10-CM | POA: Diagnosis not present

## 2015-03-08 DIAGNOSIS — R059 Cough, unspecified: Secondary | ICD-10-CM | POA: Insufficient documentation

## 2015-03-08 NOTE — Assessment & Plan Note (Signed)
Chronic asthma plus COPD. Currently managed on Advair 500/50 and albuterol when necessary. No exacerbations. She does continue to have exertional dyspnea and daily cough. I'd like to treat her allergic rhinitis more aggressively, continue her current regimen. Flu Shot today  Please continue Advair 500/50, one puff twice a day Use albuterol 2 puffs up to every 4 hours if needed for shortness of breath Continue Zyrtec every day Try starting fluticasone nasal spray, 2 sprays each nostril once a day during the allergy season to see if this helps with your congestion and cough.  Follow with Dr Lamonte Sakai in 6 months or sooner if you have any problems

## 2015-03-08 NOTE — Assessment & Plan Note (Signed)
Likely multifactorial with some contribution of her underlying asthma. Suspect the biggest contributors this time is her marginally controlled allergic rhinitis. Also note that her powdered Advair could be a contributor. She was started on an ARB a few months ago and although this coincided with the increase in her cough doubt that this is a large contributor. The ARB most associated with cough his losartan

## 2015-03-08 NOTE — Patient Instructions (Signed)
Please continue Advair 500/50, one puff twice a day Use albuterol 2 puffs up to every 4 hours if needed for shortness of breath Continue Zyrtec every day Try starting fluticasone nasal spray, 2 sprays each nostril once a day during the allergy season to see if this helps with your congestion and cough.  Follow with Dr Lamonte Sakai in 6 months or sooner if you have any problems

## 2015-03-08 NOTE — Assessment & Plan Note (Signed)
Continue Zyrtec Add fluticasone nasal spray during the allergy season to see if she benefits

## 2015-03-08 NOTE — Progress Notes (Signed)
Subjective:    Patient ID: Andrea Perry, female    DOB: 07/05/1950, 64 y.o.   MRN: [id number]  HPI 64 yo woman, hx of childhood asthma, hx tobacco (45 pk-yrs), allergies, GERD, carries a dx of COPD and adult asthma made by Dr Jenny Reichmann. She has worked in a [employer] and had to have spirometry as a part of her job. She is referred today for her COPD and also for pre-op eval for L knee surgery w Dr Noemi Chapel. She describes some exertional dyspnea, happens after 32ft. She has some occasional cough, non-productive. She sometimes hears wheeze. She has had episodes of light-headedness when walking an extended distance.   ROV 02/14/14 -- follow-up visit for COPD and history of childhood asthma. She was also recently diagnosed with atrial fibrillation - on eliquis and metoprolol. Last visit we added Spiriva to her Advair to see if she will benefit. She believed that she benefited from the [place], but note that she was having work done on her A fib and rate control at the same time.   ROV 08/28/14 -- Follow-up visit for COPD and a history of asthma. She described variably in her breathing, good days and bad days. She has gained about 10 lbs. She has exertional SOB, especially w housework but not every time. She is on Advair bid, ventolin prn > averages 3x a week. She doesn't seem to miss spiriva. She has occasional coughing. She remains on zyrtec and omeprazole   ROV 03/08/15 -- follow-up visit for asthmatic COPD in the setting of prior tobacco use, cotton dust exposure and also childhood asthma. She is currently managed on Advair 500/50 twice a day, albuterol when necessary which she uses about 3-4x a weeks. She's also been seen by Dr. Johnsie Cancel for hypertension and atrial fibrillation, cardioverted in July. She has stayed in NSR on flecainide. She still has some exertional dyspnea on some days, not all. Notices with chores. She has a new cough that started  A few months ago - note avapro added at that time. She is  sneezing some, on zyrtec.    Review of Systems  Constitutional: Negative for fever and unexpected weight change.  HENT: Negative for congestion, dental problem, ear pain, nosebleeds, postnasal drip, rhinorrhea, sinus pressure, sneezing, sore throat and trouble swallowing.   Eyes: Negative for redness and itching.  Respiratory: Positive for cough and shortness of breath. Negative for chest tightness and wheezing.   Cardiovascular: Negative for palpitations and leg swelling.  Gastrointestinal: Negative for nausea and vomiting.  Genitourinary: Negative for dysuria.  Musculoskeletal: Negative for joint swelling.  Skin: Negative for rash.  Neurological: Negative for headaches.  Hematological: Does not bruise/bleed easily.  Psychiatric/Behavioral: Negative for dysphoric mood. The patient is not nervous/anxious.        Objective:   Physical Exam Filed Vitals:   03/08/15 1215  BP: 140/90  Pulse: 59  Height: 5\' 2"  (1.575 m)  Weight: 261 lb (118.389 kg)  SpO2: 95%   Gen: Pleasant, well-nourished, in no distress,  normal affect  ENT: No lesions,  mouth clear,  oropharynx clear, no postnasal drip  Neck: No JVD, no TMG, no carotid bruits  Lungs: No use of accessory muscles, no dullness to percussion, clear without rales or rhonchi  Cardiovascular: RRR, heart sounds normal, no murmur or gallops, no peripheral edema  Musculoskeletal: No deformities, no cyanosis or clubbing  Neuro: alert, non focal  Skin: Warm, no lesions or rashes      Assessment &  Plan:  COPD (chronic obstructive pulmonary disease) Chronic asthma plus COPD. Currently managed on Advair 500/50 and albuterol when necessary. No exacerbations. She does continue to have exertional dyspnea and daily cough. I'd like to treat her allergic rhinitis more aggressively, continue her current regimen. Flu Shot today  Please continue Advair 500/50, one puff twice a day Use albuterol 2 puffs up to every 4 hours if needed for  shortness of breath Continue Zyrtec every day Try starting fluticasone nasal spray, 2 sprays each nostril once a day during the allergy season to see if this helps with your congestion and cough.  Follow with Dr Lamonte Sakai in 6 months or sooner if you have any problems  Allergic rhinitis Continue Zyrtec Add fluticasone nasal spray during the allergy season to see if she benefits  Chronic cough Likely multifactorial with some contribution of her underlying asthma. Suspect the biggest contributors this time is her marginally controlled allergic rhinitis. Also note that her powdered Advair could be a contributor. She was started on an ARB a few months ago and although this coincided with the increase in her cough doubt that this is a large contributor. The ARB most associated with cough his losartan

## 2015-03-12 ENCOUNTER — Other Ambulatory Visit: Payer: Self-pay | Admitting: Cardiovascular Disease

## 2015-03-25 NOTE — Progress Notes (Signed)
Patient ID: Andrea Perry, female   DOB: [birth year], 64 y.o.   MRN: [id number]   64 y.o.  referred by Dr Noemi Chapel for preop clearance 11/15   She has significant arthritis of both knees. Previous left knee surgery 1.5 years ago with no complications. She has severe COPD. Primary is Dr Jenny Reichmann. No documented CAD. She described chest pain intermitant not always exertional Central with radiation to left. CRF HTN and elevated lipids on Rx She was seen by pulmonary and started on inhalers Dr Lamonte Sakai felt moderate risk for surgery FEV1 59% predicted no desats Also felt deconditioning had some to do with her dyspnea. Today she was noted to be in rapid afib. No history of but does get palpitations No contraindications to anticoagulation Her knee surgery is elective She has noted increasing dyspnea over the last 6-8 months No cough , mild wheezing   Echo 10/26 ok   Study Conclusions  - Left ventricle: Inferobasal hypokinesis. The cavity size was normal. Wall thickness was normal. Systolic function was normal. The estimated ejection fraction was in the range of 50% to 55%. - Left atrium: The atrium was mildly dilated. - Atrial septum: No defect or patent foramen ovale was identified. - Pulmonary arteries: PA peak pressure: 41 mm Hg (S).   She had more joint pain and xarelto changed to eliquis. Failed [place] on 03/20/14 started on flecainide Unable to do ETT due to arthritis Lexiscan  04/2014 no ischemia or infarction.   10/30/14 Had [place] 120/200J shocked twice.  -> sinus bradycardia rate 52 [place] 11/29/14  EF 52%    BP running high at home as well Has clinical sleep apnea and needs study   ROS: Denies fever, malais, weight loss, blurry vision, decreased visual acuity, cough, sputum, SOB, hemoptysis, pleuritic pain, palpitaitons, heartburn, abdominal pain, melena, lower extremity edema, claudication, or rash.  All other systems reviewed and negative  General: Affect appropriate Healthy:   appears stated age 64: normal Neck supple with no adenopathy JVP normal no bruits no thyromegaly Lungs clear with no wheezing and good diaphragmatic motion Heart:  S1/S2 no murmur, no rub, gallop or click PMI normal Abdomen: benighn, BS positve, no tenderness, no AAA no bruit.  No HSM or HJR Distal pulses intact with no bruits No edema Neuro non-focal Skin warm and dry Firm soft tissue mass right forearm    Current Outpatient Prescriptions  Medication Sig Dispense Refill  . acetaminophen (TYLENOL) 650 MG CR tablet Take 1,300 mg by mouth 2 (two) times daily.    Marland Kitchen ADVAIR DISKUS 500-50 MCG/DOSE AEPB INHALE ONE PUFF INTO THE LUNGS TWICE DAILY 60 each 5  . albuterol (PROVENTIL HFA;VENTOLIN HFA) 108 (90 BASE) MCG/ACT inhaler Inhale 2 puffs into the lungs every 6 (six) hours as needed for wheezing or shortness of breath.    Marland Kitchen b complex vitamins tablet Take 1 tablet by mouth daily.    . benzonatate (TESSALON) 100 MG capsule TAKE ONE TO TWO CAPSULES BY MOUTH EVERY 6 HOURS AS NEEDED FOR COUGH. 60 capsule 0  . BLACK COHOSH EXTRACT PO Take 40 mg by mouth daily.    . cetirizine (ZYRTEC) 10 MG tablet Take 10 mg by mouth daily.     . cholecalciferol (VITAMIN D) 1000 UNITS tablet Take 1,000 Units by mouth daily.    Mariane Baumgarten Calcium (STOOL SOFTENER PO) Take 1 tablet by mouth daily as needed (constipation).     Marland Kitchen ELIQUIS 5 MG TABS tablet TAKE ONE TABLET BY MOUTH TWICE DAILY.  60 tablet 11  . escitalopram (LEXAPRO) 20 MG tablet Take 1 tablet (20 mg total) by mouth daily. 90 tablet 3  . estradiol (ESTRACE) 2 MG tablet TAKE ONE TABLET BY MOUTH ONCE DAILY 90 tablet 1  . flecainide (TAMBOCOR) 50 MG tablet TAKE ONE TABLET BY MOUTH TWICE DAILY 180 tablet 1  . furosemide (LASIX) 40 MG tablet Take 1 tablet (40 mg total) by mouth daily. 90 tablet 3  . Garlic 123XX123 MG CAPS Take 1 capsule by mouth at bedtime.     . GuaiFENesin (MUCUS RELIEF ADULT PO) Take 1 tablet by mouth daily as needed (mucus).     .  irbesartan (AVAPRO) 300 MG tablet Take 0.5 tablets (150 mg total) by mouth daily. 90 tablet 3  . Loperamide HCl (ANTI-DIARRHEAL PO) Take 1 tablet by mouth daily as needed (upset stomach).     . LORazepam (ATIVAN) 1 MG tablet TAKE ONE TABLET BY MOUTH AT BEDTIME AS NEEDED FOR ANXIETY 30 tablet 5  . metoprolol (LOPRESSOR) 50 MG tablet TAKE ONE and ONE  HALF TABLETS (1 AND 1/2) BY MOUTH TWICE DAILY 90 tablet 5  . Multiple Vitamin (MULTIVITAMIN) tablet Take 1 tablet by mouth daily.      . Omega-3 Fatty Acids (FISH OIL) 1200 MG CAPS Take 1 capsule by mouth daily.     . pantoprazole (PROTONIX) 40 MG tablet TAKE ONE TABLET BY MOUTH ONCE DAILY 90 tablet 3  . potassium chloride SA (K-DUR,KLOR-CON) 20 MEQ tablet Take 1 tablet (20 mEq total) by mouth daily. 90 tablet 3  . pravastatin (PRAVACHOL) 40 MG tablet TAKE ONE TABLET BY MOUTH ONCE DAILY 90 tablet 3   No current facility-administered medications for this visit.    Allergies  Codeine; Tramadol hcl; and Vicodin  Electrocardiogram: afib nonspecific ST/T wave changes 03/20/14  04/17/14  afib rate 96 nonspecific ST changes no change from prevoius   10/26/14  Afib rate 102  QT 392/510  Assessment and Plan Afib:  Successful [place] on flecainide 11/13/14 continue with eliquis HTN:    Increase avapro to 300 mg daily in addition to lopressor and diuretic  Chol:   Cholesterol is at goal.  Continue current dose of statin and diet Rx.  No myalgias or side effects.  F/U  LFT's in 6 months. Lab Results  Component Value Date   LDLCALC 50 04/04/2014   OSA:  Ordered sleep study and f/u with Dr Radford Pax will decrease recurrence rate of afib         Arm:  Soft tissue mass: MRI no mass ? Lipoma blending with subcutaneous tissue  F/U with me in 6 months   Jenkins Rouge

## 2015-03-28 ENCOUNTER — Ambulatory Visit (INDEPENDENT_AMBULATORY_CARE_PROVIDER_SITE_OTHER): Payer: Medicare Other | Admitting: Cardiovascular Disease

## 2015-03-28 ENCOUNTER — Encounter: Payer: Self-pay | Admitting: Cardiovascular Disease

## 2015-03-28 VITALS — BP 160/96 | HR 65 | Ht 62.0 in | Wt 259.8 lb

## 2015-03-28 DIAGNOSIS — I4891 Unspecified atrial fibrillation: Secondary | ICD-10-CM | POA: Diagnosis not present

## 2015-03-28 DIAGNOSIS — Z79899 Other long term (current) drug therapy: Secondary | ICD-10-CM

## 2015-03-28 MED ORDER — IRBESARTAN 300 MG PO TABS: 150.0000 mg | ORAL_TABLET | Freq: Every day | ORAL | Status: DC

## 2015-03-28 NOTE — Patient Instructions (Addendum)
Medication Instructions:  Your physician has recommended you make the following change in your medication:   1-Increase Avapro 300 mg by mouth daily.  Labwork: NONE  Testing/Procedures: NONE  Follow-Up: Your physician recommends that you schedule a follow-up appointment with Dr. Radford Pax for sleep study.  Your physician wants you to follow-up in: 6 months with Dr. Johnsie Cancel. You will receive a reminder letter in the mail two months in advance. If you don't receive a letter, please call our office to schedule the follow-up appointment.   If you need a refill on your cardiac medications before your next appointment, please call your pharmacy.

## 2015-04-05 ENCOUNTER — Ambulatory Visit: Payer: Medicare Other | Admitting: Internal Medicine

## 2015-04-08 ENCOUNTER — Ambulatory Visit: Payer: Medicare Other | Admitting: Cardiovascular Disease

## 2015-04-16 ENCOUNTER — Ambulatory Visit: Payer: Medicare Other | Admitting: Internal Medicine

## 2015-04-18 ENCOUNTER — Ambulatory Visit (INDEPENDENT_AMBULATORY_CARE_PROVIDER_SITE_OTHER): Payer: Medicare Other | Admitting: Internal Medicine

## 2015-04-18 ENCOUNTER — Other Ambulatory Visit (INDEPENDENT_AMBULATORY_CARE_PROVIDER_SITE_OTHER): Payer: Medicare Other

## 2015-04-18 ENCOUNTER — Encounter: Payer: Self-pay | Admitting: Internal Medicine

## 2015-04-18 VITALS — BP 146/92 | HR 55 | Temp 98.0°F | Ht 62.0 in | Wt 263.0 lb

## 2015-04-18 DIAGNOSIS — E755 Other lipid storage disorders: Secondary | ICD-10-CM

## 2015-04-18 DIAGNOSIS — E119 Type 2 diabetes mellitus without complications: Secondary | ICD-10-CM | POA: Diagnosis not present

## 2015-04-18 DIAGNOSIS — R109 Unspecified abdominal pain: Secondary | ICD-10-CM

## 2015-04-18 DIAGNOSIS — Z0189 Encounter for other specified special examinations: Secondary | ICD-10-CM | POA: Diagnosis not present

## 2015-04-18 DIAGNOSIS — IMO0001 Reserved for inherently not codable concepts without codable children: Secondary | ICD-10-CM

## 2015-04-18 DIAGNOSIS — I10 Essential (primary) hypertension: Secondary | ICD-10-CM | POA: Diagnosis not present

## 2015-04-18 DIAGNOSIS — Z0000 Encounter for general adult medical examination without abnormal findings: Secondary | ICD-10-CM

## 2015-04-18 LAB — HEPATIC FUNCTION PANEL
ALBUMIN: 3.5 g/dL (ref 3.5–5.2)
ALK PHOS: 84 U/L (ref 39–117)
ALT: 21 U/L (ref 0–35)
AST: 21 U/L (ref 0–37)
Bilirubin, Direct: 0.1 mg/dL (ref 0.0–0.3)
TOTAL PROTEIN: 6.5 g/dL (ref 6.0–8.3)
Total Bilirubin: 0.5 mg/dL (ref 0.2–1.2)

## 2015-04-18 LAB — CBC WITH DIFFERENTIAL/PLATELET
BASOS ABS: 0 10*3/uL (ref 0.0–0.1)
Basophils Relative: 0.2 % (ref 0.0–3.0)
EOS ABS: 0.2 10*3/uL (ref 0.0–0.7)
EOS PCT: 2 % (ref 0.0–5.0)
HCT: 39.6 % (ref 36.0–46.0)
Hemoglobin: 13 g/dL (ref 12.0–15.0)
LYMPHS ABS: 2.8 10*3/uL (ref 0.7–4.0)
Lymphocytes Relative: 24.6 % (ref 12.0–46.0)
MCHC: 32.9 g/dL (ref 30.0–36.0)
MCV: 93.6 fl (ref 78.0–100.0)
MONO ABS: 0.8 10*3/uL (ref 0.1–1.0)
Monocytes Relative: 7.4 % (ref 3.0–12.0)
NEUTROS PCT: 65.8 % (ref 43.0–77.0)
Neutro Abs: 7.4 10*3/uL (ref 1.4–7.7)
PLATELETS: 249 10*3/uL (ref 150.0–400.0)
RBC: 4.23 Mil/uL (ref 3.87–5.11)
RDW: 13.7 % (ref 11.5–15.5)
WBC: 11.2 10*3/uL — ABNORMAL HIGH (ref 4.0–10.5)

## 2015-04-18 LAB — LIPID PANEL
CHOLESTEROL: 144 mg/dL (ref 0–200)
HDL: 36.1 mg/dL — ABNORMAL LOW (ref >39.00)
NonHDL: 108.1
Total CHOL/HDL Ratio: 4
Triglycerides: 250 mg/dL — ABNORMAL HIGH (ref 0.0–149.0)
VLDL: 50 mg/dL — ABNORMAL HIGH (ref 0.0–40.0)

## 2015-04-18 LAB — BASIC METABOLIC PANEL
BUN: 9 mg/dL (ref 6–23)
CO2: 34 meq/L — AB (ref 19–32)
Calcium: 9 mg/dL (ref 8.4–10.5)
Chloride: 99 mEq/L (ref 96–112)
Creatinine, Ser: 0.59 mg/dL (ref 0.40–1.20)
GFR: 109.67 mL/min (ref >60.00)
GLUCOSE: 111 mg/dL — AB (ref 70–99)
POTASSIUM: 4.8 meq/L (ref 3.5–5.1)
SODIUM: 141 meq/L (ref 135–145)

## 2015-04-18 LAB — LIPASE: LIPASE: 21 U/L (ref 7–60)

## 2015-04-18 LAB — HEMOGLOBIN A1C: HEMOGLOBIN A1C: 6.3 % (ref 4.6–6.5)

## 2015-04-18 LAB — LDL CHOLESTEROL, DIRECT: Direct LDL: 75 mg/dL

## 2015-04-18 NOTE — Progress Notes (Signed)
Pre visit review using our clinic review tool, if applicable. No additional management support is needed unless otherwise documented below in the visit note. 

## 2015-04-18 NOTE — Assessment & Plan Note (Addendum)
Epigastric/ruq - ? Etiology, cont PPI, check labs as ordered including cbc with recent dark stools and vomiting, for lft's and also abd u/s r/o GB as well

## 2015-04-18 NOTE — Patient Instructions (Signed)
Please continue all other medications as before, and refills have been done if requested.  Please have the pharmacy call with any other refills you may need.  Please continue your efforts at being more active, low cholesterol diet, and weight control.  You are otherwise up to date with prevention measures today.  Please keep your appointments with your specialists as you may have planned  You will be contacted regarding the referral for: abdomen ultrasound  Please go to the LAB in the Basement (turn left off the elevator) for the tests to be done today  You will be contacted by phone if any changes need to be made immediately.  Otherwise, you will receive a letter about your results with an explanation, but please check with MyChart first.  Please remember to sign up for MyChart if you have not done so, as this will be important to you in the future with finding out test results, communicating by private email, and scheduling acute appointments online when needed.  Please return in 6 months, or sooner if needed, with Lab testing done 3-5 days before

## 2015-04-18 NOTE — Assessment & Plan Note (Signed)
stable overall by history and exam, recent data reviewed with pt, and pt to continue medical treatment as before,  to f/u any worsening symptoms or concerns Lab Results  Component Value Date   HGBA1C 6.2 10/04/2014   For f/u labs

## 2015-04-18 NOTE — Progress Notes (Signed)
Subjective:    Patient ID: Andrea Perry, female    DOB: 1951-03-20, 64 y.o.   MRN: [id number]  HPI Here to f/u; overall doing ok,  Pt denies chest pain, increasing sob or doe, wheezing, orthopnea, PND, increased LE swelling, palpitations, dizziness or syncope.  Pt denies new neurological symptoms such as new headache, or facial or extremity weakness or numbness.  Pt denies polydipsia, polyuria, or low sugar episode.   Pt denies new neurological symptoms such as new headache, or facial or extremity weakness or numbness.   Pt states overall good compliance with meds, mostly trying to follow appropriate diet, with wt overall stable,  but little exercise however.  Did have episode last wk several days with sharp pains to ruq/epigastric with nuasea, occas vomiting, also diarrhea so might have been viral? But concerned about GB.  Also had 2 days of dark stools with out BRB over a wk ago, normal since. Still taking PPI Past Medical History  Diagnosis Date  . Impaired glucose tolerance 09/21/2010  . ALLERGIC RHINITIS 08/16/2008  . ANXIETY 08/16/2008  . ASTHMA 08/16/2008  . COPD 08/16/2008  . DEPRESSION 08/16/2008  . GENITAL HERPES 12/03/2006  . HEMATOCHEZIA 06/20/2009  . HYPERLIPIDEMIA 08/16/2008  . HYPERTENSION 12/03/2006  . IBS 06/20/2009    hx per patient, no current problem  . Hemorrhoids   . HYPOTHYROIDISM 12/03/2006    History only - no current problem  . GERD (gastroesophageal reflux disease)   . H/O hiatal hernia   . Arthritis     hands, knees, lower back  . Chronic lower back pain     problems with disc L2-5  . Dysrhythmia     hx AF  . History of cardioversion 10/30/14   Past Surgical History  Procedure Laterality Date  . Appendectomy    . Knee surgery      Right   . Tonsillectomy and adenoidectomy    . Abdominal hysterectomy    . Carpal tunnel release      Right   . Cesarean section      x 1   . Colonoscopy  2004  . Cystocele repair N/A 10/25/2012    Procedure: ANTERIOR REPAIR  (CYSTOCELE);  Surgeon: Gus Height, MD;  Location: [facility];  Service: Gynecology;  Laterality: N/A;  . Cardioversion N/A 03/20/2014    Procedure: CARDIOVERSION;  Surgeon: Josue Hector, MD;  Location: [facility];  Service: Cardiovascular;  Laterality: N/A;  . Cardioversion N/A 10/30/2014    Procedure: CARDIOVERSION;  Surgeon: Josue Hector, MD;  Location: [facility];  Service: Cardiovascular;  Laterality: N/A;  . Radiology with anesthesia Right 11/01/2014    Procedure: MRI RIGHT FOREARM;  Surgeon: Medication Radiologist, MD;  Location: [facility];  Service: Radiology;  Laterality: Right;    reports that she quit smoking about 12 years ago. Her smoking use included Cigarettes. She has a 45 pack-year smoking history. She has never used smokeless tobacco. She reports that she does not drink alcohol or use illicit drugs. family history includes Colon polyps in her mother; Diabetes in her mother; Hyperlipidemia in her mother; Hypertension in her mother; Stroke in her father. There is no history of Colon cancer. Allergies  Allergen Reactions  . Codeine Nausea And Vomiting  . Tramadol Hcl Nausea And Vomiting    REACTION: mouth dryness, headache  . Vicodin [Hydrocodone-Acetaminophen] Nausea And Vomiting   Current Outpatient Prescriptions on File Prior to Visit  Medication Sig Dispense Refill  . acetaminophen (TYLENOL) 650 MG  CR tablet Take 1,300 mg by mouth 2 (two) times daily.    Marland Kitchen ADVAIR DISKUS 500-50 MCG/DOSE AEPB INHALE ONE PUFF INTO THE LUNGS TWICE DAILY 60 each 5  . albuterol (PROVENTIL HFA;VENTOLIN HFA) 108 (90 BASE) MCG/ACT inhaler Inhale 2 puffs into the lungs every 6 (six) hours as needed for wheezing or shortness of breath.    Marland Kitchen b complex vitamins tablet Take 1 tablet by mouth daily.    Marland Kitchen BLACK COHOSH EXTRACT PO Take 40 mg by mouth daily.    . cetirizine (ZYRTEC) 10 MG tablet Take 10 mg by mouth daily.     . cholecalciferol (VITAMIN D) 1000 UNITS tablet Take 1,000 Units by mouth daily.    Mariane Baumgarten Calcium (STOOL SOFTENER PO) Take 1 tablet by mouth daily as needed (constipation).     Marland Kitchen ELIQUIS 5 MG TABS tablet TAKE ONE TABLET BY MOUTH TWICE DAILY. 60 tablet 11  . escitalopram (LEXAPRO) 20 MG tablet Take 1 tablet (20 mg total) by mouth daily. 90 tablet 3  . estradiol (ESTRACE) 2 MG tablet TAKE ONE TABLET BY MOUTH ONCE DAILY 90 tablet 1  . flecainide (TAMBOCOR) 50 MG tablet TAKE ONE TABLET BY MOUTH TWICE DAILY 180 tablet 1  . furosemide (LASIX) 40 MG tablet Take 1 tablet (40 mg total) by mouth daily. 90 tablet 3  . Garlic 123XX123 MG CAPS Take 1 capsule by mouth at bedtime.     . GuaiFENesin (MUCUS RELIEF ADULT PO) Take 1 tablet by mouth daily as needed (mucus).     . irbesartan (AVAPRO) 300 MG tablet Take 0.5 tablets (150 mg total) by mouth daily. 90 tablet 3  . Loperamide HCl (ANTI-DIARRHEAL PO) Take 1 tablet by mouth daily as needed (upset stomach).     . LORazepam (ATIVAN) 1 MG tablet TAKE ONE TABLET BY MOUTH AT BEDTIME AS NEEDED FOR ANXIETY 30 tablet 5  . metoprolol (LOPRESSOR) 50 MG tablet TAKE ONE and ONE  HALF TABLETS (1 AND 1/2) BY MOUTH TWICE DAILY 90 tablet 5  . Multiple Vitamin (MULTIVITAMIN) tablet Take 1 tablet by mouth daily.      . Omega-3 Fatty Acids (FISH OIL) 1200 MG CAPS Take 1 capsule by mouth daily.     . pantoprazole (PROTONIX) 40 MG tablet TAKE ONE TABLET BY MOUTH ONCE DAILY 90 tablet 3  . potassium chloride SA (K-DUR,KLOR-CON) 20 MEQ tablet Take 1 tablet (20 mEq total) by mouth daily. 90 tablet 3  . pravastatin (PRAVACHOL) 40 MG tablet TAKE ONE TABLET BY MOUTH ONCE DAILY 90 tablet 3  . benzonatate (TESSALON) 100 MG capsule TAKE ONE TO TWO CAPSULES BY MOUTH EVERY 6 HOURS AS NEEDED FOR COUGH. (Patient not taking: Reported on 04/18/2015) 60 capsule 0   No current facility-administered medications on file prior to visit.   Review of Systems  Constitutional: Negative for unusual diaphoresis or night sweats HENT: Negative for ringing in ear or discharge Eyes:  Negative for double vision or worsening visual disturbance.  Respiratory: Negative for choking and stridor.   Gastrointestinal: Negative for vomiting or other signifcant bowel change Genitourinary: Negative for hematuria or change in urine volume.  Musculoskeletal: Negative for other MSK pain or swelling Skin: Negative for color change and worsening wound.  Neurological: Negative for tremors and numbness other than noted  Psychiatric/Behavioral: Negative for decreased concentration or agitation other than above       Objective:   Physical Exam BP 146/92 mmHg  Pulse 55  Temp(Src) 98 F (36.7 C) (  Oral)  Ht 5\' 2"  (1.575 m)  Wt 263 lb (119.296 kg)  BMI 48.09 kg/m2  SpO2 96% VS noted, not ill appearing Constitutional: Pt appears in no significant distress HENT: Head: NCAT.  Right Ear: External ear normal.  Left Ear: External ear normal.  Eyes: . Pupils are equal, round, and reactive to light. Conjunctivae and EOM are normal Neck: Normal range of motion. Neck supple.  Cardiovascular: Normal rate and regular rhythm.   Pulmonary/Chest: Effort normal and breath sounds without rales or wheezing.  Abd:  Soft, NT, ND, + BS except mild epigastric tender Neurological: Pt is alert. Not confused , motor grossly intact Skin: Skin is warm. No rash, no LE edema Psychiatric: Pt behavior is normal. No agitation.     Assessment & Plan:

## 2015-04-18 NOTE — Assessment & Plan Note (Signed)
stable overall by history and exam, recent data reviewed with pt, and pt to continue medical treatment as before,  to f/u any worsening symptoms or concerns BP Readings from Last 3 Encounters:  04/18/15 146/92  03/28/15 160/96  03/08/15 140/90

## 2015-04-19 ENCOUNTER — Encounter: Payer: Self-pay | Admitting: Internal Medicine

## 2015-04-23 ENCOUNTER — Encounter: Payer: Self-pay | Admitting: Internal Medicine

## 2015-04-23 ENCOUNTER — Ambulatory Visit (INDEPENDENT_AMBULATORY_CARE_PROVIDER_SITE_OTHER): Payer: Medicare Other | Admitting: Internal Medicine

## 2015-04-23 VITALS — BP 150/88 | HR 72 | Ht 62.0 in | Wt 263.4 lb

## 2015-04-23 DIAGNOSIS — J438 Other emphysema: Secondary | ICD-10-CM

## 2015-04-23 DIAGNOSIS — R05 Cough: Secondary | ICD-10-CM | POA: Diagnosis not present

## 2015-04-23 DIAGNOSIS — R053 Chronic cough: Secondary | ICD-10-CM

## 2015-04-23 MED ORDER — PREDNISONE 10 MG PO TABS: ORAL_TABLET | ORAL | Status: DC

## 2015-04-23 MED ORDER — METHYLPREDNISOLONE ACETATE 80 MG/ML IJ SUSP
80.0000 mg | Freq: Once | INTRAMUSCULAR | Status: AC
  Administered 2015-04-23: 80 mg via INTRAMUSCULAR

## 2015-04-23 MED ORDER — LEVALBUTEROL HCL 0.63 MG/3ML IN NEBU
0.6300 mg | INHALATION_SOLUTION | Freq: Once | RESPIRATORY_TRACT | Status: AC
  Administered 2015-04-23: 0.63 mg via RESPIRATORY_TRACT

## 2015-04-23 MED ORDER — AZITHROMYCIN 250 MG PO TABS: ORAL_TABLET | ORAL | Status: DC

## 2015-04-23 NOTE — Patient Instructions (Addendum)
Script sent Zpak antibiotic  Nebulizer Xopeniex o.63  Depo 35  Script for prednisone taper printed to hld in case you get worse  Otc comfort meds like Mucinex, "Tylenol cold type products", stay well hydrated and warm   Call to see Dr Lamonte Sakai if needed, as he instructed

## 2015-04-23 NOTE — Assessment & Plan Note (Signed)
Cough much improved. Not clear what has made the difference.

## 2015-04-23 NOTE — Assessment & Plan Note (Signed)
Acute exacerbation of COPD. Likely viral URI with tracheobronchitis. Discussed limits of our ability to treat. Plan-nebulizer treatments Xopenex, prednisone prescription to hold with discussion, Depo-Medrol today. Supportive meds, extra fluids, stay warm and get rest.

## 2015-04-23 NOTE — Progress Notes (Signed)
Subjective:    Patient ID: Andrea Perry, female    DOB: [birth year], 65 y.o.   MRN: [id number]  HPI 65 yo woman, hx of childhood asthma, hx tobacco (45 pk-yrs), allergies, GERD, carries a dx of COPD and adult asthma made by Dr Jenny Reichmann. She has worked in a [employer] and had to have spirometry as a part of her job. She is referred today for her COPD and also for pre-op eval for L knee surgery w Dr Noemi Chapel. She describes some exertional dyspnea, happens after 76ft. She has some occasional cough, non-productive. She sometimes hears wheeze. She has had episodes of light-headedness when walking an extended distance.   ROV 02/14/14 -- follow-up visit for COPD and history of childhood asthma. She was also recently diagnosed with atrial fibrillation - on eliquis and metoprolol. Last visit we added Spiriva to her Advair to see if she will benefit. She believed that she benefited from the [place], but note that she was having work done on her A fib and rate control at the same time.   ROV 08/28/14 -- Follow-up visit for COPD and a history of asthma. She described variably in her breathing, good days and bad days. She has gained about 10 lbs. She has exertional SOB, especially w housework but not every time. She is on Advair bid, ventolin prn > averages 3x a week. She doesn't seem to miss spiriva. She has occasional coughing. She remains on zyrtec and omeprazole   ROV 03/08/15 -- follow-up visit for asthmatic COPD in the setting of prior tobacco use, cotton dust exposure and also childhood asthma. She is currently managed on Advair 500/50 twice a day, albuterol when necessary which she uses about 3-4x a weeks. She's also been seen by Dr. Johnsie Cancel for hypertension and atrial fibrillation, cardioverted in July. She has stayed in NSR on flecainide. She still has some exertional dyspnea on some days, not all. Notices with chores. She has a new cough that started  A few months ago - note avapro added at that time. She is  sneezing some, on zyrtec.   04/22/2014-65 year old female followed by Dr. Lamonte Sakai for tobacco history, asthma/COPD, complicated by GERD, atrial fibrillation/cardioverted, morbid obesity Acute Visit-RB pt. C/o increase SOB, chest tx, prod cough (green thick phlem), wheezing, blowing out clear phlem from nose. All started yesterday Acute onset 24 hours ago of shortness of breath, productive cough, blowing nose, wheeze with no fever or blood and no sore throat.  ROS-see HPI   Negative unless "+" Constitutional:    weight loss, night sweats, fevers, chills, fatigue, lassitude. HEENT:    headaches, difficulty swallowing, tooth/dental problems, sore throat,       sneezing, itching, ear ache, nasal congestion, post nasal drip, snoring CV:    chest pain, orthopnea, PND, swelling in lower extremities, anasarca,                                                        dizziness, palpitations Resp:   shortness of breath with exertion or at rest.                productive cough,   non-productive cough, coughing up of blood.              change in color of mucus.  wheezing.   Skin:  rash or lesions. GI:  No-   heartburn, indigestion, abdominal pain, nausea, vomiting,  GU:  MS:   joint pain, stiffness, . Neuro-     nothing unusual Psych:  change in mood or affect.  depression or anxiety.   memory loss.   OBJ- Physical Exam General- Alert, Oriented, Affect-appropriate, Distress- none acute Skin- rash-none, lesions- none, excoriation- none Lymphadenopathy- none Head- atraumatic            Eyes- Gross vision intact, PERRLA, conjunctivae and secretions clear            Ears- Hearing, canals-normal            Nose- Clear, no-Septal dev, mucus, polyps, erosion, perforation             Throat- Mallampati III-IV , mucosa + red , drainage- none, tonsils- atrophic Neck- flexible , trachea midline, no stridor , thyroid nl, carotid no bruit Chest - symmetrical excursion , unlabored           Heart/CV- RRR , no  murmur , no gallop  , no rub, nl s1 s2                           - JVD- none , edema- none, stasis changes- none, varices- none           Lung- clear to P&A, wheezesin unlabored expiratory, cough- none , dullness-none, rub- none           Chest wall-  Abd-  Br/ Gen/ Rectal- Not done, not indicated Extrem- cyanosis- none, clubbing, none, atrophy- none, strength- nl Neuro- grossly intact to observation

## 2015-04-29 ENCOUNTER — Ambulatory Visit: Payer: Medicare Other | Admitting: Cardiology

## 2015-04-30 ENCOUNTER — Ambulatory Visit
Admission: RE | Admit: 2015-04-30 | Discharge: 2015-04-30 | Disposition: A | Discharge status: Home / Self Care | Payer: Medicare Other | Source: Ambulatory Visit | Attending: Internal Medicine | Admitting: Internal Medicine

## 2015-04-30 ENCOUNTER — Other Ambulatory Visit: Payer: Medicare Other

## 2015-04-30 DIAGNOSIS — R109 Unspecified abdominal pain: Secondary | ICD-10-CM

## 2015-05-01 ENCOUNTER — Encounter: Payer: Self-pay | Admitting: Internal Medicine

## 2015-06-06 ENCOUNTER — Other Ambulatory Visit: Payer: Self-pay | Admitting: Internal Medicine

## 2015-06-13 ENCOUNTER — Telehealth: Payer: Self-pay

## 2015-06-13 ENCOUNTER — Other Ambulatory Visit: Payer: Self-pay | Admitting: Internal Medicine

## 2015-06-13 NOTE — Telephone Encounter (Signed)
Ativan too soon since just done feb 17

## 2015-06-13 NOTE — Telephone Encounter (Signed)
Please advise, patient is needing refill 

## 2015-06-13 NOTE — Telephone Encounter (Signed)
Please advise, patient is needing refill on ativan

## 2015-06-13 NOTE — Telephone Encounter (Signed)
Ativan too soon, since it was just done feb 16

## 2015-06-18 ENCOUNTER — Other Ambulatory Visit: Payer: Self-pay | Admitting: Internal Medicine

## 2015-06-18 NOTE — Telephone Encounter (Signed)
Please advise patient is requesting refill on ativan and Dr. Jenny Reichmann is out of the office until Monday.

## 2015-06-19 ENCOUNTER — Ambulatory Visit (INDEPENDENT_AMBULATORY_CARE_PROVIDER_SITE_OTHER): Payer: Medicare Other | Admitting: Cardiology

## 2015-06-19 ENCOUNTER — Encounter: Payer: Self-pay | Admitting: Cardiology

## 2015-06-19 VITALS — BP 140/80 | HR 57 | Ht 62.0 in | Wt 257.0 lb

## 2015-06-19 DIAGNOSIS — I480 Paroxysmal atrial fibrillation: Secondary | ICD-10-CM | POA: Diagnosis not present

## 2015-06-19 DIAGNOSIS — I272 Other secondary pulmonary hypertension: Secondary | ICD-10-CM

## 2015-06-19 DIAGNOSIS — G4719 Other hypersomnia: Secondary | ICD-10-CM | POA: Diagnosis not present

## 2015-06-19 DIAGNOSIS — G4733 Obstructive sleep apnea (adult) (pediatric): Secondary | ICD-10-CM | POA: Insufficient documentation

## 2015-06-19 DIAGNOSIS — I10 Essential (primary) hypertension: Secondary | ICD-10-CM | POA: Diagnosis not present

## 2015-06-19 DIAGNOSIS — R0683 Snoring: Secondary | ICD-10-CM

## 2015-06-19 HISTORY — DX: Snoring: R06.83

## 2015-06-19 HISTORY — DX: Other hypersomnia: G47.19

## 2015-06-19 HISTORY — DX: Pulmonary hypertension, unspecified: I27.20

## 2015-06-19 MED ORDER — ESZOPICLONE 3 MG PO TABS: 3.0000 mg | ORAL_TABLET | Freq: Every day | ORAL | Status: DC

## 2015-06-19 NOTE — Progress Notes (Signed)
Cardiology Office Note   Date:  06/19/2015   ID:  Andrea Perry, DOB [birth year], MRN [id number]  PCP:  Cathlean Cower, MD    No chief complaint on file.     History of Present Illness: Andrea Perry is a 65 y.o. female who presents for evaluation of OSA.  She has a history of atrial fibrillation and Dr. Johnsie Cancel recommended evaluation for possible sleep apnea as a contributing factor to her PAF.  She also has a history of COPD and mild pulmonary HTN by echo.  She has mobid obesity.  She says that she has severe snoring according to her brother who says she sounds like a "freightrain" when she sleeps.  She has problems with insomnia and never gets to sleep until 4am but then sleeps until 10am - noon.  When she wakes up she feels rested.  She does get sleepy during the day and falls asleep for an hour.  She occasionally may have a headache when she gets up in the am.      Past Medical History  Diagnosis Date  . Impaired glucose tolerance 09/21/2010  . ALLERGIC RHINITIS 08/16/2008  . ANXIETY 08/16/2008  . ASTHMA 08/16/2008  . COPD 08/16/2008  . DEPRESSION 08/16/2008  . GENITAL HERPES 12/03/2006  . HEMATOCHEZIA 06/20/2009  . HYPERLIPIDEMIA 08/16/2008  . HYPERTENSION 12/03/2006  . IBS 06/20/2009    hx per patient, no current problem  . Hemorrhoids   . HYPOTHYROIDISM 12/03/2006    History only - no current problem  . GERD (gastroesophageal reflux disease)   . H/O hiatal hernia   . Arthritis     hands, knees, lower back  . Chronic lower back pain     problems with disc L2-5  . Dysrhythmia     hx AF  . History of cardioversion 10/30/14  . Pulmonary HTN ([place]) 06/19/2015    Past Surgical History  Procedure Laterality Date  . Appendectomy    . Knee surgery      Right   . Tonsillectomy and adenoidectomy    . Abdominal hysterectomy    . Carpal tunnel release      Right   . Cesarean section      x 1   . Colonoscopy  2004  . Cystocele repair N/A 10/25/2012    Procedure: ANTERIOR  REPAIR (CYSTOCELE);  Surgeon: Gus Height, MD;  Location: [facility];  Service: Gynecology;  Laterality: N/A;  . Cardioversion N/A 03/20/2014    Procedure: CARDIOVERSION;  Surgeon: Josue Hector, MD;  Location: [facility];  Service: Cardiovascular;  Laterality: N/A;  . Cardioversion N/A 10/30/2014    Procedure: CARDIOVERSION;  Surgeon: Josue Hector, MD;  Location: [facility];  Service: Cardiovascular;  Laterality: N/A;  . Radiology with anesthesia Right 11/01/2014    Procedure: MRI RIGHT FOREARM;  Surgeon: Medication Radiologist, MD;  Location: [facility];  Service: Radiology;  Laterality: Right;     Current Outpatient Prescriptions  Medication Sig Dispense Refill  . acetaminophen (TYLENOL) 650 MG CR tablet Take 1,300 mg by mouth 2 (two) times daily.    Marland Kitchen ADVAIR DISKUS 500-50 MCG/DOSE AEPB INHALE ONE PUFF INTO THE LUNGS TWICE DAILY 60 each 5  . albuterol (PROVENTIL HFA;VENTOLIN HFA) 108 (90 BASE) MCG/ACT inhaler Inhale 2 puffs into the lungs every 6 (six) hours as needed for wheezing or shortness of breath.    Marland Kitchen b complex vitamins tablet  Take 1 tablet by mouth daily.    . benzonatate (TESSALON) 100 MG capsule TAKE ONE TO TWO CAPSULES BY MOUTH EVERY 6 HOURS AS NEEDED FOR COUGH. 60 capsule 0  . BLACK COHOSH EXTRACT PO Take 40 mg by mouth daily.    . cetirizine (ZYRTEC) 10 MG tablet Take 10 mg by mouth daily.     . cholecalciferol (VITAMIN D) 1000 UNITS tablet Take 1,000 Units by mouth daily.    Mariane Baumgarten Calcium (STOOL SOFTENER PO) Take 1 tablet by mouth daily as needed (constipation).     Marland Kitchen ELIQUIS 5 MG TABS tablet TAKE ONE TABLET BY MOUTH TWICE DAILY. 60 tablet 11  . escitalopram (LEXAPRO) 20 MG tablet Take 1 tablet (20 mg total) by mouth daily. 90 tablet 3  . estradiol (ESTRACE) 2 MG tablet TAKE ONE TABLET BY MOUTH ONCE DAILY 90 tablet 1  . flecainide (TAMBOCOR) 50 MG tablet TAKE ONE TABLET BY MOUTH TWICE DAILY 180 tablet 1  . furosemide (LASIX) 40 MG tablet Take 1 tablet (40 mg total) by mouth  daily. 90 tablet 3  . Garlic 123XX123 MG CAPS Take 1 capsule by mouth at bedtime.     . GuaiFENesin (MUCUS RELIEF ADULT PO) Take 1 tablet by mouth daily as needed (mucus).     . irbesartan (AVAPRO) 300 MG tablet Take 0.5 tablets (150 mg total) by mouth daily. 90 tablet 3  . Loperamide HCl (ANTI-DIARRHEAL PO) Take 1 tablet by mouth daily as needed (upset stomach).     . LORazepam (ATIVAN) 1 MG tablet TAKE ONE TABLET BY MOUTH AT BEDTIME AS NEEDED FOR ANXIETY 30 tablet 0  . metoprolol (LOPRESSOR) 50 MG tablet TAKE ONE & ONE-HALF TABLETS BY MOUTH TWICE DAILY 90 tablet 0  . Multiple Vitamin (MULTIVITAMIN) tablet Take 1 tablet by mouth daily.      . Omega-3 Fatty Acids (FISH OIL) 1200 MG CAPS Take 1 capsule by mouth daily.     . pantoprazole (PROTONIX) 40 MG tablet TAKE ONE TABLET BY MOUTH ONCE DAILY 90 tablet 3  . potassium chloride SA (K-DUR,KLOR-CON) 20 MEQ tablet Take 1 tablet (20 mEq total) by mouth daily. 90 tablet 3  . pravastatin (PRAVACHOL) 40 MG tablet TAKE ONE TABLET BY MOUTH ONCE DAILY 90 tablet 3   No current facility-administered medications for this visit.    Allergies:   Codeine; Tramadol hcl; and Vicodin    Social History:  The patient  reports that she quit smoking about 12 years ago. Her smoking use included Cigarettes. She has a 45 pack-year smoking history. She has never used smokeless tobacco. She reports that she does not drink alcohol or use illicit drugs.   Family History:  The patient's family history includes Colon polyps in her mother; Diabetes in her mother; Hyperlipidemia in her mother; Hypertension in her mother; Stroke in her father. There is no history of Colon cancer.    ROS:  Please see the history of present illness.   Otherwise, review of systems are positive for none.   All other systems are reviewed and negative.    PHYSICAL EXAM: VS:  BP 140/80 mmHg  Pulse 57  Ht 5\' 2"  (1.575 m)  Wt 257 lb (116.574 kg)  BMI 46.99 kg/m2 , BMI Body mass index is 46.99  kg/(m^2). GEN: Well nourished, well developed, in no acute distress HEENT: normal Neck: no JVD, carotid bruits, or masses Cardiac: RRR; no murmurs, rubs, or gallops,no edema  Respiratory:  clear to auscultation bilaterally, normal work  of breathing GI: soft, nontender, nondistended, + BS MS: no deformity or atrophy Skin: warm and dry, no rash Neuro:  Strength and sensation are intact Psych: euthymic mood, full affect   EKG:  EKG is not ordered today.   Recent Labs: 10/04/2014: TSH 3.02 04/18/2015: ALT 21; BUN 9; Creatinine, Ser 0.59; Hemoglobin 13.0; Platelets 249.0; Potassium 4.8; Sodium 141    Lipid Panel    Component Value Date/Time   CHOL 144 04/18/2015 1511   TRIG 250.0* 04/18/2015 1511   HDL 36.10* 04/18/2015 1511   CHOLHDL 4 04/18/2015 1511   VLDL 50.0* 04/18/2015 1511   LDLCALC 50 04/04/2014 1158   LDLDIRECT 75.0 04/18/2015 1511      Wt Readings from Last 3 Encounters:  06/19/15 257 lb (116.574 kg)  04/23/15 263 lb 6.4 oz (119.477 kg)  04/18/15 263 lb (119.296 kg)        ASSESSMENT AND PLAN:  1.  New onset atrial fibrillation - maintaining NSR after DCCV on Eliquis/Flecainide and BB. 2.  Pulmonary HTN - moderate by echo with PASP 78mmHg most likely secondary to COPD but sleep apnea could also be a contributing factor. 3.  Excessive daytime sleepiness - this is probably in part due to poor sleep hygiene but I also suspect that she has signiicant OSA.  I will get a split night PSG.  Given her tendency towards insomnia, I will give her a prescription for Lunesta 3mg  to be taken when she arrives at the sleep lab. 4.  Snoring - severe at times.   5.  Morbid obesity - I have encouraged her to try to get into a routine exercise program and join weight watchers.     Current medicines are reviewed at length with the patient today.  The patient does not have concerns regarding medicines.  The following changes have been made:  no change  Labs/ tests ordered today:  See above Assessment and Plan No orders of the defined types were placed in this encounter.     Disposition:   FU with me PRN pending results of sleep study.   Lurena Nida, MD  06/19/2015 2:15 PM    [facility] [address], [place], [place]  [address] Phone: [phone]; Fax: [phone]

## 2015-06-19 NOTE — Patient Instructions (Signed)
Medication Instructions:  Your physician has recommended you make the following change in your medication: 1) you have been prescribed Lunesta 3 mg. Please take on arrival to sleep lab for sleep study.  Labwork: None  Testing/Procedures: Your physician has recommended that you have a sleep study. This test records several body functions during sleep, including: brain activity, eye movement, oxygen and carbon dioxide blood levels, heart rate and rhythm, breathing rate and rhythm, the flow of air through your mouth and nose, snoring, body muscle movements, and chest and belly movement.  Follow-Up: Your physician recommends that you schedule a follow-up appointment AS NEEDED with Dr. Radford Pax pending study results.  Any Other Special Instructions Will Be Listed Below (If Applicable).     If you need a refill on your cardiac medications before your next appointment, please call your pharmacy.

## 2015-06-26 NOTE — Addendum Note (Signed)
Addended by: Aviva Signs M on: 06/26/2015 01:45 PM   Modules accepted: Orders

## 2015-06-26 NOTE — Telephone Encounter (Signed)
Pt called and requested rf for lorazepam. Informed that rx was sent 06/06/15. Pt stated that she is out. I requested pt to call pharmacy and find out if they filled the rx from the 16th of February.

## 2015-07-08 ENCOUNTER — Ambulatory Visit (INDEPENDENT_AMBULATORY_CARE_PROVIDER_SITE_OTHER): Payer: Medicare Other | Admitting: Sports Medicine

## 2015-07-08 ENCOUNTER — Encounter: Payer: Self-pay | Admitting: Sports Medicine

## 2015-07-08 VITALS — BP 178/86 | Ht 63.0 in | Wt 250.0 lb

## 2015-07-08 DIAGNOSIS — R2231 Localized swelling, mass and lump, right upper limb: Secondary | ICD-10-CM

## 2015-07-08 DIAGNOSIS — M25511 Pain in right shoulder: Secondary | ICD-10-CM

## 2015-07-08 MED ORDER — METHYLPREDNISOLONE ACETATE 40 MG/ML IJ SUSP
40.0000 mg | Freq: Once | INTRAMUSCULAR | Status: AC
  Administered 2015-07-08: 40 mg via INTRA_ARTICULAR

## 2015-07-08 NOTE — Progress Notes (Signed)
   Subjective:    Patient ID: Andrea Perry, female    DOB: 1950-10-13, 65 y.o.   MRN: [id number]  HPI  Patient comes in today with returning right shoulder pain. She has a history of rotator cuff tendinopathy. Pain has returned. Previous subacromial cortisone injection was beneficial for a few weeks. She would like to have a repeat injection.  She is also concerned about a growing mass in her right forearm. Recent MRI did not identify any specific mass but the radiologist commented that an occult lipoma was possible. She's beginning to experience increasing discomfort in the area.     Review of Systems     Objective:   Physical Exam Well-developed, well-nourished. No acute distress  Right shoulder: Full range of motion. Positive painful ARC. She is tender to palpation along the posterior aspect of the shoulder. Positive empty can. Rotator cuff strength is 5/5 but reproducible pain with resisted supraspinatus.  Right forearm: There is a palpable indurated mass along the dorsal aspect of the right forearm. Minimally tender to palpation. Does not appear to be freely movable. Neurovascularly intact distally.       Assessment & Plan:  Returning right shoulder pain secondary to rotator cuff tendinopathy/subacromial bursitis Right forearm mass  Patient's right subacromial space was injected with cortisone today. I will refer her to Dr. Percell Miller for the mass on her forearm. She'll follow-up with me as needed.  Consent obtained and verified. Time-out conducted. Noted no overlying erythema, induration, or other signs of local infection. Skin prepped in a sterile fashion. Topical analgesic spray: Ethyl chloride. Joint: right shoulder Needle: 25g 1.5 inch Completed without difficulty. Meds: 3cc 1% xylocaine, 1cc (40mg ) depomedrol  Advised to call if fevers/chills, erythema, induration, drainage, or persistent bleeding.

## 2015-07-12 ENCOUNTER — Other Ambulatory Visit: Payer: Self-pay | Admitting: Internal Medicine

## 2015-07-15 ENCOUNTER — Other Ambulatory Visit: Payer: Self-pay | Admitting: Internal Medicine

## 2015-07-16 ENCOUNTER — Other Ambulatory Visit: Payer: Self-pay | Admitting: Internal Medicine

## 2015-07-16 NOTE — Telephone Encounter (Signed)
Done hardcopy to Corinne  

## 2015-07-16 NOTE — Telephone Encounter (Signed)
Please advise patient requesting refill on medication

## 2015-07-17 NOTE — Telephone Encounter (Signed)
Medication sent to pharmacy  

## 2015-07-22 DIAGNOSIS — M25511 Pain in right shoulder: Secondary | ICD-10-CM | POA: Diagnosis not present

## 2015-07-22 DIAGNOSIS — M25562 Pain in left knee: Secondary | ICD-10-CM | POA: Diagnosis not present

## 2015-07-23 ENCOUNTER — Encounter: Payer: Self-pay | Admitting: Internal Medicine

## 2015-07-23 ENCOUNTER — Ambulatory Visit (INDEPENDENT_AMBULATORY_CARE_PROVIDER_SITE_OTHER): Payer: Medicare Other | Admitting: Internal Medicine

## 2015-07-23 VITALS — BP 120/78 | HR 67 | Temp 98.3°F | Resp 20 | Wt 258.0 lb

## 2015-07-23 DIAGNOSIS — I10 Essential (primary) hypertension: Secondary | ICD-10-CM

## 2015-07-23 DIAGNOSIS — J441 Chronic obstructive pulmonary disease with (acute) exacerbation: Secondary | ICD-10-CM

## 2015-07-23 DIAGNOSIS — E119 Type 2 diabetes mellitus without complications: Secondary | ICD-10-CM | POA: Diagnosis not present

## 2015-07-23 DIAGNOSIS — J019 Acute sinusitis, unspecified: Secondary | ICD-10-CM

## 2015-07-23 MED ORDER — PREDNISONE 10 MG PO TABS: ORAL_TABLET | ORAL | Status: DC

## 2015-07-23 MED ORDER — METOPROLOL TARTRATE 50 MG PO TABS: ORAL_TABLET | ORAL | Status: DC

## 2015-07-23 MED ORDER — PROMETHAZINE-CODEINE 6.25-10 MG/5ML PO SYRP: 5.0000 mL | ORAL_SOLUTION | ORAL | Status: DC | PRN

## 2015-07-23 MED ORDER — LEVOFLOXACIN 500 MG PO TABS: 500.0000 mg | ORAL_TABLET | Freq: Every day | ORAL | Status: DC

## 2015-07-23 MED ORDER — METHYLPREDNISOLONE ACETATE 80 MG/ML IJ SUSP
80.0000 mg | Freq: Once | INTRAMUSCULAR | Status: AC
  Administered 2015-07-23: 80 mg via INTRAMUSCULAR

## 2015-07-23 NOTE — Progress Notes (Signed)
Subjective:    Patient ID: Andrea Perry, female    DOB: [birth year], 65 y.o.   MRN: [id number]  HPI   Here with 2-3 days acute onset fever, facial pain, pressure, headache, general weakness and malaise, and greenish d/c, with mild ST, and also Here with acute onset mild to mod 1-2 days ST, HA, general weakness and malaise, with prod cough greenish sputum, but Pt denies chest pain, increased sob or doe, wheezing, orthopnea, PND, increased LE swelling, palpitations, dizziness or syncope, except for onset mild wheezing/sob since last night.  Using inhaler more often over 4 times per day.  Also, Dr Illene Silver has incrased her metoproll to 1.5 bid, but needs new rx as she will run out Past Medical History  Diagnosis Date  . Impaired glucose tolerance 09/21/2010  . ALLERGIC RHINITIS 08/16/2008  . ANXIETY 08/16/2008  . ASTHMA 08/16/2008  . COPD 08/16/2008  . DEPRESSION 08/16/2008  . GENITAL HERPES 12/03/2006  . HEMATOCHEZIA 06/20/2009  . HYPERLIPIDEMIA 08/16/2008  . HYPERTENSION 12/03/2006  . IBS 06/20/2009    hx per patient, no current problem  . Hemorrhoids   . HYPOTHYROIDISM 12/03/2006    History only - no current problem  . GERD (gastroesophageal reflux disease)   . H/O hiatal hernia   . Arthritis     hands, knees, lower back  . Chronic lower back pain     problems with disc L2-5  . Dysrhythmia     hx AF  . History of cardioversion 10/30/14  . Pulmonary HTN ([place]) 06/19/2015  . Excessive daytime sleepiness 06/19/2015  . Snoring 06/19/2015   Past Surgical History  Procedure Laterality Date  . Appendectomy    . Knee surgery      Right   . Tonsillectomy and adenoidectomy    . Abdominal hysterectomy    . Carpal tunnel release      Right   . Cesarean section      x 1   . Colonoscopy  2004  . Cystocele repair N/A 10/25/2012    Procedure: ANTERIOR REPAIR (CYSTOCELE);  Surgeon: Gus Height, MD;  Location: [facility];  Service: Gynecology;  Laterality: N/A;  . Cardioversion N/A 03/20/2014    Procedure:  CARDIOVERSION;  Surgeon: Josue Hector, MD;  Location: [place];  Service: Cardiovascular;  Laterality: N/A;  . Cardioversion N/A 10/30/2014    Procedure: CARDIOVERSION;  Surgeon: Josue Hector, MD;  Location: [facility];  Service: Cardiovascular;  Laterality: N/A;  . Radiology with anesthesia Right 11/01/2014    Procedure: MRI RIGHT FOREARM;  Surgeon: Medication Radiologist, MD;  Location: [facility];  Service: Radiology;  Laterality: Right;    reports that she quit smoking about 12 years ago. Her smoking use included Cigarettes. She has a 45 pack-year smoking history. She has never used smokeless tobacco. She reports that she does not drink alcohol or use illicit drugs. family history includes Colon polyps in her mother; Diabetes in her mother; Hyperlipidemia in her mother; Hypertension in her mother; Stroke in her father. There is no history of Colon cancer. Allergies  Allergen Reactions  . Codeine Nausea And Vomiting  . Tramadol Hcl Nausea And Vomiting    REACTION: mouth dryness, headache  . Vicodin [Hydrocodone-Acetaminophen] Nausea And Vomiting   Current Outpatient Prescriptions on File Prior to Visit  Medication Sig Dispense Refill  . acetaminophen (TYLENOL) 650 MG CR tablet Take 1,300 mg by mouth 2 (two) times daily.    Marland Kitchen ADVAIR DISKUS 500-50 MCG/DOSE AEPB INHALE ONE PUFF  INTO THE LUNGS TWICE DAILY 60 each 5  . albuterol (PROVENTIL HFA;VENTOLIN HFA) 108 (90 BASE) MCG/ACT inhaler Inhale 2 puffs into the lungs every 6 (six) hours as needed for wheezing or shortness of breath.    Marland Kitchen b complex vitamins tablet Take 1 tablet by mouth daily.    . benzonatate (TESSALON) 100 MG capsule TAKE ONE TO TWO CAPSULES BY MOUTH EVERY 6 HOURS AS NEEDED FOR COUGH. 60 capsule 0  . BLACK COHOSH EXTRACT PO Take 40 mg by mouth daily.    . cetirizine (ZYRTEC) 10 MG tablet Take 10 mg by mouth daily.     . cholecalciferol (VITAMIN D) 1000 UNITS tablet Take 1,000 Units by mouth daily.    Mariane Baumgarten Calcium  (STOOL SOFTENER PO) Take 1 tablet by mouth daily as needed (constipation).     Marland Kitchen ELIQUIS 5 MG TABS tablet TAKE ONE TABLET BY MOUTH TWICE DAILY. 60 tablet 11  . escitalopram (LEXAPRO) 20 MG tablet Take 1 tablet (20 mg total) by mouth daily. 90 tablet 3  . estradiol (ESTRACE) 2 MG tablet TAKE ONE TABLET BY MOUTH ONCE DAILY 90 tablet 1  . Eszopiclone 3 MG TABS Take 1 tablet (3 mg total) by mouth at bedtime. Take immediately before bedtime 1 tablet 0  . flecainide (TAMBOCOR) 50 MG tablet TAKE ONE TABLET BY MOUTH TWICE DAILY 180 tablet 1  . furosemide (LASIX) 40 MG tablet Take 1 tablet (40 mg total) by mouth daily. 90 tablet 3  . Garlic 123XX123 MG CAPS Take 1 capsule by mouth at bedtime.     . GuaiFENesin (MUCUS RELIEF ADULT PO) Take 1 tablet by mouth daily as needed (mucus).     . irbesartan (AVAPRO) 150 MG tablet TAKE ONE TABLET BY MOUTH ONCE DAILY 90 tablet 0  . irbesartan (AVAPRO) 300 MG tablet Take 0.5 tablets (150 mg total) by mouth daily. 90 tablet 3  . Loperamide HCl (ANTI-DIARRHEAL PO) Take 1 tablet by mouth daily as needed (upset stomach).     . LORazepam (ATIVAN) 1 MG tablet TAKE ONE TABLET BY MOUTH AT BEDTIME AS NEEDED FOR ANXIETY 30 tablet 3  . Multiple Vitamin (MULTIVITAMIN) tablet Take 1 tablet by mouth daily.      . Omega-3 Fatty Acids (FISH OIL) 1200 MG CAPS Take 1 capsule by mouth daily.     . pantoprazole (PROTONIX) 40 MG tablet TAKE ONE TABLET BY MOUTH ONCE DAILY 90 tablet 3  . potassium chloride SA (K-DUR,KLOR-CON) 20 MEQ tablet Take 1 tablet (20 mEq total) by mouth daily. 90 tablet 3  . pravastatin (PRAVACHOL) 40 MG tablet TAKE ONE TABLET BY MOUTH ONCE DAILY 90 tablet 3   No current facility-administered medications on file prior to visit.   Review of Systems  Constitutional: Negative for unusual diaphoresis or night sweats HENT: Negative for ear swelling or discharge Eyes: Negative for worsening visual haziness  Respiratory: Negative for choking and stridor.     Gastrointestinal: Negative for distension or worsening eructation Genitourinary: Negative for retention or change in urine volume.  Musculoskeletal: Negative for other MSK pain or swelling Skin: Negative for color change and worsening wound Neurological: Negative for tremors and numbness other than noted  Psychiatric/Behavioral: Negative for decreased concentration or agitation other than above       Objective:   Physical Exam BP 120/78 mmHg  Pulse 67  Temp(Src) 98.3 F (36.8 C) (Oral)  Resp 20  Wt 258 lb (117.028 kg)  SpO2 94% VS noted, mild ill Constitutional: Pt  appears in no apparent distress HENT: Head: NCAT.  Right Ear: External ear normal.  Left Ear: External ear normal.  Bilat tm's with mild erythema.  Max sinus areas mild tender.  Pharynx with mild erythema, no exudate Eyes: . Pupils are equal, round, and reactive to light. Conjunctivae and EOM are normal Neck: Normal range of motion. Neck supple.  Cardiovascular: Normal rate and regular rhythm.   Pulmonary/Chest: Effort normal and breath sounds decresaed bilat without rales but with few scattered wheezing.  Neurological: Pt is alert. Not confused , motor grossly intact Skin: Skin is warm. No rash, no LE edema Psychiatric: Pt behavior is normal. No agitation.     Assessment & Plan:

## 2015-07-23 NOTE — Progress Notes (Signed)
Pre visit review using our clinic review tool, if applicable. No additional management support is needed unless otherwise documented below in the visit note. 

## 2015-07-23 NOTE — Patient Instructions (Addendum)
You had the steroid shot today  Please take all new medication as prescribed - the antibiotic, cough medicine, and prednisone  Please continue all other medications as before, including the inhaler, and the metoprolol prescription was adjusted  Please have the pharmacy call with any other refills you may need.  Please continue your efforts at being more active, low cholesterol diet, and weight control.  Please keep your appointments with your specialists as you may have planned

## 2015-07-24 ENCOUNTER — Telehealth: Payer: Self-pay | Admitting: Internal Medicine

## 2015-07-24 NOTE — Telephone Encounter (Signed)
States Dr. Jenny Reichmann prescribed her an antibiotic but her pharmacist states this will interact with her heart medication.  Patient is requesting a different script to be sent to [facility] at [facility].

## 2015-07-24 NOTE — Telephone Encounter (Signed)
This is commonly mentioned by pharmacists  OK to take in this case as the risk is extremely low, and I would only change if the patient just will not take, ok to let pharmacy and pt know

## 2015-07-24 NOTE — Telephone Encounter (Signed)
Please advise 

## 2015-07-26 NOTE — Assessment & Plan Note (Signed)
stable overall by history and exam, recent data reviewed with pt, and pt to continue medical treatment as before,  to f/u any worsening symptoms or concerns, ok for updated rx for BB - done erx BP Readings from Last 3 Encounters:  07/23/15 120/78  07/08/15 178/86  06/19/15 140/80

## 2015-07-26 NOTE — Assessment & Plan Note (Signed)
stable overall by history and exam, recent data reviewed with pt, and pt to continue medical treatment as before,  to f/u any worsening symptoms or concerns Lab Results  Component Value Date   HGBA1C 6.3 04/18/2015

## 2015-07-26 NOTE — Assessment & Plan Note (Signed)
Mild to mod, for depomedrol IM, and predpac asd,  to f/u any worsening symptoms or concerns

## 2015-07-26 NOTE — Assessment & Plan Note (Signed)
Mild to mod, for antibx course,  to f/u any worsening symptoms or concerns 

## 2015-08-13 ENCOUNTER — Other Ambulatory Visit: Payer: Self-pay | Admitting: Internal Medicine

## 2015-08-15 ENCOUNTER — Encounter: Payer: Self-pay | Admitting: Cardiovascular Disease

## 2015-08-16 ENCOUNTER — Emergency Department (HOSPITAL_COMMUNITY): Payer: Medicare Other

## 2015-08-16 ENCOUNTER — Encounter (HOSPITAL_COMMUNITY): Payer: Self-pay

## 2015-08-16 ENCOUNTER — Emergency Department (HOSPITAL_COMMUNITY)
Admission: EM | Admit: 2015-08-16 | Discharge: 2015-08-17 | Disposition: A | Discharge status: Home / Self Care | Payer: Medicare Other | Attending: Emergency Medicine | Admitting: Emergency Medicine

## 2015-08-16 DIAGNOSIS — Z8619 Personal history of other infectious and parasitic diseases: Secondary | ICD-10-CM | POA: Insufficient documentation

## 2015-08-16 DIAGNOSIS — F439 Reaction to severe stress, unspecified: Secondary | ICD-10-CM | POA: Diagnosis not present

## 2015-08-16 DIAGNOSIS — Z79899 Other long term (current) drug therapy: Secondary | ICD-10-CM | POA: Diagnosis not present

## 2015-08-16 DIAGNOSIS — F430 Acute stress reaction: Secondary | ICD-10-CM

## 2015-08-16 DIAGNOSIS — M19042 Primary osteoarthritis, left hand: Secondary | ICD-10-CM | POA: Diagnosis not present

## 2015-08-16 DIAGNOSIS — F410 Panic disorder [episodic paroxysmal anxiety] without agoraphobia: Secondary | ICD-10-CM | POA: Diagnosis not present

## 2015-08-16 DIAGNOSIS — R61 Generalized hyperhidrosis: Secondary | ICD-10-CM | POA: Insufficient documentation

## 2015-08-16 DIAGNOSIS — J441 Chronic obstructive pulmonary disease with (acute) exacerbation: Secondary | ICD-10-CM | POA: Insufficient documentation

## 2015-08-16 DIAGNOSIS — I10 Essential (primary) hypertension: Secondary | ICD-10-CM | POA: Insufficient documentation

## 2015-08-16 DIAGNOSIS — F329 Major depressive disorder, single episode, unspecified: Secondary | ICD-10-CM | POA: Diagnosis not present

## 2015-08-16 DIAGNOSIS — E039 Hypothyroidism, unspecified: Secondary | ICD-10-CM | POA: Diagnosis not present

## 2015-08-16 DIAGNOSIS — M170 Bilateral primary osteoarthritis of knee: Secondary | ICD-10-CM | POA: Diagnosis not present

## 2015-08-16 DIAGNOSIS — K219 Gastro-esophageal reflux disease without esophagitis: Secondary | ICD-10-CM | POA: Diagnosis not present

## 2015-08-16 DIAGNOSIS — M19041 Primary osteoarthritis, right hand: Secondary | ICD-10-CM | POA: Insufficient documentation

## 2015-08-16 DIAGNOSIS — R110 Nausea: Secondary | ICD-10-CM | POA: Insufficient documentation

## 2015-08-16 DIAGNOSIS — R0789 Other chest pain: Secondary | ICD-10-CM | POA: Diagnosis not present

## 2015-08-16 DIAGNOSIS — E785 Hyperlipidemia, unspecified: Secondary | ICD-10-CM | POA: Diagnosis not present

## 2015-08-16 DIAGNOSIS — M47896 Other spondylosis, lumbar region: Secondary | ICD-10-CM | POA: Insufficient documentation

## 2015-08-16 DIAGNOSIS — Z87891 Personal history of nicotine dependence: Secondary | ICD-10-CM | POA: Diagnosis not present

## 2015-08-16 DIAGNOSIS — G8929 Other chronic pain: Secondary | ICD-10-CM | POA: Insufficient documentation

## 2015-08-16 DIAGNOSIS — R079 Chest pain, unspecified: Secondary | ICD-10-CM | POA: Diagnosis present

## 2015-08-16 DIAGNOSIS — R0602 Shortness of breath: Secondary | ICD-10-CM | POA: Diagnosis not present

## 2015-08-16 LAB — I-STAT TROPONIN, ED
Troponin i, poc: 0 ng/mL (ref 0.00–0.08)
Troponin i, poc: 0.01 ng/mL (ref 0.00–0.08)

## 2015-08-16 LAB — CBC
HCT: 43.6 % (ref 36.0–46.0)
Hemoglobin: 14.1 g/dL (ref 12.0–15.0)
MCH: 30.3 pg (ref 26.0–34.0)
MCHC: 32.3 g/dL (ref 30.0–36.0)
MCV: 93.6 fL (ref 78.0–100.0)
PLATELETS: 221 10*3/uL (ref 150–400)
RBC: 4.66 MIL/uL (ref 3.87–5.11)
RDW: 13.3 % (ref 11.5–15.5)
WBC: 8.3 10*3/uL (ref 4.0–10.5)

## 2015-08-16 LAB — BASIC METABOLIC PANEL
Anion gap: 14 (ref 5–15)
BUN: 7 mg/dL (ref 6–20)
CO2: 24 mmol/L (ref 22–32)
CREATININE: 0.62 mg/dL (ref 0.44–1.00)
Calcium: 9.1 mg/dL (ref 8.9–10.3)
Chloride: 99 mmol/L — ABNORMAL LOW (ref 101–111)
Glucose, Bld: 122 mg/dL — ABNORMAL HIGH (ref 65–99)
POTASSIUM: 4.5 mmol/L (ref 3.5–5.1)
SODIUM: 137 mmol/L (ref 135–145)

## 2015-08-16 LAB — PROTIME-INR
INR: 1.12 (ref 0.00–1.49)
PROTHROMBIN TIME: 14.6 s (ref 11.6–15.2)

## 2015-08-16 MED ORDER — METOPROLOL TARTRATE 25 MG PO TABS
75.0000 mg | ORAL_TABLET | Freq: Once | ORAL | Status: AC
  Administered 2015-08-16: 75 mg via ORAL
  Filled 2015-08-16: qty 3

## 2015-08-16 NOTE — ED Notes (Signed)
MD gave verbal for Troponin states draw at 1100.

## 2015-08-16 NOTE — ED Notes (Signed)
MD at bedside. 

## 2015-08-16 NOTE — ED Notes (Signed)
Pt here with c/o left sided chest tightness and SOB associated with nausea that started this evening before dinner. Hx of afib and cardioversion.

## 2015-08-16 NOTE — ED Provider Notes (Signed)
CSN: [id number]     Arrival date & time 08/16/15  1954 History   First MD Initiated Contact with Patient 08/16/15 2055     Chief Complaint  Patient presents with  . Chest Pain  . Shortness of Breath     (Consider location/radiation/quality/duration/timing/severity/associated sxs/prior Treatment) HPI 65 y.o. female with a hx of anxiety and panic attacks and paroxysmal a. Fib, s/p cardioversion in 2016 presents to the ED after she had an episode of warmth across her chest radiating up to her face that lasted for about 45 minutes continuously. The patient states that she has been under a lot of stress today due to car trouble. She states that her sx came on while at rest driving to eat dinner at a restaurant. She had mild associated clamminess in her face and her b/l hands, nausea and shortness of breath. She denies any associated chest pain, chest pressure/tightness or palpitations similar to her previous a. Fib. Her sx spontaneously resolved while en route to the hospital. She took no medications for her sx. She denies any significant exacerbating factors and states that her sx seemed to gradually improve with deep breathing and relaxation. She denies any hx of CAD. She states that she had a cardiac stress test about 1 year prior which was negative.    Past Medical History  Diagnosis Date  . Impaired glucose tolerance 09/21/2010  . ALLERGIC RHINITIS 08/16/2008  . ANXIETY 08/16/2008  . ASTHMA 08/16/2008  . COPD 08/16/2008  . DEPRESSION 08/16/2008  . GENITAL HERPES 12/03/2006  . HEMATOCHEZIA 06/20/2009  . HYPERLIPIDEMIA 08/16/2008  . HYPERTENSION 12/03/2006  . IBS 06/20/2009    hx per patient, no current problem  . Hemorrhoids   . HYPOTHYROIDISM 12/03/2006    History only - no current problem  . GERD (gastroesophageal reflux disease)   . H/O hiatal hernia   . Arthritis     hands, knees, lower back  . Chronic lower back pain     problems with disc L2-5  . Dysrhythmia     hx AF  . History of  cardioversion 10/30/14  . Pulmonary HTN ([place]) 06/19/2015  . Excessive daytime sleepiness 06/19/2015  . Snoring 06/19/2015   Past Surgical History  Procedure Laterality Date  . Appendectomy    . Knee surgery      Right   . Tonsillectomy and adenoidectomy    . Abdominal hysterectomy    . Carpal tunnel release      Right   . Cesarean section      x 1   . Colonoscopy  2004  . Cystocele repair N/A 10/25/2012    Procedure: ANTERIOR REPAIR (CYSTOCELE);  Surgeon: Gus Height, MD;  Location: [facility];  Service: Gynecology;  Laterality: N/A;  . Cardioversion N/A 03/20/2014    Procedure: CARDIOVERSION;  Surgeon: Josue Hector, MD;  Location: [place];  Service: Cardiovascular;  Laterality: N/A;  . Cardioversion N/A 10/30/2014    Procedure: CARDIOVERSION;  Surgeon: Josue Hector, MD;  Location: [place];  Service: Cardiovascular;  Laterality: N/A;  . Radiology with anesthesia Right 11/01/2014    Procedure: MRI RIGHT FOREARM;  Surgeon: Medication Radiologist, MD;  Location: [place] [facility];  Service: Radiology;  Laterality: Right;   Family History  Problem Relation Age of Onset  . Diabetes Mother   . Hyperlipidemia Mother   . Hypertension Mother   . Colon polyps Mother   . Stroke Father   . Colon cancer Neg Hx    Social History  Substance Use Topics  . Smoking status: Former Smoker -- 1.50 packs/day for 30 years    Types: Cigarettes    Quit date: 04/21/2003  . Smokeless tobacco: Never Used  . Alcohol Use: No   OB History    No data available     Review of Systems  Constitutional: Positive for diaphoresis. Negative for fever, chills, activity change and appetite change.  HENT: Negative for congestion, sinus pressure and sneezing.   Respiratory: Positive for shortness of breath. Negative for cough, chest tightness and wheezing.   Cardiovascular: Negative for chest pain, palpitations and leg swelling.       Warmth across chest and into face and hands  Gastrointestinal: Positive for nausea.  Negative for vomiting, abdominal pain, diarrhea and constipation.  Genitourinary: Negative for dysuria, frequency and difficulty urinating.  Musculoskeletal: Negative for back pain and neck pain.  Skin: Negative for rash and wound.  Neurological: Negative for dizziness, syncope, weakness, light-headedness, numbness and headaches.  Psychiatric/Behavioral: The patient is nervous/anxious.   All other systems reviewed and are negative.     Allergies  Codeine; Tramadol hcl; and Vicodin  Home Medications   Prior to Admission medications   Medication Sig Start Date End Date Taking? Authorizing Provider  acetaminophen (TYLENOL) 650 MG CR tablet Take 1,300 mg by mouth 2 (two) times daily.   Yes Historical Provider, MD  ADVAIR DISKUS 500-50 MCG/DOSE AEPB INHALE ONE PUFF INTO THE LUNGS TWICE DAILY 01/25/15  Yes Biagio Borg, MD  albuterol (PROVENTIL HFA;VENTOLIN HFA) 108 (90 BASE) MCG/ACT inhaler Inhale 2 puffs into the lungs every 6 (six) hours as needed for wheezing or shortness of breath.   Yes Historical Provider, MD  b complex vitamins tablet Take 1 tablet by mouth daily.   Yes Historical Provider, MD  BLACK COHOSH EXTRACT PO Take 40 mg by mouth daily.   Yes Historical Provider, MD  cetirizine (ZYRTEC) 10 MG tablet Take 10 mg by mouth daily.    Yes Historical Provider, MD  cholecalciferol (VITAMIN D) 1000 UNITS tablet Take 1,000 Units by mouth daily.   Yes Historical Provider, MD  ELIQUIS 5 MG TABS tablet TAKE ONE TABLET BY MOUTH TWICE DAILY. 11/08/14  Yes Josue Hector, MD  escitalopram (LEXAPRO) 20 MG tablet Take 1 tablet (20 mg total) by mouth daily. Patient taking differently: Take 20 mg by mouth at bedtime.  10/04/14  Yes Biagio Borg, MD  estradiol (ESTRACE) 2 MG tablet TAKE ONE TABLET BY MOUTH ONCE DAILY Patient taking differently: TAKE ONE TABLET BY MOUTH ONCE every evening 02/22/15  Yes Biagio Borg, MD  flecainide (TAMBOCOR) 50 MG tablet TAKE ONE TABLET BY MOUTH TWICE DAILY 03/13/15   Yes Josue Hector, MD  furosemide (LASIX) 40 MG tablet TAKE ONE TABLET BY MOUTH DAILY 08/13/15  Yes Biagio Borg, MD  Garlic 123XX123 MG CAPS Take 1 capsule by mouth at bedtime.    Yes Historical Provider, MD  GuaiFENesin (MUCUS RELIEF ADULT PO) Take 1 tablet by mouth 2 (two) times daily.    Yes Historical Provider, MD  LORazepam (ATIVAN) 1 MG tablet TAKE ONE TABLET BY MOUTH AT BEDTIME AS NEEDED FOR ANXIETY 07/16/15  Yes Biagio Borg, MD  metoprolol (LOPRESSOR) 50 MG tablet Take 1 and 1/2 tabs by mouth twice per day 07/23/15  Yes Biagio Borg, MD  Omega-3 Fatty Acids (FISH OIL) 1200 MG CAPS Take 1 capsule by mouth daily.    Yes Historical Provider, MD  pantoprazole (PROTONIX) 40  MG tablet TAKE ONE TABLET BY MOUTH ONCE DAILY 07/25/14  Yes Biagio Borg, MD  potassium chloride SA (K-DUR,KLOR-CON) 20 MEQ tablet Take 1 tablet (20 mEq total) by mouth daily. 10/29/14  Yes Josue Hector, MD  pravastatin (PRAVACHOL) 40 MG tablet TAKE ONE TABLET BY MOUTH ONCE DAILY Patient taking differently: TAKE ONE TABLET BY MOUTH ONCE every evening 09/06/14  Yes Biagio Borg, MD  promethazine-codeine [facility] WITH CODEINE) 6.25-10 MG/5ML syrup Take 5 mLs by mouth every 4 (four) hours as needed for cough. 07/23/15  Yes Biagio Borg, MD  irbesartan (AVAPRO) 150 MG tablet TAKE ONE TABLET BY MOUTH ONCE DAILY Patient taking differently: TAKE two TABLETs (300 mg) BY MOUTH ONCE DAILY 07/16/15   Biagio Borg, MD   BP 165/70 mmHg  Pulse 57  Temp(Src) 97.5 F (36.4 C) (Oral)  Resp 13  SpO2 98% Physical Exam  Constitutional: She is oriented to person, place, and time. She appears well-developed and well-nourished. No distress.  HENT:  Head: Normocephalic and atraumatic.  Nose: Nose normal.  Mouth/Throat: Oropharynx is clear and moist.  Eyes: Conjunctivae and EOM are normal. Pupils are equal, round, and reactive to light.  Neck: Neck supple.  Cardiovascular: Normal rate, regular rhythm, normal heart sounds and intact distal pulses.    Pulmonary/Chest: Effort normal and breath sounds normal. She exhibits no tenderness.  Abdominal: Soft. There is no tenderness.  Musculoskeletal: She exhibits no edema or tenderness.  Neurological: She is alert and oriented to person, place, and time. No cranial nerve deficit. Coordination normal.  Skin: Skin is warm and dry. No rash noted. She is not diaphoretic.  Nursing note and vitals reviewed.   ED Course  Procedures (including critical care time) Labs Review Labs Reviewed  BASIC METABOLIC PANEL - Abnormal; Notable for the following:    Chloride 99 (*)    Glucose, Bld 122 (*)    All other components within normal limits  CBC  PROTIME-INR  I-STAT TROPOININ, ED  Randolm Idol, ED    Imaging Review Dg Chest 2 View  08/16/2015  CLINICAL DATA:  Shortness of breath, chest pain. EXAM: CHEST  2 VIEW COMPARISON:  June 01, 2014. FINDINGS: The heart size and mediastinal contours are within normal limits. Both lungs are clear. No pneumothorax or pleural effusion is noted. The visualized skeletal structures are unremarkable. IMPRESSION: No active cardiopulmonary disease. Electronically Signed   By: Marijo Conception, M.D.   On: 08/16/2015 21:00   I have personally reviewed and evaluated these images and lab results as part of my medical decision-making.   EKG Interpretation   Date/Time:  Friday August 16 2015 20:05:01 EDT Ventricular Rate:  65 PR Interval:  112 QRS Duration: 84 QT Interval:  428 QTC Calculation: 445 R Axis:   46 Text Interpretation:  Normal sinus rhythm Normal ECG No significant change  was found Confirmed by CAMPOS  MD, Lennette Bihari ([id number]) on 08/16/2015 9:57:32 PM      MDM   65 y.o. female with a hx of anxiety, parox. A. Fib but no CAD presents to the ED after she had an episode of warm sensation across her chest in the setting of increased emotional stress.  Sx now resolved. On exam she is AF with hypertension as the patient has not taken her rx'd evening BP meds  yet. EKG shows NSR with normal intervals and no evidence of ischemia. Labs were drawn and showed negative troponin with normal electrolytes, no leukocytosis. CXR shows no acute  abnormality. She remained well appearing and symptom free while in the ED. Delta troponin was negative. She was given her home dose of metoprolol and had significant improvement in her BP. Given 2 negative troponins, feel that her sx were likely due to anxiety/panic attack given increased emotional stress today. HEART score 2. Low suspicion for acute cardiac ischemia as the etiology of her sx. She was recommended to follow up with her PCP in a few days to further discuss her sx and to discuss further outpatient stress testing. Return precautions were given. This plan was discussed with the patient at the bedside and she stated both understanding and agreement.   Final diagnoses:  Chest discomfort  Panic attack as reaction to stress      Zenovia Jarred, DO 08/17/15 1008  Jola Schmidt, MD 08/18/15 0021

## 2015-08-16 NOTE — ED Notes (Signed)
MD aware of patient BP.  

## 2015-08-19 DIAGNOSIS — R2231 Localized swelling, mass and lump, right upper limb: Secondary | ICD-10-CM | POA: Diagnosis not present

## 2015-08-20 ENCOUNTER — Other Ambulatory Visit (HOSPITAL_COMMUNITY): Payer: Self-pay | Admitting: Orthopedic Surgery

## 2015-08-20 DIAGNOSIS — R2231 Localized swelling, mass and lump, right upper limb: Secondary | ICD-10-CM

## 2015-08-21 NOTE — Progress Notes (Signed)
Cardiology Office Note    Date:  08/22/2015   ID:  [name], DOB June 28, 1950, MRN [id number]  PCP:  Cathlean Cower, MD   Cardiologist: Dr. Johnsie Cancel Dr Radford Pax (OSA)   Post ER follow up.   History of Present Illness:  Andrea Perry is a 65 y.o. female with a history of severe COPD, HTN, HLD, PAF on Eliquis, pulmonary HTN and morbid obesity who presents to clinic for follow up.   Failed DCCV on 03/20/14 started on flecainide. She was unable to do ETT due to arthritis. She did do a Lexiscan1/2016 which showed no ischemia or infarction. EF was 40% but follow up MUGA test showed normal EF of 52%. She continues on Flecaide 50mg  BID. She underwent successful DCCV on 10/30/14 and done well since that time.   She was seen by Dr. Heron Nay for evaluation of possible OSA in 06/2014. A sleep study was ordered.   She was seen in the ER on 08/16/15 for burning chest pain. She ruled out for MI and ECG without afib. She described an episode of warm sensation across her chest in the setting of increased emotional stress felt c/w a panic attack. She was sent home with close cardiology follow up.   Today she presents to clinic for follow up.  At home she checks her BP and usually SBP 130-150, DBP 80-90. She has not had anymore chest pain since ER visit. She does not formally exercise. She does do a lot of house work and denies chest pain but does get DOE, which is chronic for her due to her COPD. No LE edema, orthopnea or PND. No dizziness or syncope. No blood in stool or urine. No palpitations.     Past Medical History  Diagnosis Date  . Impaired glucose tolerance 09/21/2010  . ALLERGIC RHINITIS 08/16/2008  . ANXIETY 08/16/2008  . ASTHMA 08/16/2008  . COPD 08/16/2008  . DEPRESSION 08/16/2008  . GENITAL HERPES 12/03/2006  . HEMATOCHEZIA 06/20/2009  . HYPERLIPIDEMIA 08/16/2008  . HYPERTENSION 12/03/2006  . IBS 06/20/2009    hx per patient, no current problem  . Hemorrhoids   . HYPOTHYROIDISM 12/03/2006    History only  - no current problem  . GERD (gastroesophageal reflux disease)   . H/O hiatal hernia   . Arthritis     hands, knees, lower back  . Chronic lower back pain     problems with disc L2-5  . Dysrhythmia     hx AF  . History of cardioversion 10/30/14  . Pulmonary HTN ([place]) 06/19/2015  . Excessive daytime sleepiness 06/19/2015  . Snoring 06/19/2015    Past Surgical History  Procedure Laterality Date  . Appendectomy    . Knee surgery      Right   . Tonsillectomy and adenoidectomy    . Abdominal hysterectomy    . Carpal tunnel release      Right   . Cesarean section      x 1   . Colonoscopy  2004  . Cystocele repair N/A 10/25/2012    Procedure: ANTERIOR REPAIR (CYSTOCELE);  Surgeon: Gus Height, MD;  Location: [facility];  Service: Gynecology;  Laterality: N/A;  . Cardioversion N/A 03/20/2014    Procedure: CARDIOVERSION;  Surgeon: Josue Hector, MD;  Location: [facility];  Service: Cardiovascular;  Laterality: N/A;  . Cardioversion N/A 10/30/2014    Procedure: CARDIOVERSION;  Surgeon: Josue Hector, MD;  Location: [facility];  Service: Cardiovascular;  Laterality: N/A;  . Radiology with  anesthesia Right 11/01/2014    Procedure: MRI RIGHT FOREARM;  Surgeon: Medication Radiologist, MD;  Location: [facility];  Service: Radiology;  Laterality: Right;    Current Medications: Outpatient Prescriptions Prior to Visit  Medication Sig Dispense Refill  . acetaminophen (TYLENOL) 650 MG CR tablet Take 1,300 mg by mouth 2 (two) times daily.    Marland Kitchen ADVAIR DISKUS 500-50 MCG/DOSE AEPB INHALE ONE PUFF INTO THE LUNGS TWICE DAILY 60 each 5  . albuterol (PROVENTIL HFA;VENTOLIN HFA) 108 (90 BASE) MCG/ACT inhaler Inhale 2 puffs into the lungs every 6 (six) hours as needed for wheezing or shortness of breath.    Marland Kitchen b complex vitamins tablet Take 1 tablet by mouth daily.    Marland Kitchen BLACK COHOSH EXTRACT PO Take 40 mg by mouth daily.    . cetirizine (ZYRTEC) 10 MG tablet Take 10 mg by mouth daily.     . cholecalciferol (VITAMIN D)  1000 UNITS tablet Take 1,000 Units by mouth daily.    Marland Kitchen ELIQUIS 5 MG TABS tablet TAKE ONE TABLET BY MOUTH TWICE DAILY. 60 tablet 11  . flecainide (TAMBOCOR) 50 MG tablet TAKE ONE TABLET BY MOUTH TWICE DAILY 180 tablet 1  . furosemide (LASIX) 40 MG tablet TAKE ONE TABLET BY MOUTH DAILY 90 tablet 0  . Garlic 123XX123 MG CAPS Take 1 capsule by mouth at bedtime.     . GuaiFENesin (MUCUS RELIEF ADULT PO) Take 1 tablet by mouth 2 (two) times daily.     Marland Kitchen LORazepam (ATIVAN) 1 MG tablet TAKE ONE TABLET BY MOUTH AT BEDTIME AS NEEDED FOR ANXIETY 30 tablet 3  . metoprolol (LOPRESSOR) 50 MG tablet Take 1 and 1/2 tabs by mouth twice per day 135 tablet 3  . Omega-3 Fatty Acids (FISH OIL) 1200 MG CAPS Take 1 capsule by mouth daily.     . pantoprazole (PROTONIX) 40 MG tablet TAKE ONE TABLET BY MOUTH ONCE DAILY 90 tablet 3  . potassium chloride SA (K-DUR,KLOR-CON) 20 MEQ tablet Take 1 tablet (20 mEq total) by mouth daily. 90 tablet 3  . promethazine-codeine (PHENERGAN WITH CODEINE) 6.25-10 MG/5ML syrup Take 5 mLs by mouth every 4 (four) hours as needed for cough. 180 mL 0  . escitalopram (LEXAPRO) 20 MG tablet Take 1 tablet (20 mg total) by mouth daily. (Patient taking differently: Take 20 mg by mouth at bedtime. ) 90 tablet 3  . estradiol (ESTRACE) 2 MG tablet TAKE ONE TABLET BY MOUTH ONCE DAILY (Patient taking differently: TAKE ONE TABLET BY MOUTH ONCE every evening) 90 tablet 1  . irbesartan (AVAPRO) 150 MG tablet TAKE ONE TABLET BY MOUTH ONCE DAILY (Patient taking differently: TAKE two TABLETs (300 mg) BY MOUTH ONCE DAILY) 90 tablet 0  . pravastatin (PRAVACHOL) 40 MG tablet TAKE ONE TABLET BY MOUTH ONCE DAILY (Patient taking differently: TAKE ONE TABLET BY MOUTH ONCE every evening) 90 tablet 3   No facility-administered medications prior to visit.     Allergies:   Codeine; Tramadol hcl; and Vicodin   Social History   Social History  . Marital Status: Widowed    Spouse Name: N/A  . Number of Children: 1  .  Years of Education: N/A   Occupational History  . currently unemployed, former [employer]    Social History Main Topics  . Smoking status: Former Smoker -- 1.50 packs/day for 30 years    Types: Cigarettes    Quit date: 04/21/2003  . Smokeless tobacco: Never Used  . Alcohol Use: No  .  Drug Use: No  . Sexual Activity: Not Currently    Birth Control/ Protection: None   Other Topics Concern  . Not on file   Social History Narrative   3 caffeine drinks daily      Family History:  The patient's family history includes Colon polyps in her mother; Diabetes in her mother; Hyperlipidemia in her mother; Hypertension in her mother; Stroke in her father. There is no history of Colon cancer.   ROS:   Please see the history of present illness.    ROS All other systems reviewed and are negative.   PHYSICAL EXAM:   VS:  BP 174/96 mmHg  Pulse 59  Ht 5\' 2"  (1.575 m)  Wt 262 lb 12.8 oz (119.205 kg)  BMI 48.05 kg/m2  SpO2 97%   GEN: Well nourished, well developed, in no acute distress HEENT: normal Neck: no JVD, carotid bruits, or masses Cardiac: RRR; no murmurs, rubs, or gallops,no edema  Respiratory:  clear to auscultation bilaterally, normal work of breathing GI: soft, nontender, nondistended, + BS MS: no deformity or atrophy Skin: warm and dry, no rash Neuro:  Alert and Oriented x 3, Strength and sensation are intact Psych: euthymic mood, full affect  Wt Readings from Last 3 Encounters:  08/22/15 262 lb 12.8 oz (119.205 kg)  07/23/15 258 lb (117.028 kg)  07/08/15 250 lb (113.399 kg)      Studies/Labs Reviewed:   EKG:  EKG is NOT ordered today.    Recent Labs: 10/04/2014: TSH 3.02 04/18/2015: ALT 21 08/16/2015: BUN 7; Creatinine, Ser 0.62; Hemoglobin 14.1; Platelets 221; Potassium 4.5; Sodium 137   Lipid Panel    Component Value Date/Time   CHOL 144 04/18/2015 1511   TRIG 250.0* 04/18/2015 1511   HDL 36.10* 04/18/2015 1511   CHOLHDL 4 04/18/2015 1511    VLDL 50.0* 04/18/2015 1511   LDLCALC 50 04/04/2014 1158   LDLDIRECT 75.0 04/18/2015 1511    Additional studies/ records that were reviewed today include:   Lexiscan myoview 04/26/15 Overall Impression: Low risk stress nuclear study with small size and mild intensity fixed distal anteroapical breast attenuation artifact. LV Ejection Fraction: 44%. LV Wall Motion: Mild global hypokinesis   11/29/14  MUGA Study Impression Normal MUGA study. Nuclear stress EF: 52% (normal value = >50%). LV cavity size is normal.       2D ECHO: 02/12/2014 LV EF: 50% -  55% Study Conclusion - Left ventricle: Inferobasal hypokinesis. The cavity size was normal. Wall thickness was normal. Systolic function was normal. The estimated ejection fraction was in the range of 50% to 55%. - Left atrium: The atrium was mildly dilated. - Atrial septum: No defect or patent foramen ovale was identified. - Pulmonary arteries: PA peak pressure: 41 mm Hg (S).   ASSESSMENT:    1. Chest pain, unspecified chest pain type   2. Essential hypertension   3. PAF (paroxysmal atrial fibrillation) ([place])   4. HLD (hyperlipidemia)   5. Chronic obstructive pulmonary disease, unspecified COPD type ([place])   6. Morbid obesity, unspecified obesity type ([place])   7. Pulmonary HTN ([place])      PLAN:  In order of problems listed above:  Chest pain: now completely resolved. We discussed stress test vs watchful waiting and she would prefer to not do a stress test at this time. Her chest pain was atypical and more c/w anxiety attack and she had a reassuring nuclear stress test in 04/2014 with no ischemia.   Uncontrolled HTN: BP elevated today.  She is on Avapro 300mg  daily and Lopressor 75mg  BID. Cannot increase BB due to baseline bradycardia. WIll add amlopdipine 5mg  daily. If her BP is still elevated at follow up, Dr. Johnsie Cancel can titrate up further.   PAF: sounds regular on exam today. Continue Eliquis for CHADSVASC score of  2 (HTN  and F sex). Continue BB.   HLD: continue statin   COPD: continue advair  Morbid obesity: diet and exercise.   Medication Adjustments/Labs and Tests Ordered: Current medicines are reviewed at length with the patient today.  Concerns regarding medicines are outlined above.  Medication changes, Labs and Tests ordered today are listed in the Patient Instructions below. Patient Instructions  Medication Instructions:  Your physician has recommended you make the following change in your medication:   1.  START Amlodipine 5 mg taking 1 tablet daily   Labwork: None ordered  Testing/Procedures: None ordered  Follow-Up: Your physician recommends that you schedule a follow-up appointment in: [place] Johnsie Cancel   Any Other Special Instructions Will Be Listed Below (If Applicable).     If you need a refill on your cardiac medications before your next appointment, please call your pharmacy.       Renea Ee  08/22/2015 3:32 PM    [facility] [place], [place], [place]  [address] Phone: [phone]; Fax: [phone]

## 2015-08-22 ENCOUNTER — Ambulatory Visit (INDEPENDENT_AMBULATORY_CARE_PROVIDER_SITE_OTHER): Payer: Medicare Other | Admitting: Physician Assistant

## 2015-08-22 ENCOUNTER — Other Ambulatory Visit: Payer: Self-pay | Admitting: Physician Assistant

## 2015-08-22 VITALS — BP 174/96 | HR 59 | Ht 62.0 in | Wt 262.8 lb

## 2015-08-22 DIAGNOSIS — E785 Hyperlipidemia, unspecified: Secondary | ICD-10-CM | POA: Diagnosis not present

## 2015-08-22 DIAGNOSIS — I272 Other secondary pulmonary hypertension: Secondary | ICD-10-CM

## 2015-08-22 DIAGNOSIS — I10 Essential (primary) hypertension: Secondary | ICD-10-CM | POA: Diagnosis not present

## 2015-08-22 DIAGNOSIS — R079 Chest pain, unspecified: Secondary | ICD-10-CM | POA: Diagnosis not present

## 2015-08-22 DIAGNOSIS — J449 Chronic obstructive pulmonary disease, unspecified: Secondary | ICD-10-CM

## 2015-08-22 DIAGNOSIS — I480 Paroxysmal atrial fibrillation: Secondary | ICD-10-CM | POA: Diagnosis not present

## 2015-08-22 MED ORDER — AMLODIPINE BESYLATE 5 MG PO TABS: 5.0000 mg | ORAL_TABLET | Freq: Every day | ORAL | Status: DC

## 2015-08-22 NOTE — Patient Instructions (Signed)
Medication Instructions:  Your physician has recommended you make the following change in your medication:   1.  START Amlodipine 5 mg taking 1 tablet daily   Labwork: None ordered  Testing/Procedures: None ordered  Follow-Up: Your physician recommends that you schedule a follow-up appointment in: [place] Johnsie Cancel   Any Other Special Instructions Will Be Listed Below (If Applicable).     If you need a refill on your cardiac medications before your next appointment, please call your pharmacy.

## 2015-08-28 ENCOUNTER — Encounter (HOSPITAL_COMMUNITY): Payer: Self-pay

## 2015-08-28 ENCOUNTER — Encounter (HOSPITAL_COMMUNITY)
Admission: RE | Admit: 2015-08-28 | Discharge: 2015-08-28 | Disposition: A | Discharge status: Home / Self Care | Payer: Medicare Other | Source: Ambulatory Visit | Attending: Orthopedic Surgery | Admitting: Orthopedic Surgery

## 2015-08-28 ENCOUNTER — Telehealth: Payer: Self-pay

## 2015-08-28 DIAGNOSIS — E119 Type 2 diabetes mellitus without complications: Secondary | ICD-10-CM | POA: Diagnosis not present

## 2015-08-28 DIAGNOSIS — Z6841 Body Mass Index (BMI) 40.0 and over, adult: Secondary | ICD-10-CM | POA: Diagnosis not present

## 2015-08-28 DIAGNOSIS — R2231 Localized swelling, mass and lump, right upper limb: Secondary | ICD-10-CM | POA: Diagnosis not present

## 2015-08-28 DIAGNOSIS — Z79899 Other long term (current) drug therapy: Secondary | ICD-10-CM | POA: Diagnosis not present

## 2015-08-28 DIAGNOSIS — I10 Essential (primary) hypertension: Secondary | ICD-10-CM | POA: Diagnosis not present

## 2015-08-28 DIAGNOSIS — K219 Gastro-esophageal reflux disease without esophagitis: Secondary | ICD-10-CM | POA: Diagnosis not present

## 2015-08-28 DIAGNOSIS — F419 Anxiety disorder, unspecified: Secondary | ICD-10-CM | POA: Diagnosis not present

## 2015-08-28 DIAGNOSIS — I4891 Unspecified atrial fibrillation: Secondary | ICD-10-CM | POA: Diagnosis not present

## 2015-08-28 DIAGNOSIS — J449 Chronic obstructive pulmonary disease, unspecified: Secondary | ICD-10-CM | POA: Diagnosis not present

## 2015-08-28 DIAGNOSIS — Z87891 Personal history of nicotine dependence: Secondary | ICD-10-CM | POA: Diagnosis not present

## 2015-08-28 DIAGNOSIS — Z79890 Hormone replacement therapy (postmenopausal): Secondary | ICD-10-CM | POA: Diagnosis not present

## 2015-08-28 DIAGNOSIS — E039 Hypothyroidism, unspecified: Secondary | ICD-10-CM | POA: Diagnosis not present

## 2015-08-28 DIAGNOSIS — Z7901 Long term (current) use of anticoagulants: Secondary | ICD-10-CM | POA: Diagnosis not present

## 2015-08-28 HISTORY — DX: Reserved for inherently not codable concepts without codable children: IMO0001

## 2015-08-28 LAB — BASIC METABOLIC PANEL
ANION GAP: 11 (ref 5–15)
BUN: 11 mg/dL (ref 6–20)
CALCIUM: 8.6 mg/dL — AB (ref 8.9–10.3)
CO2: 26 mmol/L (ref 22–32)
CREATININE: 0.65 mg/dL (ref 0.44–1.00)
Chloride: 103 mmol/L (ref 101–111)
GLUCOSE: 204 mg/dL — AB (ref 65–99)
Potassium: 4 mmol/L (ref 3.5–5.1)
Sodium: 140 mmol/L (ref 135–145)

## 2015-08-28 LAB — CBC
HCT: 39.1 % (ref 36.0–46.0)
Hemoglobin: 12.8 g/dL (ref 12.0–15.0)
MCH: 30.9 pg (ref 26.0–34.0)
MCHC: 32.7 g/dL (ref 30.0–36.0)
MCV: 94.4 fL (ref 78.0–100.0)
PLATELETS: 229 10*3/uL (ref 150–400)
RBC: 4.14 MIL/uL (ref 3.87–5.11)
RDW: 13.9 % (ref 11.5–15.5)
WBC: 6.7 10*3/uL (ref 4.0–10.5)

## 2015-08-28 NOTE — Progress Notes (Signed)
PCP is Cathlean Cower Cardiologist is Dr. Johnsie Cancel Also sees Dr. Radford Pax States  scheduled for sleep study 09-04-15

## 2015-08-28 NOTE — Telephone Encounter (Signed)
Please advise, patient has sleep apnea test this coming week at the hospital has not had study done yet

## 2015-08-28 NOTE — Pre-Procedure Instructions (Signed)
    Andrea Perry  08/28/2015      [facility] [place], [place] - [address] [place] [address] Phone: [phone] Fax: [phone]    Your procedure is scheduled on May 11  Report to [facility] at 600 A.M.  Call this number if you have problems the morning of surgery:  [phone]   Remember:  Do not eat food or drink liquids after midnight.  Take these medicines the morning of surgery with A SIP OF WATER tylenol if needed, Advair inhaler, albuterol inhaler if needed, amlodipine (Norvasc), cetrizine (Zyrtec), estradiol (Estrace), Eliquis, flecainide (Tambocor), guaifenesin, metoprolol (Lopressor),  Pantoprazole (Protonix)   Bring your inhalers with you on the day of procedure.   Do not wear jewelry, make-up or nail polish.  Do not wear lotions, powders, or perfumes.  You may wear deodorant.  Do not shave 48 hours prior to surgery.  Men may shave face and neck.  Do not bring valuables to the hospital.  [facility] is not responsible for any belongings or valuables.  Contacts, dentures or bridgework may not be worn into surgery.  Leave your suitcase in the car.  After surgery it may be brought to your room.  For patients admitted to the hospital, discharge time will be determined by your treatment team.  Patients discharged the day of surgery will not be allowed to drive home.   Please read over the following fact sheets that you were given. Pain Booklet, Coughing and Deep Breathing and Surgical Site Infection Prevention

## 2015-08-28 NOTE — Progress Notes (Signed)
   08/28/15 1520  OBSTRUCTIVE SLEEP APNEA  Have you ever been diagnosed with sleep apnea through a sleep study? No  Do you snore loudly (loud enough to be heard through closed doors)?  1  Do you often feel tired, fatigued, or sleepy during the daytime (such as falling asleep during driving or talking to someone)? 1  Has anyone observed you stop breathing during your sleep? 1  Do you have, or are you being treated for high blood pressure? 1  BMI more than 35 kg/m2? 1  Age > 50 (1-yes) 1  Neck circumference greater than:Female 16 inches or larger, Female 17inches or larger? 1  Female Gender (Yes=1) 0  Obstructive Sleep Apnea Score 7  Score 5 or greater  Results sent to PCP

## 2015-08-28 NOTE — Telephone Encounter (Signed)
Sorry, I dont understand what the question refers to or what is needed to be known from me

## 2015-08-29 ENCOUNTER — Ambulatory Visit (HOSPITAL_COMMUNITY)
Admission: RE | Admit: 2015-08-29 | Discharge: 2015-08-29 | Disposition: A | Discharge status: Home / Self Care | Payer: Medicare Other | Source: Ambulatory Visit | Attending: Orthopedic Surgery | Admitting: Orthopedic Surgery

## 2015-08-29 ENCOUNTER — Ambulatory Visit (HOSPITAL_COMMUNITY): Payer: Medicare Other | Admitting: Emergency Medicine

## 2015-08-29 ENCOUNTER — Encounter (HOSPITAL_COMMUNITY): Payer: Self-pay | Admitting: Surgery

## 2015-08-29 ENCOUNTER — Ambulatory Visit (HOSPITAL_COMMUNITY): Payer: Medicare Other | Admitting: Critical Care Medicine

## 2015-08-29 ENCOUNTER — Encounter (HOSPITAL_COMMUNITY)
Admission: RE | Disposition: A | Discharge status: Home / Self Care | Payer: Self-pay | Source: Ambulatory Visit | Attending: Orthopedic Surgery

## 2015-08-29 DIAGNOSIS — J449 Chronic obstructive pulmonary disease, unspecified: Secondary | ICD-10-CM | POA: Diagnosis not present

## 2015-08-29 DIAGNOSIS — F419 Anxiety disorder, unspecified: Secondary | ICD-10-CM | POA: Diagnosis not present

## 2015-08-29 DIAGNOSIS — E119 Type 2 diabetes mellitus without complications: Secondary | ICD-10-CM | POA: Insufficient documentation

## 2015-08-29 DIAGNOSIS — Z7901 Long term (current) use of anticoagulants: Secondary | ICD-10-CM | POA: Insufficient documentation

## 2015-08-29 DIAGNOSIS — Z79899 Other long term (current) drug therapy: Secondary | ICD-10-CM | POA: Insufficient documentation

## 2015-08-29 DIAGNOSIS — K219 Gastro-esophageal reflux disease without esophagitis: Secondary | ICD-10-CM | POA: Insufficient documentation

## 2015-08-29 DIAGNOSIS — Z79890 Hormone replacement therapy (postmenopausal): Secondary | ICD-10-CM | POA: Insufficient documentation

## 2015-08-29 DIAGNOSIS — R2231 Localized swelling, mass and lump, right upper limb: Secondary | ICD-10-CM | POA: Insufficient documentation

## 2015-08-29 DIAGNOSIS — E039 Hypothyroidism, unspecified: Secondary | ICD-10-CM | POA: Insufficient documentation

## 2015-08-29 DIAGNOSIS — Z87891 Personal history of nicotine dependence: Secondary | ICD-10-CM | POA: Diagnosis not present

## 2015-08-29 DIAGNOSIS — I10 Essential (primary) hypertension: Secondary | ICD-10-CM | POA: Insufficient documentation

## 2015-08-29 DIAGNOSIS — Z6841 Body Mass Index (BMI) 40.0 and over, adult: Secondary | ICD-10-CM | POA: Insufficient documentation

## 2015-08-29 DIAGNOSIS — I4891 Unspecified atrial fibrillation: Secondary | ICD-10-CM | POA: Insufficient documentation

## 2015-08-29 HISTORY — PX: RADIOLOGY WITH ANESTHESIA: SHX6223

## 2015-08-29 LAB — GLUCOSE, CAPILLARY: GLUCOSE-CAPILLARY: 131 mg/dL — AB (ref 65–99)

## 2015-08-29 SURGERY — RADIOLOGY WITH ANESTHESIA
Anesthesia: General | Laterality: Right

## 2015-08-29 MED ORDER — LACTATED RINGERS IV SOLN
INTRAVENOUS | Status: DC
  Administered 2015-08-29: 08:00:00 via INTRAVENOUS

## 2015-08-29 MED ORDER — GADOBENATE DIMEGLUMINE 529 MG/ML IV SOLN
20.0000 mL | Freq: Once | INTRAVENOUS | Status: AC
  Administered 2015-08-29: 20 mL via INTRAVENOUS

## 2015-08-29 NOTE — Transfer of Care (Signed)
Immediate Anesthesia Transfer of Care Note  Patient: Andrea Perry  Procedure(s) Performed: Procedure(s): MRI - RIGHT FOREARM (Right)  Patient Location: PACU  Anesthesia Type:General  Level of Consciousness: awake, alert  and oriented  Airway & Oxygen Therapy: Patient Spontanous Breathing and Patient connected to nasal cannula oxygen  Post-op Assessment: Report given to RN, Post -op Vital signs reviewed and stable and Patient moving all extremities X 4  Post vital signs: Reviewed and stable  Last Vitals:  Filed Vitals:   08/29/15 0628 08/29/15 1035  BP: 187/80 145/70  Pulse: 67 72  Temp: 37.2 C 37.1 C  Resp: 20 16    Last Pain: There were no vitals filed for this visit.       Complications: No apparent anesthesia complications

## 2015-08-29 NOTE — Anesthesia Postprocedure Evaluation (Signed)
Anesthesia Post Note  Patient: Andrea Perry  Procedure(s) Performed: Procedure(s) (LRB): MRI - RIGHT FOREARM (Right)  Patient location during evaluation: PACU Anesthesia Type: General Level of consciousness: awake and alert and patient cooperative Pain management: pain level controlled Vital Signs Assessment: post-procedure vital signs reviewed and stable Respiratory status: spontaneous breathing and respiratory function stable Cardiovascular status: stable Anesthetic complications: no    Last Vitals:  Filed Vitals:   08/29/15 1105 08/29/15 1123  BP: 122/53 167/70  Pulse: 64 62  Temp: 37 C   Resp: 12     Last Pain: There were no vitals filed for this visit.               [place]

## 2015-08-29 NOTE — Anesthesia Procedure Notes (Signed)
Procedure Name: LMA Insertion Date/Time: 08/29/2015 8:42 AM Performed by: Merrilyn Puma B Pre-anesthesia Checklist: Patient identified, Emergency Drugs available, Suction available, Patient being monitored and Timeout performed Patient Re-evaluated:Patient Re-evaluated prior to inductionOxygen Delivery Method: Circle system utilized Preoxygenation: Pre-oxygenation with 100% oxygen Intubation Type: IV induction Ventilation: Mask ventilation without difficulty LMA: LMA inserted LMA Size: 4.0 Number of attempts: 1 Placement Confirmation: positive ETCO2 and breath sounds checked- equal and bilateral Tube secured with: Tape Dental Injury: Teeth and Oropharynx as per pre-operative assessment

## 2015-08-29 NOTE — Anesthesia Preprocedure Evaluation (Signed)
Anesthesia Evaluation  Patient identified by MRN, date of birth, ID band Patient awake    Reviewed: Allergy & Precautions, NPO status , Patient's Chart, lab work & pertinent test results  Airway Mallampati: II   Neck ROM: full    Dental   Pulmonary asthma , COPD, former smoker,    breath sounds clear to auscultation       Cardiovascular hypertension, + dysrhythmias Atrial Fibrillation  Rhythm:irregular Rate:Normal     Neuro/Psych Anxiety Depression    GI/Hepatic hiatal hernia, GERD  ,  Endo/Other  diabetes, Type 2Hypothyroidism Morbid obesity  Renal/GU      Musculoskeletal  (+) Arthritis ,   Abdominal   Peds  Hematology   Anesthesia Other Findings   Reproductive/Obstetrics                             Anesthesia Physical Anesthesia Plan  ASA: III  Anesthesia Plan: General   Post-op Pain Management:    Induction: Intravenous  Airway Management Planned: LMA  Additional Equipment:   Intra-op Plan:   Post-operative Plan:   Informed Consent: I have reviewed the patients History and Physical, chart, labs and discussed the procedure including the risks, benefits and alternatives for the proposed anesthesia with the patient or authorized representative who has indicated his/her understanding and acceptance.     Plan Discussed with: CRNA, Anesthesiologist and Surgeon  Anesthesia Plan Comments:         Anesthesia Quick Evaluation

## 2015-08-30 ENCOUNTER — Encounter (HOSPITAL_COMMUNITY): Payer: Self-pay | Admitting: Radiology

## 2015-08-30 MED FILL — Ondansetron HCl Inj 4 MG/2ML (2 MG/ML): INTRAMUSCULAR | Qty: 2 | Status: AC

## 2015-08-30 MED FILL — Ephedrine Sulfate Inj 50 MG/ML: INTRAMUSCULAR | Qty: 2 | Status: AC

## 2015-08-30 MED FILL — Midazolam HCl Inj 2 MG/2ML (Base Equivalent): INTRAMUSCULAR | Qty: 2 | Status: AC

## 2015-08-30 MED FILL — Lactated Ringer's Solution: INTRAVENOUS | Qty: 1000 | Status: AC

## 2015-08-30 MED FILL — Glycopyrrolate Inj 0.2 MG/ML: INTRAMUSCULAR | Qty: 1 | Status: AC

## 2015-08-30 MED FILL — Lidocaine HCl IV Inj 20 MG/ML: INTRAVENOUS | Qty: 5 | Status: AC

## 2015-08-30 MED FILL — Propofol IV Emul 200 MG/20ML (10 MG/ML): INTRAVENOUS | Qty: 20 | Status: AC

## 2015-09-04 ENCOUNTER — Ambulatory Visit (HOSPITAL_BASED_OUTPATIENT_CLINIC_OR_DEPARTMENT_OTHER): Payer: Medicare Other | Attending: Cardiology | Admitting: Cardiology

## 2015-09-04 DIAGNOSIS — R0683 Snoring: Secondary | ICD-10-CM | POA: Diagnosis not present

## 2015-09-04 DIAGNOSIS — G4710 Hypersomnia, unspecified: Secondary | ICD-10-CM | POA: Insufficient documentation

## 2015-09-04 DIAGNOSIS — G4720 Circadian rhythm sleep disorder, unspecified type: Secondary | ICD-10-CM | POA: Diagnosis not present

## 2015-09-04 DIAGNOSIS — G4719 Other hypersomnia: Secondary | ICD-10-CM

## 2015-09-04 DIAGNOSIS — I480 Paroxysmal atrial fibrillation: Secondary | ICD-10-CM | POA: Diagnosis not present

## 2015-09-04 HISTORY — PX: SPLIT NIGHT STUDY: SLE1000

## 2015-09-05 ENCOUNTER — Ambulatory Visit: Payer: Medicare Other | Admitting: Emergency Medicine

## 2015-09-06 ENCOUNTER — Other Ambulatory Visit: Payer: Self-pay | Admitting: Internal Medicine

## 2015-09-07 ENCOUNTER — Other Ambulatory Visit: Payer: Self-pay | Admitting: Internal Medicine

## 2015-09-09 MED ORDER — ALBUTEROL SULFATE HFA 108 (90 BASE) MCG/ACT IN AERS
2.0000 | INHALATION_SPRAY | Freq: Four times a day (QID) | RESPIRATORY_TRACT | Status: DC | PRN
Start: 2015-09-09 — End: 2017-01-18

## 2015-09-09 NOTE — Addendum Note (Signed)
Addended by: Earnstine Regal on: 09/09/2015 11:33 AM   Modules accepted: Orders

## 2015-09-16 ENCOUNTER — Telehealth: Payer: Self-pay | Admitting: Cardiology

## 2015-09-16 ENCOUNTER — Encounter (HOSPITAL_BASED_OUTPATIENT_CLINIC_OR_DEPARTMENT_OTHER): Payer: Self-pay | Admitting: Cardiology

## 2015-09-16 NOTE — Telephone Encounter (Signed)
Please let patient know that sleep study showed no significant sleep apnea.    

## 2015-09-16 NOTE — Procedures (Signed)
   Patient Name: Andrea Perry, Andrea Perry MRN: [id number] Study Date: 09/04/2015 Gender: Female D.O.B: [birth year] Age (years): 63 Referring Provider: Fransico Him MD, ABSM Interpreting Physician: Fransico Him MD, ABSM RPSGT: Joni Reining  Weight (lbs): 250 BMI: 46 Neck Size: 17.00 Height (inches): 62  CLINICAL INFORMATION Sleep Study Type: NPSG Indication for sleep study: Excessive Daytime Sleepiness, Snoring Epworth Sleepiness Score: 9  SLEEP STUDY TECHNIQUE As per the AASM Manual for the Scoring of Sleep and Associated Events v2.3 (April 2016) with a hypopnea requiring 4% desaturations. The channels recorded and monitored were frontal, central and occipital EEG, electrooculogram (EOG), submentalis EMG (chin), nasal and oral airflow, thoracic and abdominal wall motion, anterior tibialis EMG, snore microphone, electrocardiogram, and pulse oximetry.  MEDICATIONS Patient's medications include: Reviewed in the chart. Medications self-administered by patient during sleep study : No sleep medicine administered.  SLEEP ARCHITECTURE The study was initiated at 11:00:01 PM and ended at 5:04:19 AM. Sleep onset time was 43.3 minutes and the sleep efficiency was reduced at 70.3%. The total sleep time was 256.0 minutes. Stage REM latency was prolonged at 215.5 minutes. The patient spent 11.91% of the night in stage N1 sleep, 84.77% in stage N2 sleep, 0.00% in stage N3 and 3.32% in REM. Alpha intrusion was absent. Supine sleep was 92.19%.  RESPIRATORY PARAMETERS The overall apnea/hypopnea index (AHI) was 4.9 per hour. There were 4 total apneas, including 4 obstructive, 0 central and 0 mixed apneas. There were 17 hypopneas and 4 RERAs. The AHI during Stage REM sleep was 0.0 per hour. AHI while supine was 5.3 per hour. The mean oxygen saturation was 94.14%. The minimum SpO2 during sleep was 87.00%. Moderate snoring was noted during this study.  CARDIAC DATA The 2 lead EKG demonstrated sinus  rhythm. The mean heart rate was 64.24 beats per minute. Other EKG findings include: None.  LEG MOVEMENT DATA The total PLMS were 602 with a resulting PLMS index of 141.09. Associated arousal with leg movement index was 19.5 .  IMPRESSIONS - No significant obstructive sleep apnea occurred during this study (AHI = 4.9/h). - No significant central sleep apnea occurred during this study (CAI = 0.0/h). - Mild oxygen desaturation was noted during this study (Min O2 = 87.00%). - The patient snored with Moderate snoring volume. - No cardiac abnormalities were noted during this study. - Severe periodic limb movements of sleep occurred during the study. Associated arousals were significant.  DIAGNOSIS - Excessive daytime sleepiness  RECOMMENDATIONS - Avoid alcohol, sedatives and other CNS depressants that may result in sleep apnea and disrupt normal sleep architecture. - Sleep hygiene should be reviewed to assess factors that may improve sleep quality. - Weight management and regular exercise should be initiated or continued if appropriate.  [place], American Board of Sleep Medicine  ELECTRONICALLY SIGNED ON:  09/16/2015, 11:51 AM [place] PH: [phone]   FX: [phone] [place]

## 2015-09-17 ENCOUNTER — Other Ambulatory Visit: Payer: Self-pay | Admitting: *Deleted

## 2015-09-17 MED ORDER — FLECAINIDE ACETATE 50 MG PO TABS: 50.0000 mg | ORAL_TABLET | Freq: Two times a day (BID) | ORAL | Status: DC

## 2015-09-17 NOTE — Telephone Encounter (Signed)
Patient informed of sleep study results. Stated verbal understanding.

## 2015-09-20 ENCOUNTER — Other Ambulatory Visit: Payer: Self-pay | Admitting: *Deleted

## 2015-09-20 MED ORDER — PRAVASTATIN SODIUM 40 MG PO TABS: 40.0000 mg | ORAL_TABLET | Freq: Every day | ORAL | Status: DC

## 2015-09-20 MED ORDER — PANTOPRAZOLE SODIUM 40 MG PO TBEC: 40.0000 mg | DELAYED_RELEASE_TABLET | Freq: Every day | ORAL | Status: DC

## 2015-09-20 MED ORDER — FLUTICASONE-SALMETEROL 500-50 MCG/DOSE IN AEPB: INHALATION_SPRAY | RESPIRATORY_TRACT | Status: DC

## 2015-09-20 MED ORDER — AMLODIPINE BESYLATE 5 MG PO TABS: 5.0000 mg | ORAL_TABLET | Freq: Every day | ORAL | Status: DC

## 2015-09-20 NOTE — Telephone Encounter (Signed)
Receive call pt states she is needing refills on several medications. Verified which medication she is needing inform sending Advair, Amlodipine, lasix  And Pantoprazole to [facility]!

## 2015-09-25 ENCOUNTER — Other Ambulatory Visit: Payer: Self-pay | Admitting: Internal Medicine

## 2015-10-02 ENCOUNTER — Ambulatory Visit: Payer: Medicare Other | Admitting: Cardiovascular Disease

## 2015-10-07 ENCOUNTER — Ambulatory Visit (INDEPENDENT_AMBULATORY_CARE_PROVIDER_SITE_OTHER): Payer: Medicare Other | Admitting: Cardiovascular Disease

## 2015-10-07 ENCOUNTER — Encounter: Payer: Self-pay | Admitting: Cardiovascular Disease

## 2015-10-07 VITALS — BP 150/80 | HR 60 | Ht 62.0 in | Wt 266.4 lb

## 2015-10-07 DIAGNOSIS — I10 Essential (primary) hypertension: Secondary | ICD-10-CM | POA: Diagnosis not present

## 2015-10-07 MED ORDER — METOPROLOL TARTRATE 50 MG PO TABS: ORAL_TABLET | ORAL | Status: DC

## 2015-10-07 MED ORDER — AMLODIPINE BESYLATE 10 MG PO TABS: 10.0000 mg | ORAL_TABLET | Freq: Every day | ORAL | Status: DC

## 2015-10-07 NOTE — Patient Instructions (Signed)
Medication Instructions:  INCREASE Norvasc (Amlodipine) to 10 mg once daily A 90 day supply of your metoprolol has been sent to your pharmacy   Labwork: None Ordered   Testing/Procedures: None Ordered   Follow-Up: Your physician wants you to follow-up in: 6 months with Dr. Johnsie Cancel.  You will receive a reminder letter in the mail two months in advance. If you don't receive a letter, please call our office to schedule the follow-up appointment.   If you need a refill on your cardiac medications before your next appointment, please call your pharmacy.   Thank you for choosing [facility]! Christen Bame, RN [phone]

## 2015-10-07 NOTE — Progress Notes (Signed)
Cardiology Office Note    Date:  10/07/2015   ID:  Andrea Perry, DOB 05-12-1950, MRN [id number]  PCP:  Cathlean Cower, MD   Cardiologist: Dr. Johnsie Cancel Dr Radford Pax ([facility])   Post ER follow up.   History of Present Illness:  Andrea Perry is a 65 y.o. female with a history of severe COPD, HTN, HLD, PAF on Eliquis, pulmonary HTN and morbid obesity who presents to clinic for follow up.   Failed DCCV on 03/20/14 started on flecainide. She was unable to do ETT due to arthritis. She did do a Lexiscan1/2016 which showed no ischemia or infarction. EF was 40% but follow up MUGA test showed normal EF of 52%. She continues on Flecaide 50mg  BID. She underwent successful DCCV on 10/30/14 and done well since that time.   She was seen by Dr. Heron Nay for evaluation of possible OSA in 06/2014. A sleep study was ordered. And negative   She was seen in the ER on 08/16/15 for burning chest pain. She ruled out for MI and ECG without afib. She described an episode of warm sensation across her chest in the setting of increased emotional stress felt c/w a panic attack. She was sent home with close cardiology follow up.   Today she presents to clinic for follow up.  At home she checks her BP and usually SBP 130-150, DBP 80-90. She has not had anymore chest pain since ER visit. She does not formally exercise. She does do a lot of house work and denies chest pain but does get DOE, which is chronic for her due to her COPD. No LE edema, orthopnea or PND. No dizziness or syncope. No blood in stool or urine. No palpitations.   Amlodipine added last visit for BP     Past Medical History  Diagnosis Date  . Impaired glucose tolerance 09/21/2010  . ALLERGIC RHINITIS 08/16/2008  . ANXIETY 08/16/2008  . ASTHMA 08/16/2008  . COPD 08/16/2008  . DEPRESSION 08/16/2008  . GENITAL HERPES 12/03/2006  . HEMATOCHEZIA 06/20/2009  . HYPERLIPIDEMIA 08/16/2008  . HYPERTENSION 12/03/2006  . IBS 06/20/2009    hx per patient, no current problem  .  Hemorrhoids   . HYPOTHYROIDISM 12/03/2006    History only - no current problem  . GERD (gastroesophageal reflux disease)   . H/O hiatal hernia   . Arthritis     hands, knees, lower back  . Chronic lower back pain     problems with disc L2-5  . Dysrhythmia     hx AF  . History of cardioversion 10/30/14  . Pulmonary HTN ([place]) 06/19/2015  . Excessive daytime sleepiness 06/19/2015  . Snoring 06/19/2015  . Shortness of breath dyspnea     Past Surgical History  Procedure Laterality Date  . Appendectomy    . Knee surgery      Right   . Tonsillectomy and adenoidectomy    . Abdominal hysterectomy    . Carpal tunnel release      Right   . Cesarean section      x 1   . Colonoscopy  2004  . Cystocele repair N/A 10/25/2012    Procedure: ANTERIOR REPAIR (CYSTOCELE);  Surgeon: Gus Height, MD;  Location: [facility];  Service: Gynecology;  Laterality: N/A;  . Cardioversion N/A 03/20/2014    Procedure: CARDIOVERSION;  Surgeon: Josue Hector, MD;  Location: [facility];  Service: Cardiovascular;  Laterality: N/A;  . Cardioversion N/A 10/30/2014    Procedure: CARDIOVERSION;  Surgeon: Collier Salina  Tommy Rainwater, MD;  Location: [facility];  Service: Cardiovascular;  Laterality: N/A;  . Radiology with anesthesia Right 11/01/2014    Procedure: MRI RIGHT FOREARM;  Surgeon: Medication Radiologist, MD;  Location: [facility];  Service: Radiology;  Laterality: Right;  . Radiology with anesthesia Right 08/29/2015    Procedure: MRI - RIGHT FOREARM;  Surgeon: Medication Radiologist, MD;  Location: [facility];  Service: Radiology;  Laterality: Right;  . Split night study  09/04/2015    Current Medications: Outpatient Prescriptions Prior to Visit  Medication Sig Dispense Refill  . acetaminophen (TYLENOL) 650 MG CR tablet Take 1,300 mg by mouth 2 (two) times daily.    Marland Kitchen albuterol (PROVENTIL HFA;VENTOLIN HFA) 108 (90 BASE) MCG/ACT inhaler Inhale 2 puffs into the lungs every 6 (six) hours as needed for wheezing or shortness of breath.    Marland Kitchen  albuterol (VENTOLIN HFA) 108 (90 Base) MCG/ACT inhaler Inhale 2 puffs into the lungs every 6 (six) hours as needed for wheezing or shortness of breath. Yearly physical due in June must see MD for refills 54 each 0  . b complex vitamins tablet Take 1 tablet by mouth daily.    Marland Kitchen BLACK COHOSH EXTRACT PO Take 40 mg by mouth daily.    . cetirizine (ZYRTEC) 10 MG tablet Take 10 mg by mouth daily.     . cholecalciferol (VITAMIN D) 1000 UNITS tablet Take 1,000 Units by mouth daily.    Marland Kitchen ELIQUIS 5 MG TABS tablet TAKE ONE TABLET BY MOUTH TWICE DAILY. 60 tablet 11  . escitalopram (LEXAPRO) 20 MG tablet Take 20 mg by mouth at bedtime.    Marland Kitchen estradiol (ESTRACE) 2 MG tablet TAKE ONE TABLET BY MOUTH ONCE DAILY 90 tablet 0  . flecainide (TAMBOCOR) 50 MG tablet Take 1 tablet (50 mg total) by mouth 2 (two) times daily. 180 tablet 3  . Fluticasone-Salmeterol (ADVAIR DISKUS) 500-50 MCG/DOSE AEPB INHALE ONE PUFF INTO THE LUNGS TWICE DAILY 60 each 5  . furosemide (LASIX) 40 MG tablet TAKE ONE TABLET BY MOUTH DAILY 90 tablet 0  . Garlic 123XX123 MG CAPS Take 1 capsule by mouth at bedtime.     . GuaiFENesin (MUCUS RELIEF ADULT PO) Take 1 tablet by mouth 2 (two) times daily.     . irbesartan (AVAPRO) 300 MG tablet Take 300 mg by mouth daily.    Marland Kitchen LORazepam (ATIVAN) 1 MG tablet TAKE ONE TABLET BY MOUTH AT BEDTIME AS NEEDED FOR ANXIETY 30 tablet 3  . metoprolol (LOPRESSOR) 50 MG tablet Take 1 and 1/2 tabs by mouth twice per day 135 tablet 3  . Omega-3 Fatty Acids (FISH OIL) 1200 MG CAPS Take 1 capsule by mouth daily.     . pantoprazole (PROTONIX) 40 MG tablet TAKE ONE TABLET BY MOUTH ONCE DAILY 90 tablet 3  . potassium chloride SA (K-DUR,KLOR-CON) 20 MEQ tablet Take 1 tablet (20 mEq total) by mouth daily. 90 tablet 3  . pravastatin (PRAVACHOL) 40 MG tablet TAKE ONE TABLET BY MOUTH ONCE DAILY 90 tablet 0  . promethazine-codeine (PHENERGAN WITH CODEINE) 6.25-10 MG/5ML syrup Take 5 mLs by mouth every 4 (four) hours as needed for  cough. 180 mL 0  . amLODipine (NORVASC) 5 MG tablet Take 1 tablet (5 mg total) by mouth daily. Keep June appt for future refills 90 tablet 0  . estradiol (ESTRACE) 2 MG tablet Take 2 mg by mouth daily.    . irbesartan (AVAPRO) 150 MG tablet TAKE ONE TABLET BY MOUTH ONCE DAILY 90 tablet  0  . pantoprazole (PROTONIX) 40 MG tablet Take 1 tablet (40 mg total) by mouth daily. Keep June appt for future refills 90 tablet 0  . pravastatin (PRAVACHOL) 40 MG tablet Take 1 tablet (40 mg total) by mouth daily. Keep June appt for future refills 90 tablet 0   No facility-administered medications prior to visit.     Allergies:   Codeine; Tramadol hcl; and Vicodin   Social History   Social History  . Marital Status: Widowed    Spouse Name: N/A  . Number of Children: 1  . Years of Education: N/A   Occupational History  . currently unemployed, former [employer]    Social History Main Topics  . Smoking status: Former Smoker -- 1.50 packs/day for 30 years    Types: Cigarettes    Quit date: 04/21/2003  . Smokeless tobacco: Never Used  . Alcohol Use: No  . Drug Use: No  . Sexual Activity: Not Currently    Birth Control/ Protection: None   Other Topics Concern  . None   Social History Narrative   3 caffeine drinks daily      Family History:  The patient's family history includes Colon polyps in her mother; Diabetes in her mother; Hyperlipidemia in her mother; Hypertension in her mother; Stroke in her father. There is no history of Colon cancer.   ROS:   Please see the history of present illness.    ROS All other systems reviewed and are negative.   PHYSICAL EXAM:   VS:  BP 150/80 mmHg  Pulse 60  Ht 5\' 2"  (1.575 m)  Wt 266 lb 6.4 oz (120.838 kg)  BMI 48.71 kg/m2  SpO2 95%   GEN: Well nourished, well developed, in no acute distress HEENT: normal Neck: no JVD, carotid bruits, or masses Cardiac: RRR; no murmurs, rubs, or gallops,no edema  Respiratory:  clear to  auscultation bilaterally, normal work of breathing GI: soft, nontender, nondistended, + BS MS: no deformity or atrophy Skin: warm and dry, no rash Neuro:  Alert and Oriented x 3, Strength and sensation are intact Psych: euthymic mood, full affect  Wt Readings from Last 3 Encounters:  10/07/15 266 lb 6.4 oz (120.838 kg)  09/04/15 250 lb (113.399 kg)  08/29/15 262 lb 13 oz (119.211 kg)      Studies/Labs Reviewed:   EKG:    2015-09-02  SR rate 65 normal   Recent Labs: 04/18/2015: ALT 21 08/28/2015: BUN 11; Creatinine, Ser 0.65; Hemoglobin 12.8; Platelets 229; Potassium 4.0; Sodium 140   Lipid Panel    Component Value Date/Time   CHOL 144 04/18/2015 1511   TRIG 250.0* 04/18/2015 1511   HDL 36.10* 04/18/2015 1511   CHOLHDL 4 04/18/2015 1511   VLDL 50.0* 04/18/2015 1511   LDLCALC 50 04/04/2014 1158   LDLDIRECT 75.0 04/18/2015 1511    Additional studies/ records that were reviewed today include:   Lexiscan myoview 04/26/15 Overall Impression: Low risk stress nuclear study with small size and mild intensity fixed distal anteroapical breast attenuation artifact. LV Ejection Fraction: 44%. LV Wall Motion: Mild global hypokinesis   11/29/14  MUGA Study Impression Normal MUGA study. Nuclear stress EF: 52% (normal value = >50%). LV cavity size is normal.       2D ECHO: 02/12/2014 LV EF: 50% -  55% Study Conclusion - Left ventricle: Inferobasal hypokinesis. The cavity size was normal. Wall thickness was normal. Systolic function was normal. The estimated ejection fraction was in the range of 50%  to 55%. - Left atrium: The atrium was mildly dilated. - Atrial septum: No defect or patent foramen ovale was identified. - Pulmonary arteries: PA peak pressure: 41 mm Hg (S).   ASSESSMENT:    1. Essential hypertension      PLAN:  In order of problems listed above:  Chest pain: now completely resolved.. Her chest pain was atypical and more c/w anxiety attack and she had a  reassuring nuclear stress test in 04/2014 with no ischemia.   Uncontrolled HTN: increase norvasc to 10 mg f/u with me in 6 months   PAF: sounds regular on exam today. Continue Eliquis for CHADSVASC score of  2 (HTN and F sex). Continue BB. And tambacor  HLD: continue statin   COPD: continue advair  Morbid obesity: diet and exercise.   Jenkins Rouge

## 2015-10-10 ENCOUNTER — Encounter: Payer: Self-pay | Admitting: Emergency Medicine

## 2015-10-10 ENCOUNTER — Ambulatory Visit (INDEPENDENT_AMBULATORY_CARE_PROVIDER_SITE_OTHER): Payer: Medicare Other | Admitting: Emergency Medicine

## 2015-10-10 VITALS — BP 152/88 | HR 76 | Ht 62.0 in | Wt 263.0 lb

## 2015-10-10 DIAGNOSIS — J438 Other emphysema: Secondary | ICD-10-CM | POA: Diagnosis not present

## 2015-10-10 MED ORDER — AEROCHAMBER MV MISC: Status: DC

## 2015-10-10 NOTE — Patient Instructions (Signed)
We will stop Advair for now.  Start Symbicort 2 puffs twice a day with a spacer to see if this helps with your cough and throat symptoms. If this helps then we will change the medication with your pharmacy.  Continue zyrtec  We will consider adding a nasal steroid spray in the future depending on your progress.  Follow with Dr Lamonte Sakai in 2 -3 months or sooner if you have any problems.

## 2015-10-10 NOTE — Assessment & Plan Note (Signed)
She is having mainly upper airway symptoms with cough and throat irritation. Note that her Avapro was increased recently which could be a factor. Also consider refractory rhinitis on her Zyrtec. Finally consider contribution of powdered inhaled medication. I would like to try changing her to Symbicort to see if she benefits. If so then we will change her Advair to this. Consider addition of nasal steroid if she continues to have symptoms. Continue albuterol when necessary

## 2015-10-10 NOTE — Progress Notes (Signed)
Subjective:    Patient ID: Andrea Perry, female    DOB: 24-Oct-1950, 65 y.o.   MRN: [id number]  HPI 65 yo woman, hx of childhood asthma, hx tobacco (45 pk-yrs), allergies, GERD, carries a dx of COPD and adult asthma made by Dr Jenny Reichmann. She has worked in a [employer] and had to have spirometry as a part of her job. She is referred today for her COPD and also for pre-op eval for L knee surgery w Dr Noemi Chapel. She describes some exertional dyspnea, happens after 59ft. She has some occasional cough, non-productive. She sometimes hears wheeze. She has had episodes of light-headedness when walking an extended distance.   ROV 02/14/14 -- follow-up visit for COPD and history of childhood asthma. She was also recently diagnosed with atrial fibrillation - on eliquis and metoprolol. Last visit we added Spiriva to her Advair to see if she will benefit. She believed that she benefited from the [place], but note that she was having work done on her A fib and rate control at the same time.   ROV 08/28/14 -- Follow-up visit for COPD and a history of asthma. She described variably in her breathing, good days and bad days. She has gained about 10 lbs. She has exertional SOB, especially w housework but not every time. She is on Advair bid, ventolin prn > averages 3x a week. She doesn't seem to miss spiriva. She has occasional coughing. She remains on zyrtec and omeprazole   ROV 03/08/15 -- follow-up visit for asthmatic COPD in the setting of prior tobacco use, cotton dust exposure and also childhood asthma. She is currently managed on Advair 500/50 twice a day, albuterol when necessary which she uses about 3-4x a weeks. She's also been seen by Dr. Johnsie Cancel for hypertension and atrial fibrillation, cardioverted in July. She has stayed in NSR on flecainide. She still has some exertional dyspnea on some days, not all. Notices with chores. She has a new cough that started  A few months ago - note avapro added at that time. She is  sneezing some, on zyrtec.   ROV 10/10/15 -- patient with a history of COPD with asthmatic features, history of tobacco use and cotton dust exposure. She had acute exacerbation with bronchitis in January 2017 which was treated in our office. 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 She continues to have exertional dyspnea, has to stop to rest w walking sometimes.  She is having some throat irritation, cough has been a bit worse since increasing her avapro. She has some nasal congestion, throat clearing. No significant wheeze, chest tightness. She is tolerating Advair w some UA irritation.   Review of Systems  Constitutional: Negative for fever and unexpected weight change.  HENT: Negative for congestion, dental problem, ear pain, nosebleeds, postnasal drip, rhinorrhea, sinus pressure, sneezing, sore throat and trouble swallowing.   Eyes: Negative for redness and itching.  Respiratory: Positive for cough and shortness of breath. Negative for chest tightness and wheezing.   Cardiovascular: Negative for palpitations and leg swelling.  Gastrointestinal: Negative for nausea and vomiting.  Genitourinary: Negative for dysuria.  Musculoskeletal: Negative for joint swelling.  Skin: Negative for rash.  Neurological: Negative for headaches.  Hematological: Does not bruise/bleed easily.  Psychiatric/Behavioral: Negative for dysphoric mood. The patient is not nervous/anxious.        Objective:   Physical Exam Filed Vitals:   10/10/15 1133  BP: 152/88  Pulse: 76  Height: 5\' 2"  (1.575 m)  Weight: 263 lb (119.296 kg)  SpO2: 96%   Gen: Pleasant, well-nourished, in no distress,  normal affect  ENT: No lesions,  mouth clear,  oropharynx clear, no postnasal drip  Neck: No JVD, no TMG, no carotid bruits  Lungs: No use of accessory muscles, no dullness to percussion, clear without rales or  rhonchi  Cardiovascular: RRR, heart sounds normal, no murmur or gallops, no peripheral edema  Musculoskeletal: No deformities, no cyanosis or clubbing  Neuro: alert, non focal  Skin: Warm, no lesions or rashes      Assessment & Plan:  COPD (chronic obstructive pulmonary disease) She is having mainly upper airway symptoms with cough and throat irritation. Note that her Avapro was increased recently which could be a factor. Also consider refractory rhinitis on her Zyrtec. Finally consider contribution of powdered inhaled medication. I would like to try changing her to Symbicort to see if she benefits. If so then we will change her Advair to this. Consider addition of nasal steroid if she continues to have symptoms. Continue albuterol when necessary   Baltazar Apo, MD, PhD 10/10/2015, 11:59 AM [facility] [phone] or if no answer [phone]

## 2015-10-16 ENCOUNTER — Ambulatory Visit (INDEPENDENT_AMBULATORY_CARE_PROVIDER_SITE_OTHER): Payer: Medicare Other | Admitting: Internal Medicine

## 2015-10-16 ENCOUNTER — Encounter: Payer: Self-pay | Admitting: Internal Medicine

## 2015-10-16 ENCOUNTER — Other Ambulatory Visit (INDEPENDENT_AMBULATORY_CARE_PROVIDER_SITE_OTHER): Payer: Medicare Other

## 2015-10-16 VITALS — BP 140/80 | HR 68 | Temp 98.6°F | Resp 20 | Wt 261.0 lb

## 2015-10-16 DIAGNOSIS — Z0001 Encounter for general adult medical examination with abnormal findings: Secondary | ICD-10-CM

## 2015-10-16 DIAGNOSIS — R7989 Other specified abnormal findings of blood chemistry: Secondary | ICD-10-CM | POA: Diagnosis not present

## 2015-10-16 DIAGNOSIS — R6889 Other general symptoms and signs: Secondary | ICD-10-CM | POA: Diagnosis not present

## 2015-10-16 DIAGNOSIS — E039 Hypothyroidism, unspecified: Secondary | ICD-10-CM

## 2015-10-16 DIAGNOSIS — E119 Type 2 diabetes mellitus without complications: Secondary | ICD-10-CM

## 2015-10-16 DIAGNOSIS — Z1159 Encounter for screening for other viral diseases: Secondary | ICD-10-CM

## 2015-10-16 DIAGNOSIS — N3281 Overactive bladder: Secondary | ICD-10-CM

## 2015-10-16 DIAGNOSIS — E785 Hyperlipidemia, unspecified: Secondary | ICD-10-CM

## 2015-10-16 DIAGNOSIS — I10 Essential (primary) hypertension: Secondary | ICD-10-CM

## 2015-10-16 LAB — HEPATIC FUNCTION PANEL
ALK PHOS: 66 U/L (ref 39–117)
ALT: 24 U/L (ref 0–35)
AST: 30 U/L (ref 0–37)
Albumin: 3.9 g/dL (ref 3.5–5.2)
BILIRUBIN DIRECT: 0.1 mg/dL (ref 0.0–0.3)
TOTAL PROTEIN: 6.8 g/dL (ref 6.0–8.3)
Total Bilirubin: 0.7 mg/dL (ref 0.2–1.2)

## 2015-10-16 LAB — CBC WITH DIFFERENTIAL/PLATELET
BASOS ABS: 0 10*3/uL (ref 0.0–0.1)
Basophils Relative: 0.4 % (ref 0.0–3.0)
Eosinophils Absolute: 0.3 10*3/uL (ref 0.0–0.7)
Eosinophils Relative: 3.4 % (ref 0.0–5.0)
HCT: 39 % (ref 36.0–46.0)
Hemoglobin: 13.2 g/dL (ref 12.0–15.0)
LYMPHS ABS: 2.8 10*3/uL (ref 0.7–4.0)
Lymphocytes Relative: 34.2 % (ref 12.0–46.0)
MCHC: 33.8 g/dL (ref 30.0–36.0)
MCV: 94.9 fl (ref 78.0–100.0)
MONO ABS: 0.5 10*3/uL (ref 0.1–1.0)
MONOS PCT: 6.6 % (ref 3.0–12.0)
NEUTROS ABS: 4.5 10*3/uL (ref 1.4–7.7)
NEUTROS PCT: 55.4 % (ref 43.0–77.0)
PLATELETS: 246 10*3/uL (ref 150.0–400.0)
RBC: 4.1 Mil/uL (ref 3.87–5.11)
RDW: 14.3 % (ref 11.5–15.5)
WBC: 8.1 10*3/uL (ref 4.0–10.5)

## 2015-10-16 LAB — LDL CHOLESTEROL, DIRECT: LDL DIRECT: 141 mg/dL

## 2015-10-16 LAB — LIPID PANEL
Cholesterol: 219 mg/dL — ABNORMAL HIGH (ref 0–200)
HDL: 41.2 mg/dL (ref >39.00)
NONHDL: 177.62
Total CHOL/HDL Ratio: 5
Triglycerides: 275 mg/dL — ABNORMAL HIGH (ref 0.0–149.0)
VLDL: 55 mg/dL — ABNORMAL HIGH (ref 0.0–40.0)

## 2015-10-16 LAB — BASIC METABOLIC PANEL
BUN: 10 mg/dL (ref 6–23)
CALCIUM: 8.7 mg/dL (ref 8.4–10.5)
CHLORIDE: 96 meq/L (ref 96–112)
CO2: 33 meq/L — AB (ref 19–32)
Creatinine, Ser: 0.56 mg/dL (ref 0.40–1.20)
GFR: 116.29 mL/min (ref >60.00)
Glucose, Bld: 127 mg/dL — ABNORMAL HIGH (ref 70–99)
POTASSIUM: 4 meq/L (ref 3.5–5.1)
SODIUM: 140 meq/L (ref 135–145)

## 2015-10-16 LAB — URINALYSIS, ROUTINE W REFLEX MICROSCOPIC
KETONES UR: 15 — AB
Leukocytes, UA: NEGATIVE
Nitrite: NEGATIVE
Specific Gravity, Urine: 1.015 (ref 1.000–1.030)
Total Protein, Urine: NEGATIVE
URINE GLUCOSE: NEGATIVE
Urobilinogen, UA: 0.2 (ref 0.0–1.0)
pH: 6.5 (ref 5.0–8.0)

## 2015-10-16 LAB — HEMOGLOBIN A1C: HEMOGLOBIN A1C: 6.2 % (ref 4.6–6.5)

## 2015-10-16 LAB — MICROALBUMIN / CREATININE URINE RATIO
CREATININE, U: 174.3 mg/dL
MICROALB UR: 1.9 mg/dL (ref 0.0–1.9)
MICROALB/CREAT RATIO: 1.1 mg/g (ref 0.0–30.0)

## 2015-10-16 LAB — TSH: TSH: 5.08 u[IU]/mL — ABNORMAL HIGH (ref 0.35–4.50)

## 2015-10-16 MED ORDER — SOLIFENACIN SUCCINATE 5 MG PO TABS
5.0000 mg | ORAL_TABLET | Freq: Every day | ORAL | Status: DC
Start: 2015-10-16 — End: 2015-11-29

## 2015-10-16 NOTE — Progress Notes (Signed)
Pre visit review using our clinic review tool, if applicable. No additional management support is needed unless otherwise documented below in the visit note. 

## 2015-10-16 NOTE — Patient Instructions (Signed)
Please take all new medication as prescribed - the vesicare 5 mg   Please continue all other medications as before, and refills have been done if requested.  Please have the pharmacy call with any other refills you may need.  Please continue your efforts at being more active, low cholesterol diet, and weight control.  You are otherwise up to date with prevention measures today.  Please keep your appointments with your specialists as you may have planned  Please go to the LAB in the Basement (turn left off the elevator) for the tests to be done today  You will be contacted by phone if any changes need to be made immediately.  Otherwise, you will receive a letter about your results with an explanation, but please check with MyChart first.  Please remember to sign up for MyChart if you have not done so, as this will be important to you in the future with finding out test results, communicating by private email, and scheduling acute appointments online when needed.  Please return in 6 months, or sooner if needed, with Lab testing done 3-5 days before

## 2015-10-16 NOTE — Progress Notes (Signed)
Subjective:    Patient ID: Andrea Perry, female    DOB: February 04, 1951, 65 y.o.   MRN: [id number]  HPI  Here for wellness and f/u;  Overall doing ok;  Pt denies neurological change such as new headache, facial or extremity weakness.  Pt denies polydipsia, polyuria, or low sugar symptoms. Pt states overall good compliance with treatment and medications, good tolerability, and has been trying to follow appropriate diet.  Pt denies worsening depressive symptoms, suicidal ideation or panic. No fever, night sweats, wt loss, loss of appetite, or other constitutional symptoms.  Pt states good ability with ADL's, has low fall risk, home safety reviewed and adequate, no other significant changes in hearing or vision, and only occasionally active with exercise.   Due for colonoscopy.   Pt denies Chest pain, worsening SOB, DOE, wheezing, orthopnea, PND, worsening LE edema, palpitations, dizziness or syncope.   Advair changed to symbicort per pulm   Pt continues to have recurring LBP without change in severity, bowel or bladder change, fever, wt loss,  worsening LE pain/numbness/weakness, gait change or falls, folowed per Dr Yvonne Kendall  Denies hyper or hypo thyroid symptoms such as voice, skin or hair change.  Denies urinary symptoms such as dysuria, flank pain, hematuria or n/v, fever, chills, but has 3-4 mo worsening urinary freq and urgency, numerous trips to BR per day, with occas wetting accidents.  No fever, has several times nocturia at night.   Past Medical History  Diagnosis Date  . Impaired glucose tolerance 09/21/2010  . ALLERGIC RHINITIS 08/16/2008  . ANXIETY 08/16/2008  . ASTHMA 08/16/2008  . COPD 08/16/2008  . DEPRESSION 08/16/2008  . GENITAL HERPES 12/03/2006  . HEMATOCHEZIA 06/20/2009  . HYPERLIPIDEMIA 08/16/2008  . HYPERTENSION 12/03/2006  . IBS 06/20/2009    hx per patient, no current problem  . Hemorrhoids   . HYPOTHYROIDISM 12/03/2006    History only - no current problem  . GERD  (gastroesophageal reflux disease)   . H/O hiatal hernia   . Arthritis     hands, knees, lower back  . Chronic lower back pain     problems with disc L2-5  . Dysrhythmia     hx AF  . History of cardioversion 10/30/14  . Pulmonary HTN ([place]) 06/19/2015  . Excessive daytime sleepiness 06/19/2015  . Snoring 06/19/2015  . Shortness of breath dyspnea    Past Surgical History  Procedure Laterality Date  . Appendectomy    . Knee surgery      Right   . Tonsillectomy and adenoidectomy    . Abdominal hysterectomy    . Carpal tunnel release      Right   . Cesarean section      x 1   . Colonoscopy  2004  . Cystocele repair N/A 10/25/2012    Procedure: ANTERIOR REPAIR (CYSTOCELE);  Surgeon: Gus Height, MD;  Location: [facility];  Service: Gynecology;  Laterality: N/A;  . Cardioversion N/A 03/20/2014    Procedure: CARDIOVERSION;  Surgeon: Josue Hector, MD;  Location: [facility];  Service: Cardiovascular;  Laterality: N/A;  . Cardioversion N/A 10/30/2014    Procedure: CARDIOVERSION;  Surgeon: Josue Hector, MD;  Location: [facility];  Service: Cardiovascular;  Laterality: N/A;  . Radiology with anesthesia Right 11/01/2014    Procedure: MRI RIGHT FOREARM;  Surgeon: Medication Radiologist, MD;  Location: [facility];  Service: Radiology;  Laterality: Right;  . Radiology with anesthesia Right 08/29/2015    Procedure: MRI - RIGHT FOREARM;  Surgeon: Medication  Radiologist, MD;  Location: [facility];  Service: Radiology;  Laterality: Right;  . Split night study  09/04/2015    reports that she quit smoking about 12 years ago. Her smoking use included Cigarettes. She has a 45 pack-year smoking history. She has never used smokeless tobacco. She reports that she does not drink alcohol or use illicit drugs. family history includes Colon polyps in her mother; Diabetes in her mother; Hyperlipidemia in her mother; Hypertension in her mother; Stroke in her father. There is no history of Colon cancer. Allergies  Allergen  Reactions  . Codeine Nausea And Vomiting  . Tramadol Hcl Nausea And Vomiting    REACTION: mouth dryness, headache  . Vicodin [Hydrocodone-Acetaminophen] Nausea And Vomiting   Current Outpatient Prescriptions on File Prior to Visit  Medication Sig Dispense Refill  . acetaminophen (TYLENOL) 650 MG CR tablet Take 1,300 mg by mouth 2 (two) times daily.    Marland Kitchen albuterol (VENTOLIN HFA) 108 (90 Base) MCG/ACT inhaler Inhale 2 puffs into the lungs every 6 (six) hours as needed for wheezing or shortness of breath. Yearly physical due in June must see MD for refills 54 each 0  . amLODipine (NORVASC) 10 MG tablet Take 1 tablet (10 mg total) by mouth daily. 90 tablet 3  . b complex vitamins tablet Take 1 tablet by mouth daily.    Marland Kitchen BLACK COHOSH EXTRACT PO Take 40 mg by mouth daily.    . cetirizine (ZYRTEC) 10 MG tablet Take 10 mg by mouth daily.     . cholecalciferol (VITAMIN D) 1000 UNITS tablet Take 1,000 Units by mouth daily.    Marland Kitchen ELIQUIS 5 MG TABS tablet TAKE ONE TABLET BY MOUTH TWICE DAILY. 60 tablet 11  . escitalopram (LEXAPRO) 20 MG tablet Take 20 mg by mouth at bedtime.    Marland Kitchen estradiol (ESTRACE) 2 MG tablet TAKE ONE TABLET BY MOUTH ONCE DAILY 90 tablet 0  . flecainide (TAMBOCOR) 50 MG tablet Take 1 tablet (50 mg total) by mouth 2 (two) times daily. 180 tablet 3  . Fluticasone-Salmeterol (ADVAIR DISKUS) 500-50 MCG/DOSE AEPB INHALE ONE PUFF INTO THE LUNGS TWICE DAILY 60 each 5  . furosemide (LASIX) 40 MG tablet TAKE ONE TABLET BY MOUTH DAILY 90 tablet 0  . Garlic 123XX123 MG CAPS Take 1 capsule by mouth at bedtime.     . GuaiFENesin (MUCUS RELIEF ADULT PO) Take 1 tablet by mouth 2 (two) times daily.     . irbesartan (AVAPRO) 300 MG tablet Take 300 mg by mouth daily.    Marland Kitchen LORazepam (ATIVAN) 1 MG tablet TAKE ONE TABLET BY MOUTH AT BEDTIME AS NEEDED FOR ANXIETY 30 tablet 3  . metoprolol (LOPRESSOR) 50 MG tablet Take 1 and 1/2 tabs by mouth twice per day 270 tablet 3  . Omega-3 Fatty Acids (FISH OIL) 1200 MG  CAPS Take 1 capsule by mouth daily.     . pantoprazole (PROTONIX) 40 MG tablet TAKE ONE TABLET BY MOUTH ONCE DAILY 90 tablet 3  . potassium chloride SA (K-DUR,KLOR-CON) 20 MEQ tablet Take 1 tablet (20 mEq total) by mouth daily. 90 tablet 3  . pravastatin (PRAVACHOL) 40 MG tablet TAKE ONE TABLET BY MOUTH ONCE DAILY 90 tablet 0  . promethazine-codeine (PHENERGAN WITH CODEINE) 6.25-10 MG/5ML syrup Take 5 mLs by mouth every 4 (four) hours as needed for cough. 180 mL 0  . Spacer/Aero-Holding Chambers (AEROCHAMBER MV) inhaler Use as instructed 1 each 0   No current facility-administered medications on file prior to visit.  Review of Systems Constitutional: Negative for increased diaphoresis, or other activity, appetite or siginficant weight change other than noted HENT: Negative for worsening hearing loss, ear pain, facial swelling, mouth sores and neck stiffness.   Eyes: Negative for other worsening pain, redness or visual disturbance.  Respiratory: Negative for choking or stridor Cardiovascular: Negative for other chest pain and palpitations.  Gastrointestinal: Negative for worsening diarrhea, blood in stool, or abdominal distention Genitourinary: Negative for hematuria, flank pain or change in urine volume.  Musculoskeletal: Negative for myalgias or other joint complaints.  Skin: Negative for other color change and wound or drainage.  Neurological: Negative for syncope and numbness. other than noted Hematological: Negative for adenopathy. or other swelling Psychiatric/Behavioral: Negative for hallucinations, SI, self-injury, decreased concentration or other worsening agitation.      Objective:   Physical Exam BP 140/80 mmHg  Pulse 68  Temp(Src) 98.6 F (37 C) (Oral)  Resp 20  Wt 261 lb (118.389 kg)  SpO2 93% VS noted,  Constitutional: Pt is oriented to person, place, and time. Appears well-developed and well-nourished, in no significant distress Head: Normocephalic and atraumatic    Eyes: Conjunctivae and EOM are normal. Pupils are equal, round, and reactive to light Right Ear: External ear normal.  Left Ear: External ear normal Nose: Nose normal.  Mouth/Throat: Oropharynx is clear and moist  Neck: Normal range of motion. Neck supple. No JVD present. No tracheal deviation present or significant neck LA or mass Cardiovascular: Normal rate, regular rhythm, normal heart sounds and intact distal pulses.   Pulmonary/Chest: Effort normal and breath sounds without rales or wheezing  Abdominal: Soft. Bowel sounds are normal. NT. No HSM  Musculoskeletal: Normal range of motion. Exhibits no edema Lymphadenopathy: Has no cervical adenopathy.  Neurological: Pt is alert and oriented to person, place, and time. Pt has normal reflexes. No cranial nerve deficit. Motor grossly intact Skin: Skin is warm and dry. No rash noted or new ulcers Psychiatric:  Has normal mood and affect. Behavior is normal.  Spine nontender    Assessment & Plan:

## 2015-10-17 ENCOUNTER — Telehealth: Payer: Self-pay | Admitting: *Deleted

## 2015-10-17 LAB — HEPATITIS C ANTIBODY: HCV AB: NEGATIVE

## 2015-10-17 NOTE — Telephone Encounter (Signed)
Patient aware.

## 2015-10-17 NOTE — Telephone Encounter (Signed)
Received fax stating the rx for Vesicare 4 mg is too expensive pt states she can not afford. Med cost $410 monthly. Pt is requesting alternative...Andrea Perry

## 2015-10-17 NOTE — Telephone Encounter (Signed)
As I d/w pt at the visit, this was anticipated, and asked her to let me know which other similar med is covered with her insurance, since I would not be able to know this, thanks

## 2015-10-20 ENCOUNTER — Encounter: Payer: Self-pay | Admitting: Internal Medicine

## 2015-10-20 DIAGNOSIS — N3281 Overactive bladder: Secondary | ICD-10-CM | POA: Insufficient documentation

## 2015-10-20 HISTORY — DX: Overactive bladder: N32.81

## 2015-10-20 NOTE — Assessment & Plan Note (Signed)
stable overall by history and exam, recent data reviewed with pt, and pt to continue medical treatment as before,  to f/u any worsening symptoms or concerns Lab Results  Component Value Date   TSH 5.08* 10/16/2015

## 2015-10-20 NOTE — Assessment & Plan Note (Signed)
stable overall by history and exam, recent data reviewed with pt, and pt to continue medical treatment as before,  to f/u any worsening symptoms or concerns BP Readings from Last 3 Encounters:  10/16/15 140/80  10/10/15 152/88  10/07/15 150/80

## 2015-10-20 NOTE — Assessment & Plan Note (Signed)

## 2015-10-20 NOTE — Assessment & Plan Note (Signed)
Mild to mod, for vesicare 5 qd, chech urine studies today, ,  to f/u any worsening symptoms or concerns  In addition to the time spent performing CPE, I spent an additional 25 minutes face to face,in which greater than 50% of this time was spent in counseling and coordination of care for patient's acute illness as documented.

## 2015-10-20 NOTE — Assessment & Plan Note (Signed)
stable overall by history and exam, recent data reviewed with pt, and pt to continue medical treatment as before,  to f/u any worsening symptoms or concerns Lab Results  Component Value Date   LDLCALC 50 04/04/2014

## 2015-10-20 NOTE — Assessment & Plan Note (Signed)
stable overall by history and exam, recent data reviewed with pt, and pt to continue medical treatment as before,  to f/u any worsening symptoms or concerns Lab Results  Component Value Date   HGBA1C 6.2 10/16/2015

## 2015-11-06 ENCOUNTER — Telehealth: Payer: Self-pay | Admitting: Internal Medicine

## 2015-11-06 DIAGNOSIS — D1721 Benign lipomatous neoplasm of skin and subcutaneous tissue of right arm: Secondary | ICD-10-CM | POA: Diagnosis not present

## 2015-11-06 DIAGNOSIS — L821 Other seborrheic keratosis: Secondary | ICD-10-CM | POA: Diagnosis not present

## 2015-11-06 DIAGNOSIS — L570 Actinic keratosis: Secondary | ICD-10-CM | POA: Diagnosis not present

## 2015-11-06 DIAGNOSIS — D1801 Hemangioma of skin and subcutaneous tissue: Secondary | ICD-10-CM | POA: Diagnosis not present

## 2015-11-06 DIAGNOSIS — L738 Other specified follicular disorders: Secondary | ICD-10-CM | POA: Diagnosis not present

## 2015-11-06 DIAGNOSIS — L720 Epidermal cyst: Secondary | ICD-10-CM | POA: Diagnosis not present

## 2015-11-06 DIAGNOSIS — L820 Inflamed seborrheic keratosis: Secondary | ICD-10-CM | POA: Diagnosis not present

## 2015-11-06 MED ORDER — TOLTERODINE TARTRATE ER 4 MG PO CP24: 4.0000 mg | ORAL_CAPSULE | Freq: Every day | ORAL | Status: DC

## 2015-11-06 NOTE — Telephone Encounter (Signed)
Ok for [employer] LA 4 mg qd - done erx

## 2015-11-06 NOTE — Telephone Encounter (Signed)
Pt stated Dr. Jenny Reichmann sent in [place] but it will cost $150. Pt call her insurance they give her alternate med to see if Dr. Jenny Reichmann will approve for 1: Oxybytynin orTolterodine Tartrate ER? Please send in 90 days supply to [facility].

## 2015-11-07 NOTE — Telephone Encounter (Signed)
Patient aware of new medication

## 2015-11-11 ENCOUNTER — Encounter: Payer: Self-pay | Admitting: Sports Medicine

## 2015-11-11 ENCOUNTER — Ambulatory Visit (INDEPENDENT_AMBULATORY_CARE_PROVIDER_SITE_OTHER): Payer: Medicare Other | Admitting: Sports Medicine

## 2015-11-11 ENCOUNTER — Ambulatory Visit
Admission: RE | Admit: 2015-11-11 | Discharge: 2015-11-11 | Disposition: A | Discharge status: Home / Self Care | Payer: Medicare Other | Source: Ambulatory Visit | Attending: Sports Medicine | Admitting: Sports Medicine

## 2015-11-11 VITALS — BP 122/66 | HR 66 | Ht 62.0 in | Wt 250.0 lb

## 2015-11-11 DIAGNOSIS — M545 Low back pain, unspecified: Secondary | ICD-10-CM

## 2015-11-11 DIAGNOSIS — M47816 Spondylosis without myelopathy or radiculopathy, lumbar region: Secondary | ICD-10-CM | POA: Diagnosis not present

## 2015-11-11 MED ORDER — METHYLPREDNISOLONE ACETATE 80 MG/ML IJ SUSP
80.0000 mg | Freq: Once | INTRAMUSCULAR | Status: AC
  Administered 2015-11-11: 80 mg via INTRAMUSCULAR

## 2015-11-11 MED ORDER — KETOROLAC TROMETHAMINE 60 MG/2ML IM SOLN
60.0000 mg | Freq: Once | INTRAMUSCULAR | Status: AC
  Administered 2015-11-11: 60 mg via INTRAMUSCULAR

## 2015-11-11 NOTE — Assessment & Plan Note (Signed)
History of chronic back pain, with an MRI significant for degenerative disc disease and spinal stenosis. Now presenting with 1 month of localized back pain, worse with standing. Likely consider this due to degenerative facet arthropathy, no signs of radiculopathy.  -Get an x-ray to rule out fracture though no point tenderness on spinal processes on exam -MRI to further delineate disease process -Toradol 60 mg IM and Solu-Medrol 80 mg IM for immediate pain relief -Continue Tylenol as needed for pain

## 2015-11-11 NOTE — Patient Instructions (Signed)
We want you to get xray's of your back.  Will work on getting the MRI, and then we will have you follow up. We will give you a shot of Toradol and Solumedrol today for pain relief.

## 2015-11-11 NOTE — Progress Notes (Signed)
   [facility] Kerrin Mo, MD Phone: [phone]  Reason For Visit: 1 month back pain.   # Back pain. Patient has degenerative discs disease with a previous hx of back pain. States for about 1 month has been having lower back pain. States pain is worse with standing for long periods of time. Pain remains local to lower back. No pain running down the leg, no numbness. States that she does have numbness ongoing for about 1 year in her 2nd-5th digits.  Has not tried using heating pad. Using tylenol for pain, helps some. Does have hx of steriod uses for her COPD/Asthma. No inciting event or trauma.   Past Medical History - Hx of back pain in the past, spinal stenosis noted on MRI in 2004  Reviewed problem list.  Medications- reviewed and updated No additions to family history Social history- patient is a non-smoker  Objective: BP 122/66   Pulse 66   Ht 5\' 2"  (1.575 m)   Wt 250 lb (113.4 kg)   BMI 45.73 kg/m  Gen: NAD, alert, cooperative with exam Back: No abnormalities noted on inspection of back, no palpation of step-offs, no spinal tenderness, no paraspinal tenderness, no pain with SI joints. Pain with lateral extension (side to side bending), no pain with flexion or extension. Negative straight leg test. 4 out of 5 strength with plantar flexion on the left side, 5 out of 5 strength otherwise noted in both legs. 2+ reflexes in patella and achilles. No pelvic instability.  Lower extremity: Bilateral with  2+ ankle edema   Assessment/Plan: See problem based a/p  Low back pain History of chronic back pain, with an MRI significant for degenerative disc disease and spinal stenosis. Now presenting with 1 month of localized back pain, worse with standing. Likely consider this due to degenerative facet arthropathy, no signs of radiculopathy.  -Get an x-ray to rule out fracture though no point tenderness on spinal processes on exam -MRI to further delineate disease  process -Toradol 60 mg IM and Solu-Medrol 80 mg IM for immediate pain relief -Continue Tylenol as needed for pain  Patient seen and evaluated with the resident. I agree with the above plan of care. This patient has a history of known spinal stenosis. An MRI scan of her lumbar spine in 2004 confirms this. She is now getting worsening low back pain as well as numbness in her feet. Also weakness in her legs. I would like to pursue an MRI scan of her lumbar spine to evaluate the progression of spinal stenosis. Phone follow-up after I reviewed that study. We may consider epidural steroid injections or possibly facet injections.

## 2015-11-12 ENCOUNTER — Other Ambulatory Visit: Payer: Self-pay | Admitting: Internal Medicine

## 2015-11-12 NOTE — Telephone Encounter (Signed)
Routine refills to Andrea Perry

## 2015-11-12 NOTE — Addendum Note (Signed)
Addended by: Cyd Silence on: 11/12/2015 09:11 AM   Modules accepted: Orders

## 2015-11-15 ENCOUNTER — Telehealth: Payer: Self-pay | Admitting: Internal Medicine

## 2015-11-15 ENCOUNTER — Telehealth: Payer: Self-pay | Admitting: Cardiovascular Disease

## 2015-11-15 ENCOUNTER — Other Ambulatory Visit: Payer: Self-pay | Admitting: Internal Medicine

## 2015-11-15 NOTE — Telephone Encounter (Signed)
Did transfer patient to [facility] health for advice on if she should stop taking the medication.

## 2015-11-15 NOTE — Telephone Encounter (Signed)
Patient states Dr. Jenny Reichmann prescribed him tolterodine for bladder control.  Patient states since she had been taking this medication she has been weak and tired.  Would like to know if this is a side effect of this medication and what to do about it.  Did notify patient that MD will not be back in office until Tuesday.

## 2015-11-15 NOTE — Telephone Encounter (Signed)
New message      Pt c/o BP issue: STAT if pt c/o blurred vision, one-sided weakness or slurred speech  1. What are your last 5 BP readings? Monday 122/66, recheck on Monday evening 103/50 2. Are you having any other symptoms (ex. Dizziness, headache, blurred vision, passed out)? no  3. What is your BP issue? The pt is concerned about the blood pressure being low    The pt wants a appointment today see someone to be checked out cause of the blood pressure and feeling tied

## 2015-11-15 NOTE — Telephone Encounter (Signed)
Called patient about her message. Patient reporting that her BPs have been running low 135/50 and 122/66. Informed patient that her BPs are good. Patient stated she has been feeling strange, weak, tired, nauseated, hot flashes (with no sweating), and SOB ( SOB mostly due to her COPD and Asthma). Patient stated these symptoms started at the beginning of the week. Asked patient if anything has changed in the last week that could be causing these symptoms. Patient stated she had a couple of shots for pain on Monday (Toradol 60 mg IM and Solu-Medrol 80 mg IM), and she started a new medication Detrol 4 mg daily for overactive bladder. Encouraged patient to call Dr. Gwynn Burly office, who started her on the Detrol, and also call the office that gave her the shots and let them know of her symptoms. Will route to Dr. Johnsie Cancel for further advisement if needed.

## 2015-11-17 ENCOUNTER — Emergency Department (HOSPITAL_COMMUNITY): Payer: Medicare Other

## 2015-11-17 ENCOUNTER — Emergency Department (HOSPITAL_COMMUNITY)
Admission: EM | Admit: 2015-11-17 | Discharge: 2015-11-18 | Disposition: A | Discharge status: Home / Self Care | Payer: Medicare Other | Attending: Emergency Medicine | Admitting: Emergency Medicine

## 2015-11-17 ENCOUNTER — Encounter (HOSPITAL_COMMUNITY): Payer: Self-pay

## 2015-11-17 DIAGNOSIS — Y9289 Other specified places as the place of occurrence of the external cause: Secondary | ICD-10-CM | POA: Diagnosis not present

## 2015-11-17 DIAGNOSIS — S46221A Laceration of muscle, fascia and tendon of other parts of biceps, right arm, initial encounter: Secondary | ICD-10-CM | POA: Diagnosis not present

## 2015-11-17 DIAGNOSIS — S59901A Unspecified injury of right elbow, initial encounter: Secondary | ICD-10-CM | POA: Diagnosis not present

## 2015-11-17 DIAGNOSIS — I10 Essential (primary) hypertension: Secondary | ICD-10-CM | POA: Diagnosis not present

## 2015-11-17 DIAGNOSIS — Z7901 Long term (current) use of anticoagulants: Secondary | ICD-10-CM | POA: Diagnosis not present

## 2015-11-17 DIAGNOSIS — Z79899 Other long term (current) drug therapy: Secondary | ICD-10-CM | POA: Insufficient documentation

## 2015-11-17 DIAGNOSIS — J449 Chronic obstructive pulmonary disease, unspecified: Secondary | ICD-10-CM | POA: Diagnosis not present

## 2015-11-17 DIAGNOSIS — E039 Hypothyroidism, unspecified: Secondary | ICD-10-CM | POA: Diagnosis not present

## 2015-11-17 DIAGNOSIS — S46111A Strain of muscle, fascia and tendon of long head of biceps, right arm, initial encounter: Secondary | ICD-10-CM | POA: Diagnosis not present

## 2015-11-17 DIAGNOSIS — Y999 Unspecified external cause status: Secondary | ICD-10-CM | POA: Insufficient documentation

## 2015-11-17 DIAGNOSIS — Z87891 Personal history of nicotine dependence: Secondary | ICD-10-CM | POA: Insufficient documentation

## 2015-11-17 DIAGNOSIS — E119 Type 2 diabetes mellitus without complications: Secondary | ICD-10-CM | POA: Diagnosis not present

## 2015-11-17 DIAGNOSIS — Y93E1 Activity, personal bathing and showering: Secondary | ICD-10-CM | POA: Insufficient documentation

## 2015-11-17 DIAGNOSIS — X58XXXA Exposure to other specified factors, initial encounter: Secondary | ICD-10-CM | POA: Insufficient documentation

## 2015-11-17 DIAGNOSIS — S46211A Strain of muscle, fascia and tendon of other parts of biceps, right arm, initial encounter: Secondary | ICD-10-CM

## 2015-11-17 DIAGNOSIS — S4991XA Unspecified injury of right shoulder and upper arm, initial encounter: Secondary | ICD-10-CM | POA: Diagnosis not present

## 2015-11-17 MED ORDER — METOPROLOL TARTRATE 5 MG/5ML IV SOLN
5.0000 mg | Freq: Once | INTRAVENOUS | Status: AC
  Administered 2015-11-17: 5 mg via INTRAVENOUS
  Filled 2015-11-17: qty 5

## 2015-11-17 NOTE — Progress Notes (Signed)
Orthopedic Tech Progress Note Patient Details:  Andrea Perry Sep 11, 1950 [id number]  Ortho Devices Type of Ortho Device: Arm sling Ortho Device/Splint Location: rue Ortho Device/Splint Interventions: Ordered, Application Applied regular arm sling to [name] per dr verbal order.  Karolee Stamps 11/17/2015, 11:08 PM

## 2015-11-17 NOTE — ED Triage Notes (Signed)
Patient here with right upper arm pain after using it to bath this am, complains of sharp pain to arm with any movement, positive distal pulses

## 2015-11-17 NOTE — ED Notes (Addendum)
Patient transported to XRAY 

## 2015-11-17 NOTE — ED Provider Notes (Signed)
[facility] DEPT Provider Note   CSN: [id number] Arrival date & time: 11/17/15  1807  First Provider Contact:  None       History   Chief Complaint No chief complaint on file. Arm pain  HPI Andrea Perry is a 65 y.o. female.  The history is provided by the patient. No language interpreter was used.   Patient presents with 1 day  Of arm pain. She reports it started when she was bathing. She extended her arm to reach far and had sudden pain along her distal humerus. She immediately noticed a bulge as well. Patient reports she has had pain all day, mainly with movment. At reast pain is minimal. Took NSAIDs at home with no improvement. Currently on Ac for afib. No other complaints. No hx of falls  Past Medical History:  Diagnosis Date  . ALLERGIC RHINITIS 08/16/2008  . ANXIETY 08/16/2008  . Arthritis    hands, knees, lower back  . ASTHMA 08/16/2008  . Chronic lower back pain    problems with disc L2-5  . COPD 08/16/2008  . DEPRESSION 08/16/2008  . Dysrhythmia    hx AF  . Excessive daytime sleepiness 06/19/2015  . GENITAL HERPES 12/03/2006  . GERD (gastroesophageal reflux disease)   . H/O hiatal hernia   . HEMATOCHEZIA 06/20/2009  . Hemorrhoids   . History of cardioversion 10/30/14  . HYPERLIPIDEMIA 08/16/2008  . HYPERTENSION 12/03/2006  . HYPOTHYROIDISM 12/03/2006   History only - no current problem  . IBS 06/20/2009   hx per patient, no current problem  . Impaired glucose tolerance 09/21/2010  . Overactive bladder 10/20/2015  . Pulmonary HTN ([place]) 06/19/2015  . Shortness of breath dyspnea   . Snoring 06/19/2015    Patient Active Problem List   Diagnosis Date Noted  . Overactive bladder 10/20/2015  . Pulmonary HTN ([place]) 06/19/2015  . Excessive daytime sleepiness 06/19/2015  . Snoring 06/19/2015  . Abdominal pain 04/18/2015  . Chronic cough 03/08/2015  . A-fib ([place]) 02/05/2014  . Preop cardiovascular exam 02/05/2014  . Arthritis of knee 02/05/2014  . Arm pain, right 08/31/2013   . Low back pain 09/26/2011  . Leg pain 07/07/2011  . Dizziness 07/07/2011  . Menopausal disorder 07/07/2011  . Hemorrhage of rectum and anus 11/04/2010  . Internal thrombosed hemorrhoids 11/04/2010  . Internal hemorrhoids with other complication 123456  . External hemorrhoids without mention of complication 123456  . Dyspepsia 09/24/2010  . Peripheral edema 09/24/2010  . Diabetes ([place]) 09/21/2010  . Encounter for preventative adult health care exam with abnormal findings 09/21/2010  . IBS 06/20/2009  . HEMATOCHEZIA 06/20/2009  . Elevated lipids 08/16/2008  . ANXIETY 08/16/2008  . Depression 08/16/2008  . Allergic rhinitis 08/16/2008  . ASTHMA 08/16/2008  . COPD (chronic obstructive pulmonary disease) ([place]) 08/16/2008  . GENITAL HERPES 12/03/2006  . Hypothyroidism 12/03/2006  . Essential hypertension 12/03/2006    Past Surgical History:  Procedure Laterality Date  . ABDOMINAL HYSTERECTOMY    . APPENDECTOMY    . CARDIOVERSION N/A 03/20/2014   Procedure: CARDIOVERSION;  Surgeon: Josue Hector, MD;  Location: [facility] ENDOSCOPY;  Service: Cardiovascular;  Laterality: N/A;  . CARDIOVERSION N/A 10/30/2014   Procedure: CARDIOVERSION;  Surgeon: Josue Hector, MD;  Location: [facility] ENDOSCOPY;  Service: Cardiovascular;  Laterality: N/A;  . CARPAL TUNNEL RELEASE     Right   . CESAREAN SECTION     x 1   . COLONOSCOPY  2004  . CYSTOCELE REPAIR N/A 10/25/2012   Procedure:  ANTERIOR REPAIR (CYSTOCELE);  Surgeon: Gus Height, MD;  Location: [facility];  Service: Gynecology;  Laterality: N/A;  . KNEE SURGERY     Right   . RADIOLOGY WITH ANESTHESIA Right 11/01/2014   Procedure: MRI RIGHT FOREARM;  Surgeon: Medication Radiologist, MD;  Location: [facility];  Service: Radiology;  Laterality: Right;  . RADIOLOGY WITH ANESTHESIA Right 08/29/2015   Procedure: MRI - RIGHT FOREARM;  Surgeon: Medication Radiologist, MD;  Location: [place] [facility];  Service: Radiology;  Laterality: Right;  . SPLIT NIGHT STUDY  09/04/2015    . TONSILLECTOMY AND ADENOIDECTOMY      OB History    No data available       Home Medications    Prior to Admission medications   Medication Sig Start Date End Date Taking? Authorizing Provider  acetaminophen (TYLENOL) 650 MG CR tablet Take 1,300 mg by mouth 2 (two) times daily.    Historical Provider, MD  albuterol (VENTOLIN HFA) 108 (90 Base) MCG/ACT inhaler Inhale 2 puffs into the lungs every 6 (six) hours as needed for wheezing or shortness of breath. Yearly physical due in June must see MD for refills 09/09/15   Biagio Borg, MD  amLODipine (NORVASC) 10 MG tablet Take 1 tablet (10 mg total) by mouth daily. 10/07/15   Josue Hector, MD  b complex vitamins tablet Take 1 tablet by mouth daily.    Historical Provider, MD  BLACK COHOSH EXTRACT PO Take 40 mg by mouth daily.    Historical Provider, MD  cetirizine (ZYRTEC) 10 MG tablet Take 10 mg by mouth daily.     Historical Provider, MD  cholecalciferol (VITAMIN D) 1000 UNITS tablet Take 1,000 Units by mouth daily.    Historical Provider, MD  ELIQUIS 5 MG TABS tablet TAKE ONE TABLET BY MOUTH TWICE DAILY. 11/08/14   Josue Hector, MD  escitalopram (LEXAPRO) 20 MG tablet Take 20 mg by mouth at bedtime.    Historical Provider, MD  estradiol (ESTRACE) 2 MG tablet TAKE ONE TABLET BY MOUTH ONCE DAILY 09/06/15   Biagio Borg, MD  flecainide (TAMBOCOR) 50 MG tablet Take 1 tablet (50 mg total) by mouth 2 (two) times daily. 09/17/15   Josue Hector, MD  fluorouracil (EFUDEX) 5 % cream  11/06/15   Historical Provider, MD  Fluticasone-Salmeterol (ADVAIR DISKUS) 500-50 MCG/DOSE AEPB INHALE ONE PUFF INTO THE LUNGS TWICE DAILY 09/20/15   Biagio Borg, MD  furosemide (LASIX) 40 MG tablet TAKE ONE TABLET BY MOUTH ONCE DAILY 11/12/15   Biagio Borg, MD  Garlic 123XX123 MG CAPS Take 1 capsule by mouth at bedtime.     Historical Provider, MD  GuaiFENesin (MUCUS RELIEF ADULT PO) Take 1 tablet by mouth 2 (two) times daily.     Historical Provider, MD  irbesartan  (AVAPRO) 300 MG tablet Take 300 mg by mouth daily.    Historical Provider, MD  LORazepam (ATIVAN) 1 MG tablet TAKE ONE TABLET BY MOUTH AT BEDTIME AS NEEDED FOR ANXIETY 07/16/15   Biagio Borg, MD  metoprolol (LOPRESSOR) 50 MG tablet Take 1 and 1/2 tabs by mouth twice per day 10/07/15   Josue Hector, MD  Omega-3 Fatty Acids (FISH OIL) 1200 MG CAPS Take 1 capsule by mouth daily.     Historical Provider, MD  pantoprazole (PROTONIX) 40 MG tablet TAKE ONE TABLET BY MOUTH ONCE DAILY 07/25/14   Biagio Borg, MD  potassium chloride SA (K-DUR,KLOR-CON) 20 MEQ tablet Take 1 tablet (20 mEq total)  by mouth daily. 10/29/14   Josue Hector, MD  pravastatin (PRAVACHOL) 40 MG tablet TAKE ONE TABLET BY MOUTH ONCE DAILY 09/06/15   Biagio Borg, MD  promethazine-codeine [facility] WITH CODEINE) 6.25-10 MG/5ML syrup Take 5 mLs by mouth every 4 (four) hours as needed for cough. 07/23/15   Biagio Borg, MD  solifenacin (VESICARE) 5 MG tablet Take 1 tablet (5 mg total) by mouth daily. 10/16/15   Biagio Borg, MD  Spacer/Aero-Holding Chambers (AEROCHAMBER MV) inhaler Use as instructed 10/10/15   Collene Gobble, MD  tolterodine (DETROL LA) 4 MG 24 hr capsule Take 1 capsule (4 mg total) by mouth daily. 11/06/15   Biagio Borg, MD    Family History Family History  Problem Relation Age of Onset  . Diabetes Mother   . Hyperlipidemia Mother   . Hypertension Mother   . Colon polyps Mother   . Stroke Father   . Colon cancer Neg Hx     Social History Social History  Substance Use Topics  . Smoking status: Former Smoker    Packs/day: 1.50    Years: 30.00    Types: Cigarettes    Quit date: 04/21/2003  . Smokeless tobacco: Never Used  . Alcohol use No     Allergies   Codeine; Tramadol hcl; and Vicodin [hydrocodone-acetaminophen]   Review of Systems Review of Systems  Constitutional: Negative for chills and fever.  HENT: Negative for trouble swallowing.   Eyes: Negative for visual disturbance.  Respiratory: Negative  for cough and shortness of breath.   Cardiovascular: Negative for chest pain and palpitations.  Gastrointestinal: Negative for abdominal pain, nausea and vomiting.  Genitourinary: Negative for hematuria.  Musculoskeletal: Negative for joint swelling.  Skin: Negative for pallor.  Neurological: Negative for tremors, syncope and light-headedness.  Hematological: Bruises/bleeds easily.  Psychiatric/Behavioral: Negative for agitation and confusion.  All other systems reviewed and are negative.    Physical Exam Updated Vital Signs There were no vitals taken for this visit.  Physical Exam  Constitutional: She appears well-developed and well-nourished. No distress.  Has episodes of hot flashes. Presented sweating but resolved without intervention  HENT:  Head: Normocephalic and atraumatic.  Eyes: Conjunctivae are normal. Right eye exhibits no discharge. Left eye exhibits no discharge.  Neck: Normal range of motion. Neck supple.  Cardiovascular: Normal rate and intact distal pulses.   Irregularly irregular   Pulmonary/Chest: Effort normal and breath sounds normal. No respiratory distress.  Abdominal: Soft. She exhibits no distension. There is no tenderness. There is no rebound and no guarding.  Musculoskeletal: She exhibits deformity. She exhibits no edema.  Bulge along R biceps that does not change with flexion extension. No significant TTP along RUE and no crepitance noted.  Neurological: She is alert.  Skin: Skin is warm. Capillary refill takes less than 2 seconds. She is not diaphoretic.  Psychiatric: She has a normal mood and affect. Her behavior is normal. Thought content normal.  Nursing note and vitals reviewed.    ED Treatments / Results  Labs (all labs ordered are listed, but only abnormal results are displayed) Labs Reviewed - No data to display  EKG  EKG Interpretation None       Radiology Dg Shoulder Right  Result Date: 11/17/2015 CLINICAL DATA:  Right humerus  injury today while bathing. Initial encounter. EXAM: RIGHT SHOULDER - 2+ VIEW COMPARISON:  None. FINDINGS: Negative for acute fracture or dislocation. Insertional spurs on the greater tuberosity and acromion. Mild degenerative AC joint  spurring. IMPRESSION: No acute finding. Electronically Signed   By: Monte Fantasia M.D.   On: 11/17/2015 20:36  Dg Elbow 2 Views Right  Result Date: 11/17/2015 CLINICAL DATA:  Right humerus injury today while bathing. Initial encounter. EXAM: RIGHT ELBOW - 2 VIEW COMPARISON:  Forearm MRI 08/29/2015 FINDINGS: There is no evidence of fracture, dislocation, or joint effusion. No acute soft tissue finding. IMPRESSION: Negative. Electronically Signed   By: Monte Fantasia M.D.   On: 11/17/2015 20:36  Dg Humerus Right  Result Date: 11/17/2015 CLINICAL DATA:  Right humerus injury today while bathing. Initial encounter. EXAM: RIGHT HUMERUS - 2+ VIEW COMPARISON:  None. FINDINGS: There is no evidence of fracture or other focal bone lesions. Soft tissues are negative. IMPRESSION: Negative. Electronically Signed   By: Monte Fantasia M.D.   On: 11/17/2015 20:34   Procedures Procedures (including critical care time)  Medications Ordered in ED Medications - No data to display   Initial Impression / Assessment and Plan / ED Course  I have reviewed the triage vital signs and the nursing notes.  Pertinent labs & imaging results that were available during my care of the patient were reviewed by me and considered in my medical decision making (see chart for details).  Clinical Course   Patient present with sudden onset R proximal arm pain with deformity. NVI. Unlikely to be fracture. Xray obtained and did not reveal any fractures. Patient still has intact strength for flexion, only limited by pain when movement is resisted. Bedside ultrasound was performed and no fluid collection was noted.   Patient was found to have mildly elevated HR in afib while waiting. Was given 1x 5mg   metop IV with mild improvement. Patient was Asx throughout so was encourage to monitor her HR at home and follow up this upcoming week with her cardiologist for possible uptitration of her beta blocker. Patient was told to return to ED if any new symptoms appeared. Usual and customary return precautions also discussed. Patient agrees with plan. All quesions answered. Otherwise stable asides from difficult to control Asx afib.    Final Clinical Impressions(s) / ED Diagnoses   Final diagnoses:  Biceps muscle tear, right, initial encounter    New Prescriptions New Prescriptions   No medications on file     Darlina Rumpf, MD 11/19/15 [place], DO 11/19/15 1513

## 2015-11-18 ENCOUNTER — Telehealth: Payer: Self-pay

## 2015-11-18 MED ORDER — ESCITALOPRAM OXALATE 20 MG PO TABS
20.0000 mg | ORAL_TABLET | Freq: Every day | ORAL | 3 refills | Status: DC
Start: 2015-11-18 — End: 2016-09-08

## 2015-11-18 NOTE — Telephone Encounter (Signed)
Done erx 

## 2015-11-18 NOTE — Addendum Note (Signed)
Addended by: Biagio Borg on: 11/18/2015 09:00 PM   Modules accepted: Orders

## 2015-11-18 NOTE — Telephone Encounter (Signed)
Please advise patient is requesting refill on lexapro 

## 2015-11-19 NOTE — Telephone Encounter (Signed)
Symptoms from [place] no heart meds/condition

## 2015-11-20 NOTE — Telephone Encounter (Signed)
Called patient back to let her know Dr. Kyla Balzarine response.

## 2015-11-26 ENCOUNTER — Other Ambulatory Visit: Payer: Self-pay | Admitting: Internal Medicine

## 2015-12-02 ENCOUNTER — Telehealth: Payer: Self-pay | Admitting: *Deleted

## 2015-12-02 ENCOUNTER — Encounter (HOSPITAL_COMMUNITY): Payer: Self-pay

## 2015-12-02 ENCOUNTER — Encounter (HOSPITAL_COMMUNITY)
Admission: RE | Admit: 2015-12-02 | Discharge: 2015-12-02 | Disposition: A | Discharge status: Home / Self Care | Payer: Medicare Other | Source: Ambulatory Visit | Attending: Sports Medicine | Admitting: Sports Medicine

## 2015-12-02 DIAGNOSIS — Z87891 Personal history of nicotine dependence: Secondary | ICD-10-CM | POA: Diagnosis not present

## 2015-12-02 DIAGNOSIS — M545 Low back pain: Secondary | ICD-10-CM | POA: Diagnosis present

## 2015-12-02 DIAGNOSIS — F418 Other specified anxiety disorders: Secondary | ICD-10-CM | POA: Diagnosis not present

## 2015-12-02 DIAGNOSIS — Z79890 Hormone replacement therapy (postmenopausal): Secondary | ICD-10-CM | POA: Diagnosis not present

## 2015-12-02 DIAGNOSIS — M5126 Other intervertebral disc displacement, lumbar region: Secondary | ICD-10-CM | POA: Diagnosis not present

## 2015-12-02 DIAGNOSIS — Z79899 Other long term (current) drug therapy: Secondary | ICD-10-CM | POA: Diagnosis not present

## 2015-12-02 DIAGNOSIS — Z6841 Body Mass Index (BMI) 40.0 and over, adult: Secondary | ICD-10-CM | POA: Diagnosis not present

## 2015-12-02 DIAGNOSIS — J449 Chronic obstructive pulmonary disease, unspecified: Secondary | ICD-10-CM | POA: Diagnosis not present

## 2015-12-02 DIAGNOSIS — I10 Essential (primary) hypertension: Secondary | ICD-10-CM | POA: Diagnosis not present

## 2015-12-02 DIAGNOSIS — E119 Type 2 diabetes mellitus without complications: Secondary | ICD-10-CM | POA: Diagnosis not present

## 2015-12-02 DIAGNOSIS — K219 Gastro-esophageal reflux disease without esophagitis: Secondary | ICD-10-CM | POA: Diagnosis not present

## 2015-12-02 DIAGNOSIS — Z7951 Long term (current) use of inhaled steroids: Secondary | ICD-10-CM | POA: Diagnosis not present

## 2015-12-02 DIAGNOSIS — M4806 Spinal stenosis, lumbar region: Secondary | ICD-10-CM | POA: Diagnosis not present

## 2015-12-02 HISTORY — DX: Essential (primary) hypertension: I10

## 2015-12-02 LAB — CBC
HEMATOCRIT: 40.2 % (ref 36.0–46.0)
HEMOGLOBIN: 13 g/dL (ref 12.0–15.0)
MCH: 31.5 pg (ref 26.0–34.0)
MCHC: 32.3 g/dL (ref 30.0–36.0)
MCV: 97.3 fL (ref 78.0–100.0)
Platelets: 240 10*3/uL (ref 150–400)
RBC: 4.13 MIL/uL (ref 3.87–5.11)
RDW: 12.7 % (ref 11.5–15.5)
WBC: 8.1 10*3/uL (ref 4.0–10.5)

## 2015-12-02 LAB — BASIC METABOLIC PANEL
ANION GAP: 14 (ref 5–15)
BUN: 15 mg/dL (ref 6–20)
CALCIUM: 8.1 mg/dL — AB (ref 8.9–10.3)
CO2: 29 mmol/L (ref 22–32)
Chloride: 99 mmol/L — ABNORMAL LOW (ref 101–111)
Creatinine, Ser: 0.74 mg/dL (ref 0.44–1.00)
GLUCOSE: 150 mg/dL — AB (ref 65–99)
POTASSIUM: 3.4 mmol/L — AB (ref 3.5–5.1)
Sodium: 142 mmol/L (ref 135–145)

## 2015-12-02 NOTE — Telephone Encounter (Signed)
irbesartan (AVAPRO) 150 MG tablet  Medication  Date: 11/26/2015 Department: [facility] Ordering/Authorizing: Biagio Borg, MD  Order Providers   Prescribing Provider Encounter Provider  Biagio Borg, MD Biagio Borg, MD  Medication Detail    Disp Refills Start End   irbesartan (AVAPRO) 150 MG tablet 90 tablet 3 11/26/2015    Sig: TAKE ONE TABLET BY MOUTH ONCE DAILY   E-Prescribing Status: Receipt confirmed by pharmacy (11/26/2015 2:45 PM EDT)   Pharmacy   [facility] [place], [place] - [address]   informed pt that her Jonni Sanger was filled by PCP & sent to [facility], pt expressed understanding.

## 2015-12-02 NOTE — Pre-Procedure Instructions (Addendum)
    [place]  12/02/2015      [place], [place] - [address] [place] [address] Phone: [phone] Fax: [phone]    Your procedure is scheduled on   12-05-2015   Thursday              Please arrive at 6:00 AM  [facility] and go to Admitting .                Call this number if you have problems the morning of surgery:  [phone]   Remember:  Do not eat food or drink liquids after midnight.   Take these medicines the morning of surgery with A SIP OF WATER Tylenol if needed,Albuterol inhaler if needed(Bring with you),Amlodipine(Norvasc),Symbicort inhaler,cetirizine(Zyrtec),Eliquis,Lexapro,estradiol(Estrace),Guaifenesin,Metoprolol(Lopressor),pantoprazole(Protonix),Potassium,Phenergan with codeine if needed   Do not wear jewelry, make-up or nail polish.  Do not wear lotions, powders, or perfumes.  You may NOT wear deoderant.  Do not shave 48 hours prior to surgery.     Do not bring valuables to the hospital.  [facility] is not responsible for any belongings or valuables     Patients discharged the day of surgery will not be allowed to drive home.

## 2015-12-03 ENCOUNTER — Other Ambulatory Visit: Payer: Self-pay | Admitting: Cardiovascular Disease

## 2015-12-04 NOTE — Anesthesia Preprocedure Evaluation (Signed)
Anesthesia Evaluation  Patient identified by MRN, date of birth, ID band Patient awake    Reviewed: Allergy & Precautions, NPO status , Patient's Chart, lab work & pertinent test results  Airway Mallampati: II   Neck ROM: full    Dental   Pulmonary asthma , COPD, former smoker,    breath sounds clear to auscultation       Cardiovascular hypertension, + dysrhythmias Atrial Fibrillation  Rhythm:irregular Rate:Normal     Neuro/Psych PSYCHIATRIC DISORDERS Anxiety Depression    GI/Hepatic hiatal hernia, GERD  ,  Endo/Other  diabetes, Type 2Hypothyroidism Morbid obesity  Renal/GU      Musculoskeletal  (+) Arthritis ,   Abdominal (+) + obese,   Peds  Hematology   Anesthesia Other Findings   Reproductive/Obstetrics                             Anesthesia Physical  Anesthesia Plan  ASA: III  Anesthesia Plan: General   Post-op Pain Management:    Induction: Intravenous  Airway Management Planned: Oral ETT  Additional Equipment:   Intra-op Plan:   Post-operative Plan:   Informed Consent: I have reviewed the patients History and Physical, chart, labs and discussed the procedure including the risks, benefits and alternatives for the proposed anesthesia with the patient or authorized representative who has indicated his/her understanding and acceptance.     Plan Discussed with: CRNA, Anesthesiologist and Surgeon  Anesthesia Plan Comments:         Anesthesia Quick Evaluation

## 2015-12-05 ENCOUNTER — Ambulatory Visit (HOSPITAL_COMMUNITY)
Admission: RE | Admit: 2015-12-05 | Discharge: 2015-12-05 | Disposition: A | Discharge status: Home / Self Care | Payer: Medicare Other | Source: Ambulatory Visit | Attending: Sports Medicine | Admitting: Sports Medicine

## 2015-12-05 ENCOUNTER — Ambulatory Visit (HOSPITAL_COMMUNITY): Payer: Medicare Other | Admitting: Certified Registered Nurse Anesthetist

## 2015-12-05 ENCOUNTER — Encounter (HOSPITAL_COMMUNITY)
Admission: RE | Disposition: A | Discharge status: Home / Self Care | Payer: Self-pay | Source: Ambulatory Visit | Attending: Sports Medicine

## 2015-12-05 ENCOUNTER — Encounter (HOSPITAL_COMMUNITY): Payer: Self-pay | Admitting: *Deleted

## 2015-12-05 DIAGNOSIS — F418 Other specified anxiety disorders: Secondary | ICD-10-CM | POA: Diagnosis not present

## 2015-12-05 DIAGNOSIS — E119 Type 2 diabetes mellitus without complications: Secondary | ICD-10-CM | POA: Insufficient documentation

## 2015-12-05 DIAGNOSIS — I10 Essential (primary) hypertension: Secondary | ICD-10-CM | POA: Diagnosis not present

## 2015-12-05 DIAGNOSIS — M5126 Other intervertebral disc displacement, lumbar region: Secondary | ICD-10-CM | POA: Diagnosis not present

## 2015-12-05 DIAGNOSIS — J449 Chronic obstructive pulmonary disease, unspecified: Secondary | ICD-10-CM | POA: Insufficient documentation

## 2015-12-05 DIAGNOSIS — Z6841 Body Mass Index (BMI) 40.0 and over, adult: Secondary | ICD-10-CM | POA: Insufficient documentation

## 2015-12-05 DIAGNOSIS — M4806 Spinal stenosis, lumbar region: Secondary | ICD-10-CM | POA: Diagnosis not present

## 2015-12-05 DIAGNOSIS — K219 Gastro-esophageal reflux disease without esophagitis: Secondary | ICD-10-CM | POA: Insufficient documentation

## 2015-12-05 DIAGNOSIS — M545 Low back pain, unspecified: Secondary | ICD-10-CM

## 2015-12-05 DIAGNOSIS — Z7951 Long term (current) use of inhaled steroids: Secondary | ICD-10-CM | POA: Insufficient documentation

## 2015-12-05 DIAGNOSIS — Z79899 Other long term (current) drug therapy: Secondary | ICD-10-CM | POA: Insufficient documentation

## 2015-12-05 DIAGNOSIS — Z87891 Personal history of nicotine dependence: Secondary | ICD-10-CM | POA: Insufficient documentation

## 2015-12-05 DIAGNOSIS — Z79890 Hormone replacement therapy (postmenopausal): Secondary | ICD-10-CM | POA: Insufficient documentation

## 2015-12-05 HISTORY — PX: RADIOLOGY WITH ANESTHESIA: SHX6223

## 2015-12-05 LAB — GLUCOSE, CAPILLARY: Glucose-Capillary: 141 mg/dL — ABNORMAL HIGH (ref 65–99)

## 2015-12-05 SURGERY — RADIOLOGY WITH ANESTHESIA
Anesthesia: General

## 2015-12-05 MED ORDER — LACTATED RINGERS IV SOLN
INTRAVENOUS | Status: DC | PRN
  Administered 2015-12-05: 08:00:00 via INTRAVENOUS

## 2015-12-05 NOTE — Anesthesia Procedure Notes (Signed)
Procedure Name: Intubation Date/Time: 12/05/2015 8:17 AM Performed by: [place] Pink Pre-anesthesia Checklist: Patient identified, Emergency Drugs available, Suction available, Patient being monitored and Timeout performed Patient Re-evaluated:Patient Re-evaluated prior to inductionOxygen Delivery Method: Circle system utilized Preoxygenation: Pre-oxygenation with 100% oxygen Intubation Type: IV induction and Rapid sequence Laryngoscope Size: Mac and 4 Grade View: Grade III Tube type: Oral Tube size: 7.0 mm Number of attempts: 1 Airway Equipment and Method: Stylet Placement Confirmation: ETT inserted through vocal cords under direct vision,  positive ETCO2 and breath sounds checked- equal and bilateral Secured at: 21 cm Tube secured with: Tape Dental Injury: Teeth and Oropharynx as per pre-operative assessment

## 2015-12-05 NOTE — Anesthesia Postprocedure Evaluation (Signed)
Anesthesia Post Note  Patient: Andrea Perry  Procedure(s) Performed: Procedure(s) (LRB): MRI SPINE WITHOUT (N/A)  Patient location during evaluation: PACU Anesthesia Type: General Level of consciousness: sedated and patient cooperative Pain management: pain level controlled Vital Signs Assessment: post-procedure vital signs reviewed and stable Respiratory status: spontaneous breathing Cardiovascular status: stable Anesthetic complications: no    Last Vitals:  Vitals:   12/05/15 1035 12/05/15 1041  BP: 110/60 125/65  Pulse: 87 83  Resp: 20 20  Temp: 37 C     Last Pain:  Vitals:   12/05/15 1041  TempSrc:   PainSc: 0-No pain                 Nolon Nations

## 2015-12-05 NOTE — Transfer of Care (Signed)
Immediate Anesthesia Transfer of Care Note  Patient: Andrea Perry  Procedure(s) Performed: Procedure(s): MRI SPINE WITHOUT (N/A)  Patient Location: PACU  Anesthesia Type:General  Level of Consciousness: awake, alert  and oriented  Airway & Oxygen Therapy: Patient Spontanous Breathing  Post-op Assessment: Report given to RN  Post vital signs: Reviewed and stable  Last Vitals:  Vitals:   12/05/15 0618  BP: 116/66  Pulse: 81  Resp: 18  Temp: 36.7 C    Last Pain:  Vitals:   12/05/15 0618  TempSrc: Oral      Patients Stated Pain Goal: 1 ([id number])  Complications: No apparent anesthesia complications

## 2015-12-05 NOTE — H&P (Signed)
  History/ Physical required for conscious sedation on 12/05/2015     Chief complaint: Low back pain  Patient presents on 12/05/2015 for conscious sedation for an MRI of her lumbar spine. She has a well-documented history of spinal stenosis and also a history of claustrophobia. For the past couple months her low back pain has been worsening. Pain is worse with standing for long periods of time. She is also complaining of worsening numbness in her feet. Also weakness in her legs. No change in bowel or bladder.  Past medical history: Significant for COPD, asthma, osteoarthritis, spinal stenosis  Medications: Medications are reviewed and listed on the chart. They include but are not limited to Zyrtec, Lexapro, Estrace, Lasix, Avapro, Ativan, and AeroChamber  Allergies: Tramadol, codeine, Vicodin,  Social history: Former smoker, denies alcohol use, denies illicit drug use  Family history: Mother is deceased. She had diabetes and hypertension. Father is also deceased. He had a history of a stroke  Physical exam:  Physical exam by myself on 11/11/2015 noted the patient to be awake alert and ,no acute distress. Unlabored breathing. Lumbar spine showed pain primarily with lateral extension. Negative straight leg raise bilaterally. She did have 4/5 strength with resisted plantarflexion on the left compared to 5/5 on the right. Reflexes were equal at the Achilles and patellar tendons. Good pulses distally.   Impression/plan:  Worsening spinal stenosis  Patient is scheduled to undergo an MRI with conscious sedation on 12/05/2015. She has had previous MRIs with conscious sedation in the past. We'll follow-up with her via telephone with those results once available. We did discuss the possibility of epidural steroid injections or possibly facet injections based on her findings. If stenosis is severe, also consider neurosurgical referral.

## 2015-12-06 ENCOUNTER — Encounter (HOSPITAL_COMMUNITY): Payer: Self-pay | Admitting: Radiology

## 2015-12-10 ENCOUNTER — Telehealth: Payer: Self-pay | Admitting: Sports Medicine

## 2015-12-10 ENCOUNTER — Ambulatory Visit (INDEPENDENT_AMBULATORY_CARE_PROVIDER_SITE_OTHER): Payer: Medicare Other | Admitting: Emergency Medicine

## 2015-12-10 ENCOUNTER — Encounter: Payer: Self-pay | Admitting: Emergency Medicine

## 2015-12-10 DIAGNOSIS — J438 Other emphysema: Secondary | ICD-10-CM | POA: Diagnosis not present

## 2015-12-10 DIAGNOSIS — G4733 Obstructive sleep apnea (adult) (pediatric): Secondary | ICD-10-CM

## 2015-12-10 DIAGNOSIS — J309 Allergic rhinitis, unspecified: Secondary | ICD-10-CM

## 2015-12-10 NOTE — Patient Instructions (Addendum)
Please continue your Symbicort twice a day through a spacer. Gargle and rinse after using.  Take albuterol 2 puffs up to every 4 hours if needed for shortness of breath.  Continue zyrtec.  Get the flu shot this Fall.  Your pneumonia shots are up to date.  Follow with Dr Lamonte Sakai in 6 months or sooner if you have any problems

## 2015-12-10 NOTE — Telephone Encounter (Signed)
I spoke with the patient on the phone today after reviewing the MRI of her lumbar spine. Dominant finding is moderate to severe spinal stenosis at L4-L5. She also has severe bilateral facet arthropathy at this level. I discussed neurosurgical referral but the patient would like to first start with a simple lumbar support. I also discussed the possibility of epidural injections or facet injections but I do not think that they will provide her with much benefit. Patient will notify me when she is ready for neurosurgical referral. Otherwise, follow-up as needed.

## 2015-12-10 NOTE — Assessment & Plan Note (Signed)
Continue zyrtec.  

## 2015-12-10 NOTE — Progress Notes (Signed)
Subjective:    Patient ID: Andrea Perry, female    DOB: 1950/08/23, 65 y.o.   MRN: [id number]  HPI 65 yo woman, hx of childhood asthma, hx tobacco (45 pk-yrs), allergies, GERD, carries a dx of COPD and adult asthma made by Dr Jenny Reichmann. She has worked in a [employer] and had to have spirometry as a part of her job. She is referred today for her COPD and also for pre-op eval for L knee surgery w Dr Noemi Chapel. She describes some exertional dyspnea, happens after 49ft. She has some occasional cough, non-productive. She sometimes hears wheeze. She has had episodes of light-headedness when walking an extended distance.   ROV 02/14/14 -- follow-up visit for COPD and history of childhood asthma. She was also recently diagnosed with atrial fibrillation - on eliquis and metoprolol. Last visit we added Spiriva to her Advair to see if she will benefit. She believed that she benefited from the [place], but note that she was having work done on her A fib and rate control at the same time.   ROV 63/10/16 -- Follow-up visit for COPD and a history of asthma. She described variably in her breathing, good days and bad days. She has gained about 10 lbs. She has exertional SOB, especially w housework but not every time. She is on Advair bid, ventolin prn > averages 3x a week. She doesn't seem to miss spiriva. She has occasional coughing. She remains on zyrtec and omeprazole   ROV 03/08/15 -- follow-up visit for asthmatic COPD in the setting of prior tobacco use, cotton dust exposure and also childhood asthma. She is currently managed on Advair 500/50 twice a day, albuterol when necessary which she uses about 3-4x a weeks. She's also been seen by Dr. Johnsie Cancel for hypertension and atrial fibrillation, cardioverted in July. She has stayed in NSR on flecainide. She still has some exertional dyspnea on some days, not all. Notices with chores. She has a new cough that started  A few months ago - note avapro added at that time. She is  sneezing some, on zyrtec.   ROV 10/10/15 -- patient with a history of COPD with asthmatic features, history of tobacco use and cotton dust exposure. She had acute exacerbation with bronchitis in January 2017 which was treated in our office. 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 She continues to have exertional dyspnea, has to stop to rest w walking sometimes.  She is having some throat irritation, cough has been a bit worse since increasing her avapro. She has some nasal congestion, throat clearing. No significant wheeze, chest tightness. She is tolerating Advair w some UA irritation.   ROV 12/10/15 -- Follow-up visit for patient with a history of COPD and asthmatic features, cotton dust exposure. She has some degree of upper airway irritation syndrome as well. We tried changing her Advair to Symbicort to see if this would help with her upper airway irritation. She is also on Zyrtec. She feels that it is helped her breathing more than the Advair. Using a spacer. Throat is less irritated. The co-pay is high right now.    Review of Systems  Constitutional: Negative for fever and unexpected weight change.  HENT: Negative for congestion, dental problem, ear pain, nosebleeds, postnasal drip, rhinorrhea, sinus pressure, sneezing, sore throat and trouble swallowing.   Eyes: Negative for redness and itching.  Respiratory: Positive for cough and shortness  of breath. Negative for chest tightness and wheezing.   Cardiovascular: Negative for palpitations and leg swelling.  Gastrointestinal: Negative for nausea and vomiting.  Genitourinary: Negative for dysuria.  Musculoskeletal: Negative for joint swelling.  Skin: Negative for rash.  Neurological: Negative for headaches.  Hematological: Does not bruise/bleed easily.  Psychiatric/Behavioral: Negative for dysphoric mood. The patient is not nervous/anxious.          Objective:   Physical Exam Vitals:   12/10/15 1139 12/10/15 1140  BP:  138/90  BP Location:  Left Arm  Cuff Size:  Normal  Pulse:  70  SpO2:  95%  Weight: 263 lb (119.3 kg)   Height: 5\' 2"  (1.575 m)    Gen: Pleasant, well-nourished, in no distress,  normal affect  ENT: No lesions,  mouth clear,  oropharynx clear, no postnasal drip  Neck: No JVD, no TMG, no carotid bruits  Lungs: No use of accessory muscles, no dullness to percussion, clear without rales or rhonchi  Cardiovascular: RRR, heart sounds normal, no murmur or gallops, no peripheral edema  Musculoskeletal: No deformities, no cyanosis or clubbing  Neuro: alert, non focal  Skin: Warm, no lesions or rashes      Assessment & Plan:  COPD (chronic obstructive pulmonary disease) Please continue your Symbicort twice a day through a spacer. Gargle and rinse after using.  Take albuterol 2 puffs up to every 4 hours if needed for shortness of breath.  Continue zyrtec.  Get the flu shot this Fall.  Your pneumonia shots are up to date.  Follow with Dr Lamonte Sakai in 6 months or sooner if you have any problems  Allergic rhinitis Continue zyrtec  Mild obstructive sleep apnea Based on her sleep study and her AHI of 4.9 per hour she does not currently meet criteria for CPAP. We may need to reconsider this in the future. Also given her periodic limb movements may be relevant to Have a lower threshold to treat her sleep apnea  Baltazar Apo, MD, PhD 12/10/2015, 12:01 PM [facility] [phone] or if no answer [phone]

## 2015-12-10 NOTE — Assessment & Plan Note (Signed)
Based on her sleep study and her AHI of 4.9 per hour she does not currently meet criteria for CPAP. We may need to reconsider this in the future. Also given her periodic limb movements may be relevant to Have a lower threshold to treat her sleep apnea

## 2015-12-10 NOTE — Assessment & Plan Note (Signed)
Please continue your Symbicort twice a day through a spacer. Gargle and rinse after using.  Take albuterol 2 puffs up to every 4 hours if needed for shortness of breath.  Continue zyrtec.  Get the flu shot this Fall.  Your pneumonia shots are up to date.  Follow with Dr Lamonte Sakai in 6 months or sooner if you have any problems

## 2015-12-12 ENCOUNTER — Encounter: Payer: Self-pay | Admitting: Sports Medicine

## 2015-12-12 ENCOUNTER — Ambulatory Visit (INDEPENDENT_AMBULATORY_CARE_PROVIDER_SITE_OTHER): Payer: Medicare Other | Admitting: Sports Medicine

## 2015-12-12 VITALS — BP 133/73 | Ht 65.0 in | Wt 250.0 lb

## 2015-12-12 DIAGNOSIS — M4806 Spinal stenosis, lumbar region: Secondary | ICD-10-CM

## 2015-12-12 DIAGNOSIS — M25511 Pain in right shoulder: Secondary | ICD-10-CM | POA: Diagnosis not present

## 2015-12-12 DIAGNOSIS — M48061 Spinal stenosis, lumbar region without neurogenic claudication: Secondary | ICD-10-CM

## 2015-12-12 MED ORDER — METHYLPREDNISOLONE ACETATE 40 MG/ML IJ SUSP
40.0000 mg | Freq: Once | INTRAMUSCULAR | Status: AC
  Administered 2015-12-12: 40 mg via INTRA_ARTICULAR

## 2015-12-12 NOTE — Progress Notes (Signed)
   Subjective:    Patient ID: Andrea Perry, female    DOB: 1950/10/30, 65 y.o.   MRN: [id number]  HPI chief complaint: Right shoulder pain  Andrea Perry comes in today complaining of 3 weeks of right shoulder pain. She injured the shoulder by reaching across her body. She had an immediate onset of pain that was followed a couple days later with bruising along the anterior lateral shoulder. Her relatives were concerned and took her to the emergency room. They gave her a sling which she wore for a few days. She has since discontinued the sling. Her pain is improving but has not resolved. In addition to this injury, she is also complaining of some returning lateral shoulder pain which is consistent with some rotator cuff tendinopathy which she has been diagnosed with in the past. She has done well with subacromial cortisone injections for this. She would like a repeat injection today. She has not noticed any weakness. No numbness or tingling.  We also discussed the MRI of her lumbar spine which shows moderate to severe spinal stenosis. She would like to meet with a neurosurgeon to discuss treatment options.    Review of Systems     Objective:   Physical Exam  Well-developed, well-nourished. No acute distress. Awake alert and oriented 3.  Right shoulder: There is mild ecchymosis along the anterior lateral shoulder. No obvious Popeye deformity. Good biceps strength. She is tender to palpation primarily along the lateral deltoid. No palpable defect. She does have some reproducible pain with shoulder abduction. Full range of motion. Rotator cuff strength is 5/5. She does have some pain with resisted infraspinatus. She is neurovascularly intact distally.       Assessment & Plan:   Acute right shoulder injury likely secondary to partial biceps tendon/biceps muscle tear Rotator cuff tendinopathy Moderate to severe spinal stenosis  Subacromial space was injected with cortisone today. She tolerates  this without difficulty. Hopefully the pain from her partial biceps tendon/muscle tear will continue to improve. She will return to the office in 3 weeks for reevaluation. If symptoms persist or worsen, consider ultrasound evaluation. For her spinal stenosis, I've made a referral to either Dr. Ronnald Ramp or Dr. Ellene Route for their input regarding further treatment.  Consent obtained and verified. Time-out conducted. Noted no overlying erythema, induration, or other signs of local infection. Skin prepped in a sterile fashion. Topical analgesic spray: Ethyl chloride. Joint: right shoulder (subacromial) Needle: 25g 1.5 inch Completed without difficulty. Meds: 3cc 1% xylocaine, 1cc (40mg ) depomedrol  Advised to call if fevers/chills, erythema, induration, drainage, or persistent bleeding.

## 2015-12-19 ENCOUNTER — Other Ambulatory Visit: Payer: Self-pay | Admitting: Internal Medicine

## 2015-12-20 NOTE — Telephone Encounter (Signed)
Done hardcopy to Corinne  

## 2015-12-24 ENCOUNTER — Telehealth: Payer: Self-pay | Admitting: Emergency Medicine

## 2015-12-24 MED ORDER — BUDESONIDE-FORMOTEROL FUMARATE 160-4.5 MCG/ACT IN AERO: 1.0000 | INHALATION_SPRAY | Freq: Two times a day (BID) | RESPIRATORY_TRACT | 5 refills | Status: DC

## 2015-12-24 NOTE — Telephone Encounter (Signed)
Rx received, placed in addressed envelope and  Called spoke with patient, she is aware of the above Nothing further needed; will sign off

## 2015-12-24 NOTE — Telephone Encounter (Signed)
Signed script

## 2015-12-24 NOTE — Telephone Encounter (Addendum)
Pt saw RB on 8.22.17 and given savings card for Symbicort, however the card tells her that she needs an Rx with it.  She is requesting this be mailed to her (verified) home address Rx printed for RB to sign and placed with Ashtyn  Will route phone note to [facility]

## 2015-12-30 DIAGNOSIS — M4806 Spinal stenosis, lumbar region: Secondary | ICD-10-CM | POA: Diagnosis not present

## 2016-01-05 ENCOUNTER — Other Ambulatory Visit: Payer: Self-pay | Admitting: Internal Medicine

## 2016-01-07 ENCOUNTER — Telehealth: Payer: Self-pay | Admitting: Emergency Medicine

## 2016-01-07 NOTE — Telephone Encounter (Signed)
Spoke with the pt  She states that she is needing to know if RB will be able to clear her for back surgery  She was just seen here 12/10/15, but this was not discussed, b/c she didn't know that she would need to surgery then  She is not scheduled for this yet  Please advise if she needs another visit, or if you can clear her, thanks (pt aware RB out of the office this wk)

## 2016-01-13 ENCOUNTER — Ambulatory Visit (INDEPENDENT_AMBULATORY_CARE_PROVIDER_SITE_OTHER): Payer: Medicare Other | Admitting: Sports Medicine

## 2016-01-13 ENCOUNTER — Encounter: Payer: Self-pay | Admitting: Sports Medicine

## 2016-01-13 VITALS — BP 112/66 | HR 68 | Ht 62.0 in | Wt 255.0 lb

## 2016-01-13 DIAGNOSIS — M25511 Pain in right shoulder: Secondary | ICD-10-CM | POA: Diagnosis not present

## 2016-01-13 NOTE — Telephone Encounter (Signed)
Pt returning call.Andrea Perry ° °

## 2016-01-13 NOTE — Telephone Encounter (Signed)
LVM for pt to return call

## 2016-01-13 NOTE — Progress Notes (Signed)
   Subjective:    Patient ID: [name], female    DOB: [birth year], 65 y.o.   MRN: [id number]  HPI   Patient comes in today for follow-up on right shoulder pain. Pain is improved but not resolved. She still gets occasional catching in the right shoulder. She does state a recent subacromial cortisone injection was helpful. Since her last office visit with me, she has seen Dr. Sherley Bounds and is planning on having surgery for her spinal stenosis but is awaiting both cardiology and pulmonary clearance.    Review of Systems     Objective:   Physical Exam  Well-developed, well-nourished. No acute distress  Right shoulder: Full range of motion with a positive painful arc. Previous ecchymosis has resolved. No obvious deformity. Rotator cuff strength is 5/5 but reproducible pain with resisted supraspinatus. Pain with stressing of the biceps tendon as well. Neurovascular intact distally.       Assessment & Plan:   Right shoulder pain secondary to rotator cuff tendinopathy with probable partial proximal biceps tendon tear Lumbar spinal stenosis  I discussed imaging with the patient but we've decided to wait on that for now. Since she is improving she will simply give this a little more time. If she would like to return to the office at a later date for an ultrasound I would be happy to do that (this would be billed as a procedure only visit). Otherwise, follow-up with me as needed.

## 2016-01-13 NOTE — Telephone Encounter (Signed)
As long as she hasn't had any new problems since our visit, we should be able to write a letter to assess her risk based on her appt last month. Where does it need to be sent?

## 2016-01-13 NOTE — Telephone Encounter (Signed)
Attempted to contact pt. Line went dead. Will try back.

## 2016-01-14 ENCOUNTER — Telehealth: Payer: Self-pay | Admitting: Emergency Medicine

## 2016-01-14 NOTE — Telephone Encounter (Signed)
Andrea Gobble, MD  to [name], CMA     4:08 PM  Note    As long as she hasn't had any new problems since our visit, we should be able to write a letter to assess her risk based on her appt last month. Where does it need to be sent?     Called spoke with pt. Informed her of the above. She states the letter needs to be sent to Dr. Sherley Bounds at [facility]. I informed her I would send a message for clarification on what letter should say.   BQ please advise on what letter should say

## 2016-01-16 NOTE — Telephone Encounter (Signed)
RB please advise. We have a surgical clearance form in your look at to be signed if you would rather not write a letter.

## 2016-01-16 NOTE — Telephone Encounter (Signed)
Form signed.

## 2016-01-22 ENCOUNTER — Telehealth: Payer: Self-pay | Admitting: Cardiovascular Disease

## 2016-01-22 NOTE — Telephone Encounter (Signed)
New Message  Pt c/o medication issue:  1. Name of Medication: Eliquis  2. How are you currently taking this medication (dosage and times per day)? 5 mg tablets twice daily  3. Are you having a reaction (difficulty breathing--STAT)? No  4. What is your medication issue? Pt voiced wanting to know how long does she need to be off eliquis before she can get a tattoo.  Please f/u with pt

## 2016-01-22 NOTE — Telephone Encounter (Signed)
Will forward to pharmacy to advise.

## 2016-01-22 NOTE — Telephone Encounter (Signed)
Returned patient's call. She does not need to hold her Eliquis to get a tattoo. She verbalized understanding.

## 2016-01-31 ENCOUNTER — Other Ambulatory Visit: Payer: Self-pay | Admitting: Neurological Surgery

## 2016-02-12 ENCOUNTER — Encounter (HOSPITAL_COMMUNITY): Payer: Self-pay

## 2016-02-12 ENCOUNTER — Encounter (HOSPITAL_COMMUNITY)
Admission: RE | Admit: 2016-02-12 | Discharge: 2016-02-12 | Disposition: A | Discharge status: Home / Self Care | Payer: Medicare Other | Source: Ambulatory Visit | Attending: Neurological Surgery | Admitting: Neurological Surgery

## 2016-02-12 DIAGNOSIS — Z01818 Encounter for other preprocedural examination: Secondary | ICD-10-CM | POA: Diagnosis not present

## 2016-02-12 HISTORY — DX: Unspecified asthma, uncomplicated: J45.909

## 2016-02-12 HISTORY — DX: Unspecified urinary incontinence: R32

## 2016-02-12 HISTORY — DX: Sleep apnea, unspecified: G47.30

## 2016-02-12 HISTORY — DX: Malignant (primary) neoplasm, unspecified: C80.1

## 2016-02-12 LAB — BASIC METABOLIC PANEL
ANION GAP: 11 (ref 5–15)
BUN: 11 mg/dL (ref 6–20)
CALCIUM: 8.3 mg/dL — AB (ref 8.9–10.3)
CO2: 27 mmol/L (ref 22–32)
CREATININE: 0.73 mg/dL (ref 0.44–1.00)
Chloride: 100 mmol/L — ABNORMAL LOW (ref 101–111)
GFR calc Af Amer: >60 mL/min (ref >60)
GFR calc non Af Amer: >60 mL/min (ref >60)
GLUCOSE: 166 mg/dL — AB (ref 65–99)
Potassium: 3.6 mmol/L (ref 3.5–5.1)
Sodium: 138 mmol/L (ref 135–145)

## 2016-02-12 LAB — CBC WITH DIFFERENTIAL/PLATELET
BASOS PCT: 0 %
Basophils Absolute: 0 10*3/uL (ref 0.0–0.1)
EOS PCT: 2 %
Eosinophils Absolute: 0.2 10*3/uL (ref 0.0–0.7)
HEMATOCRIT: 40.5 % (ref 36.0–46.0)
Hemoglobin: 13.1 g/dL (ref 12.0–15.0)
Lymphocytes Relative: 41 %
Lymphs Abs: 3.8 10*3/uL (ref 0.7–4.0)
MCH: 30.5 pg (ref 26.0–34.0)
MCHC: 32.3 g/dL (ref 30.0–36.0)
MCV: 94.4 fL (ref 78.0–100.0)
MONO ABS: 0.5 10*3/uL (ref 0.1–1.0)
MONOS PCT: 5 %
NEUTROS ABS: 4.8 10*3/uL (ref 1.7–7.7)
Neutrophils Relative %: 52 %
Platelets: 268 10*3/uL (ref 150–400)
RBC: 4.29 MIL/uL (ref 3.87–5.11)
RDW: 13.5 % (ref 11.5–15.5)
WBC: 9.2 10*3/uL (ref 4.0–10.5)

## 2016-02-12 LAB — PROTIME-INR
INR: 1.01
Prothrombin Time: 13.3 seconds (ref 11.4–15.2)

## 2016-02-12 LAB — SURGICAL PCR SCREEN
MRSA, PCR: NEGATIVE
Staphylococcus aureus: NEGATIVE

## 2016-02-12 NOTE — Progress Notes (Signed)
PATIENT STATED HER LAST DOSE OF ELIQUIS WAS 10/24.

## 2016-02-12 NOTE — Pre-Procedure Instructions (Signed)
Andrea Perry  02/12/2016      [facility] [place], [place] - [address] [place] [address] Phone: [phone] Fax: [phone]    Your procedure is scheduled on  Wednesday  02/19/16  Report to [facility] Admitting at 830 A.M.  Call this number if you have problems the morning of surgery:  [phone]   Remember:  Do not eat food or drink liquids after midnight.  Take these medicines the morning of surgery with A SIP OF WATER   ALBUTEROL, AMLODIPINE (NORVASC), SYMBICORT, ESCITALOPRAM (LEXAPRO), FLECAINIDE (TAMBACOR), METOPROLOL (LOPRESSOR), PANTOPRAZOLE    (STOP NOW ASPIRIN OR ASPIRIN PRODUCTS, IBUPROFEN/ ADVIL/ MOTRIN/ GOODY POWDERS, BC'S, VIT E, FISH OIL, GARLIC,BLACK COHOSH, HERBAL MEDICINES, ELIQUIS AS DIRECTED)   Do not wear jewelry, make-up or nail polish.  Do not wear lotions, powders, or perfumes, or deoderant.  Do not shave 48 hours prior to surgery.  Men may shave face and neck.  Do not bring valuables to the hospital.  [facility] is not responsible for any belongings or valuables.  Contacts, dentures or bridgework may not be worn into surgery.  Leave your suitcase in the car.  After surgery it may be brought to your room.  For patients admitted to the hospital, discharge time will be determined by your treatment team.  Patients discharged the day of surgery will not be allowed to drive home.   Name and phone number of your driver:    Special instructions:  [facility] - Preparing for Surgery  Before surgery, you can play an important role.  Because skin is not sterile, your skin needs to be as free of germs as possible.  You can reduce the number of germs on you skin by washing with CHG (chlorahexidine gluconate) soap before surgery.  CHG is an antiseptic cleaner which kills germs and bonds with the skin to continue killing germs even after washing.  Please DO NOT use if you have an allergy to CHG  or antibacterial soaps.  If your skin becomes reddened/irritated stop using the CHG and inform your nurse when you arrive at Short Stay.  Do not shave (including legs and underarms) for at least 48 hours prior to the first CHG shower.  You may shave your face.  Please follow these instructions carefully:   1.  Shower with CHG Soap the night before surgery and the                                morning of Surgery.  2.  If you choose to wash your hair, wash your hair first as usual with your       normal shampoo.  3.  After you shampoo, rinse your hair and body thoroughly to remove the                      Shampoo.  4.  Use CHG as you would any other liquid soap.  You can apply chg directly       to the skin and wash gently with scrungie or a clean washcloth.  5.  Apply the CHG Soap to your body ONLY FROM THE NECK DOWN.        Do not use on open wounds or open sores.  Avoid contact with your eyes,       ears, mouth and genitals (private parts).  Wash genitals (private parts)       with your normal soap.  6.  Wash thoroughly, paying special attention to the area where your surgery        will be performed.  7.  Thoroughly rinse your body with warm water from the neck down.  8.  DO NOT shower/wash with your normal soap after using and rinsing off       the CHG Soap.  9.  Pat yourself dry with a clean towel.            10.  Wear clean pajamas.            11.  Place clean sheets on your bed the night of your first shower and do not        sleep with pets.  Day of Surgery  Do not apply any lotions/deoderants the morning of surgery.  Please wear clean clothes to the hospital/surgery center.    Please read over the following fact sheets that you were given. Pain Booklet, MRSA Information and Surgical Site Infection Prevention

## 2016-02-17 DIAGNOSIS — L720 Epidermal cyst: Secondary | ICD-10-CM | POA: Diagnosis not present

## 2016-02-18 MED ORDER — DEXAMETHASONE SODIUM PHOSPHATE 10 MG/ML IJ SOLN: 10.0000 mg | INTRAMUSCULAR | Status: AC

## 2016-02-18 MED ORDER — CEFAZOLIN SODIUM 10 G IJ SOLR
3.0000 g | INTRAMUSCULAR | Status: AC
  Filled 2016-02-18: qty 3000

## 2016-02-25 DIAGNOSIS — M48062 Spinal stenosis, lumbar region with neurogenic claudication: Secondary | ICD-10-CM | POA: Diagnosis not present

## 2016-02-27 ENCOUNTER — Other Ambulatory Visit: Payer: Self-pay | Admitting: Internal Medicine

## 2016-02-28 ENCOUNTER — Other Ambulatory Visit: Payer: Self-pay | Admitting: Obstetrics and Gynecology

## 2016-02-28 DIAGNOSIS — Z1231 Encounter for screening mammogram for malignant neoplasm of breast: Secondary | ICD-10-CM | POA: Diagnosis not present

## 2016-02-28 DIAGNOSIS — N951 Menopausal and female climacteric states: Secondary | ICD-10-CM | POA: Diagnosis not present

## 2016-02-28 DIAGNOSIS — Z01419 Encounter for gynecological examination (general) (routine) without abnormal findings: Secondary | ICD-10-CM | POA: Diagnosis not present

## 2016-02-28 DIAGNOSIS — R809 Proteinuria, unspecified: Secondary | ICD-10-CM | POA: Diagnosis not present

## 2016-03-02 LAB — CYTOLOGY - PAP

## 2016-03-18 ENCOUNTER — Other Ambulatory Visit (HOSPITAL_COMMUNITY): Payer: Self-pay | Admitting: *Deleted

## 2016-03-18 ENCOUNTER — Encounter (HOSPITAL_COMMUNITY): Payer: Self-pay

## 2016-03-18 ENCOUNTER — Encounter (HOSPITAL_COMMUNITY)
Admission: RE | Admit: 2016-03-18 | Discharge: 2016-03-18 | Disposition: A | Discharge status: Home / Self Care | Payer: Medicare Other | Source: Ambulatory Visit | Attending: Neurological Surgery | Admitting: Neurological Surgery

## 2016-03-18 DIAGNOSIS — Z87891 Personal history of nicotine dependence: Secondary | ICD-10-CM | POA: Diagnosis not present

## 2016-03-18 DIAGNOSIS — Z79890 Hormone replacement therapy (postmenopausal): Secondary | ICD-10-CM | POA: Diagnosis not present

## 2016-03-18 DIAGNOSIS — I4891 Unspecified atrial fibrillation: Secondary | ICD-10-CM | POA: Diagnosis not present

## 2016-03-18 DIAGNOSIS — Z79899 Other long term (current) drug therapy: Secondary | ICD-10-CM | POA: Diagnosis present

## 2016-03-18 DIAGNOSIS — K219 Gastro-esophageal reflux disease without esophagitis: Secondary | ICD-10-CM | POA: Diagnosis not present

## 2016-03-18 DIAGNOSIS — Z6841 Body Mass Index (BMI) 40.0 and over, adult: Secondary | ICD-10-CM | POA: Diagnosis not present

## 2016-03-18 DIAGNOSIS — M4727 Other spondylosis with radiculopathy, lumbosacral region: Secondary | ICD-10-CM | POA: Diagnosis not present

## 2016-03-18 DIAGNOSIS — F418 Other specified anxiety disorders: Secondary | ICD-10-CM | POA: Diagnosis not present

## 2016-03-18 DIAGNOSIS — Z85820 Personal history of malignant melanoma of skin: Secondary | ICD-10-CM | POA: Diagnosis not present

## 2016-03-18 DIAGNOSIS — Z01810 Encounter for preprocedural cardiovascular examination: Secondary | ICD-10-CM | POA: Insufficient documentation

## 2016-03-18 DIAGNOSIS — Z01818 Encounter for other preprocedural examination: Secondary | ICD-10-CM

## 2016-03-18 DIAGNOSIS — J449 Chronic obstructive pulmonary disease, unspecified: Secondary | ICD-10-CM | POA: Diagnosis not present

## 2016-03-18 DIAGNOSIS — F329 Major depressive disorder, single episode, unspecified: Secondary | ICD-10-CM | POA: Diagnosis not present

## 2016-03-18 DIAGNOSIS — Z7951 Long term (current) use of inhaled steroids: Secondary | ICD-10-CM | POA: Diagnosis not present

## 2016-03-18 DIAGNOSIS — I10 Essential (primary) hypertension: Secondary | ICD-10-CM | POA: Diagnosis not present

## 2016-03-18 DIAGNOSIS — E785 Hyperlipidemia, unspecified: Secondary | ICD-10-CM | POA: Diagnosis not present

## 2016-03-18 DIAGNOSIS — Z7901 Long term (current) use of anticoagulants: Secondary | ICD-10-CM | POA: Diagnosis not present

## 2016-03-18 DIAGNOSIS — M48061 Spinal stenosis, lumbar region without neurogenic claudication: Secondary | ICD-10-CM | POA: Diagnosis not present

## 2016-03-18 DIAGNOSIS — J309 Allergic rhinitis, unspecified: Secondary | ICD-10-CM | POA: Diagnosis not present

## 2016-03-18 HISTORY — DX: Pneumonia, unspecified organism: J18.9

## 2016-03-18 HISTORY — DX: Personal history of urinary calculi: Z87.442

## 2016-03-18 LAB — SURGICAL PCR SCREEN
MRSA, PCR: NEGATIVE
Staphylococcus aureus: NEGATIVE

## 2016-03-18 LAB — CBC
HEMATOCRIT: 40.1 % (ref 36.0–46.0)
HEMOGLOBIN: 13.1 g/dL (ref 12.0–15.0)
MCH: 31.2 pg (ref 26.0–34.0)
MCHC: 32.7 g/dL (ref 30.0–36.0)
MCV: 95.5 fL (ref 78.0–100.0)
Platelets: 260 10*3/uL (ref 150–400)
RBC: 4.2 MIL/uL (ref 3.87–5.11)
RDW: 13.4 % (ref 11.5–15.5)
WBC: 7.5 10*3/uL (ref 4.0–10.5)

## 2016-03-18 LAB — BASIC METABOLIC PANEL
ANION GAP: 13 (ref 5–15)
BUN: 11 mg/dL (ref 6–20)
CO2: 29 mmol/L (ref 22–32)
Calcium: 8.7 mg/dL — ABNORMAL LOW (ref 8.9–10.3)
Chloride: 101 mmol/L (ref 101–111)
Creatinine, Ser: 0.73 mg/dL (ref 0.44–1.00)
GFR calc Af Amer: >60 mL/min (ref >60)
GFR calc non Af Amer: >60 mL/min (ref >60)
GLUCOSE: 145 mg/dL — AB (ref 65–99)
POTASSIUM: 4.3 mmol/L (ref 3.5–5.1)
Sodium: 143 mmol/L (ref 135–145)

## 2016-03-18 LAB — PROTIME-INR
INR: 1
Prothrombin Time: 13.2 seconds (ref 11.4–15.2)

## 2016-03-18 NOTE — Progress Notes (Signed)
Pt has hx of A-fib. Last EKG in July, 2017 showed A-fib with rate of 121. Repeated EKG today, still in A-fib but rate is 90. Pt states she does not have any symptoms when she is in A-fib, usually doesn't know she's in it unless she has had an EKG or a doctor has listened to her heart. Last dose of Eliquis was 03/13/16.

## 2016-03-18 NOTE — Pre-Procedure Instructions (Signed)
Andrea Perry  03/18/2016    Your procedure is scheduled on Thursday, March 19, 2016 at 7:30 AM.   Report to [facility] Entrance "A" Admitting Office at 5:30 AM.   Call this number if you have problems the morning of surgery: [phone]   Remember:  Do not eat food or drink liquids after midnight tonight.  Take these medicines the morning of surgery with A SIP OF WATER: Amlodipine (Norvasc), Cetirizine (Zyrtec), Escitalopram (Lexapro), Flecainide (Tambocor), Metoprolol (Lopressor), Pantoprazole (Protonix), Symbicort inhaler, Albuterol (Ventolin) inhaler (Bring this inhaler with you in the AM)   Do not wear jewelry, make-up or nail polish.  Do not wear lotions, powders, or perfumes.  Do not shave 48 hours prior to surgery.    Do not bring valuables to the hospital.  [facility] is not responsible for any belongings or valuables.  Contacts, dentures or bridgework may not be worn into surgery.  Leave your suitcase in the car.  After surgery it may be brought to your room.  For patients admitted to the hospital, discharge time will be determined by your treatment team.  Special instructions:  [facility] - Preparing for Surgery  Before surgery, you can play an important role.  Because skin is not sterile, your skin needs to be as free of germs as possible.  You can reduce the number of germs on you skin by washing with CHG (chlorahexidine gluconate) soap before surgery.  CHG is an antiseptic cleaner which kills germs and bonds with the skin to continue killing germs even after washing.  Please DO NOT use if you have an allergy to CHG or antibacterial soaps.  If your skin becomes reddened/irritated stop using the CHG and inform your nurse when you arrive at Short Stay.  Do not shave (including legs and underarms) for at least 48 hours prior to the first CHG shower.  You may shave your face.  Please follow these instructions carefully:   1.  Shower with CHG Soap the  night before surgery and the                    morning of Surgery.  2.  If you choose to wash your hair, wash your hair first as usual with your       normal shampoo.  3.  After you shampoo, rinse your hair and body thoroughly to remove the shampoo.  4.  Use CHG as you would any other liquid soap.  You can apply chg directly       to the skin and wash gently with scrungie or a clean washcloth.  5.  Apply the CHG Soap to your body ONLY FROM THE NECK DOWN.        Do not use on open wounds or open sores.  Avoid contact with your eyes, ears, mouth and genitals (private parts).  Wash genitals (private parts) with your normal soap.  6.  Wash thoroughly, paying special attention to the area where your surgery        will be performed.  7.  Thoroughly rinse your body with warm water from the neck down.  8.  DO NOT shower/wash with your normal soap after using and rinsing off       the CHG Soap.  9.  Pat yourself dry with a clean towel.            10.  Wear clean pajamas.  11.  Place clean sheets on your bed the night of your first shower and do not        sleep with pets.  Day of Surgery  Do not apply any lotions the morning of surgery.  Please wear clean clothes to the hospital.   Please read over the fact sheets that you were given.

## 2016-03-18 NOTE — Progress Notes (Signed)
Anesthesia Chart Review:  Pt is a 65 year old female scheduled for L3-4, L4-5 laminectomy and foraminotomy on 03/19/2016 with Sherley Bounds, MD.   - Cardiologist is Jenkins Rouge, MD, last office visit 10/07/15 - PCP is Cathlean Cower, MD.  - Pulmonologist is Baltazar Apo, MD  PMH includes:  PAF, HTN, hyperlipidemia, OSA, asthma, impaired glucose tolerance, pulmonary HTN, melanoma, GERD. Former smoker. BMI 49  Medications include: albuterol, amlodipine,symbicort, eliquis, flecainide, lasix, irbesartan, metoprolol, protonix, potassium, pravastatin.  Pt stopped eliquis 03/13/16.   Preoperative labs reviewed.    CXR 08/16/15: No active cardiopulmonary disease.  EKG 03/18/16: Atrial fibrillation (90 bpm). Low voltage QRS. Nonspecific ST and T wave abnormality  MUGA 11/29/14:   Normal MUGA study.  Ejection of 52%  Nuclear stress test 04/23/14: Low risk stress nuclear study with small size and mild intensity fixed distal anteroapical breast attenuation artifact. LV Ejection Fraction: 44%.  LV Wall Motion:  Mild global hypokinesis   Echo 02/12/14:  - Left ventricle: Inferobasal hypokinesis. The cavity size wasnormal. Wall thickness was normal. Systolic function was normal. The estimated ejection fraction was in the range of 50% to 55%. - Left atrium: The atrium was mildly dilated. - Atrial septum: No defect or patent foramen ovale was identified. - Pulmonary arteries: PA peak pressure: 41 mm Hg (S).  If no changes, I anticipate pt can proceed with surgery as scheduled.   Willeen Cass, FNP-BC [facility]/Anesthesiology Phone: [phone] 03/18/2016 4:24 PM

## 2016-03-18 NOTE — Anesthesia Preprocedure Evaluation (Addendum)
Anesthesia Evaluation  Patient identified by MRN, date of birth, ID band Patient awake    Reviewed: Allergy & Precautions, H&P , NPO status , Patient's Chart, lab work & pertinent test results, reviewed documented beta blocker date and time   Airway Mallampati: III  TM Distance: >3 FB Neck ROM: Full    Dental no notable dental hx. (+) Teeth Intact, Dental Advisory Given   Pulmonary asthma , sleep apnea , COPD,  COPD inhaler, former smoker,    Pulmonary exam normal breath sounds clear to auscultation       Cardiovascular hypertension, Pt. on medications and Pt. on home beta blockers + dysrhythmias Atrial Fibrillation  Rhythm:Irregular Rate:Normal     Neuro/Psych Anxiety Depression negative neurological ROS     GI/Hepatic Neg liver ROS, GERD  Medicated and Controlled,  Endo/Other  Morbid obesity  Renal/GU negative Renal ROS  negative genitourinary   Musculoskeletal  (+) Arthritis , Osteoarthritis,    Abdominal   Peds  Hematology negative hematology ROS (+)   Anesthesia Other Findings   Reproductive/Obstetrics negative OB ROS                           Anesthesia Physical Anesthesia Plan  ASA: III  Anesthesia Plan: General   Post-op Pain Management:    Induction: Intravenous  Airway Management Planned: Oral ETT  Additional Equipment:   Intra-op Plan:   Post-operative Plan: Extubation in OR  Informed Consent: I have reviewed the patients History and Physical, chart, labs and discussed the procedure including the risks, benefits and alternatives for the proposed anesthesia with the patient or authorized representative who has indicated his/her understanding and acceptance.   Dental advisory given  Plan Discussed with: CRNA  Anesthesia Plan Comments:         Anesthesia Quick Evaluation

## 2016-03-19 ENCOUNTER — Ambulatory Visit (HOSPITAL_COMMUNITY): Payer: Medicare Other

## 2016-03-19 ENCOUNTER — Ambulatory Visit (HOSPITAL_COMMUNITY): Payer: Medicare Other | Admitting: Anesthesiology

## 2016-03-19 ENCOUNTER — Encounter (HOSPITAL_COMMUNITY): Payer: Self-pay | Admitting: *Deleted

## 2016-03-19 ENCOUNTER — Encounter (HOSPITAL_COMMUNITY)
Admission: RE | Disposition: A | Discharge status: Home / Self Care | Payer: Self-pay | Source: Ambulatory Visit | Attending: Neurological Surgery

## 2016-03-19 ENCOUNTER — Ambulatory Visit (HOSPITAL_COMMUNITY)
Admission: RE | Admit: 2016-03-19 | Discharge: 2016-03-22 | Disposition: A | Discharge status: Home / Self Care | Payer: Medicare Other | Source: Ambulatory Visit | Attending: Neurological Surgery | Admitting: Neurological Surgery

## 2016-03-19 ENCOUNTER — Ambulatory Visit (HOSPITAL_COMMUNITY): Payer: Medicare Other | Admitting: Emergency Medicine

## 2016-03-19 DIAGNOSIS — I10 Essential (primary) hypertension: Secondary | ICD-10-CM | POA: Insufficient documentation

## 2016-03-19 DIAGNOSIS — M48061 Spinal stenosis, lumbar region without neurogenic claudication: Secondary | ICD-10-CM | POA: Insufficient documentation

## 2016-03-19 DIAGNOSIS — I4891 Unspecified atrial fibrillation: Secondary | ICD-10-CM | POA: Insufficient documentation

## 2016-03-19 DIAGNOSIS — F418 Other specified anxiety disorders: Secondary | ICD-10-CM | POA: Insufficient documentation

## 2016-03-19 DIAGNOSIS — F329 Major depressive disorder, single episode, unspecified: Secondary | ICD-10-CM | POA: Insufficient documentation

## 2016-03-19 DIAGNOSIS — Z79890 Hormone replacement therapy (postmenopausal): Secondary | ICD-10-CM | POA: Insufficient documentation

## 2016-03-19 DIAGNOSIS — K219 Gastro-esophageal reflux disease without esophagitis: Secondary | ICD-10-CM | POA: Diagnosis not present

## 2016-03-19 DIAGNOSIS — Z7901 Long term (current) use of anticoagulants: Secondary | ICD-10-CM | POA: Insufficient documentation

## 2016-03-19 DIAGNOSIS — Z419 Encounter for procedure for purposes other than remedying health state, unspecified: Secondary | ICD-10-CM

## 2016-03-19 DIAGNOSIS — Z9889 Other specified postprocedural states: Secondary | ICD-10-CM

## 2016-03-19 DIAGNOSIS — Z79899 Other long term (current) drug therapy: Secondary | ICD-10-CM | POA: Insufficient documentation

## 2016-03-19 DIAGNOSIS — J449 Chronic obstructive pulmonary disease, unspecified: Secondary | ICD-10-CM | POA: Insufficient documentation

## 2016-03-19 DIAGNOSIS — Z7951 Long term (current) use of inhaled steroids: Secondary | ICD-10-CM | POA: Insufficient documentation

## 2016-03-19 DIAGNOSIS — J309 Allergic rhinitis, unspecified: Secondary | ICD-10-CM | POA: Insufficient documentation

## 2016-03-19 DIAGNOSIS — Z85820 Personal history of malignant melanoma of skin: Secondary | ICD-10-CM | POA: Insufficient documentation

## 2016-03-19 DIAGNOSIS — E785 Hyperlipidemia, unspecified: Secondary | ICD-10-CM | POA: Diagnosis not present

## 2016-03-19 DIAGNOSIS — M4727 Other spondylosis with radiculopathy, lumbosacral region: Secondary | ICD-10-CM | POA: Diagnosis not present

## 2016-03-19 DIAGNOSIS — Z981 Arthrodesis status: Secondary | ICD-10-CM | POA: Diagnosis not present

## 2016-03-19 DIAGNOSIS — Z87891 Personal history of nicotine dependence: Secondary | ICD-10-CM | POA: Insufficient documentation

## 2016-03-19 DIAGNOSIS — Z6841 Body Mass Index (BMI) 40.0 and over, adult: Secondary | ICD-10-CM | POA: Insufficient documentation

## 2016-03-19 HISTORY — PX: LUMBAR LAMINECTOMY/DECOMPRESSION MICRODISCECTOMY: SHX5026

## 2016-03-19 SURGERY — LUMBAR LAMINECTOMY/DECOMPRESSION MICRODISCECTOMY 2 LEVELS
Anesthesia: General | Site: Back

## 2016-03-19 MED ORDER — MIDAZOLAM HCL 2 MG/2ML IJ SOLN
INTRAMUSCULAR | Status: AC
  Filled 2016-03-19: qty 2

## 2016-03-19 MED ORDER — ESCITALOPRAM OXALATE 10 MG PO TABS
20.0000 mg | ORAL_TABLET | Freq: Every day | ORAL | Status: DC
  Administered 2016-03-19 – 2016-03-21 (×3): 20 mg via ORAL
  Filled 2016-03-19: qty 2
  Filled 2016-03-19 (×2): qty 1

## 2016-03-19 MED ORDER — LACTATED RINGERS IV SOLN
INTRAVENOUS | Status: DC | PRN
  Administered 2016-03-19 (×2): via INTRAVENOUS

## 2016-03-19 MED ORDER — LIDOCAINE HCL (CARDIAC) 20 MG/ML IV SOLN
INTRAVENOUS | Status: DC | PRN
  Administered 2016-03-19: 60 mg via INTRAVENOUS

## 2016-03-19 MED ORDER — BACITRACIN 50000 UNITS IM SOLR
INTRAMUSCULAR | Status: DC | PRN
  Administered 2016-03-19: 08:00:00

## 2016-03-19 MED ORDER — FENTANYL CITRATE (PF) 100 MCG/2ML IJ SOLN
INTRAMUSCULAR | Status: AC
  Filled 2016-03-19: qty 2

## 2016-03-19 MED ORDER — CHLORHEXIDINE GLUCONATE CLOTH 2 % EX PADS: 6.0000 | MEDICATED_PAD | Freq: Once | CUTANEOUS | Status: DC

## 2016-03-19 MED ORDER — PHENYLEPHRINE HCL 10 MG/ML IJ SOLN
INTRAVENOUS | Status: DC | PRN
  Administered 2016-03-19: 50 ug/min via INTRAVENOUS

## 2016-03-19 MED ORDER — ROCURONIUM BROMIDE 10 MG/ML (PF) SYRINGE
PREFILLED_SYRINGE | INTRAVENOUS | Status: AC
  Filled 2016-03-19: qty 10

## 2016-03-19 MED ORDER — THROMBIN 5000 UNITS EX SOLR
CUTANEOUS | Status: AC
  Filled 2016-03-19: qty 20000

## 2016-03-19 MED ORDER — FLECAINIDE ACETATE 50 MG PO TABS
50.0000 mg | ORAL_TABLET | Freq: Two times a day (BID) | ORAL | Status: DC
  Administered 2016-03-19 – 2016-03-22 (×6): 50 mg via ORAL
  Filled 2016-03-19 (×7): qty 1

## 2016-03-19 MED ORDER — FUROSEMIDE 40 MG PO TABS
40.0000 mg | ORAL_TABLET | Freq: Every day | ORAL | Status: DC
  Administered 2016-03-20 – 2016-03-22 (×3): 40 mg via ORAL
  Filled 2016-03-19 (×4): qty 1

## 2016-03-19 MED ORDER — THROMBIN 5000 UNITS EX SOLR
OROMUCOSAL | Status: DC | PRN
  Administered 2016-03-19: 08:00:00 via TOPICAL

## 2016-03-19 MED ORDER — PANTOPRAZOLE SODIUM 40 MG PO TBEC
40.0000 mg | DELAYED_RELEASE_TABLET | Freq: Every day | ORAL | Status: DC
  Administered 2016-03-19 – 2016-03-22 (×4): 40 mg via ORAL
  Filled 2016-03-19 (×4): qty 1

## 2016-03-19 MED ORDER — PROPOFOL 10 MG/ML IV BOLUS
INTRAVENOUS | Status: AC
  Filled 2016-03-19: qty 20

## 2016-03-19 MED ORDER — BUPIVACAINE HCL (PF) 0.25 % IJ SOLN
INTRAMUSCULAR | Status: AC
  Filled 2016-03-19: qty 30

## 2016-03-19 MED ORDER — PROPOFOL 10 MG/ML IV BOLUS
INTRAVENOUS | Status: DC | PRN
  Administered 2016-03-19: 150 mg via INTRAVENOUS
  Administered 2016-03-19 (×2): 30 mg via INTRAVENOUS
  Administered 2016-03-19: 20 mg via INTRAVENOUS

## 2016-03-19 MED ORDER — SODIUM CHLORIDE 0.9% FLUSH: 3.0000 mL | INTRAVENOUS | Status: DC | PRN

## 2016-03-19 MED ORDER — CEFAZOLIN IN D5W 1 GM/50ML IV SOLN
1.0000 g | Freq: Three times a day (TID) | INTRAVENOUS | Status: AC
  Administered 2016-03-19 (×2): 1 g via INTRAVENOUS
  Filled 2016-03-19 (×2): qty 50

## 2016-03-19 MED ORDER — MIDAZOLAM HCL 5 MG/5ML IJ SOLN
INTRAMUSCULAR | Status: DC | PRN
  Administered 2016-03-19: 2 mg via INTRAVENOUS

## 2016-03-19 MED ORDER — VANCOMYCIN HCL 1000 MG IV SOLR
INTRAVENOUS | Status: DC | PRN
  Administered 2016-03-19: 1000 mg via TOPICAL

## 2016-03-19 MED ORDER — HYDROMORPHONE HCL 1 MG/ML IJ SOLN
INTRAMUSCULAR | Status: AC
  Filled 2016-03-19: qty 1

## 2016-03-19 MED ORDER — POTASSIUM CHLORIDE IN NACL 20-0.9 MEQ/L-% IV SOLN: INTRAVENOUS | Status: DC

## 2016-03-19 MED ORDER — ONDANSETRON HCL 4 MG/2ML IJ SOLN
4.0000 mg | INTRAMUSCULAR | Status: DC | PRN
Start: 2016-03-19 — End: 2016-03-22
  Filled 2016-03-19: qty 2

## 2016-03-19 MED ORDER — ACETAMINOPHEN 650 MG RE SUPP: 650.0000 mg | RECTAL | Status: DC | PRN

## 2016-03-19 MED ORDER — PHENYLEPHRINE 40 MCG/ML (10ML) SYRINGE FOR IV PUSH (FOR BLOOD PRESSURE SUPPORT)
PREFILLED_SYRINGE | INTRAVENOUS | Status: DC | PRN
  Administered 2016-03-19 (×2): 80 ug via INTRAVENOUS

## 2016-03-19 MED ORDER — ONDANSETRON HCL 4 MG/2ML IJ SOLN
INTRAMUSCULAR | Status: DC | PRN
  Administered 2016-03-19: 4 mg via INTRAVENOUS

## 2016-03-19 MED ORDER — ARTIFICIAL TEARS OP OINT
TOPICAL_OINTMENT | OPHTHALMIC | Status: DC | PRN
  Administered 2016-03-19: 1 via OPHTHALMIC

## 2016-03-19 MED ORDER — PHENOL 1.4 % MT LIQD: 1.0000 | OROMUCOSAL | Status: DC | PRN

## 2016-03-19 MED ORDER — BUPIVACAINE HCL (PF) 0.25 % IJ SOLN
INTRAMUSCULAR | Status: DC | PRN
  Administered 2016-03-19: 5 mL

## 2016-03-19 MED ORDER — FENTANYL CITRATE (PF) 100 MCG/2ML IJ SOLN
INTRAMUSCULAR | Status: AC
  Filled 2016-03-19: qty 4

## 2016-03-19 MED ORDER — HEMOSTATIC AGENTS (NO CHARGE) OPTIME
TOPICAL | Status: DC | PRN
  Administered 2016-03-19: 1 via TOPICAL

## 2016-03-19 MED ORDER — IRBESARTAN 150 MG PO TABS
150.0000 mg | ORAL_TABLET | Freq: Every day | ORAL | Status: DC
  Administered 2016-03-20 – 2016-03-22 (×3): 150 mg via ORAL
  Filled 2016-03-19 (×3): qty 1

## 2016-03-19 MED ORDER — LIDOCAINE 2% (20 MG/ML) 5 ML SYRINGE
INTRAMUSCULAR | Status: AC
  Filled 2016-03-19: qty 5

## 2016-03-19 MED ORDER — OXYCODONE-ACETAMINOPHEN 5-325 MG PO TABS
1.0000 | ORAL_TABLET | ORAL | Status: DC | PRN
  Administered 2016-03-19 – 2016-03-22 (×11): 2 via ORAL
  Filled 2016-03-19 (×13): qty 2

## 2016-03-19 MED ORDER — THROMBIN 5000 UNITS EX SOLR
CUTANEOUS | Status: DC | PRN
  Administered 2016-03-19 (×2): 5000 [IU] via TOPICAL

## 2016-03-19 MED ORDER — CEFAZOLIN SODIUM-DEXTROSE 2-3 GM-% IV SOLR: INTRAVENOUS | Status: DC | PRN

## 2016-03-19 MED ORDER — CEFAZOLIN SODIUM-DEXTROSE 2-3 GM-% IV SOLR
INTRAVENOUS | Status: DC | PRN
  Administered 2016-03-19: 2 g via INTRAVENOUS

## 2016-03-19 MED ORDER — SODIUM CHLORIDE 0.9% FLUSH: 3.0000 mL | Freq: Two times a day (BID) | INTRAVENOUS | Status: DC

## 2016-03-19 MED ORDER — DEXAMETHASONE SODIUM PHOSPHATE 10 MG/ML IJ SOLN
INTRAMUSCULAR | Status: DC | PRN
  Administered 2016-03-19: 5 mg via INTRAVENOUS

## 2016-03-19 MED ORDER — MOMETASONE FURO-FORMOTEROL FUM 200-5 MCG/ACT IN AERO
2.0000 | INHALATION_SPRAY | Freq: Two times a day (BID) | RESPIRATORY_TRACT | Status: DC
  Administered 2016-03-19 – 2016-03-22 (×5): 2 via RESPIRATORY_TRACT
  Filled 2016-03-19: qty 8.8

## 2016-03-19 MED ORDER — HYDROMORPHONE HCL 1 MG/ML IJ SOLN
INTRAMUSCULAR | Status: AC
  Filled 2016-03-19: qty 0.5

## 2016-03-19 MED ORDER — METOPROLOL TARTRATE 25 MG PO TABS
75.0000 mg | ORAL_TABLET | Freq: Two times a day (BID) | ORAL | Status: DC
  Administered 2016-03-19 – 2016-03-22 (×6): 75 mg via ORAL
  Filled 2016-03-19: qty 1
  Filled 2016-03-19: qty 3
  Filled 2016-03-19 (×2): qty 1
  Filled 2016-03-19: qty 3
  Filled 2016-03-19: qty 1

## 2016-03-19 MED ORDER — ACETAMINOPHEN 325 MG PO TABS: 650.0000 mg | ORAL_TABLET | ORAL | Status: DC | PRN

## 2016-03-19 MED ORDER — MORPHINE SULFATE (PF) 4 MG/ML IV SOLN
1.0000 mg | INTRAVENOUS | Status: DC | PRN
  Administered 2016-03-19 – 2016-03-20 (×2): 4 mg via INTRAVENOUS
  Filled 2016-03-19 (×2): qty 1

## 2016-03-19 MED ORDER — AMLODIPINE BESYLATE 10 MG PO TABS
10.0000 mg | ORAL_TABLET | Freq: Every day | ORAL | Status: DC
  Administered 2016-03-20 – 2016-03-22 (×3): 10 mg via ORAL
  Filled 2016-03-19 (×3): qty 1

## 2016-03-19 MED ORDER — VANCOMYCIN HCL 1000 MG IV SOLR
INTRAVENOUS | Status: AC
  Filled 2016-03-19: qty 1000

## 2016-03-19 MED ORDER — SUGAMMADEX SODIUM 200 MG/2ML IV SOLN
INTRAVENOUS | Status: DC | PRN
  Administered 2016-03-19: 250 mg via INTRAVENOUS

## 2016-03-19 MED ORDER — ROCURONIUM BROMIDE 100 MG/10ML IV SOLN
INTRAVENOUS | Status: DC | PRN
  Administered 2016-03-19: 60 mg via INTRAVENOUS

## 2016-03-19 MED ORDER — FENTANYL CITRATE (PF) 100 MCG/2ML IJ SOLN
INTRAMUSCULAR | Status: DC | PRN
  Administered 2016-03-19: 50 ug via INTRAVENOUS
  Administered 2016-03-19 (×2): 25 ug via INTRAVENOUS
  Administered 2016-03-19: 50 ug via INTRAVENOUS
  Administered 2016-03-19: 100 ug via INTRAVENOUS
  Administered 2016-03-19 (×2): 25 ug via INTRAVENOUS

## 2016-03-19 MED ORDER — MENTHOL 3 MG MT LOZG
1.0000 | LOZENGE | OROMUCOSAL | Status: DC | PRN
  Filled 2016-03-19: qty 9

## 2016-03-19 MED ORDER — 0.9 % SODIUM CHLORIDE (POUR BTL) OPTIME
TOPICAL | Status: DC | PRN
  Administered 2016-03-19: 1000 mL

## 2016-03-19 MED ORDER — SODIUM CHLORIDE 0.9 % IV SOLN: 250.0000 mL | INTRAVENOUS | Status: DC

## 2016-03-19 MED ORDER — ONDANSETRON HCL 4 MG/2ML IJ SOLN
INTRAMUSCULAR | Status: AC
  Filled 2016-03-19: qty 2

## 2016-03-19 MED ORDER — HYDROMORPHONE HCL 1 MG/ML IJ SOLN
0.2500 mg | INTRAMUSCULAR | Status: DC | PRN
  Administered 2016-03-19 (×3): 0.5 mg via INTRAVENOUS

## 2016-03-19 MED ORDER — ALBUTEROL SULFATE (2.5 MG/3ML) 0.083% IN NEBU: 2.5000 mg | INHALATION_SOLUTION | Freq: Four times a day (QID) | RESPIRATORY_TRACT | Status: DC | PRN

## 2016-03-19 SURGICAL SUPPLY — 49 items
BAG DECANTER FOR FLEXI CONT (MISCELLANEOUS) ×2 IMPLANT
BENZOIN TINCTURE PRP APPL 2/3 (GAUZE/BANDAGES/DRESSINGS) ×2 IMPLANT
BUR MATCHSTICK NEURO 3.0 LAGG (BURR) ×2 IMPLANT
CANISTER SUCT 3000ML PPV (MISCELLANEOUS) ×2 IMPLANT
CARTRIDGE OIL MAESTRO DRILL (MISCELLANEOUS) ×1 IMPLANT
DERMABOND ADVANCED (GAUZE/BANDAGES/DRESSINGS) ×1
DERMABOND ADVANCED .7 DNX12 (GAUZE/BANDAGES/DRESSINGS) ×1 IMPLANT
DIFFUSER DRILL AIR PNEUMATIC (MISCELLANEOUS) ×2 IMPLANT
DRAPE LAPAROTOMY 100X72X124 (DRAPES) ×2 IMPLANT
DRAPE MICROSCOPE LEICA (MISCELLANEOUS) IMPLANT
DRAPE POUCH INSTRU U-SHP 10X18 (DRAPES) ×2 IMPLANT
DRAPE SURG 17X23 STRL (DRAPES) ×2 IMPLANT
DRSG OPSITE POSTOP 4X6 (GAUZE/BANDAGES/DRESSINGS) ×2 IMPLANT
DURAPREP 26ML APPLICATOR (WOUND CARE) ×2 IMPLANT
ELECT BLADE 4.0 EZ CLEAN MEGAD (MISCELLANEOUS) ×2
ELECT REM PT RETURN 9FT ADLT (ELECTROSURGICAL) ×2
ELECTRODE BLDE 4.0 EZ CLN MEGD (MISCELLANEOUS) ×1 IMPLANT
ELECTRODE REM PT RTRN 9FT ADLT (ELECTROSURGICAL) ×1 IMPLANT
GAUZE SPONGE 4X4 16PLY XRAY LF (GAUZE/BANDAGES/DRESSINGS) IMPLANT
GLOVE BIO SURGEON STRL SZ8 (GLOVE) ×2 IMPLANT
GLOVE ECLIPSE 7.0 STRL STRAW (GLOVE) ×2 IMPLANT
GLOVE ECLIPSE 8.0 STRL XLNG CF (GLOVE) ×4 IMPLANT
GLOVE INDICATOR 7.5 STRL GRN (GLOVE) ×6 IMPLANT
GLOVE SURG SS PI 7.0 STRL IVOR (GLOVE) ×6 IMPLANT
GOWN STRL REUS W/ TWL LRG LVL3 (GOWN DISPOSABLE) ×2 IMPLANT
GOWN STRL REUS W/ TWL XL LVL3 (GOWN DISPOSABLE) ×1 IMPLANT
GOWN STRL REUS W/TWL 2XL LVL3 (GOWN DISPOSABLE) IMPLANT
GOWN STRL REUS W/TWL LRG LVL3 (GOWN DISPOSABLE) ×2
GOWN STRL REUS W/TWL XL LVL3 (GOWN DISPOSABLE) ×1
HEMOSTAT POWDER KIT SURGIFOAM (HEMOSTASIS) ×2 IMPLANT
KIT BASIN OR (CUSTOM PROCEDURE TRAY) ×2 IMPLANT
KIT ROOM TURNOVER OR (KITS) ×2 IMPLANT
NEEDLE HYPO 25X1 1.5 SAFETY (NEEDLE) ×2 IMPLANT
NEEDLE SPNL 20GX3.5 QUINCKE YW (NEEDLE) ×2 IMPLANT
NS IRRIG 1000ML POUR BTL (IV SOLUTION) ×2 IMPLANT
OIL CARTRIDGE MAESTRO DRILL (MISCELLANEOUS) ×2
PACK LAMINECTOMY NEURO (CUSTOM PROCEDURE TRAY) ×2 IMPLANT
PAD ARMBOARD 7.5X6 YLW CONV (MISCELLANEOUS) ×6 IMPLANT
RUBBERBAND STERILE (MISCELLANEOUS) ×4 IMPLANT
SPONGE SURGIFOAM ABS GEL SZ50 (HEMOSTASIS) ×2 IMPLANT
STRIP CLOSURE SKIN 1/2X4 (GAUZE/BANDAGES/DRESSINGS) ×2 IMPLANT
SUT VIC AB 0 CT1 18XCR BRD8 (SUTURE) ×2 IMPLANT
SUT VIC AB 0 CT1 8-18 (SUTURE) ×2
SUT VIC AB 2-0 CP2 18 (SUTURE) ×4 IMPLANT
SUT VIC AB 3-0 SH 8-18 (SUTURE) ×4 IMPLANT
TOWEL OR 17X24 6PK STRL BLUE (TOWEL DISPOSABLE) ×2 IMPLANT
TOWEL OR 17X26 10 PK STRL BLUE (TOWEL DISPOSABLE) ×2 IMPLANT
TRAY FOLEY BAG SILVER LF 16FR (CATHETERS) ×2 IMPLANT
WATER STERILE IRR 1000ML POUR (IV SOLUTION) ×2 IMPLANT

## 2016-03-19 NOTE — Anesthesia Procedure Notes (Signed)
Procedure Name: Intubation Date/Time: 03/19/2016 7:36 AM Performed by: Suzy Bouchard Pre-anesthesia Checklist: Patient identified, Emergency Drugs available, Suction available and Patient being monitored Patient Re-evaluated:Patient Re-evaluated prior to inductionOxygen Delivery Method: Circle system utilized Preoxygenation: Pre-oxygenation with 100% oxygen Intubation Type: IV induction Ventilation: Mask ventilation without difficulty Laryngoscope Size: Glidescope and 3 Grade View: Grade I Tube type: Oral Tube size: 7.0 mm Number of attempts: 1 Airway Equipment and Method: Stylet,  Bite block and Video-laryngoscopy Placement Confirmation: ETT inserted through vocal cords under direct vision,  positive ETCO2 and breath sounds checked- equal and bilateral Secured at: 23 cm Tube secured with: Tape Dental Injury: Teeth and Oropharynx as per pre-operative assessment  Comments: Intubation performed by M. Luther Bradley

## 2016-03-19 NOTE — Op Note (Signed)
03/19/2016  9:53 AM  PATIENT:  Andrea Perry  65 y.o. female  PRE-OPERATIVE DIAGNOSIS:  Spinal stenosis L3-4 L4-5  POST-OPERATIVE DIAGNOSIS:  Same  PROCEDURE:  Decompressive lumbar hemilaminectomy and medial facetectomy and foraminotomy L3-4 and L4-5 on the left followed by sublaminar decompression  SURGEON:  Sherley Bounds, MD  ASSISTANTS: Dr. Kathyrn Sheriff  ANESTHESIA:   General  EBL: Less than 100 ml  Total I/O In: 1000 [I.V.:1000] Out: 90 [Urine:90]  BLOOD ADMINISTERED:none  DRAINS: none   SPECIMEN:  No Specimen  INDICATION FOR PROCEDURE: This patient presented with severe back pain with bilateral hip pain. She had occasional left leg pain. MRI showed severe spinal stenosis at L3-4 and L4-5. She tried medical management without relief. I recommended decompressive laminectomies. Patient understood the risks, benefits, and alternatives and potential outcomes and wished to proceed.  PROCEDURE DETAILS: The patient was taken to the operating room and after induction of adequate generalized endotracheal anesthesia, the patient was rolled into the prone position on the Wilson frame and all pressure points were padded. The lumbar region was cleaned and then prepped with DuraPrep and draped in the usual sterile fashion. 5 cc of local anesthesia was injected and then a dorsal midline incision was made and carried down to the lumbo sacral fascia. The fascia was opened and the paraspinous musculature was taken down in a subperiosteal fashion to expose L3-4 and L4-5 on the left. Intraoperative x-ray confirmed my level, and then I used a combination of the high-speed drill and the Kerrison punches to perform a hemilaminectomy, medial facetectomy, and foraminotomy at L3-4 and L4-5 on the left. The underlying yellow ligament was opened and removed in a piecemeal fashion to expose the underlying dura and exiting nerve root. I undercut the lateral recess and dissected down until I was medial to and  distal to the pedicle. The nerve root was well decompressed. We then gently retracted the nerve root medially with a retractor, coagulated the epidural venous vasculature, and inspected the disc space. There was a subannular disc protrusion at L4-5 on the left but I did not feel that performing an annulotomy and discectomy offered much benefit. I then palpated with a coronary dilator along the nerve root and into the foramen to assure adequate decompression. I felt no more compression of the nerve root. I drilled up under the spinous process and opposite lamina and performed a sublaminar decompression on the right. I then palpated with a coronary dilator. I irrigated with saline solution containing bacitracin. Achieved hemostasis with bipolar cautery, lined the dura with Gelfoam, and then closed the fascia with 0 Vicryl. I closed the subcutaneous tissues with 2-0 Vicryl and the subcuticular tissues with 3-0 Vicryl. The skin was then closed with benzoin and Steri-Strips. The drapes were removed, a sterile dressing was applied. The patient was awakened from general anesthesia and transferred to the recovery room in stable condition. At the end of the procedure all sponge, needle and instrument counts were correct.   PLAN OF CARE: Admit for overnight observation  PATIENT DISPOSITION:  PACU - hemodynamically stable.   Delay start of Pharmacological VTE agent (>24hrs) due to surgical blood loss or risk of bleeding:  yes

## 2016-03-19 NOTE — Anesthesia Postprocedure Evaluation (Signed)
Anesthesia Post Note  Patient: Andrea Perry  Procedure(s) Performed: Procedure(s) (LRB): Laminectomy and Foraminotomy - Lumbar three-four - Lumbar four-five (N/A)  Patient location during evaluation: PACU Anesthesia Type: General Level of consciousness: awake and alert Pain management: pain level controlled Vital Signs Assessment: post-procedure vital signs reviewed and stable Respiratory status: spontaneous breathing, nonlabored ventilation, respiratory function stable and patient connected to nasal cannula oxygen Cardiovascular status: blood pressure returned to baseline and stable Postop Assessment: no signs of nausea or vomiting Anesthetic complications: no    Last Vitals:  Vitals:   03/19/16 1125 03/19/16 1137  BP: (!) 124/91   Pulse: (!) 109   Resp: 13   Temp:  36.6 C    Last Pain:  Vitals:   03/19/16 1144  TempSrc:   PainSc: 3                  Josedaniel Haye,W. EDMOND

## 2016-03-19 NOTE — Transfer of Care (Signed)
Immediate Anesthesia Transfer of Care Note  Patient: Andrea Perry  Procedure(s) Performed: Procedure(s): Laminectomy and Foraminotomy - Lumbar three-four - Lumbar four-five (N/A)  Patient Location: PACU  Anesthesia Type:General  Level of Consciousness: awake, alert , oriented and patient cooperative  Airway & Oxygen Therapy: Patient Spontanous Breathing  Post-op Assessment: Report given to RN, Post -op Vital signs reviewed and stable and Patient moving all extremities X 4  Post vital signs: Reviewed and stable  Last Vitals:  Vitals:   03/19/16 0634 03/19/16 0956  BP: (!) 147/80 (!) 150/98  Pulse: 80 (!) 101  Resp: 20 12  Temp: 36.9 C 36.7 C    Last Pain:  Vitals:   03/19/16 0956  TempSrc:   PainSc: (P) 8       Patients Stated Pain Goal: 3 ([id number])  Complications: No apparent anesthesia complications

## 2016-03-19 NOTE — Anesthesia Preprocedure Evaluation (Addendum)
Anesthesia Evaluation  Patient identified by MRN, date of birth, ID band Patient awake    Reviewed: Allergy & Precautions, H&P , NPO status , Patient's Chart, lab work & pertinent test results  Airway Mallampati: II  TM Distance: <3 FB Neck ROM: Full    Dental no notable dental hx. (+) Teeth Intact, Dental Advisory Given   Pulmonary shortness of breath, asthma , former smoker,    Pulmonary exam normal breath sounds clear to auscultation       Cardiovascular hypertension, On Medications negative cardio ROS  + dysrhythmias Atrial Fibrillation  Rhythm:Regular Rate:Normal     Neuro/Psych negative neurological ROS  negative psych ROS   GI/Hepatic Neg liver ROS, hiatal hernia, GERD  Medicated and Controlled,  Endo/Other  negative endocrine ROSneg diabetesHypothyroidism Morbid obesity  Renal/GU negative Renal ROS  negative genitourinary   Musculoskeletal   Abdominal   Peds  Hematology negative hematology ROS (+)   Anesthesia Other Findings   Reproductive/Obstetrics negative OB ROS                            Anesthesia Physical Anesthesia Plan  ASA: III  Anesthesia Plan: General   Post-op Pain Management:    Induction: Intravenous  Airway Management Planned: Oral ETT  Additional Equipment:   Intra-op Plan:   Post-operative Plan: Extubation in OR  Informed Consent: I have reviewed the patients History and Physical, chart, labs and discussed the procedure including the risks, benefits and alternatives for the proposed anesthesia with the patient or authorized representative who has indicated his/her understanding and acceptance.   Dental advisory given  Plan Discussed with: CRNA  Anesthesia Plan Comments:         Anesthesia Quick Evaluation

## 2016-03-19 NOTE — OR Nursing (Signed)
Foley catheter removed per surgeon verbal order

## 2016-03-19 NOTE — H&P (Signed)
Subjective: Patient is a 65 y.o. female admitted for stenosis. Onset of symptoms was several months ago, gradually worsening since that time.  The pain is rated severe, and is located at the across the lower back and radiates to hips. The pain is described as aching and occurs intermittently. The symptoms have been progressive. Symptoms are exacerbated by exercise. MRI or CT showed stenosis   Past Medical History:  Diagnosis Date  . ALLERGIC RHINITIS 08/16/2008  . ANXIETY 08/16/2008  . Arthritis    hands, knees, lower back  . ASTHMA 08/16/2008  . Asthma    BRONCHITIS     DR. Lamonte Sakai    . Bladder leak   . Cancer ([place])    MELANOMA      . Chronic lower back pain    problems with disc L2-5  . COPD 08/16/2008  . DEPRESSION 08/16/2008  . Dysrhythmia    hx AF  . Excessive daytime sleepiness 06/19/2015  . GENITAL HERPES 12/03/2006  . GERD (gastroesophageal reflux disease)   . H/O hiatal hernia   . HEMATOCHEZIA 06/20/2009  . Hemorrhoids   . History of cardioversion 10/30/14  . History of kidney stones   . HYPERLIPIDEMIA 08/16/2008  . HYPERTENSION 12/03/2006  . Hypertension   . Impaired glucose tolerance 09/21/2010  . Overactive bladder 10/20/2015  . Pneumonia    as a child  . Pulmonary HTN 06/19/2015  . Shortness of breath dyspnea   . Sleep apnea    MILD NO CPAP ORDERED  . Snoring 06/19/2015    Past Surgical History:  Procedure Laterality Date  . ABDOMINAL HYSTERECTOMY    . APPENDECTOMY    . CARDIOVERSION N/A 03/20/2014   Procedure: CARDIOVERSION;  Surgeon: Josue Hector, MD;  Location: [facility] ENDOSCOPY;  Service: Cardiovascular;  Laterality: N/A;  . CARDIOVERSION N/A 10/30/2014   Procedure: CARDIOVERSION;  Surgeon: Josue Hector, MD;  Location: [facility] ENDOSCOPY;  Service: Cardiovascular;  Laterality: N/A;  . CARPAL TUNNEL RELEASE     Right + LEFT  . CESAREAN SECTION     x 1   . COLONOSCOPY  2004  . CYST EXCISION     RT ARM   . CYSTOCELE REPAIR N/A 10/25/2012   Procedure: ANTERIOR REPAIR (CYSTOCELE);   Surgeon: Gus Height, MD;  Location: [facility];  Service: Gynecology;  Laterality: N/A;  . HEEL SPUR SURGERY Bilateral   . KNEE SURGERY     Right + LEFT  . RADIOLOGY WITH ANESTHESIA Right 11/01/2014   Procedure: MRI RIGHT FOREARM;  Surgeon: Medication Radiologist, MD;  Location: [place];  Service: Radiology;  Laterality: Right;  . RADIOLOGY WITH ANESTHESIA Right 08/29/2015   Procedure: MRI - RIGHT FOREARM;  Surgeon: Medication Radiologist, MD;  Location: [place];  Service: Radiology;  Laterality: Right;  . RADIOLOGY WITH ANESTHESIA N/A 12/05/2015   Procedure: MRI SPINE WITHOUT;  Surgeon: Medication Radiologist, MD;  Location: [place];  Service: Radiology;  Laterality: N/A;  . SKIN CANCER EXCISION     BILAT SHOULDERS  . SPLIT NIGHT STUDY  09/04/2015  . TONSILLECTOMY AND ADENOIDECTOMY      Prior to Admission medications   Medication Sig Start Date End Date Taking? Authorizing Provider  albuterol (VENTOLIN HFA) 108 (90 Base) MCG/ACT inhaler Inhale 2 puffs into the lungs every 6 (six) hours as needed for wheezing or shortness of breath. Yearly physical due in June must see MD for refills 09/09/15  Yes Biagio Borg, MD  amLODipine (NORVASC) 10 MG tablet Take 1 tablet (10 mg  total) by mouth daily. 10/07/15  Yes Josue Hector, MD  Black Cohosh 20 MG TABS Take 40 mg by mouth daily.   Yes Historical Provider, MD  budesonide-formoterol (SYMBICORT) 160-4.5 MCG/ACT inhaler Inhale 1 puff into the lungs 2 (two) times daily. 12/24/15  Yes Collene Gobble, MD  cetirizine (ZYRTEC) 10 MG tablet Take 10 mg by mouth daily.    Yes Historical Provider, MD  Cholecalciferol (VITAMIN D) 2000 units tablet Take 2,000 Units by mouth daily.   Yes Historical Provider, MD  Cyanocobalamin (VITAMELTS ENERGY VITAMIN B-12) 1500 MCG TBDP Take 1,500 mcg by mouth daily.   Yes Historical Provider, MD  ELIQUIS 5 MG TABS tablet TAKE ONE TABLET BY MOUTH TWICE DAILY 12/03/15  Yes Josue Hector, MD  escitalopram (LEXAPRO) 20 MG tablet Take 1 tablet  (20 mg total) by mouth daily. Patient taking differently: Take 20 mg by mouth at bedtime.  11/18/15  Yes Biagio Borg, MD  estradiol (ESTRACE) 2 MG tablet TAKE ONE TABLET BY MOUTH ONCE DAILY Patient taking differently: TAKE ONE TABLET BY MOUTH ONCE AT BEDTIME 01/06/16  Yes Biagio Borg, MD  flecainide (TAMBOCOR) 50 MG tablet Take 1 tablet (50 mg total) by mouth 2 (two) times daily. 09/17/15  Yes Josue Hector, MD  fluorouracil (EFUDEX) 5 % cream Apply 1 application topically See admin instructions. Apply twice daily for 2 weeks for melanoma 11/06/15  Yes Historical Provider, MD  furosemide (LASIX) 40 MG tablet TAKE ONE TABLET BY MOUTH ONCE DAILY 02/27/16  Yes Biagio Borg, MD  Garlic 123XX123 MG CAPS Take 1,000 mg by mouth at bedtime.    Yes Historical Provider, MD  GuaiFENesin (MUCUS RELIEF ADULT PO) Take 1 tablet by mouth 2 (two) times daily.    Yes Historical Provider, MD  irbesartan (AVAPRO) 150 MG tablet TAKE ONE TABLET BY MOUTH ONCE DAILY 11/26/15  Yes Biagio Borg, MD  LORazepam (ATIVAN) 1 MG tablet TAKE ONE TABLET BY MOUTH AT BEDTIME AS NEEDED FOR ANXIETY 12/20/15  Yes Biagio Borg, MD  metoprolol (LOPRESSOR) 50 MG tablet Take 1 and 1/2 tabs by mouth twice per day Patient taking differently: Take 75 mg by mouth 2 (two) times daily.  10/07/15  Yes Josue Hector, MD  pantoprazole (PROTONIX) 40 MG tablet TAKE ONE TABLET BY MOUTH ONCE DAILY 07/25/14  Yes Biagio Borg, MD  potassium chloride SA (K-DUR,KLOR-CON) 20 MEQ tablet Take 1 tablet (20 mEq total) by mouth daily. 10/29/14  Yes Josue Hector, MD  pravastatin (PRAVACHOL) 40 MG tablet TAKE ONE TABLET BY MOUTH ONCE DAILY Patient taking differently: TAKE ONE TABLET BY MOUTH ONCE DAILY AT BEDTIME 09/06/15  Yes Biagio Borg, MD  Tetrahydrozoline HCl (VISINE OP) Place 1 drop into both eyes daily as needed (dry eyes).   Yes Historical Provider, MD  dimenhyDRINATE (DRAMAMINE) 50 MG tablet Take 50 mg by mouth daily as needed for nausea.    Historical Provider, MD   Omega-3 Fatty Acids (FISH OIL) 1200 MG CAPS Take 1,200 mg by mouth daily.     Historical Provider, MD  promethazine-codeine (PHENERGAN WITH CODEINE) 6.25-10 MG/5ML syrup Take 5 mLs by mouth every 4 (four) hours as needed for cough. Patient not taking: Reported on 02/10/2016 07/23/15   Biagio Borg, MD  Spacer/Aero-Holding Chambers (AEROCHAMBER MV) inhaler Use as instructed 10/10/15   Collene Gobble, MD  vitamin E 400 UNIT capsule Take 400 Units by mouth daily.    Historical Provider, MD  Allergies  Allergen Reactions  . Tramadol Hcl Nausea And Vomiting and Other (See Comments)     mouth dryness, headache  . Codeine Nausea And Vomiting  . Tape Rash    Plastic tape, bandaids and ekg leads, causes redness and rash  . Vicodin [Hydrocodone-Acetaminophen] Nausea And Vomiting    Social History  Substance Use Topics  . Smoking status: Former Smoker    Packs/day: 1.50    Years: 30.00    Types: Cigarettes    Quit date: 04/21/2003  . Smokeless tobacco: Never Used  . Alcohol use Yes     Comment: OCCAS    Family History  Problem Relation Age of Onset  . Diabetes Mother   . Hyperlipidemia Mother   . Hypertension Mother   . Colon polyps Mother   . Stroke Father   . Colon cancer Neg Hx      Review of Systems  Positive ROS: neg  All other systems have been reviewed and were otherwise negative with the exception of those mentioned in the HPI and as above.  Objective: Vital signs in last 24 hours: Temp:  [97.9 F (36.6 C)-98.4 F (36.9 C)] 98.4 F (36.9 C) (11/30 0634) Pulse Rate:  [57-80] 80 (11/30 0634) Resp:  [20] 20 (11/30 0634) BP: (117-147)/(77-80) 147/80 (11/30 0634) SpO2:  [95 %-96 %] 96 % (11/30 0634) Weight:  [120.7 kg (266 lb)-120.7 kg (266 lb 1.6 oz)] 120.7 kg (266 lb) (11/30 [id number])  General Appearance: Alert, cooperative, no distress, appears stated age Head: Normocephalic, without obvious abnormality, atraumatic Eyes: PERRL, conjunctiva/corneas clear, EOM'Perry intact     Neck: Supple, symmetrical, trachea midline Back: Symmetric, no curvature, ROM normal, no CVA tenderness Lungs:  respirations unlabored Heart: Regular rate and rhythm Abdomen: Soft, non-tender Extremities: Extremities normal, atraumatic, no cyanosis or edema Pulses: 2+ and symmetric all extremities Skin: Skin color, texture, turgor normal, no rashes or lesions  NEUROLOGIC:   Mental status: Alert and oriented x4,  no aphasia, good attention span, fund of knowledge, and memory Motor Exam - grossly normal Sensory Exam - grossly normal Reflexes: absent Coordination - grossly normal Gait - grossly normal Balance - grossly normal Cranial Nerves: I: smell Not tested  II: visual acuity  OS: nl    OD: nl  II: visual fields Full to confrontation  II: pupils Equal, round, reactive to light  III,VII: ptosis None  III,IV,VI: extraocular muscles  Full ROM  V: mastication Normal  V: facial light touch sensation  Normal  V,VII: corneal reflex  Present  VII: facial muscle function - upper  Normal  VII: facial muscle function - lower Normal  VIII: hearing Not tested  IX: soft palate elevation  Normal  IX,X: gag reflex Present  XI: trapezius strength  5/5  XI: sternocleidomastoid strength 5/5  XI: neck flexion strength  5/5  XII: tongue strength  Normal    Data Review Lab Results  Component Value Date   WBC 7.5 03/18/2016   HGB 13.1 03/18/2016   HCT 40.1 03/18/2016   MCV 95.5 03/18/2016   PLT 260 03/18/2016   Lab Results  Component Value Date   NA 143 03/18/2016   K 4.3 03/18/2016   CL 101 03/18/2016   CO2 29 03/18/2016   BUN 11 03/18/2016   CREATININE 0.73 03/18/2016   GLUCOSE 145 (H) 03/18/2016   Lab Results  Component Value Date   INR 1.00 03/18/2016    Assessment/Plan: Patient admitted for DLL L3-4, L4-5. Patient has failed a reasonable attempt  at conservative therapy.  I explained the condition and procedure to the patient and answered any questions.  Patient  wishes to proceed with procedure as planned. Understands risks/ benefits and typical outcomes of procedure.   Andrea Perry 03/19/2016 7:21 AM

## 2016-03-20 ENCOUNTER — Encounter (HOSPITAL_COMMUNITY): Payer: Self-pay | Admitting: Neurological Surgery

## 2016-03-20 DIAGNOSIS — M48061 Spinal stenosis, lumbar region without neurogenic claudication: Secondary | ICD-10-CM | POA: Diagnosis not present

## 2016-03-20 MED FILL — Thrombin For Soln 5000 Unit: CUTANEOUS | Qty: 5000 | Status: AC

## 2016-03-20 NOTE — Evaluation (Signed)
Physical Therapy Evaluation Patient Details Name: Andrea Perry MRN: [id number] DOB: 04-11-1951 Today's Date: 03/20/2016   History of Present Illness  Patient is a 65 yo female s/p  L3-4 L4-5 laminectomy   Clinical Impression  Patient demonstrates deficits in functional mobility as indicated below. Will need continued skilled PT to address deficits and maximize function. Will see as indicated and progress as tolerated.     Follow Up Recommendations Supervision/Assistance - 24 hour initially    Equipment Recommendations  None recommended by PT    Recommendations for Other Services       Precautions / Restrictions Precautions Precautions: Back Precaution Booklet Issued: Yes (comment) Precaution Comments: verbally reviewed with patient Restrictions Weight Bearing Restrictions: No      Mobility  Bed Mobility Overal bed mobility: Needs Assistance Bed Mobility: Rolling;Sidelying to Sit Rolling: Supervision Sidelying to sit: Supervision       General bed mobility comments: increased time and effort, supervision with cues for technique  Transfers Overall transfer level: Needs assistance Equipment used: None Transfers: Sit to/from Stand Sit to Stand: Min guard         General transfer comment: Min guard for safety. Good hand placement and technique.  Ambulation/Gait Ambulation/Gait assistance: Min assist Ambulation Distance (Feet): 90 Feet Assistive device: 1 person hand held assist Gait Pattern/deviations: Step-through pattern;Decreased stride length (increased lateral sway) Gait velocity: decreased Gait velocity interpretation: Below normal speed for age/gender General Gait Details: ocassional use of hand rail vs HHA  Stairs            Wheelchair Mobility    Modified Rankin (Stroke Patients Only)       Balance Overall balance assessment: Needs assistance Sitting-balance support: Feet supported;No upper extremity supported Sitting balance-Leahy  Scale: Good     Standing balance support: No upper extremity supported;During functional activity Standing balance-Leahy Scale: Fair                               Pertinent Vitals/Pain Pain Assessment: 0-10 Pain Score: 4  Pain Location: low back Pain Descriptors / Indicators: Operative site guarding Pain Intervention(s): Monitored during session    Home Living Family/patient expects to be discharged to:: Private residence Living Arrangements: Alone Available Help at Discharge: Family;Available PRN/intermittently Type of Home: House Home Access: Stairs to enter Entrance Stairs-Rails: Can reach both Entrance Stairs-Number of Steps: 3 Home Layout: One level Home Equipment: Shower seat;Bedside commode;Walker - 2 wheels;Cane - single point      Prior Function Level of Independence: Independent               Hand Dominance   Dominant Hand: Right    Extremity/Trunk Assessment   Upper Extremity Assessment: Overall WFL for tasks assessed           Lower Extremity Assessment: Generalized weakness      Cervical / Trunk Assessment:  (increased body habitus; s/p spinal surgery)  Communication   Communication: No difficulties  Cognition Arousal/Alertness: Awake/alert Behavior During Therapy: WFL for tasks assessed/performed Overall Cognitive Status: Within Functional Limits for tasks assessed                      General Comments      Exercises     Assessment/Plan    PT Assessment Patient needs continued PT services  PT Problem List Decreased strength;Decreased range of motion;Decreased activity tolerance;Decreased balance;Obesity;Pain;Decreased knowledge of use of DME;Decreased knowledge of precautions  PT Treatment Interventions DME instruction;Gait training;Stair training;Functional mobility training;Therapeutic activities;Therapeutic exercise;Balance training;Patient/family education    PT Goals (Current goals can be found  in the Care Plan section)  Acute Rehab PT Goals Patient Stated Goal: to go home sunday PT Goal Formulation: With patient Time For Goal Achievement: 04/03/16 Potential to Achieve Goals: Good    Frequency Min 5X/week   Barriers to discharge        Co-evaluation               End of Session   Activity Tolerance: Patient tolerated treatment well Patient left: in bed;with call bell/phone within reach (sitting EOB) Nurse Communication: Mobility status    Functional Assessment Tool Used: clincial judgement Functional Limitation: Mobility: Walking and moving around Mobility: Walking and Moving Around Current Status [id number]): At least 20 percent but less than 40 percent impaired, limited or restricted Mobility: Walking and Moving Around Goal Status [phone]): At least 1 percent but less than 20 percent impaired, limited or restricted    Time: 1553-1616 PT Time Calculation (min) (ACUTE ONLY): 23 min   Charges:   PT Evaluation $PT Eval Moderate Complexity: 1 Procedure     PT G Codes:   PT G-Codes **NOT FOR INPATIENT CLASS** Functional Assessment Tool Used: clincial judgement Functional Limitation: Mobility: Walking and moving around Mobility: Walking and Moving Around Current Status [id number]): At least 20 percent but less than 40 percent impaired, limited or restricted Mobility: Walking and Moving Around Goal Status [id number]): At least 1 percent but less than 20 percent impaired, limited or restricted    Duncan Dull 03/20/2016, 4:39 PM  Alben Deeds, [place] DPT  [phone]

## 2016-03-20 NOTE — Progress Notes (Signed)
Report called to RN. Pt transferred to 5M14 per MD order. Pt is stable @ transfer. Pt transported by NT. Holli Humbles, RN

## 2016-03-20 NOTE — Progress Notes (Signed)
Patient transferred from [place]. Patient  Alert and oriented x 4. Patient oriented to room and made comfortable.

## 2016-03-20 NOTE — Evaluation (Addendum)
Occupational Therapy Evaluation Patient Details Name: Andrea Perry MRN: [id number] DOB: 1950-07-20 Today's Date: 03/20/2016    History of Present Illness Patient is a 65 yo female s/p  L3-4 L4-5 laminectomy    Clinical Impression   Pt reports she was independent with ADL PTA. Currently pt overall min guard for functional mobility, supervision for UB ADL in sitting, and mod-max assist for LB ADL. Began back, safety, and ADL education with pt. Pt planning to d/c home alone with intermittent supervision from family. Pt would benefit from continued skilled OT to address established goals.    Follow Up Recommendations  No OT follow up;Supervision/Assistance - 24 hour (initially)    Equipment Recommendations  Other (comment) (Adaptive equipment)    Recommendations for Other Services       Precautions / Restrictions Precautions Precautions: Back Precaution Booklet Issued: Yes (comment) Precaution Comments: verbally reviewed with patient Restrictions Weight Bearing Restrictions: No      Mobility Bed Mobility Overal bed mobility: Needs Assistance Bed Mobility: Rolling;Sidelying to Sit Rolling: Supervision Sidelying to sit: Supervision       General bed mobility comments: increased time and effort, supervision with cues for technique  Transfers Overall transfer level: Needs assistance Equipment used: None Transfers: Sit to/from Stand Sit to Stand: Min guard         General transfer comment: Min guard for safety. Good hand placement and technique.    Balance Overall balance assessment: Needs assistance Sitting-balance support: Feet supported;No upper extremity supported Sitting balance-Leahy Scale: Good     Standing balance support: No upper extremity supported Standing balance-Leahy Scale: Fair                              ADL Overall ADL's : Needs assistance/impaired Eating/Feeding: Set up;Sitting   Grooming: Min guard;Standing;Wash/dry  hands Grooming Details (indicate cue type and reason): Educated on use of 2 cups for oral care. Upper Body Bathing: Set up;Supervision/ safety;Sitting   Lower Body Bathing: Moderate assistance;Sit to/from stand   Upper Body Dressing : Set up;Supervision/safety;Sitting   Lower Body Dressing: Maximal assistance;Sit to/from stand Lower Body Dressing Details (indicate cue type and reason): Pt unable to cross foot over opposite knee. Discussed use of AE for increased independence with LB ADL; needs further education, plan for next session.  Toilet Transfer: Min guard;Ambulation;Regular Toilet;Grab bars   Toileting- Clothing Manipulation and Hygiene: Min guard;Sit to/from stand Toileting - Clothing Manipulation Details (indicate cue type and reason): Cues for no bending during peri care     Functional mobility during ADLs: Min guard General ADL Comments: Educated pt on maintaining back precutions during functional activities, log roll technique for bed mobility.     Vision Vision Assessment?: No apparent visual deficits   Perception     Praxis      Pertinent Vitals/Pain Pain Assessment: 0-10 Pain Score: 4  Pain Location: low back Pain Descriptors / Indicators: Operative site guarding Pain Intervention(s): Monitored during session     Hand Dominance Right   Extremity/Trunk Assessment Upper Extremity Assessment Upper Extremity Assessment: Overall WFL for tasks assessed   Lower Extremity Assessment Lower Extremity Assessment: Defer to PT evaluation   Cervical / Trunk Assessment Cervical / Trunk Assessment:  (increased body habitus; s/p spinal surgery)   Communication Communication Communication: No difficulties   Cognition Arousal/Alertness: Awake/alert Behavior During Therapy: WFL for tasks assessed/performed Overall Cognitive Status: Within Functional Limits for tasks assessed  General Comments       Exercises       Shoulder  Instructions      Home Living Family/patient expects to be discharged to:: Private residence Living Arrangements: Alone Available Help at Discharge: Family;Available PRN/intermittently Type of Home: House Home Access: Stairs to enter [employer] of Steps: 3 Entrance Stairs-Rails: Can reach both Home Layout: One level     Bathroom Shower/Tub: Teacher, early years/pre: Standard     Home Equipment: Shower seat;Bedside commode;Walker - 2 wheels;Cane - single point          Prior Functioning/Environment Level of Independence: Independent                 OT Problem List: Decreased activity tolerance;Impaired balance (sitting and/or standing);Decreased knowledge of use of DME or AE;Decreased knowledge of precautions;Obesity;Pain   OT Treatment/Interventions: Self-care/ADL training;DME and/or AE instruction;Therapeutic activities;Balance training;Patient/family education    OT Goals(Current goals can be found in the care plan section) Acute Rehab OT Goals Patient Stated Goal: to go home sunday OT Goal Formulation: With patient Time For Goal Achievement: 04/03/16 Potential to Achieve Goals: Good ADL Goals Pt Will Perform Grooming: with supervision;standing Pt Will Perform Lower Body Bathing: with supervision;sit to/from stand;with adaptive equipment Pt Will Perform Lower Body Dressing: with supervision;with adaptive equipment;sit to/from stand Pt Will Transfer to Toilet: with supervision;ambulating;bedside commode Pt Will Perform Toileting - Clothing Manipulation and hygiene: with supervision;sit to/from stand Pt Will Perform Tub/Shower Transfer: Tub transfer;with supervision;shower seat;ambulating Additional ADL Goal #1: Pt will independently verbally recall 3/3 back precautions and maintain throughout ADL.  OT Frequency: Min 2X/week   Barriers to D/C: Decreased caregiver support  family only able to provide intermittent supervision        Co-evaluation              End of Session Nurse Communication: Mobility status  Activity Tolerance: Patient tolerated treatment well Patient left: in bed;with call bell/phone within reach (sitting EOB)   Time: [id number] OT Time Calculation (min): 19 min (dovetail with PT) Charges:  OT General Charges $OT Visit: 1 Procedure OT Evaluation $OT Eval Moderate Complexity: 1 Procedure G-Codes: OT G-codes **NOT FOR INPATIENT CLASS** Functional Assessment Tool Used: Clinical judgement Functional Limitation: Self care Self Care Current Status [id number]): At least 20 percent but less than 40 percent impaired, limited or restricted Self Care Goal Status [id number]): At least 1 percent but less than 20 percent impaired, limited or restricted   Binnie Kand M.S., OTR/L Pager: [phone]  03/20/2016, 4:54 PM

## 2016-03-20 NOTE — Progress Notes (Signed)
Patient ID: Andrea Perry, female   DOB: [birth year], 65 y.o.   MRN: [id number] Subjective: Patient reports significant back soreness limting mobility  Objective: Vital signs in last 24 hours: Temp:  [97.9 F (36.6 C)-98.6 F (37 C)] 98.6 F (37 C) (12/01 0742) Pulse Rate:  [72-109] 87 (12/01 0742) Resp:  [11-20] 20 (12/01 0742) BP: (119-134)/(73-91) 128/81 (12/01 0742) SpO2:  [95 %-100 %] 99 % (12/01 0742)  Intake/Output from previous day: 11/30 0701 - 12/01 0700 In: 2110 [P.O.:960; I.V.:1100; IV Piggyback:50] Out: 720 [Urine:90; Emesis/NG output:600; Blood:30] Intake/Output this shift: No intake/output data recorded.  Neurologic: Grossly normal  Lab Results: Lab Results  Component Value Date   WBC 7.5 03/18/2016   HGB 13.1 03/18/2016   HCT 40.1 03/18/2016   MCV 95.5 03/18/2016   PLT 260 03/18/2016   Lab Results  Component Value Date   INR 1.00 03/18/2016   BMET Lab Results  Component Value Date   NA 143 03/18/2016   K 4.3 03/18/2016   CL 101 03/18/2016   CO2 29 03/18/2016   GLUCOSE 145 (H) 03/18/2016   BUN 11 03/18/2016   CREATININE 0.73 03/18/2016   CALCIUM 8.7 (L) 03/18/2016    Studies/Results: Dg Lumbar Spine 2-3 Views  Result Date: 03/19/2016 CLINICAL DATA:  L3-4 L4-5 laminectomy EXAM: LUMBAR SPINE - 2-3 VIEW COMPARISON:  MR lumbar spine of 12/05/2015 FINDINGS: On cross-table lateral portable view from the operating room labeled 1, a needle is positioned beneath the spinous process of L2 directed toward the L 3 4 interspace. On film labeled 2, an instrument is directed beneath the spinous process of L3 toward the L3-4 interspace. IMPRESSION: Localization of L3-4 interspace on the second film Electronically Signed   By: Ivar Drape M.D.   On: 03/19/2016 09:30    Assessment/Plan: Transfer to floor, PT/OT, pain control   LOS: 0 days    Atlantis Delong S 03/20/2016, 10:46 AM

## 2016-03-21 DIAGNOSIS — M48061 Spinal stenosis, lumbar region without neurogenic claudication: Secondary | ICD-10-CM | POA: Diagnosis not present

## 2016-03-21 MED ORDER — POLYETHYLENE GLYCOL 3350 17 G PO PACK
17.0000 g | PACK | Freq: Every day | ORAL | Status: DC
  Administered 2016-03-21: 17 g via ORAL
  Filled 2016-03-21 (×2): qty 1

## 2016-03-21 MED ORDER — SENNOSIDES-DOCUSATE SODIUM 8.6-50 MG PO TABS
1.0000 | ORAL_TABLET | Freq: Two times a day (BID) | ORAL | Status: DC
  Administered 2016-03-21 (×2): 1 via ORAL
  Filled 2016-03-21 (×3): qty 1

## 2016-03-21 NOTE — Progress Notes (Signed)
Physical Therapy Treatment Patient Details Name: Andrea Perry MRN: [id number] DOB: 1950-12-02 Today's Date: 03/21/2016    History of Present Illness Patient is a 65 yo female s/p  L3-4 L4-5 laminectomy     PT Comments    Pt is making steady progress toward goals. Acute PT to continue during pt's hospital stay.  Follow Up Recommendations  Supervision/Assistance - 24 hour     Equipment Recommendations  None recommended by PT       Precautions / Restrictions Precautions Precautions: Back Precaution Comments: pt able to recall all back precautions without cues/assistance Restrictions Weight Bearing Restrictions: No    Mobility  Bed Mobility Overal bed mobility: Modified Independent Bed Mobility: Rolling;Sidelying to Sit Rolling: Modified independent (Device/Increase time) Sidelying to sit: Modified independent (Device/Increase time)       General bed mobility comments: HOB flat, no rails exits from R side of bed  Transfers Overall transfer level: Needs assistance Equipment used: None Transfers: Sit to/from Stand Sit to Stand: Supervision Stand pivot transfers: Supervision       General transfer comment: Min guard for safety. Increased time needed with pt demo'ing good hand placement and technique  Ambulation/Gait Ambulation/Gait assistance: Min guard;Min assist Ambulation Distance (Feet): 130 Feet Assistive device: None (hand support on hall rails at times with gait) Gait Pattern/deviations: Step-through pattern;Decreased stride length;Wide base of support (increased lateral sway with gait) Gait velocity: decreased Gait velocity interpretation: Below normal speed for age/gender General Gait Details: cues for posture and step length with gait       Cognition Arousal/Alertness: Awake/alert Behavior During Therapy: WFL for tasks assessed/performed Overall Cognitive Status: Within Functional Limits for tasks assessed                       Pertinent  Vitals/Pain Pain Assessment: 0-10 Pain Score: 5  Pain Location: back Pain Descriptors / Indicators: Aching;Sore Pain Intervention(s): Limited activity within patient's tolerance;Monitored during session;Premedicated before session;Repositioned     PT Goals (current goals can now be found in the care plan section) Acute Rehab PT Goals Patient Stated Goal: to go home sunday PT Goal Formulation: With patient Time For Goal Achievement: 04/03/16 Potential to Achieve Goals: Good Progress towards PT goals: Progressing toward goals    Frequency    Min 5X/week      PT Plan Current plan remains appropriate    End of Session Equipment Utilized During Treatment: Gait belt Activity Tolerance: Patient tolerated treatment well Patient left: in chair;with call bell/phone within reach;with nursing/sitter in room     Time: [id number] PT Time Calculation (min) (ACUTE ONLY): 26 min  Charges:  $Gait Training: 23-37 mins           Willow Ora 03/21/2016, 11:48 AM  Willow Ora, PTA, [phone] 03/21/16, 11:49 AM

## 2016-03-21 NOTE — Progress Notes (Signed)
Patient ID: Andrea Perry, female   DOB: 12-16-1950, 65 y.o.   MRN: [id number] Subjective: Patient reports better, back sore, no leg pain or NTW  Objective: Vital signs in last 24 hours: Temp:  [97.6 F (36.4 C)-98.7 F (37.1 C)] 97.7 F (36.5 C) (12/02 0550) Pulse Rate:  [76-95] 81 (12/02 0550) Resp:  [18-20] 18 (12/02 0550) BP: (100-130)/(60-91) 130/89 (12/02 0550) SpO2:  [95 %-99 %] 99 % (12/02 0550)  Intake/Output from previous day: 12/01 0701 - 12/02 0700 In: 840 [P.O.:840] Out: -  Intake/Output this shift: No intake/output data recorded.  Neurologic: Grossly normal  Lab Results: Lab Results  Component Value Date   WBC 7.5 03/18/2016   HGB 13.1 03/18/2016   HCT 40.1 03/18/2016   MCV 95.5 03/18/2016   PLT 260 03/18/2016   Lab Results  Component Value Date   INR 1.00 03/18/2016   BMET Lab Results  Component Value Date   NA 143 03/18/2016   K 4.3 03/18/2016   CL 101 03/18/2016   CO2 29 03/18/2016   GLUCOSE 145 (H) 03/18/2016   BUN 11 03/18/2016   CREATININE 0.73 03/18/2016   CALCIUM 8.7 (L) 03/18/2016    Studies/Results: Dg Lumbar Spine 2-3 Views  Result Date: 03/19/2016 CLINICAL DATA:  L3-4 L4-5 laminectomy EXAM: LUMBAR SPINE - 2-3 VIEW COMPARISON:  MR lumbar spine of 12/05/2015 FINDINGS: On cross-table lateral portable view from the operating room labeled 1, a needle is positioned beneath the spinous process of L2 directed toward the L 3 4 interspace. On film labeled 2, an instrument is directed beneath the spinous process of L3 toward the L3-4 interspace. IMPRESSION: Localization of L3-4 interspace on the second film Electronically Signed   By: Ivar Drape M.D.   On: 03/19/2016 09:30    Assessment/Plan: Getting better, still using IV therapies, expect home tomorrow   LOS: 0 days    Ruthetta Koopmann S 03/21/2016, 7:04 AM

## 2016-03-21 NOTE — Progress Notes (Signed)
Occupational Therapy Treatment Patient Details Name: Andrea Perry MRN: [id number] DOB: Feb 15, 1951 Today's Date: 03/21/2016    History of present illness Patient is a 65 yo female s/p  L3-4 L4-5 laminectomy    OT comments  Pt. Making gains with skilled OT and is clear for d/c from our standpoint.  Goals met, will alert OTR/L to sign off.    Follow Up Recommendations  No OT follow up;Supervision/Assistance - 24 hour    Equipment Recommendations  Other (comment)    Recommendations for Other Services      Precautions / Restrictions Precautions Precautions: Back Precaution Comments: verbally reviewed with patient       Mobility Bed Mobility Overal bed mobility: Modified Independent Bed Mobility: Rolling;Sidelying to Sit Rolling: Modified independent (Device/Increase time) Sidelying to sit: Modified independent (Device/Increase time)       General bed mobility comments: HOB flat, no rails exits from R side of bed  Transfers Overall transfer level: Needs assistance Equipment used: None Transfers: Sit to/from [facility] Sit to Stand: Supervision Stand pivot transfers: Supervision       General transfer comment: Min guard for safety. Good hand placement and technique.    Balance                                   ADL Overall ADL's : Needs assistance/impaired             Lower Body Bathing: Cueing for back precautions;With adaptive equipment;Cueing for sequencing;Cueing for compensatory techniques;Set up Lower Body Bathing Details (indicate cue type and reason): provided education and demonstration of use of LH sponge     Lower Body Dressing: Set up;Cueing for sequencing;Cueing for compensatory techniques;Cueing for back precautions;With adaptive equipment;Sitting/lateral leans Lower Body Dressing Details (indicate cue type and reason): don/doff socks while seated eob with use of sock aide, reviewed use of reacher for don/doff  pants Toilet Transfer: Supervision/safety;Regular Toilet   Toileting- Clothing Manipulation and Hygiene: Supervision/safety;Sit to/from stand   Tub/ Shower Transfer: Tub transfer;Min Chief Executive Officer Details (indicate cue type and reason): side step over tub for L faucet with use of grab bar, reports she has a shower chair also Functional mobility during ADLs: Supervision/safety General ADL Comments: moving well.  clear for d/c from OT. pt. has no questions or concerns      Vision                     Perception     Praxis      Cognition   Behavior During Therapy: WFL for tasks assessed/performed Overall Cognitive Status: Within Functional Limits for tasks assessed                       Extremity/Trunk Assessment               Exercises     Shoulder Instructions       General Comments      Pertinent Vitals/ Pain       Pain Assessment: 0-10 Pain Score: 4  Pain Location: back Pain Descriptors / Indicators: Aching Pain Intervention(s): Monitored during session;RN gave pain meds during session  Home Living  Prior Functioning/Environment              Frequency  Min 2X/week        Progress Toward Goals  OT Goals(current goals can now be found in the care plan section)  Progress towards OT goals: Goals met/education completed, patient discharged from [facility] Discharge plan remains appropriate    Co-evaluation                 End of Session Equipment Utilized During Treatment: Gait belt   Activity Tolerance Patient tolerated treatment well   Patient Left in chair;with call bell/phone within reach   Nurse Communication          Time: 1561-5379 OT Time Calculation (min): 18 min  Charges: OT General Charges $OT Visit: 1 Procedure OT Treatments $Self Care/Home Management : 8-22 mins  Janice Coffin, COTA/L 03/21/2016, 8:29  AM

## 2016-03-22 DIAGNOSIS — M48061 Spinal stenosis, lumbar region without neurogenic claudication: Secondary | ICD-10-CM | POA: Diagnosis not present

## 2016-03-22 MED ORDER — APIXABAN 5 MG PO TABS: 5.0000 mg | ORAL_TABLET | Freq: Two times a day (BID) | ORAL | 11 refills | Status: DC

## 2016-03-22 MED ORDER — WHITE PETROLATUM GEL
Status: AC
  Filled 2016-03-22: qty 1

## 2016-03-22 NOTE — Progress Notes (Signed)
Pt d/c to home by car with family. Assessment stable. All questions answered. 

## 2016-03-22 NOTE — Progress Notes (Signed)
No acute events Moving legs well Sensation intact Requests to stay one more day because family won't be back home until late tonight Stable Encourage ambulation Home tomorrow

## 2016-03-22 NOTE — Care Management Note (Signed)
Case Management Note  Patient Details  Name: Andrea Perry MRN: [id number] Date of Birth: 05-Sep-1950  Subjective/Objective:                  s/p  L3-4 L4-5 laminectomy Action/Plan: Discharge planning Expected Discharge Date:  03/22/16               Expected Discharge Plan:  Home/Self Care  In-House Referral:     Discharge planning Services  CM Consult  Post Acute Care Choice:    Choice offered to:  Patient  DME Arranged:  N/A DME Agency:  NA  HH Arranged:  NA HH Agency:  NA  Status of Service:  Completed, signed off  If discussed at [place] of Stay Meetings, dates discussed:    Additional Comments: CM met with pt in room to disposition.  Pt states she can get her brother to pick her up.  Pt has all DME needed at home and NO HH services recc. Or ordered.  No other CM needs were communicated. Dellie Catholic, RN 03/22/2016, 2:40 PM

## 2016-03-22 NOTE — Discharge Instructions (Signed)
Laminectomy, Care After This sheet gives you information about how to care for yourself after your procedure. Your health care provider may also give you more specific instructions. If you have problems or questions, contact your health care provider. What can I expect after the procedure? After the procedure, it is common to have:  Some pain around your incision area.  Muscle tightening (spasms) across the back. Follow these instructions at home: Incision care  Follow instructions from your health care provider about how to take care of your incision area. Make sure you:  Wash your hands with soap and water before and after you apply medicine to the area or change your bandage (dressing). If soap and water are not available, use hand sanitizer.  Change your dressing as told by your health care provider.  Leave stitches (sutures), skin glue, or adhesive strips in place. These skin closures may need to stay in place for 2 weeks or longer. If adhesive strip edges start to loosen and curl up, you may trim the loose edges. Do not remove adhesive strips completely unless your health care provider tells you to do that.  Check your incision area every day for signs of infection. Check for:  More redness, swelling, or pain.  More fluid or blood.  Warmth.  Pus or a bad smell. Medicines  Take over-the-counter and prescription medicines only as told by your health care provider.  If you were prescribed an antibiotic medicine, use it as told by your health care provider. Do not stop using the antibiotic even if you start to feel better. Bathing  Do not take baths, swim, or use a hot tub for 2 weeks, or until your incision has healed completely.  If your health care provider approves, you may take showers after your dressing has been removed. Activity  Return to your normal activities as told by your health care provider. Ask your health care provider what activities are safe for  you.  Avoid bending or twisting at your waist. Always bend at your knees.  Do not sit for more than 20-30 minutes at a time. Lie down or walk between periods of sitting.  Do not lift anything that is heavier than 10 lb (4.5 kg) or the limit that your health care provider tells you, until he or she says that it is safe.  Do not drive for 2 weeks after your procedure or for as long as your health care provider tells you.  Do not drive or use heavy machinery while taking prescription pain medicine. General instructions  To prevent or treat constipation while you are taking prescription pain medicine, your health care provider may recommend that you:  Drink enough fluid to keep your urine clear or pale yellow.  Take over-the-counter or prescription medicines.  Eat foods that are high in fiber, such as fresh fruits and vegetables, whole grains, and beans.  Limit foods that are high in fat and processed sugars, such as fried and sweet foods.  Do breathing exercises as told.  Keep all follow-up visits as told by your health care provider. This is important. Contact a health care provider if:  You have more redness, swelling, or pain around your incision area.  Your incision feels warm to the touch.  You are not able to return to activities or do exercises as told by your health care provider. Get help right away if:  You have:  More fluid or blood coming from your incision area.  Pus or a  bad smell coming from your incision area.  Chills or a fever.  Episodes of dizziness or fainting while standing.  You develop a rash.  You develop shortness of breath or you have difficulty breathing.  You cannot control when you urinate or have a bowel movement.  You become weak.  You are not able to use your legs. Summary  After the procedure, it is common to have some pain around your incision area. You may also have muscle tightening (spasms) across the back.  Follow  instructions from your health care provider about how to care for your incision.  Do not lift anything that is heavier than 10 lb (4.5 kg) or the limit that your health care provider tells you, until he or she says that it is safe.  Contact your health care provider if you have more redness, swelling, or pain around your incision area or if your incision feels warm to the touch. These can be signs of infection. This information is not intended to replace advice given to you by your health care provider. Make sure you discuss any questions you have with your health care provider. Document Released: 10/24/2004 Document Revised: 11/21/2015 Document Reviewed: 09/22/2015 Elsevier Interactive Patient Education  2017 [employer].

## 2016-03-22 NOTE — Discharge Summary (Signed)
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  They were discharged in stable condition.  Follow up: 3 weeks    Medication List    TAKE these medications   AEROCHAMBER MV inhaler Use as instructed   albuterol 108 (90 Base) MCG/ACT inhaler Commonly known as:  VENTOLIN HFA Inhale 2 puffs into the lungs every 6 (six) hours as needed for wheezing or shortness of breath. Yearly physical due in June must see MD for refills   amLODipine 10 MG tablet Commonly known as:  NORVASC Take 1 tablet (10 mg total) by mouth daily.   apixaban 5 MG Tabs tablet Commonly known as:  ELIQUIS Take 1 tablet (5 mg total) by mouth 2 (two) times daily. Start taking on:  03/27/2016 What changed:  See the new instructions.   Black Cohosh 20 MG Tabs Take 40 mg by mouth daily.   budesonide-formoterol 160-4.5 MCG/ACT inhaler Commonly known as:  SYMBICORT Inhale 1 puff into the lungs 2 (two) times daily.   cetirizine 10 MG tablet Commonly known as:  ZYRTEC Take 10 mg by mouth daily.   dimenhyDRINATE 50 MG tablet Commonly known as:  DRAMAMINE Take 50 mg by mouth daily as needed for nausea.   escitalopram 20 MG tablet Commonly known as:  LEXAPRO Take 1 tablet (20 mg total) by mouth daily. What changed:  when to take this   estradiol 2 MG tablet Commonly known as:  ESTRACE TAKE ONE TABLET BY MOUTH ONCE DAILY What changed:  See the new instructions.   Fish Oil 1200 MG Caps Take 1,200 mg by mouth daily.   flecainide 50 MG tablet Commonly known as:  TAMBOCOR Take 1 tablet (50 mg total) by mouth 2 (two) times daily.   fluorouracil 5 % cream Commonly known as:  EFUDEX Apply 1 application topically  See admin instructions. Apply twice daily for 2 weeks for melanoma   furosemide 40 MG tablet Commonly known as:  LASIX TAKE ONE TABLET BY MOUTH ONCE DAILY   Garlic 123XX123 MG Caps Take 1,000 mg by mouth at bedtime.   irbesartan 150 MG tablet Commonly known as:  AVAPRO TAKE ONE TABLET BY MOUTH ONCE DAILY   LORazepam 1 MG tablet Commonly known as:  ATIVAN TAKE ONE TABLET BY MOUTH AT BEDTIME AS NEEDED FOR ANXIETY   metoprolol 50 MG tablet Commonly known as:  LOPRESSOR Take 1 and 1/2 tabs by mouth twice per day What changed:  how much to take  how to take this  when to take this  additional instructions   MUCUS RELIEF ADULT PO Take 1 tablet by mouth 2 (two) times daily.   pantoprazole 40 MG tablet Commonly known as:  PROTONIX TAKE ONE TABLET BY MOUTH ONCE DAILY   potassium chloride SA 20 MEQ tablet Commonly known as:  K-DUR,KLOR-CON Take 1 tablet (20 mEq total) by mouth daily.   pravastatin 40 MG tablet Commonly known as:  PRAVACHOL TAKE ONE TABLET BY MOUTH ONCE DAILY What changed:  See the new instructions.   promethazine-codeine 6.25-10 MG/5ML syrup Commonly known as:  PHENERGAN with CODEINE Take 5 mLs by mouth every 4 (four) hours as needed for cough.   VISINE OP Place 1 drop into both eyes daily as needed (dry eyes).  VITAMELTS ENERGY VITAMIN B-12 1500 MCG Tbdp Generic drug:  Cyanocobalamin Take 1,500 mcg by mouth daily.   Vitamin D 2000 units tablet Take 2,000 Units by mouth daily.   vitamin E 400 UNIT capsule Take 400 Units by mouth daily.

## 2016-03-22 NOTE — Progress Notes (Signed)
Physical Therapy Treatment Patient Details Name: Andrea Perry MRN: [id number] DOB: [birth year] Today's Date: 03/22/2016    History of Present Illness Patient is a 65 yo female s/p  L3-4 L4-5 laminectomy     PT Comments    Pt presents with improved mobility on cusp of d/c home. Able to complete stairs, get in/out of bed, and use assistive device safely.  No concerns, will have help at d/c.  Goals met, d/c PT.  Follow Up Recommendations  Supervision/Assistance - 24 hour     Equipment Recommendations  None recommended by PT    Recommendations for Other Services       Precautions / Restrictions Precautions Precautions: Back Precaution Comments: pt able to recall all back precautions without cues/assistance    Mobility  Bed Mobility Overal bed mobility: Needs Assistance Bed Mobility: Sit to Sidelying         Sit to sidelying: Min assist General bed mobility comments: simulated home, needs assist for legs to bed surface, will have help at home  Transfers Overall transfer level: Modified independent Equipment used: Rolling walker (2 wheeled) Transfers: Sit to/from Stand Sit to Stand: Modified independent (Device/Increase time) Stand pivot transfers: Modified independent (Device/Increase time)       General transfer comment: improved safety and independence  Ambulation/Gait Ambulation/Gait assistance: Modified independent (Device/Increase time) Ambulation Distance (Feet): 150 Feet Assistive device: Rolling walker (2 wheeled) Gait Pattern/deviations: Step-through pattern;Wide base of support Gait velocity: decreased   General Gait Details: self-cues posture; DOE, standing rest 4 times   Stairs Stairs: Yes Stairs assistance: Min guard Stair Management: One rail Right;Step to pattern;Forwards;Backwards Number of Stairs: 3 General stair comments: effortful, but able, hands on cues and verbal cues for descend, pt goes backwards  Wheelchair Mobility    Modified  [place] (Stroke Patients Only)       Balance Overall balance assessment: No apparent balance deficits (not formally assessed) Sitting-balance support: No upper extremity supported;Feet supported Sitting balance-Leahy Scale: Good       Standing balance-Leahy Scale: Fair                      Cognition Arousal/Alertness: Awake/alert Behavior During Therapy: WFL for tasks assessed/performed Overall Cognitive Status: Within Functional Limits for tasks assessed                      Exercises      General Comments        Pertinent Vitals/Pain Pain Assessment: 0-10 Pain Score: 4  Pain Location: back Pain Intervention(s): Limited activity within patient's tolerance;Monitored during session;Patient requesting pain meds-RN notified    Home Living                      Prior Function            PT Goals (current goals can now be found in the care plan section) Acute Rehab PT Goals PT Goal Formulation: All assessment and education complete, DC therapy Progress towards PT goals: Goals met/education completed, patient discharged from PT    Frequency    Min 5X/week      PT Plan Current plan remains appropriate    Co-evaluation             End of Session   Activity Tolerance: Patient limited by fatigue;Patient tolerated treatment well Patient left: in bed;with nursing/sitter in room;with call bell/phone within reach     Time: 1510-1540 PT Time Calculation (min) (ACUTE ONLY):  30 min  Charges:  $Gait Training: 8-22 mins $Therapeutic Activity: 8-22 mins                    G Codes:  Functional Assessment Tool Used: clincial judgement Functional Limitation: Mobility: Walking and moving around Mobility: Walking and Moving Around Current Status [phone]): At least 1 percent but less than 20 percent impaired, limited or restricted Mobility: Walking and Moving Around Goal Status [phone]): At least 1 percent but less than 20 percent impaired,  limited or restricted Mobility: Walking and Moving Around Discharge Status [phone]): At least 1 percent but less than 20 percent impaired, limited or restricted   Herbie Drape 03/22/2016, 3:48 PM

## 2016-03-25 ENCOUNTER — Telehealth: Payer: Self-pay | Admitting: Internal Medicine

## 2016-03-25 MED ORDER — AZITHROMYCIN 250 MG PO TABS: ORAL_TABLET | ORAL | 0 refills | Status: DC

## 2016-03-25 NOTE — Telephone Encounter (Signed)
Patient called in advising that she just got home from the surgery on her back. She is having a flare up of her copd/ chronic bronchitis. Green phlegm and productive sputum. She is asking if we can call something in for her, because she is currently unable to make an appt with her condition.

## 2016-03-25 NOTE — Telephone Encounter (Signed)
Done  - zpack  - erx

## 2016-03-26 ENCOUNTER — Other Ambulatory Visit: Payer: Self-pay | Admitting: Internal Medicine

## 2016-04-16 ENCOUNTER — Ambulatory Visit (INDEPENDENT_AMBULATORY_CARE_PROVIDER_SITE_OTHER): Payer: Medicare Other | Admitting: Internal Medicine

## 2016-04-16 ENCOUNTER — Encounter: Payer: Self-pay | Admitting: Internal Medicine

## 2016-04-16 ENCOUNTER — Telehealth: Payer: Self-pay | Admitting: Emergency Medicine

## 2016-04-16 VITALS — BP 110/60 | HR 126 | Temp 97.8°F | Resp 22 | Ht 62.0 in | Wt 274.4 lb

## 2016-04-16 DIAGNOSIS — E119 Type 2 diabetes mellitus without complications: Secondary | ICD-10-CM | POA: Diagnosis not present

## 2016-04-16 DIAGNOSIS — J019 Acute sinusitis, unspecified: Secondary | ICD-10-CM

## 2016-04-16 DIAGNOSIS — I10 Essential (primary) hypertension: Secondary | ICD-10-CM

## 2016-04-16 DIAGNOSIS — Z0001 Encounter for general adult medical examination with abnormal findings: Secondary | ICD-10-CM

## 2016-04-16 DIAGNOSIS — E785 Hyperlipidemia, unspecified: Secondary | ICD-10-CM

## 2016-04-16 DIAGNOSIS — Z23 Encounter for immunization: Secondary | ICD-10-CM

## 2016-04-16 LAB — POCT GLYCOSYLATED HEMOGLOBIN (HGB A1C): HEMOGLOBIN A1C: 6.2

## 2016-04-16 MED ORDER — LEVOFLOXACIN 250 MG PO TABS: 250.0000 mg | ORAL_TABLET | Freq: Every day | ORAL | 0 refills | Status: DC

## 2016-04-16 NOTE — Assessment & Plan Note (Signed)
stable overall by history and exam, recent data reviewed with pt, and pt to continue medical treatment as before,  to f/u any worsening symptoms or concerns Lab Results  Component Value Date   HGBA1C 6.2 10/16/2015

## 2016-04-16 NOTE — Assessment & Plan Note (Signed)
Mild elevated last visit, unusual for her, likely diet related, declnies f/u lab today, will check next visit, goal ldl < 70

## 2016-04-16 NOTE — Patient Instructions (Addendum)
You had the flu shot today  Your A1c was OK today at 6.2  Please continue all other medications as before, and refills have been done if requested.  Please have the pharmacy call with any other refills you may need.  Please continue your efforts at being more active, low cholesterol diet, and weight control.  Please keep your appointments with your specialists as you may have planned  Please return in 6 months, or sooner if needed, with Lab testing done 3-5 days before

## 2016-04-16 NOTE — Telephone Encounter (Signed)
[facility] called and stated they have a drug interaction and wants you to call them back. Please advise thanks.

## 2016-04-16 NOTE — Progress Notes (Signed)
Subjective:    Patient ID: Andrea Perry, female    DOB: [birth year], 65 y.o.   MRN: [id number]  HPI  Here to f/u; overall doing ok,  Pt denies chest pain, increasing sob or doe, wheezing, orthopnea, PND, increased LE swelling, palpitations, dizziness or syncope.  Pt denies new neurological symptoms such as new headache, or facial or extremity weakness or numbness.  Pt denies polydipsia, polyuria, or low sugar episode.   Pt denies new neurological symptoms such as new headache, or facial or extremity weakness or numbness.   Pt states overall good compliance with meds, mostly trying to follow appropriate diet, with wt overall stable,  but little exercise however.  Just recently had lumbar disc surgury, already feels overall improved, can move and stand better than prior to surgury, Walking with walker, no falls. Due for flu shot incidentally -  Here with 6-7 days acute onset fever, facial pain, pressure, headache, general weakness and malaise, and greenish d/c, with mild ST and cough,  Past Medical History:  Diagnosis Date  . ALLERGIC RHINITIS 08/16/2008  . ANXIETY 08/16/2008  . Arthritis    hands, knees, lower back  . ASTHMA 08/16/2008  . Asthma    BRONCHITIS     DR. Lamonte Sakai    . Bladder leak   . Cancer ([place])    MELANOMA      . Chronic lower back pain    problems with disc L2-5  . COPD 08/16/2008  . DEPRESSION 08/16/2008  . Dysrhythmia    hx AF  . Excessive daytime sleepiness 06/19/2015  . GENITAL HERPES 12/03/2006  . GERD (gastroesophageal reflux disease)   . H/O hiatal hernia   . HEMATOCHEZIA 06/20/2009  . Hemorrhoids   . History of cardioversion 10/30/14  . History of kidney stones   . HYPERLIPIDEMIA 08/16/2008  . HYPERTENSION 12/03/2006  . Hypertension   . Impaired glucose tolerance 09/21/2010  . Overactive bladder 10/20/2015  . Pneumonia    as a child  . Pulmonary HTN 06/19/2015  . Shortness of breath dyspnea   . Sleep apnea    MILD NO CPAP ORDERED  . Snoring 06/19/2015   Past Surgical  History:  Procedure Laterality Date  . ABDOMINAL HYSTERECTOMY    . APPENDECTOMY    . CARDIOVERSION N/A 03/20/2014   Procedure: CARDIOVERSION;  Surgeon: Josue Hector, MD;  Location: [facility] ENDOSCOPY;  Service: Cardiovascular;  Laterality: N/A;  . CARDIOVERSION N/A 10/30/2014   Procedure: CARDIOVERSION;  Surgeon: Josue Hector, MD;  Location: [facility] ENDOSCOPY;  Service: Cardiovascular;  Laterality: N/A;  . CARPAL TUNNEL RELEASE     Right + LEFT  . CESAREAN SECTION     x 1   . COLONOSCOPY  2004  . CYST EXCISION     RT ARM   . CYSTOCELE REPAIR N/A 10/25/2012   Procedure: ANTERIOR REPAIR (CYSTOCELE);  Surgeon: Gus Height, MD;  Location: [facility];  Service: Gynecology;  Laterality: N/A;  . HEEL SPUR SURGERY Bilateral   . KNEE SURGERY     Right + LEFT  . LUMBAR LAMINECTOMY/DECOMPRESSION MICRODISCECTOMY N/A 03/19/2016   Procedure: Laminectomy and Foraminotomy - Lumbar three-four - Lumbar four-five;  Surgeon: Eustace Moore, MD;  Location: [place];  Service: Neurosurgery;  Laterality: N/A;  . RADIOLOGY WITH ANESTHESIA Right 11/01/2014   Procedure: MRI RIGHT FOREARM;  Surgeon: Medication Radiologist, MD;  Location: [place];  Service: Radiology;  Laterality: Right;  . RADIOLOGY WITH ANESTHESIA Right 08/29/2015   Procedure: MRI - RIGHT FOREARM;  Surgeon: Medication Radiologist, MD;  Location: [place];  Service: Radiology;  Laterality: Right;  . RADIOLOGY WITH ANESTHESIA N/A 12/05/2015   Procedure: MRI SPINE WITHOUT;  Surgeon: Medication Radiologist, MD;  Location: [employer];  Service: Radiology;  Laterality: N/A;  . SKIN CANCER EXCISION     BILAT SHOULDERS  . SPLIT NIGHT STUDY  09/04/2015  . TONSILLECTOMY AND ADENOIDECTOMY      reports that she quit smoking about 12 years ago. Her smoking use included Cigarettes. She has a 45.00 pack-year smoking history. She has never used smokeless tobacco. She reports that she drinks alcohol. She reports that she does not use drugs. family history includes Colon polyps in her mother;  Diabetes in her mother; Hyperlipidemia in her mother; Hypertension in her mother; Stroke in her father. Allergies  Allergen Reactions  . Tramadol Hcl Nausea And Vomiting and Other (See Comments)     mouth dryness, headache  . Codeine Nausea And Vomiting  . Tape Rash    Plastic tape, bandaids and ekg leads, causes redness and rash  . Vicodin [Hydrocodone-Acetaminophen] Nausea And Vomiting   Current Outpatient Prescriptions on File Prior to Visit  Medication Sig Dispense Refill  . albuterol (VENTOLIN HFA) 108 (90 Base) MCG/ACT inhaler Inhale 2 puffs into the lungs every 6 (six) hours as needed for wheezing or shortness of breath. Yearly physical due in June must see MD for refills 54 each 0  . amLODipine (NORVASC) 10 MG tablet Take 1 tablet (10 mg total) by mouth daily. 90 tablet 3  . apixaban (ELIQUIS) 5 MG TABS tablet Take 1 tablet (5 mg total) by mouth 2 (two) times daily. 60 tablet 11  . Black Cohosh 20 MG TABS Take 40 mg by mouth daily.    . budesonide-formoterol (SYMBICORT) 160-4.5 MCG/ACT inhaler Inhale 1 puff into the lungs 2 (two) times daily. 1 Inhaler 5  . cetirizine (ZYRTEC) 10 MG tablet Take 10 mg by mouth daily.     . Cholecalciferol (VITAMIN D) 2000 units tablet Take 2,000 Units by mouth daily.    . Cyanocobalamin (VITAMELTS ENERGY VITAMIN B-12) 1500 MCG TBDP Take 1,500 mcg by mouth daily.    Marland Kitchen dimenhyDRINATE (DRAMAMINE) 50 MG tablet Take 50 mg by mouth daily as needed for nausea.    Marland Kitchen escitalopram (LEXAPRO) 20 MG tablet Take 1 tablet (20 mg total) by mouth daily. (Patient taking differently: Take 20 mg by mouth at bedtime. ) 90 tablet 3  . estradiol (ESTRACE) 2 MG tablet TAKE ONE TABLET BY MOUTH ONCE DAILY (Patient taking differently: TAKE ONE TABLET BY MOUTH ONCE AT BEDTIME) 90 tablet 0  . flecainide (TAMBOCOR) 50 MG tablet Take 1 tablet (50 mg total) by mouth 2 (two) times daily. 180 tablet 3  . fluorouracil (EFUDEX) 5 % cream Apply 1 application topically See admin  instructions. Apply twice daily for 2 weeks for melanoma  0  . furosemide (LASIX) 40 MG tablet TAKE ONE TABLET BY MOUTH ONCE DAILY 90 tablet 0  . Garlic 123XX123 MG CAPS Take 1,000 mg by mouth at bedtime.     . GuaiFENesin (MUCUS RELIEF ADULT PO) Take 1 tablet by mouth 2 (two) times daily.     . irbesartan (AVAPRO) 150 MG tablet TAKE ONE TABLET BY MOUTH ONCE DAILY 90 tablet 3  . LORazepam (ATIVAN) 1 MG tablet TAKE ONE TABLET BY MOUTH AT BEDTIME AS NEEDED FOR ANXIETY 30 tablet 3  . metoprolol (LOPRESSOR) 50 MG tablet Take 1 and 1/2 tabs by mouth twice  per day (Patient taking differently: Take 75 mg by mouth 2 (two) times daily. ) 270 tablet 3  . Omega-3 Fatty Acids (FISH OIL) 1200 MG CAPS Take 1,200 mg by mouth daily.     . pantoprazole (PROTONIX) 40 MG tablet TAKE ONE TABLET BY MOUTH ONCE DAILY 90 tablet 3  . potassium chloride SA (K-DUR,KLOR-CON) 20 MEQ tablet Take 1 tablet (20 mEq total) by mouth daily. 90 tablet 3  . pravastatin (PRAVACHOL) 40 MG tablet TAKE ONE TABLET BY MOUTH ONCE DAILY (Patient taking differently: TAKE ONE TABLET BY MOUTH ONCE DAILY AT BEDTIME) 90 tablet 0  . Spacer/Aero-Holding Chambers (AEROCHAMBER MV) inhaler Use as instructed 1 each 0  . Tetrahydrozoline HCl (VISINE OP) Place 1 drop into both eyes daily as needed (dry eyes).    . vitamin E 400 UNIT capsule Take 400 Units by mouth daily.    Marland Kitchen azithromycin (ZITHROMAX Z-PAK) 250 MG tablet 2 tab by mouth day 1, then 1 per day (Patient not taking: Reported on 04/16/2016) 6 tablet 0  . pantoprazole (PROTONIX) 40 MG tablet TAKE ONE TABLET BY MOUTH ONCE DAILY.  YEARLY  PHYSICAL  DUE  IN  JUNE  MUST  SEE  MD  FOR  REFILLS. (Patient not taking: Reported on 04/16/2016) 90 tablet 0  . promethazine-codeine (PHENERGAN WITH CODEINE) 6.25-10 MG/5ML syrup Take 5 mLs by mouth every 4 (four) hours as needed for cough. (Patient not taking: Reported on 04/16/2016) 180 mL 0   No current facility-administered medications on file prior to visit.     Review of Systems  Constitutional: Negative for unusual diaphoresis or night sweats HENT: Negative for ear swelling or discharge Eyes: Negative for worsening visual haziness  Respiratory: Negative for choking and stridor.   Gastrointestinal: Negative for distension or worsening eructation Genitourinary: Negative for retention or change in urine volume.  Musculoskeletal: Negative for other MSK pain or swelling Skin: Negative for color change and worsening wound Neurological: Negative for tremors and numbness other than noted  Psychiatric/Behavioral: Negative for decreased concentration or agitation other than above   All other system neg per pt    Objective:   Physical Exam BP 110/60   Pulse (!) 126   Temp 97.8 F (36.6 C) (Oral)   Resp (!) 22   Ht 5\' 2"  (1.575 m)   Wt 274 lb 6.4 oz (124.5 kg)   SpO2 98%   BMI 50.19 kg/m  VS noted, mild ill, moving slowly to stand but able to be up on exam table with minor assist only Constitutional: Pt appears in no apparent distress HENT: Head: NCAT.  Right Ear: External ear normal.  Left Ear: External ear normal.  Eyes: . Pupils are equal, round, and reactive to light. Conjunctivae and EOM are normal Bilat tm's with mild erythema.  Max sinus areas mild tender.  Pharynx with mild erythema, no exudate Neck: Normal range of motion. Neck supple.  Cardiovascular: Normal rate and regular rhythm.   Pulmonary/Chest: Effort normal and breath sounds without rales or wheezing.  Mild lowest lumbar midline tender Neurological: Pt is alert. Not confused , motor grossly intact Skin: Skin is warm. No rash, no LE edema Psychiatric: Pt behavior is normal. No agitation.  No other new exam findings  Lab Results  Component Value Date   WBC 7.5 03/18/2016   HGB 13.1 03/18/2016   HCT 40.1 03/18/2016   PLT 260 03/18/2016   GLUCOSE 145 (H) 03/18/2016   CHOL 219 (H) 10/16/2015   TRIG 275.0 (H)  10/16/2015   HDL 41.20 10/16/2015   LDLDIRECT 141.0  10/16/2015   LDLCALC 50 04/04/2014   ALT 24 10/16/2015   AST 30 10/16/2015   NA 143 03/18/2016   K 4.3 03/18/2016   CL 101 03/18/2016   CREATININE 0.73 03/18/2016   BUN 11 03/18/2016   CO2 29 03/18/2016   TSH 5.08 (H) 10/16/2015   INR 1.00 03/18/2016   HGBA1C 6.2 10/16/2015   MICROALBUR 1.9 10/16/2015   Pt declines lab today except: POCT - a1c- 6.2    Assessment & Plan:

## 2016-04-16 NOTE — Assessment & Plan Note (Signed)
stable overall by history and exam, recent data reviewed with pt, and pt to continue medical treatment as before,  to f/u any worsening symptoms or concerns BP Readings from Last 3 Encounters:  04/16/16 110/60  03/22/16 112/71  03/18/16 117/77

## 2016-04-16 NOTE — Progress Notes (Signed)
Pre visit review using our clinic review tool, if applicable. No additional management support is needed unless otherwise documented below in the visit note. 

## 2016-04-17 MED ORDER — CEPHALEXIN 500 MG PO CAPS: 500.0000 mg | ORAL_CAPSULE | Freq: Four times a day (QID) | ORAL | 0 refills | Status: AC

## 2016-04-17 NOTE — Telephone Encounter (Signed)
[place] for change levaquin to cephalexin - done erx

## 2016-04-17 NOTE — Telephone Encounter (Signed)
Pt in to see if there was another med sent in ?

## 2016-04-21 NOTE — Telephone Encounter (Signed)
Patient already picked up medication and has started taking it.

## 2016-05-04 DIAGNOSIS — H2513 Age-related nuclear cataract, bilateral: Secondary | ICD-10-CM | POA: Diagnosis not present

## 2016-05-10 ENCOUNTER — Other Ambulatory Visit: Payer: Self-pay | Admitting: Internal Medicine

## 2016-05-11 NOTE — Telephone Encounter (Signed)
Routing to dr john, please advise, thanks 

## 2016-05-12 NOTE — Telephone Encounter (Signed)
faxed

## 2016-05-12 NOTE — Telephone Encounter (Signed)
Done hardcopy to Corinne  

## 2016-05-23 ENCOUNTER — Other Ambulatory Visit: Payer: Self-pay | Admitting: Internal Medicine

## 2016-05-26 DIAGNOSIS — S21209A Unspecified open wound of unspecified back wall of thorax without penetration into thoracic cavity, initial encounter: Secondary | ICD-10-CM | POA: Insufficient documentation

## 2016-06-02 DIAGNOSIS — S21209A Unspecified open wound of unspecified back wall of thorax without penetration into thoracic cavity, initial encounter: Secondary | ICD-10-CM | POA: Diagnosis not present

## 2016-06-09 DIAGNOSIS — I4891 Unspecified atrial fibrillation: Secondary | ICD-10-CM | POA: Diagnosis not present

## 2016-06-09 DIAGNOSIS — Z7901 Long term (current) use of anticoagulants: Secondary | ICD-10-CM | POA: Diagnosis not present

## 2016-06-09 DIAGNOSIS — Z09 Encounter for follow-up examination after completed treatment for conditions other than malignant neoplasm: Secondary | ICD-10-CM | POA: Diagnosis not present

## 2016-06-24 ENCOUNTER — Ambulatory Visit (INDEPENDENT_AMBULATORY_CARE_PROVIDER_SITE_OTHER): Payer: Medicare Other | Admitting: Emergency Medicine

## 2016-06-24 ENCOUNTER — Encounter: Payer: Self-pay | Admitting: Emergency Medicine

## 2016-06-24 DIAGNOSIS — J301 Allergic rhinitis due to pollen: Secondary | ICD-10-CM

## 2016-06-24 DIAGNOSIS — G4733 Obstructive sleep apnea (adult) (pediatric): Secondary | ICD-10-CM

## 2016-06-24 DIAGNOSIS — J438 Other emphysema: Secondary | ICD-10-CM | POA: Diagnosis not present

## 2016-06-24 MED ORDER — UMECLIDINIUM-VILANTEROL 62.5-25 MCG/INH IN AEPB: 1.0000 | INHALATION_SPRAY | Freq: Every day | RESPIRATORY_TRACT | 0 refills | Status: DC

## 2016-06-24 NOTE — Progress Notes (Signed)
Subjective:    Patient ID: Andrea Perry, female    DOB: 05/23/1950, 66 y.o.   MRN: [id number]  HPI 66 yo woman, hx of childhood asthma, hx tobacco (45 pk-yrs), allergies, GERD, carries a dx of COPD and adult asthma made by Dr Jenny Reichmann. She has worked in a [employer] and had to have spirometry as a part of her job. She is referred today for her COPD and also for pre-op eval for L knee surgery w Dr Noemi Chapel. She describes some exertional dyspnea, happens after 64ft. She has some occasional cough, non-productive. She sometimes hears wheeze. She has had episodes of light-headedness when walking an extended distance.   ROV 02/14/14 -- follow-up visit for COPD and history of childhood asthma. She was also recently diagnosed with atrial fibrillation - on eliquis and metoprolol. Last visit we added Spiriva to her Advair to see if she will benefit. She believed that she benefited from the [place], but note that she was having work done on her A fib and rate control at the same time.   ROV 08/28/14 -- Follow-up visit for COPD and a history of asthma. She described variably in her breathing, good days and bad days. She has gained about 10 lbs. She has exertional SOB, especially w housework but not every time. She is on Advair bid, ventolin prn > averages 3x a week. She doesn't seem to miss spiriva. She has occasional coughing. She remains on zyrtec and omeprazole   ROV 03/08/15 -- follow-up visit for asthmatic COPD in the setting of prior tobacco use, cotton dust exposure and also childhood asthma. She is currently managed on Advair 500/50 twice a day, albuterol when necessary which she uses about 3-4x a weeks. She's also been seen by Dr. Johnsie Cancel for hypertension and atrial fibrillation, cardioverted in July. She has stayed in NSR on flecainide. She still has some exertional dyspnea on some days, not all. Notices with chores. She has a new cough that started  A few months ago - note avapro added at that time. She is  sneezing some, on zyrtec.   ROV 10/10/15 -- patient with a history of COPD with asthmatic features, history of tobacco use and cotton dust exposure. She had acute exacerbation with bronchitis in January 2017 which was treated in our office. 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 She continues to have exertional dyspnea, has to stop to rest w walking sometimes.  She is having some throat irritation, cough has been a bit worse since increasing her avapro. She has some nasal congestion, throat clearing. No significant wheeze, chest tightness. She is tolerating Advair w some UA irritation.   ROV 12/10/15 -- Follow-up visit for patient with a history of COPD and asthmatic features, cotton dust exposure. She has some degree of upper airway irritation syndrome as well. We tried changing her Advair to Symbicort to see if this would help with her upper airway irritation. She is also on Zyrtec. She feels that it is helped her breathing more than the Advair. Using a spacer. Throat is less irritated. The co-pay is high right now.   ROV 06/24/16 -- patient has a history of COPD with positive bronchodilator response, cotton dust exposure. Also with mild obstructive sleep apnea, not on CPAP. She reports that she has been having exertional dyspnea. She switched back to Advair temporarily due to cost. She is having some wheeze. Occasional  dry cough. She is on zyrtec. Uses albuterol several times a week.    Review of Systems  Constitutional: Negative for fever and unexpected weight change.  HENT: Negative for congestion, dental problem, ear pain, nosebleeds, postnasal drip, rhinorrhea, sinus pressure, sneezing, sore throat and trouble swallowing.   Eyes: Negative for redness and itching.  Respiratory: Positive for cough and shortness of breath. Negative for chest tightness and wheezing.   Cardiovascular: Negative for palpitations  and leg swelling.  Gastrointestinal: Negative for nausea and vomiting.  Genitourinary: Negative for dysuria.  Musculoskeletal: Negative for joint swelling.  Skin: Negative for rash.  Neurological: Negative for headaches.  Hematological: Does not bruise/bleed easily.  Psychiatric/Behavioral: Negative for dysphoric mood. The patient is not nervous/anxious.        Objective:   Physical Exam Vitals:   06/24/16 1341  BP: 104/78  BP Location: Right Arm  Cuff Size: Large  Pulse: (!) 103  SpO2: 96%  Weight: 270 lb 3.2 oz (122.6 kg)  Height: 5\' 2"  (1.575 m)   Gen: Pleasant, well-nourished, in no distress,  normal affect  ENT: No lesions,  mouth clear,  oropharynx clear, no postnasal drip  Neck: No JVD, no TMG, no carotid bruits  Lungs: No use of accessory muscles, no dullness to percussion, clear without rales or rhonchi  Cardiovascular: RRR, heart sounds normal, no murmur or gallops, no peripheral edema  Musculoskeletal: No deformities, no cyanosis or clubbing  Neuro: alert, non focal  Skin: Warm, no lesions or rashes      Assessment & Plan:  Allergic rhinitis Continue your zyrtec once a day.  Try starting OTC Flonase or nasacort to see if this helps with allergies.   COPD (chronic obstructive pulmonary disease) Stop Symbicort (Advair) for now.  We will try starting Anoro once a day. Call our ofice to let us know if you have benefited. If so we will send a prescription to your pharmacy.  Take albuterol 2 puffs up to every 4 hours if needed for shortness of breath.   Mild obstructive sleep apnea No CPAP. Discussed wt loss   Baltazar Apo, MD, PhD 06/24/2016, 2:07 PM [facility] [phone] or if no answer [phone]

## 2016-06-24 NOTE — Assessment & Plan Note (Signed)
Stop Symbicort (Advair) for now.  We will try starting Anoro once a day. Call our ofice to let us know if you have benefited. If so we will send a prescription to your pharmacy.  Take albuterol 2 puffs up to every 4 hours if needed for shortness of breath.

## 2016-06-24 NOTE — Progress Notes (Signed)
Patient seen in the office today and instructed on use of Anoro.  Patient expressed understanding and demonstrated technique.TA/CMA 06/24/2016

## 2016-06-24 NOTE — Assessment & Plan Note (Signed)
No CPAP. Discussed wt loss

## 2016-06-24 NOTE — Assessment & Plan Note (Signed)
Continue your zyrtec once a day.  Try starting OTC Flonase or nasacort to see if this helps with allergies.

## 2016-06-24 NOTE — Addendum Note (Signed)
Addended by: Benson Setting L on: 06/24/2016 02:13 PM   Modules accepted: Orders

## 2016-06-24 NOTE — Patient Instructions (Addendum)
Stop Symbicort (Advair) for now.  We will try starting Anoro once a day. Call our ofice to let us know if you have benefited. If so we will send a prescription to your pharmacy.  Take albuterol 2 puffs up to every 4 hours if needed for shortness of breath.  Continue your zyrtec once a day.  Try starting OTC Flonase or nasacort to see if this helps with allergies.  Follow with Dr Lamonte Sakai in 6 months or sooner if you have any problems

## 2016-07-29 DIAGNOSIS — L821 Other seborrheic keratosis: Secondary | ICD-10-CM | POA: Diagnosis not present

## 2016-07-29 DIAGNOSIS — L244 Irritant contact dermatitis due to drugs in contact with skin: Secondary | ICD-10-CM | POA: Diagnosis not present

## 2016-08-02 ENCOUNTER — Other Ambulatory Visit: Payer: Self-pay | Admitting: Internal Medicine

## 2016-08-06 ENCOUNTER — Encounter: Payer: Self-pay | Admitting: Sports Medicine

## 2016-08-06 ENCOUNTER — Ambulatory Visit (INDEPENDENT_AMBULATORY_CARE_PROVIDER_SITE_OTHER): Payer: Medicare Other | Admitting: Sports Medicine

## 2016-08-06 VITALS — BP 137/72 | Ht 62.0 in | Wt 260.0 lb

## 2016-08-06 DIAGNOSIS — M79601 Pain in right arm: Secondary | ICD-10-CM | POA: Diagnosis not present

## 2016-08-06 DIAGNOSIS — M25511 Pain in right shoulder: Secondary | ICD-10-CM | POA: Diagnosis not present

## 2016-08-06 MED ORDER — METHYLPREDNISOLONE ACETATE 40 MG/ML IJ SUSP
40.0000 mg | Freq: Once | INTRAMUSCULAR | Status: AC
  Administered 2016-08-06: 40 mg via INTRA_ARTICULAR

## 2016-08-06 NOTE — Progress Notes (Signed)
[facility] Progress Note  Subjective:  Andrea Perry is a 66 y.o. female with history of DJD of left knee and lumbosacral spondylosis s/p L3-4, 4-5 decompression who presents for R shoulder and upper arm pain. Pain began about 1 year ago when patient "over-reached" downwards. She has had a subacromial cortisone shot in the past, which helped. Pain comes and goes but has flared up more over the last 1.5 weeks. Posterior aspect of shoulder has become sore over last 3 days. She denies increased activity or inciting trauma. Has a sensation of "catching" over anterior shoulder. Since over-reaching inciting event about 1 year ago has had trouble brushing her hair and opening jars. Presently, it is difficult for her to lift her R arm over her head. She is right-handed. Has been having some neck tightness over the last couple of days, as well. Tylenol helps some. Would consider surgical options if would help with pain.   Her interim surgical history is significant for a lumbar spine decompression done in November by Dr. Sherley Perry. She has done well postoperatively.  ROS: No fevers, no numbness or tingling  Objective: Blood pressure 137/72, height 5\' 2"  (1.575 m), weight 260 lb (117.9 kg). Body mass index is 47.55 kg/m. Constitutional: Obese female, very pleasant, in NAD Pulmonary/Chest: No respiratory distress.  Musculoskeletal: No gross deformity of R shoulder. Point tenderness to palpation over bicipital groove and below acromion posteriorly. TTP over upper trapezius on R. No midline cervical spinal TTP. Unable to abduct R arm above 110 degrees due to pain. Abduction 4+/5 of R shoulder, 5-/5 of L. Adduction strength intact, 5/5 bilaterally. External rotation of shoulder 4+/5 on R, 5/5 on L; internal rotation 5/5 bilaterally. Able to perform Apley Scratch test bilaterally though slightly limited on R due to pain. Weak with lift off test on R (limited by pain). Positive Empty Can test on  R. Negative drop arm test.  Neurological: No decreased sensation over R UE.  Skin: Skin is warm and dry. No bruising or erythema.  Psychiatric: Normal mood and affect.  Vitals reviewed  [place] of R shoulder:  Partial thickness tear of R biceps longus tendon. R subscapularis longus with hypoechoic area at site of insertion. R supraspinatus intact but adjacent spur on humerus.  R infraspinatus with adjacent spur with hypoechoic area and thinning (concerning for tear/retraction)  Assessment/Plan:  Right subacromial space was injected without difficulty by Dr. Ola Spurr under supervision of Dr. Micheline Chapman.  Consent obtained and verified. Time-out conducted. Noted no overlying erythema, induration, or other signs of local infection. Skin prepped in a sterile fashion. Topical analgesic spray: Ethyl chloride. Joint: right shoulder (subacromial) Needle: 25g 1.5 inch Completed without difficulty. Meds: 3cc 1% xylocaine, 1cc (40mg ) depomedrol  Advised to call if fevers/chills, erythema, induration, drainage, or persistent bleeding.  Arm pain, right - Secondary to partial thickness tear of biceps tendon, confirmed on ultrasound. - Recommended MRI to further assess degree of injury. - Administered steroid injection at subacromial space, as hypoechoic areas on [place] consistent with inflammation and has helped patient with pain in past  Follow-up pending MRI results.  Andrea Floss, MD [place], PGY-2  Patient seen and evaluated with the resident. I agree with the above plan of care. This is a chronic shoulder issue for this patient. Today's ultrasound shows evidence of proximal biceps tendinopathy and partial tearing. She also has changes consistent with partial tears of the subscapularis and infraspinatus as well as spurring and tendinopathy of  the supraspinatus. Patient's subacromial space was injected as above. Patient is interested in discussing surgical options so we will  proceed with an MRI of her shoulder for preoperative planning (patient will need an open MRI due to claustrophobia). Her recent lumbar spine decompression did not involve any hardware or fusions so we should be able to proceed with this study. I will follow-up with her via telephone with those results once available. We will discuss orthopedic referral at that time.

## 2016-08-06 NOTE — Patient Instructions (Signed)
[facility]  [address] [place] Phone: [phone]  MRI Tues 08/11/16 @ 8am

## 2016-08-06 NOTE — Assessment & Plan Note (Signed)
-   Secondary to partial thickness tear of biceps tendon, confirmed on ultrasound. - Recommended MRI to further assess degree of injury. - Administered steroid injection at subacromial space, as hypoechoic areas on [place] consistent with inflammation and has helped patient with pain in past

## 2016-08-11 ENCOUNTER — Ambulatory Visit: Payer: Medicare Other | Admitting: Sports Medicine

## 2016-08-13 ENCOUNTER — Telehealth: Payer: Self-pay | Admitting: *Deleted

## 2016-08-13 MED ORDER — ALPRAZOLAM 1 MG PO TABS: ORAL_TABLET | ORAL | 0 refills | Status: DC

## 2016-08-13 NOTE — Telephone Encounter (Signed)
Xanax 1mg  called in to help with relaxation during her MRI.

## 2016-08-19 DIAGNOSIS — M75111 Incomplete rotator cuff tear or rupture of right shoulder, not specified as traumatic: Secondary | ICD-10-CM | POA: Diagnosis not present

## 2016-08-19 DIAGNOSIS — M66821 Spontaneous rupture of other tendons, right upper arm: Secondary | ICD-10-CM | POA: Diagnosis not present

## 2016-08-19 DIAGNOSIS — R600 Localized edema: Secondary | ICD-10-CM | POA: Diagnosis not present

## 2016-08-19 DIAGNOSIS — M19011 Primary osteoarthritis, right shoulder: Secondary | ICD-10-CM | POA: Diagnosis not present

## 2016-08-22 ENCOUNTER — Other Ambulatory Visit: Payer: Self-pay | Admitting: Internal Medicine

## 2016-08-25 ENCOUNTER — Telehealth: Payer: Self-pay | Admitting: Sports Medicine

## 2016-08-25 NOTE — Telephone Encounter (Signed)
  I spoke with Andrea Perry on the phone today after reviewing the MRI of her right shoulder. She does have a full-thickness tear through the supraspinatus tendon. She also has a rupture of her proximal long head biceps tendon. Tear of the superior labrum as well. Based on this constellation of symptoms I recommend a referral to Dr. Noemi Chapel to discuss merits of rotator cuff repair and possible labral debridement and possible tenotomy. Patient is in agreement with that plan. Patient will follow-up with me as needed.

## 2016-08-25 NOTE — Telephone Encounter (Signed)
Andrea Perry & Andrea Perry [facility] Dr Andrea Perry Thursday 08/27/16 at 2p [place]

## 2016-08-27 DIAGNOSIS — S46011A Strain of muscle(s) and tendon(s) of the rotator cuff of right shoulder, initial encounter: Secondary | ICD-10-CM | POA: Diagnosis not present

## 2016-08-28 ENCOUNTER — Telehealth: Payer: Self-pay | Admitting: Emergency Medicine

## 2016-08-28 NOTE — Telephone Encounter (Signed)
lmomtcb x1 

## 2016-08-28 NOTE — Telephone Encounter (Signed)
Last seen 06/24/16. She stated that the anoro seemed to help more than the advair.  She is having surgery on her right shoulder surgery---she over extended it about 4-5 months ago---torn ligaments and 2 bone spurs to remove.  RB please advise if she needs to be seen again.  thanks

## 2016-08-28 NOTE — Telephone Encounter (Signed)
Pt returning call.Andrea Perry ° °

## 2016-08-31 ENCOUNTER — Telehealth: Payer: Self-pay

## 2016-08-31 DIAGNOSIS — S46011D Strain of muscle(s) and tendon(s) of the rotator cuff of right shoulder, subsequent encounter: Secondary | ICD-10-CM | POA: Diagnosis not present

## 2016-08-31 MED ORDER — UMECLIDINIUM-VILANTEROL 62.5-25 MCG/INH IN AEPB: 1.0000 | INHALATION_SPRAY | Freq: Every day | RESPIRATORY_TRACT | 3 refills | Status: DC

## 2016-08-31 NOTE — Telephone Encounter (Signed)
Request for surgical clearance:  1. What type of surgery is being performed? Right Shoulder Scope and rotator cuff repair.   2. When is this surgery scheduled? Pending   3. Are there any medications that need to be held prior to surgery and how long? Okay to stop Eliquis?   4. Name of physician performing surgery?  Dr. Elsie Saas   5. What is your office phone and fax number? Phone number- [phone] Ext [phone] ask for Sherri . Fax [phone]

## 2016-08-31 NOTE — Telephone Encounter (Signed)
Ok to have shoulder surgery and hold eliquis 2 days before

## 2016-08-31 NOTE — Telephone Encounter (Signed)
Patient returning call - she can be reached at [phone]- ok to leave detailed message - pr

## 2016-08-31 NOTE — Telephone Encounter (Signed)
If she is improved, no new problems and issues then we should be able to describe her surgical risk without having an new OV.

## 2016-08-31 NOTE — Telephone Encounter (Signed)
Spoke with patient-she states she is doing better and feels improved. Would like Rx for Anoro sent to [place] area. Pt is aware that I have sent Rx as requested. She will update her surgeon and will have any surgical clearance forms faxed to our office. Pt has not been scheduled for surgery at this time but is looking to have it done after school year ends.

## 2016-08-31 NOTE — Telephone Encounter (Signed)
lmtcb x1 for pt. 

## 2016-09-01 NOTE — Telephone Encounter (Signed)
Sent fax to number provided

## 2016-09-08 ENCOUNTER — Ambulatory Visit (INDEPENDENT_AMBULATORY_CARE_PROVIDER_SITE_OTHER): Payer: Medicare Other | Admitting: Cardiology

## 2016-09-08 ENCOUNTER — Encounter (INDEPENDENT_AMBULATORY_CARE_PROVIDER_SITE_OTHER): Payer: Self-pay

## 2016-09-08 ENCOUNTER — Encounter: Payer: Self-pay | Admitting: Cardiology

## 2016-09-08 VITALS — BP 138/86 | HR 76 | Ht 62.0 in | Wt 275.0 lb

## 2016-09-08 DIAGNOSIS — E782 Mixed hyperlipidemia: Secondary | ICD-10-CM | POA: Diagnosis not present

## 2016-09-08 DIAGNOSIS — J449 Chronic obstructive pulmonary disease, unspecified: Secondary | ICD-10-CM

## 2016-09-08 DIAGNOSIS — I481 Persistent atrial fibrillation: Secondary | ICD-10-CM

## 2016-09-08 DIAGNOSIS — I10 Essential (primary) hypertension: Secondary | ICD-10-CM

## 2016-09-08 DIAGNOSIS — I4819 Other persistent atrial fibrillation: Secondary | ICD-10-CM

## 2016-09-08 DIAGNOSIS — I272 Pulmonary hypertension, unspecified: Secondary | ICD-10-CM

## 2016-09-08 NOTE — Progress Notes (Signed)
Cardiology Office Note   Date:  09/08/2016   ID:  Andrea Perry, DOB [birth year], MRN [id number]  PCP:  Biagio Borg, MD  Cardiologist:  Dr. Johnsie Cancel   Sleep Dr. Radford Pax   Chief Complaint  Patient presents with  . Atrial Fibrillation      History of Present Illness: Andrea Perry is a 66 y.o. female who presents for PAF on Eliquis, pulmonary HTN, HTN presents for eval.  Last seen 09/2015 by Dr. Johnsie Cancel.   In addition to above pt has severe COPD, HLD, and morbid obesity.    Failed DCCV on 03/20/14 started on flecainide. She was unable to do ETT due to arthritis. She did do a Lexiscan1/2016 which showed no ischemia or infarction. EF was 40% but follow up MUGA test showed normal EF of 52%. She continues on Flecaide 50mg  BID. She underwent successful DCCV on 10/30/14 and done well since that time.  Neg sleep study in 2016.  One hospitalization for chest pain in 2017 neg for MI.    She needs Rt shoulder surgery. Dr. Johnsie Cancel did clear for surgery and to hold eliquis 2 days before.   Today only complains of lower ext edema and her SOB is stable.  No chest pain.  Looking through old EKGs she has been in a fib since July, rate up and down.  She notes that at times it elevates.  Today with edema to her knees 2+.  She missed a step and fell last week, onto her rt side, bruise around eye, no loss of consciousness..   No headache or lightheadedness since.     Past Medical History:  Diagnosis Date  . ALLERGIC RHINITIS 08/16/2008  . ANXIETY 08/16/2008  . Arthritis    hands, knees, lower back  . ASTHMA 08/16/2008  . Asthma    BRONCHITIS     DR. Lamonte Sakai    . Bladder leak   . Cancer ([place])    MELANOMA      . Chronic lower back pain    problems with disc L2-5  . COPD 08/16/2008  . DEPRESSION 08/16/2008  . Dysrhythmia    hx AF  . Excessive daytime sleepiness 06/19/2015  . GENITAL HERPES 12/03/2006  . GERD (gastroesophageal reflux disease)   . H/O hiatal hernia   . HEMATOCHEZIA 06/20/2009  . Hemorrhoids    . History of cardioversion 10/30/14  . History of kidney stones   . HYPERLIPIDEMIA 08/16/2008  . HYPERTENSION 12/03/2006  . Hypertension   . Impaired glucose tolerance 09/21/2010  . Overactive bladder 10/20/2015  . Pneumonia    as a child  . Pulmonary HTN ([place]) 06/19/2015  . Shortness of breath dyspnea   . Sleep apnea    MILD NO CPAP ORDERED  . Snoring 06/19/2015    Past Surgical History:  Procedure Laterality Date  . ABDOMINAL HYSTERECTOMY    . APPENDECTOMY    . CARDIOVERSION N/A 03/20/2014   Procedure: CARDIOVERSION;  Surgeon: Josue Hector, MD;  Location: [facility] ENDOSCOPY;  Service: Cardiovascular;  Laterality: N/A;  . CARDIOVERSION N/A 10/30/2014   Procedure: CARDIOVERSION;  Surgeon: Josue Hector, MD;  Location: [facility] ENDOSCOPY;  Service: Cardiovascular;  Laterality: N/A;  . CARPAL TUNNEL RELEASE     Right + LEFT  . CESAREAN SECTION     x 1   . COLONOSCOPY  2004  . CYST EXCISION     RT ARM   . CYSTOCELE REPAIR N/A 10/25/2012   Procedure: ANTERIOR REPAIR (CYSTOCELE);  Surgeon:  Gus Height, MD;  Location: [facility];  Service: Gynecology;  Laterality: N/A;  . HEEL SPUR SURGERY Bilateral   . KNEE SURGERY     Right + LEFT  . LUMBAR LAMINECTOMY/DECOMPRESSION MICRODISCECTOMY N/A 03/19/2016   Procedure: Laminectomy and Foraminotomy - Lumbar three-four - Lumbar four-five;  Surgeon: Eustace Moore, MD;  Location: [place];  Service: Neurosurgery;  Laterality: N/A;  . RADIOLOGY WITH ANESTHESIA Right 11/01/2014   Procedure: MRI RIGHT FOREARM;  Surgeon: Medication Radiologist, MD;  Location: [place];  Service: Radiology;  Laterality: Right;  . RADIOLOGY WITH ANESTHESIA Right 08/29/2015   Procedure: MRI - RIGHT FOREARM;  Surgeon: Medication Radiologist, MD;  Location: [place];  Service: Radiology;  Laterality: Right;  . RADIOLOGY WITH ANESTHESIA N/A 12/05/2015   Procedure: MRI SPINE WITHOUT;  Surgeon: Medication Radiologist, MD;  Location: [place];  Service: Radiology;  Laterality: N/A;  . SKIN CANCER EXCISION      BILAT SHOULDERS  . SPLIT NIGHT STUDY  09/04/2015  . TONSILLECTOMY AND ADENOIDECTOMY       Current Outpatient Prescriptions  Medication Sig Dispense Refill  . albuterol (VENTOLIN HFA) 108 (90 Base) MCG/ACT inhaler Inhale 2 puffs into the lungs every 6 (six) hours as needed for wheezing or shortness of breath. Yearly physical due in June must see MD for refills 54 each 0  . ALPRAZolam (XANAX) 1 MG tablet Take one pill one hour before your MRI appointment 2 tablet 0  . amLODipine (NORVASC) 10 MG tablet Take 1 tablet (10 mg total) by mouth daily. 90 tablet 3  . apixaban (ELIQUIS) 5 MG TABS tablet Take 1 tablet (5 mg total) by mouth 2 (two) times daily. 60 tablet 11  . Black Cohosh 20 MG TABS Take 40 mg by mouth daily.    . budesonide-formoterol (SYMBICORT) 160-4.5 MCG/ACT inhaler Inhale 1 puff into the lungs 2 (two) times daily. 1 Inhaler 5  . cetirizine (ZYRTEC) 10 MG tablet Take 10 mg by mouth daily.     . Cholecalciferol (VITAMIN D) 2000 units tablet Take 2,000 Units by mouth daily.    . Cyanocobalamin (VITAMELTS ENERGY VITAMIN B-12) 1500 MCG TBDP Take 1,500 mcg by mouth daily.    Marland Kitchen dimenhyDRINATE (DRAMAMINE) 50 MG tablet Take 50 mg by mouth daily as needed for nausea.    Marland Kitchen escitalopram (LEXAPRO) 20 MG tablet Take 20 mg by mouth daily.    Marland Kitchen estradiol (ESTRACE) 2 MG tablet Take 2 mg by mouth daily.    . flecainide (TAMBOCOR) 50 MG tablet Take 1 tablet (50 mg total) by mouth 2 (two) times daily. 180 tablet 3  . furosemide (LASIX) 40 MG tablet TAKE ONE TABLET BY MOUTH ONCE DAILY 90 tablet 2  . Garlic 7096 MG CAPS Take 1,000 mg by mouth at bedtime.     Marland Kitchen guaiFENesin (MUCINEX) 600 MG 12 hr tablet Take 600 mg by mouth 2 (two) times daily as needed for cough or to loosen phlegm.    . irbesartan (AVAPRO) 150 MG tablet TAKE ONE TABLET BY MOUTH ONCE DAILY 90 tablet 3  . LORazepam (ATIVAN) 1 MG tablet TAKE ONE TABLET BY MOUTH AT BEDTIME AS NEEDED FOR ANXIETY 30 tablet 3  . metoprolol tartrate  (LOPRESSOR) 50 MG tablet Take 75 mg by mouth 2 (two) times daily.    . Omega-3 Fatty Acids (FISH OIL) 1200 MG CAPS Take 1,200 mg by mouth daily.     Marland Kitchen oxyCODONE-acetaminophen (PERCOCET/ROXICET) 5-325 MG tablet Take 1-2 tablets by mouth every 6 (six)  hours as needed.   0  . pantoprazole (PROTONIX) 40 MG tablet TAKE ONE TABLET BY MOUTH ONCE DAILY 90 tablet 3  . potassium chloride SA (K-DUR,KLOR-CON) 20 MEQ tablet Take 1 tablet (20 mEq total) by mouth daily. 90 tablet 3  . pravastatin (PRAVACHOL) 40 MG tablet TAKE ONE TABLET BY MOUTH ONCE DAILY 90 tablet 2  . promethazine-codeine (PHENERGAN WITH CODEINE) 6.25-10 MG/5ML syrup Take 5 mLs by mouth every 4 (four) hours as needed for cough. 180 mL 0  . Spacer/Aero-Holding Chambers (AEROCHAMBER MV) inhaler Use as instructed 1 each 0  . Tetrahydrozoline HCl (VISINE OP) Place 1 drop into both eyes daily as needed (dry eyes).    Marland Kitchen umeclidinium-vilanterol (ANORO ELLIPTA) 62.5-25 MCG/INH AEPB Inhale 1 puff into the lungs daily. 1 each 3  . vitamin E 400 UNIT capsule Take 400 Units by mouth daily.     No current facility-administered medications for this visit.     Allergies:   Tramadol hcl; Codeine; Tape; and Vicodin [hydrocodone-acetaminophen]    Social History:  The patient  reports that she quit smoking about 13 years ago. Her smoking use included Cigarettes. She has a 45.00 pack-year smoking history. She has never used smokeless tobacco. She reports that she drinks alcohol. She reports that she does not use drugs.   Family History:  The patient's family history includes Colon polyps in her mother; Diabetes in her mother; Hyperlipidemia in her mother; Hypertension in her mother; Stroke in her father.    ROS:  General:no colds or fevers, wt is up 15 lbs. In a month.  Skin:no rashes or ulcers HEENT:no blurred vision, no congestion CV:see HPI PUL:see HPI GI:no diarrhea constipation or melena, no indigestion GU:no hematuria, no dysuria MS:+ joint pain  of Rt shoulder and Rt knee, no claudication Neuro:no syncope, no lightheadedness Endo:no diabetes, no thyroid disease  Wt Readings from Last 3 Encounters:  09/08/16 275 lb (124.7 kg)  08/06/16 260 lb (117.9 kg)  06/24/16 270 lb 3.2 oz (122.6 kg)     PHYSICAL EXAM: VS:  BP 138/86   Pulse 76   Ht 5\' 2"  (1.575 m)   Wt 275 lb (124.7 kg)   BMI 50.30 kg/m  , BMI Body mass index is 50.3 kg/m. General:Pleasant affect, NAD Skin:Warm and dry, brisk capillary refill HEENT:normocephalic, sclera clear, mucus membranes moist Neck:supple, no JVD, no bruits  Heart:S1S2 RRR without murmur, gallup, rub or click Lungs:clear without rales, rhonchi, or wheezes AJG:OTLX, non tender, + BS, do not palpate liver spleen or masses Ext:2+ lower ext edema to knees, 2+ pedal pulses, 2+ radial pulses Neuro:alert and oriented X 3, MAE, follows commands, + facial symmetry    EKG:  EKG is ordered today. The ekg ordered today demonstrates a fib non specific T wave abnormality though no acute changes from 02/2017.     Recent Labs: 10/16/2015: ALT 24; TSH 5.08 03/18/2016: BUN 11; Creatinine, Ser 0.73; Hemoglobin 13.1; Platelets 260; Potassium 4.3; Sodium 143    Lipid Panel    Component Value Date/Time   CHOL 219 (H) 10/16/2015 1437   TRIG 275.0 (H) 10/16/2015 1437   HDL 41.20 10/16/2015 1437   CHOLHDL 5 10/16/2015 1437   VLDL 55.0 (H) 10/16/2015 1437   LDLCALC 50 04/04/2014 1158   LDLDIRECT 141.0 10/16/2015 1437       Other studies Reviewed: Additional studies/ records that were reviewed today include: . Echo 01/2014  Study Conclusions  - Left ventricle: Inferobasal hypokinesis. The cavity size was normal. Wall  thickness was normal. Systolic function was normal. The estimated ejection fraction was in the range of 50% to 55%. - Left atrium: The atrium was mildly dilated. - Atrial septum: No defect or patent foramen ovale was identified. - Pulmonary arteries: PA peak pressure: 41 mm Hg  (S).  nuc study 04/2014  Overall Impression:  Low risk stress nuclear study with small size and mild intensity fixed distal anteroapical breast attenuation artifact.  LV Ejection Fraction: 44%.  LV Wall Motion:  Mild global hypokinesis    ASSESSMENT AND PLAN:  1.  Persistent a fib, most likely since 10/2016.  She is aware of rapid HR at times.  Discussed with DOD Dr. Irish Lack  Pt for rt shoulder surgery so we will leave in a fib for now and post surgery she will come back and plan will be to address a fib at that time. If she were to convert now we soul need to postpone surgery to continue anticoagulation.   2. Lower ext edema - will give lasix 80 mg daily for 3 days then back to 40 mg daily.  Check BMP today.  3. anticoagulation on eliquis   4. HTN controlled  5. HLD continue statin  6. COPD per pulmonary   Current medicines are reviewed with the patient today.  The patient Has no concerns regarding medicines.  The following changes have been made:  See above Labs/ tests ordered today include:see above  Disposition:   FU:  see above  Signed, Andrea Kicks, NP  09/08/2016 2:04 PM    [facility] [place], [place], [place] [address] [place], [place] Phone: [phone]; Fax: [phone]

## 2016-09-08 NOTE — Patient Instructions (Signed)
Medication Instructions:  Your physician has recommended you make the following change in your medication: 1.) START 80 mg of your LASIX in the morning for the next 3 days. Then go back to the regular dosage.   Labwork: Your physician recommends that you return for lab work today for BMET, BNP  Testing/Procedures: None Ordered   Follow-Up: Your physician recommends that you schedule a follow-up appointment after surgery with Dr. Johnsie Cancel.   Any Other Special Instructions Will Be Listed Below (If Applicable).     If you need a refill on your cardiac medications before your next appointment, please call your pharmacy.  Thank you for choosing [facility]

## 2016-09-09 ENCOUNTER — Telehealth: Payer: Self-pay | Admitting: *Deleted

## 2016-09-09 LAB — BASIC METABOLIC PANEL
BUN/Creatinine Ratio: 18 (ref 12–28)
BUN: 11 mg/dL (ref 8–27)
CALCIUM: 8.8 mg/dL (ref 8.7–10.3)
CHLORIDE: 103 mmol/L (ref 96–106)
CO2: 25 mmol/L (ref 18–29)
Creatinine, Ser: 0.6 mg/dL (ref 0.57–1.00)
GFR, EST AFRICAN AMERICAN: 112 mL/min/{1.73_m2} (ref >59)
GFR, EST NON AFRICAN AMERICAN: 97 mL/min/{1.73_m2} (ref >59)
Glucose: 116 mg/dL — ABNORMAL HIGH (ref 65–99)
Potassium: 4.2 mmol/L (ref 3.5–5.2)
Sodium: 143 mmol/L (ref 134–144)

## 2016-09-09 LAB — PRO B NATRIURETIC PEPTIDE: NT-Pro BNP: 919 pg/mL — ABNORMAL HIGH (ref 0–287)

## 2016-09-09 NOTE — Telephone Encounter (Signed)
Left message to call back  

## 2016-09-09 NOTE — Telephone Encounter (Signed)
-----   Message from Isaiah Serge, NP sent at 09/09/2016  8:38 AM EDT ----- I clicked wrong button and lost labs --but please let pt know the lasix should help and when she decreases back to 40 daily if edema returns please call.  And we would adjust lasix for longer time.  Decrease salt, elevate legs.  thanks

## 2016-09-09 NOTE — Telephone Encounter (Signed)
Spoke with pt and made her aware of lab results and recommendations per Cecilie Kicks, NP.  Pt verbalized understanding and was appreciative for call.

## 2016-09-09 NOTE — Telephone Encounter (Signed)
Follow up ° ° ° ° °Returning a call from the nurse °

## 2016-09-11 ENCOUNTER — Ambulatory Visit (INDEPENDENT_AMBULATORY_CARE_PROVIDER_SITE_OTHER): Payer: Medicare Other | Admitting: Internal Medicine

## 2016-09-11 ENCOUNTER — Encounter: Payer: Self-pay | Admitting: Internal Medicine

## 2016-09-11 VITALS — BP 114/76 | HR 129 | Ht 62.0 in | Wt 267.0 lb

## 2016-09-11 DIAGNOSIS — I10 Essential (primary) hypertension: Secondary | ICD-10-CM

## 2016-09-11 DIAGNOSIS — Z01818 Encounter for other preprocedural examination: Secondary | ICD-10-CM | POA: Diagnosis not present

## 2016-09-11 DIAGNOSIS — E119 Type 2 diabetes mellitus without complications: Secondary | ICD-10-CM

## 2016-09-11 LAB — POCT GLYCOSYLATED HEMOGLOBIN (HGB A1C): HEMOGLOBIN A1C: 6

## 2016-09-11 NOTE — Patient Instructions (Addendum)
Your AIC was OK today  Please continue all other medications as before, and refills have been done if requested.  Please have the pharmacy call with any other refills you may need.  Please keep your appointments with your specialists as you may have planned  We will send a letter to Dr Archie Endo office for the [facility] for surgury  Please return in 6 months, or sooner if needed, with Lab testing done 3-5 days before  OK to cancel the June 2018 appt with me

## 2016-09-11 NOTE — Assessment & Plan Note (Signed)
stable overall by history and exam, recent data reviewed with pt, and pt to continue medical treatment as before,  to f/u any worsening symptoms or concerns Lab Results  Component Value Date   HGBA1C 6.0 09/11/2016

## 2016-09-11 NOTE — Assessment & Plan Note (Signed)
stable overall by history and exam, recent data reviewed with pt, and pt to continue medical treatment as before,  to f/u any worsening symptoms or concerns BP Readings from Last 3 Encounters:  09/11/16 114/76  09/08/16 138/86  08/06/16 137/72

## 2016-09-11 NOTE — Assessment & Plan Note (Signed)
[place] for [employer] without further restrictions; will need eliquis held and consider periop lovenox given her afib

## 2016-09-11 NOTE — Progress Notes (Signed)
Subjective:    Patient ID: Andrea Perry, female    DOB: 1950-07-03, 66 y.o.   MRN: [id number]  HPI  Here for medical clearance for proposed right shoulder surgury soon per Dr Yvonne Kendall (not yet scheduled).   Has already seen cardiology and pulm  - ok for surgury.  Pt denies chest pain, increased sob or doe, wheezing, orthopnea, PND, increased LE swelling, palpitations, dizziness or syncope.  Pt denies new neurological symptoms such as new headache, or facial or extremity weakness or numbness   Pt denies polydipsia, polyuria.  Pt states overall good compliance with meds, trying to follow lower cholesterol, diabetic diet, wt overall stable but little exercise however due to pain.  Did have a fall 3 wk ago accidental unfortunately to the right side, aggrevating the right shoulder pain and still has healing bruising about the right eye.  Denies urinary symptoms such as dysuria, frequency, urgency, flank pain, hematuria or n/v, fever, chills.   Pt denies fever, wt loss, night sweats, loss of appetite, or other constitutional symptoms  No other specific complaints Lab Results  Component Value Date   WBC 7.5 03/18/2016   HGB 13.1 03/18/2016   HCT 40.1 03/18/2016   PLT 260 03/18/2016   GLUCOSE 116 (H) 09/08/2016   CHOL 219 (H) 10/16/2015   TRIG 275.0 (H) 10/16/2015   HDL 41.20 10/16/2015   LDLDIRECT 141.0 10/16/2015   LDLCALC 50 04/04/2014   ALT 24 10/16/2015   AST 30 10/16/2015   NA 143 09/08/2016   K 4.2 09/08/2016   CL 103 09/08/2016   CREATININE 0.60 09/08/2016   BUN 11 09/08/2016   CO2 25 09/08/2016   TSH 5.08 (H) 10/16/2015   INR 1.00 03/18/2016   HGBA1C 6.2 04/16/2016   MICROALBUR 1.9 10/16/2015   Current Outpatient Prescriptions on File Prior to Visit  Medication Sig Dispense Refill  . albuterol (VENTOLIN HFA) 108 (90 Base) MCG/ACT inhaler Inhale 2 puffs into the lungs every 6 (six) hours as needed for wheezing or shortness of breath. Yearly physical due in June must see MD for  refills 54 each 0  . ALPRAZolam (XANAX) 1 MG tablet Take one pill one hour before your MRI appointment 2 tablet 0  . amLODipine (NORVASC) 10 MG tablet Take 1 tablet (10 mg total) by mouth daily. 90 tablet 3  . apixaban (ELIQUIS) 5 MG TABS tablet Take 1 tablet (5 mg total) by mouth 2 (two) times daily. 60 tablet 11  . Black Cohosh 20 MG TABS Take 40 mg by mouth daily.    . cetirizine (ZYRTEC) 10 MG tablet Take 10 mg by mouth daily.     . Cholecalciferol (VITAMIN D) 2000 units tablet Take 2,000 Units by mouth daily.    . Cyanocobalamin (VITAMELTS ENERGY VITAMIN B-12) 1500 MCG TBDP Take 1,500 mcg by mouth daily.    Marland Kitchen dimenhyDRINATE (DRAMAMINE) 50 MG tablet Take 50 mg by mouth daily as needed for nausea.    Marland Kitchen escitalopram (LEXAPRO) 20 MG tablet Take 20 mg by mouth daily.    Marland Kitchen estradiol (ESTRACE) 2 MG tablet Take 2 mg by mouth daily.    . flecainide (TAMBOCOR) 50 MG tablet Take 1 tablet (50 mg total) by mouth 2 (two) times daily. 180 tablet 3  . furosemide (LASIX) 40 MG tablet TAKE ONE TABLET BY MOUTH ONCE DAILY 90 tablet 2  . Garlic 2703 MG CAPS Take 1,000 mg by mouth at bedtime.     Marland Kitchen guaiFENesin (MUCINEX) 600 MG 12  hr tablet Take 600 mg by mouth 2 (two) times daily as needed for cough or to loosen phlegm.    . irbesartan (AVAPRO) 150 MG tablet TAKE ONE TABLET BY MOUTH ONCE DAILY 90 tablet 3  . LORazepam (ATIVAN) 1 MG tablet TAKE ONE TABLET BY MOUTH AT BEDTIME AS NEEDED FOR ANXIETY 30 tablet 3  . metoprolol tartrate (LOPRESSOR) 50 MG tablet Take 75 mg by mouth 2 (two) times daily.    . Omega-3 Fatty Acids (FISH OIL) 1200 MG CAPS Take 1,200 mg by mouth daily.     . pantoprazole (PROTONIX) 40 MG tablet TAKE ONE TABLET BY MOUTH ONCE DAILY 90 tablet 3  . potassium chloride SA (K-DUR,KLOR-CON) 20 MEQ tablet Take 1 tablet (20 mEq total) by mouth daily. 90 tablet 3  . pravastatin (PRAVACHOL) 40 MG tablet TAKE ONE TABLET BY MOUTH ONCE DAILY 90 tablet 2  . promethazine-codeine (PHENERGAN WITH CODEINE)  6.25-10 MG/5ML syrup Take 5 mLs by mouth every 4 (four) hours as needed for cough. 180 mL 0  . Spacer/Aero-Holding Chambers (AEROCHAMBER MV) inhaler Use as instructed 1 each 0  . Tetrahydrozoline HCl (VISINE OP) Place 1 drop into both eyes daily as needed (dry eyes).    Marland Kitchen umeclidinium-vilanterol (ANORO ELLIPTA) 62.5-25 MCG/INH AEPB Inhale 1 puff into the lungs daily. 1 each 3  . vitamin E 400 UNIT capsule Take 400 Units by mouth daily.     No current facility-administered medications on file prior to visit.    Review of Systems  Constitutional: Negative for other unusual diaphoresis or sweats HENT: Negative for ear discharge or swelling Eyes: Negative for other worsening visual disturbances Respiratory: Negative for stridor or other swelling  Gastrointestinal: Negative for worsening distension or other blood Genitourinary: Negative for retention or other urinary change Musculoskeletal: Negative for other MSK pain or swelling Skin: Negative for color change or other new lesions Neurological: Negative for worsening tremors and other numbness  Psychiatric/Behavioral: Negative for worsening agitation or other fatigue All other system neg per pt    Objective:   Physical Exam BP 114/76   Pulse (!) 129   Ht 5\' 2"  (1.575 m)   Wt 267 lb (121.1 kg)   SpO2 98%   BMI 48.83 kg/m  VS noted, not ill appearing, morbid obese Constitutional: Pt appears in NAD HENT: Head: NCAT.  Right Ear: External ear normal.  Left Ear: External ear normal.  Eyes: . Pupils are equal, round, and reactive to light. Conjunctivae and EOM are normal Nose: without d/c or deformity Neck: Neck supple. Gross normal ROM Cardiovascular: Normal rate and IRIR.   Pulmonary/Chest: Effort normal and breath sounds without rales or wheezing.  Abd:  Soft, NT, ND, + BS, no organomegaly Neurological: Pt is alert. At baseline orientation, motor grossly intact Skin: Skin is warm. No rashes, other new lesions except for brownish tan  bruising about the right eye with trace swelling, has chronic 1-2+ bilat LE edema to knees Psychiatric: Pt behavior is normal without agitation  No other exam findings  Lab Results  Component Value Date   WBC 7.5 03/18/2016   HGB 13.1 03/18/2016   HCT 40.1 03/18/2016   PLT 260 03/18/2016   GLUCOSE 116 (H) 09/08/2016   CHOL 219 (H) 10/16/2015   TRIG 275.0 (H) 10/16/2015   HDL 41.20 10/16/2015   LDLDIRECT 141.0 10/16/2015   LDLCALC 50 04/04/2014   ALT 24 10/16/2015   AST 30 10/16/2015   NA 143 09/08/2016   K 4.2 09/08/2016  CL 103 09/08/2016   CREATININE 0.60 09/08/2016   BUN 11 09/08/2016   CO2 25 09/08/2016   TSH 5.08 (H) 10/16/2015   INR 1.00 03/18/2016   HGBA1C 6.2 04/16/2016   MICROALBUR 1.9 10/16/2015   POCT glycosylated hemoglobin (Hb A1C)  Order: [id number]  Status:  Final result  Visible to patient:  No (Not Released)  Dx:  Type 2 diabetes mellitus without comp...   11:09  Hemoglobin A1C 6.0     Specimen Collected: 09/11/16 11:09 Last Resulted: 09/11/16              Assessment & Plan:

## 2016-09-16 ENCOUNTER — Telehealth: Payer: Self-pay | Admitting: Cardiovascular Disease

## 2016-09-16 NOTE — Telephone Encounter (Signed)
Sherri calling from Elsie Saas' office and asks if you could please fax over surgical clearance form again. She states that the fax machine server was down and that they did not receive fax. Thanks.   Request for surgical clearance:  1. What type of surgery is being performed? Right Shoulder Scope and rotator cuff repair.   2. When is this surgery scheduled? Pending   3. Are there any medications that need to be held prior to surgery and how long? Okay to stop Eliquis?   4. Name of physician performing surgery?  Dr. Elsie Saas   5. What is your office phone and fax number? Phone number- [phone] Ext [phone] ask for Sherri . Fax [phone]

## 2016-09-16 NOTE — Telephone Encounter (Signed)
Resent fax from phone note on 08/31/16

## 2016-10-07 ENCOUNTER — Other Ambulatory Visit: Payer: Self-pay | Admitting: Cardiovascular Disease

## 2016-10-07 ENCOUNTER — Other Ambulatory Visit: Payer: Self-pay | Admitting: Internal Medicine

## 2016-10-07 NOTE — Telephone Encounter (Signed)
Done hardcopy to Shirron  

## 2016-10-07 NOTE — Telephone Encounter (Signed)
faxed

## 2016-10-16 ENCOUNTER — Ambulatory Visit: Payer: Medicare Other | Admitting: Internal Medicine

## 2016-11-03 ENCOUNTER — Ambulatory Visit (INDEPENDENT_AMBULATORY_CARE_PROVIDER_SITE_OTHER): Payer: Medicare Other | Admitting: Internal Medicine

## 2016-11-03 ENCOUNTER — Encounter: Payer: Self-pay | Admitting: Internal Medicine

## 2016-11-03 VITALS — BP 110/72 | HR 61 | Ht 62.0 in | Wt 270.0 lb

## 2016-11-03 DIAGNOSIS — R21 Rash and other nonspecific skin eruption: Secondary | ICD-10-CM

## 2016-11-03 DIAGNOSIS — T148XXA Other injury of unspecified body region, initial encounter: Secondary | ICD-10-CM

## 2016-11-03 DIAGNOSIS — J438 Other emphysema: Secondary | ICD-10-CM | POA: Diagnosis not present

## 2016-11-03 DIAGNOSIS — I10 Essential (primary) hypertension: Secondary | ICD-10-CM

## 2016-11-03 MED ORDER — NYSTATIN 100000 UNIT/GM EX POWD: CUTANEOUS | 1 refills | Status: DC

## 2016-11-03 MED ORDER — KETOCONAZOLE 2 % EX CREA: 1.0000 "application " | TOPICAL_CREAM | Freq: Every day | CUTANEOUS | 0 refills | Status: DC

## 2016-11-03 NOTE — Progress Notes (Signed)
Subjective:    Patient ID: Andrea Perry, female    DOB: 05-08-1950, 66 y.o.   MRN: [id number]  HPI  Here to f/u, has new 3 mo dog but not involved when pt fell off the bed left side 2 days ago, hit the left hip and side with bruising and a egg sized knot as well at the center, now with mild constant sharp pain, without radiation but is tender to palpate. Has not stopped her eliquis.  Denies urinary symptoms such as dysuria, frequency, urgency, flank pain, hematuria or n/v, fever, chills. Denies worsening reflux, abd pain, dysphagia, n/v, bowel change or blood.  Does have intertrigo like rash below both breasts and groin areas x 1 wk, asks for tx antifungal as this has helped before. As mild itchy constant discomfort to the groin areas, but no drainage, fever, swelling. Pt denies chest pain, increased sob or doe, wheezing, orthopnea, PND, increased LE swelling, palpitations, dizziness or syncope. Past Medical History:  Diagnosis Date  . ALLERGIC RHINITIS 08/16/2008  . ANXIETY 08/16/2008  . Arthritis    hands, knees, lower back  . ASTHMA 08/16/2008  . Asthma    BRONCHITIS     DR. Lamonte Sakai    . Bladder leak   . Cancer ([place])    MELANOMA      . Chronic lower back pain    problems with disc L2-5  . COPD 08/16/2008  . DEPRESSION 08/16/2008  . Dysrhythmia    hx AF  . Excessive daytime sleepiness 06/19/2015  . GENITAL HERPES 12/03/2006  . GERD (gastroesophageal reflux disease)   . H/O hiatal hernia   . HEMATOCHEZIA 06/20/2009  . Hemorrhoids   . History of cardioversion 10/30/14  . History of kidney stones   . HYPERLIPIDEMIA 08/16/2008  . HYPERTENSION 12/03/2006  . Hypertension   . Impaired glucose tolerance 09/21/2010  . Overactive bladder 10/20/2015  . Pneumonia    as a child  . Pulmonary HTN ([place]) 06/19/2015  . Shortness of breath dyspnea   . Sleep apnea    MILD NO CPAP ORDERED  . Snoring 06/19/2015   Past Surgical History:  Procedure Laterality Date  . ABDOMINAL HYSTERECTOMY    . APPENDECTOMY      . CARDIOVERSION N/A 03/20/2014   Procedure: CARDIOVERSION;  Surgeon: Josue Hector, MD;  Location: [facility] ENDOSCOPY;  Service: Cardiovascular;  Laterality: N/A;  . CARDIOVERSION N/A 10/30/2014   Procedure: CARDIOVERSION;  Surgeon: Josue Hector, MD;  Location: [facility] ENDOSCOPY;  Service: Cardiovascular;  Laterality: N/A;  . CARPAL TUNNEL RELEASE     Right + LEFT  . CESAREAN SECTION     x 1   . COLONOSCOPY  2004  . CYST EXCISION     RT ARM   . CYSTOCELE REPAIR N/A 10/25/2012   Procedure: ANTERIOR REPAIR (CYSTOCELE);  Surgeon: Gus Height, MD;  Location: [facility];  Service: Gynecology;  Laterality: N/A;  . HEEL SPUR SURGERY Bilateral   . KNEE SURGERY     Right + LEFT  . LUMBAR LAMINECTOMY/DECOMPRESSION MICRODISCECTOMY N/A 03/19/2016   Procedure: Laminectomy and Foraminotomy - Lumbar three-four - Lumbar four-five;  Surgeon: Eustace Moore, MD;  Location: [place];  Service: Neurosurgery;  Laterality: N/A;  . RADIOLOGY WITH ANESTHESIA Right 11/01/2014   Procedure: MRI RIGHT FOREARM;  Surgeon: Medication Radiologist, MD;  Location: [place];  Service: Radiology;  Laterality: Right;  . RADIOLOGY WITH ANESTHESIA Right 08/29/2015   Procedure: MRI - RIGHT FOREARM;  Surgeon: Medication Radiologist, MD;  Location: [place];  Service: Radiology;  Laterality: Right;  . RADIOLOGY WITH ANESTHESIA N/A 12/05/2015   Procedure: MRI SPINE WITHOUT;  Surgeon: Medication Radiologist, MD;  Location: [place];  Service: Radiology;  Laterality: N/A;  . SKIN CANCER EXCISION     BILAT SHOULDERS  . SPLIT NIGHT STUDY  09/04/2015  . TONSILLECTOMY AND ADENOIDECTOMY      reports that she quit smoking about 13 years ago. Her smoking use included Cigarettes. She has a 45.00 pack-year smoking history. She has never used smokeless tobacco. She reports that she drinks alcohol. She reports that she does not use drugs. family history includes Colon polyps in her mother; Diabetes in her mother; Hyperlipidemia in her mother; Hypertension in her mother;  Stroke in her father. Allergies  Allergen Reactions  . Tramadol Hcl Nausea And Vomiting and Other (See Comments)     mouth dryness, headache  . Codeine Nausea And Vomiting  . Tape Rash    Plastic tape, bandaids and ekg leads, causes redness and rash  . Vicodin [Hydrocodone-Acetaminophen] Nausea And Vomiting   Current Outpatient Prescriptions on File Prior to Visit  Medication Sig Dispense Refill  . albuterol (VENTOLIN HFA) 108 (90 Base) MCG/ACT inhaler Inhale 2 puffs into the lungs every 6 (six) hours as needed for wheezing or shortness of breath. Yearly physical due in June must see MD for refills 54 each 0  . amLODipine (NORVASC) 10 MG tablet TAKE ONE TABLET BY MOUTH ONCE DAILY 90 tablet 3  . apixaban (ELIQUIS) 5 MG TABS tablet Take 1 tablet (5 mg total) by mouth 2 (two) times daily. 60 tablet 11  . Black Cohosh 20 MG TABS Take 40 mg by mouth daily.    . cetirizine (ZYRTEC) 10 MG tablet Take 10 mg by mouth daily.     . Cholecalciferol (VITAMIN D) 2000 units tablet Take 2,000 Units by mouth daily.    . Cyanocobalamin (VITAMELTS ENERGY VITAMIN B-12) 1500 MCG TBDP Take 1,500 mcg by mouth daily.    Marland Kitchen dimenhyDRINATE (DRAMAMINE) 50 MG tablet Take 50 mg by mouth daily as needed for nausea.    Marland Kitchen escitalopram (LEXAPRO) 20 MG tablet Take 20 mg by mouth daily.    Marland Kitchen estradiol (ESTRACE) 2 MG tablet Take 2 mg by mouth daily.    . flecainide (TAMBOCOR) 50 MG tablet TAKE ONE TABLET BY MOUTH TWICE DAILY 180 tablet 3  . furosemide (LASIX) 40 MG tablet TAKE ONE TABLET BY MOUTH ONCE DAILY 90 tablet 2  . Garlic 5400 MG CAPS Take 1,000 mg by mouth at bedtime.     Marland Kitchen guaiFENesin (MUCINEX) 600 MG 12 hr tablet Take 600 mg by mouth 2 (two) times daily as needed for cough or to loosen phlegm.    . irbesartan (AVAPRO) 150 MG tablet TAKE ONE TABLET BY MOUTH ONCE DAILY 90 tablet 3  . LORazepam (ATIVAN) 1 MG tablet TAKE ONE TABLET BY MOUTH AT BEDTIME AS NEEDED FOR ANXIETY 30 tablet 3  . metoprolol tartrate (LOPRESSOR)  50 MG tablet TAKE ONE AND ONE-HALF TABLETS BY MOUTH TWICE DAILY 270 tablet 3  . Omega-3 Fatty Acids (FISH OIL) 1200 MG CAPS Take 1,200 mg by mouth daily.     . pantoprazole (PROTONIX) 40 MG tablet TAKE ONE TABLET BY MOUTH ONCE DAILY 90 tablet 3  . potassium chloride SA (K-DUR,KLOR-CON) 20 MEQ tablet Take 1 tablet (20 mEq total) by mouth daily. 90 tablet 3  . pravastatin (PRAVACHOL) 40 MG tablet TAKE ONE TABLET BY MOUTH ONCE  DAILY 90 tablet 2  . Spacer/Aero-Holding Chambers (AEROCHAMBER MV) inhaler Use as instructed 1 each 0  . Tetrahydrozoline HCl (VISINE OP) Place 1 drop into both eyes daily as needed (dry eyes).    Marland Kitchen umeclidinium-vilanterol (ANORO ELLIPTA) 62.5-25 MCG/INH AEPB Inhale 1 puff into the lungs daily. 1 each 3  . vitamin E 400 UNIT capsule Take 400 Units by mouth daily.     No current facility-administered medications on file prior to visit.    Review of Systems  Constitutional: Negative for other unusual diaphoresis or sweats HENT: Negative for ear discharge or swelling Eyes: Negative for other worsening visual disturbances Respiratory: Negative for stridor or other swelling  Gastrointestinal: Negative for worsening distension or other blood Genitourinary: Negative for retention or other urinary change Musculoskeletal: Negative for other MSK pain or swelling Skin: Negative for color change or other new lesions Neurological: Negative for worsening tremors and other numbness  Psychiatric/Behavioral: Negative for worsening agitation or other fatigue All other system neg per pt    Objective:   Physical Exam BP 110/72   Pulse 61   Ht 5\' 2"  (1.575 m)   Wt 270 lb (122.5 kg)   SpO2 98%   BMI 49.38 kg/m  VS noted,  Constitutional: Pt appears in NAD HENT: Head: NCAT.  Right Ear: External ear normal.  Left Ear: External ear normal.  Eyes: . Pupils are equal, round, and reactive to light. Conjunctivae and EOM are normal Nose: without d/c or deformity Neck: Neck supple.  Gross normal ROM Cardiovascular: Normal rate and regular rhythm.   Pulmonary/Chest: Effort normal and breath sounds without rales or wheezing.  Abd:  Soft, NT, ND, + BS, no organomegaly except for 6 x 3 cm area LLQ bruising with central egg sized oval subq hematoma Neurological: Pt is alert. At baseline orientation, motor grossly intact Skin: Skin is warm. + nontender candida type rashes below breasts and groin areas, other new lesions, no LE edema Psychiatric: Pt behavior is normal without agitation  No other exam findings Lab Results  Component Value Date   WBC 7.5 03/18/2016   HGB 13.1 03/18/2016   HCT 40.1 03/18/2016   PLT 260 03/18/2016   GLUCOSE 116 (H) 09/08/2016   CHOL 219 (H) 10/16/2015   TRIG 275.0 (H) 10/16/2015   HDL 41.20 10/16/2015   LDLDIRECT 141.0 10/16/2015   LDLCALC 50 04/04/2014   ALT 24 10/16/2015   AST 30 10/16/2015   NA 143 09/08/2016   K 4.2 09/08/2016   CL 103 09/08/2016   CREATININE 0.60 09/08/2016   BUN 11 09/08/2016   CO2 25 09/08/2016   TSH 5.08 (H) 10/16/2015   INR 1.00 03/18/2016   HGBA1C 6.0 09/11/2016   MICROALBUR 1.9 10/16/2015       Assessment & Plan:

## 2016-11-03 NOTE — Patient Instructions (Signed)
Please take all new medication as prescribed - the cream or the powder as you need  Please continue all other medications as before, and refills have been done if requested.  Please have the pharmacy call with any other refills you may need.  Please continue your efforts at being more active, low cholesterol diet, and weight control.  Please keep your appointments with your specialists as you may have planned

## 2016-11-04 DIAGNOSIS — T148XXA Other injury of unspecified body region, initial encounter: Secondary | ICD-10-CM | POA: Insufficient documentation

## 2016-11-04 DIAGNOSIS — R21 Rash and other nonspecific skin eruption: Secondary | ICD-10-CM | POA: Insufficient documentation

## 2016-11-04 NOTE — Assessment & Plan Note (Signed)
Relatively small and stable, d/w pt, ok to continue the eliquis, and except healing all bruise and swelling in next 2-4 wks

## 2016-11-04 NOTE — Assessment & Plan Note (Signed)
stable overall by history and exam, recent data reviewed with pt, and pt to continue medical treatment as before,  to f/u any worsening symptoms or concerns  

## 2016-11-04 NOTE — Assessment & Plan Note (Signed)
C/w fungal, Mild to mod, for antibx course,  to f/u any worsening symptoms or concerns

## 2016-11-04 NOTE — Assessment & Plan Note (Signed)
stable overall by history and exam, recent data reviewed with pt, and pt to continue medical treatment as before,  to f/u any worsening symptoms or concerns BP Readings from Last 3 Encounters:  11/03/16 110/72  09/11/16 114/76  09/08/16 138/86

## 2016-11-06 ENCOUNTER — Telehealth: Payer: Self-pay | Admitting: Cardiovascular Disease

## 2016-11-06 NOTE — Telephone Encounter (Signed)
Called pt and left message informing pt that I was leaving samples, 2 weeks supply of Eliquis 5 mg tablet and if pt needed assistant with her medication to call back and let us know, so we can help her.

## 2016-11-06 NOTE — Telephone Encounter (Signed)
New message      Patient calling the office for samples of medication:   1.  What medication and dosage are you requesting samples for?  eliquis 5mg   2.  Are you currently out of this medication? Pt has 1 pill left.  Pt states that she does not have the money to get the presc refilled.

## 2016-11-12 ENCOUNTER — Telehealth: Payer: Self-pay | Admitting: Internal Medicine

## 2016-11-12 MED ORDER — ESTRADIOL 2 MG PO TABS: 2.0000 mg | ORAL_TABLET | Freq: Every day | ORAL | 1 refills | Status: DC

## 2016-11-12 NOTE — Telephone Encounter (Signed)
Done erx 

## 2016-11-12 NOTE — Telephone Encounter (Signed)
Pt called in looking for a refill on her   estradiol (ESTRACE) 2 MG tablet [[id number]]

## 2016-11-12 NOTE — Telephone Encounter (Signed)
Pls advise if ok to refill have not been fill ed by PCP...Andrea Perry

## 2016-11-16 ENCOUNTER — Encounter: Payer: Self-pay | Admitting: Sports Medicine

## 2016-11-16 ENCOUNTER — Ambulatory Visit (INDEPENDENT_AMBULATORY_CARE_PROVIDER_SITE_OTHER): Payer: Medicare Other | Admitting: Sports Medicine

## 2016-11-16 VITALS — BP 134/83 | Ht 62.0 in | Wt 260.0 lb

## 2016-11-16 DIAGNOSIS — M25511 Pain in right shoulder: Secondary | ICD-10-CM

## 2016-11-16 DIAGNOSIS — S76319A Strain of muscle, fascia and tendon of the posterior muscle group at thigh level, unspecified thigh, initial encounter: Secondary | ICD-10-CM

## 2016-11-16 MED ORDER — ESTRADIOL 2 MG PO TABS: 2.0000 mg | ORAL_TABLET | Freq: Every day | ORAL | 1 refills | Status: DC

## 2016-11-16 MED ORDER — METHYLPREDNISOLONE ACETATE 40 MG/ML IJ SUSP
40.0000 mg | Freq: Once | INTRAMUSCULAR | Status: AC
  Administered 2016-11-16: 40 mg via INTRA_ARTICULAR

## 2016-11-16 NOTE — Telephone Encounter (Signed)
Pt states pharmacy does not have this, please resend to the same pharmacy

## 2016-11-16 NOTE — Progress Notes (Signed)
   Subjective:    Patient ID: Andrea Perry, female    DOB: 1950/08/26, 66 y.o.   MRN: [id number]  HPI chief complaint: Right shoulder and right leg pain  Andrea Perry comes in today with a couple of different complaints. She has returning right shoulder pain. She has a documented history of rotator cuff tendinopathy. She is awaiting clearance for surgery. She is requesting a repeat subacromial cortisone injection. These have helped her in the past. She is also complaining of acute right hamstring pain. One week ago she felt a pop while bending over to pick up her dog. Pain is improved but not resolved. She did not notice any swelling. She has not noticed any bruising.    Review of Systems As above    Objective:   Physical Exam  Well-developed, well-nourished. No acute distress. Awake alert and oriented 3. Vital signs reviewed  Right shoulder demonstrates limited range of motion both actively and passively in all planes. She has weakness with rotator cuff stressing. Pain with this as well.  Right hamstring: Slight tenderness to palpation in the mid substance of the hamstring. Small palpable hematoma. No significant swelling. Neurovascular intact distally.      Assessment & Plan:   Right shoulder pain secondary to rotator cuff tendinopathy Acute right hamstring tear  Patient's right subacromial space was injected today with cortisone. She tolerated this without difficulty. Follow-up with surgery once cleared medically. Reassurance regarding her right hamstring injury. She understands that it may be sore for several weeks but it should eventually heal. She will let me know if symptoms persist or worsen. Follow-up as needed.  Consent obtained and verified. Time-out conducted. Noted no overlying erythema, induration, or other signs of local infection. Skin prepped in a sterile fashion. Topical analgesic spray: Ethyl chloride. Joint: right shoulder Needle: 25g 1.5 inch Completed without  difficulty. Meds: 3cc 1% xylocaine, 1cc (40mg ) depomedrol  Advised to call if fevers/chills, erythema, induration, drainage, or persistent bleeding.

## 2016-11-16 NOTE — Telephone Encounter (Signed)
Per chart MD sent rx on 11/12/16 electronically and we received confirmation (see below), but will resend to [facility] again.../lmb estradiol (ESTRACE) 2 MG tablet 90 tablet 1 11/12/2016    Sig - Route: Take 1 tablet (2 mg total) by mouth daily. - Oral   Sent to pharmacy as: estradiol (ESTRACE) 2 MG tablet   E-Prescribing Status: Receipt confirmed by pharmacy (11/12/2016 6:13 PM EDT

## 2016-11-20 ENCOUNTER — Telehealth: Payer: Self-pay | Admitting: Cardiovascular Disease

## 2016-11-20 NOTE — Telephone Encounter (Signed)
Called pt to inform her that I was leaving 3 weeks supply of samples of Eliquis 5 mg tablet at the front desk for pt to pick up and assistance form for pt to fill out and if she could please return the forms as soon as possible, so we could try and get her some help. Pt verbalized understanding.

## 2016-11-20 NOTE — Telephone Encounter (Signed)
Will forward to per auth nurse to see if she can help patient with her eliguis. Patient stated she only has one pill left and would need some samples as well.

## 2016-11-20 NOTE — Telephone Encounter (Signed)
New message    Pt is calling asking for a call back. She said she was told to call back for assistance with her medication. Please call.

## 2016-12-06 ENCOUNTER — Other Ambulatory Visit: Payer: Self-pay | Admitting: Internal Medicine

## 2016-12-14 ENCOUNTER — Telehealth: Payer: Self-pay | Admitting: Cardiovascular Disease

## 2016-12-14 NOTE — Telephone Encounter (Signed)
New message      Patient calling the office for samples of medication:   1.  What medication and dosage are you requesting samples for?  eliquis 5mg  2.  Are you currently out of this medication? Have 1 days worth Pt lost the "papers" to get assistance with her eliquis.  Please put another copy of the papers in the bag with the samples.  If we do not have any samples, please let pt know

## 2016-12-15 NOTE — Telephone Encounter (Signed)
Called pt and left message informing pt that we do not have any samples of Eliquis at this time, but I was leaving a copy of the assistance forms at the front desk for pt to fill out and return to the office to be able to get assistance for Eliquis 5 mg tablet and she could call back later in the week to see if we have gotten any samples of Eliquis 5 mg tablets in and if she has any other problems, questions or concerns to call the office.

## 2016-12-17 ENCOUNTER — Telehealth: Payer: Self-pay | Admitting: Cardiovascular Disease

## 2016-12-17 NOTE — Telephone Encounter (Signed)
New message ° ° ° ° ° ° °Patient calling the office for samples of medication: ° ° °1.  What medication and dosage are you requesting samples for? eliquis 5mg ° °2.  Are you currently out of this medication? Almost out ° ° °

## 2016-12-17 NOTE — Telephone Encounter (Signed)
Called pt to inform him that I was leaving 2 weeks supply of Eliquis 5 mg tablet and assistance forms to fill out ASAP and bring back in order to get assistance with getting medication. Pt was made aware that if she did not fill out forms and bring back that it would be hard to continue to get samples. Pt verbalized understanding.

## 2017-01-12 ENCOUNTER — Encounter: Payer: Self-pay | Admitting: Emergency Medicine

## 2017-01-12 ENCOUNTER — Ambulatory Visit (INDEPENDENT_AMBULATORY_CARE_PROVIDER_SITE_OTHER): Payer: Medicare Other | Admitting: Emergency Medicine

## 2017-01-12 VITALS — BP 126/82 | HR 60 | Ht 62.0 in | Wt 271.0 lb

## 2017-01-12 DIAGNOSIS — Z23 Encounter for immunization: Secondary | ICD-10-CM | POA: Diagnosis not present

## 2017-01-12 DIAGNOSIS — J449 Chronic obstructive pulmonary disease, unspecified: Secondary | ICD-10-CM | POA: Diagnosis not present

## 2017-01-12 MED ORDER — IPRATROPIUM-ALBUTEROL 0.5-2.5 (3) MG/3ML IN SOLN: 3.0000 mL | Freq: Four times a day (QID) | RESPIRATORY_TRACT | 5 refills | Status: DC

## 2017-01-12 NOTE — Assessment & Plan Note (Signed)
She states that she did feel a benefit on the Anoro. She took for 2 months. Unfortunately she is unable to afford long-term. We talked about options. I will order DuoNeb 4 times a day to be her new maintenance. She will use albuterol as needed as well. Flu shot today. He needs a repeat walking oximetry. We will do this next time once she is on scheduled therapy.

## 2017-01-12 NOTE — Assessment & Plan Note (Signed)
Continue scheduled Zyrtec

## 2017-01-12 NOTE — Patient Instructions (Signed)
We will start DuoNeb (albuterol/ipratropium) nebulizer 4 times a day. Use albuterol 2 puffs up to every 4 hours if needed for shortness of breath Flu shot today Continue Zyrtec once a day Follow with Dr Lamonte Sakai in 6 months or sooner if you have any problems

## 2017-01-12 NOTE — Assessment & Plan Note (Signed)
Not currently on therapy 

## 2017-01-12 NOTE — Progress Notes (Signed)
Subjective:    Patient ID: Andrea Perry, female    DOB: 06-21-1950, 66 y.o.   MRN: [id number]  HPI 66 yo woman, hx of childhood asthma, hx tobacco (45 pk-yrs), allergies, GERD, carries a dx of COPD and adult asthma made by Dr Jenny Reichmann. She has worked in a [employer] and had to have spirometry as a part of her job. She is referred today for her COPD and also for pre-op eval for L knee surgery w Dr Noemi Chapel. She describes some exertional dyspnea, happens after 37ft. She has some occasional cough, non-productive. She sometimes hears wheeze. She has had episodes of light-headedness when walking an extended distance.   ROV 06/24/16 -- patient has a history of COPD with positive bronchodilator response, cotton dust exposure. Also with mild obstructive sleep apnea, not on CPAP. She reports that she has been having exertional dyspnea. She switched back to Advair temporarily due to cost. She is having some wheeze. Occasional dry cough. She is on zyrtec. Uses albuterol several times a week. has a history of COPD with positive bronchodilator response, cotton dust exposure. Also with mild obstructive sleep apnea, not on CPAP. She reports that she has been having exertional dyspnea. She switched back to Advair temporarily due to cost. She is having some wheeze. Occasional dry cough. She is on zyrtec. Uses albuterol several times a week.   ROV 12/12/16 -- This is a follow-up visit for patient with a history of COPD and long-standing asthma with associated chronic obstruction. Also with obstructive sleep apnea not currently on CPAP. Last time we started Anoro, she took it for a couple months and did benefit, but it is too expensive.  She was also unable to afford Advair. She is taking zyrtec. She has albuterol, uses about once a day. She has daily cough, usually in the am. Occasional wheezing.   Review of Systems  Constitutional: Negative for fever and unexpected weight change.  HENT: Negative for congestion, dental problem, ear pain, nosebleeds, postnasal drip, rhinorrhea, sinus pressure, sneezing, sore throat and trouble swallowing.   Eyes: Negative for redness and itching.  Respiratory: Positive for cough and shortness of breath. Negative for chest tightness and wheezing.   Cardiovascular: Negative for palpitations and leg swelling.  Gastrointestinal:  Negative for nausea and vomiting.  Genitourinary: Negative for dysuria.  Musculoskeletal: Negative for joint swelling.  Skin: Negative for rash.  Neurological: Negative for headaches.  Hematological: Does not bruise/bleed easily.  Psychiatric/Behavioral: Negative for dysphoric mood. The patient is not nervous/anxious.        Objective:   Physical Exam Vitals:   01/12/17 1113  BP: 126/82  Pulse: 60  SpO2: 96%  Weight: 271 lb (122.9 kg)  Height: 5\' 2"  (1.575 m)   Gen: Pleasant, overwt,  in no distress,  normal affect  ENT: No lesions,  mouth clear,  oropharynx clear, no postnasal drip  Neck: No JVD, no TMG, no carotid bruits  Lungs: No use of accessory muscles, clear, distant at both bases  Cardiovascular: RRR, heart sounds normal, no murmur or gallops, no peripheral edema  Musculoskeletal: No deformities, no cyanosis or clubbing  Neuro: alert, non focal  Skin: Warm, no lesions or rashes      Assessment & Plan:  COPD (chronic obstructive pulmonary disease) She states that she did feel a benefit on the Anoro. She took for 2 months. Unfortunately she is unable to afford long-term. We talked about options. I will order DuoNeb 4 times a day to be her new maintenance. She will use albuterol as needed as well. Flu shot today. He needs a repeat walking oximetry. We will do this next time once she is on scheduled therapy.  Allergic rhinitis Continue scheduled Zyrtec  Mild obstructive sleep apnea Not currently on therapy  Baltazar Apo, MD, PhD 01/12/2017, 11:37 AM [facility] [phone] or if no answer [phone]

## 2017-01-13 ENCOUNTER — Other Ambulatory Visit: Payer: Self-pay

## 2017-01-13 MED ORDER — AMLODIPINE BESYLATE 10 MG PO TABS: 10.0000 mg | ORAL_TABLET | Freq: Every day | ORAL | 3 refills | Status: DC

## 2017-01-13 MED ORDER — FLECAINIDE ACETATE 50 MG PO TABS: 50.0000 mg | ORAL_TABLET | Freq: Two times a day (BID) | ORAL | 3 refills | Status: DC

## 2017-01-13 MED ORDER — METOPROLOL TARTRATE 50 MG PO TABS: 75.0000 mg | ORAL_TABLET | Freq: Two times a day (BID) | ORAL | 3 refills | Status: DC

## 2017-01-18 ENCOUNTER — Other Ambulatory Visit: Payer: Self-pay

## 2017-01-18 MED ORDER — KETOCONAZOLE 2 % EX CREA: 1.0000 "application " | TOPICAL_CREAM | Freq: Every day | CUTANEOUS | 0 refills | Status: DC

## 2017-01-18 MED ORDER — ESCITALOPRAM OXALATE 20 MG PO TABS: 20.0000 mg | ORAL_TABLET | Freq: Every day | ORAL | 3 refills | Status: DC

## 2017-01-18 MED ORDER — ESTRADIOL 2 MG PO TABS: 2.0000 mg | ORAL_TABLET | Freq: Every day | ORAL | 0 refills | Status: DC

## 2017-01-18 MED ORDER — PRAVASTATIN SODIUM 40 MG PO TABS: 40.0000 mg | ORAL_TABLET | Freq: Every day | ORAL | 0 refills | Status: DC

## 2017-01-18 MED ORDER — FUROSEMIDE 40 MG PO TABS: 40.0000 mg | ORAL_TABLET | Freq: Every day | ORAL | 0 refills | Status: DC

## 2017-01-18 MED ORDER — LORAZEPAM 1 MG PO TABS: ORAL_TABLET | ORAL | 5 refills | Status: DC

## 2017-01-18 MED ORDER — PANTOPRAZOLE SODIUM 40 MG PO TBEC
40.0000 mg | DELAYED_RELEASE_TABLET | Freq: Every day | ORAL | 0 refills | Status: DC
Start: 2017-01-18 — End: 2017-03-25

## 2017-01-18 MED ORDER — POTASSIUM CHLORIDE CRYS ER 20 MEQ PO TBCR: 20.0000 meq | EXTENDED_RELEASE_TABLET | Freq: Every day | ORAL | 0 refills | Status: DC

## 2017-01-18 MED ORDER — ALBUTEROL SULFATE HFA 108 (90 BASE) MCG/ACT IN AERS: 2.0000 | INHALATION_SPRAY | Freq: Four times a day (QID) | RESPIRATORY_TRACT | 0 refills | Status: DC | PRN

## 2017-01-18 MED ORDER — GUAIFENESIN ER 600 MG PO TB12: 600.0000 mg | ORAL_TABLET | Freq: Two times a day (BID) | ORAL | 2 refills | Status: DC | PRN

## 2017-01-18 MED ORDER — IRBESARTAN 150 MG PO TABS
150.0000 mg | ORAL_TABLET | Freq: Every day | ORAL | 0 refills | Status: DC
Start: 2017-01-18 — End: 2018-01-17

## 2017-01-18 NOTE — Telephone Encounter (Signed)
Faxed

## 2017-01-18 NOTE — Telephone Encounter (Signed)
Done hardcopy to Shirron  

## 2017-02-01 ENCOUNTER — Other Ambulatory Visit: Payer: Self-pay | Admitting: Internal Medicine

## 2017-02-01 DIAGNOSIS — J449 Chronic obstructive pulmonary disease, unspecified: Secondary | ICD-10-CM | POA: Diagnosis not present

## 2017-02-01 MED ORDER — LORAZEPAM 1 MG PO TABS: ORAL_TABLET | ORAL | 1 refills | Status: DC

## 2017-02-01 NOTE — Telephone Encounter (Signed)
Done hardcopy to Shirron  

## 2017-02-02 DIAGNOSIS — J449 Chronic obstructive pulmonary disease, unspecified: Secondary | ICD-10-CM | POA: Diagnosis not present

## 2017-02-02 NOTE — Telephone Encounter (Signed)
faxed

## 2017-02-15 NOTE — Progress Notes (Signed)
Cardiology Office Note   Date:  02/16/2017   ID:  Andrea Perry, DOB [birth year], MRN [id number]  PCP:  Biagio Borg, MD  Cardiologist:  Dr. Johnsie Cancel   Sleep Dr. Radford Pax   No chief complaint on file.     History of Present Illness: [name] is a 66 y.o. female who presents for PAF on Eliquis, pulmonary HTN, HTN, morbid obesity COPD and HLD   Last seen 09/2015      Failed DCCV on 03/20/14 started on flecainide. She was unable to do ETT due to arthritis. She did do a Lexiscan1/2016 which showed no ischemia or infarction. EF was 40% but follow up MUGA test showed normal EF of 52%. She continues on Flecainide 50mg  BID. She underwent successful DCCV on 10/30/14 and done well since that time.  Neg sleep study in 2016.  One hospitalization for chest pain in 2017 neg for MI.    Seen by PA in May and was in afib. Rates ok and needed shoulder surgery with Dr Micheline Chapman at [facility] So no attempts made at [facility]:  Also seeing [facility] for back wound from lumbar decompression for spinal Stenosis 02/2016  On disability for this  She is trying to put off shoulder surgery No plans currently. She has not been taking eliquis cannot afford Worried about her carotids father had stroke     Past Medical History:  Diagnosis Date  . ALLERGIC RHINITIS 08/16/2008  . ANXIETY 08/16/2008  . Arthritis    hands, knees, lower back  . ASTHMA 08/16/2008  . Asthma    BRONCHITIS     DR. Lamonte Sakai    . Bladder leak   . Cancer ([place])    MELANOMA      . Chronic lower back pain    problems with disc L2-5  . COPD 08/16/2008  . DEPRESSION 08/16/2008  . Dysrhythmia    hx AF  . Excessive daytime sleepiness 06/19/2015  . GENITAL HERPES 12/03/2006  . GERD (gastroesophageal reflux disease)   . H/O hiatal hernia   . HEMATOCHEZIA 06/20/2009  . Hemorrhoids   . History of cardioversion 10/30/14  . History of kidney stones   . HYPERLIPIDEMIA 08/16/2008  . HYPERTENSION 12/03/2006  . Hypertension   . Impaired glucose tolerance  09/21/2010  . Overactive bladder 10/20/2015  . Pneumonia    as a child  . Pulmonary HTN ([place]) 06/19/2015  . Shortness of breath dyspnea   . Sleep apnea    MILD NO CPAP ORDERED  . Snoring 06/19/2015    Past Surgical History:  Procedure Laterality Date  . ABDOMINAL HYSTERECTOMY    . APPENDECTOMY    . CARDIOVERSION N/A 03/20/2014   Procedure: CARDIOVERSION;  Surgeon: Josue Hector, MD;  Location: [facility] ENDOSCOPY;  Service: Cardiovascular;  Laterality: N/A;  . CARDIOVERSION N/A 10/30/2014   Procedure: CARDIOVERSION;  Surgeon: Josue Hector, MD;  Location: [facility] ENDOSCOPY;  Service: Cardiovascular;  Laterality: N/A;  . CARPAL TUNNEL RELEASE     Right + LEFT  . CESAREAN SECTION     x 1   . COLONOSCOPY  2004  . CYST EXCISION     RT ARM   . CYSTOCELE REPAIR N/A 10/25/2012   Procedure: ANTERIOR REPAIR (CYSTOCELE);  Surgeon: Gus Height, MD;  Location: [facility];  Service: Gynecology;  Laterality: N/A;  . HEEL SPUR SURGERY Bilateral   . KNEE SURGERY     Right + LEFT  . LUMBAR LAMINECTOMY/DECOMPRESSION MICRODISCECTOMY N/A 03/19/2016   Procedure: Laminectomy  and Foraminotomy - Lumbar three-four - Lumbar four-five;  Surgeon: Eustace Moore, MD;  Location: [facility];  Service: Neurosurgery;  Laterality: N/A;  . RADIOLOGY WITH ANESTHESIA Right 11/01/2014   Procedure: MRI RIGHT FOREARM;  Surgeon: Medication Radiologist, MD;  Location: [facility];  Service: Radiology;  Laterality: Right;  . RADIOLOGY WITH ANESTHESIA Right 08/29/2015   Procedure: MRI - RIGHT FOREARM;  Surgeon: Medication Radiologist, MD;  Location: [facility];  Service: Radiology;  Laterality: Right;  . RADIOLOGY WITH ANESTHESIA N/A 12/05/2015   Procedure: MRI SPINE WITHOUT;  Surgeon: Medication Radiologist, MD;  Location: [place];  Service: Radiology;  Laterality: N/A;  . SKIN CANCER EXCISION     BILAT SHOULDERS  . SPLIT NIGHT STUDY  09/04/2015  . TONSILLECTOMY AND ADENOIDECTOMY       Current Outpatient Prescriptions  Medication Sig Dispense Refill  .  albuterol (VENTOLIN HFA) 108 (90 Base) MCG/ACT inhaler Inhale 2 puffs into the lungs every 6 (six) hours as needed for wheezing or shortness of breath. Yearly physical due in June must see MD for refills 54 each 0  . amLODipine (NORVASC) 10 MG tablet Take 1 tablet (10 mg total) by mouth daily. 90 tablet 3  . apixaban (ELIQUIS) 5 MG TABS tablet Take 1 tablet (5 mg total) by mouth 2 (two) times daily. 60 tablet 11  . Black Cohosh 20 MG TABS Take 40 mg by mouth daily.    . cetirizine (ZYRTEC) 10 MG tablet Take 10 mg by mouth daily.     . Cholecalciferol (VITAMIN D) 2000 units tablet Take 2,000 Units by mouth daily.    . Cyanocobalamin (VITAMELTS ENERGY VITAMIN B-12) 1500 MCG TBDP Take 1,500 mcg by mouth daily.    Marland Kitchen dimenhyDRINATE (DRAMAMINE) 50 MG tablet Take 50 mg by mouth daily as needed for nausea.    Marland Kitchen escitalopram (LEXAPRO) 20 MG tablet Take 1 tablet (20 mg total) by mouth daily. 9000 tablet 3  . estradiol (ESTRACE) 2 MG tablet Take 1 tablet (2 mg total) by mouth daily. 90 tablet 0  . flecainide (TAMBOCOR) 50 MG tablet Take 1 tablet (50 mg total) by mouth 2 (two) times daily. 180 tablet 3  . furosemide (LASIX) 40 MG tablet Take 1 tablet (40 mg total) by mouth daily. 90 tablet 0  . Garlic 8119 MG CAPS Take 1,000 mg by mouth at bedtime.     Marland Kitchen guaiFENesin (MUCINEX) 600 MG 12 hr tablet Take 1 tablet (600 mg total) by mouth 2 (two) times daily as needed for cough or to loosen phlegm. 60 tablet 2  . ipratropium-albuterol (DUONEB) 0.5-2.5 (3) MG/3ML SOLN Take 3 mLs by nebulization 4 (four) times daily. 120 mL 5  . irbesartan (AVAPRO) 150 MG tablet Take 1 tablet (150 mg total) by mouth daily. 90 tablet 0  . ketoconazole (NIZORAL) 2 % cream Apply 1 application topically daily. 15 g 0  . LORazepam (ATIVAN) 1 MG tablet TAKE ONE TABLET BY MOUTH AT BEDTIME AS NEEDED FOR ANXIETY 90 tablet 1  . metoprolol tartrate (LOPRESSOR) 50 MG tablet Take 1.5 tablets (75 mg total) by mouth 2 (two) times daily. 270 tablet 3   . nystatin (MYCOSTATIN/NYSTOP) powder Use as directed twice daily as needed 30 g 1  . Omega-3 Fatty Acids (FISH OIL) 1200 MG CAPS Take 1,200 mg by mouth daily.     . pantoprazole (PROTONIX) 40 MG tablet Take 1 tablet (40 mg total) by mouth daily. 90 tablet 0  . potassium chloride SA (K-DUR,KLOR-CON) 20  MEQ tablet Take 1 tablet (20 mEq total) by mouth daily. 90 tablet 0  . pravastatin (PRAVACHOL) 40 MG tablet Take 1 tablet (40 mg total) by mouth daily. 90 tablet 0  . Spacer/Aero-Holding Chambers (AEROCHAMBER MV) inhaler Use as instructed 1 each 0  . Tetrahydrozoline HCl (VISINE OP) Place 1 drop into both eyes daily as needed (dry eyes).    . vitamin E 400 UNIT capsule Take 400 Units by mouth daily.     No current facility-administered medications for this visit.     Allergies:   Tramadol hcl; Codeine; Tape; and Vicodin [hydrocodone-acetaminophen]    Social History:  The patient  reports that she quit smoking about 13 years ago. Her smoking use included Cigarettes. She has a 45.00 pack-year smoking history. She has never used smokeless tobacco. She reports that she drinks alcohol. She reports that she does not use drugs.   Family History:  The patient's family history includes Colon polyps in her mother; Diabetes in her mother; Hyperlipidemia in her mother; Hypertension in her mother; Stroke in her father.    ROS:  General:no colds or fevers, wt is up 15 lbs. In a month.  Skin:no rashes or ulcers HEENT:no blurred vision, no congestion CV:see HPI PUL:see HPI GI:no diarrhea constipation or melena, no indigestion GU:no hematuria, no dysuria MS:+ joint pain of Rt shoulder and Rt knee, no claudication Neuro:no syncope, no lightheadedness Endo:no diabetes, no thyroid disease  Wt Readings from Last 3 Encounters:  02/16/17 265 lb 9.6 oz (120.5 kg)  02/16/17 260 lb (117.9 kg)  01/12/17 271 lb (122.9 kg)     PHYSICAL EXAM: VS:  BP 118/76   Pulse (!) 130   Ht 5\' 2"  (1.575 m)   Wt 265 lb  9.6 oz (120.5 kg)   SpO2 96%   BMI 48.58 kg/m  , BMI Body mass index is 48.58 kg/m. Affect appropriate Chronically ill female  HEENT: normal Neck supple with no adenopathy JVP normal no bruits no thyromegaly Lungs clear with no wheezing and good diaphragmatic motion Heart:  S1/S2 no murmur, no rub, gallop or click PMI normal Abdomen: benighn, BS positve, no tenderness, no AAA no bruit.  No HSM or HJR Distal pulses intact with no bruits Plus one bilateral  edema Neuro non-focal Skin warm and dry No muscular weakness     EKG:  09/08/16  a fib non specific T wave abnormality though no acute changes from 02/2017.     Recent Labs: 03/18/2016: Hemoglobin 13.1; Platelets 260 09/08/2016: BUN 11; Creatinine, Ser 0.60; NT-Pro BNP 919; Potassium 4.2; Sodium 143    Lipid Panel    Component Value Date/Time   CHOL 219 (H) 10/16/2015 1437   TRIG 275.0 (H) 10/16/2015 1437   HDL 41.20 10/16/2015 1437   CHOLHDL 5 10/16/2015 1437   VLDL 55.0 (H) 10/16/2015 1437   LDLCALC 50 04/04/2014 1158   LDLDIRECT 141.0 10/16/2015 1437       Other studies Reviewed: Additional studies/ records that were reviewed today include: . Echo 01/2014  Study Conclusions  - Left ventricle: Inferobasal hypokinesis. The cavity size was normal. Wall thickness was normal. Systolic function was normal. The estimated ejection fraction was in the range of 50% to 55%. - Left atrium: The atrium was mildly dilated. - Atrial septum: No defect or patent foramen ovale was identified. - Pulmonary arteries: PA peak pressure: 41 mm Hg (S).  nuc study 04/2014  Overall Impression:  Low risk stress nuclear study with small size and mild  intensity fixed distal anteroapical breast attenuation artifact.  LV Ejection Fraction: 44%.  LV Wall Motion:  Mild global hypokinesis    ASSESSMENT AND PLAN:  1.  Persistent a fib, most likely since 10/2016. Not taking anticoagulation Fairly asymptomatic will try to get her  on something Affordable but may end up with rate control and no DCC since she is not reliably anticoagulated    2. Lower ext edema - stable continue current dose lasix from obesity and venous disease   3. anticoagulation refer pharmacy non compliant due to cost   4. HTN Well controlled.  Continue current medications and low sodium Dash type diet.    5. HLD continue statin labs ordered in May not done    6. COPD: 27 packyear smoking history with asthma and [place] exposure F/U Dr Lamonte Sakai.   7. Ortho:  Putting off shoulder surgery f/u [facility]   8. Carotid: [place] ordered father with >90% blockage and stroke patient concerned    Jenkins Rouge, MD

## 2017-02-16 ENCOUNTER — Telehealth: Payer: Self-pay

## 2017-02-16 ENCOUNTER — Ambulatory Visit (INDEPENDENT_AMBULATORY_CARE_PROVIDER_SITE_OTHER): Payer: Medicare Other | Admitting: Cardiovascular Disease

## 2017-02-16 ENCOUNTER — Other Ambulatory Visit: Payer: Self-pay

## 2017-02-16 ENCOUNTER — Encounter: Payer: Self-pay | Admitting: Cardiovascular Disease

## 2017-02-16 ENCOUNTER — Encounter (INDEPENDENT_AMBULATORY_CARE_PROVIDER_SITE_OTHER): Payer: Self-pay

## 2017-02-16 ENCOUNTER — Ambulatory Visit (INDEPENDENT_AMBULATORY_CARE_PROVIDER_SITE_OTHER): Payer: Medicare Other | Admitting: Sports Medicine

## 2017-02-16 VITALS — BP 118/76 | HR 130 | Ht 62.0 in | Wt 265.6 lb

## 2017-02-16 VITALS — BP 110/56 | Ht 62.0 in | Wt 260.0 lb

## 2017-02-16 DIAGNOSIS — I481 Persistent atrial fibrillation: Secondary | ICD-10-CM

## 2017-02-16 DIAGNOSIS — M170 Bilateral primary osteoarthritis of knee: Secondary | ICD-10-CM | POA: Diagnosis not present

## 2017-02-16 DIAGNOSIS — Z823 Family history of stroke: Secondary | ICD-10-CM

## 2017-02-16 DIAGNOSIS — I10 Essential (primary) hypertension: Secondary | ICD-10-CM | POA: Diagnosis not present

## 2017-02-16 DIAGNOSIS — I4819 Other persistent atrial fibrillation: Secondary | ICD-10-CM

## 2017-02-16 MED ORDER — METHYLPREDNISOLONE ACETATE 40 MG/ML IJ SUSP
40.0000 mg | Freq: Once | INTRAMUSCULAR | Status: AC
  Administered 2017-02-16: 40 mg via INTRA_ARTICULAR

## 2017-02-16 MED ORDER — APIXABAN 5 MG PO TABS: 5.0000 mg | ORAL_TABLET | Freq: Two times a day (BID) | ORAL | 3 refills | Status: DC

## 2017-02-16 NOTE — Patient Instructions (Addendum)
Medication Instructions:  Your physician recommends that you continue on your current medications as directed. Please refer to the Current Medication list given to you today.  Labwork: NONE  Testing/Procedures: Your physician has requested that you have a carotid duplex. This test is an ultrasound of the carotid arteries in your neck. It looks at blood flow through these arteries that supply the brain with blood. Allow one hour for this exam. There are no restrictions or special instructions.  Follow-Up: Your physician wants you to follow-up in: 6 months with Dr. Nishan. You will receive a reminder letter in the mail two months in advance. If you don't receive a letter, please call our office to schedule the follow-up appointment.   If you need a refill on your cardiac medications before your next appointment, please call your pharmacy.    

## 2017-02-16 NOTE — Telephone Encounter (Signed)
I have completed the provider part of the [place] pt assistance application, Dr Johnsie Cancel has signed the application and Eliquis RX. I have placed in the yellow folder in PA Dept awaiting the pts part of application.

## 2017-02-16 NOTE — Telephone Encounter (Signed)
Patient given assistance forms and eliquis samples at office visit today. Will forward to pre auth nurse.

## 2017-02-17 NOTE — Progress Notes (Signed)
   Subjective:    Patient ID: Andrea Perry, female    DOB: 1950/07/15, 66 y.o.   MRN: [id number]  HPI chief complaint: Bilateral knee pain  Patient comes in today complaining of bilateral knee pain. She has a known history of knee DJD. She's had cortisone shots in the past with good symptom relief. Current pain started about 3 days ago. Right knee is worse than the left. Per her report, she has had updated imaging at a local orthopedic office. She was told that she has bone-on-bone arthritis in the left knee. She is not ready to proceed with total knee arthroplasty yet. She denies any trauma. She does get intermittent swelling. She has a walker at home to help assist with ambulation as needed.    Review of Systems As above    Objective:   Physical Exam  Obese. No acute distress. Awake alert and oriented 3. Vital signs reviewed  Examination of each knee shows range of motion from 0-100. 1+ boggy synovitis. She is tender to palpation along the medial joint lines bilaterally, right greater than left. No tenderness along the lateral joint line. Pain but no popping with McMurray's bilaterally. Good ligament stability. Neurovascularly intact distally. Walking with an obvious limp.      Assessment & Plan:   Bilateral knee pain secondary to DJD  Each of the patient's knees were injected with cortisone. An anterior lateral approach was utilized. Patient tolerated this without difficulty. Slowly increase activity as tolerated. I recommended Tylenol arthritis as needed for pain. She can take an occasional ibuprofen as well but I do not want her taking that on a regular basis. Follow-up as needed.  Consent obtained and verified. Time-out conducted. Noted no overlying erythema, induration, or other signs of local infection. Skin prepped in a sterile fashion. Topical analgesic spray: Ethyl chloride. Joint: right kee Needle: 25g 1.5 inch Completed without difficulty. Meds: 3cc 1% xylocaine,  1cc (40mg ) depomedrol  Advised to call if fevers/chills, erythema, induration, drainage, or persistent bleeding.  Consent obtained and verified. Time-out conducted. Noted no overlying erythema, induration, or other signs of local infection. Skin prepped in a sterile fashion. Topical analgesic spray: Ethyl chloride. Joint: left knee Needle: 25g 1.5 inch Completed without difficulty. Meds: 3cc 1% xylocaine, 1cc (40mg ) depomedrol  Advised to call if fevers/chills, erythema, induration, drainage, or persistent bleeding.

## 2017-02-18 ENCOUNTER — Ambulatory Visit: Payer: Medicare Other | Admitting: Sports Medicine

## 2017-02-20 DIAGNOSIS — J449 Chronic obstructive pulmonary disease, unspecified: Secondary | ICD-10-CM | POA: Diagnosis not present

## 2017-03-03 ENCOUNTER — Encounter (HOSPITAL_COMMUNITY): Payer: Medicare Other

## 2017-03-12 ENCOUNTER — Ambulatory Visit: Payer: Medicare Other | Admitting: Family Medicine

## 2017-03-15 DIAGNOSIS — J449 Chronic obstructive pulmonary disease, unspecified: Secondary | ICD-10-CM | POA: Diagnosis not present

## 2017-03-17 ENCOUNTER — Ambulatory Visit (INDEPENDENT_AMBULATORY_CARE_PROVIDER_SITE_OTHER): Payer: Medicare Other | Admitting: Internal Medicine

## 2017-03-17 ENCOUNTER — Encounter: Payer: Self-pay | Admitting: Internal Medicine

## 2017-03-17 ENCOUNTER — Other Ambulatory Visit (INDEPENDENT_AMBULATORY_CARE_PROVIDER_SITE_OTHER): Payer: Medicare Other

## 2017-03-17 VITALS — BP 132/88 | HR 82 | Temp 98.4°F | Ht 62.0 in | Wt 272.0 lb

## 2017-03-17 DIAGNOSIS — I10 Essential (primary) hypertension: Secondary | ICD-10-CM | POA: Diagnosis not present

## 2017-03-17 DIAGNOSIS — Z114 Encounter for screening for human immunodeficiency virus [HIV]: Secondary | ICD-10-CM | POA: Diagnosis not present

## 2017-03-17 DIAGNOSIS — J301 Allergic rhinitis due to pollen: Secondary | ICD-10-CM | POA: Diagnosis not present

## 2017-03-17 DIAGNOSIS — G4700 Insomnia, unspecified: Secondary | ICD-10-CM | POA: Diagnosis not present

## 2017-03-17 DIAGNOSIS — Z0001 Encounter for general adult medical examination with abnormal findings: Secondary | ICD-10-CM | POA: Diagnosis not present

## 2017-03-17 DIAGNOSIS — E119 Type 2 diabetes mellitus without complications: Secondary | ICD-10-CM

## 2017-03-17 LAB — LIPID PANEL
Cholesterol: 122 mg/dL (ref 0–200)
HDL: 32.8 mg/dL — AB (ref >39.00)
LDL Cholesterol: 49 mg/dL (ref 0–99)
NONHDL: 89.06
TRIGLYCERIDES: 199 mg/dL — AB (ref 0.0–149.0)
Total CHOL/HDL Ratio: 4
VLDL: 39.8 mg/dL (ref 0.0–40.0)

## 2017-03-17 LAB — BASIC METABOLIC PANEL
BUN: 15 mg/dL (ref 6–23)
CALCIUM: 8.5 mg/dL (ref 8.4–10.5)
CO2: 32 mEq/L (ref 19–32)
Chloride: 100 mEq/L (ref 96–112)
Creatinine, Ser: 0.66 mg/dL (ref 0.40–1.20)
GFR: 95.77 mL/min (ref >60.00)
GLUCOSE: 159 mg/dL — AB (ref 70–99)
POTASSIUM: 3.5 meq/L (ref 3.5–5.1)
SODIUM: 142 meq/L (ref 135–145)

## 2017-03-17 LAB — CBC WITH DIFFERENTIAL/PLATELET
BASOS PCT: 0.6 % (ref 0.0–3.0)
Basophils Absolute: 0.1 10*3/uL (ref 0.0–0.1)
EOS PCT: 3.1 % (ref 0.0–5.0)
Eosinophils Absolute: 0.3 10*3/uL (ref 0.0–0.7)
HCT: 40.3 % (ref 36.0–46.0)
HEMOGLOBIN: 12.8 g/dL (ref 12.0–15.0)
LYMPHS ABS: 2.6 10*3/uL (ref 0.7–4.0)
Lymphocytes Relative: 24.9 % (ref 12.0–46.0)
MCHC: 31.8 g/dL (ref 30.0–36.0)
MCV: 94.2 fl (ref 78.0–100.0)
MONO ABS: 0.6 10*3/uL (ref 0.1–1.0)
MONOS PCT: 5.9 % (ref 3.0–12.0)
NEUTROS PCT: 65.5 % (ref 43.0–77.0)
Neutro Abs: 6.7 10*3/uL (ref 1.4–7.7)
Platelets: 283 10*3/uL (ref 150.0–400.0)
RBC: 4.28 Mil/uL (ref 3.87–5.11)
RDW: 14.3 % (ref 11.5–15.5)
WBC: 10.2 10*3/uL (ref 4.0–10.5)

## 2017-03-17 LAB — URINALYSIS, ROUTINE W REFLEX MICROSCOPIC
HGB URINE DIPSTICK: NEGATIVE
LEUKOCYTES UA: NEGATIVE
Nitrite: NEGATIVE
RBC / HPF: NONE SEEN (ref 0–?)
Specific Gravity, Urine: >=1.03 — AB (ref 1.000–1.030)
Total Protein, Urine: >=300 — AB
Urine Glucose: NEGATIVE
Urobilinogen, UA: 0.2 (ref 0.0–1.0)
pH: 6 (ref 5.0–8.0)

## 2017-03-17 LAB — HEPATIC FUNCTION PANEL
ALBUMIN: 3.7 g/dL (ref 3.5–5.2)
ALT: 9 U/L (ref 0–35)
AST: 10 U/L (ref 0–37)
Alkaline Phosphatase: 63 U/L (ref 39–117)
BILIRUBIN TOTAL: 0.7 mg/dL (ref 0.2–1.2)
Bilirubin, Direct: 0.1 mg/dL (ref 0.0–0.3)
Total Protein: 6.5 g/dL (ref 6.0–8.3)

## 2017-03-17 LAB — MICROALBUMIN / CREATININE URINE RATIO
CREATININE, U: 171.4 mg/dL
MICROALB/CREAT RATIO: 56.4 mg/g — AB (ref 0.0–30.0)
Microalb, Ur: 96.6 mg/dL — ABNORMAL HIGH (ref 0.0–1.9)

## 2017-03-17 LAB — HEMOGLOBIN A1C: Hgb A1c MFr Bld: 6.8 % — ABNORMAL HIGH (ref 4.6–6.5)

## 2017-03-17 LAB — TSH: TSH: 3.54 u[IU]/mL (ref 0.35–4.50)

## 2017-03-17 MED ORDER — ZOLPIDEM TARTRATE 5 MG PO TABS: 5.0000 mg | ORAL_TABLET | Freq: Every evening | ORAL | 1 refills | Status: DC | PRN

## 2017-03-17 MED ORDER — METHYLPREDNISOLONE ACETATE 80 MG/ML IJ SUSP
80.0000 mg | Freq: Once | INTRAMUSCULAR | Status: AC
  Administered 2017-03-17: 80 mg via INTRAMUSCULAR

## 2017-03-17 NOTE — Patient Instructions (Addendum)
Please take all new medication as prescribed - the ambien for sleep  You had the steroid shot today  Please continue all other medications as before, and refills have been done if requested.  Please have the pharmacy call with any other refills you may need.  Please continue your efforts at being more active, low cholesterol diet, and weight control.  You are otherwise up to date with prevention measures today.  Please keep your appointments with your specialists as you may have planned  Please go to the LAB in the Basement (turn left off the elevator) for the tests to be done today  You will be contacted by phone if any changes need to be made immediately.  Otherwise, you will receive a letter about your results with an explanation, but please check with MyChart first.  Please remember to sign up for MyChart if you have not done so, as this will be important to you in the future with finding out test results, communicating by private email, and scheduling acute appointments online when needed.  Please return in 6 months, or sooner if needed

## 2017-03-17 NOTE — Progress Notes (Signed)
Subjective:    Patient ID: Andrea Perry, female    DOB: 1950/08/03, 66 y.o.   MRN: [id number]  HPI  Here for wellness and f/u;  Overall doing ok;  Pt denies Chest pain, worsening SOB, DOE, wheezing, orthopnea, PND, worsening LE edema, palpitations, dizziness or syncope.  Pt denies neurological change such as new headache, facial or extremity weakness.  Pt denies polydipsia, polyuria, or low sugar symptoms. Pt states overall good compliance with treatment and medications, good tolerability, and has been trying to follow appropriate diet.  Pt denies worsening depressive symptoms, suicidal ideation or panic. No fever, night sweats, wt loss, loss of appetite, or other constitutional symptoms.  Pt states good ability with ADL's, has low fall risk, home safety reviewed and adequate, no other significant changes in hearing or vision, and only occasionally active with exercise.  No other complaints or interval hx except: Has carotid artery evaluation soon. Insomnia worse recently without worsening depression or panic. Does have several wks ongoing nasal allergy symptoms with clearish congestion, itch and sneezing, without fever, pain, ST, cough, swelling or wheezing. Albuterol Nebs working well for asthma.   Past Medical History:  Diagnosis Date  . ALLERGIC RHINITIS 08/16/2008  . ANXIETY 08/16/2008  . Arthritis    hands, knees, lower back  . ASTHMA 08/16/2008  . Asthma    BRONCHITIS     DR. Lamonte Sakai    . Bladder leak   . Cancer ([place])    MELANOMA      . Chronic lower back pain    problems with disc L2-5  . COPD 08/16/2008  . DEPRESSION 08/16/2008  . Dysrhythmia    hx AF  . Excessive daytime sleepiness 06/19/2015  . GENITAL HERPES 12/03/2006  . GERD (gastroesophageal reflux disease)   . H/O hiatal hernia   . HEMATOCHEZIA 06/20/2009  . Hemorrhoids   . History of cardioversion 10/30/14  . History of kidney stones   . HYPERLIPIDEMIA 08/16/2008  . HYPERTENSION 12/03/2006  . Hypertension   . Impaired glucose  tolerance 09/21/2010  . Overactive bladder 10/20/2015  . Pneumonia    as a child  . Pulmonary HTN ([place]) 06/19/2015  . Shortness of breath dyspnea   . Sleep apnea    MILD NO CPAP ORDERED  . Snoring 06/19/2015   Past Surgical History:  Procedure Laterality Date  . ABDOMINAL HYSTERECTOMY    . APPENDECTOMY    . CARDIOVERSION N/A 03/20/2014   Procedure: CARDIOVERSION;  Surgeon: Josue Hector, MD;  Location: [facility] ENDOSCOPY;  Service: Cardiovascular;  Laterality: N/A;  . CARDIOVERSION N/A 10/30/2014   Procedure: CARDIOVERSION;  Surgeon: Josue Hector, MD;  Location: [facility] ENDOSCOPY;  Service: Cardiovascular;  Laterality: N/A;  . CARPAL TUNNEL RELEASE     Right + LEFT  . CESAREAN SECTION     x 1   . COLONOSCOPY  2004  . CYST EXCISION     RT ARM   . CYSTOCELE REPAIR N/A 10/25/2012   Procedure: ANTERIOR REPAIR (CYSTOCELE);  Surgeon: Gus Height, MD;  Location: [facility];  Service: Gynecology;  Laterality: N/A;  . HEEL SPUR SURGERY Bilateral   . KNEE SURGERY     Right + LEFT  . LUMBAR LAMINECTOMY/DECOMPRESSION MICRODISCECTOMY N/A 03/19/2016   Procedure: Laminectomy and Foraminotomy - Lumbar three-four - Lumbar four-five;  Surgeon: Eustace Moore, MD;  Location: [place];  Service: Neurosurgery;  Laterality: N/A;  . RADIOLOGY WITH ANESTHESIA Right 11/01/2014   Procedure: MRI RIGHT FOREARM;  Surgeon: Medication Radiologist, MD;  Location: [place];  Service: Radiology;  Laterality: Right;  . RADIOLOGY WITH ANESTHESIA Right 08/29/2015   Procedure: MRI - RIGHT FOREARM;  Surgeon: Medication Radiologist, MD;  Location: [place];  Service: Radiology;  Laterality: Right;  . RADIOLOGY WITH ANESTHESIA N/A 12/05/2015   Procedure: MRI SPINE WITHOUT;  Surgeon: Medication Radiologist, MD;  Location: [place];  Service: Radiology;  Laterality: N/A;  . SKIN CANCER EXCISION     BILAT SHOULDERS  . SPLIT NIGHT STUDY  09/04/2015  . TONSILLECTOMY AND ADENOIDECTOMY      reports that she quit smoking about 13 years ago. Her smoking use  included cigarettes. She has a 45.00 pack-year smoking history. she has never used smokeless tobacco. She reports that she drinks alcohol. She reports that she does not use drugs. family history includes Colon polyps in her mother; Diabetes in her mother; Hyperlipidemia in her mother; Hypertension in her mother; Stroke in her father. Allergies  Allergen Reactions  . Tramadol Hcl Nausea And Vomiting and Other (See Comments)     mouth dryness, headache  . Codeine Nausea And Vomiting  . Tape Rash    Plastic tape, bandaids and ekg leads, causes redness and rash  . Vicodin [Hydrocodone-Acetaminophen] Nausea And Vomiting   Current Outpatient Medications on File Prior to Visit  Medication Sig Dispense Refill  . albuterol (VENTOLIN HFA) 108 (90 Base) MCG/ACT inhaler Inhale 2 puffs into the lungs every 6 (six) hours as needed for wheezing or shortness of breath. Yearly physical due in June must see MD for refills 54 each 0  . amLODipine (NORVASC) 10 MG tablet Take 1 tablet (10 mg total) by mouth daily. 90 tablet 3  . apixaban (ELIQUIS) 5 MG TABS tablet Take 1 tablet (5 mg total) by mouth 2 (two) times daily. 180 tablet 3  . Black Cohosh 20 MG TABS Take 40 mg by mouth daily.    . cetirizine (ZYRTEC) 10 MG tablet Take 10 mg by mouth daily.     . Cholecalciferol (VITAMIN D) 2000 units tablet Take 2,000 Units by mouth daily.    . Cyanocobalamin (VITAMELTS ENERGY VITAMIN B-12) 1500 MCG TBDP Take 1,500 mcg by mouth daily.    Marland Kitchen dimenhyDRINATE (DRAMAMINE) 50 MG tablet Take 50 mg by mouth daily as needed for nausea.    Marland Kitchen escitalopram (LEXAPRO) 20 MG tablet Take 1 tablet (20 mg total) by mouth daily. 9000 tablet 3  . estradiol (ESTRACE) 2 MG tablet Take 1 tablet (2 mg total) by mouth daily. 90 tablet 0  . flecainide (TAMBOCOR) 50 MG tablet Take 1 tablet (50 mg total) by mouth 2 (two) times daily. 180 tablet 3  . furosemide (LASIX) 40 MG tablet Take 1 tablet (40 mg total) by mouth daily. 90 tablet 0  . Garlic  1478 MG CAPS Take 1,000 mg by mouth at bedtime.     Marland Kitchen guaiFENesin (MUCINEX) 600 MG 12 hr tablet Take 1 tablet (600 mg total) by mouth 2 (two) times daily as needed for cough or to loosen phlegm. 60 tablet 2  . ipratropium-albuterol (DUONEB) 0.5-2.5 (3) MG/3ML SOLN Take 3 mLs by nebulization 4 (four) times daily. 120 mL 5  . irbesartan (AVAPRO) 150 MG tablet Take 1 tablet (150 mg total) by mouth daily. 90 tablet 0  . ketoconazole (NIZORAL) 2 % cream Apply 1 application topically daily. 15 g 0  . LORazepam (ATIVAN) 1 MG tablet TAKE ONE TABLET BY MOUTH AT BEDTIME AS NEEDED FOR ANXIETY 90 tablet 1  . metoprolol  tartrate (LOPRESSOR) 50 MG tablet Take 1.5 tablets (75 mg total) by mouth 2 (two) times daily. 270 tablet 3  . nystatin (MYCOSTATIN/NYSTOP) powder Use as directed twice daily as needed 30 g 1  . Omega-3 Fatty Acids (FISH OIL) 1200 MG CAPS Take 1,200 mg by mouth daily.     . pantoprazole (PROTONIX) 40 MG tablet Take 1 tablet (40 mg total) by mouth daily. 90 tablet 0  . potassium chloride SA (K-DUR,KLOR-CON) 20 MEQ tablet Take 1 tablet (20 mEq total) by mouth daily. 90 tablet 0  . pravastatin (PRAVACHOL) 40 MG tablet Take 1 tablet (40 mg total) by mouth daily. 90 tablet 0  . Spacer/Aero-Holding Chambers (AEROCHAMBER MV) inhaler Use as instructed 1 each 0  . Tetrahydrozoline HCl (VISINE OP) Place 1 drop into both eyes daily as needed (dry eyes).    . vitamin E 400 UNIT capsule Take 400 Units by mouth daily.     No current facility-administered medications on file prior to visit.    Review of Systems Constitutional: Negative for other unusual diaphoresis, sweats, appetite or weight changes HENT: Negative for other worsening hearing loss, ear pain, facial swelling, mouth sores or neck stiffness.   Eyes: Negative for other worsening pain, redness or other visual disturbance.  Respiratory: Negative for other stridor or swelling Cardiovascular: Negative for other palpitations or other chest pain    Gastrointestinal: Negative for worsening diarrhea or loose stools, blood in stool, distention or other pain Genitourinary: Negative for hematuria, flank pain or other change in urine volume.  Musculoskeletal: Negative for myalgias or other joint swelling.  Skin: Negative for other color change, or other wound or worsening drainage.  Neurological: Negative for other syncope or numbness. Hematological: Negative for other adenopathy or swelling Psychiatric/Behavioral: Negative for hallucinations, other worsening agitation, SI, self-injury, or new decreased concentration\All other system neg per pt    Objective:   Physical Exam BP 132/88   Pulse 82   Temp 98.4 F (36.9 C) (Oral)   Ht 5\' 2"  (1.575 m)   Wt 272 lb (123.4 kg)   SpO2 96%   BMI 49.75 kg/m  VS noted,  Constitutional: Pt is oriented to person, place, and time. Appears well-developed and well-nourished, in no significant distress and comfortable Head: Normocephalic and atraumatic  Eyes: Conjunctivae and EOM are normal. Pupils are equal, round, and reactive to light Right Ear: External ear normal without discharge Left Ear: External ear normal without discharge Bilat tm's with mild erythema.  Max sinus areas non tender.  Pharynx with mild erythema, no exudate Nose: Nose without discharge or deformity Mouth/Throat: Oropharynx is without other ulcerations and moist  Neck: Normal range of motion. Neck supple. No JVD present. No tracheal deviation present or significant neck LA or mass Cardiovascular: Normal rate, regular rhythm, normal heart sounds and intact distal pulses.   Pulmonary/Chest: WOB normal and breath sounds without rales or wheezing  Abdominal: Soft. Bowel sounds are normal. NT. No HSM  Musculoskeletal: Normal range of motion. Exhibits no edema Lymphadenopathy: Has no other cervical adenopathy.  Neurological: Pt is alert and oriented to person, place, and time. Pt has normal reflexes. No cranial nerve deficit. Motor  grossly intact, Gait intact Skin: Skin is warm and dry. No rash noted or new ulcerations Psychiatric:  Has normal mood and affect. Behavior is normal without agitation No other exam findings    Assessment & Plan:

## 2017-03-18 LAB — HIV ANTIBODY (ROUTINE TESTING W REFLEX): HIV 1&2 Ab, 4th Generation: NONREACTIVE

## 2017-03-19 ENCOUNTER — Ambulatory Visit (HOSPITAL_COMMUNITY)
Admission: RE | Admit: 2017-03-19 | Discharge: 2017-03-19 | Disposition: A | Discharge status: Home / Self Care | Payer: Medicare Other | Source: Ambulatory Visit | Attending: Cardiology | Admitting: Cardiology

## 2017-03-19 DIAGNOSIS — I6523 Occlusion and stenosis of bilateral carotid arteries: Secondary | ICD-10-CM | POA: Diagnosis not present

## 2017-03-19 DIAGNOSIS — I10 Essential (primary) hypertension: Secondary | ICD-10-CM | POA: Insufficient documentation

## 2017-03-19 DIAGNOSIS — Z794 Long term (current) use of insulin: Secondary | ICD-10-CM | POA: Insufficient documentation

## 2017-03-19 DIAGNOSIS — Z823 Family history of stroke: Secondary | ICD-10-CM | POA: Diagnosis not present

## 2017-03-19 DIAGNOSIS — Z87891 Personal history of nicotine dependence: Secondary | ICD-10-CM | POA: Diagnosis not present

## 2017-03-19 DIAGNOSIS — E119 Type 2 diabetes mellitus without complications: Secondary | ICD-10-CM | POA: Diagnosis not present

## 2017-03-20 NOTE — Assessment & Plan Note (Signed)
Lab Results  Component Value Date   HGBA1C 6.8 (H) 03/17/2017  stable overall by history and exam, recent data reviewed with pt, and pt to continue medical treatment as before,  to f/u any worsening symptoms or concerns

## 2017-03-20 NOTE — Assessment & Plan Note (Signed)

## 2017-03-20 NOTE — Assessment & Plan Note (Signed)
BP Readings from Last 3 Encounters:  03/17/17 132/88  02/16/17 118/76  02/16/17 (!) 110/56  stable overall by history and exam, recent data reviewed with pt, and pt to continue medical treatment as before,  to f/u any worsening symptoms or concerns

## 2017-03-20 NOTE — Assessment & Plan Note (Signed)
Mild to mod, for ambien qhs prn,  to f/u any worsening symptoms or concerns  

## 2017-03-20 NOTE — Assessment & Plan Note (Addendum)
Mild to mod, for depomedrol IM 80,  to f/u any worsening symptoms or concerns  In addition to the time spent performing CPE, I spent an additional 15 minutes face to face,in which greater than 50% of this time was spent in counseling and coordination of care for patient's illness as documented, including the differential dx, tx, further evaluation and other management of insomnia, allergy, hyperglycemia and HTN

## 2017-03-22 ENCOUNTER — Telehealth: Payer: Self-pay

## 2017-03-22 DIAGNOSIS — I10 Essential (primary) hypertension: Secondary | ICD-10-CM

## 2017-03-22 NOTE — Telephone Encounter (Signed)
-----   Message from Josue Hector, MD sent at 03/21/2017  8:45 AM EST ----- Plaque no stenosis f/u carotid duplex in 2 years

## 2017-03-22 NOTE — Telephone Encounter (Signed)
Patient aware of test results. Per Dr. Johnsie Cancel, Plaque no stenosis f/u carotid duplex in 2 years. Patient verbalized understanding. Will send copy to patient's PCP.

## 2017-03-25 ENCOUNTER — Other Ambulatory Visit: Payer: Self-pay | Admitting: Internal Medicine

## 2017-03-25 DIAGNOSIS — Z1231 Encounter for screening mammogram for malignant neoplasm of breast: Secondary | ICD-10-CM | POA: Diagnosis not present

## 2017-03-25 DIAGNOSIS — Z01419 Encounter for gynecological examination (general) (routine) without abnormal findings: Secondary | ICD-10-CM | POA: Diagnosis not present

## 2017-04-02 DIAGNOSIS — J449 Chronic obstructive pulmonary disease, unspecified: Secondary | ICD-10-CM | POA: Diagnosis not present

## 2017-04-08 ENCOUNTER — Other Ambulatory Visit: Payer: Self-pay

## 2017-04-08 ENCOUNTER — Telehealth: Payer: Self-pay | Admitting: Internal Medicine

## 2017-04-08 MED ORDER — ALBUTEROL SULFATE HFA 108 (90 BASE) MCG/ACT IN AERS: 2.0000 | INHALATION_SPRAY | Freq: Four times a day (QID) | RESPIRATORY_TRACT | 0 refills | Status: DC | PRN

## 2017-04-08 NOTE — Telephone Encounter (Signed)
Copied from [place] [phone] [phone]. Topic: Quick Communication - Rx Refill/Question >> Apr 08, 2017  8:51 AM Carolyn Stare wrote: Has the patient contacted their pharmacy? yes   albuterol (VENTOLIN HFA) 108 (90 Base) MCG/ACT inhaler,    irbesartan,   pravastatin  Preferred Pharmacy (with phone number or street name  [facility]   Agent: Please be advised that RX refills may take up to 3 business days. We ask that you follow-up with your pharmacy.

## 2017-05-13 DIAGNOSIS — L304 Erythema intertrigo: Secondary | ICD-10-CM | POA: Diagnosis not present

## 2017-05-13 DIAGNOSIS — I8311 Varicose veins of right lower extremity with inflammation: Secondary | ICD-10-CM | POA: Diagnosis not present

## 2017-05-13 DIAGNOSIS — I872 Venous insufficiency (chronic) (peripheral): Secondary | ICD-10-CM | POA: Diagnosis not present

## 2017-05-13 DIAGNOSIS — I8312 Varicose veins of left lower extremity with inflammation: Secondary | ICD-10-CM | POA: Diagnosis not present

## 2017-05-17 DIAGNOSIS — J449 Chronic obstructive pulmonary disease, unspecified: Secondary | ICD-10-CM | POA: Diagnosis not present

## 2017-06-23 ENCOUNTER — Other Ambulatory Visit: Payer: Self-pay | Admitting: Internal Medicine

## 2017-06-23 NOTE — Telephone Encounter (Signed)
Done erx 

## 2017-06-24 ENCOUNTER — Other Ambulatory Visit: Payer: Self-pay | Admitting: Internal Medicine

## 2017-06-25 DIAGNOSIS — H52223 Regular astigmatism, bilateral: Secondary | ICD-10-CM | POA: Diagnosis not present

## 2017-07-05 DIAGNOSIS — J449 Chronic obstructive pulmonary disease, unspecified: Secondary | ICD-10-CM | POA: Diagnosis not present

## 2017-07-23 ENCOUNTER — Encounter: Payer: Self-pay | Admitting: Emergency Medicine

## 2017-07-23 ENCOUNTER — Ambulatory Visit: Payer: Medicare Other | Admitting: Emergency Medicine

## 2017-07-23 DIAGNOSIS — J301 Allergic rhinitis due to pollen: Secondary | ICD-10-CM | POA: Diagnosis not present

## 2017-07-23 DIAGNOSIS — J438 Other emphysema: Secondary | ICD-10-CM | POA: Diagnosis not present

## 2017-07-23 NOTE — Assessment & Plan Note (Signed)
Continue your Zyrtec every day.  If you have increasing nasal congestion or allergy symptoms then you might want to try adding fluticasone (Flonase) nasal spray temporarily

## 2017-07-23 NOTE — Progress Notes (Signed)
Subjective:    Patient ID: [name], female    DOB: 09/30/1950, 67 y.o.   MRN: [id number]  HPI 67 yo woman, hx of childhood asthma, hx tobacco (45 pk-yrs), allergies, GERD, carries a dx of COPD and adult asthma made by Dr Jenny Reichmann. She has worked in a [employer] and had to have spirometry as a part of her job. She is referred today for her COPD and also for pre-op eval for L knee surgery w Dr Noemi Chapel. She describes some exertional dyspnea, happens after 50ft. She has some occasional cough, non-productive. She sometimes hears wheeze. She has had episodes of light-headedness when walking an extended distance.   ROV 06/24/16 -- patient has a history of COPD with positive bronchodilator response, cotton dust exposure. Also with mild obstructive sleep apnea, not on CPAP. She reports that she has been having exertional dyspnea. She switched back to Advair temporarily due to cost. She is having some wheeze. Occasional dry cough. She is on zyrtec. Uses albuterol several times a week.   ROV 12/12/16 -- This is a follow-up visit for patient with a history of COPD and long-standing asthma with associated chronic obstruction. Also with obstructive sleep apnea not currently on CPAP. Last time we started Anoro, she took it for a couple months and did benefit, but it is too expensive.  She was also unable to afford Advair. She is taking zyrtec. She has albuterol, uses about once a day. She has daily cough, usually in the am. Occasional wheezing.   ROV 07/23/17 --Mrs. Rocchi has a history of childhood asthma and tobacco use with COPD.  She also has obstructive sleep apnea and allergic rhinitis.  I last saw her 6 months ago.  She returns today reporting that she has occasional days w dyspnea on exertion, household chores. She is on DuoNeb qid on a schedule. Her activity level is a bit better. She has minimal cough, wheeze. She is on zyrtec. No flares since last time.   Review of Systems  Constitutional: Negative for fever  and unexpected weight change.  HENT: Negative for congestion, dental problem, ear pain, nosebleeds, postnasal drip, rhinorrhea, sinus pressure, sneezing, sore throat and trouble swallowing.   Eyes: Negative for redness and itching.  Respiratory: Positive for cough and shortness of breath. Negative for chest tightness and wheezing.   Cardiovascular: Negative for palpitations and leg swelling.  Gastrointestinal: Negative for nausea and vomiting.  Genitourinary: Negative for dysuria.  Musculoskeletal: Negative for joint swelling.  Skin: Negative for rash.  Neurological: Negative for headaches.  Hematological: Does not bruise/bleed easily.  Psychiatric/Behavioral: Negative for dysphoric mood. The patient is not nervous/anxious.        Objective:   Physical Exam Vitals:   07/23/17 1000  BP: 122/68  Pulse: 64  SpO2: 95%  Weight: 255 lb 3.2 oz (115.8 kg)  Height: 5\' 2"  (1.575 m)   Gen: Pleasant, overwt,  in no distress,  normal affect  ENT: No lesions,  mouth clear,  oropharynx clear, no postnasal drip  Neck: No JVD, no stridor  Lungs: No use of accessory muscles, clear, distant at both bases  Cardiovascular: RRR, heart sounds normal, no murmur or gallops, no peripheral edema  Musculoskeletal: No deformities, no cyanosis or clubbing  Neuro: alert, non focal  Skin: Warm, no lesions or rashes      Assessment & Plan:  COPD (chronic obstructive pulmonary disease) No flares, stable on this regimen. More cost effective than long-acting regimen.   Please continue  your DuoNeb 4 times a day on a schedule. Use albuterol either inhaler or nebulizer if needed for shortness of breath, chest tightness, wheezing. Your pneumonia shots are up-to-date.  You would probably need to get the Pneumovax again at some point after age 10. Follow with Dr Lamonte Sakai in 6 months or sooner if you have any problems  Allergic rhinitis Continue your Zyrtec every day.  If you have increasing nasal congestion  or allergy symptoms then you might want to try adding fluticasone (Flonase) nasal spray temporarily  Baltazar Apo, MD, PhD 07/23/2017, 10:24 AM  Pulmonary and Critical Care [phone] or if no answer [phone]

## 2017-07-23 NOTE — Patient Instructions (Signed)
Please continue your DuoNeb 4 times a day on a schedule. Use albuterol either inhaler or nebulizer if needed for shortness of breath, chest tightness, wheezing. Your pneumonia shots are up-to-date.  You would probably need to get the Pneumovax again at some point after age 65. Continue your Zyrtec every day.  If you have increasing nasal congestion or allergy symptoms then you might want to try adding fluticasone (Flonase) nasal spray temporarily Follow with Dr Lamonte Sakai in 6 months or sooner if you have any problems

## 2017-07-23 NOTE — Assessment & Plan Note (Signed)
No flares, stable on this regimen. More cost effective than long-acting regimen.   Please continue your DuoNeb 4 times a day on a schedule. Use albuterol either inhaler or nebulizer if needed for shortness of breath, chest tightness, wheezing. Your pneumonia shots are up-to-date.  You would probably need to get the Pneumovax again at some point after age 65. Follow with Dr Lamonte Sakai in 6 months or sooner if you have any problems

## 2017-07-27 ENCOUNTER — Encounter: Payer: Self-pay | Admitting: Cardiovascular Disease

## 2017-08-11 ENCOUNTER — Encounter: Payer: Self-pay | Admitting: Cardiovascular Disease

## 2017-08-11 ENCOUNTER — Telehealth: Payer: Self-pay | Admitting: Emergency Medicine

## 2017-08-11 MED ORDER — IPRATROPIUM-ALBUTEROL 0.5-2.5 (3) MG/3ML IN SOLN: 3.0000 mL | Freq: Four times a day (QID) | RESPIRATORY_TRACT | 5 refills | Status: DC

## 2017-08-11 NOTE — Telephone Encounter (Signed)
Spoke with patient. She was requesting a refill on her DuoNebs to be sent to [facility] on [address]. Advised patient that I would send in RX today. She verbalized understanding.   Nothing else needed at time of call.

## 2017-08-11 NOTE — Telephone Encounter (Signed)
ATC patient, VM was full. Will attempt to call back later.

## 2017-08-12 ENCOUNTER — Telehealth: Payer: Self-pay | Admitting: Cardiovascular Disease

## 2017-08-12 ENCOUNTER — Encounter: Payer: Self-pay | Admitting: Cardiovascular Disease

## 2017-08-12 MED ORDER — APIXABAN 5 MG PO TABS: 5.0000 mg | ORAL_TABLET | Freq: Two times a day (BID) | ORAL | 1 refills | Status: DC

## 2017-08-12 NOTE — Telephone Encounter (Signed)
rx sent as requested.

## 2017-08-12 NOTE — Telephone Encounter (Signed)
New Message:       *STAT* If patient is at the pharmacy, call can be transferred to refill team.   1. Which medications need to be refilled? (please list name of each medication and dose if known) apixaban (ELIQUIS) 5 MG TABS tablet  2. Which pharmacy/location (including street and city if local pharmacy) is medication to be sent to?[place], [place] - [address]  3. Do they need a 30 day or 90 day supply? [place]

## 2017-08-13 DIAGNOSIS — J449 Chronic obstructive pulmonary disease, unspecified: Secondary | ICD-10-CM | POA: Diagnosis not present

## 2017-08-18 ENCOUNTER — Ambulatory Visit: Payer: Medicare Other | Admitting: Cardiovascular Disease

## 2017-08-24 ENCOUNTER — Encounter: Payer: Self-pay | Admitting: Sports Medicine

## 2017-08-24 ENCOUNTER — Ambulatory Visit (INDEPENDENT_AMBULATORY_CARE_PROVIDER_SITE_OTHER): Payer: Medicare Other | Admitting: Sports Medicine

## 2017-08-24 VITALS — BP 115/79 | Ht 62.0 in | Wt 250.0 lb

## 2017-08-24 DIAGNOSIS — M25511 Pain in right shoulder: Secondary | ICD-10-CM | POA: Diagnosis not present

## 2017-08-24 DIAGNOSIS — M25512 Pain in left shoulder: Secondary | ICD-10-CM | POA: Diagnosis not present

## 2017-08-24 MED ORDER — METHYLPREDNISOLONE ACETATE 40 MG/ML IJ SUSP
40.0000 mg | Freq: Once | INTRAMUSCULAR | Status: AC
  Administered 2017-08-24: 40 mg via INTRA_ARTICULAR

## 2017-08-24 NOTE — Progress Notes (Signed)
     Date of Visit: 08/24/2017   HPI:  Bilateral Shoulder Pain: - reports of 2 week history of significant shoulder pain with left greater than right - no injury that she can recall - no radiation of pain - worse with overhead activity  - no pain with sleeping. She sleeps on her back  - uses Tylenol PRN which helps some - no paresthesias  - at the last visit she was contemplating on getting surgery but has decided against it for the time being  ROS: See HPI.  [place]:  PMH:  HTN Atrial Fibrillation  COPD Allergic Rhinitis DM2 Depression/Anxiety  Hypothyroidism  PHYSICAL EXAM: BP 115/79   Ht 5\' 2"  (1.575 m)   Wt 250 lb (113.4 kg)   BMI 45.73 kg/m  Gen:NAD, pleasant  Left Shoulder:  Inspection reveals no abnormalities, atrophy or asymmetry. Palpation is normal with no tenderness over AC joint or bicipital groove. Passive range of motion is limited with flexion (about 130 degrees) abduction (100 degrees), internal rotation (at the level of the sacrum). Active external rotation range of motion is normal.  Rotator cuff strength normal throughout Signs of impingement with positive Hawkin's tests and empty can. No drop arm sign. Neurovascularly intact  Right Shoulder:  Inspection reveals no abnormalities, atrophy or asymmetry. Palpation is normal with no tenderness over AC joint or bicipital groove. Passive range of motion is limited with flexion (about 130 degrees) abduction (100 degrees), internal rotation (at the level of the sacrum). Active external rotation range of motion is normal.  Rotator cuff strength normal except weakness with external rotation  Signs of impingement with Hawkin's tests and empty can. No drop arm sign. Neurovascularly intact   Consent obtained and verified: Left Subacromial Injection  Time-out conducted. Noted no overlying erythema, induration, or other signs of local infection. Skin prepped in a sterile fashion. Topical analgesic spray:  Ethyl chloride. Joint: Left shoulder Needle: 25g 1.5 inch Completed without difficulty. Meds: 3cc 1% xylocaine, 1cc (40mg ) depomedrol  Consent obtained and verified: Right Subacromial Injection Time-out conducted. Noted no overlying erythema, induration, or other signs of local infection. Skin prepped in a sterile fashion. Topical analgesic spray: Ethyl chloride. Joint: right shoulder Needle: 25g 1.5 inch Completed without difficulty. Meds: 3cc 1% xylocaine, 1cc (40mg ) depomedrol   ASSESSMENT/PLAN:  Bilateral Shoulder Pain:  Likely secondary to rotator cuff tendinopathy. She may have a partial rotator cuff tear on the right as she does have weakness with external rotation against resistance. Subacromial shoulder injections done bilaterally as noted above. Advised to call if fevers/chills, erythema, induration, drainage, or persistent bleeding.  Smiley Houseman, MD PGY [place]  Patient seen and evaluated with the resident. I agree with the above plan of care. Patient has a known history of right shoulder rotator cuff tendinopathy. She may now have a tear through the infraspinatus but she is not interested in surgery currently. Bilateral subacromial cortisone injections were performed as above. Patient tolerated them without difficulty. If symptoms persist then we will need to consider updated imaging. Follow-up for ongoing or recalcitrant issues.

## 2017-08-25 ENCOUNTER — Encounter: Payer: Self-pay | Admitting: Sports Medicine

## 2017-08-26 ENCOUNTER — Ambulatory Visit: Payer: Medicare Other | Admitting: Cardiovascular Disease

## 2017-09-01 DIAGNOSIS — K136 Irritative hyperplasia of oral mucosa: Secondary | ICD-10-CM | POA: Diagnosis not present

## 2017-09-02 ENCOUNTER — Telehealth: Payer: Self-pay | Admitting: Cardiovascular Disease

## 2017-09-02 NOTE — Telephone Encounter (Signed)
Patient with diagnosis of atrial fibrillation on Eliquis for anticoagulation.    Procedure: biopsy on tongue Date of procedure: TBD  CHADS2-VASc score of  3 (, HTN, AGE, , female)  CrCl 154 Platelet count 283  Per office protocol, patient can hold Eliquis for 1 days prior to procedure.    Patient should restart Eliquis on the evening of procedure or day after, at discretion of procedure MD

## 2017-09-02 NOTE — Telephone Encounter (Signed)
° °  [facility] Pre-operative Risk Assessment    Request for surgical clearance:  1. What type of surgery is being performed? Biopsy on tongue   2. When is this surgery scheduled? TBD  3. What type of clearance is required (medical clearance vs. Pharmacy clearance to hold med vs. Both)? Pharmacy  4. Are there any medications that need to be held prior to surgery and how long?Elquis  5. Practice name and name of physician performing surgery? Dr Romie Minus DDS  6. What is your office phone number [phone]   7.   What is your office fax number [phone]  8.   Anesthesia type (None, local, MAC, general) ? local   Pauline Good 09/02/2017, 1:12 PM  _________________________________________________________________   (provider comments below)

## 2017-09-06 NOTE — Telephone Encounter (Signed)
Follow up     Andrea Perry with Dr. Lamont Dowdy office is follow up in reference to Cardiac clearance     Request for surgical clearance:  1. What type of surgery is being performed? Biopsy on tongue   2. When is this surgery scheduled? TBD  3. What type of clearance is required (medical clearance vs. Pharmacy clearance to hold med vs. Both)? Pharmacy  4. Are there any medications that need to be held prior to surgery and how long?Elquis  5. Practice name and name of physician performing surgery? Dr Romie Minus DDS  6. What is your office phone number [phone]   7.   What is your office fax number [phone]  8.   Anesthesia type (None, local, MAC, general) ? local

## 2017-09-08 ENCOUNTER — Telehealth: Payer: Self-pay | Admitting: Internal Medicine

## 2017-09-08 MED ORDER — PANTOPRAZOLE SODIUM 40 MG PO TBEC: 40.0000 mg | DELAYED_RELEASE_TABLET | Freq: Every day | ORAL | 1 refills | Status: DC

## 2017-09-08 NOTE — Telephone Encounter (Signed)
Copied from [place] [id number]. Topic: Quick Communication - Rx Refill/Question >> Sep 08, 2017  2:54 PM Neva Seat wrote: pantoprazole (PROTONIX) 40 MG tablet - pt is needing the refills on this  [place], [place] - [address] [place] [address] Phone: [phone] Fax: [phone]

## 2017-09-08 NOTE — Telephone Encounter (Signed)
Medication sent to requested pharmacy.

## 2017-09-15 ENCOUNTER — Other Ambulatory Visit (INDEPENDENT_AMBULATORY_CARE_PROVIDER_SITE_OTHER): Payer: Medicare Other

## 2017-09-15 ENCOUNTER — Encounter: Payer: Self-pay | Admitting: Internal Medicine

## 2017-09-15 ENCOUNTER — Ambulatory Visit: Payer: Medicare Other | Admitting: Internal Medicine

## 2017-09-15 VITALS — BP 130/78 | HR 79 | Temp 98.0°F | Ht 62.0 in | Wt 270.0 lb

## 2017-09-15 DIAGNOSIS — E119 Type 2 diabetes mellitus without complications: Secondary | ICD-10-CM

## 2017-09-15 DIAGNOSIS — E785 Hyperlipidemia, unspecified: Secondary | ICD-10-CM | POA: Diagnosis not present

## 2017-09-15 DIAGNOSIS — J438 Other emphysema: Secondary | ICD-10-CM | POA: Diagnosis not present

## 2017-09-15 DIAGNOSIS — R809 Proteinuria, unspecified: Secondary | ICD-10-CM | POA: Diagnosis not present

## 2017-09-15 DIAGNOSIS — I10 Essential (primary) hypertension: Secondary | ICD-10-CM | POA: Diagnosis not present

## 2017-09-15 LAB — HEPATIC FUNCTION PANEL
ALK PHOS: 61 U/L (ref 39–117)
ALT: 12 U/L (ref 0–35)
AST: 9 U/L (ref 0–37)
Albumin: 3.7 g/dL (ref 3.5–5.2)
Bilirubin, Direct: 0.1 mg/dL (ref 0.0–0.3)
Total Bilirubin: 0.7 mg/dL (ref 0.2–1.2)
Total Protein: 6.5 g/dL (ref 6.0–8.3)

## 2017-09-15 LAB — LIPID PANEL
Cholesterol: 151 mg/dL (ref 0–200)
HDL: 48.6 mg/dL (ref >39.00)
LDL Cholesterol: 69 mg/dL (ref 0–99)
NONHDL: 102
Total CHOL/HDL Ratio: 3
Triglycerides: 164 mg/dL — ABNORMAL HIGH (ref 0.0–149.0)
VLDL: 32.8 mg/dL (ref 0.0–40.0)

## 2017-09-15 LAB — HEMOGLOBIN A1C: HEMOGLOBIN A1C: 6.3 % (ref 4.6–6.5)

## 2017-09-15 LAB — BASIC METABOLIC PANEL
BUN: 13 mg/dL (ref 6–23)
CALCIUM: 8.8 mg/dL (ref 8.4–10.5)
CO2: 32 meq/L (ref 19–32)
CREATININE: 0.66 mg/dL (ref 0.40–1.20)
Chloride: 104 mEq/L (ref 96–112)
GFR: 95.62 mL/min (ref >60.00)
GLUCOSE: 126 mg/dL — AB (ref 70–99)
Potassium: 4.6 mEq/L (ref 3.5–5.1)
SODIUM: 143 meq/L (ref 135–145)

## 2017-09-15 NOTE — Assessment & Plan Note (Signed)
stable overall by history and exam, recent data reviewed with pt, and pt to continue medical treatment as before,  to f/u any worsening symptoms or concern Lab Results  Component Value Date   HGBA1C 6.3 09/15/2017

## 2017-09-15 NOTE — Assessment & Plan Note (Signed)
stable overall by history and exam, recent data reviewed with pt, and pt to continue medical treatment as before,  to f/u any worsening symptoms or concerns BP Readings from Last 3 Encounters:  09/15/17 130/78  08/24/17 115/79  07/23/17 122/68

## 2017-09-15 NOTE — Assessment & Plan Note (Signed)
stable overall by history and exam, recent data reviewed with pt, and pt to continue medical treatment as before,  to f/u any worsening symptoms or concerns Lab Results  Component Value Date   LDLCALC 69 09/15/2017

## 2017-09-15 NOTE — Progress Notes (Signed)
Subjective:    Patient ID: Andrea Perry, female    DOB: [birth year], 67 y.o.   MRN: [id number]  HPI  Here to f/u; overall doing ok,  Pt denies chest pain, increasing sob or doe, wheezing, orthopnea, PND, increased LE swelling, palpitations, dizziness or syncope.  Pt denies new neurological symptoms such as new headache, or facial or extremity weakness or numbness.  Pt denies polydipsia, polyuria, or low sugar episode.  Pt states overall good compliance with meds, mostly trying to follow appropriate diet, with wt overall stable,  but little exercise however. Pt is for minor tongue surgury in am for benign lesion Wt Readings from Last 3 Encounters:  09/15/17 270 lb (122.5 kg)  08/24/17 250 lb (113.4 kg)  07/23/17 255 lb 3.2 oz (115.8 kg)   Past Medical History:  Diagnosis Date  . ALLERGIC RHINITIS 08/16/2008  . ANXIETY 08/16/2008  . Arthritis    hands, knees, lower back  . ASTHMA 08/16/2008  . Asthma    BRONCHITIS     DR. Lamonte Sakai    . Bladder leak   . Cancer ([place])    MELANOMA      . Chronic lower back pain    problems with disc L2-5  . COPD 08/16/2008  . DEPRESSION 08/16/2008  . Dysrhythmia    hx AF  . Excessive daytime sleepiness 06/19/2015  . GENITAL HERPES 12/03/2006  . GERD (gastroesophageal reflux disease)   . H/O hiatal hernia   . HEMATOCHEZIA 06/20/2009  . Hemorrhoids   . History of cardioversion 10/30/14  . History of kidney stones   . HYPERLIPIDEMIA 08/16/2008  . HYPERTENSION 12/03/2006  . Hypertension   . Impaired glucose tolerance 09/21/2010  . Overactive bladder 10/20/2015  . Pneumonia    as a child  . Pulmonary HTN ([place]) 06/19/2015  . Shortness of breath dyspnea   . Sleep apnea    MILD NO CPAP ORDERED  . Snoring 06/19/2015   Past Surgical History:  Procedure Laterality Date  . ABDOMINAL HYSTERECTOMY    . APPENDECTOMY    . CARDIOVERSION N/A 03/20/2014   Procedure: CARDIOVERSION;  Surgeon: Josue Hector, MD;  Location: [facility] ENDOSCOPY;  Service: Cardiovascular;  Laterality:  N/A;  . CARDIOVERSION N/A 10/30/2014   Procedure: CARDIOVERSION;  Surgeon: Josue Hector, MD;  Location: [facility] ENDOSCOPY;  Service: Cardiovascular;  Laterality: N/A;  . CARPAL TUNNEL RELEASE     Right + LEFT  . CESAREAN SECTION     x 1   . COLONOSCOPY  2004  . CYST EXCISION     RT ARM   . CYSTOCELE REPAIR N/A 10/25/2012   Procedure: ANTERIOR REPAIR (CYSTOCELE);  Surgeon: Gus Height, MD;  Location: [facility];  Service: Gynecology;  Laterality: N/A;  . HEEL SPUR SURGERY Bilateral   . KNEE SURGERY     Right + LEFT  . LUMBAR LAMINECTOMY/DECOMPRESSION MICRODISCECTOMY N/A 03/19/2016   Procedure: Laminectomy and Foraminotomy - Lumbar three-four - Lumbar four-five;  Surgeon: Eustace Moore, MD;  Location: [facility];  Service: Neurosurgery;  Laterality: N/A;  . RADIOLOGY WITH ANESTHESIA Right 11/01/2014   Procedure: MRI RIGHT FOREARM;  Surgeon: Medication Radiologist, MD;  Location: [place];  Service: Radiology;  Laterality: Right;  . RADIOLOGY WITH ANESTHESIA Right 08/29/2015   Procedure: MRI - RIGHT FOREARM;  Surgeon: Medication Radiologist, MD;  Location: [facility];  Service: Radiology;  Laterality: Right;  . RADIOLOGY WITH ANESTHESIA N/A 12/05/2015   Procedure: MRI SPINE WITHOUT;  Surgeon: Medication Radiologist, MD;  Location:  [facility];  Service: Radiology;  Laterality: N/A;  . SKIN CANCER EXCISION     BILAT SHOULDERS  . SPLIT NIGHT STUDY  09/04/2015  . TONSILLECTOMY AND ADENOIDECTOMY      reports that she quit smoking about 14 years ago. Her smoking use included cigarettes. She has a 45.00 pack-year smoking history. She has never used smokeless tobacco. She reports that she drinks alcohol. She reports that she does not use drugs. family history includes Colon polyps in her mother; Diabetes in her mother; Hyperlipidemia in her mother; Hypertension in her mother; Stroke in her father. Allergies  Allergen Reactions  . Tramadol Hcl Nausea And Vomiting and Other (See Comments)     mouth dryness, headache  . Codeine  Nausea And Vomiting  . Tape Rash    Plastic tape, bandaids and ekg leads, causes redness and rash  . Vicodin [Hydrocodone-Acetaminophen] Nausea And Vomiting   Current Outpatient Medications on File Prior to Visit  Medication Sig Dispense Refill  . albuterol (PROVENTIL HFA;VENTOLIN HFA) 108 (90 Base) MCG/ACT inhaler Inhale 2 puffs by mouth every 6 hours as needed for wheezing or shortness of breath. **Yearly physical due in June** 1 Inhaler 11  . amLODipine (NORVASC) 10 MG tablet Take 1 tablet (10 mg total) by mouth daily. 90 tablet 3  . apixaban (ELIQUIS) 5 MG TABS tablet Take 1 tablet (5 mg total) by mouth 2 (two) times daily. 180 tablet 1  . cetirizine (ZYRTEC) 10 MG tablet Take 10 mg by mouth daily.     . Cholecalciferol (VITAMIN D) 2000 units tablet Take 2,000 Units by mouth daily.    . Cyanocobalamin (VITAMELTS ENERGY VITAMIN B-12) 1500 MCG TBDP Take 1,500 mcg by mouth daily.    Marland Kitchen dimenhyDRINATE (DRAMAMINE) 50 MG tablet Take 50 mg by mouth daily as needed for nausea.    Marland Kitchen escitalopram (LEXAPRO) 20 MG tablet Take 1 tablet (20 mg total) by mouth daily. 9000 tablet 3  . estradiol (ESTRACE) 2 MG tablet Take 1 tablet (2 mg total) by mouth daily. 90 tablet 3  . flecainide (TAMBOCOR) 50 MG tablet Take 1 tablet (50 mg total) by mouth 2 (two) times daily. 180 tablet 3  . furosemide (LASIX) 40 MG tablet Take 1 tablet (40 mg total) by mouth daily. 90 tablet 3  . Garlic 8341 MG CAPS Take 1,000 mg by mouth at bedtime.     Marland Kitchen guaiFENesin (MUCINEX) 600 MG 12 hr tablet Take 1 tablet (600 mg total) by mouth 2 (two) times daily as needed for cough or to loosen phlegm. 60 tablet 2  . ipratropium-albuterol (DUONEB) 0.5-2.5 (3) MG/3ML SOLN Take 3 mLs by nebulization 4 (four) times daily. 120 mL 5  . irbesartan (AVAPRO) 150 MG tablet Take 1 tablet (150 mg total) by mouth daily. 90 tablet 0  . LORazepam (ATIVAN) 1 MG tablet Take 1 tablet by mouth at bedtime as needed for anxiety. 90 tablet 1  . metoprolol  tartrate (LOPRESSOR) 50 MG tablet Take 1.5 tablets (75 mg total) by mouth 2 (two) times daily. 270 tablet 3  . nystatin (MYCOSTATIN/NYSTOP) powder Use as directed twice daily as needed 30 g 1  . Omega-3 Fatty Acids (FISH OIL) 1200 MG CAPS Take 1,200 mg by mouth daily.     . pantoprazole (PROTONIX) 40 MG tablet Take 1 tablet (40 mg total) by mouth daily. 90 tablet 1  . potassium chloride SA (K-DUR,KLOR-CON) 20 MEQ tablet Take 1 tablet (20 mEq total) by mouth daily. 90 tablet 3  .  pravastatin (PRAVACHOL) 40 MG tablet Take 1 tablet (40 mg total) by mouth daily. 90 tablet 0  . Tetrahydrozoline HCl (VISINE OP) Place 1 drop into both eyes daily as needed (dry eyes).    . vitamin E 400 UNIT capsule Take 400 Units by mouth daily.    Marland Kitchen zolpidem (AMBIEN) 5 MG tablet Take 1 tablet (5 mg total) by mouth at bedtime as needed for sleep. 90 tablet 1   No current facility-administered medications on file prior to visit.    Review of Systems  Constitutional: Negative for other unusual diaphoresis or sweats HENT: Negative for ear discharge or swelling Eyes: Negative for other worsening visual disturbances Respiratory: Negative for stridor or other swelling  Gastrointestinal: Negative for worsening distension or other blood Genitourinary: Negative for retention or other urinary change Musculoskeletal: Negative for other MSK pain or swelling Skin: Negative for color change or other new lesions Neurological: Negative for worsening tremors and other numbness  Psychiatric/Behavioral: Negative for worsening agitation or other fatigue All other system neg per pt    Objective:   Physical Exam BP 130/78   Pulse 79   Temp 98 F (36.7 C) (Oral)   Ht 5\' 2"  (1.575 m)   Wt 270 lb (122.5 kg)   SpO2 95%   BMI 49.38 kg/m  VS noted,  Constitutional: Pt appears in NAD HENT: Head: NCAT.  Right Ear: External ear normal.  Left Ear: External ear normal.  Eyes: . Pupils are equal, round, and reactive to light.  Conjunctivae and EOM are normal Nose: without d/c or deformity Neck: Neck supple. Gross normal ROM Cardiovascular: Normal rate and regular rhythm.   Pulmonary/Chest: Effort normal and breath sounds without rales or wheezing.  Abd:  Soft, NT, ND, + BS, no organomegaly Neurological: Pt is alert. At baseline orientation, motor grossly intact Skin: Skin is warm. No rashes, other new lesions, no LE edema Psychiatric: Pt behavior is normal without agitation  No other exam findings    Assessment & Plan:

## 2017-09-15 NOTE — Assessment & Plan Note (Signed)
stable overall by history and exam, and pt to continue medical treatment as before,  to f/u any worsening symptoms or concerns 

## 2017-09-15 NOTE — Patient Instructions (Signed)

## 2017-09-15 NOTE — Assessment & Plan Note (Signed)
Noted on last UA, for f/u UA today, consider 24 hr urine

## 2017-09-16 DIAGNOSIS — K136 Irritative hyperplasia of oral mucosa: Secondary | ICD-10-CM | POA: Diagnosis not present

## 2017-09-27 ENCOUNTER — Encounter: Payer: Self-pay | Admitting: Cardiology

## 2017-09-27 ENCOUNTER — Encounter (INDEPENDENT_AMBULATORY_CARE_PROVIDER_SITE_OTHER): Payer: Self-pay

## 2017-09-27 ENCOUNTER — Ambulatory Visit (INDEPENDENT_AMBULATORY_CARE_PROVIDER_SITE_OTHER): Payer: Medicare Other | Admitting: Cardiology

## 2017-09-27 VITALS — BP 130/74 | HR 86 | Ht 62.0 in | Wt 272.0 lb

## 2017-09-27 DIAGNOSIS — R0602 Shortness of breath: Secondary | ICD-10-CM | POA: Diagnosis not present

## 2017-09-27 DIAGNOSIS — I4819 Other persistent atrial fibrillation: Secondary | ICD-10-CM

## 2017-09-27 DIAGNOSIS — I481 Persistent atrial fibrillation: Secondary | ICD-10-CM

## 2017-09-27 DIAGNOSIS — Z7901 Long term (current) use of anticoagulants: Secondary | ICD-10-CM

## 2017-09-27 DIAGNOSIS — R609 Edema, unspecified: Secondary | ICD-10-CM | POA: Diagnosis not present

## 2017-09-27 DIAGNOSIS — J449 Chronic obstructive pulmonary disease, unspecified: Secondary | ICD-10-CM

## 2017-09-27 NOTE — Patient Instructions (Addendum)
Medication Instructions:   FOR THREE DAYS ONLY TAKE LASIX 80 MG THEN RESUME BACK TO LASIX 40 MG ONCE A  DAY   If you need a refill on your cardiac medications before your next appointment, please call your pharmacy.  Labwork: NONE ORDERED  TODAY     Testing/Procedures:  Your physician has requested that you have an echocardiogram. Echocardiography is a painless test that uses sound waves to create images of your heart. It provides your doctor with information about the size and shape of your heart and how well your heart's chambers and valves are working. This procedure takes approximately one hour. There are no restrictions for this procedure.     Follow-Up: IN  2 WEEKS  WITH INGOLD PER INGOLD  TO DISCUSS  CARDIOVERSION   Any Other Special Instructions Will Be Listed Below (If Applicable).

## 2017-09-27 NOTE — Progress Notes (Signed)
Cardiology Office Note   Date:  09/27/2017   ID:  Andrea Perry, DOB 14-Jul-1950, MRN [id number]  PCP:  Biagio Borg, MD  Cardiologist:  Dr. Johnsie Cancel     Chief Complaint  Patient presents with  . Atrial Fibrillation      History of Present Illness: Andrea Perry is a 67 y.o. female who presents for PAF and HTN.    PAF on Eliquis, pulmonary HTN, HTN, morbid obesity COPD and HLD   Last seen 09/2015      Failed DCCV on 03/20/14 started on flecainide. She was unable to do ETT due to arthritis. She did do a Lexiscan1/2016 which showed no ischemia or infarction. EF was 40% but follow up MUGA test showed normal EF of 52%. She continues on Flecainide 50mg  BID. She underwent successful DCCV on 10/30/14 and done well since that time.  Neg sleep study in 2016.  One hospitalization for chest pain in 2017 neg for MI.    Seen by PA in May and was in afib. Rates ok and needed shoulder surgery with Dr Micheline Chapman at [facility] So no attempts made at [facility]:  Also seeing [facility] for back wound from lumbar decompression for spinal Stenosis 02/2016  On disability for this  She is still putting off her shoulder surgery No plans currently. She had not been taking eliquis could not afford --Worried about her carotids father had stroke  last carotid dopplers 02/2017 with plaque and no stenosis, needs duplex in 2 years.   Today she is back on eliquis for 2 months, she did miss one day for tongue surgery.  That was 10 days ago.  No chest pain but her wt is up 20 lbs.  With increased wt her SOB is worse.  She also has significant lower ext edema.  It does improve when she sleeps but returns quickly. She eats out some.  She does not add salt.  We discussed diets- prefer plant based,  Count calories 03-1299 per day.  She continues on the flecainide.   She is now also on nebulizers.  These are helping her.  No bleeding.      Past Medical History:  Diagnosis Date  . ALLERGIC RHINITIS 08/16/2008  . ANXIETY 08/16/2008  .  Arthritis    hands, knees, lower back  . ASTHMA 08/16/2008  . Asthma    BRONCHITIS     DR. Lamonte Sakai    . Bladder leak   . Cancer ([place])    MELANOMA      . Chronic lower back pain    problems with disc L2-5  . COPD 08/16/2008  . DEPRESSION 08/16/2008  . Dysrhythmia    hx AF  . Excessive daytime sleepiness 06/19/2015  . GENITAL HERPES 12/03/2006  . GERD (gastroesophageal reflux disease)   . H/O hiatal hernia   . HEMATOCHEZIA 06/20/2009  . Hemorrhoids   . History of cardioversion 10/30/14  . History of kidney stones   . HYPERLIPIDEMIA 08/16/2008  . HYPERTENSION 12/03/2006  . Hypertension   . Impaired glucose tolerance 09/21/2010  . Overactive bladder 10/20/2015  . Pneumonia    as a child  . Pulmonary HTN ([place]) 06/19/2015  . Shortness of breath dyspnea   . Sleep apnea    MILD NO CPAP ORDERED  . Snoring 06/19/2015    Past Surgical History:  Procedure Laterality Date  . ABDOMINAL HYSTERECTOMY    . APPENDECTOMY    . CARDIOVERSION N/A 03/20/2014   Procedure: CARDIOVERSION;  Surgeon: Collier Salina  Tommy Rainwater, MD;  Location: [facility] ENDOSCOPY;  Service: Cardiovascular;  Laterality: N/A;  . CARDIOVERSION N/A 10/30/2014   Procedure: CARDIOVERSION;  Surgeon: Josue Hector, MD;  Location: [facility] ENDOSCOPY;  Service: Cardiovascular;  Laterality: N/A;  . CARPAL TUNNEL RELEASE     Right + LEFT  . CESAREAN SECTION     x 1   . COLONOSCOPY  2004  . CYST EXCISION     RT ARM   . CYSTOCELE REPAIR N/A 10/25/2012   Procedure: ANTERIOR REPAIR (CYSTOCELE);  Surgeon: Gus Height, MD;  Location: [facility];  Service: Gynecology;  Laterality: N/A;  . HEEL SPUR SURGERY Bilateral   . KNEE SURGERY     Right + LEFT  . LUMBAR LAMINECTOMY/DECOMPRESSION MICRODISCECTOMY N/A 03/19/2016   Procedure: Laminectomy and Foraminotomy - Lumbar three-four - Lumbar four-five;  Surgeon: Eustace Moore, MD;  Location: [facility];  Service: Neurosurgery;  Laterality: N/A;  . RADIOLOGY WITH ANESTHESIA Right 11/01/2014   Procedure: MRI RIGHT FOREARM;  Surgeon:  Medication Radiologist, MD;  Location: [facility];  Service: Radiology;  Laterality: Right;  . RADIOLOGY WITH ANESTHESIA Right 08/29/2015   Procedure: MRI - RIGHT FOREARM;  Surgeon: Medication Radiologist, MD;  Location: [facility];  Service: Radiology;  Laterality: Right;  . RADIOLOGY WITH ANESTHESIA N/A 12/05/2015   Procedure: MRI SPINE WITHOUT;  Surgeon: Medication Radiologist, MD;  Location: [facility];  Service: Radiology;  Laterality: N/A;  . SKIN CANCER EXCISION     BILAT SHOULDERS  . SPLIT NIGHT STUDY  09/04/2015  . TONSILLECTOMY AND ADENOIDECTOMY       Current Outpatient Medications  Medication Sig Dispense Refill  . albuterol (PROVENTIL HFA;VENTOLIN HFA) 108 (90 Base) MCG/ACT inhaler Inhale 2 puffs by mouth every 6 hours as needed for wheezing or shortness of breath. **Yearly physical due in June** 1 Inhaler 11  . amLODipine (NORVASC) 10 MG tablet Take 1 tablet (10 mg total) by mouth daily. 90 tablet 3  . apixaban (ELIQUIS) 5 MG TABS tablet Take 1 tablet (5 mg total) by mouth 2 (two) times daily. 180 tablet 1  . cetirizine (ZYRTEC) 10 MG tablet Take 10 mg by mouth daily.     . Cholecalciferol (VITAMIN D) 2000 units tablet Take 2,000 Units by mouth daily.    . Cyanocobalamin (VITAMELTS ENERGY VITAMIN B-12) 1500 MCG TBDP Take 1,500 mcg by mouth daily.    Marland Kitchen dimenhyDRINATE (DRAMAMINE) 50 MG tablet Take 50 mg by mouth daily as needed for nausea.    Marland Kitchen escitalopram (LEXAPRO) 20 MG tablet Take 1 tablet (20 mg total) by mouth daily. 9000 tablet 3  . estradiol (ESTRACE) 2 MG tablet Take 1 tablet (2 mg total) by mouth daily. 90 tablet 3  . flecainide (TAMBOCOR) 50 MG tablet Take 1 tablet (50 mg total) by mouth 2 (two) times daily. 180 tablet 3  . furosemide (LASIX) 40 MG tablet Take 1 tablet (40 mg total) by mouth daily. 90 tablet 3  . Garlic 7062 MG CAPS Take 1,000 mg by mouth at bedtime.     Marland Kitchen guaiFENesin (MUCINEX) 600 MG 12 hr tablet Take 1 tablet (600 mg total) by mouth 2 (two) times daily as needed for  cough or to loosen phlegm. 60 tablet 2  . ipratropium-albuterol (DUONEB) 0.5-2.5 (3) MG/3ML SOLN Take 3 mLs by nebulization 4 (four) times daily. 120 mL 5  . irbesartan (AVAPRO) 150 MG tablet Take 1 tablet (150 mg total) by mouth daily. 90 tablet 0  . LORazepam (ATIVAN) 1 MG tablet  Take 1 tablet by mouth at bedtime as needed for anxiety. 90 tablet 1  . metoprolol tartrate (LOPRESSOR) 50 MG tablet Take 1.5 tablets (75 mg total) by mouth 2 (two) times daily. 270 tablet 3  . nystatin (MYCOSTATIN/NYSTOP) powder Use as directed twice daily as needed 30 g 1  . Omega-3 Fatty Acids (FISH OIL) 1200 MG CAPS Take 1,200 mg by mouth daily.     . pantoprazole (PROTONIX) 40 MG tablet Take 1 tablet (40 mg total) by mouth daily. 90 tablet 1  . potassium chloride SA (K-DUR,KLOR-CON) 20 MEQ tablet Take 1 tablet (20 mEq total) by mouth daily. 90 tablet 3  . pravastatin (PRAVACHOL) 40 MG tablet Take 1 tablet (40 mg total) by mouth daily. 90 tablet 0  . Tetrahydrozoline HCl (VISINE OP) Place 1 drop into both eyes daily as needed (dry eyes).    . vitamin E 400 UNIT capsule Take 400 Units by mouth daily.    Marland Kitchen zolpidem (AMBIEN) 5 MG tablet Take 1 tablet (5 mg total) by mouth at bedtime as needed for sleep. 90 tablet 1   No current facility-administered medications for this visit.     Allergies:   Tramadol hcl; Codeine; Tape; and Vicodin [hydrocodone-acetaminophen]    Social History:  The patient  reports that she quit smoking about 14 years ago. Her smoking use included cigarettes. She has a 45.00 pack-year smoking history. She has never used smokeless tobacco. She reports that she drinks alcohol. She reports that she does not use drugs.   Family History:  The patient's family history includes Colon polyps in her mother; Diabetes in her mother; Hyperlipidemia in her mother; Hypertension in her mother; Stroke in her father.    ROS:  General:no colds or fevers, no weight changes Skin:no rashes or ulcers HEENT:no  blurred vision, no congestion CV:see HPI PUL:see HPI GI:no diarrhea constipation or melena, no indigestion GU:no hematuria, no dysuria MS:no joint pain, no claudication Neuro:no syncope, no lightheadedness Endo:no diabetes, no thyroid disease  Wt Readings from Last 3 Encounters:  09/27/17 272 lb (123.4 kg)  09/15/17 270 lb (122.5 kg)  08/24/17 250 lb (113.4 kg)     PHYSICAL EXAM: VS:  BP 130/74   Pulse 86   Ht 5\' 2"  (1.575 m)   Wt 272 lb (123.4 kg)   SpO2 97%   BMI 49.75 kg/m  , BMI Body mass index is 49.75 kg/m. General:Pleasant affect, NAD Skin:Warm and dry, brisk capillary refill HEENT:normocephalic, sclera clear, mucus membranes moist Neck:supple, no JVD, no bruits  Heart irreg irreg without murmur, gallup, rub or click Lungs:clear without rales, rhonchi, or wheezes SWF:UXNA, non tender, + BS, do not palpate liver spleen or masses Ext:2-3+ lower ext edema, unable to palpate pedal pulses due to edema, 2+ radial pulses Neuro:alert and oriented X 3, MAE, follows commands, + facial symmetry    EKG:  EKG is ordered today. The ekg ordered today demonstrates a fib with aberrancy no acute changes rate is controlled.      Recent Labs: 03/17/2017: Hemoglobin 12.8; Platelets 283.0; TSH 3.54 09/15/2017: ALT 12; BUN 13; Creatinine, Ser 0.66; Potassium 4.6; Sodium 143    Lipid Panel    Component Value Date/Time   CHOL 151 09/15/2017 1511   TRIG 164.0 (H) 09/15/2017 1511   HDL 48.60 09/15/2017 1511   CHOLHDL 3 09/15/2017 1511   VLDL 32.8 09/15/2017 1511   LDLCALC 69 09/15/2017 1511   LDLDIRECT 141.0 10/16/2015 1437       Other studies  Reviewed: Additional studies/ records that were reviewed today include: . Echo 01/2014  Study Conclusions  - Left ventricle: Inferobasal hypokinesis. The cavity size was normal. Wall thickness was normal. Systolic function was normal. The estimated ejection fraction was in the range of 50% to 55%. - Left atrium: The atrium was  mildly dilated. - Atrial septum: No defect or patent foramen ovale was identified. - Pulmonary arteries: PA peak pressure: 41 mm Hg (S).   EF on [place] is 52% 11/2014.   nuc study 04/2014  Overall Impression: Low risk stress nuclear study with small size and mild intensity fixed distal anteroapical breast attenuation artifact.  LV Ejection Fraction: 44%. LV Wall Motion: Mild global hypokinesis     ASSESSMENT AND PLAN:  1.  Atrial fib rate controlled, but significant lower ext edema.  May be due to a fib.  She is SOB as well no rales, and she does have COPD.  She has been on Eliquis for 2 months except for 1 day 2 weeks ago.  Will review with Dr. Johnsie Cancel but with so much edema feel she would benefit from DCCV.  Will check echo as well with increased edema more recently  Will see her back in 2 weeks and arrange DCCV if Dr. Johnsie Cancel agrees  2.  Lower ext edema 2-3+ will increase lasix to 80 mg daily for 3 days then back to 40 mg daily.  Recent labs were excellent.   3.  Anticoagulation on Eliquis now can afford.  4.  HTN controlled  5.   COPD per Pulmonary    Current medicines are reviewed with the patient today.  The patient Has no concerns regarding medicines.  The following changes have been made:  See above Labs/ tests ordered today include:see above  Disposition:   FU:  see above  Signed, Cecilie Kicks, NP  09/27/2017 4:01 PM    [facility] [place], [place], [place] Phone: [phone]; Fax: [phone]

## 2017-10-02 ENCOUNTER — Other Ambulatory Visit: Payer: Self-pay | Admitting: Internal Medicine

## 2017-10-05 ENCOUNTER — Ambulatory Visit (HOSPITAL_COMMUNITY): Payer: Medicare Other | Attending: Cardiovascular Disease

## 2017-10-05 DIAGNOSIS — I071 Rheumatic tricuspid insufficiency: Secondary | ICD-10-CM | POA: Diagnosis not present

## 2017-10-05 DIAGNOSIS — I4891 Unspecified atrial fibrillation: Secondary | ICD-10-CM | POA: Insufficient documentation

## 2017-10-05 DIAGNOSIS — R609 Edema, unspecified: Secondary | ICD-10-CM | POA: Diagnosis not present

## 2017-10-05 DIAGNOSIS — R0602 Shortness of breath: Secondary | ICD-10-CM | POA: Diagnosis not present

## 2017-10-08 ENCOUNTER — Other Ambulatory Visit: Payer: Self-pay | Admitting: Cardiovascular Disease

## 2017-10-13 ENCOUNTER — Ambulatory Visit (INDEPENDENT_AMBULATORY_CARE_PROVIDER_SITE_OTHER): Payer: Medicare Other | Admitting: Cardiology

## 2017-10-13 ENCOUNTER — Encounter: Payer: Self-pay | Admitting: *Deleted

## 2017-10-13 ENCOUNTER — Encounter: Payer: Self-pay | Admitting: Cardiology

## 2017-10-13 VITALS — BP 126/74 | HR 118 | Ht 62.0 in | Wt 260.0 lb

## 2017-10-13 DIAGNOSIS — I481 Persistent atrial fibrillation: Secondary | ICD-10-CM

## 2017-10-13 DIAGNOSIS — I4819 Other persistent atrial fibrillation: Secondary | ICD-10-CM

## 2017-10-13 NOTE — Patient Instructions (Addendum)
Medication Instructions:   Your physician recommends that you continue on your current medications as directed. Please refer to the Current Medication list given to you today.   If you need a refill on your cardiac medications before your next appointment, please call your pharmacy.  Labwork:  NONE ORDERED  TODAY    Testing/Procedures: SEE LETTER FOR CARDIOVERSION Your physician has recommended that you have a Cardioversion (DCCV). Electrical Cardioversion uses a jolt of electricity to your heart either through paddles or wired patches attached to your chest. This is a controlled, usually prescheduled, procedure. Defibrillation is done under light anesthesia in the hospital, and you usually go home the day of the procedure. This is done to get your heart back into a normal rhythm. You are not awake for the procedure. Please see the instruction sheet given to you today.      Follow-Up:  2 WEEKS POST  CARDIOVERSION WITH AFIB CLINIC    Any Other Special Instructions Will Be Listed Below (If Applicable).

## 2017-10-13 NOTE — Progress Notes (Signed)
10/13/2017 Andrea Perry   [birth year]  [id number]  Primary Physician Jenny Reichmann Hunt Oris, MD Primary Cardiologist: Johnsie Cancel   Reason for Visit/CC: Persistent Atrial Fibrillation   HPI:  Andrea Perry is a 67 y.o. female who is being seen today for evaluation for possible DCCV for persistent atrial fibrillation, at the request of Cecilie Kicks, NP.   She is followed by Dr. Johnsie Cancel and has know PAF. She is on Flecainide and Eliquis. She had a Lexiscan NST 04/2014 that showed no ischemia or infarction. EF was 40% but f/u MUGA test showed normal EF at 52%. She has had several cardioversion in the past.   She was seen by Cecilie Kicks on 09/27/17 for dyspnea and bilateral LEE. She was noted to be back in afib w/ CVR and in acute on chronic diastolic HF, felt likely secondary to being in afib. She was instructed to increase her home Lasix to 80 mg daily x 3 days, then back to 40 mg daily. This resulted in improvement in LEE. Pt reports her legs are back to baseline but exertional dyspnea still present. A 2D echo was obtained to reassess LVEF. LV systolic function normal. LA size normal. Repeat DCCV was recommended at last OV, however pt had held her Eliquis, 10 days prior to last OV 09/27/17, due to a minor surgery. Pt advised to f/u again today for repeat EKG and to discuss DCCV. The patient remains in afib w/ a CVR. She reports full compliance w/ Eliquis for the last 3 weeks w/o interruption.   Current Meds  Medication Sig  . albuterol (PROVENTIL HFA;VENTOLIN HFA) 108 (90 Base) MCG/ACT inhaler Inhale 2 puffs by mouth every 6 hours as needed for wheezing or shortness of breath. **Yearly physical due in June**  . amLODipine (NORVASC) 10 MG tablet Take 1 tablet (10 mg total) by mouth daily.  Marland Kitchen apixaban (ELIQUIS) 5 MG TABS tablet Take 1 tablet (5 mg total) by mouth 2 (two) times daily.  . cetirizine (ZYRTEC) 10 MG tablet Take 10 mg by mouth daily.   . Cholecalciferol (VITAMIN D) 2000 units tablet Take 2,000 Units  by mouth daily.  . Cyanocobalamin (VITAMELTS ENERGY VITAMIN B-12) 1500 MCG TBDP Take 1,500 mcg by mouth daily.  Marland Kitchen dimenhyDRINATE (DRAMAMINE) 50 MG tablet Take 50 mg by mouth daily as needed for nausea.  Marland Kitchen escitalopram (LEXAPRO) 20 MG tablet Take 1 tablet (20 mg total) by mouth daily.  Marland Kitchen estradiol (ESTRACE) 2 MG tablet Take 1 tablet (2 mg total) by mouth daily.  . flecainide (TAMBOCOR) 50 MG tablet Take 1 tablet (50 mg total) by mouth 2 (two) times daily.  . furosemide (LASIX) 40 MG tablet Take 1 tablet (40 mg total) by mouth daily.  . Garlic 4098 MG CAPS Take 1,000 mg by mouth at bedtime.   Marland Kitchen guaiFENesin (MUCINEX) 600 MG 12 hr tablet Take 1 tablet (600 mg total) by mouth 2 (two) times daily as needed for cough or to loosen phlegm.  Marland Kitchen ipratropium-albuterol (DUONEB) 0.5-2.5 (3) MG/3ML SOLN Take 3 mLs by nebulization 4 (four) times daily.  . irbesartan (AVAPRO) 150 MG tablet Take 1 tablet (150 mg total) by mouth daily.  Marland Kitchen LORazepam (ATIVAN) 1 MG tablet Take 1 tablet by mouth at bedtime as needed for anxiety.  . metoprolol tartrate (LOPRESSOR) 50 MG tablet Take 1.5 tablets (75 mg total) by mouth 2 (two) times daily.  Marland Kitchen nystatin (MYCOSTATIN/NYSTOP) powder Use as directed twice daily as needed  . Omega-3 Fatty Acids (FISH  OIL) 1200 MG CAPS Take 1,200 mg by mouth daily.   . pantoprazole (PROTONIX) 40 MG tablet Take 1 tablet (40 mg total) by mouth daily.  . potassium chloride SA (K-DUR,KLOR-CON) 20 MEQ tablet Take 1 tablet (20 mEq total) by mouth daily.  . pravastatin (PRAVACHOL) 40 MG tablet Take 1 tablet (40 mg total) by mouth daily.  . Tetrahydrozoline HCl (VISINE OP) Place 1 drop into both eyes daily as needed (dry eyes).  . vitamin E 400 UNIT capsule Take 400 Units by mouth daily.  Marland Kitchen zolpidem (AMBIEN) 5 MG tablet Take 1 tablet (5 mg total) by mouth at bedtime as needed for sleep.   Allergies  Allergen Reactions  . Tramadol Hcl Nausea And Vomiting and Other (See Comments)     mouth dryness,  headache  . Codeine Nausea And Vomiting  . Tape Rash    Plastic tape, bandaids and ekg leads, causes redness and rash  . Vicodin [Hydrocodone-Acetaminophen] Nausea And Vomiting   Past Medical History:  Diagnosis Date  . ALLERGIC RHINITIS 08/16/2008  . ANXIETY 08/16/2008  . Arthritis    hands, knees, lower back  . ASTHMA 08/16/2008  . Asthma    BRONCHITIS     DR. Lamonte Sakai    . Bladder leak   . Cancer ([place])    MELANOMA      . Chronic lower back pain    problems with disc L2-5  . COPD 08/16/2008  . DEPRESSION 08/16/2008  . Dysrhythmia    hx AF  . Excessive daytime sleepiness 06/19/2015  . GENITAL HERPES 12/03/2006  . GERD (gastroesophageal reflux disease)   . H/O hiatal hernia   . HEMATOCHEZIA 06/20/2009  . Hemorrhoids   . History of cardioversion 10/30/14  . History of kidney stones   . HYPERLIPIDEMIA 08/16/2008  . HYPERTENSION 12/03/2006  . Hypertension   . Impaired glucose tolerance 09/21/2010  . Overactive bladder 10/20/2015  . Pneumonia    as a child  . Pulmonary HTN ([place]) 06/19/2015  . Shortness of breath dyspnea   . Sleep apnea    MILD NO CPAP ORDERED  . Snoring 06/19/2015   Family History  Problem Relation Age of Onset  . Diabetes Mother   . Hyperlipidemia Mother   . Hypertension Mother   . Colon polyps Mother   . Stroke Father   . Colon cancer Neg Hx    Past Surgical History:  Procedure Laterality Date  . ABDOMINAL HYSTERECTOMY    . APPENDECTOMY    . CARDIOVERSION N/A 03/20/2014   Procedure: CARDIOVERSION;  Surgeon: Josue Hector, MD;  Location: [facility] ENDOSCOPY;  Service: Cardiovascular;  Laterality: N/A;  . CARDIOVERSION N/A 10/30/2014   Procedure: CARDIOVERSION;  Surgeon: Josue Hector, MD;  Location: [facility] ENDOSCOPY;  Service: Cardiovascular;  Laterality: N/A;  . CARPAL TUNNEL RELEASE     Right + LEFT  . CESAREAN SECTION     x 1   . COLONOSCOPY  2004  . CYST EXCISION     RT ARM   . CYSTOCELE REPAIR N/A 10/25/2012   Procedure: ANTERIOR REPAIR (CYSTOCELE);  Surgeon: Gus Height, MD;  Location: [facility];  Service: Gynecology;  Laterality: N/A;  . HEEL SPUR SURGERY Bilateral   . KNEE SURGERY     Right + LEFT  . LUMBAR LAMINECTOMY/DECOMPRESSION MICRODISCECTOMY N/A 03/19/2016   Procedure: Laminectomy and Foraminotomy - Lumbar three-four - Lumbar four-five;  Surgeon: Eustace Moore, MD;  Location: [facility];  Service: Neurosurgery;  Laterality: N/A;  . RADIOLOGY  WITH ANESTHESIA Right 11/01/2014   Procedure: MRI RIGHT FOREARM;  Surgeon: Medication Radiologist, MD;  Location: [facility];  Service: Radiology;  Laterality: Right;  . RADIOLOGY WITH ANESTHESIA Right 08/29/2015   Procedure: MRI - RIGHT FOREARM;  Surgeon: Medication Radiologist, MD;  Location: [facility];  Service: Radiology;  Laterality: Right;  . RADIOLOGY WITH ANESTHESIA N/A 12/05/2015   Procedure: MRI SPINE WITHOUT;  Surgeon: Medication Radiologist, MD;  Location: [facility];  Service: Radiology;  Laterality: N/A;  . SKIN CANCER EXCISION     BILAT SHOULDERS  . SPLIT NIGHT STUDY  09/04/2015  . TONSILLECTOMY AND ADENOIDECTOMY     Social History   Socioeconomic History  . Marital status: Widowed    Spouse name: Not on file  . Number of children: 1  . Years of education: Not on file  . Highest education level: Not on file  Occupational History  . Occupation: currently unemployed, former [employer]  Social Needs  . Financial resource strain: Not on file  . Food insecurity:    Worry: Not on file    Inability: Not on file  . Transportation needs:    Medical: Not on file    Non-medical: Not on file  Tobacco Use  . Smoking status: Former Smoker    Packs/day: 1.50    Years: 30.00    Pack years: 45.00    Types: Cigarettes    Last attempt to quit: 04/21/2003    Years since quitting: 14.4  . Smokeless tobacco: Never Used  Substance and Sexual Activity  . Alcohol use: Yes    Comment: OCCAS  . Drug use: No  . Sexual activity: Not Currently    Birth control/protection: None  Lifestyle  . Physical  activity:    Days per week: Not on file    Minutes per session: Not on file  . Stress: Not on file  Relationships  . Social connections:    Talks on phone: Not on file    Gets together: Not on file    Attends religious service: Not on file    Active member of club or organization: Not on file    Attends meetings of clubs or organizations: Not on file    Relationship status: Not on file  . Intimate partner violence:    Fear of current or ex partner: Not on file    Emotionally abused: Not on file    Physically abused: Not on file    Forced sexual activity: Not on file  Other Topics Concern  . Not on file  Social History Narrative   3 caffeine drinks daily      Review of Systems: General: negative for chills, fever, night sweats or weight changes.  Cardiovascular: negative for chest pain, dyspnea on exertion, edema, orthopnea, palpitations, paroxysmal nocturnal dyspnea or shortness of breath Dermatological: negative for rash Respiratory: negative for cough or wheezing Urologic: negative for hematuria Abdominal: negative for nausea, vomiting, diarrhea, bright red blood per rectum, melena, or hematemesis Neurologic: negative for visual changes, syncope, or dizziness All other systems reviewed and are otherwise negative except as noted above.   Physical Exam:  Blood pressure 126/74, pulse (!) 118, height 5\' 2"  (1.575 m), weight 260 lb (117.9 kg), SpO2 94 %.  General appearance: alert, cooperative, no distress and morbidly obese Neck: no carotid bruit and no JVD Lungs: clear to auscultation bilaterally Heart: irregularly irregular rhythm and regular rate Extremities: extremities normal, atraumatic, no cyanosis or edema Pulses: 2+ and symmetric Skin:  Skin color, texture, turgor normal. No rashes or lesions Neurologic: Grossly normal  EKG atrial fibrillation 74 bpm -- personally reviewed   ASSESSMENT AND PLAN:   1. Persistent Atrial Fibrillation: pt remains in atrial  fibrillation w/ CVR. HR in the 70s. She reports full compliance with Flecainide, metoprolol and Eliquis. No missed doses of Eliquis in the last 3 weeks. Recent echo showed normal LVF and normal LA size. We will plan DCCV at [facility] in an effort to restore NSR. F/u post cardioversion in the Afib Clinic. If her afib returns, may consider further titration of Flecainide vs change to another AAD, such as Tikosyn.   2. Chronic Diastolic HF: volume improved after increase in Lasix to 80 mg x 3 days. She is back to 40 mg daily. LEE resolved. Still SOB w/ exertion, likely 2/2 afib. Appears euvolemic on exam. Lungs are CTAB.   Follow-Up in afib clinic post cardioversion.   Shameka Aggarwal Ladoris Gene, MHS [facility] 10/13/2017 12:08 PM

## 2017-10-19 ENCOUNTER — Other Ambulatory Visit: Payer: Self-pay | Admitting: Internal Medicine

## 2017-10-22 ENCOUNTER — Telehealth: Payer: Self-pay | Admitting: Cardiovascular Disease

## 2017-10-22 NOTE — Telephone Encounter (Signed)
New Message:      Pt is calling and needing to cancel her appt on the 9th at the hospital

## 2017-10-22 NOTE — Telephone Encounter (Signed)
Patient stated she cannot afford her eliquis at this time and wants to cancel her Cardioversion. Patient has f/u with Roderic Palau NP in A. FIB clinic later this month. Will send message to Jeani Hawking if there is a way to get some financial assistance with her medication. Patient state she wants to have cardioversion, but she will have to reschedule at a later date when she gets her medication situation straighten out.

## 2017-10-25 NOTE — Telephone Encounter (Signed)
Called patient back. Encouraged patient to keep her appointment with Roderic Palau NP this month and start taking her Eliquis BID. Informed patient at her visit with Roderic Palau NP that she can go into more detail about the cardioversion and they can reschedule cardioversion to a date 4 weeks from her finally starting Eliquis. Patient stated she would start taking Eliquis today. Will forward to Roderic Palau NP, so she is aware.

## 2017-10-25 NOTE — Telephone Encounter (Addendum)
**Note De-Identified Marcine Gadway Obfuscation** I called the pts pharmacy, [facility], and was advised that a PA is not required and that a 30 day supply of Eliquis will cost the pt $37.  I called the pt who states that she can afford $37 a month for her Eliquis but she is concerned that she will go into the donut hole.  She is advised to call me back once she goes into the donut hole and I will help her apply for pt asst. She verbalized understanding and thanked me for calling her back.  She is requesting that Dr Mariana Arn nurse call her as she has several questions concerning her Cardioversion.  Will forward this message to [facility], Dr Mariana Arn nurse.

## 2017-10-26 ENCOUNTER — Encounter (HOSPITAL_COMMUNITY): Admission: RE | Payer: Self-pay | Source: Ambulatory Visit

## 2017-10-26 ENCOUNTER — Ambulatory Visit (HOSPITAL_COMMUNITY): Admission: RE | Admit: 2017-10-26 | Payer: Medicare Other | Source: Ambulatory Visit | Admitting: Cardiology

## 2017-10-26 SURGERY — CARDIOVERSION
Anesthesia: General

## 2017-11-08 ENCOUNTER — Encounter: Payer: Self-pay | Admitting: Sports Medicine

## 2017-11-08 ENCOUNTER — Ambulatory Visit (INDEPENDENT_AMBULATORY_CARE_PROVIDER_SITE_OTHER): Payer: Medicare Other | Admitting: Sports Medicine

## 2017-11-08 VITALS — BP 119/74 | Ht 62.0 in | Wt 250.0 lb

## 2017-11-08 DIAGNOSIS — M25512 Pain in left shoulder: Secondary | ICD-10-CM

## 2017-11-08 MED ORDER — METHYLPREDNISOLONE ACETATE 40 MG/ML IJ SUSP
40.0000 mg | Freq: Once | INTRAMUSCULAR | Status: AC
Start: 2017-11-08 — End: 2017-11-08
  Administered 2017-11-08: 40 mg via INTRA_ARTICULAR

## 2017-11-08 MED ORDER — MELOXICAM 15 MG PO TABS: ORAL_TABLET | ORAL | 0 refills | Status: DC

## 2017-11-08 NOTE — Progress Notes (Signed)
   Subjective:    Patient ID: [name], female    DOB: Feb 08, 1951, 67 y.o.   MRN: [id number]  HPI chief complaint: Left shoulder pain  Patient comes in today requesting a repeat cortisone injection into the left shoulder.She was last seen in the office in May. Bilateral subacromial cortisone injections were administered at that time. Both injections were helpful but the left shoulder pain is starting to return. She denies any recent trauma. Pain is diffuse. Worse with activity. Mild nighttime pain. It is identical in nature to what she has experienced previously.   Review of Systems As above    Objective:   Physical Exam  Well-developed, well-nourished. No acute distress. Awake alert and oriented 3. Vital signs reviewed  Left shoulder: Patient has good range of motion but does have a positive painful arc. Positive empty can, positive Hawkins. Rotator cuff strength is 5/5. No tenderness over the bicipital groove. No tenderness over the acromioclavicular joint. Neurovascularly intact distally.      Assessment & Plan:   Returning left shoulder pain secondary to rotator cuff tendinopathy/partial tearing  Patient's left subacromial space was injected with cortisone. She tolerated this without difficulty. If she only receives a few weeks of pain relief with today's injection and we will need to get updated imaging including an x-ray and an ultrasound. Patient understands. Follow-up as needed.  Consent obtained and verified. Time-out conducted. Noted no overlying erythema, induration, or other signs of local infection. Skin prepped in a sterile fashion. Topical analgesic spray: Ethyl chloride. Joint: left shoulder (subacromial) Needle: 25g 1.5 inch Completed without difficulty. Meds: 3cc 1% xylocaine, 1cc (40mg ) depomedrol  Advised to call if fevers/chills, erythema, induration, drainage, or persistent bleeding.   Consent obtained and verified. Time-out conducted. Noted no  overlying erythema, induration, or other signs of local infection. Skin prepped in a sterile fashion. Topical analgesic spray: Ethyl chloride. Joint: left subacromial Needle: 25g 1.5 inch Completed without difficulty. Meds: 3cc 1% xylocaine, 1cc (40mg ) depomedrol  Advised to call if fevers/chills, erythema, induration, drainage, or persistent bleeding.

## 2017-11-09 ENCOUNTER — Ambulatory Visit (HOSPITAL_COMMUNITY): Payer: Medicare Other | Admitting: Nurse Practitioner

## 2017-12-05 ENCOUNTER — Other Ambulatory Visit: Payer: Self-pay | Admitting: Cardiovascular Disease

## 2017-12-06 NOTE — Addendum Note (Signed)
Addended by: Maren Beach, Nashley Cordoba A on: 12/06/2017 03:50 PM   Modules accepted: Orders

## 2017-12-07 DIAGNOSIS — J449 Chronic obstructive pulmonary disease, unspecified: Secondary | ICD-10-CM | POA: Diagnosis not present

## 2017-12-09 DIAGNOSIS — L720 Epidermal cyst: Secondary | ICD-10-CM | POA: Diagnosis not present

## 2018-01-01 DIAGNOSIS — J449 Chronic obstructive pulmonary disease, unspecified: Secondary | ICD-10-CM | POA: Diagnosis not present

## 2018-01-03 ENCOUNTER — Ambulatory Visit (INDEPENDENT_AMBULATORY_CARE_PROVIDER_SITE_OTHER): Payer: Medicare Other | Admitting: Emergency Medicine

## 2018-01-03 ENCOUNTER — Encounter: Payer: Self-pay | Admitting: Emergency Medicine

## 2018-01-03 DIAGNOSIS — J301 Allergic rhinitis due to pollen: Secondary | ICD-10-CM

## 2018-01-03 DIAGNOSIS — R05 Cough: Secondary | ICD-10-CM | POA: Diagnosis not present

## 2018-01-03 DIAGNOSIS — R053 Chronic cough: Secondary | ICD-10-CM

## 2018-01-03 DIAGNOSIS — G4733 Obstructive sleep apnea (adult) (pediatric): Secondary | ICD-10-CM | POA: Diagnosis not present

## 2018-01-03 DIAGNOSIS — K219 Gastro-esophageal reflux disease without esophagitis: Secondary | ICD-10-CM | POA: Diagnosis not present

## 2018-01-03 MED ORDER — FLUTICASONE PROPIONATE 50 MCG/ACT NA SUSP: 2.0000 | Freq: Every day | NASAL | 2 refills | Status: DC

## 2018-01-03 MED ORDER — PREDNISONE 20 MG PO TABS: 20.0000 mg | ORAL_TABLET | Freq: Every day | ORAL | 0 refills | Status: DC

## 2018-01-03 NOTE — Patient Instructions (Addendum)
Please continue your zyrtec daily Start fluticasone nasal spray, 2 sprays each nostril daily Continue pantoprazole 40 mg daily as you have been taking it.  Take this medication 1 hour before eating Take prednisone 20mg  daily for 5 days and then stop.  Continue DuoNeb 4 times a day. Keep Ventolin available to use 2 puffs up to every 4 hours if needed for shortness of breath, cough, wheeze. We talked about possibly repeating your sleep study today.  Consider this and we can revisit, discuss further at your next visit Follow with Dr Lamonte Sakai in 2 months or sooner if you have any problems.

## 2018-01-03 NOTE — Assessment & Plan Note (Signed)
She never started CPAP, her AHI was less than 5/h.  She may need a repeat sleep study to ensure that CPAP is not indicated.  If she does require then she will need nasal pillows because she has claustrophobia and is concerned about whether she can tolerate a standard mask.

## 2018-01-03 NOTE — Assessment & Plan Note (Signed)
Continue Protonix at current dosing.  If she continues to have breakthrough cough then it may be reasonable to empirically ramp up her GERD therapy.

## 2018-01-03 NOTE — Progress Notes (Signed)
Subjective:    Patient ID: Andrea Perry, female    DOB: December 09, 1950, 67 y.o.   MRN: [id number]  HPI  ROV 01/03/18 --this follow-up visit for 67 year old woman with a history of former tobacco use and cotton dust exposure, COPD with an asthmatic component.  She also has mild obstructive sleep apnea but is not currently on CPAP.  She has allergic rhinitis which contributes to some of her dyspnea and labile symptoms.   She reports that for a couple months she has had more cough, sometimes productive. A lot of throat clearing. More exertional SOB. Feels that her GERD and rhinitis are fairly well controlled. She has gained 6 lbs since last time. She is on DuoNeb qid, ventolin prn, about 3-4x a day.   Review of Systems  Constitutional: Negative for fever and unexpected weight change.  HENT: Negative for congestion, dental problem, ear pain, nosebleeds, postnasal drip, rhinorrhea, sinus pressure, sneezing, sore throat and trouble swallowing.   Eyes: Negative for redness and itching.  Respiratory: Positive for cough and shortness of breath. Negative for chest tightness and wheezing.   Cardiovascular: Negative for palpitations and leg swelling.  Gastrointestinal: Negative for nausea and vomiting.  Genitourinary: Negative for dysuria.  Musculoskeletal: Negative for joint swelling.  Skin: Negative for rash.  Neurological: Negative for headaches.  Hematological: Does not bruise/bleed easily.  Psychiatric/Behavioral: Negative for dysphoric mood. The patient is not nervous/anxious.        Objective:   Physical Exam Vitals:   01/03/18 1409  BP: 132/68  Pulse: (!) 114  SpO2: 94%  Weight: 261 lb (118.4 kg)  Height: 5\' 2"  (1.575 m)   Gen: Pleasant, overwt,  in no distress,  normal affect  ENT: No lesions,  mouth clear,  oropharynx clear, no postnasal drip  Neck: No JVD, soft stridor present  Lungs: No use of accessory muscles, no crackles or wheezes.   Cardiovascular: RRR, heart sounds  normal, no murmur or gallops, no peripheral edema  Musculoskeletal: No deformities, no cyanosis or clubbing  Neuro: alert, non focal  Skin: Warm, no lesions or rashes      Assessment & Plan:  Chronic cough This appears to be a flare of her chronic cough.  She has upper airway noise and a lot of throat clearing without any true wheeze on exam.  I like to treat her with a short course of prednisone to see if we can calm the upper airway inflammation, add to her rhinitis therapy as below.  Continue GERD therapy.  We may decide to empirically increase to GERD therapy at some point in the future.  Allergic rhinitis Continue Zyrtec, add fluticasone nasal spray as she has breakthrough symptoms that are likely contributing to her chronic cough.  COPD (chronic obstructive pulmonary disease) Progressive dyspnea, could reflect some effects of obesity, deconditioning but certainly consider progression of her obstructive lung disease.  We do not make some improvement in her overall status by addressing her chronic cough and upper airway symptoms then she likely needs repeat pulmonary function testing.  We could consider changing over her nebulized medication to longer acting bronchodilators.  She is been on these in the past and preferred the nebulizer delivery system.  Mild obstructive sleep apnea She never started CPAP, her AHI was less than 5/h.  She may need a repeat sleep study to ensure that CPAP is not indicated.  If she does require then she will need nasal pillows because she has claustrophobia and is concerned about whether  she can tolerate a standard mask.  GERD (gastroesophageal reflux disease) Continue Protonix at current dosing.  If she continues to have breakthrough cough then it may be reasonable to empirically ramp up her GERD therapy.  Baltazar Apo, MD, PhD 01/03/2018, 2:36 PM [facility] [phone] or if no answer [phone]

## 2018-01-03 NOTE — Assessment & Plan Note (Signed)
Progressive dyspnea, could reflect some effects of obesity, deconditioning but certainly consider progression of her obstructive lung disease.  We do not make some improvement in her overall status by addressing her chronic cough and upper airway symptoms then she likely needs repeat pulmonary function testing.  We could consider changing over her nebulized medication to longer acting bronchodilators.  She is been on these in the past and preferred the nebulizer delivery system.

## 2018-01-03 NOTE — Assessment & Plan Note (Signed)
This appears to be a flare of her chronic cough.  She has upper airway noise and a lot of throat clearing without any true wheeze on exam.  I like to treat her with a short course of prednisone to see if we can calm the upper airway inflammation, add to her rhinitis therapy as below.  Continue GERD therapy.  We may decide to empirically increase to GERD therapy at some point in the future.

## 2018-01-03 NOTE — Assessment & Plan Note (Signed)
Continue Zyrtec, add fluticasone nasal spray as she has breakthrough symptoms that are likely contributing to her chronic cough.

## 2018-01-04 ENCOUNTER — Other Ambulatory Visit: Payer: Self-pay | Admitting: Internal Medicine

## 2018-01-11 ENCOUNTER — Other Ambulatory Visit: Payer: Self-pay | Admitting: Internal Medicine

## 2018-01-17 ENCOUNTER — Ambulatory Visit (INDEPENDENT_AMBULATORY_CARE_PROVIDER_SITE_OTHER): Payer: Medicare Other | Admitting: Sports Medicine

## 2018-01-17 ENCOUNTER — Ambulatory Visit
Admission: RE | Admit: 2018-01-17 | Discharge: 2018-01-17 | Disposition: A | Discharge status: Home / Self Care | Payer: Medicare Other | Source: Ambulatory Visit | Attending: Sports Medicine | Admitting: Sports Medicine

## 2018-01-17 VITALS — BP 117/68 | Ht 62.0 in | Wt 260.0 lb

## 2018-01-17 DIAGNOSIS — S8001XA Contusion of right knee, initial encounter: Secondary | ICD-10-CM | POA: Diagnosis not present

## 2018-01-17 DIAGNOSIS — M25561 Pain in right knee: Secondary | ICD-10-CM | POA: Diagnosis not present

## 2018-01-17 MED ORDER — MELOXICAM 15 MG PO TABS: ORAL_TABLET | ORAL | 0 refills | Status: DC

## 2018-01-17 NOTE — Progress Notes (Signed)
   Subjective:    Patient ID: Andrea Perry, female    DOB: [birth year], 67 y.o.   MRN: [id number]  HPI chief complaint: Right knee pain  Andrea Perry comes in today complaining of 1 day of right knee pain and swelling.  She fell while coming in the door at home.  She fell directly onto both knees and now complains of pain as well as bruising and swelling in the anterior aspect of the right knee.  She is able to bear weight but it is painful.  She has been icing which has been helpful.  Past medical history reviewed Medications reviewed Allergies reviewed    Review of Systems As above    Objective:   Physical Exam  Well-developed, well-nourished.  No acute distress.  Awake alert and oriented x3.  Vital signs reviewed  Right knee: There is ecchymosis over the right patella.  She is tender to palpation in this area as well as a little more distally where she has a palpable hematoma.  Knee is stable to ligamentous exam.  No effusion.  Neurovascular intact distally.  Walking with a slight limp.  X-rays of the right knee shows soft tissue swelling and a trace effusion.  No fracture.  Moderately advanced degenerative changes.    Assessment & Plan:   Right knee pain and swelling secondary to contusion Right knee hematoma  Patient is on Eliquis which has contributed to her hematoma formation.  I recommend an Ace wrap for compression along with ice and elevation.  Reassurance regarding x-rays.  Follow-up in 2 weeks for reevaluation.

## 2018-01-19 ENCOUNTER — Ambulatory Visit: Payer: Medicare Other | Admitting: Emergency Medicine

## 2018-01-31 ENCOUNTER — Ambulatory Visit: Payer: Medicare Other | Admitting: Sports Medicine

## 2018-02-03 ENCOUNTER — Ambulatory Visit (INDEPENDENT_AMBULATORY_CARE_PROVIDER_SITE_OTHER): Payer: Medicare Other | Admitting: Sports Medicine

## 2018-02-03 ENCOUNTER — Encounter: Payer: Self-pay | Admitting: Sports Medicine

## 2018-02-03 VITALS — BP 106/66 | Ht 62.0 in | Wt 260.0 lb

## 2018-02-03 DIAGNOSIS — M25561 Pain in right knee: Secondary | ICD-10-CM | POA: Diagnosis not present

## 2018-02-03 NOTE — Progress Notes (Signed)
  Andrea Perry - 67 y.o. female MRN [id number]  Date of birth: 1950/07/17    SUBJECTIVE:      Chief Complaint:/ HPI:  67 year old female here for follow-up for right knee pain.  Patient had a fall on 01/16/2018.  She was seen in the clinic on 01/17/2018, x-rays performed, no fracture identified at that time.  Today she reports 0/10 pain most of the time.  She does note some tenderness with palpation over the anterior knee/patella, but otherwise no pain with weightbearing or ambulation.  She denies any numbness or tingling in the distal extremity.  She notes some continued bruising over the lateral knee/leg and mild swelling.  She does have pitting edema at baseline.  She also reports some erythema over the anterior lower right leg.  She denies any fevers or chills.   ROS:     See HPI  PERTINENT  PMH / PSH FH / / SH:  Past Medical, Surgical, Social, and Family History Reviewed & Updated in the EMR.   OBJECTIVE: BP 106/66   Ht 5\' 2"  (1.575 m)   Wt 260 lb (117.9 kg)   BMI 47.55 kg/m   Physical Exam:  Vital signs are reviewed.  GEN: Alert and oriented, NAD Pulm: Breathing unlabored PSY: normal mood, congruent affect  MSK: Right knee: - Inspection: No obvious effusion.  There is some soft tissue swelling over the anterior knee around the inferior pole of the patella.  There is also some mild soft tissue swelling over the lateral lower leg.  Bruising over the anterior knee and lateral leg noted.  There is mild diffuse erythema over the anterior lower leg.  No increased warmth - Palpation: Tenderness with light palpation over the tibia as well as just inferiorly over the area of bruising.  Pitting edema present bilaterally at baseline per patient.  No tenderness with calf squeeze - ROM: full active ROM with flexion and extension - Strength: 5/5 strength - Neuro/vasc: NV intact - Special Tests: - LIGAMENTS: negative anterior and posterior drawer no MCL or LCL laxity  - MENISCUS: negative  McMurray's   Hips: No pain with passive IR/ER   ASSESSMENT & PLAN:  1.  Right knee pain- patient is doing much better since the initial injury.  X-rays at that time rule out fracture.  Today she has no structural instability of the knee and no concern for meniscal pathology.  She continues to have some soft tissue swelling and bruising consistent with contusion. - Ice 15 minutes 3-4 times daily -Continue Tylenol or NSAIDs as needed for pain -Regarding the lower extremity erythema, this appears more consistent with local irritation from increased swelling.  It does not appear consistent with cellulitis.  She will continue to monitor this and follow-up either here or with her PCP if the erythema worsens however expect this to resolve as her swelling and bruising resolved. -Follow-up as needed

## 2018-02-05 DIAGNOSIS — J449 Chronic obstructive pulmonary disease, unspecified: Secondary | ICD-10-CM | POA: Diagnosis not present

## 2018-02-05 IMAGING — CR DG LUMBAR SPINE 2-3V
3 series · 3 of 3 positions shown · non-contrast
Comparison: MRI December 12, 2009

CLINICAL DATA: Chronic low back pain.

EXAM:
LUMBAR SPINE - 2-3 VIEW

[w lumbar spine ap]
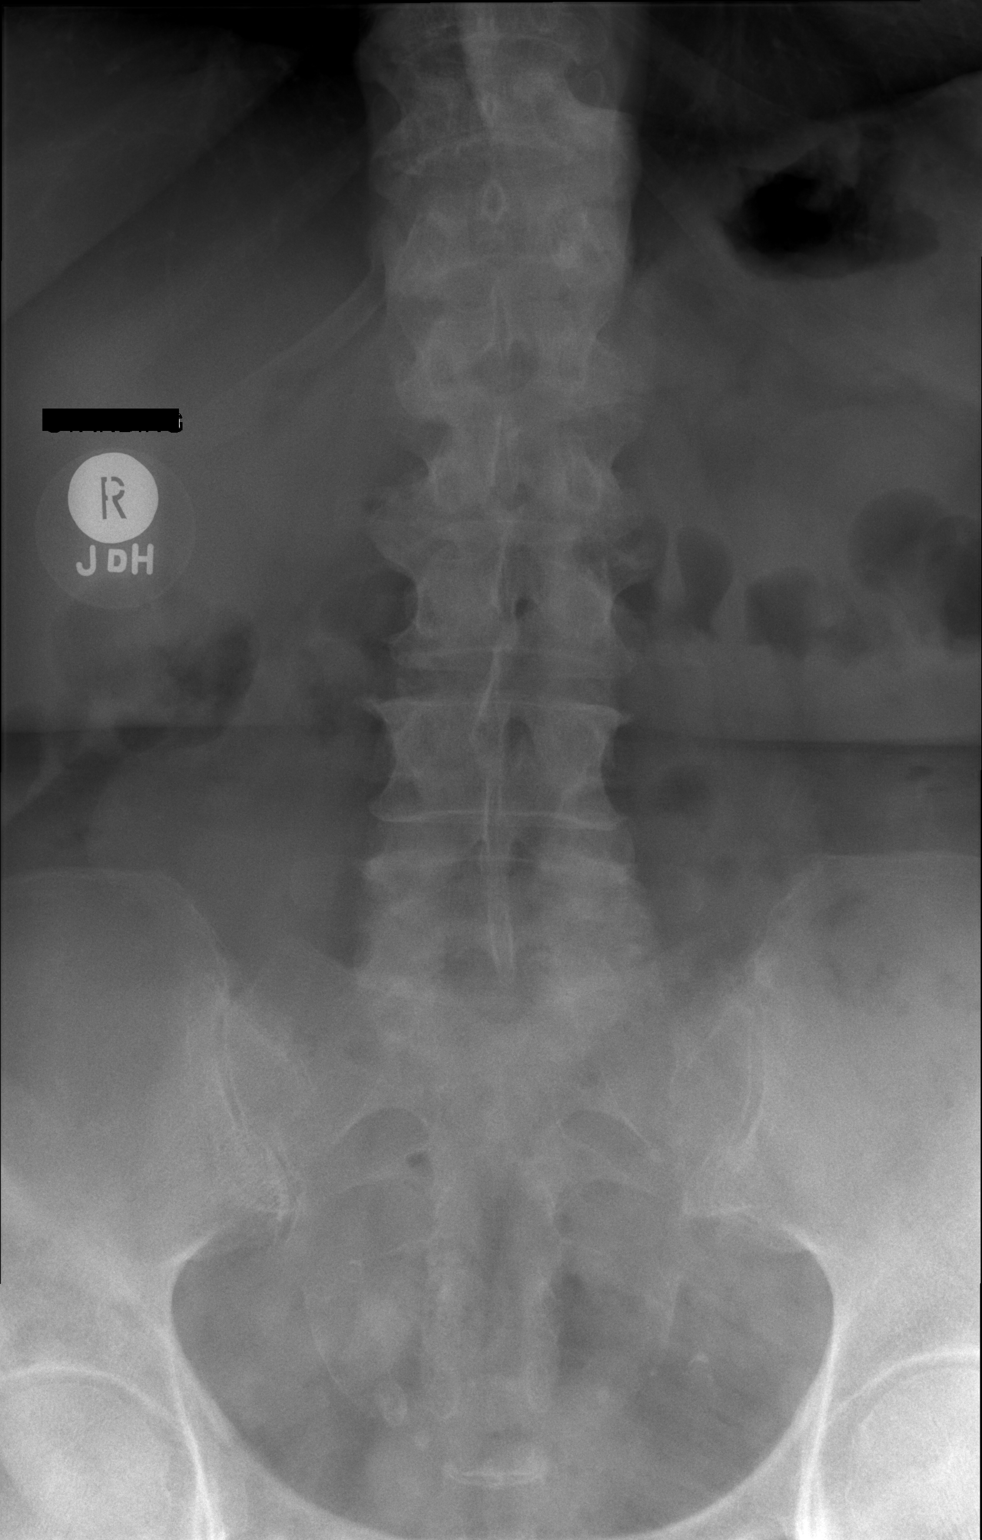

[w lumbar spine lat]
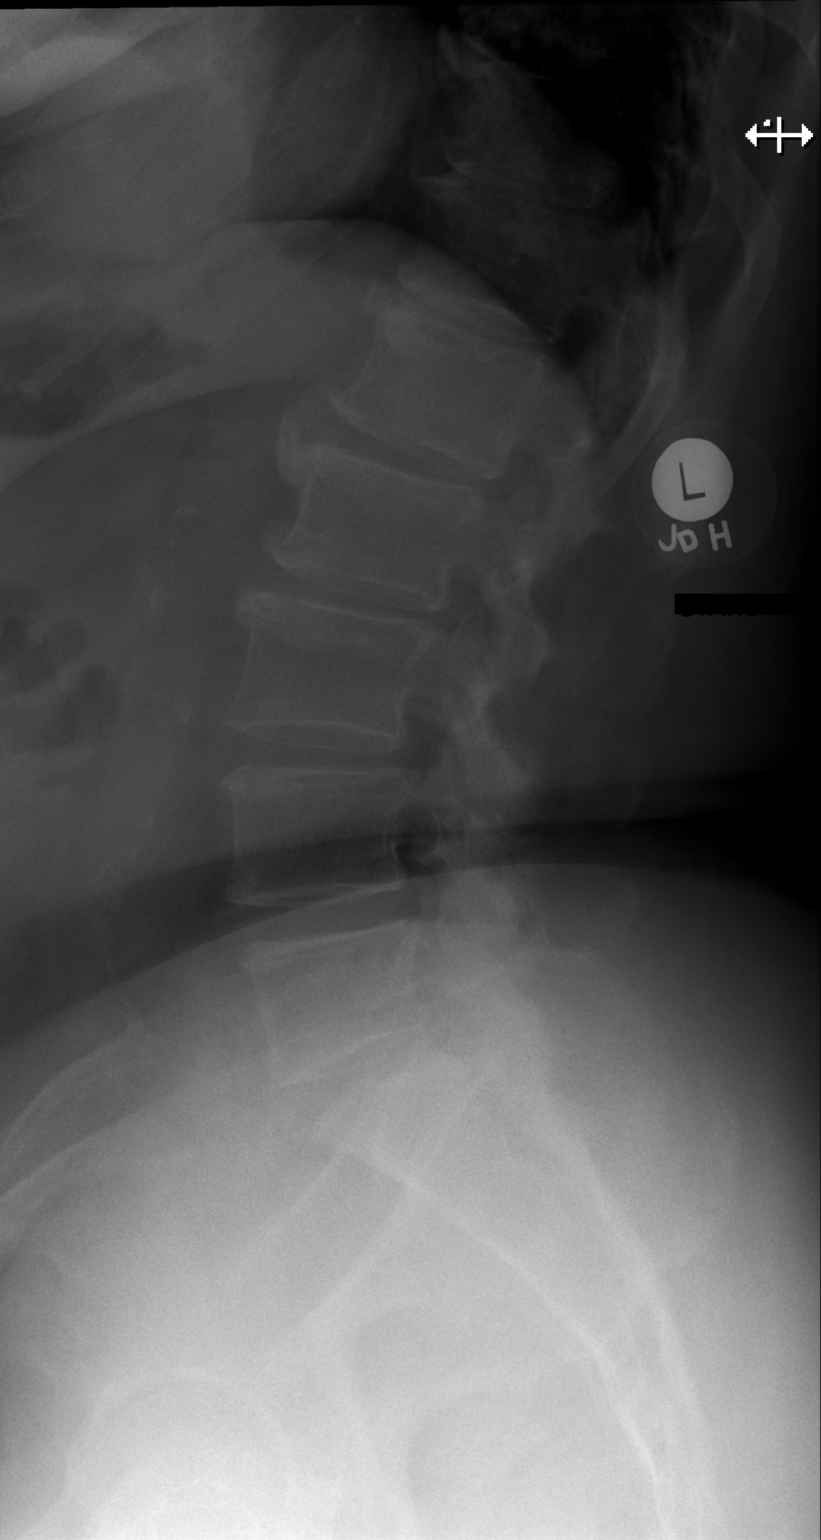

[w lumbar l-5 s-1 spot]
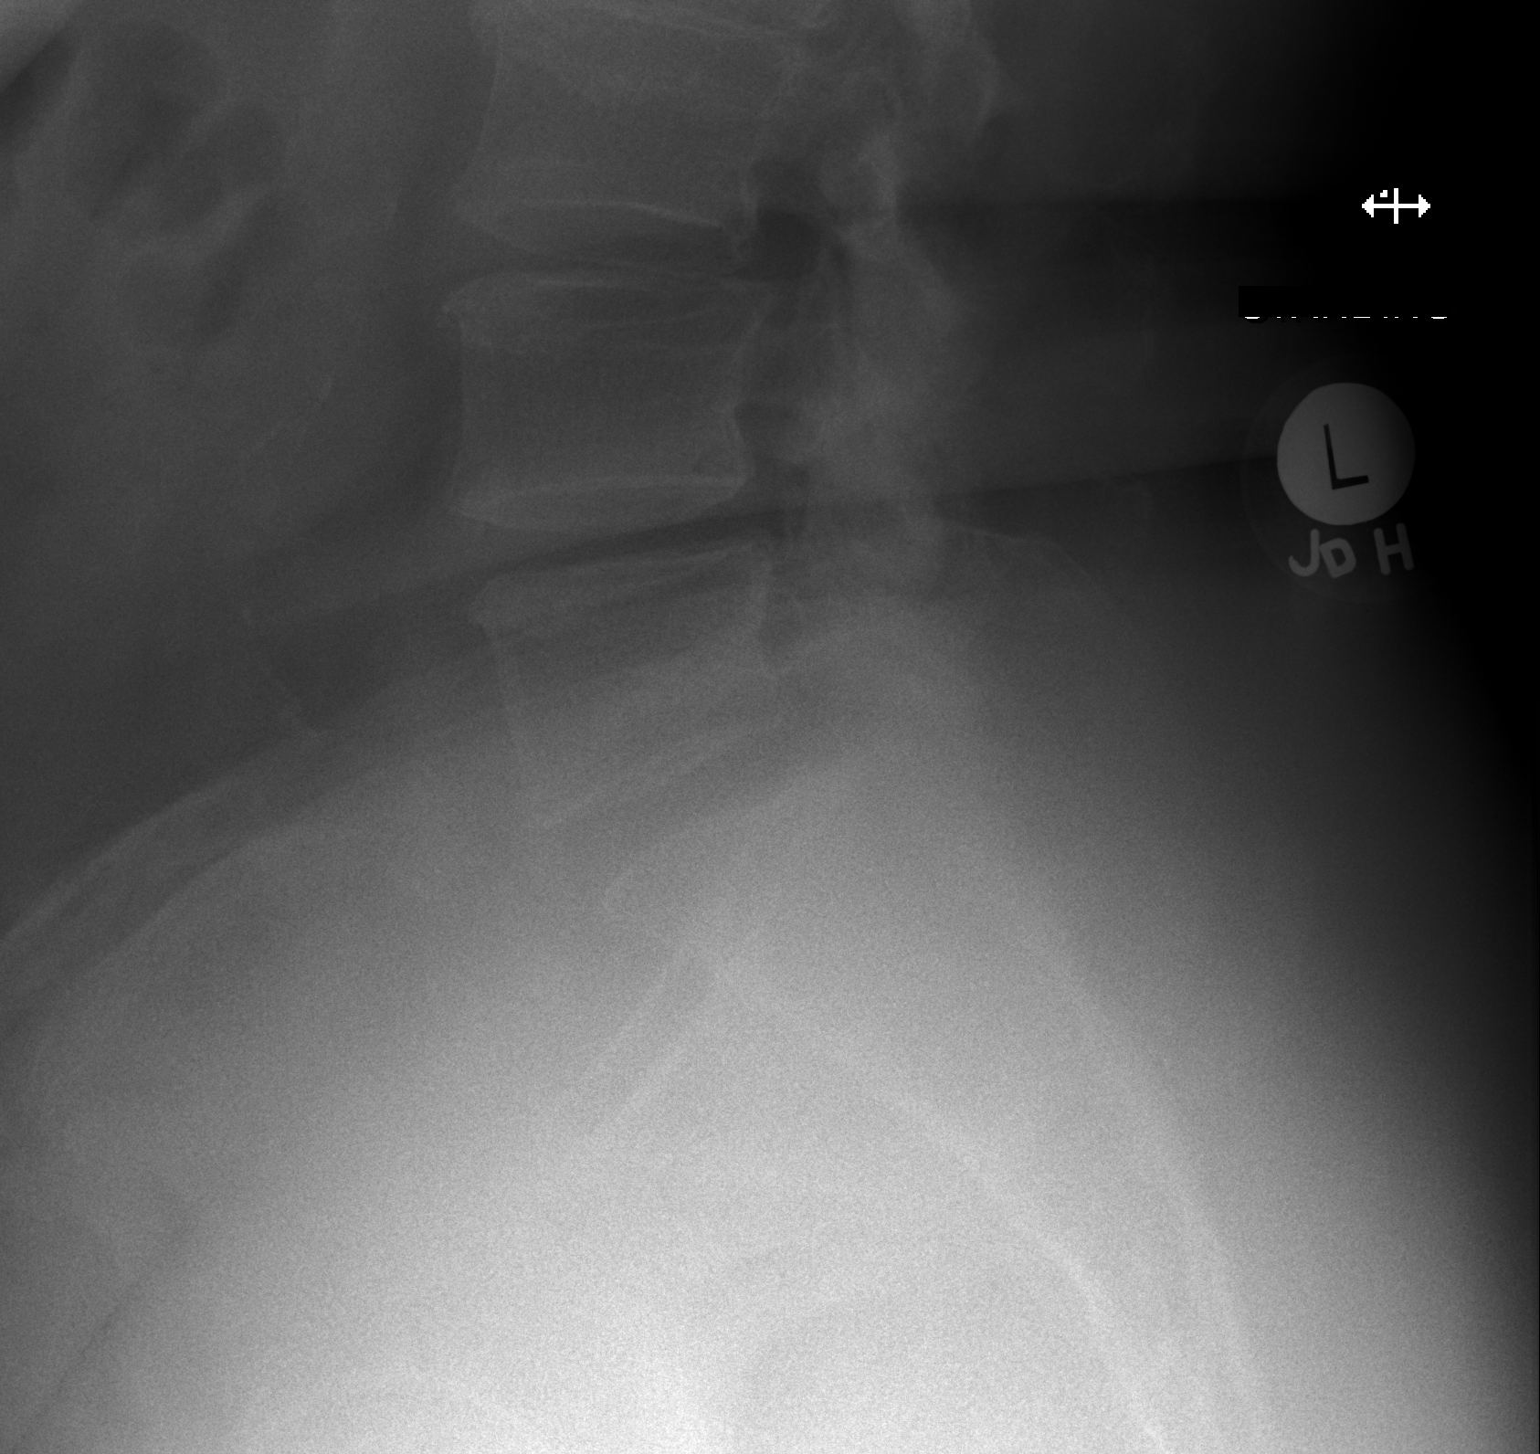

[3 of 3 positions shown; findings below may reference images not displayed]

FINDINGS: No fracture or malalignment. Multilevel degenerative changes, most
marked in the lower thoracic spine and upper lumbar spine. Lower
lumbar facet degenerative changes.
IMPRESSION: Degenerative changes.  No acute abnormalities.

## 2018-02-09 ENCOUNTER — Other Ambulatory Visit: Payer: Self-pay | Admitting: Cardiovascular Disease

## 2018-02-09 DIAGNOSIS — I6523 Occlusion and stenosis of bilateral carotid arteries: Secondary | ICD-10-CM

## 2018-02-11 ENCOUNTER — Other Ambulatory Visit: Payer: Self-pay | Admitting: Emergency Medicine

## 2018-02-11 ENCOUNTER — Telehealth: Payer: Self-pay | Admitting: Emergency Medicine

## 2018-02-11 DIAGNOSIS — J438 Other emphysema: Secondary | ICD-10-CM

## 2018-02-11 NOTE — Telephone Encounter (Signed)
Spoke with the pt She states needing order for CPAP supplies  Order sent to [facility]  Nothing further needed

## 2018-02-21 ENCOUNTER — Other Ambulatory Visit: Payer: Self-pay | Admitting: Internal Medicine

## 2018-02-21 ENCOUNTER — Telehealth: Payer: Self-pay | Admitting: Emergency Medicine

## 2018-02-21 MED ORDER — ALBUTEROL SULFATE HFA 108 (90 BASE) MCG/ACT IN AERS: INHALATION_SPRAY | RESPIRATORY_TRACT | 11 refills | Status: DC

## 2018-02-21 NOTE — Telephone Encounter (Signed)
Called patient unable to reach, left detailed message. Refill sent in. Nothing further needed.

## 2018-02-21 NOTE — Telephone Encounter (Addendum)
Phone call to pt. To ask which pharmacy she prefers her medications to go to; stated she wants the refills to go to [place] at [facility].   Also questioned about Alprazolam, as it is not on her active medication sheet.  Reported she is requesting refill on "Lorazepam" and Furosemide.  Advised will review and refill as appropriate.  Pt. Agrees with plan.

## 2018-02-21 NOTE — Telephone Encounter (Signed)
Copied from [place] [id number]. Topic: Quick Communication - Rx Refill/Question >> Feb 21, 2018  4:08 PM Gardiner Ramus wrote: Medication: furosemide (LASIX) 40 MG tablet [[id number]] ALPRAZolam (XANAX) 1 MG tablet [[id number]]   Has the patient contacted their pharmacy? No  Preferred Pharmacy (with phone number or street name):   Agent: Please be advised that RX refills may take up to 3 business days. We ask that you follow-up with your pharmacy.

## 2018-02-22 MED ORDER — FUROSEMIDE 40 MG PO TABS: 40.0000 mg | ORAL_TABLET | Freq: Every day | ORAL | 0 refills | Status: DC

## 2018-02-22 MED ORDER — LORAZEPAM 1 MG PO TABS: ORAL_TABLET | ORAL | 1 refills | Status: DC

## 2018-02-22 NOTE — Telephone Encounter (Signed)
Done erx 

## 2018-02-22 NOTE — Telephone Encounter (Signed)
Requested medication (s) are due for refill today: yes  Requested medication (s) are on the active medication list: yes    Last refill: 06/23/17  #90 1 refill  Future visit scheduled   yes 03/23/18  Dr. Jenny Reichmann  Notes to clinic:not delegated  Requested Prescriptions  Pending Prescriptions Disp Refills   LORazepam (ATIVAN) 1 MG tablet 90 tablet 1    Sig: Take 1 tablet by mouth at bedtime as needed for anxiety.     Not Delegated - Psychiatry:  Anxiolytics/Hypnotics Failed - 02/21/2018  4:36 PM      Failed - This refill cannot be delegated      Failed - Urine Drug Screen completed in last 360 days.      Passed - Valid encounter within last 6 months    Recent Outpatient Visits          5 months ago Type 2 diabetes mellitus without complication, without long-term current use of insulin ([place])   [place] [facility], James W, MD   11 months ago Encounter for preventative adult health care exam with abnormal findings   [place] John, James W, MD   1 year ago Hematoma   [facility] -Georges Mouse, MD   1 year ago Pre-operative examination for internal medicine   [facility] -Georges Mouse, MD   1 year ago Type 2 diabetes mellitus without complication, without long-term current use of insulin [facility])   [facility] -Georges Mouse, MD      Future Appointments            In 1 week Byrum, Rose Fillers, MD [facility]   In 4 weeks Biagio Borg, MD [facility], [facility]         Signed Prescriptions Disp Refills   furosemide (LASIX) 40 MG tablet 90 tablet 0    Sig: Take 1 tablet (40 mg total) by mouth daily.     Cardiovascular:  Diuretics - Loop Passed - 02/21/2018  4:36 PM      Passed - K in normal range and within 360 days    Potassium  Date Value Ref Range Status  09/15/2017 4.6 3.5 - 5.1 mEq/L Final         Passed - Ca in normal  range and within 360 days    Calcium  Date Value Ref Range Status  09/15/2017 8.8 8.4 - 10.5 mg/dL Final         Passed - Na in normal range and within 360 days    Sodium  Date Value Ref Range Status  09/15/2017 143 135 - 145 mEq/L Final  09/08/2016 143 134 - 144 mmol/L Final         Passed - Cr in normal range and within 360 days    Creatinine, Ser  Date Value Ref Range Status  09/15/2017 0.66 0.40 - 1.20 mg/dL Final         Passed - Last BP in normal range    BP Readings from Last 1 Encounters:  02/03/18 106/66         Passed - Valid encounter within last 6 months    Recent Outpatient Visits          5 months ago Type 2 diabetes mellitus without complication, without long-term current use of insulin [facility])   [facility] -Georges Mouse, MD  11 months ago Encounter for preventative adult health care exam with abnormal findings   [place] John, James W, MD   1 year ago Hematoma   [facility] -Georges Mouse, MD   1 year ago Pre-operative examination for internal medicine   [facility] -Georges Mouse, MD   1 year ago Type 2 diabetes mellitus without complication, without long-term current use of insulin [facility])   [facility] -Georges Mouse, MD      Future Appointments            In 1 week Byrum, Rose Fillers, MD [facility]   In 4 weeks Jenny Reichmann, Hunt Oris, MD [place], [facility]

## 2018-02-23 NOTE — Telephone Encounter (Signed)
MD approved and sent electronically to pof../lmb  

## 2018-02-28 ENCOUNTER — Telehealth: Payer: Self-pay | Admitting: Internal Medicine

## 2018-02-28 DIAGNOSIS — J449 Chronic obstructive pulmonary disease, unspecified: Secondary | ICD-10-CM | POA: Diagnosis not present

## 2018-02-28 NOTE — Telephone Encounter (Signed)
Noted  

## 2018-02-28 NOTE — Telephone Encounter (Signed)
Copied from [place] [id number]. Topic: Quick Communication - See Telephone Encounter >> Feb 28, 2018  1:00 PM Blase Mess A wrote: CRM for notification. See Telephone encounter for: 02/28/18.  Nicki Reaper is calling [employer] [phone] regarding PA for LORazepam (ATIVAN) 1 MG tablet [[id number]] has been approved for 1 year

## 2018-03-03 ENCOUNTER — Other Ambulatory Visit: Payer: Self-pay | Admitting: *Deleted

## 2018-03-03 ENCOUNTER — Encounter: Payer: Self-pay | Admitting: Sports Medicine

## 2018-03-03 ENCOUNTER — Ambulatory Visit (INDEPENDENT_AMBULATORY_CARE_PROVIDER_SITE_OTHER): Payer: Medicare Other | Admitting: Sports Medicine

## 2018-03-03 ENCOUNTER — Ambulatory Visit
Admission: RE | Admit: 2018-03-03 | Discharge: 2018-03-03 | Disposition: A | Discharge status: Home / Self Care | Payer: Medicare Other | Source: Ambulatory Visit | Attending: Sports Medicine | Admitting: Sports Medicine

## 2018-03-03 VITALS — BP 105/66 | Ht 62.0 in | Wt 260.0 lb

## 2018-03-03 DIAGNOSIS — M25512 Pain in left shoulder: Secondary | ICD-10-CM

## 2018-03-03 DIAGNOSIS — M19012 Primary osteoarthritis, left shoulder: Secondary | ICD-10-CM | POA: Diagnosis not present

## 2018-03-03 MED ORDER — METHYLPREDNISOLONE ACETATE 40 MG/ML IJ SUSP
40.0000 mg | Freq: Once | INTRAMUSCULAR | Status: AC
  Administered 2018-03-03: 40 mg via INTRA_ARTICULAR

## 2018-03-04 ENCOUNTER — Encounter: Payer: Self-pay | Admitting: Sports Medicine

## 2018-03-04 NOTE — Progress Notes (Signed)
   Subjective:    Patient ID: Andrea Perry, female    DOB: February 04, 1951, 67 y.o.   MRN: [id number]  HPI chief complaint: Left shoulder pain  Andrea Perry comes in today complaining of worsening left shoulder pain.  She has had problems with this same shoulder in the past.  She was last seen in the office in July and received a subacromial cortisone injection.  That was helpful but not completely curative.  Symptoms were tolerable up until 2 days ago.  Pain became acutely worse at that time without any known injury.  It is diffuse throughout the shoulder.  No numbness or tingling.  It is identical in nature to the pain she has experienced previously    Review of Systems    As above Objective:   Physical Exam  Well-developed, well-nourished.  No acute distress.  Awake alert and oriented x3.  Vital signs reviewed.  Left shoulder: Good active and passive range of motion with a positive painful arc.  No tenderness to palpation over the bicipital groove.  Positive empty can, positive Hawkins.  Rotator cuff strength remains 5/5 but she does have reproducible pain with resisted supraspinatus and external rotation.  Equivocal O'Brien's.  Negative Spurling's.  Neurovascularly intact distally.  X-rays of the left shoulder including AP and axillary show no significant glenohumeral degenerative changes.  Nothing acute.      Assessment & Plan:   Chronic left shoulder pain secondary to rotator cuff tendinopathy versus rotator cuff tear  Patient's left subacromial space was injected with cortisone today.  It was accomplished atraumatically under sterile technique after risks and benefits were explained.  Patient tolerated this without difficulty.  Patient has had symptoms for several months.  I did discuss the possibility of surgical referral if symptoms warrant.  If that is the case, we will need to get a preoperative MRI of the left shoulder.  Patient understands.  Follow-up for ongoing or recalcitrant  issues.  Consent obtained and verified. Time-out conducted. Noted no overlying erythema, induration, or other signs of local infection. Skin prepped in a sterile fashion. Topical analgesic spray: Ethyl chloride. Joint: left shoulder, subacromial Needle: 25g 1.5 inch Completed without difficulty. Meds: 3cc 1% xylocaine, 1cc (40mg ) depomedrol  Advised to call if fevers/chills, erythema, induration, drainage, or persistent bleeding.

## 2018-03-07 ENCOUNTER — Ambulatory Visit: Payer: Medicare Other | Admitting: Emergency Medicine

## 2018-03-15 ENCOUNTER — Other Ambulatory Visit: Payer: Self-pay | Admitting: Cardiovascular Disease

## 2018-03-15 NOTE — Telephone Encounter (Signed)
Pt is a 67 yr old female of the Dr. Kyla Balzarine who saw the PA on 10/13/17. Last SCr on 09/15/17 was 0.66, Weight on 03/03/18 was 117.9Kg.

## 2018-03-23 ENCOUNTER — Inpatient Hospital Stay (HOSPITAL_COMMUNITY): Admission: RE | Admit: 2018-03-23 | Payer: Medicare Other | Source: Ambulatory Visit

## 2018-03-23 ENCOUNTER — Encounter: Payer: Self-pay | Admitting: Internal Medicine

## 2018-03-23 ENCOUNTER — Other Ambulatory Visit (INDEPENDENT_AMBULATORY_CARE_PROVIDER_SITE_OTHER): Payer: Medicare Other

## 2018-03-23 ENCOUNTER — Ambulatory Visit: Payer: Medicare Other | Admitting: Internal Medicine

## 2018-03-23 VITALS — BP 120/64 | HR 137 | Temp 98.2°F | Ht 62.0 in | Wt 247.0 lb

## 2018-03-23 DIAGNOSIS — Z0001 Encounter for general adult medical examination with abnormal findings: Secondary | ICD-10-CM | POA: Diagnosis not present

## 2018-03-23 DIAGNOSIS — J438 Other emphysema: Secondary | ICD-10-CM | POA: Diagnosis not present

## 2018-03-23 DIAGNOSIS — K529 Noninfective gastroenteritis and colitis, unspecified: Secondary | ICD-10-CM | POA: Diagnosis not present

## 2018-03-23 DIAGNOSIS — R197 Diarrhea, unspecified: Secondary | ICD-10-CM

## 2018-03-23 DIAGNOSIS — Z23 Encounter for immunization: Secondary | ICD-10-CM

## 2018-03-23 DIAGNOSIS — E119 Type 2 diabetes mellitus without complications: Secondary | ICD-10-CM | POA: Diagnosis not present

## 2018-03-23 DIAGNOSIS — J449 Chronic obstructive pulmonary disease, unspecified: Secondary | ICD-10-CM | POA: Diagnosis not present

## 2018-03-23 LAB — CBC WITH DIFFERENTIAL/PLATELET
Basophils Absolute: 0.1 10*3/uL (ref 0.0–0.1)
Basophils Relative: 0.9 % (ref 0.0–3.0)
EOS PCT: 1.8 % (ref 0.0–5.0)
Eosinophils Absolute: 0.2 10*3/uL (ref 0.0–0.7)
HCT: 45.4 % (ref 36.0–46.0)
HEMOGLOBIN: 14.8 g/dL (ref 12.0–15.0)
Lymphocytes Relative: 31.6 % (ref 12.0–46.0)
Lymphs Abs: 3.6 10*3/uL (ref 0.7–4.0)
MCHC: 32.6 g/dL (ref 30.0–36.0)
MCV: 93.4 fl (ref 78.0–100.0)
MONOS PCT: 8.7 % (ref 3.0–12.0)
Monocytes Absolute: 1 10*3/uL (ref 0.1–1.0)
Neutro Abs: 6.5 10*3/uL (ref 1.4–7.7)
Neutrophils Relative %: 57 % (ref 43.0–77.0)
Platelets: 277 10*3/uL (ref 150.0–400.0)
RBC: 4.86 Mil/uL (ref 3.87–5.11)
RDW: 14.5 % (ref 11.5–15.5)
WBC: 11.3 10*3/uL — ABNORMAL HIGH (ref 4.0–10.5)

## 2018-03-23 LAB — HEMOGLOBIN A1C: Hgb A1c MFr Bld: 6.6 % — ABNORMAL HIGH (ref 4.6–6.5)

## 2018-03-23 LAB — LIPID PANEL
Cholesterol: 163 mg/dL (ref 0–200)
HDL: 41.9 mg/dL (ref >39.00)
LDL Cholesterol: 87 mg/dL (ref 0–99)
NONHDL: 120.98
Total CHOL/HDL Ratio: 4
Triglycerides: 168 mg/dL — ABNORMAL HIGH (ref 0.0–149.0)
VLDL: 33.6 mg/dL (ref 0.0–40.0)

## 2018-03-23 LAB — HEPATIC FUNCTION PANEL
ALT: 15 U/L (ref 0–35)
AST: 14 U/L (ref 0–37)
Albumin: 4.2 g/dL (ref 3.5–5.2)
Alkaline Phosphatase: 65 U/L (ref 39–117)
Bilirubin, Direct: 0.2 mg/dL (ref 0.0–0.3)
Total Bilirubin: 1.1 mg/dL (ref 0.2–1.2)
Total Protein: 7.2 g/dL (ref 6.0–8.3)

## 2018-03-23 LAB — TSH: TSH: 7.12 u[IU]/mL — ABNORMAL HIGH (ref 0.35–4.50)

## 2018-03-23 LAB — BASIC METABOLIC PANEL
BUN: 16 mg/dL (ref 6–23)
CALCIUM: 9 mg/dL (ref 8.4–10.5)
CO2: 24 mEq/L (ref 19–32)
Chloride: 106 mEq/L (ref 96–112)
Creatinine, Ser: 0.9 mg/dL (ref 0.40–1.20)
GFR: 66.74 mL/min (ref >60.00)
GLUCOSE: 143 mg/dL — AB (ref 70–99)
Potassium: 4 mEq/L (ref 3.5–5.1)
Sodium: 140 mEq/L (ref 135–145)

## 2018-03-23 MED ORDER — ONDANSETRON HCL 4 MG PO TABS: 4.0000 mg | ORAL_TABLET | Freq: Three times a day (TID) | ORAL | 1 refills | Status: DC | PRN

## 2018-03-23 MED ORDER — DIPHENOXYLATE-ATROPINE 2.5-0.025 MG PO TABS: 1.0000 | ORAL_TABLET | Freq: Four times a day (QID) | ORAL | 0 refills | Status: DC | PRN

## 2018-03-23 NOTE — Assessment & Plan Note (Addendum)
Mild to mod symptoms, likely viral, for labs including stool studies, for zofran prn and lomotil prn,  to f/u any worsening symptoms or concerns  In addition to the time spent performing CPE, I spent an additional 25 minutes face to face,in which greater than 50% of this time was spent in counseling and coordination of care for patient's acute illness as documented, including the differential dx, treatment, further evaluation and other management of acute gastroenteritis, COPD, DM

## 2018-03-23 NOTE — Assessment & Plan Note (Signed)
stable overall by history and exam, recent data reviewed with pt, and pt to continue medical treatment as before,  to f/u any worsening symptoms or concerns  

## 2018-03-23 NOTE — Assessment & Plan Note (Signed)

## 2018-03-23 NOTE — Patient Instructions (Addendum)
Please take all new medication as prescribed - the diarrhea and nausea medications as needed  Please continue all other medications as before, and refills have been done if requested.  Please have the pharmacy call with any other refills you may need.  Please continue your efforts at being more active, low cholesterol diet, and weight control.  You are otherwise up to date with prevention measures today.  Please keep your appointments with your specialists as you may have planned  Please go to the LAB in the Basement (turn left off the elevator) for the tests to be done today  You will be contacted by phone if any changes need to be made immediately.  Otherwise, you will receive a letter about your results with an explanation, but please check with MyChart first.  Please remember to sign up for MyChart if you have not done so, as this will be important to you in the future with finding out test results, communicating by private email, and scheduling acute appointments online when needed.  Please return in 6 months, or sooner if needed, with Lab testing done 3-5 days before

## 2018-03-23 NOTE — Progress Notes (Signed)
Subjective:    Patient ID: Andrea Perry, female    DOB: 1950-10-26, 67 y.o.   MRN: [id number]  HPI  Here for wellness and f/u;  Overall doing ok;  Pt denies Chest pain, worsening SOB, DOE, wheezing, orthopnea, PND, worsening LE edema, palpitations, dizziness or syncope.  Pt denies neurological change such as new headache, facial or extremity weakness.  Pt denies polydipsia, polyuria, or low sugar symptoms. Pt states overall good compliance with treatment and medications, good tolerability, and has been trying to follow appropriate diet.  Pt denies worsening depressive symptoms, suicidal ideation or panic. No fever, night sweats, wt loss, loss of appetite, or other constitutional symptoms.  Pt states good ability with ADL's, has low fall risk, home safety reviewed and adequate, no other significant changes in hearing or vision, and only occasionally active with exercise. Also incidentally - has had marked N/V/D since Sunday 3 days with some "rough" throat after vomiting, as well as "roiling" mild intermittent abd pains with slight distension, but no blood   No fever,but does have an unusual belchiing with egg taste.  Did have a [place] meal after left sitting out the whole day at thanksgiving, no one else sick Past Medical History:  Diagnosis Date  . ALLERGIC RHINITIS 08/16/2008  . ANXIETY 08/16/2008  . Arthritis    hands, knees, lower back  . ASTHMA 08/16/2008  . Asthma    BRONCHITIS     DR. Lamonte Sakai    . Bladder leak   . Cancer ([place])    MELANOMA      . Chronic lower back pain    problems with disc L2-5  . COPD 08/16/2008  . DEPRESSION 08/16/2008  . Dysrhythmia    hx AF  . Excessive daytime sleepiness 06/19/2015  . GENITAL HERPES 12/03/2006  . GERD (gastroesophageal reflux disease)   . H/O hiatal hernia   . HEMATOCHEZIA 06/20/2009  . Hemorrhoids   . History of cardioversion 10/30/14  . History of kidney stones   . HYPERLIPIDEMIA 08/16/2008  . HYPERTENSION 12/03/2006  . Hypertension   . Impaired  glucose tolerance 09/21/2010  . Overactive bladder 10/20/2015  . Pneumonia    as a child  . Pulmonary HTN ([place]) 06/19/2015  . Shortness of breath dyspnea   . Sleep apnea    MILD NO CPAP ORDERED  . Snoring 06/19/2015   Past Surgical History:  Procedure Laterality Date  . ABDOMINAL HYSTERECTOMY    . APPENDECTOMY    . CARDIOVERSION N/A 03/20/2014   Procedure: CARDIOVERSION;  Surgeon: Josue Hector, MD;  Location[facility];  Service: Cardiovascular;  Laterality: N/A;  . CARDIOVERSION N/A 10/30/2014   Procedure: CARDIOVERSION;  Surgeon: Josue Hector, MD;  Location: [facility] END[facility]COPY;  Service: Cardiovascular;  Laterality: N/A;  . CARPAL TUNNEL RELEASE     Right + LEFT  . CESAREAN SECTION     x 1   . COLONOSCOPY  2004  . CYST EXCISION     RT ARM   . CYSTOCELE REPAIR N/A 10/25/2012   Procedure: ANTERIOR REPAIR (CYSTOCELE);  Surgeon: Gus Height, MD;  Location: [facility];  Service: Gynecology;  Laterality: N/A;  . HEEL SPUR SURGERY Bilateral   . KNEE SURGERY     Right + LEFT  . LUMBAR LAMINECTOMY/DECOMPRESSION MICRODISCECTOMY N/A 03/19/2016   Procedure: Laminectomy and Foraminotomy - Lumbar three-four - Lumbar four-five;  Surgeon: Eustace Moore, MD;  Location: [facility];  Service: Neurosurgery;  Laterality: N/A;  . RADIOLOGY WITH ANESTHESIA Right 11/01/2014  Procedure: MRI RIGHT FOREARM;  Surgeon: Medication Radiologist, MD;  Location: [place];  Service: Radiology;  Laterality: Right;  . RADIOLOGY WITH ANESTHESIA Right 08/29/2015   Procedure: MRI - RIGHT FOREARM;  Surgeon: Medication Radiologist, MD;  Location: [place];  Service: Radiology;  Laterality: Right;  . RADIOLOGY WITH ANESTHESIA N/A 12/05/2015   Procedure: MRI SPINE WITHOUT;  Surgeon: Medication Radiologist, MD;  Location: [place];  Service: Radiology;  Laterality: N/A;  . SKIN CANCER EXCISION     BILAT SHOULDERS  . SPLIT NIGHT STUDY  09/04/2015  . TONSILLECTOMY AND ADENOIDECTOMY      reports that she quit smoking about 14 years ago. Her smoking  use included cigarettes. She has a 45.00 pack-year smoking history. She has never used smokeless tobacco. She reports that she drinks alcohol. She reports that she does not use drugs. family history includes Colon polyps in her mother; Diabetes in her mother; Hyperlipidemia in her mother; Hypertension in her mother; Stroke in her father. Allergies  Allergen Reactions  . Tramadol Hcl Nausea And Vomiting and Other (See Comments)     mouth dryness, headache  . Codeine Nausea And Vomiting  . Tape Rash    Plastic tape, bandaids and ekg leads, causes redness and rash  . Vicodin [Hydrocodone-Acetaminophen] Nausea And Vomiting   Current Outpatient Medications on File Prior to Visit  Medication Sig Dispense Refill  . albuterol (PROVENTIL HFA;VENTOLIN HFA) 108 (90 Base) MCG/ACT inhaler Inhale 2 puffs by mouth every 6 hours as needed for wheezing or shortness of breath. **Yearly physical due in June** 1 Inhaler 11  . amLODipine (NORVASC) 10 MG tablet Take 1 tablet (10 mg total) by mouth daily. 90 tablet 3  . cetirizine (ZYRTEC) 10 MG tablet Take 10 mg by mouth daily.     . Cholecalciferol (VITAMIN D) 2000 units tablet Take 2,000 Units by mouth daily.    . Cyanocobalamin (VITAMELTS ENERGY VITAMIN B-12) 1500 MCG TBDP Take 1,500 mcg by mouth daily.    Marland Kitchen dimenhyDRINATE (DRAMAMINE) 50 MG tablet Take 50 mg by mouth daily as needed for nausea.    Marland Kitchen ELIQUIS 5 MG TABS tablet TAKE 1 TABLET BY MOUTH TWICE DAILY 180 tablet 1  . escitalopram (LEXAPRO) 20 MG tablet Take 1 tablet (20 mg total) by mouth daily. Follow-up appt due in Nov must see provider for future refills 90 tablet 0  . estradiol (ESTRACE) 2 MG tablet Take 1 tablet (2 mg total) by mouth daily. 90 tablet 3  . flecainide (TAMBOCOR) 50 MG tablet Take 1 tablet (50 mg total) by mouth 2 (two) times daily. 180 tablet 3  . fluticasone (FLONASE) 50 MCG/ACT nasal spray Place 2 sprays into both nostrils daily. 16 g 2  . furosemide (LASIX) 40 MG tablet Take 1  tablet (40 mg total) by mouth daily. 90 tablet 0  . Garlic 6045 MG CAPS Take 1,000 mg by mouth at bedtime.     Marland Kitchen guaiFENesin (MUCINEX) 600 MG 12 hr tablet Take 1 tablet (600 mg total) by mouth 2 (two) times daily as needed for cough or to loosen phlegm. 60 tablet 2  . ipratropium-albuterol (DUONEB) 0.5-2.5 (3) MG/3ML SOLN Take 3 mLs by nebulization 4 (four) times daily. 120 mL 5  . irbesartan (AVAPRO) 150 MG tablet TAKE 1 TABLET BY MOUTH ONCE DAILY 30 tablet 11  . LORazepam (ATIVAN) 1 MG tablet Take 1 tablet by mouth at bedtime as needed for anxiety. 90 tablet 1  . meloxicam (MOBIC) 15 MG tablet Take  one pill a day with food for 7 days and then prn thereafter 30 tablet 0  . metoprolol tartrate (LOPRESSOR) 50 MG tablet TAKE 1 & 1/2 (ONE & ONE-HALF) TABLETS BY MOUTH TWICE DAILY 270 tablet 2  . nystatin (MYCOSTATIN/NYSTOP) powder Use as directed twice daily as needed 30 g 1  . Omega-3 Fatty Acids (FISH OIL) 1200 MG CAPS Take 1,200 mg by mouth daily.     . pantoprazole (PROTONIX) 40 MG tablet Take 1 tablet (40 mg total) by mouth daily. 90 tablet 1  . potassium chloride SA (K-DUR,KLOR-CON) 20 MEQ tablet Take 1 tablet (20 mEq total) by mouth daily. 90 tablet 3  . pravastatin (PRAVACHOL) 40 MG tablet TAKE 1 TABLET BY MOUTH ONCE DAILY 90 tablet 1  . predniSONE (DELTASONE) 20 MG tablet Take 1 tablet (20 mg total) by mouth daily with breakfast. 5 tablet 0  . Tetrahydrozoline HCl (VISINE OP) Place 1 drop into both eyes daily as needed (dry eyes).    . vitamin E 400 UNIT capsule Take 400 Units by mouth daily.    Marland Kitchen zolpidem (AMBIEN) 5 MG tablet Take 1 tablet (5 mg total) by mouth at bedtime as needed for sleep. 90 tablet 1   No current facility-administered medications on file prior to visit.    Review of Systems Constitutional: Negative for other unusual diaphoresis, sweats, appetite or weight changes HENT: Negative for other worsening hearing loss, ear pain, facial swelling, mouth sores or neck stiffness.    Eyes: Negative for other worsening pain, redness or other visual disturbance.  Respiratory: Negative for other stridor or swelling Cardiovascular: Negative for other palpitations or other chest pain  Gastrointestinal: Negative for worsening blood in stool, or other pain Genitourinary: Negative for hematuria, flank pain or other change in urine volume.  Musculoskeletal: Negative for myalgias or other joint swelling.  Skin: Negative for other color change, or other wound or worsening drainage.  Neurological: Negative for other syncope or numbness. Hematological: Negative for other adenopathy or swelling Psychiatric/Behavioral: Negative for hallucinations, other worsening agitation, SI, self-injury, or new decreased concentration All other system neg per pt    Objective:   Physical Exam BP 120/64   Pulse (!) 137   Temp 98.2 F (36.8 C) (Oral)   Ht 5\' 2"  (1.575 m)   Wt 247 lb (112 kg)   SpO2 96%   BMI 45.18 kg/m  VS noted, mild ill Constitutional: Pt is oriented to person, place, and time. Appears well-developed and well-nourished, in no significant distress and comfortable Head: Normocephalic and atraumatic  Eyes: Conjunctivae and EOM are normal. Pupils are equal, round, and reactive to light Bilat tm's with mild erythema.  Max sinus areas non tender.  Pharynx with mild erythema, no exudate Right Ear: External ear normal without discharge Left Ear: External ear normal without discharge Nose: Nose without discharge or deformity Mouth/Throat: Oropharynx is without other ulcerations and moist  Neck: Normal range of motion. Neck supple. No JVD present. No tracheal deviation present or significant neck LA or mass Cardiovascular: Normal rate, regular rhythm, normal heart sounds and intact distal pulses.   Pulmonary/Chest: WOB normal and breath sounds without rales or wheezing  Abdominal: Soft. Bowel sounds are normal. NT. No HSM  - benign exam, maybe slight distended Musculoskeletal:  Normal range of motion. Exhibits no edema Lymphadenopathy: Has no other cervical adenopathy.  Neurological: Pt is alert and oriented to person, place, and time. Pt has normal reflexes. No cranial nerve deficit. Motor grossly intact, Gait intact  Skin: Skin is warm and dry. No rash noted or new ulcerations Psychiatric:  Has normal mood and affect. Behavior is normal without agitation\ No other exam findings Lab Results  Component Value Date   WBC 11.3 (H) 03/23/2018   HGB 14.8 03/23/2018   HCT 45.4 03/23/2018   PLT 277.0 03/23/2018   GLUCOSE 143 (H) 03/23/2018   CHOL 163 03/23/2018   TRIG 168.0 (H) 03/23/2018   HDL 41.90 03/23/2018   LDLDIRECT 141.0 10/16/2015   LDLCALC 87 03/23/2018   ALT 15 03/23/2018   AST 14 03/23/2018   NA 140 03/23/2018   K 4.0 03/23/2018   CL 106 03/23/2018   CREATININE 0.90 03/23/2018   BUN 16 03/23/2018   CO2 24 03/23/2018   TSH 7.12 (H) 03/23/2018   INR 1.00 03/18/2016   HGBA1C 6.6 (H) 03/23/2018   MICROALBUR 96.6 (H) 03/17/2017      Assessment & Plan:

## 2018-04-04 DIAGNOSIS — Z1231 Encounter for screening mammogram for malignant neoplasm of breast: Secondary | ICD-10-CM | POA: Diagnosis not present

## 2018-04-10 ENCOUNTER — Other Ambulatory Visit: Payer: Self-pay | Admitting: Emergency Medicine

## 2018-04-10 ENCOUNTER — Other Ambulatory Visit: Payer: Self-pay | Admitting: Internal Medicine

## 2018-04-11 ENCOUNTER — Other Ambulatory Visit: Payer: Self-pay | Admitting: Internal Medicine

## 2018-04-11 NOTE — Telephone Encounter (Signed)
Spoke with Tavion at [facility] [address] who states that pt did not pick up prescription sent in on 02/22/18. Tavion states that prescription is on file and can be filled for the pt.

## 2018-04-11 NOTE — Telephone Encounter (Signed)
Copied from [place] [phone]. Topic: Quick Communication - Rx Refill/Question >> Apr 11, 2018  3:39 PM Windy Kalata wrote: Medication: furosemide (LASIX) 40 MG tablet     Has the patient contacted their pharmacy? Yes.   (Agent: If no, request that the patient contact the pharmacy for the refill.) (Agent: If yes, when and what did the pharmacy advise?) Needs a refill  [place], [place] - [address] [phone] (Phone) [phone] (Fax   Preferred Pharmacy (with phone number or street name):   Agent: Please be advised that RX refills may take up to 3 business days. We ask that you follow-up with your pharmacy.

## 2018-04-12 ENCOUNTER — Telehealth: Payer: Self-pay | Admitting: Emergency Medicine

## 2018-04-12 DIAGNOSIS — J449 Chronic obstructive pulmonary disease, unspecified: Secondary | ICD-10-CM | POA: Diagnosis not present

## 2018-04-12 MED ORDER — IPRATROPIUM-ALBUTEROL 0.5-2.5 (3) MG/3ML IN SOLN: RESPIRATORY_TRACT | 0 refills | Status: DC

## 2018-04-12 NOTE — Telephone Encounter (Signed)
Called and spoke to pt, who states that [facility] needs a dx code for duoneb.  I have spoken to [facility], who verified that dx code is needed.  Rx for duoneb has been resent to [facility] with dx code. Nothing further is needed.

## 2018-04-14 ENCOUNTER — Other Ambulatory Visit: Payer: Self-pay | Admitting: Internal Medicine

## 2018-04-15 ENCOUNTER — Encounter: Payer: Self-pay | Admitting: Family Medicine

## 2018-04-15 ENCOUNTER — Ambulatory Visit (INDEPENDENT_AMBULATORY_CARE_PROVIDER_SITE_OTHER): Payer: Medicare Other | Admitting: Family Medicine

## 2018-04-15 VITALS — BP 126/84 | HR 108 | Temp 97.6°F | Ht 62.0 in | Wt 259.0 lb

## 2018-04-15 DIAGNOSIS — R059 Cough, unspecified: Secondary | ICD-10-CM

## 2018-04-15 DIAGNOSIS — R05 Cough: Secondary | ICD-10-CM

## 2018-04-15 MED ORDER — PREDNISONE 50 MG PO TABS: 50.0000 mg | ORAL_TABLET | Freq: Every day | ORAL | 0 refills | Status: DC

## 2018-04-15 NOTE — Assessment & Plan Note (Signed)
Could be a flare of chronic cough vs acute with viral type symptoms. Doesn't appear to be a COPD exacerbation.  - prednisone  - counseled on supportive care - given indications to follow up and return.

## 2018-04-15 NOTE — Patient Instructions (Signed)
Nice to meet you  Honey, lozenges, Vicks vapor rub and humidifier can help with the cough. Please continue to use your regular medications. Please follow-up if your symptoms fail to improve into the middle of next week.

## 2018-04-15 NOTE — Progress Notes (Addendum)
Andrea Perry - 67 y.o. female MRN [id number]  Date of birth: 09/04/1950  SUBJECTIVE:  Including CC & ROS.  No chief complaint on file.   Andrea Perry is a 67 y.o. female that is  Presenting with cough.  She feels like her symptoms been ongoing for a couple of days.  She has been using her regular medications.  She has a history of COPD.  She is unsure if she is ever had a COPD exacerbation.  Denies any fevers.  Has had a mild production with nonbloody sputum.  Has been around other people with similar symptoms.  Denies any measured temperature or fever..    Review of Systems  Constitutional: Negative for fever.  HENT: Positive for congestion.   Respiratory: Positive for cough.   Cardiovascular: Negative for chest pain.  Gastrointestinal: Negative for abdominal pain.  Musculoskeletal: Negative for gait problem.    HISTORY: Past Medical, Surgical, Social, and Family History Reviewed & Updated per EMR.   Pertinent Historical Findings include:  Past Medical History:  Diagnosis Date  . ALLERGIC RHINITIS 08/16/2008  . ANXIETY 08/16/2008  . Arthritis    hands, knees, lower back  . ASTHMA 08/16/2008  . Asthma    BRONCHITIS     DR. Lamonte Sakai    . Bladder leak   . Cancer ([place])    MELANOMA      . Chronic lower back pain    problems with disc L2-5  . COPD 08/16/2008  . DEPRESSION 08/16/2008  . Dysrhythmia    hx AF  . Excessive daytime sleepiness 06/19/2015  . GENITAL HERPES 12/03/2006  . GERD (gastroesophageal reflux disease)   . H/O hiatal hernia   . HEMATOCHEZIA 06/20/2009  . Hemorrhoids   . History of cardioversion 10/30/14  . History of kidney stones   . HYPERLIPIDEMIA 08/16/2008  . HYPERTENSION 12/03/2006  . Hypertension   . Impaired glucose tolerance 09/21/2010  . Overactive bladder 10/20/2015  . Pneumonia    as a child  . Pulmonary HTN ([place]) 06/19/2015  . Shortness of breath dyspnea   . Sleep apnea    MILD NO CPAP ORDERED  . Snoring 06/19/2015    Past Surgical History:  Procedure  Laterality Date  . ABDOMINAL HYSTERECTOMY    . APPENDECTOMY    . CARDIOVERSION N/A 03/20/2014   Procedure: CARDIOVERSION;  Surgeon: Josue Hector, MD;  Location: [facility] ENDOSCOPY;  Service: Cardiovascular;  Laterality: N/A;  . CARDIOVERSION N/A 10/30/2014   Procedure: CARDIOVERSION;  Surgeon: Josue Hector, MD;  Location: [facility] ENDOSCOPY;  Service: Cardiovascular;  Laterality: N/A;  . CARPAL TUNNEL RELEASE     Right + LEFT  . CESAREAN SECTION     x 1   . COLONOSCOPY  2004  . CYST EXCISION     RT ARM   . CYSTOCELE REPAIR N/A 10/25/2012   Procedure: ANTERIOR REPAIR (CYSTOCELE);  Surgeon: Gus Height, MD;  Location: [facility];  Service: Gynecology;  Laterality: N/A;  . HEEL SPUR SURGERY Bilateral   . KNEE SURGERY     Right + LEFT  . LUMBAR LAMINECTOMY/DECOMPRESSION MICRODISCECTOMY N/A 03/19/2016   Procedure: Laminectomy and Foraminotomy - Lumbar three-four - Lumbar four-five;  Surgeon: Eustace Moore, MD;  Location: [place];  Service: Neurosurgery;  Laterality: N/A;  . RADIOLOGY WITH ANESTHESIA Right 11/01/2014   Procedure: MRI RIGHT FOREARM;  Surgeon: Medication Radiologist, MD;  Location: [place];  Service: Radiology;  Laterality: Right;  . RADIOLOGY WITH ANESTHESIA Right 08/29/2015   Procedure:  MRI - RIGHT FOREARM;  Surgeon: Medication Radiologist, MD;  Location: [place];  Service: Radiology;  Laterality: Right;  . RADIOLOGY WITH ANESTHESIA N/A 12/05/2015   Procedure: MRI SPINE WITHOUT;  Surgeon: Medication Radiologist, MD;  Location: [place];  Service: Radiology;  Laterality: N/A;  . SKIN CANCER EXCISION     BILAT SHOULDERS  . SPLIT NIGHT STUDY  09/04/2015  . TONSILLECTOMY AND ADENOIDECTOMY      Allergies  Allergen Reactions  . Tramadol Hcl Nausea And Vomiting and Other (See Comments)     mouth dryness, headache  . Codeine Nausea And Vomiting  . Tape Rash    Plastic tape, bandaids and ekg leads, causes redness and rash  . Vicodin [Hydrocodone-Acetaminophen] Nausea And Vomiting    Family History    Problem Relation Age of Onset  . Diabetes Mother   . Hyperlipidemia Mother   . Hypertension Mother   . Colon polyps Mother   . Stroke Father   . Colon cancer Neg Hx      Social History   Socioeconomic History  . Marital status: Widowed    Spouse name: Not on file  . Number of children: 1  . Years of education: Not on file  . Highest education level: Not on file  Occupational History  . Occupation: currently unemployed, former [employer]  Social Needs  . Financial resource strain: Not on file  . Food insecurity:    Worry: Not on file    Inability: Not on file  . Transportation needs:    Medical: Not on file    Non-medical: Not on file  Tobacco Use  . Smoking status: Former Smoker    Packs/day: 1.50    Years: 30.00    Pack years: 45.00    Types: Cigarettes    Last attempt to quit: 04/21/2003    Years since quitting: 14.9  . Smokeless tobacco: Never Used  Substance and Sexual Activity  . Alcohol use: Yes    Comment: OCCAS  . Drug use: No  . Sexual activity: Not Currently    Birth control/protection: None  Lifestyle  . Physical activity:    Days per week: Not on file    Minutes per session: Not on file  . Stress: Not on file  Relationships  . Social connections:    Talks on phone: Not on file    Gets together: Not on file    Attends religious service: Not on file    Active member of club or organization: Not on file    Attends meetings of clubs or organizations: Not on file    Relationship status: Not on file  . Intimate partner violence:    Fear of current or ex partner: Not on file    Emotionally abused: Not on file    Physically abused: Not on file    Forced sexual activity: Not on file  Other Topics Concern  . Not on file  Social History Narrative   3 caffeine drinks daily      PHYSICAL EXAM:  VS: BP 126/84   Pulse (!) 108   Temp 97.6 F (36.4 C) (Oral)   Ht 5\' 2"  (1.575 m)   Wt 259 lb (117.5 kg)   SpO2 92%   BMI 47.37 kg/m   Physical Exam Gen: NAD, alert, cooperative with exam, ENT: normal lips, normal nasal mucosa, tympanic membranes clear and intact bilaterally, normal oropharynx, no cervical lymphadenopathy Eye: normal EOM, normal conjunctiva and lids CV:  no  edema, +2 pedal pulses, regular rhythm, S1-S2   Resp: no accessory muscle use, non-labored, very mild end expiratory wheezing in upper lung fields.  Skin: no rashes, no areas of induration  Neuro: normal tone, normal sensation to touch Psych:  normal insight, alert and oriented MSK: Normal gait, normal strength       ASSESSMENT & PLAN:   Cough Could be a flare of chronic cough vs acute with viral type symptoms. Doesn't appear to be a COPD exacerbation.  - prednisone  - counseled on supportive care - given indications to follow up and return.

## 2018-04-27 ENCOUNTER — Other Ambulatory Visit: Payer: Self-pay | Admitting: Emergency Medicine

## 2018-04-29 ENCOUNTER — Ambulatory Visit (INDEPENDENT_AMBULATORY_CARE_PROVIDER_SITE_OTHER): Payer: Medicare Other | Admitting: Internal Medicine

## 2018-04-29 ENCOUNTER — Encounter: Payer: Self-pay | Admitting: Internal Medicine

## 2018-04-29 ENCOUNTER — Ambulatory Visit (INDEPENDENT_AMBULATORY_CARE_PROVIDER_SITE_OTHER)
Admission: RE | Admit: 2018-04-29 | Discharge: 2018-04-29 | Disposition: A | Discharge status: Home / Self Care | Payer: Medicare Other | Source: Ambulatory Visit | Attending: Internal Medicine | Admitting: Internal Medicine

## 2018-04-29 VITALS — BP 124/82 | HR 138 | Temp 98.2°F | Ht 62.0 in

## 2018-04-29 DIAGNOSIS — R062 Wheezing: Secondary | ICD-10-CM

## 2018-04-29 DIAGNOSIS — I4891 Unspecified atrial fibrillation: Secondary | ICD-10-CM | POA: Insufficient documentation

## 2018-04-29 DIAGNOSIS — I10 Essential (primary) hypertension: Secondary | ICD-10-CM | POA: Diagnosis not present

## 2018-04-29 DIAGNOSIS — J441 Chronic obstructive pulmonary disease with (acute) exacerbation: Secondary | ICD-10-CM

## 2018-04-29 DIAGNOSIS — R05 Cough: Secondary | ICD-10-CM | POA: Diagnosis not present

## 2018-04-29 MED ORDER — METHYLPREDNISOLONE ACETATE 80 MG/ML IJ SUSP
80.0000 mg | Freq: Once | INTRAMUSCULAR | Status: AC
  Administered 2018-04-29: 80 mg via INTRAMUSCULAR

## 2018-04-29 MED ORDER — AZITHROMYCIN 250 MG PO TABS: ORAL_TABLET | ORAL | 1 refills | Status: DC

## 2018-04-29 MED ORDER — PREDNISONE 10 MG PO TABS: ORAL_TABLET | ORAL | 0 refills | Status: DC

## 2018-04-29 MED ORDER — HYDROCODONE-HOMATROPINE 5-1.5 MG/5ML PO SYRP: 5.0000 mL | ORAL_SOLUTION | Freq: Four times a day (QID) | ORAL | 0 refills | Status: AC | PRN

## 2018-04-29 NOTE — Patient Instructions (Signed)
You had the steroid shot today  Please take all new medication as prescribed - the antibiotic, cough medicine as needed, and prednisone  Please continue all other medications as before, and refills have been done if requested.  Please have the pharmacy call with any other refills you may need.  Please continue your efforts at being more active, low cholesterol diet, and weight control.  Please keep your appointments with your specialists as you may have planned  Please go to the XRAY Department in the Basement (go straight as you get off the elevator) for the x-ray testing  You will be contacted by phone if any changes need to be made immediately.  Otherwise, you will receive a letter about your results with an explanation, but please check with MyChart first.  Please remember to sign up for MyChart if you have not done so, as this will be important to you in the future with finding out test results, communicating by private email, and scheduling acute appointments online when needed.

## 2018-04-29 NOTE — Assessment & Plan Note (Signed)
stable overall by history and exam, recent data reviewed with pt, and pt to continue medical treatment as before,  to f/u any worsening symptoms or concerns  

## 2018-04-29 NOTE — Assessment & Plan Note (Signed)
D/w pt, asympt, HR mild elevated, urged for ED but declines ED and any med change for now

## 2018-04-29 NOTE — Assessment & Plan Note (Signed)
Mild to mod, for antibx course, depomedrol IM 80, predpac asd, cough med prn, to f/u any worsening symptoms or concerns

## 2018-04-29 NOTE — Progress Notes (Signed)
Subjective:    Patient ID: Andrea Perry, female    DOB: [birth year], 68 y.o.   MRN: [id number]  HPI  Here with acute onset mild to mod 2 wks gradually worsening ST, HA, general weakness and malaise, with prod cough greenish sputum assoc with hoarseness as well, but Pt denies chest pain, increased sob or doe, wheezing, orthopnea, PND, increased LE swelling, palpitations, dizziness or syncope, except for onset mild wheezing and tightness 2 days ago.  Pt denies new neurological symptoms such as new headache, or facial or extremity weakness or numbness   Pt denies polydipsia, polyuria.  Has hx of afib, currently asympt with elev HR on arrival, better with rest Past Medical History:  Diagnosis Date  . ALLERGIC RHINITIS 08/16/2008  . ANXIETY 08/16/2008  . Arthritis    hands, knees, lower back  . ASTHMA 08/16/2008  . Asthma    BRONCHITIS     DR. Lamonte Sakai    . Bladder leak   . Cancer ([place])    MELANOMA      . Chronic lower back pain    problems with disc L2-5  . COPD 08/16/2008  . DEPRESSION 08/16/2008  . Dysrhythmia    hx AF  . Excessive daytime sleepiness 06/19/2015  . GENITAL HERPES 12/03/2006  . GERD (gastroesophageal reflux disease)   . H/O hiatal hernia   . HEMATOCHEZIA 06/20/2009  . Hemorrhoids   . History of cardioversion 10/30/14  . History of kidney stones   . HYPERLIPIDEMIA 08/16/2008  . HYPERTENSION 12/03/2006  . Hypertension   . Impaired glucose tolerance 09/21/2010  . Overactive bladder 10/20/2015  . Pneumonia    as a child  . Pulmonary HTN ([place]) 06/19/2015  . Shortness of breath dyspnea   . Sleep apnea    MILD NO CPAP ORDERED  . Snoring 06/19/2015   Past Surgical History:  Procedure Laterality Date  . ABDOMINAL HYSTERECTOMY    . APPENDECTOMY    . CARDIOVERSION N/A 03/20/2014   Procedure: CARDIOVERSION;  Surgeon: Josue Hector, MD;  Location: [facility] ENDOSCOPY;  Service: Cardiovascular;  Laterality: N/A;  . CARDIOVERSION N/A 10/30/2014   Procedure: CARDIOVERSION;  Surgeon: Josue Hector,  MD;  Location: [facility] ENDOSCOPY;  Service: Cardiovascular;  Laterality: N/A;  . CARPAL TUNNEL RELEASE     Right + LEFT  . CESAREAN SECTION     x 1   . COLONOSCOPY  2004  . CYST EXCISION     RT ARM   . CYSTOCELE REPAIR N/A 10/25/2012   Procedure: ANTERIOR REPAIR (CYSTOCELE);  Surgeon: Gus Height, MD;  Location: [facility];  Service: Gynecology;  Laterality: N/A;  . HEEL SPUR SURGERY Bilateral   . KNEE SURGERY     Right + LEFT  . LUMBAR LAMINECTOMY/DECOMPRESSION MICRODISCECTOMY N/A 03/19/2016   Procedure: Laminectomy and Foraminotomy - Lumbar three-four - Lumbar four-five;  Surgeon: Eustace Moore, MD;  Location: [place];  Service: Neurosurgery;  Laterality: N/A;  . RADIOLOGY WITH ANESTHESIA Right 11/01/2014   Procedure: MRI RIGHT FOREARM;  Surgeon: Medication Radiologist, MD;  Location: [place];  Service: Radiology;  Laterality: Right;  . RADIOLOGY WITH ANESTHESIA Right 08/29/2015   Procedure: MRI - RIGHT FOREARM;  Surgeon: Medication Radiologist, MD;  Location: [place];  Service: Radiology;  Laterality: Right;  . RADIOLOGY WITH ANESTHESIA N/A 12/05/2015   Procedure: MRI SPINE WITHOUT;  Surgeon: Medication Radiologist, MD;  Location: [place];  Service: Radiology;  Laterality: N/A;  . SKIN CANCER EXCISION     BILAT SHOULDERS  .  SPLIT NIGHT STUDY  09/04/2015  . TONSILLECTOMY AND ADENOIDECTOMY      reports that she quit smoking about 15 years ago. Her smoking use included cigarettes. She has a 45.00 pack-year smoking history. She has never used smokeless tobacco. She reports current alcohol use. She reports that she does not use drugs. family history includes Colon polyps in her mother; Diabetes in her mother; Hyperlipidemia in her mother; Hypertension in her mother; Stroke in her father. Allergies  Allergen Reactions  . Tramadol Hcl Nausea And Vomiting and Other (See Comments)     mouth dryness, headache  . Codeine Nausea And Vomiting  . Tape Rash    Plastic tape, bandaids and ekg leads, causes redness and  rash  . Vicodin [Hydrocodone-Acetaminophen] Nausea And Vomiting   Current Outpatient Medications on File Prior to Visit  Medication Sig Dispense Refill  . albuterol (PROVENTIL HFA;VENTOLIN HFA) 108 (90 Base) MCG/ACT inhaler Inhale 2 puffs by mouth every 6 hours as needed for wheezing or shortness of breath. **Yearly physical due in June** 1 Inhaler 11  . amLODipine (NORVASC) 10 MG tablet Take 1 tablet (10 mg total) by mouth daily. 90 tablet 3  . cetirizine (ZYRTEC) 10 MG tablet Take 10 mg by mouth daily.     . Cholecalciferol (VITAMIN D) 2000 units tablet Take 2,000 Units by mouth daily.    . Cyanocobalamin (VITAMELTS ENERGY VITAMIN B-12) 1500 MCG TBDP Take 1,500 mcg by mouth daily.    Marland Kitchen dimenhyDRINATE (DRAMAMINE) 50 MG tablet Take 50 mg by mouth daily as needed for nausea.    . diphenoxylate-atropine (LOMOTIL) 2.5-0.025 MG tablet Take 1 tablet by mouth 4 (four) times daily as needed for diarrhea or loose stools. 40 tablet 0  . ELIQUIS 5 MG TABS tablet TAKE 1 TABLET BY MOUTH TWICE DAILY 180 tablet 1  . escitalopram (LEXAPRO) 20 MG tablet Take 1 tablet (20 mg total) by mouth daily. 90 tablet 3  . estradiol (ESTRACE) 2 MG tablet Take 1 tablet (2 mg total) by mouth daily. 90 tablet 3  . flecainide (TAMBOCOR) 50 MG tablet Take 1 tablet (50 mg total) by mouth 2 (two) times daily. 180 tablet 3  . fluticasone (FLONASE) 50 MCG/ACT nasal spray Place 2 sprays into both nostrils daily. 16 g 2  . furosemide (LASIX) 40 MG tablet Take 1 tablet (40 mg total) by mouth daily. 90 tablet 0  . Garlic 6160 MG CAPS Take 1,000 mg by mouth at bedtime.     Marland Kitchen guaiFENesin (MUCINEX) 600 MG 12 hr tablet Take 1 tablet (600 mg total) by mouth 2 (two) times daily as needed for cough or to loosen phlegm. 60 tablet 2  . ipratropium-albuterol (DUONEB) 0.5-2.5 (3) MG/3ML SOLN USE 3 ML IN NEBULIZER  4 TIMES DAILY 180 mL 0  . irbesartan (AVAPRO) 150 MG tablet TAKE 1 TABLET BY MOUTH ONCE DAILY 30 tablet 11  . LORazepam (ATIVAN) 1 MG  tablet Take 1 tablet by mouth at bedtime as needed for anxiety. 90 tablet 1  . meloxicam (MOBIC) 15 MG tablet Take one pill a day with food for 7 days and then prn thereafter 30 tablet 0  . metoprolol tartrate (LOPRESSOR) 50 MG tablet TAKE 1 & 1/2 (ONE & ONE-HALF) TABLETS BY MOUTH TWICE DAILY 270 tablet 2  . nystatin (MYCOSTATIN/NYSTOP) powder Use as directed twice daily as needed 30 g 1  . Omega-3 Fatty Acids (FISH OIL) 1200 MG CAPS Take 1,200 mg by mouth daily.     Marland Kitchen  ondansetron (ZOFRAN) 4 MG tablet Take 1 tablet (4 mg total) by mouth every 8 (eight) hours as needed for nausea or vomiting. 30 tablet 1  . pantoprazole (PROTONIX) 40 MG tablet TAKE 1 TABLET BY MOUTH ONCE DAILY 90 tablet 1  . potassium chloride SA (K-DUR,KLOR-CON) 20 MEQ tablet Take 1 tablet (20 mEq total) by mouth daily. 90 tablet 3  . pravastatin (PRAVACHOL) 40 MG tablet TAKE 1 TABLET BY MOUTH ONCE DAILY 90 tablet 1  . Tetrahydrozoline HCl (VISINE OP) Place 1 drop into both eyes daily as needed (dry eyes).    . vitamin E 400 UNIT capsule Take 400 Units by mouth daily.    Marland Kitchen zolpidem (AMBIEN) 5 MG tablet Take 1 tablet (5 mg total) by mouth at bedtime as needed for sleep. 90 tablet 1   No current facility-administered medications on file prior to visit.    ROS:  Constitutional: Negative for other unusual diaphoresis or sweats HENT: Negative for ear discharge or swelling Eyes: Negative for other worsening visual disturbances Respiratory: Negative for stridor or other swelling  Gastrointestinal: Negative for worsening distension or other blood Genitourinary: Negative for retention or other urinary change Musculoskeletal: Negative for other MSK pain or swelling Skin: Negative for color change or other new lesions Neurological: Negative for worsening tremors and other numbness  Psychiatric/Behavioral: Negative for worsening agitation or other fatigue All other system neg per pt    Objective:   Physical Exam BP 124/82   Pulse  (!) 138   Temp 98.2 F (36.8 C) (Oral)   Ht 5\' 2"  (1.575 m)   SpO2 96%   BMI 47.37 kg/m   VS noted, non toxic, comfortable except for mild dyspnea with exertion Constitutional: Pt appears in NAD HENT: Head: NCAT.  Right Ear: External ear normal.  Left Ear: External ear normal.  Eyes: . Pupils are equal, round, and reactive to light. Conjunctivae and EOM are normal Nose: without d/c or deformity Neck: Neck supple. Gross normal ROM Cardiovascular: rreg irreg occas tachyardic but manual HR 110 on repeat Pulmonary/Chest: Effort normal and breath sounds without rales or wheezing.  Abd:  Soft, NT, ND, + BS, no organomegaly Neurological: Pt is alert. At baseline orientation, motor grossly intact Skin: Skin is warm. No rashes, other new lesions, no LE edema Psychiatric: Pt behavior is normal without agitation  No other exam findings  Lab Results  Component Value Date   WBC 11.3 (H) 03/23/2018   HGB 14.8 03/23/2018   HCT 45.4 03/23/2018   PLT 277.0 03/23/2018   GLUCOSE 143 (H) 03/23/2018   CHOL 163 03/23/2018   TRIG 168.0 (H) 03/23/2018   HDL 41.90 03/23/2018   LDLDIRECT 141.0 10/16/2015   LDLCALC 87 03/23/2018   ALT 15 03/23/2018   AST 14 03/23/2018   NA 140 03/23/2018   K 4.0 03/23/2018   CL 106 03/23/2018   CREATININE 0.90 03/23/2018   BUN 16 03/23/2018   CO2 24 03/23/2018   TSH 7.12 (H) 03/23/2018   INR 1.00 03/18/2016   HGBA1C 6.6 (H) 03/23/2018   MICROALBUR 96.6 (H) 03/17/2017      Assessment & Plan:

## 2018-04-30 ENCOUNTER — Encounter: Payer: Self-pay | Admitting: Internal Medicine

## 2018-05-01 ENCOUNTER — Other Ambulatory Visit: Payer: Self-pay | Admitting: Emergency Medicine

## 2018-05-02 ENCOUNTER — Telehealth: Payer: Self-pay | Admitting: Emergency Medicine

## 2018-05-02 MED ORDER — ALBUTEROL SULFATE HFA 108 (90 BASE) MCG/ACT IN AERS: INHALATION_SPRAY | RESPIRATORY_TRACT | 11 refills | Status: DC

## 2018-05-02 NOTE — Telephone Encounter (Signed)
Called and spoke with Patient.  She stated that she is needing refills for Albuterol inhaler and Duo neb sent to [facility] [place] [place].  [place].  Refill for Duo Neb sent to preferred pharmacy today by Ria Comment, CMA.  Refill for Albuterol inhaler sent to preferred pharmacy.  Nothing further at this time.

## 2018-05-04 ENCOUNTER — Other Ambulatory Visit: Payer: Self-pay | Admitting: Internal Medicine

## 2018-05-09 ENCOUNTER — Telehealth: Payer: Self-pay

## 2018-05-09 NOTE — Telephone Encounter (Signed)
-----   Message from Isaiah Serge, NP sent at 05/09/2018 10:42 AM EST ----- Can you touch base with pt --I do not see that she had DCCV or follow up with A fib clinic.  Maybe just follow up in a fib clinic.  Thanks.  ----- Message ----- From: Josue Hector, MD Sent: 10/04/2017   8:38 AM EST To: Isaiah Serge, NP  Ok to arrange [facility] on f/u   ----- Message ----- From: Isaiah Serge, NP Sent: 09/27/2017   4:43 PM To: Josue Hector, MD  Nish, I believe she needs DCCV for persistent a fib.  She has severe lower ext edema.  She has been on eliquis consistently for 2 weeks, I will see her back in 2 weeks and arrange if you agree.  Should we do TEE?   Thanks. Mickel Baas

## 2018-05-09 NOTE — Telephone Encounter (Signed)
Patient stated she has been having trouble affording her Eliquis 5 mg. Will see if patient can get financial assistance. Made patient an appointment in March with A. FIB clinic. This was the patient's choice to see [facility] FIB Clinic in March, not sure what is going on with patient's finances. Will give patient a few samples of eliquis to help her currently, but not sure if patient is going to be able to afford medication. Along with samples, will be giving directions to A. FIB clinic and forms for patient assistance for Eliquis. Will forward to [place] to follow up with patient assistance forms.

## 2018-05-09 NOTE — Telephone Encounter (Signed)
I called [place] to find out how much Eliquis will cost the pt and was advised that 30 day supply will cost her $37. I called the pt but got no ans so I left a detailed message on her VM stating that she is not eligible for pt asst for because she has insurance that is covering her Eliquis. I also asked that she return my call so we can discuss.

## 2018-05-18 ENCOUNTER — Encounter: Payer: Self-pay | Admitting: Internal Medicine

## 2018-05-18 ENCOUNTER — Ambulatory Visit (INDEPENDENT_AMBULATORY_CARE_PROVIDER_SITE_OTHER): Payer: Medicare Other | Admitting: Internal Medicine

## 2018-05-18 VITALS — BP 124/82 | HR 105 | Temp 98.0°F | Ht 62.0 in | Wt 253.0 lb

## 2018-05-18 DIAGNOSIS — R05 Cough: Secondary | ICD-10-CM

## 2018-05-18 DIAGNOSIS — E119 Type 2 diabetes mellitus without complications: Secondary | ICD-10-CM | POA: Diagnosis not present

## 2018-05-18 DIAGNOSIS — J441 Chronic obstructive pulmonary disease with (acute) exacerbation: Secondary | ICD-10-CM

## 2018-05-18 DIAGNOSIS — R059 Cough, unspecified: Secondary | ICD-10-CM

## 2018-05-18 MED ORDER — LEVOFLOXACIN 500 MG PO TABS: 500.0000 mg | ORAL_TABLET | Freq: Every day | ORAL | 0 refills | Status: AC

## 2018-05-18 MED ORDER — PROMETHAZINE-DM 6.25-15 MG/5ML PO SYRP: 5.0000 mL | ORAL_SOLUTION | Freq: Four times a day (QID) | ORAL | 1 refills | Status: DC | PRN

## 2018-05-18 MED ORDER — METHYLPREDNISOLONE ACETATE 80 MG/ML IJ SUSP
80.0000 mg | Freq: Once | INTRAMUSCULAR | Status: AC
  Administered 2018-05-18: 80 mg via INTRAMUSCULAR

## 2018-05-18 MED ORDER — PREDNISONE 10 MG PO TABS: ORAL_TABLET | ORAL | 0 refills | Status: DC

## 2018-05-18 NOTE — Patient Instructions (Signed)
You had the steroid shot today  Please take all new medication as prescribed - the antibiotic, cough medicine as needed, and prednisone  Please continue all other medications as before, and refills have been done if requested.  Please have the pharmacy call with any other refills you may need.  Please keep your appointments with your specialists as you may have planned

## 2018-05-18 NOTE — Progress Notes (Signed)
Subjective:    Patient ID: Andrea Perry, female    DOB: 28-Nov-1950, 68 y.o.   MRN: [id number]  HPI  Here with acute onset mild to mod 2-3 days ST, HA, general weakness and malaise, with prod cough greenish sputum, but Pt denies chest pain, increased sob or doe, wheezing, orthopnea, PND, increased LE swelling, palpitations, dizziness or syncope, except for onset mild wheezing and sob since last PM Past Medical History:  Diagnosis Date  . ALLERGIC RHINITIS 08/16/2008  . ANXIETY 08/16/2008  . Arthritis    hands, knees, lower back  . ASTHMA 08/16/2008  . Asthma    BRONCHITIS     DR. Lamonte Sakai    . Bladder leak   . Cancer ([place])    MELANOMA      . Chronic lower back pain    problems with disc L2-5  . COPD 08/16/2008  . DEPRESSION 08/16/2008  . Dysrhythmia    hx AF  . Excessive daytime sleepiness 06/19/2015  . GENITAL HERPES 12/03/2006  . GERD (gastroesophageal reflux disease)   . H/O hiatal hernia   . HEMATOCHEZIA 06/20/2009  . Hemorrhoids   . History of cardioversion 10/30/14  . History of kidney stones   . HYPERLIPIDEMIA 08/16/2008  . HYPERTENSION 12/03/2006  . Hypertension   . Impaired glucose tolerance 09/21/2010  . Overactive bladder 10/20/2015  . Pneumonia    as a child  . Pulmonary HTN ([place]) 06/19/2015  . Shortness of breath dyspnea   . Sleep apnea    MILD NO CPAP ORDERED  . Snoring 06/19/2015   Past Surgical History:  Procedure Laterality Date  . ABDOMINAL HYSTERECTOMY    . APPENDECTOMY    . CARDIOVERSION N/A 03/20/2014   Procedure: CARDIOVERSION;  Surgeon: Josue Hector, MD;  Location: [facility] ENDOSCOPY;  Service: Cardiovascular;  Laterality: N/A;  . CARDIOVERSION N/A 10/30/2014   Procedure: CARDIOVERSION;  Surgeon: Josue Hector, MD;  Location: [facility] ENDOSCOPY;  Service: Cardiovascular;  Laterality: N/A;  . CARPAL TUNNEL RELEASE     Right + LEFT  . CESAREAN SECTION     x 1   . COLONOSCOPY  2004  . CYST EXCISION     RT ARM   . CYSTOCELE REPAIR N/A 10/25/2012   Procedure: ANTERIOR  REPAIR (CYSTOCELE);  Surgeon: Gus Height, MD;  Location: [facility];  Service: Gynecology;  Laterality: N/A;  . HEEL SPUR SURGERY Bilateral   . KNEE SURGERY     Right + LEFT  . LUMBAR LAMINECTOMY/DECOMPRESSION MICRODISCECTOMY N/A 03/19/2016   Procedure: Laminectomy and Foraminotomy - Lumbar three-four - Lumbar four-five;  Surgeon: Eustace Moore, MD;  Location: [place];  Service: Neurosurgery;  Laterality: N/A;  . RADIOLOGY WITH ANESTHESIA Right 11/01/2014   Procedure: MRI RIGHT FOREARM;  Surgeon: Medication Radiologist, MD;  Location: [place];  Service: Radiology;  Laterality: Right;  . RADIOLOGY WITH ANESTHESIA Right 08/29/2015   Procedure: MRI - RIGHT FOREARM;  Surgeon: Medication Radiologist, MD;  Location: [place];  Service: Radiology;  Laterality: Right;  . RADIOLOGY WITH ANESTHESIA N/A 12/05/2015   Procedure: MRI SPINE WITHOUT;  Surgeon: Medication Radiologist, MD;  Location: [place];  Service: Radiology;  Laterality: N/A;  . SKIN CANCER EXCISION     BILAT SHOULDERS  . SPLIT NIGHT STUDY  09/04/2015  . TONSILLECTOMY AND ADENOIDECTOMY      reports that she quit smoking about 15 years ago. Her smoking use included cigarettes. She has a 45.00 pack-year smoking history. She has never used smokeless tobacco. She  reports current alcohol use. She reports that she does not use drugs. family history includes Colon polyps in her mother; Diabetes in her mother; Hyperlipidemia in her mother; Hypertension in her mother; Stroke in her father. Allergies  Allergen Reactions  . Tramadol Hcl Nausea And Vomiting and Other (See Comments)     mouth dryness, headache  . Codeine Nausea And Vomiting  . Tape Rash    Plastic tape, bandaids and ekg leads, causes redness and rash  . Vicodin [Hydrocodone-Acetaminophen] Nausea And Vomiting   Current Outpatient Medications on File Prior to Visit  Medication Sig Dispense Refill  . albuterol (PROVENTIL HFA;VENTOLIN HFA) 108 (90 Base) MCG/ACT inhaler Inhale 2 puffs by mouth every  6 hours as needed for wheezing or shortness of breath. **Yearly physical due in June** 1 Inhaler 11  . amLODipine (NORVASC) 10 MG tablet Take 1 tablet (10 mg total) by mouth daily. 90 tablet 3  . azithromycin (ZITHROMAX Z-PAK) 250 MG tablet 2 tab by mouth day 1, then 1 per day 6 tablet 1  . cetirizine (ZYRTEC) 10 MG tablet Take 10 mg by mouth daily.     . Cholecalciferol (VITAMIN D) 2000 units tablet Take 2,000 Units by mouth daily.    . Cyanocobalamin (VITAMELTS ENERGY VITAMIN B-12) 1500 MCG TBDP Take 1,500 mcg by mouth daily.    Marland Kitchen dimenhyDRINATE (DRAMAMINE) 50 MG tablet Take 50 mg by mouth daily as needed for nausea.    . diphenoxylate-atropine (LOMOTIL) 2.5-0.025 MG tablet Take 1 tablet by mouth 4 (four) times daily as needed for diarrhea or loose stools. 40 tablet 0  . ELIQUIS 5 MG TABS tablet TAKE 1 TABLET BY MOUTH TWICE DAILY 180 tablet 1  . escitalopram (LEXAPRO) 20 MG tablet Take 1 tablet (20 mg total) by mouth daily. 90 tablet 3  . estradiol (ESTRACE) 2 MG tablet Take 1 tablet (2 mg total) by mouth daily. 90 tablet 3  . flecainide (TAMBOCOR) 50 MG tablet Take 1 tablet (50 mg total) by mouth 2 (two) times daily. 180 tablet 3  . fluticasone (FLONASE) 50 MCG/ACT nasal spray Place 2 sprays into both nostrils daily. 16 g 2  . furosemide (LASIX) 40 MG tablet Take 1 tablet (40 mg total) by mouth daily. 90 tablet 0  . Garlic 1610 MG CAPS Take 1,000 mg by mouth at bedtime.     Marland Kitchen guaiFENesin (MUCINEX) 600 MG 12 hr tablet Take 1 tablet (600 mg total) by mouth 2 (two) times daily as needed for cough or to loosen phlegm. 60 tablet 2  . ipratropium-albuterol (DUONEB) 0.5-2.5 (3) MG/3ML SOLN USE 1 AMPULE IN NEBULIZER 4 TIMES DAILY 180 mL 1  . irbesartan (AVAPRO) 150 MG tablet TAKE 1 TABLET BY MOUTH ONCE DAILY 30 tablet 11  . LORazepam (ATIVAN) 1 MG tablet Take 1 tablet by mouth at bedtime as needed for anxiety. 90 tablet 1  . meloxicam (MOBIC) 15 MG tablet Take one pill a day with food for 7 days and  then prn thereafter 30 tablet 0  . metoprolol tartrate (LOPRESSOR) 50 MG tablet TAKE 1 & 1/2 (ONE & ONE-HALF) TABLETS BY MOUTH TWICE DAILY 270 tablet 2  . nystatin (MYCOSTATIN/NYSTOP) powder Use as directed twice daily as needed 30 g 1  . Omega-3 Fatty Acids (FISH OIL) 1200 MG CAPS Take 1,200 mg by mouth daily.     . ondansetron (ZOFRAN) 4 MG tablet Take 1 tablet (4 mg total) by mouth every 8 (eight) hours as needed for nausea or vomiting.  30 tablet 1  . pantoprazole (PROTONIX) 40 MG tablet TAKE 1 TABLET BY MOUTH ONCE DAILY 90 tablet 1  . potassium chloride SA (K-DUR,KLOR-CON) 20 MEQ tablet Take 1 tablet (20 mEq total) by mouth daily. 90 tablet 3  . pravastatin (PRAVACHOL) 40 MG tablet TAKE 1 TABLET BY MOUTH ONCE DAILY 90 tablet 1  . Tetrahydrozoline HCl (VISINE OP) Place 1 drop into both eyes daily as needed (dry eyes).    . vitamin E 400 UNIT capsule Take 400 Units by mouth daily.    Marland Kitchen zolpidem (AMBIEN) 5 MG tablet Take 1 tablet (5 mg total) by mouth at bedtime as needed for sleep. 90 tablet 1   No current facility-administered medications on file prior to visit.    Review of Systems  Constitutional: Negative for other unusual diaphoresis or sweats HENT: Negative for ear discharge or swelling Eyes: Negative for other worsening visual disturbances Respiratory: Negative for stridor or other swelling  Gastrointestinal: Negative for worsening distension or other blood Genitourinary: Negative for retention or other urinary change Musculoskeletal: Negative for other MSK pain or swelling Skin: Negative for color change or other new lesions Neurological: Negative for worsening tremors and other numbness  Psychiatric/Behavioral: Negative for worsening agitation or other fatigue All other system neg per pt    Objective:   Physical Exam BP 124/82   Pulse (!) 105   Temp 98 F (36.7 C) (Oral)   Ht 5\' 2"  (1.575 m)   Wt 253 lb (114.8 kg)   SpO2 93%   BMI 46.27 kg/m  VS noted, mild  ill Constitutional: Pt appears in NAD HENT: Head: NCAT.  Right Ear: External ear normal.  Left Ear: External ear normal.  Eyes: . Pupils are equal, round, and reactive to light. Conjunctivae and EOM are normal Nose: without d/c or deformity Neck: Neck supple. Gross normal ROM Cardiovascular: Normal rate and regular rhythm.   Pulmonary/Chest: Effort normal and breath sounds decreased without rales but with few trace bilat wheezing.  Abd:  Soft, NT, ND, + BS, no organomegaly Neurological: Pt is alert. At baseline orientation, motor grossly intact Skin: Skin is warm. No rashes, other new lesions, no LE edema Psychiatric: Pt behavior is normal without agitation  No other exam findings Lab Results  Component Value Date   WBC 11.3 (H) 03/23/2018   HGB 14.8 03/23/2018   HCT 45.4 03/23/2018   PLT 277.0 03/23/2018   GLUCOSE 143 (H) 03/23/2018   CHOL 163 03/23/2018   TRIG 168.0 (H) 03/23/2018   HDL 41.90 03/23/2018   LDLDIRECT 141.0 10/16/2015   LDLCALC 87 03/23/2018   ALT 15 03/23/2018   AST 14 03/23/2018   NA 140 03/23/2018   K 4.0 03/23/2018   CL 106 03/23/2018   CREATININE 0.90 03/23/2018   BUN 16 03/23/2018   CO2 24 03/23/2018   TSH 7.12 (H) 03/23/2018   INR 1.00 03/18/2016   HGBA1C 6.6 (H) 03/23/2018   MICROALBUR 96.6 (H) 03/17/2017       Assessment & Plan:

## 2018-05-19 NOTE — Assessment & Plan Note (Signed)
Mild to mod, c/w bronchitis vs pna, declines cxr, for antibx course, to f/u any worsening symptoms or concerns  

## 2018-05-19 NOTE — Assessment & Plan Note (Signed)
Mild to mod, for depomedrol IM 80, and predpac asd,  to f/u any worsening symptoms or concerns

## 2018-05-19 NOTE — Assessment & Plan Note (Signed)
stable overall by history and exam, recent data reviewed with pt, and pt to continue medical treatment as before,  to f/u any worsening symptoms or concerns, to follow closely with prednisone tx

## 2018-06-21 ENCOUNTER — Ambulatory Visit (HOSPITAL_COMMUNITY): Payer: Medicare Other | Admitting: Nurse Practitioner

## 2018-06-21 ENCOUNTER — Other Ambulatory Visit: Payer: Self-pay | Admitting: Emergency Medicine

## 2018-06-24 ENCOUNTER — Ambulatory Visit (HOSPITAL_COMMUNITY): Payer: Medicare Other | Admitting: Nurse Practitioner

## 2018-06-24 ENCOUNTER — Telehealth: Payer: Self-pay | Admitting: Emergency Medicine

## 2018-06-24 DIAGNOSIS — R05 Cough: Secondary | ICD-10-CM

## 2018-06-24 DIAGNOSIS — R053 Chronic cough: Secondary | ICD-10-CM

## 2018-06-24 MED ORDER — IPRATROPIUM-ALBUTEROL 0.5-2.5 (3) MG/3ML IN SOLN: RESPIRATORY_TRACT | 1 refills | Status: DC

## 2018-06-24 NOTE — Telephone Encounter (Signed)
Called the patient, confirmed she needed refill of Duoneb and was completely out of the medication. Pharmacy confirmed. Advised the patient to call the pharmayc iin about an hour and let them know she was checking the status of electronic prescription sent and was out of medication and needs to pick it up.  Patient voiced understanding. Nothing further needed at this time.

## 2018-06-25 DIAGNOSIS — J439 Emphysema, unspecified: Secondary | ICD-10-CM | POA: Diagnosis not present

## 2018-07-20 ENCOUNTER — Telehealth: Payer: Self-pay

## 2018-07-20 NOTE — Telephone Encounter (Signed)
F/U Message          Patient is returning your call would like a call back. [name] I)

## 2018-07-20 NOTE — Telephone Encounter (Signed)
Left message for patient to call back. Calling to see if patient can do a virtual or telephone visit.

## 2018-07-20 NOTE — Telephone Encounter (Signed)
Patient canceled her appointment with scheduler. Scheduler rescheduled patient at a later date.

## 2018-07-20 NOTE — Telephone Encounter (Signed)
Left message for patient to call back  

## 2018-07-26 ENCOUNTER — Other Ambulatory Visit: Payer: Self-pay | Admitting: Emergency Medicine

## 2018-07-26 DIAGNOSIS — R053 Chronic cough: Secondary | ICD-10-CM

## 2018-07-26 DIAGNOSIS — R05 Cough: Secondary | ICD-10-CM

## 2018-07-29 ENCOUNTER — Ambulatory Visit: Payer: Medicare Other | Admitting: Cardiovascular Disease

## 2018-08-11 ENCOUNTER — Other Ambulatory Visit: Payer: Self-pay | Admitting: Emergency Medicine

## 2018-08-11 ENCOUNTER — Other Ambulatory Visit: Payer: Self-pay | Admitting: Internal Medicine

## 2018-08-11 DIAGNOSIS — R05 Cough: Secondary | ICD-10-CM

## 2018-08-11 DIAGNOSIS — R053 Chronic cough: Secondary | ICD-10-CM

## 2018-08-16 ENCOUNTER — Ambulatory Visit (INDEPENDENT_AMBULATORY_CARE_PROVIDER_SITE_OTHER): Payer: Medicare Other | Admitting: Internal Medicine

## 2018-08-16 ENCOUNTER — Encounter: Payer: Self-pay | Admitting: Internal Medicine

## 2018-08-16 DIAGNOSIS — E119 Type 2 diabetes mellitus without complications: Secondary | ICD-10-CM | POA: Diagnosis not present

## 2018-08-16 DIAGNOSIS — F411 Generalized anxiety disorder: Secondary | ICD-10-CM | POA: Diagnosis not present

## 2018-08-16 DIAGNOSIS — J441 Chronic obstructive pulmonary disease with (acute) exacerbation: Secondary | ICD-10-CM | POA: Diagnosis not present

## 2018-08-16 MED ORDER — DOXYCYCLINE HYCLATE 100 MG PO TABS: 100.0000 mg | ORAL_TABLET | Freq: Two times a day (BID) | ORAL | 0 refills | Status: DC

## 2018-08-16 MED ORDER — LORAZEPAM 1 MG PO TABS: ORAL_TABLET | ORAL | 2 refills | Status: DC

## 2018-08-16 MED ORDER — HYDROCODONE-HOMATROPINE 5-1.5 MG/5ML PO SYRP: 5.0000 mL | ORAL_SOLUTION | Freq: Four times a day (QID) | ORAL | 0 refills | Status: AC | PRN

## 2018-08-16 MED ORDER — PREDNISONE 10 MG PO TABS: ORAL_TABLET | ORAL | 0 refills | Status: DC

## 2018-08-16 NOTE — Progress Notes (Signed)
Patient ID: Andrea Perry, female   DOB: [birth year], 68 y.o.   MRN: [id number]  Virtual Visit via Video Note but failed video so Phone only  I connected with Andrea Perry on 08/16/18 at  2:00 PM EDT by a video enabled telemedicine application and verified that I am speaking with the correct person using two identifiers. Pt is at home, I am at office, and no other persons present   I discussed the limitations of evaluation and management by telemedicine and the availability of in person appointments. The patient expressed understanding and agreed to proceed.  History of Present Illness: 68 yo F with hx of COPD who c/o 2-3 days acute onset fever, headache, general weakness and malaise, with mild ST and non prod cough associated with mild to mod wheezing, increased sob or doe, but no CP , orthopnea, PND, increased LE swelling, palpitations, dizziness or syncope.  Denies worsening depressive symptoms, suicidal ideation, or panic   Pt denies polydipsia, polyuria,  Past Medical History:  Diagnosis Date  . ALLERGIC RHINITIS 08/16/2008  . ANXIETY 08/16/2008  . Arthritis    hands, knees, lower back  . ASTHMA 08/16/2008  . Asthma    BRONCHITIS     DR. Lamonte Sakai    . Bladder leak   . Cancer ([place])    MELANOMA      . Chronic lower back pain    problems with disc L2-5  . COPD 08/16/2008  . DEPRESSION 08/16/2008  . Dysrhythmia    hx AF  . Excessive daytime sleepiness 06/19/2015  . GENITAL HERPES 12/03/2006  . GERD (gastroesophageal reflux disease)   . H/O hiatal hernia   . HEMATOCHEZIA 06/20/2009  . Hemorrhoids   . History of cardioversion 10/30/14  . History of kidney stones   . HYPERLIPIDEMIA 08/16/2008  . HYPERTENSION 12/03/2006  . Hypertension   . Impaired glucose tolerance 09/21/2010  . Overactive bladder 10/20/2015  . Pneumonia    as a child  . Pulmonary HTN ([place]) 06/19/2015  . Shortness of breath dyspnea   . Sleep apnea    MILD NO CPAP ORDERED  . Snoring 06/19/2015   Past Surgical History:  Procedure  Laterality Date  . ABDOMINAL HYSTERECTOMY    . APPENDECTOMY    . CARDIOVERSION N/A 03/20/2014   Procedure: CARDIOVERSION;  Surgeon: Josue Hector, MD;  Location: [facility] ENDOSCOPY;  Service: Cardiovascular;  Laterality: N/A;  . CARDIOVERSION N/A 10/30/2014   Procedure: CARDIOVERSION;  Surgeon: Josue Hector, MD;  Location: [facility] ENDOSCOPY;  Service: Cardiovascular;  Laterality: N/A;  . CARPAL TUNNEL RELEASE     Right + LEFT  . CESAREAN SECTION     x 1   . COLONOSCOPY  2004  . CYST EXCISION     RT ARM   . CYSTOCELE REPAIR N/A 10/25/2012   Procedure: ANTERIOR REPAIR (CYSTOCELE);  Surgeon: Gus Height, MD;  Location: [facility];  Service: Gynecology;  Laterality: N/A;  . HEEL SPUR SURGERY Bilateral   . KNEE SURGERY     Right + LEFT  . LUMBAR LAMINECTOMY/DECOMPRESSION MICRODISCECTOMY N/A 03/19/2016   Procedure: Laminectomy and Foraminotomy - Lumbar three-four - Lumbar four-five;  Surgeon: Eustace Moore, MD;  Location: [place];  Service: Neurosurgery;  Laterality: N/A;  . RADIOLOGY WITH ANESTHESIA Right 11/01/2014   Procedure: MRI RIGHT FOREARM;  Surgeon: Medication Radiologist, MD;  Location: [place];  Service: Radiology;  Laterality: Right;  . RADIOLOGY WITH ANESTHESIA Right 08/29/2015   Procedure: MRI - RIGHT FOREARM;  Surgeon:  Medication Radiologist, MD;  Location: [place];  Service: Radiology;  Laterality: Right;  . RADIOLOGY WITH ANESTHESIA N/A 12/05/2015   Procedure: MRI SPINE WITHOUT;  Surgeon: Medication Radiologist, MD;  Location: [place];  Service: Radiology;  Laterality: N/A;  . SKIN CANCER EXCISION     BILAT SHOULDERS  . SPLIT NIGHT STUDY  09/04/2015  . TONSILLECTOMY AND ADENOIDECTOMY      reports that she quit smoking about 15 years ago. Her smoking use included cigarettes. She has a 45.00 pack-year smoking history. She has never used smokeless tobacco. She reports current alcohol use. She reports that she does not use drugs. family history includes Colon polyps in her mother; Diabetes in her mother;  Hyperlipidemia in her mother; Hypertension in her mother; Stroke in her father. Allergies  Allergen Reactions  . Tramadol Hcl Nausea And Vomiting and Other (See Comments)     mouth dryness, headache  . Codeine Nausea And Vomiting  . Tape Rash    Plastic tape, bandaids and ekg leads, causes redness and rash  . Vicodin [Hydrocodone-Acetaminophen] Nausea And Vomiting   Current Outpatient Medications on File Prior to Visit  Medication Sig Dispense Refill  . albuterol (PROVENTIL HFA;VENTOLIN HFA) 108 (90 Base) MCG/ACT inhaler Inhale 2 puffs by mouth every 6 hours as needed for wheezing or shortness of breath. **Yearly physical due in June** 1 Inhaler 11  . amLODipine (NORVASC) 10 MG tablet Take 1 tablet (10 mg total) by mouth daily. 90 tablet 3  . cetirizine (ZYRTEC) 10 MG tablet Take 10 mg by mouth daily.     . Cholecalciferol (VITAMIN D) 2000 units tablet Take 2,000 Units by mouth daily.    . Cyanocobalamin (VITAMELTS ENERGY VITAMIN B-12) 1500 MCG TBDP Take 1,500 mcg by mouth daily.    Marland Kitchen dimenhyDRINATE (DRAMAMINE) 50 MG tablet Take 50 mg by mouth daily as needed for nausea.    . diphenoxylate-atropine (LOMOTIL) 2.5-0.025 MG tablet Take 1 tablet by mouth 4 (four) times daily as needed for diarrhea or loose stools. 40 tablet 0  . ELIQUIS 5 MG TABS tablet TAKE 1 TABLET BY MOUTH TWICE DAILY 180 tablet 1  . escitalopram (LEXAPRO) 20 MG tablet Take 1 tablet (20 mg total) by mouth daily. 90 tablet 3  . estradiol (ESTRACE) 2 MG tablet Take 1 tablet (2 mg total) by mouth daily. 90 tablet 3  . flecainide (TAMBOCOR) 50 MG tablet Take 1 tablet (50 mg total) by mouth 2 (two) times daily. 180 tablet 3  . fluticasone (FLONASE) 50 MCG/ACT nasal spray Place 2 sprays into both nostrils daily. 16 g 2  . furosemide (LASIX) 40 MG tablet Take 1 tablet by mouth once daily 90 tablet 1  . Garlic 7989 MG CAPS Take 1,000 mg by mouth at bedtime.     Marland Kitchen guaiFENesin (MUCINEX) 600 MG 12 hr tablet Take 1 tablet (600 mg total)  by mouth 2 (two) times daily as needed for cough or to loosen phlegm. 60 tablet 2  . ipratropium-albuterol (DUONEB) 0.5-2.5 (3) MG/3ML SOLN USE 1 AMPULE IN NEBULIZER 4 TIMES DAILY 180 mL 2  . irbesartan (AVAPRO) 150 MG tablet TAKE 1 TABLET BY MOUTH ONCE DAILY 30 tablet 11  . meloxicam (MOBIC) 15 MG tablet Take one pill a day with food for 7 days and then prn thereafter 30 tablet 0  . metoprolol tartrate (LOPRESSOR) 50 MG tablet TAKE 1 & 1/2 (ONE & ONE-HALF) TABLETS BY MOUTH TWICE DAILY 270 tablet 2  . nystatin (MYCOSTATIN/NYSTOP) powder Use  as directed twice daily as needed 30 g 1  . Omega-3 Fatty Acids (FISH OIL) 1200 MG CAPS Take 1,200 mg by mouth daily.     . ondansetron (ZOFRAN) 4 MG tablet Take 1 tablet (4 mg total) by mouth every 8 (eight) hours as needed for nausea or vomiting. 30 tablet 1  . pantoprazole (PROTONIX) 40 MG tablet TAKE 1 TABLET BY MOUTH ONCE DAILY 90 tablet 1  . potassium chloride SA (K-DUR,KLOR-CON) 20 MEQ tablet Take 1 tablet (20 mEq total) by mouth daily. 90 tablet 3  . pravastatin (PRAVACHOL) 40 MG tablet TAKE 1 TABLET BY MOUTH ONCE DAILY 90 tablet 1  . Tetrahydrozoline HCl (VISINE OP) Place 1 drop into both eyes daily as needed (dry eyes).    . vitamin E 400 UNIT capsule Take 400 Units by mouth daily.    Marland Kitchen zolpidem (AMBIEN) 5 MG tablet Take 1 tablet (5 mg total) by mouth at bedtime as needed for sleep. 90 tablet 1   No current facility-administered medications on file prior to visit.    Observations/Objective: Unable over the phone only Lab Results  Component Value Date   WBC 11.3 (H) 03/23/2018   HGB 14.8 03/23/2018   HCT 45.4 03/23/2018   PLT 277.0 03/23/2018   GLUCOSE 143 (H) 03/23/2018   CHOL 163 03/23/2018   TRIG 168.0 (H) 03/23/2018   HDL 41.90 03/23/2018   LDLDIRECT 141.0 10/16/2015   LDLCALC 87 03/23/2018   ALT 15 03/23/2018   AST 14 03/23/2018   NA 140 03/23/2018   K 4.0 03/23/2018   CL 106 03/23/2018   CREATININE 0.90 03/23/2018   BUN 16  03/23/2018   CO2 24 03/23/2018   TSH 7.12 (H) 03/23/2018   INR 1.00 03/18/2016   HGBA1C 6.6 (H) 03/23/2018   MICROALBUR 96.6 (H) 03/17/2017   Assessment and Plan: See notes  Follow Up Instructions: See notes   I discussed the assessment and treatment plan with the patient. The patient was provided an opportunity to ask questions and all were answered. The patient agreed with the plan and demonstrated an understanding of the instructions.   The patient was advised to call back or seek an in-person evaluation if the symptoms worsen or if the condition fails to improve as anticipated.Cathlean Cower, MD

## 2018-08-16 NOTE — Assessment & Plan Note (Signed)
stable overall by history and exam, and pt to continue medical treatment as before,  to f/u any worsening symptoms or concerns 

## 2018-08-16 NOTE — Patient Instructions (Signed)
Please take all new medication as prescribed - the antibiotic, cough medicine, and prednisone  Please continue all other medications as before, and refills have been done if requested - the lorazepam  Please have the pharmacy call with any other refills you may need.  Please continue your efforts at being more active, low cholesterol diet, and weight control.  Please keep your appointments with your specialists as you may have planned

## 2018-08-16 NOTE — Assessment & Plan Note (Signed)
Mild to mod, for antibx course, predpac asd, cough med prn, to f/u any worsening symptoms or concerns

## 2018-08-16 NOTE — Assessment & Plan Note (Signed)
stable overall by history and exam, recent data reviewed with pt, and pt to continue medical treatment as before,  to f/u any worsening symptoms or concerns  

## 2018-08-29 ENCOUNTER — Telehealth: Payer: Self-pay | Admitting: Emergency Medicine

## 2018-08-29 NOTE — Telephone Encounter (Signed)
Left message for patient to call back  

## 2018-08-30 MED ORDER — TIOTROPIUM BROMIDE-OLODATEROL 2.5-2.5 MCG/ACT IN AERS: 2.0000 | INHALATION_SPRAY | Freq: Every day | RESPIRATORY_TRACT | 2 refills | Status: DC

## 2018-08-30 MED ORDER — PREDNISONE 10 MG PO TABS: ORAL_TABLET | ORAL | 0 refills | Status: DC

## 2018-08-30 NOTE — Telephone Encounter (Signed)
Called and spoke with patient regarding Beth's recommendations below Beth placed order for prednisone taper to pharmacy Advised pt that she can come by the office for a sample of Stiolot respimat Pt requested this inhaler be sent to pharmacy for pick up; she will not come to office due to [place] order to pharmacy today for inhaler stiolto Pt verbalized understanding Nothing further needed

## 2018-08-30 NOTE — Telephone Encounter (Signed)
LVM for patient to return call. X2

## 2018-08-30 NOTE — Telephone Encounter (Signed)
Will send in prednisone taper. Please have patient pick up sample of Stiolto- advise her to take 2 puffs once daily. Use nebulizer as needed only. Follow up with Dr. Lamonte Sakai next available (may need repeat PFTs and sleep study at later date).

## 2018-08-30 NOTE — Telephone Encounter (Signed)
Pt is calling back [phone]

## 2018-08-30 NOTE — Telephone Encounter (Signed)
Primary Pulmonologist: Dr Lamonte Sakai Last office visit and with whom: Dr Lamonte Sakai 01/03/18 What do we see them for (pulmonary problems): Chronic cough, emphysema Last OV assessment/plan:   Instructions   Please continue your zyrtec daily Start fluticasone nasal spray, 2 sprays each nostril daily Continue pantoprazole 40 mg daily as you have been taking it.  Take this medication 1 hour before eating Take prednisone 20mg  daily for 5 days and then stop.  Continue DuoNeb 4 times a day. Keep Ventolin available to use 2 puffs up to every 4 hours if needed for shortness of breath, cough, wheeze. We talked about possibly repeating your sleep study today.  Consider this and we can revisit, discuss further at your next visit Follow with Dr Lamonte Sakai in 2 months or sooner if you have any problems.       Was appointment offered to patient (explain)?  Patient wanted to wait for recommendations   Reason for call:   Patient stated her Duo neb is not helping her breathing.  Patient feels that it is not strong enough.  Patient stated "feels like there is not enough air getting into lungs". Patient stated she is using her Albuterol emergency inhaler Q4 hrs, and using duo nebs QID.  Patient is having a non productive cough, and sore throat, that she relates to cough.  Patient stated this started 1 month ago with no relief. Patient stated she has asthma, COPD, and allergies.  Message routed to [facility], NP to advise

## 2018-09-01 ENCOUNTER — Telehealth: Payer: Self-pay | Admitting: Cardiovascular Disease

## 2018-09-01 NOTE — Telephone Encounter (Signed)
Samples left at front desk 05/09/18 , never picked up per Claiborne Billings send back to refill department

## 2018-09-06 ENCOUNTER — Other Ambulatory Visit: Payer: Self-pay | Admitting: Cardiovascular Disease

## 2018-09-06 DIAGNOSIS — H52223 Regular astigmatism, bilateral: Secondary | ICD-10-CM | POA: Diagnosis not present

## 2018-09-21 DIAGNOSIS — N3941 Urge incontinence: Secondary | ICD-10-CM | POA: Diagnosis not present

## 2018-09-26 ENCOUNTER — Other Ambulatory Visit: Payer: Self-pay

## 2018-09-26 ENCOUNTER — Encounter: Payer: Self-pay | Admitting: Internal Medicine

## 2018-09-26 ENCOUNTER — Ambulatory Visit (INDEPENDENT_AMBULATORY_CARE_PROVIDER_SITE_OTHER): Payer: Medicare Other | Admitting: Internal Medicine

## 2018-09-26 VITALS — BP 112/80 | HR 120 | Temp 98.0°F | Ht 62.0 in | Wt 276.0 lb

## 2018-09-26 DIAGNOSIS — E785 Hyperlipidemia, unspecified: Secondary | ICD-10-CM

## 2018-09-26 DIAGNOSIS — E119 Type 2 diabetes mellitus without complications: Secondary | ICD-10-CM | POA: Diagnosis not present

## 2018-09-26 DIAGNOSIS — I10 Essential (primary) hypertension: Secondary | ICD-10-CM

## 2018-09-26 DIAGNOSIS — E611 Iron deficiency: Secondary | ICD-10-CM

## 2018-09-26 DIAGNOSIS — F411 Generalized anxiety disorder: Secondary | ICD-10-CM

## 2018-09-26 DIAGNOSIS — E039 Hypothyroidism, unspecified: Secondary | ICD-10-CM

## 2018-09-26 DIAGNOSIS — E538 Deficiency of other specified B group vitamins: Secondary | ICD-10-CM

## 2018-09-26 DIAGNOSIS — E559 Vitamin D deficiency, unspecified: Secondary | ICD-10-CM

## 2018-09-26 LAB — POCT GLYCOSYLATED HEMOGLOBIN (HGB A1C): Hemoglobin A1C: 8.1 % — AB (ref 4.0–5.6)

## 2018-09-26 MED ORDER — LEVOTHYROXINE SODIUM 25 MCG PO TABS: 25.0000 ug | ORAL_TABLET | Freq: Every day | ORAL | 3 refills | Status: DC

## 2018-09-26 MED ORDER — METFORMIN HCL 500 MG PO TABS: 500.0000 mg | ORAL_TABLET | Freq: Every day | ORAL | 3 refills | Status: DC

## 2018-09-26 MED ORDER — LORAZEPAM 1 MG PO TABS: ORAL_TABLET | ORAL | 2 refills | Status: DC

## 2018-09-26 MED ORDER — LANCETS MISC: 11 refills | Status: AC

## 2018-09-26 MED ORDER — FREESTYLE LITE DEVI: 0 refills | Status: AC

## 2018-09-26 MED ORDER — GLUCOSE BLOOD VI STRP: ORAL_STRIP | 12 refills | Status: AC

## 2018-09-26 NOTE — Progress Notes (Signed)
Subjective:    Patient ID: Andrea Perry, female    DOB: 03/22/1951, 68 y.o.   MRN: [id number]  HPI  Here to f/u; overall doing ok,  Pt denies chest pain, increasing sob or doe, wheezing, orthopnea, PND, increased LE swelling, palpitations, dizziness or syncope.  Pt denies new neurological symptoms such as new headache, or facial or extremity weakness or numbness.  Pt denies polydipsia, polyuria, or low sugar episode.  Pt states overall good compliance with meds, mostly trying to follow appropriate diet, with wt overall stable,  but little exercise however.  Has gained wt due to eating more than she exerts due to bilat knee pain. Wt Readings from Last 3 Encounters:  09/26/18 276 lb (125.2 kg)  05/18/18 253 lb (114.8 kg)  04/15/18 259 lb (117.5 kg)  Denies hyper or hypo thyroid symptoms such as voice, skin or hair change. Denies worsening depressive symptoms, suicidal ideation, or panic; has ongoing anxiety Past Medical History:  Diagnosis Date  . ALLERGIC RHINITIS 08/16/2008  . ANXIETY 08/16/2008  . Arthritis    hands, knees, lower back  . ASTHMA 08/16/2008  . Asthma    BRONCHITIS     DR. Lamonte Sakai    . Bladder leak   . Cancer ([place])    MELANOMA      . Chronic lower back pain    problems with disc L2-5  . COPD 08/16/2008  . DEPRESSION 08/16/2008  . Dysrhythmia    hx AF  . Excessive daytime sleepiness 06/19/2015  . GENITAL HERPES 12/03/2006  . GERD (gastroesophageal reflux disease)   . H/O hiatal hernia   . HEMATOCHEZIA 06/20/2009  . Hemorrhoids   . History of cardioversion 10/30/14  . History of kidney stones   . HYPERLIPIDEMIA 08/16/2008  . HYPERTENSION 12/03/2006  . Hypertension   . Impaired glucose tolerance 09/21/2010  . Overactive bladder 10/20/2015  . Pneumonia    as a child  . Pulmonary HTN ([place]) 06/19/2015  . Shortness of breath dyspnea   . Sleep apnea    MILD NO CPAP ORDERED  . Snoring 06/19/2015   Past Surgical History:  Procedure Laterality Date  . ABDOMINAL HYSTERECTOMY    .  APPENDECTOMY    . CARDIOVERSION N/A 03/20/2014   Procedure: CARDIOVERSION;  Surgeon: Josue Hector, MD;  Location: [facility] ENDOSCOPY;  Service: Cardiovascular;  Laterality: N/A;  . CARDIOVERSION N/A 10/30/2014   Procedure: CARDIOVERSION;  Surgeon: Josue Hector, MD;  Location: [facility] ENDOSCOPY;  Service: Cardiovascular;  Laterality: N/A;  . CARPAL TUNNEL RELEASE     Right + LEFT  . CESAREAN SECTION     x 1   . COLONOSCOPY  2004  . CYST EXCISION     RT ARM   . CYSTOCELE REPAIR N/A 10/25/2012   Procedure: ANTERIOR REPAIR (CYSTOCELE);  Surgeon: Gus Height, MD;  Location: [facility];  Service: Gynecology;  Laterality: N/A;  . HEEL SPUR SURGERY Bilateral   . KNEE SURGERY     Right + LEFT  . LUMBAR LAMINECTOMY/DECOMPRESSION MICRODISCECTOMY N/A 03/19/2016   Procedure: Laminectomy and Foraminotomy - Lumbar three-four - Lumbar four-five;  Surgeon: Eustace Moore, MD;  Location: [place];  Service: Neurosurgery;  Laterality: N/A;  . RADIOLOGY WITH ANESTHESIA Right 11/01/2014   Procedure: MRI RIGHT FOREARM;  Surgeon: Medication Radiologist, MD;  Location: [place];  Service: Radiology;  Laterality: Right;  . RADIOLOGY WITH ANESTHESIA Right 08/29/2015   Procedure: MRI - RIGHT FOREARM;  Surgeon: Medication Radiologist, MD;  Location: [place];  Service: Radiology;  Laterality: Right;  . RADIOLOGY WITH ANESTHESIA N/A 12/05/2015   Procedure: MRI SPINE WITHOUT;  Surgeon: Medication Radiologist, MD;  Location: [place];  Service: Radiology;  Laterality: N/A;  . SKIN CANCER EXCISION     BILAT SHOULDERS  . SPLIT NIGHT STUDY  09/04/2015  . TONSILLECTOMY AND ADENOIDECTOMY      reports that she quit smoking about 15 years ago. Her smoking use included cigarettes. She has a 45.00 pack-year smoking history. She has never used smokeless tobacco. She reports current alcohol use. She reports that she does not use drugs. family history includes Colon polyps in her mother; Diabetes in her mother; Hyperlipidemia in her mother; Hypertension in her  mother; Stroke in her father. Allergies  Allergen Reactions  . Tramadol Hcl Nausea And Vomiting and Other (See Comments)     mouth dryness, headache  . Codeine Nausea And Vomiting  . Tape Rash    Plastic tape, bandaids and ekg leads, causes redness and rash  . Vicodin [Hydrocodone-Acetaminophen] Nausea And Vomiting   Current Outpatient Medications on File Prior to Visit  Medication Sig Dispense Refill  . albuterol (PROVENTIL HFA;VENTOLIN HFA) 108 (90 Base) MCG/ACT inhaler Inhale 2 puffs by mouth every 6 hours as needed for wheezing or shortness of breath. **Yearly physical due in June** 1 Inhaler 11  . amLODipine (NORVASC) 10 MG tablet Take 1 tablet (10 mg total) by mouth daily. 90 tablet 3  . cetirizine (ZYRTEC) 10 MG tablet Take 10 mg by mouth daily.     . Cholecalciferol (VITAMIN D) 2000 units tablet Take 2,000 Units by mouth daily.    . Cyanocobalamin (VITAMELTS ENERGY VITAMIN B-12) 1500 MCG TBDP Take 1,500 mcg by mouth daily.    Marland Kitchen dimenhyDRINATE (DRAMAMINE) 50 MG tablet Take 50 mg by mouth daily as needed for nausea.    . diphenoxylate-atropine (LOMOTIL) 2.5-0.025 MG tablet Take 1 tablet by mouth 4 (four) times daily as needed for diarrhea or loose stools. 40 tablet 0  . ELIQUIS 5 MG TABS tablet TAKE 1 TABLET BY MOUTH TWICE DAILY 180 tablet 1  . escitalopram (LEXAPRO) 20 MG tablet Take 1 tablet (20 mg total) by mouth daily. 90 tablet 3  . estradiol (ESTRACE) 2 MG tablet Take 1 tablet (2 mg total) by mouth daily. 90 tablet 3  . flecainide (TAMBOCOR) 50 MG tablet Take 1 tablet (50 mg total) by mouth 2 (two) times daily. 180 tablet 3  . fluticasone (FLONASE) 50 MCG/ACT nasal spray Place 2 sprays into both nostrils daily. 16 g 2  . furosemide (LASIX) 40 MG tablet Take 1 tablet by mouth once daily 90 tablet 1  . Garlic 1610 MG CAPS Take 1,000 mg by mouth at bedtime.     Marland Kitchen guaiFENesin (MUCINEX) 600 MG 12 hr tablet Take 1 tablet (600 mg total) by mouth 2 (two) times daily as needed for cough  or to loosen phlegm. 60 tablet 2  . ipratropium-albuterol (DUONEB) 0.5-2.5 (3) MG/3ML SOLN USE 1 AMPULE IN NEBULIZER 4 TIMES DAILY 180 mL 2  . irbesartan (AVAPRO) 150 MG tablet TAKE 1 TABLET BY MOUTH ONCE DAILY 30 tablet 11  . meloxicam (MOBIC) 15 MG tablet Take one pill a day with food for 7 days and then prn thereafter 30 tablet 0  . metoprolol tartrate (LOPRESSOR) 50 MG tablet Take 1.5 tablets (75 mg total) by mouth 2 (two) times daily. 270 tablet 0  . nystatin (MYCOSTATIN/NYSTOP) powder Use as directed twice daily as needed 30 g  1  . Omega-3 Fatty Acids (FISH OIL) 1200 MG CAPS Take 1,200 mg by mouth daily.     . ondansetron (ZOFRAN) 4 MG tablet Take 1 tablet (4 mg total) by mouth every 8 (eight) hours as needed for nausea or vomiting. 30 tablet 1  . pantoprazole (PROTONIX) 40 MG tablet TAKE 1 TABLET BY MOUTH ONCE DAILY 90 tablet 1  . potassium chloride SA (K-DUR,KLOR-CON) 20 MEQ tablet Take 1 tablet (20 mEq total) by mouth daily. 90 tablet 3  . pravastatin (PRAVACHOL) 40 MG tablet TAKE 1 TABLET BY MOUTH ONCE DAILY 90 tablet 1  . Tetrahydrozoline HCl (VISINE OP) Place 1 drop into both eyes daily as needed (dry eyes).    . Tiotropium Bromide-Olodaterol (STIOLTO RESPIMAT) 2.5-2.5 MCG/ACT AERS Inhale 2 puffs into the lungs daily. 1 Inhaler 2  . tolterodine (DETROL LA) 4 MG 24 hr capsule Take 4 mg by mouth daily.    . vitamin E 400 UNIT capsule Take 400 Units by mouth daily.    Marland Kitchen zolpidem (AMBIEN) 5 MG tablet Take 1 tablet (5 mg total) by mouth at bedtime as needed for sleep. 90 tablet 1   No current facility-administered medications on file prior to visit.    Review of Systems  Constitutional: Negative for other unusual diaphoresis or sweats HENT: Negative for ear discharge or swelling Eyes: Negative for other worsening visual disturbances Respiratory: Negative for stridor or other swelling  Gastrointestinal: Negative for worsening distension or other blood Genitourinary: Negative for  retention or other urinary change Musculoskeletal: Negative for other MSK pain or swelling Skin: Negative for color change or other new lesions Neurological: Negative for worsening tremors and other numbness  Psychiatric/Behavioral: Negative for worsening agitation or other fatigue All other system neg per pt    Objective:   Physical Exam  BP 112/80 (BP Location: Left Arm, Patient Position: Sitting, Cuff Size: Large)   Pulse (!) 120   Temp 98 F (36.7 C) (Oral)   Ht 5\' 2"  (1.575 m)   Wt 276 lb (125.2 kg)   SpO2 95%   BMI 50.48 kg/m  VS noted,  Constitutional: Pt appears in NAD HENT: Head: NCAT.  Right Ear: External ear normal.  Left Ear: External ear normal.  Eyes: . Pupils are equal, round, and reactive to light. Conjunctivae and EOM are normal Nose: without d/c or deformity Neck: Neck supple. Gross normal ROM Cardiovascular: Normal rate and regular rhythm.   Pulmonary/Chest: Effort normal and breath sounds without rales or wheezing.  Abd:  Soft, NT, ND, + BS, no organomegaly Neurological: Pt is alert. At baseline orientation, motor grossly intact Skin: Skin is warm. No rashes, other new lesions, no LE edema Psychiatric: Pt behavior is normal without agitation  No other exam findings  Lab Results  Component Value Date   WBC 11.3 (H) 03/23/2018   HGB 14.8 03/23/2018   HCT 45.4 03/23/2018   PLT 277.0 03/23/2018   GLUCOSE 143 (H) 03/23/2018   CHOL 163 03/23/2018   TRIG 168.0 (H) 03/23/2018   HDL 41.90 03/23/2018   LDLDIRECT 141.0 10/16/2015   LDLCALC 87 03/23/2018   ALT 15 03/23/2018   AST 14 03/23/2018   NA 140 03/23/2018   K 4.0 03/23/2018   CL 106 03/23/2018   CREATININE 0.90 03/23/2018   BUN 16 03/23/2018   CO2 24 03/23/2018   TSH 7.12 (H) 03/23/2018   INR 1.00 03/18/2016   HGBA1C 6.6 (H) 03/23/2018   MICROALBUR 96.6 (H) 03/17/2017   POCT HgB  A1C  Order: [id number]  Status:  Final result Visible to patient:  No (Not Released) Dx:  Type 2 diabetes  mellitus without comp...   Ref Range & Units 15:04 (09/26/18) 82mo ago (03/23/18) 42yr ago (09/15/17) 65yr ago (03/17/17) 25yr ago (09/11/16) 64yr ago (04/16/16) 24yr ago (10/16/15)  Hemoglobin A1C 4.0 - 5.6 % 8.1Abnormal   6.6High  R, CM 6.3 R, CM 6.8High  R, CM 6.0 R 6.2 R 6.2 R,            Assessment & Plan:  Free style

## 2018-09-26 NOTE — Assessment & Plan Note (Signed)
stable overall by history and exam, recent data reviewed with pt, and pt to continue medical treatment as before,  to f/u any worsening symptoms or concerns  

## 2018-09-26 NOTE — Assessment & Plan Note (Signed)
Mild uncontrolled, start metformin 500 qam,  to f/u any worsening symptoms or concerns

## 2018-09-26 NOTE — Assessment & Plan Note (Signed)
Mild, for low dose levthyroxin 25 mcg qd, fu lab next visit

## 2018-09-26 NOTE — Patient Instructions (Addendum)
Your A1c was elevated today above 7; Please take all new medication as prescribed - the metformin 500 mg in the AM  Please take all new medication as prescribed - the low dose of thyroid medication  Please continue all other medications as before, and refills have been done if requested - the ativan  Please have the pharmacy call with any other refills you may need.  Please continue your efforts at being more active, low cholesterol diet, and weight control.  Please keep your appointments with your specialists as you may have planned  Please return in 6 months, or sooner if needed, with Lab testing done 3-5 days before  .

## 2018-10-07 ENCOUNTER — Other Ambulatory Visit: Payer: Self-pay | Admitting: Emergency Medicine

## 2018-10-07 DIAGNOSIS — R05 Cough: Secondary | ICD-10-CM

## 2018-10-07 DIAGNOSIS — R053 Chronic cough: Secondary | ICD-10-CM

## 2018-10-10 ENCOUNTER — Other Ambulatory Visit: Payer: Self-pay | Admitting: Cardiovascular Disease

## 2018-10-10 ENCOUNTER — Telehealth: Payer: Self-pay

## 2018-10-10 NOTE — Telephone Encounter (Signed)

## 2018-10-16 ENCOUNTER — Other Ambulatory Visit: Payer: Self-pay | Admitting: Internal Medicine

## 2018-10-16 ENCOUNTER — Other Ambulatory Visit: Payer: Self-pay | Admitting: Cardiovascular Disease

## 2018-10-17 NOTE — Progress Notes (Signed)
Virtual Visit via Telephone Note   This visit type was conducted due to national recommendations for restrictions regarding the COVID-19 Pandemic (e.g. social distancing) in an effort to limit this patient's exposure and mitigate transmission in our community.  Due to her co-morbid illnesses, this patient is at least at moderate risk for complications without adequate follow up.  This format is felt to be most appropriate for this patient at this time.  The patient did not have access to video technology/had technical difficulties with video requiring transitioning to audio format only (telephone).  All issues noted in this document were discussed and addressed.  No physical exam could be performed with this format.  Please refer to the patient's chart for her  consent to telehealth for [facility].   Date:  10/19/2018   ID:  Andrea Perry, DOB 1950-08-16, MRN [id number]  Patient Location: Home Provider Location: Office  PCP:  Biagio Borg, MD  Cardiologist:   Johnsie Cancel Electrophysiologist:  None   Evaluation Performed:  Follow-Up Visit  Chief Complaint:  PAF  History of Present Illness:    68 y.o. with history of diastolic dysfunction, DM, HTN, HLD. Started on Glucophage by Dr Jenny Reichmann in June A1c was elevated over 8.1  She has had Long standing PAF Tends to retain fluid when she is in afib. Was supposed to have [facility] in June 2019 but indicated she could not afford eliquis and cancelled procedure No history of CAD last normal myovue was 2016  Echo 10/05/17 EF normal LA 42 mm  Watching diet better no cardiac complaints Thinks she's beating normal  No bleeding issues now compliant with eliquis   The patient  does not have symptoms concerning for COVID-19 infection (fever, chills, cough, or new shortness of breath).    Past Medical History:  Diagnosis Date  . ALLERGIC RHINITIS 08/16/2008  . ANXIETY 08/16/2008  . Arthritis    hands, knees, lower back  . ASTHMA 08/16/2008  . Asthma    BRONCHITIS     DR. Lamonte Sakai    . Bladder leak   . Cancer ([place])    MELANOMA      . Chronic lower back pain    problems with disc L2-5  . COPD 08/16/2008  . DEPRESSION 08/16/2008  . Dysrhythmia    hx AF  . Excessive daytime sleepiness 06/19/2015  . GENITAL HERPES 12/03/2006  . GERD (gastroesophageal reflux disease)   . H/O hiatal hernia   . HEMATOCHEZIA 06/20/2009  . Hemorrhoids   . History of cardioversion 10/30/14  . History of kidney stones   . HYPERLIPIDEMIA 08/16/2008  . HYPERTENSION 12/03/2006  . Hypertension   . Impaired glucose tolerance 09/21/2010  . Overactive bladder 10/20/2015  . Pneumonia    as a child  . Pulmonary HTN ([place]) 06/19/2015  . Shortness of breath dyspnea   . Sleep apnea    MILD NO CPAP ORDERED  . Snoring 06/19/2015   Past Surgical History:  Procedure Laterality Date  . ABDOMINAL HYSTERECTOMY    . APPENDECTOMY    . CARDIOVERSION N/A 03/20/2014   Procedure: CARDIOVERSION;  Surgeon: Josue Hector, MD;  Location: [facility] ENDOSCOPY;  Service: Cardiovascular;  Laterality: N/A;  . CARDIOVERSION N/A 10/30/2014   Procedure: CARDIOVERSION;  Surgeon: Josue Hector, MD;  Location: [facility] ENDOSCOPY;  Service: Cardiovascular;  Laterality: N/A;  . CARPAL TUNNEL RELEASE     Right + LEFT  . CESAREAN SECTION     x 1   . COLONOSCOPY  2004  . CYST EXCISION     RT ARM   . CYSTOCELE REPAIR N/A 10/25/2012   Procedure: ANTERIOR REPAIR (CYSTOCELE);  Surgeon: Gus Height, MD;  Location: [place] [facility];  Service: Gynecology;  Laterality: N/A;  . HEEL SPUR SURGERY Bilateral   . KNEE SURGERY     Right + LEFT  . LUMBAR LAMINECTOMY/DECOMPRESSION MICRODISCECTOMY N/A 03/19/2016   Procedure: Laminectomy and Foraminotomy - Lumbar three-four - Lumbar four-five;  Surgeon: Eustace Moore, MD;  Location: [place];  Service: Neurosurgery;  Laterality: N/A;  . RADIOLOGY WITH ANESTHESIA Right 11/01/2014   Procedure: MRI RIGHT FOREARM;  Surgeon: Medication Radiologist, MD;  Location: [place];  Service: Radiology;  Laterality:  Right;  . RADIOLOGY WITH ANESTHESIA Right 08/29/2015   Procedure: MRI - RIGHT FOREARM;  Surgeon: Medication Radiologist, MD;  Location: [place];  Service: Radiology;  Laterality: Right;  . RADIOLOGY WITH ANESTHESIA N/A 12/05/2015   Procedure: MRI SPINE WITHOUT;  Surgeon: Medication Radiologist, MD;  Location: [place];  Service: Radiology;  Laterality: N/A;  . SKIN CANCER EXCISION     BILAT SHOULDERS  . SPLIT NIGHT STUDY  09/04/2015  . TONSILLECTOMY AND ADENOIDECTOMY       Current Meds  Medication Sig  . albuterol (PROVENTIL HFA;VENTOLIN HFA) 108 (90 Base) MCG/ACT inhaler Inhale 2 puffs by mouth every 6 hours as needed for wheezing or shortness of breath. **Yearly physical due in June**  . amLODipine (NORVASC) 10 MG tablet Take 1 tablet by mouth once daily  . Blood Glucose Monitoring Suppl (FREESTYLE LITE) DEVI Use as directed daily E11.9  . cetirizine (ZYRTEC) 10 MG tablet Take 10 mg by mouth daily.   . Cholecalciferol (VITAMIN D) 2000 units tablet Take 2,000 Units by mouth daily.  . Cyanocobalamin (VITAMELTS ENERGY VITAMIN B-12) 1500 MCG TBDP Take 1,500 mcg by mouth daily.  Marland Kitchen dimenhyDRINATE (DRAMAMINE) 50 MG tablet Take 50 mg by mouth daily as needed for nausea.  . diphenoxylate-atropine (LOMOTIL) 2.5-0.025 MG tablet Take 1 tablet by mouth 4 (four) times daily as needed for diarrhea or loose stools.  Marland Kitchen ELIQUIS 5 MG TABS tablet TAKE 1 TABLET BY MOUTH TWICE DAILY  . escitalopram (LEXAPRO) 20 MG tablet Take 1 tablet (20 mg total) by mouth daily.  Marland Kitchen estradiol (ESTRACE) 2 MG tablet Take 1 tablet (2 mg total) by mouth daily.  . flecainide (TAMBOCOR) 50 MG tablet Take 1 tablet (50 mg total) by mouth 2 (two) times daily. Please keep upcoming appt for future refills. Thank you  . fluticasone (FLONASE) 50 MCG/ACT nasal spray Place 2 sprays into both nostrils daily.  . furosemide (LASIX) 40 MG tablet Take 1 tablet by mouth once daily  . Garlic 7619 MG CAPS Take 1,000 mg by mouth at bedtime.   Marland Kitchen glucose  blood (FREESTYLE LITE) test strip Use as instructed once daily E11.9  . guaiFENesin (MUCINEX) 600 MG 12 hr tablet Take 1 tablet (600 mg total) by mouth 2 (two) times daily as needed for cough or to loosen phlegm.  Marland Kitchen ipratropium-albuterol (DUONEB) 0.5-2.5 (3) MG/3ML SOLN USE 1 AMPULE IN NEBULIZER 4 TIMES DAILY  . irbesartan (AVAPRO) 150 MG tablet TAKE 1 TABLET BY MOUTH ONCE DAILY  . Lancets MISC Use as directed once daily E11.9  . levothyroxine (SYNTHROID) 25 MCG tablet Take 1 tablet (25 mcg total) by mouth daily before breakfast.  . LORazepam (ATIVAN) 1 MG tablet Take 1 tablet by mouth twice per day as needed for anxiety.  . meloxicam (MOBIC) 15 MG  tablet Take one pill a day with food for 7 days and then prn thereafter  . metFORMIN (GLUCOPHAGE) 500 MG tablet Take 1 tablet (500 mg total) by mouth daily with breakfast.  . metoprolol tartrate (LOPRESSOR) 50 MG tablet Take 1.5 tablets (75 mg total) by mouth 2 (two) times daily.  Marland Kitchen nystatin (MYCOSTATIN/NYSTOP) powder Use as directed twice daily as needed  . Omega-3 Fatty Acids (FISH OIL) 1200 MG CAPS Take 1,200 mg by mouth daily.   . ondansetron (ZOFRAN) 4 MG tablet Take 1 tablet (4 mg total) by mouth every 8 (eight) hours as needed for nausea or vomiting.  . pantoprazole (PROTONIX) 40 MG tablet Take 1 tablet by mouth once daily  . potassium chloride SA (K-DUR,KLOR-CON) 20 MEQ tablet Take 1 tablet (20 mEq total) by mouth daily.  . pravastatin (PRAVACHOL) 40 MG tablet TAKE 1 TABLET BY MOUTH ONCE DAILY  . Tetrahydrozoline HCl (VISINE OP) Place 1 drop into both eyes daily as needed (dry eyes).  . Tiotropium Bromide-Olodaterol (STIOLTO RESPIMAT) 2.5-2.5 MCG/ACT AERS Inhale 2 puffs into the lungs daily.  Marland Kitchen tolterodine (DETROL LA) 4 MG 24 hr capsule Take 4 mg by mouth daily.  . vitamin E 400 UNIT capsule Take 400 Units by mouth daily.  Marland Kitchen zolpidem (AMBIEN) 5 MG tablet Take 1 tablet (5 mg total) by mouth at bedtime as needed for sleep.     Allergies:    Tramadol hcl, Codeine, Tape, and Vicodin [hydrocodone-acetaminophen]   Social History   Tobacco Use  . Smoking status: Former Smoker    Packs/day: 1.50    Years: 30.00    Pack years: 45.00    Types: Cigarettes    Quit date: 04/21/2003    Years since quitting: 15.5  . Smokeless tobacco: Never Used  Substance Use Topics  . Alcohol use: Yes    Comment: OCCAS  . Drug use: No     Family Hx: The patient's family history includes Colon polyps in her mother; Diabetes in her mother; Hyperlipidemia in her mother; Hypertension in her mother; Stroke in her father. There is no history of Colon cancer.  ROS:   Please see the history of present illness.     All other systems reviewed and are negative.   Prior CV studies:   The following studies were reviewed today:  Echo 10/05/17 Myovue 2016  Labs/Other Tests and Data Reviewed:    EKG:  Not performed today   Recent Labs: 03/23/2018: ALT 15; BUN 16; Creatinine, Ser 0.90; Hemoglobin 14.8; Platelets 277.0; Potassium 4.0; Sodium 140; TSH 7.12   Recent Lipid Panel Lab Results  Component Value Date/Time   CHOL 163 03/23/2018 03:21 PM   TRIG 168.0 (H) 03/23/2018 03:21 PM   HDL 41.90 03/23/2018 03:21 PM   CHOLHDL 4 03/23/2018 03:21 PM   LDLCALC 87 03/23/2018 03:21 PM   LDLDIRECT 141.0 10/16/2015 02:37 PM    Wt Readings from Last 3 Encounters:  10/19/18 250 lb (113.4 kg)  09/26/18 276 lb (125.2 kg)  05/18/18 253 lb (114.8 kg)     Objective:    Vital Signs:  BP 116/80   Ht 5\' 2"  (1.575 m)   Wt 250 lb (113.4 kg)   BMI 45.73 kg/m    Telephone visit no exam   ASSESSMENT & PLAN:    PAF:  Seems to be in sinus no symptoms now compliant with eliquis f/u with me in 3 months in person with ECG  HTN:  Well controlled.  Continue current medications and  low sodium Dash type diet.   Thyroid:  Started on low dose synthroid f/u TSH Dr Jenny Reichmann HLD:  On statin labs with pirmary  DM:  Discussed low carb diet.  Target hemoglobin A1c is 6.5 or  less.  Continue current medications. Diastolic Dysfunction: does better in NSR continue lasix    COVID-19 Education: The signs and symptoms of COVID-19 were discussed with the patient and how to seek care for testing (follow up with PCP or arrange E-visit).  The importance of social distancing was discussed today.  Time:   Today, I have spent 30 minutes with the patient with telehealth technology discussing the above problems.     Medication Adjustments/Labs and Tests Ordered: Current medicines are reviewed at length with the patient today.  Concerns regarding medicines are outlined above.   Tests Ordered:  ECG   Medication Changes:  None   Disposition:  Follow up in 3 months with ECG  Signed, Jenkins Rouge, MD  10/19/2018 10:57 AM    [place]

## 2018-10-19 ENCOUNTER — Telehealth (INDEPENDENT_AMBULATORY_CARE_PROVIDER_SITE_OTHER): Payer: Medicare Other | Admitting: Cardiovascular Disease

## 2018-10-19 ENCOUNTER — Other Ambulatory Visit: Payer: Self-pay

## 2018-10-19 ENCOUNTER — Encounter: Payer: Self-pay | Admitting: Cardiovascular Disease

## 2018-10-19 VITALS — BP 116/80 | Ht 62.0 in | Wt 250.0 lb

## 2018-10-19 DIAGNOSIS — I480 Paroxysmal atrial fibrillation: Secondary | ICD-10-CM

## 2018-10-19 NOTE — Patient Instructions (Signed)
Medication Instructions:  Your physician recommends that you continue on your current medications as directed. Please refer to the Current Medication list given to you today.  If you need a refill on your cardiac medications before your next appointment, please call your pharmacy.    Lab work: None Ordered    Testing/Procedures: None Ordered   Follow-Up: At [facility], you and your health needs are our priority.  As part of our continuing mission to provide you with exceptional heart care, we have created designated Provider Care Teams.  These Care Teams include your primary Cardiologist (physician) and Advanced Practice Providers (APPs -  Physician Assistants and Nurse Practitioners) who all work together to provide you with the care you need, when you need it. You will need a follow up appointment in 3 months in the office with EKG.   You may see Dr. Johnsie Cancel or one of the following Advanced Practice Providers on your designated Care Team:   Truitt Merle, NP Cecilie Kicks, NP . Kathyrn Drown, NP

## 2018-11-01 ENCOUNTER — Other Ambulatory Visit: Payer: Self-pay | Admitting: Internal Medicine

## 2018-11-12 ENCOUNTER — Other Ambulatory Visit: Payer: Self-pay | Admitting: Emergency Medicine

## 2018-11-12 DIAGNOSIS — R053 Chronic cough: Secondary | ICD-10-CM

## 2018-11-12 DIAGNOSIS — R05 Cough: Secondary | ICD-10-CM

## 2018-11-14 ENCOUNTER — Telehealth: Payer: Self-pay | Admitting: Emergency Medicine

## 2018-11-14 DIAGNOSIS — R05 Cough: Secondary | ICD-10-CM

## 2018-11-14 DIAGNOSIS — R053 Chronic cough: Secondary | ICD-10-CM

## 2018-11-14 MED ORDER — IPRATROPIUM-ALBUTEROL 0.5-2.5 (3) MG/3ML IN SOLN: 3.0000 mL | Freq: Four times a day (QID) | RESPIRATORY_TRACT | 0 refills | Status: DC

## 2018-11-14 NOTE — Telephone Encounter (Signed)
Call returned to patient. Requesting refill of Duoneb. Confirmed medication and pharmacy. Refill sent. Nothing further needed at this time.

## 2018-11-15 ENCOUNTER — Ambulatory Visit: Payer: Medicare Other | Admitting: Pulmonary Disease

## 2018-11-15 ENCOUNTER — Ambulatory Visit: Payer: Medicare Other | Admitting: Emergency Medicine

## 2018-11-15 NOTE — Progress Notes (Deleted)
@Patient  ID: Andrea Perry, female    DOB: [birth year], 68 y.o.   MRN: [id number]  No chief complaint on file.   Referring provider: Biagio Borg, MD  HPI:  68 year old female former smoker followed in our office for COPD with asthmatic component, history of cotton dust exposure, mild obstructive sleep apnea not currently managed on CPAP  PMH: Allergic rhinitis, hypothyroidism, dyspepsia, low back pain, abdominal pain, diabetes, IBS, hypertension, depression, anxiety Smoker/ Smoking History: Former smoker Maintenance:   Pt of: Dr. Lamonte Sakai  11/15/2018  - Visit   HPI  Tests:   04/30/2018-chest x-ray-no acute cardiopulmonary process, left basilar atelectasis  10/05/2017-echocardiogram-study was not technically sufficient to allow evaluation of LV diastolic dysfunction due to A. fib, pulmonary artery systolic pressure was within normal range  09/04/2015-split-night sleep study- AHI 4.9  01/11/2014-spirometry- FVC 2.3 (83% predicted), ratio 56, FEV1 1.3 (59% predicted)  FENO:  No results found for: NITRICOXIDE  PFT: No flowsheet data found.  Imaging: No results found.    Specialty Problems      Pulmonary Problems   Allergic rhinitis    Qualifier: Diagnosis of  By: Jenny Reichmann MD, Hunt Oris       COPD (chronic obstructive pulmonary disease) ([place])    Qualifier: Diagnosis of  By: Jenny Reichmann MD, Hunt Oris       Cough   Mild obstructive sleep apnea   Snoring   COPD exacerbation ([place])      Allergies  Allergen Reactions  . Tramadol Hcl Nausea And Vomiting and Other (See Comments)     mouth dryness, headache  . Codeine Nausea And Vomiting and Nausea Only  . Tape Rash    Plastic tape, bandaids and ekg leads, causes redness and rash  . Vicodin [Hydrocodone-Acetaminophen] Nausea And Vomiting    Immunization History  Administered Date(s) Administered  . Influenza, High Dose Seasonal PF 03/23/2018  . Influenza,inj,Quad PF,6+ Mos 03/01/2013, 02/14/2014, 03/08/2015, 04/16/2016,  01/12/2017  . Influenza-Unspecified 01/18/2017  . Pneumococcal Conjugate-13 03/30/2013  . Pneumococcal Polysaccharide-23 04/04/2014  . Tdap 09/24/2010    Past Medical History:  Diagnosis Date  . ALLERGIC RHINITIS 08/16/2008  . ANXIETY 08/16/2008  . Arthritis    hands, knees, lower back  . ASTHMA 08/16/2008  . Asthma    BRONCHITIS     DR. Lamonte Sakai    . Bladder leak   . Cancer ([place])    MELANOMA      . Chronic lower back pain    problems with disc L2-5  . COPD 08/16/2008  . DEPRESSION 08/16/2008  . Dysrhythmia    hx AF  . Excessive daytime sleepiness 06/19/2015  . GENITAL HERPES 12/03/2006  . GERD (gastroesophageal reflux disease)   . H/O hiatal hernia   . HEMATOCHEZIA 06/20/2009  . Hemorrhoids   . History of cardioversion 10/30/14  . History of kidney stones   . HYPERLIPIDEMIA 08/16/2008  . HYPERTENSION 12/03/2006  . Hypertension   . Impaired glucose tolerance 09/21/2010  . Overactive bladder 10/20/2015  . Pneumonia    as a child  . Pulmonary HTN ([place]) 06/19/2015  . Shortness of breath dyspnea   . Sleep apnea    MILD NO CPAP ORDERED  . Snoring 06/19/2015    Tobacco History: Social History   Tobacco Use  Smoking Status Former Smoker  . Packs/day: 1.50  . Years: 30.00  . Pack years: 45.00  . Types: Cigarettes  . Quit date: 04/21/2003  . Years since quitting: 15.5  Smokeless Tobacco Never Used  Counseling given: Not Answered   Continue to not smoke  Outpatient Encounter Medications as of 11/17/2018  Medication Sig  . albuterol (PROVENTIL HFA;VENTOLIN HFA) 108 (90 Base) MCG/ACT inhaler Inhale 2 puffs by mouth every 6 hours as needed for wheezing or shortness of breath. **Yearly physical due in June**  . amLODipine (NORVASC) 10 MG tablet Take 1 tablet by mouth once daily  . Blood Glucose Monitoring Suppl (FREESTYLE LITE) DEVI Use as directed daily E11.9  . cetirizine (ZYRTEC) 10 MG tablet Take 10 mg by mouth daily.   . Cholecalciferol (VITAMIN D) 2000 units tablet Take 2,000  Units by mouth daily.  . Cyanocobalamin (VITAMELTS ENERGY VITAMIN B-12) 1500 MCG TBDP Take 1,500 mcg by mouth daily.  Marland Kitchen dimenhyDRINATE (DRAMAMINE) 50 MG tablet Take 50 mg by mouth daily as needed for nausea.  . diphenoxylate-atropine (LOMOTIL) 2.5-0.025 MG tablet Take 1 tablet by mouth 4 (four) times daily as needed for diarrhea or loose stools.  Marland Kitchen ELIQUIS 5 MG TABS tablet TAKE 1 TABLET BY MOUTH TWICE DAILY  . escitalopram (LEXAPRO) 20 MG tablet Take 1 tablet (20 mg total) by mouth daily.  Marland Kitchen estradiol (ESTRACE) 2 MG tablet Take 1 tablet (2 mg total) by mouth daily.  . flecainide (TAMBOCOR) 50 MG tablet Take 1 tablet (50 mg total) by mouth 2 (two) times daily. Please keep upcoming appt for future refills. Thank you  . fluticasone (FLONASE) 50 MCG/ACT nasal spray Place 2 sprays into both nostrils daily.  . furosemide (LASIX) 40 MG tablet Take 1 tablet by mouth once daily  . Garlic 9563 MG CAPS Take 1,000 mg by mouth at bedtime.   Marland Kitchen glucose blood (FREESTYLE LITE) test strip Use as instructed once daily E11.9  . guaiFENesin (MUCINEX) 600 MG 12 hr tablet Take 1 tablet (600 mg total) by mouth 2 (two) times daily as needed for cough or to loosen phlegm.  Marland Kitchen ipratropium-albuterol (DUONEB) 0.5-2.5 (3) MG/3ML SOLN Inhale 3 mLs into the lungs 4 (four) times daily.  . irbesartan (AVAPRO) 150 MG tablet TAKE 1 TABLET BY MOUTH ONCE DAILY  . Lancets MISC Use as directed once daily E11.9  . levothyroxine (SYNTHROID) 25 MCG tablet Take 1 tablet (25 mcg total) by mouth daily before breakfast.  . LORazepam (ATIVAN) 1 MG tablet Take 1 tablet by mouth twice per day as needed for anxiety.  . meloxicam (MOBIC) 15 MG tablet Take one pill a day with food for 7 days and then prn thereafter  . metFORMIN (GLUCOPHAGE) 500 MG tablet Take 1 tablet (500 mg total) by mouth daily with breakfast.  . metoprolol tartrate (LOPRESSOR) 50 MG tablet Take 1.5 tablets (75 mg total) by mouth 2 (two) times daily.  Marland Kitchen nystatin  (MYCOSTATIN/NYSTOP) powder Use as directed twice daily as needed  . Omega-3 Fatty Acids (FISH OIL) 1200 MG CAPS Take 1,200 mg by mouth daily.   . ondansetron (ZOFRAN) 4 MG tablet Take 1 tablet (4 mg total) by mouth every 8 (eight) hours as needed for nausea or vomiting.  . pantoprazole (PROTONIX) 40 MG tablet Take 1 tablet by mouth once daily  . potassium chloride SA (K-DUR,KLOR-CON) 20 MEQ tablet Take 1 tablet (20 mEq total) by mouth daily.  . pravastatin (PRAVACHOL) 40 MG tablet Take 1 tablet by mouth once daily  . Tetrahydrozoline HCl (VISINE OP) Place 1 drop into both eyes daily as needed (dry eyes).  . Tiotropium Bromide-Olodaterol (STIOLTO RESPIMAT) 2.5-2.5 MCG/ACT AERS Inhale 2 puffs into the lungs daily.  Marland Kitchen tolterodine (  DETROL LA) 4 MG 24 hr capsule Take 4 mg by mouth daily.  . vitamin E 400 UNIT capsule Take 400 Units by mouth daily.  Marland Kitchen zolpidem (AMBIEN) 5 MG tablet Take 1 tablet (5 mg total) by mouth at bedtime as needed for sleep.   No facility-administered encounter medications on file as of 11/17/2018.      Review of Systems  Review of Systems   Physical Exam  There were no vitals taken for this visit.  Wt Readings from Last 5 Encounters:  10/19/18 250 lb (113.4 kg)  09/26/18 276 lb (125.2 kg)  05/18/18 253 lb (114.8 kg)  04/15/18 259 lb (117.5 kg)  03/23/18 247 lb (112 kg)     Physical Exam   Lab Results:  CBC    Component Value Date/Time   WBC 11.3 (H) 03/23/2018 1521   RBC 4.86 03/23/2018 1521   HGB 14.8 03/23/2018 1521   HCT 45.4 03/23/2018 1521   PLT 277.0 03/23/2018 1521   MCV 93.4 03/23/2018 1521   MCH 31.2 03/18/2016 1026   MCHC 32.6 03/23/2018 1521   RDW 14.5 03/23/2018 1521   LYMPHSABS 3.6 03/23/2018 1521   MONOABS 1.0 03/23/2018 1521   EOSABS 0.2 03/23/2018 1521   BASOSABS 0.1 03/23/2018 1521    BMET    Component Value Date/Time   NA 140 03/23/2018 1521   NA 143 09/08/2016 1427   K 4.0 03/23/2018 1521   CL 106 03/23/2018 1521   CO2  24 03/23/2018 1521   GLUCOSE 143 (H) 03/23/2018 1521   BUN 16 03/23/2018 1521   BUN 11 09/08/2016 1427   CREATININE 0.90 03/23/2018 1521   CALCIUM 9.0 03/23/2018 1521   GFRNONAA 97 09/08/2016 1427   GFRAA 112 09/08/2016 1427    BNP No results found for: BNP  ProBNP    Component Value Date/Time   PROBNP 919 (H) 09/08/2016 1427      Assessment & Plan:   No problem-specific Assessment & Plan notes found for this encounter.    No follow-ups on file.   Lauraine Rinne, NP 11/15/2018   This appointment was *** minutes long with over 50% of the time in direct face-to-face patient care, assessment, plan of care, and follow-up.

## 2018-11-15 NOTE — Progress Notes (Deleted)
@Patient  ID: Andrea Perry, female    DOB: 09/09/1950, 68 y.o.   MRN: [id number]  No chief complaint on file.   Referring provider: Biagio Borg, MD  HPI:  68 year old female former smoker followed in our office for COPD with asthmatic component, history of cotton dust exposure, mild obstructive sleep apnea not currently managed on CPAP  PMH: Allergic rhinitis, hypothyroidism, dyspepsia, low back pain, abdominal pain, diabetes, IBS, hypertension, depression, anxiety Smoker/ Smoking History: Former smoker Maintenance:   Pt of: Dr. Lamonte Sakai  11/15/2018  - Visit   HPI  Tests:   04/30/2018-chest x-ray-no acute cardiopulmonary process, left basilar atelectasis  10/05/2017-echocardiogram-study was not technically sufficient to allow evaluation of LV diastolic dysfunction due to A. fib, pulmonary artery systolic pressure was within normal range  09/04/2015-split-night sleep study- AHI 4.9  01/11/2014-spirometry- FVC 2.3 (83% predicted), ratio 56, FEV1 1.3 (59% predicted)  FENO:  No results found for: NITRICOXIDE  PFT: No flowsheet data found.  Imaging: No results found.    Specialty Problems      Pulmonary Problems   Allergic rhinitis    Qualifier: Diagnosis of  By: Jenny Reichmann MD, Hunt Oris       COPD (chronic obstructive pulmonary disease) ([place])    Qualifier: Diagnosis of  By: Jenny Reichmann MD, Hunt Oris       Cough   Mild obstructive sleep apnea   Snoring   COPD exacerbation ([place])      Allergies  Allergen Reactions  . Tramadol Hcl Nausea And Vomiting and Other (See Comments)     mouth dryness, headache  . Codeine Nausea And Vomiting and Nausea Only  . Tape Rash    Plastic tape, bandaids and ekg leads, causes redness and rash  . Vicodin [Hydrocodone-Acetaminophen] Nausea And Vomiting    Immunization History  Administered Date(s) Administered  . Influenza, High Dose Seasonal PF 03/23/2018  . Influenza,inj,Quad PF,6+ Mos 03/01/2013, 02/14/2014, 03/08/2015, 04/16/2016,  01/12/2017  . Influenza-Unspecified 01/18/2017  . Pneumococcal Conjugate-13 03/30/2013  . Pneumococcal Polysaccharide-23 04/04/2014  . Tdap 09/24/2010    Past Medical History:  Diagnosis Date  . ALLERGIC RHINITIS 08/16/2008  . ANXIETY 08/16/2008  . Arthritis    hands, knees, lower back  . ASTHMA 08/16/2008  . Asthma    BRONCHITIS     DR. Lamonte Sakai    . Bladder leak   . Cancer ([place])    MELANOMA      . Chronic lower back pain    problems with disc L2-5  . COPD 08/16/2008  . DEPRESSION 08/16/2008  . Dysrhythmia    hx AF  . Excessive daytime sleepiness 06/19/2015  . GENITAL HERPES 12/03/2006  . GERD (gastroesophageal reflux disease)   . H/O hiatal hernia   . HEMATOCHEZIA 06/20/2009  . Hemorrhoids   . History of cardioversion 10/30/14  . History of kidney stones   . HYPERLIPIDEMIA 08/16/2008  . HYPERTENSION 12/03/2006  . Hypertension   . Impaired glucose tolerance 09/21/2010  . Overactive bladder 10/20/2015  . Pneumonia    as a child  . Pulmonary HTN ([place]) 06/19/2015  . Shortness of breath dyspnea   . Sleep apnea    MILD NO CPAP ORDERED  . Snoring 06/19/2015    Tobacco History: Social History   Tobacco Use  Smoking Status Former Smoker  . Packs/day: 1.50  . Years: 30.00  . Pack years: 45.00  . Types: Cigarettes  . Quit date: 04/21/2003  . Years since quitting: 15.5  Smokeless Tobacco Never Used  Counseling given: Not Answered   Continue to not smoke  Outpatient Encounter Medications as of 11/15/2018  Medication Sig  . albuterol (PROVENTIL HFA;VENTOLIN HFA) 108 (90 Base) MCG/ACT inhaler Inhale 2 puffs by mouth every 6 hours as needed for wheezing or shortness of breath. **Yearly physical due in June**  . amLODipine (NORVASC) 10 MG tablet Take 1 tablet by mouth once daily  . Blood Glucose Monitoring Suppl (FREESTYLE LITE) DEVI Use as directed daily E11.9  . cetirizine (ZYRTEC) 10 MG tablet Take 10 mg by mouth daily.   . Cholecalciferol (VITAMIN D) 2000 units tablet Take 2,000  Units by mouth daily.  . Cyanocobalamin (VITAMELTS ENERGY VITAMIN B-12) 1500 MCG TBDP Take 1,500 mcg by mouth daily.  Marland Kitchen dimenhyDRINATE (DRAMAMINE) 50 MG tablet Take 50 mg by mouth daily as needed for nausea.  . diphenoxylate-atropine (LOMOTIL) 2.5-0.025 MG tablet Take 1 tablet by mouth 4 (four) times daily as needed for diarrhea or loose stools.  Marland Kitchen ELIQUIS 5 MG TABS tablet TAKE 1 TABLET BY MOUTH TWICE DAILY  . escitalopram (LEXAPRO) 20 MG tablet Take 1 tablet (20 mg total) by mouth daily.  Marland Kitchen estradiol (ESTRACE) 2 MG tablet Take 1 tablet (2 mg total) by mouth daily.  . flecainide (TAMBOCOR) 50 MG tablet Take 1 tablet (50 mg total) by mouth 2 (two) times daily. Please keep upcoming appt for future refills. Thank you  . fluticasone (FLONASE) 50 MCG/ACT nasal spray Place 2 sprays into both nostrils daily.  . furosemide (LASIX) 40 MG tablet Take 1 tablet by mouth once daily  . Garlic 4098 MG CAPS Take 1,000 mg by mouth at bedtime.   Marland Kitchen glucose blood (FREESTYLE LITE) test strip Use as instructed once daily E11.9  . guaiFENesin (MUCINEX) 600 MG 12 hr tablet Take 1 tablet (600 mg total) by mouth 2 (two) times daily as needed for cough or to loosen phlegm.  Marland Kitchen ipratropium-albuterol (DUONEB) 0.5-2.5 (3) MG/3ML SOLN Inhale 3 mLs into the lungs 4 (four) times daily.  . irbesartan (AVAPRO) 150 MG tablet TAKE 1 TABLET BY MOUTH ONCE DAILY  . Lancets MISC Use as directed once daily E11.9  . levothyroxine (SYNTHROID) 25 MCG tablet Take 1 tablet (25 mcg total) by mouth daily before breakfast.  . LORazepam (ATIVAN) 1 MG tablet Take 1 tablet by mouth twice per day as needed for anxiety.  . meloxicam (MOBIC) 15 MG tablet Take one pill a day with food for 7 days and then prn thereafter  . metFORMIN (GLUCOPHAGE) 500 MG tablet Take 1 tablet (500 mg total) by mouth daily with breakfast.  . metoprolol tartrate (LOPRESSOR) 50 MG tablet Take 1.5 tablets (75 mg total) by mouth 2 (two) times daily.  Marland Kitchen nystatin  (MYCOSTATIN/NYSTOP) powder Use as directed twice daily as needed  . Omega-3 Fatty Acids (FISH OIL) 1200 MG CAPS Take 1,200 mg by mouth daily.   . ondansetron (ZOFRAN) 4 MG tablet Take 1 tablet (4 mg total) by mouth every 8 (eight) hours as needed for nausea or vomiting.  . pantoprazole (PROTONIX) 40 MG tablet Take 1 tablet by mouth once daily  . potassium chloride SA (K-DUR,KLOR-CON) 20 MEQ tablet Take 1 tablet (20 mEq total) by mouth daily.  . pravastatin (PRAVACHOL) 40 MG tablet Take 1 tablet by mouth once daily  . Tetrahydrozoline HCl (VISINE OP) Place 1 drop into both eyes daily as needed (dry eyes).  . Tiotropium Bromide-Olodaterol (STIOLTO RESPIMAT) 2.5-2.5 MCG/ACT AERS Inhale 2 puffs into the lungs daily.  Marland Kitchen tolterodine (  DETROL LA) 4 MG 24 hr capsule Take 4 mg by mouth daily.  . vitamin E 400 UNIT capsule Take 400 Units by mouth daily.  Marland Kitchen zolpidem (AMBIEN) 5 MG tablet Take 1 tablet (5 mg total) by mouth at bedtime as needed for sleep.   No facility-administered encounter medications on file as of 11/15/2018.      Review of Systems  Review of Systems   Physical Exam  There were no vitals taken for this visit.  Wt Readings from Last 5 Encounters:  10/19/18 250 lb (113.4 kg)  09/26/18 276 lb (125.2 kg)  05/18/18 253 lb (114.8 kg)  04/15/18 259 lb (117.5 kg)  03/23/18 247 lb (112 kg)     Physical Exam   Lab Results:  CBC    Component Value Date/Time   WBC 11.3 (H) 03/23/2018 1521   RBC 4.86 03/23/2018 1521   HGB 14.8 03/23/2018 1521   HCT 45.4 03/23/2018 1521   PLT 277.0 03/23/2018 1521   MCV 93.4 03/23/2018 1521   MCH 31.2 03/18/2016 1026   MCHC 32.6 03/23/2018 1521   RDW 14.5 03/23/2018 1521   LYMPHSABS 3.6 03/23/2018 1521   MONOABS 1.0 03/23/2018 1521   EOSABS 0.2 03/23/2018 1521   BASOSABS 0.1 03/23/2018 1521    BMET    Component Value Date/Time   NA 140 03/23/2018 1521   NA 143 09/08/2016 1427   K 4.0 03/23/2018 1521   CL 106 03/23/2018 1521   CO2  24 03/23/2018 1521   GLUCOSE 143 (H) 03/23/2018 1521   BUN 16 03/23/2018 1521   BUN 11 09/08/2016 1427   CREATININE 0.90 03/23/2018 1521   CALCIUM 9.0 03/23/2018 1521   GFRNONAA 97 09/08/2016 1427   GFRAA 112 09/08/2016 1427    BNP No results found for: BNP  ProBNP    Component Value Date/Time   PROBNP 919 (H) 09/08/2016 1427      Assessment & Plan:   No problem-specific Assessment & Plan notes found for this encounter.    No follow-ups on file.   Lauraine Rinne, NP 11/15/2018   This appointment was *** minutes long with over 50% of the time in direct face-to-face patient care, assessment, plan of care, and follow-up.

## 2018-11-17 ENCOUNTER — Ambulatory Visit: Payer: Medicare Other | Admitting: Pulmonary Disease

## 2018-11-30 NOTE — Progress Notes (Signed)
@Patient  ID: Andrea Perry, female    DOB: [birth year], 68 y.o.   MRN: [id number]  Chief Complaint  Patient presents with   Follow-up    For increased SOB. Started around 2 months ago. Thinks it came from the increased weight, 20 pounds over 2 months.     Referring provider: Biagio Borg, MD   HPI:  68 year old female former smoker followed in our office for COPD with asthmatic component, history of cotton dust exposure, mild obstructive sleep apnea not currently managed on CPAP  PMH: Allergic rhinitis, hypothyroidism, dyspepsia, low back pain, abdominal pain, diabetes, IBS, hypertension, depression, anxiety Smoker/ Smoking History: Former smoker.  Quit 2005.  45-pack-year smoking history. Maintenance:  Stiolto  Pt of: Dr. Lamonte Sakai  12/01/2018  - Visit   68 year old female former smoker followed in our office for chronic cough and COPD.  Patient presenting to our office today as a follow-up visit as well as also with acute symptoms.  Patient reports she is had worsening shortness of breath over the last 2 to 3 months.  She reports this is significantly worsened over the last 2 weeks.  Patient is also been without her Stiolto Respimat for the last 2 weeks she reports she is in the donut hole and cannot afford it.  Patient is also reporting higher co-pays with Eliquis.  Patient's weight is elevated today.  She reports she has been adherent to her Lasix but admits she does not weigh herself regularly.  Last appointment with cardiology was a tele-visit.  Patient also reports that she occasionally has coughing spells.     Tests:   04/30/2018-chest x-ray-no acute cardiopulmonary process, left basilar atelectasis  10/05/2017-echocardiogram-study was not technically sufficient to allow evaluation of LV diastolic dysfunction due to A. fib, pulmonary artery systolic pressure was within normal range  09/04/2015-split-night sleep study- AHI 4.9  01/11/2014-spirometry- FVC 2.3 (83% predicted), ratio  56, FEV1 1.3 (59% predicted)  FENO:  No results found for: NITRICOXIDE  PFT: No flowsheet data found.  Imaging: Dg Chest 2 View  Result Date: 12/01/2018 CLINICAL DATA:  Shortness of breath and cough EXAM: CHEST - 2 VIEW COMPARISON:  April 29, 2018 FINDINGS: There is no evident edema or consolidation. Heart is upper normal in size with pulmonary vascularity normal. No adenopathy. There is degenerative change in the thoracic spine. IMPRESSION: No edema or consolidation.  Heart upper normal in size. Electronically Signed   By: Lowella Grip III M.D.   On: 12/01/2018 12:18      Specialty Problems      Pulmonary Problems   Allergic rhinitis    Qualifier: Diagnosis of  By: Jenny Reichmann MD, Hunt Oris       COPD (chronic obstructive pulmonary disease) ([place])    Qualifier: Diagnosis of  By: Jenny Reichmann MD, Hunt Oris       Cough   Mild obstructive sleep apnea    09/04/2015-split-night sleep study- AHI 4.9      Snoring   COPD exacerbation (HCC)   Shortness of breath      Allergies  Allergen Reactions   Tramadol Hcl Nausea And Vomiting and Other (See Comments)     mouth dryness, headache   Codeine Nausea And Vomiting and Nausea Only   Tape Rash    Plastic tape, bandaids and ekg leads, causes redness and rash   Vicodin [Hydrocodone-Acetaminophen] Nausea And Vomiting    Immunization History  Administered Date(s) Administered   Influenza, High Dose Seasonal PF 03/23/2018   Influenza,inj,Quad PF,6+ Mos  03/01/2013, 02/14/2014, 03/08/2015, 04/16/2016, 01/12/2017   Influenza-Unspecified 01/18/2017   Pneumococcal Conjugate-13 03/30/2013   Pneumococcal Polysaccharide-23 04/04/2014   Tdap 09/24/2010    Past Medical History:  Diagnosis Date   ALLERGIC RHINITIS 08/16/2008   ANXIETY 08/16/2008   Arthritis    hands, knees, lower back   ASTHMA 08/16/2008   Asthma    BRONCHITIS     DR. Lamonte Sakai     Bladder leak    Cancer (HCC)    MELANOMA       Chronic lower back pain     problems with disc L2-5   COPD 08/16/2008   DEPRESSION 08/16/2008   Dysrhythmia    hx AF   Excessive daytime sleepiness 06/19/2015   GENITAL HERPES 12/03/2006   GERD (gastroesophageal reflux disease)    H/O hiatal hernia    HEMATOCHEZIA 06/20/2009   Hemorrhoids    History of cardioversion 10/30/14   History of kidney stones    HYPERLIPIDEMIA 08/16/2008   HYPERTENSION 12/03/2006   Hypertension    Impaired glucose tolerance 09/21/2010   Overactive bladder 10/20/2015   Pneumonia    as a child   Pulmonary HTN ([place]) 06/19/2015   Shortness of breath dyspnea    Sleep apnea    MILD NO CPAP ORDERED   Snoring 06/19/2015    Tobacco History: Social History   Tobacco Use  Smoking Status Former Smoker   Packs/day: 1.50   Years: 30.00   Pack years: 45.00   Types: Cigarettes   Quit date: 04/21/2003   Years since quitting: 15.6  Smokeless Tobacco Never Used   Counseling given: Yes   Continue to not smoke  Outpatient Encounter Medications as of 12/01/2018  Medication Sig   albuterol (PROVENTIL HFA;VENTOLIN HFA) 108 (90 Base) MCG/ACT inhaler Inhale 2 puffs by mouth every 6 hours as needed for wheezing or shortness of breath. **Yearly physical due in June**   amLODipine (NORVASC) 10 MG tablet Take 1 tablet by mouth once daily   Blood Glucose Monitoring Suppl (FREESTYLE LITE) DEVI Use as directed daily E11.9   cetirizine (ZYRTEC) 10 MG tablet Take 10 mg by mouth daily.    Cholecalciferol (VITAMIN D) 2000 units tablet Take 2,000 Units by mouth daily.   Cyanocobalamin (VITAMELTS ENERGY VITAMIN B-12) 1500 MCG TBDP Take 1,500 mcg by mouth daily.   dimenhyDRINATE (DRAMAMINE) 50 MG tablet Take 50 mg by mouth daily as needed for nausea.   diphenoxylate-atropine (LOMOTIL) 2.5-0.025 MG tablet Take 1 tablet by mouth 4 (four) times daily as needed for diarrhea or loose stools.   ELIQUIS 5 MG TABS tablet TAKE 1 TABLET BY MOUTH TWICE DAILY   escitalopram (LEXAPRO) 20 MG tablet  Take 1 tablet (20 mg total) by mouth daily.   estradiol (ESTRACE) 2 MG tablet Take 1 tablet (2 mg total) by mouth daily.   flecainide (TAMBOCOR) 50 MG tablet Take 1 tablet (50 mg total) by mouth 2 (two) times daily. Please keep upcoming appt for future refills. Thank you   fluticasone (FLONASE) 50 MCG/ACT nasal spray Place 2 sprays into both nostrils daily.   furosemide (LASIX) 40 MG tablet Take 1 tablet by mouth once daily   Garlic 4098 MG CAPS Take 1,000 mg by mouth at bedtime.    glucose blood (FREESTYLE LITE) test strip Use as instructed once daily E11.9   guaiFENesin (MUCINEX) 600 MG 12 hr tablet Take 1 tablet (600 mg total) by mouth 2 (two) times daily as needed for cough or to loosen phlegm.   ipratropium-albuterol (  DUONEB) 0.5-2.5 (3) MG/3ML SOLN Inhale 3 mLs into the lungs 4 (four) times daily.   irbesartan (AVAPRO) 150 MG tablet TAKE 1 TABLET BY MOUTH ONCE DAILY   Lancets MISC Use as directed once daily E11.9   levothyroxine (SYNTHROID) 25 MCG tablet Take 1 tablet (25 mcg total) by mouth daily before breakfast.   LORazepam (ATIVAN) 1 MG tablet Take 1 tablet by mouth twice per day as needed for anxiety.   meloxicam (MOBIC) 15 MG tablet Take one pill a day with food for 7 days and then prn thereafter   metFORMIN (GLUCOPHAGE) 500 MG tablet Take 1 tablet (500 mg total) by mouth daily with breakfast.   metoprolol tartrate (LOPRESSOR) 50 MG tablet Take 1.5 tablets (75 mg total) by mouth 2 (two) times daily.   nystatin (MYCOSTATIN/NYSTOP) powder Use as directed twice daily as needed   Omega-3 Fatty Acids (FISH OIL) 1200 MG CAPS Take 1,200 mg by mouth daily.    ondansetron (ZOFRAN) 4 MG tablet Take 1 tablet (4 mg total) by mouth every 8 (eight) hours as needed for nausea or vomiting.   pantoprazole (PROTONIX) 40 MG tablet Take 1 tablet by mouth once daily   potassium chloride SA (K-DUR,KLOR-CON) 20 MEQ tablet Take 1 tablet (20 mEq total) by mouth daily.   pravastatin  (PRAVACHOL) 40 MG tablet Take 1 tablet by mouth once daily   Tetrahydrozoline HCl (VISINE OP) Place 1 drop into both eyes daily as needed (dry eyes).   tolterodine (DETROL LA) 4 MG 24 hr capsule Take 4 mg by mouth daily.   vitamin E 400 UNIT capsule Take 400 Units by mouth daily.   zolpidem (AMBIEN) 5 MG tablet Take 1 tablet (5 mg total) by mouth at bedtime as needed for sleep.   Tiotropium Bromide-Olodaterol (STIOLTO RESPIMAT) 2.5-2.5 MCG/ACT AERS Inhale 2 puffs into the lungs daily. (Patient not taking: Reported on 12/01/2018)   Tiotropium Bromide-Olodaterol (STIOLTO RESPIMAT) 2.5-2.5 MCG/ACT AERS Inhale 2 puffs into the lungs daily.   No facility-administered encounter medications on file as of 12/01/2018.      Review of Systems  Review of Systems  Constitutional: Positive for fatigue and unexpected weight change (significant weight increase). Negative for activity change and fever.  HENT: Negative for sinus pressure, sinus pain and sore throat.   Respiratory: Positive for cough, shortness of breath and wheezing.   Cardiovascular: Positive for leg swelling. Negative for chest pain and palpitations.  Gastrointestinal: Negative for diarrhea, nausea and vomiting.  Musculoskeletal: Negative for arthralgias.  Neurological: Negative for dizziness.  Psychiatric/Behavioral: Negative for sleep disturbance. The patient is not nervous/anxious.      Physical Exam  BP 122/80 (BP Location: Left Arm, Patient Position: Sitting, Cuff Size: Large)    Pulse 95    Temp 97.6 F (36.4 C) (Oral)    Ht 5\' 2"  (1.575 m)    Wt 288 lb 12.8 oz (131 kg)    SpO2 98%    BMI 52.82 kg/m   Wt Readings from Last 5 Encounters:  12/01/18 288 lb 12.8 oz (131 kg)  10/19/18 250 lb (113.4 kg)  09/26/18 276 lb (125.2 kg)  05/18/18 253 lb (114.8 kg)  04/15/18 259 lb (117.5 kg)     Physical Exam Vitals signs and nursing note reviewed.  Constitutional:      General: She is not in acute distress.    Appearance:  Normal appearance.  HENT:     Head: Normocephalic and atraumatic.     Right Ear: Tympanic membrane,  ear canal and external ear normal. There is no impacted cerumen.     Left Ear: Tympanic membrane, ear canal and external ear normal. There is no impacted cerumen.     Nose: Nose normal. No congestion.     Mouth/Throat:     Mouth: Mucous membranes are moist.     Pharynx: Oropharynx is clear.  Eyes:     Pupils: Pupils are equal, round, and reactive to light.  Neck:     Musculoskeletal: Normal range of motion.  Cardiovascular:     Rate and Rhythm: Normal rate and regular rhythm.     Pulses: Normal pulses.     Heart sounds: Normal heart sounds. No murmur.  Pulmonary:     Effort: Pulmonary effort is normal. No respiratory distress.     Breath sounds: Decreased air movement (Diminished breath sounds throughout entire exam, potentially due to body habitus) present. No decreased breath sounds, wheezing or rales.  Abdominal:     General: Abdomen is flat. Bowel sounds are normal.     Palpations: Abdomen is soft.  Musculoskeletal:     Right lower leg: Edema (Tight 4+ pitting edema) present.     Left lower leg: Edema (Tight 4 plus pitting edema) present.  Skin:    General: Skin is warm and dry.     Capillary Refill: Capillary refill takes less than 2 seconds.  Neurological:     General: No focal deficit present.     Mental Status: She is alert and oriented to person, place, and time. Mental status is at baseline.     Gait: Gait normal.  Psychiatric:        Mood and Affect: Mood normal.        Behavior: Behavior normal.        Thought Content: Thought content normal.        Judgment: Judgment normal.      Lab Results:  CBC    Component Value Date/Time   WBC 11.3 (H) 03/23/2018 1521   RBC 4.86 03/23/2018 1521   HGB 14.8 03/23/2018 1521   HCT 45.4 03/23/2018 1521   PLT 277.0 03/23/2018 1521   MCV 93.4 03/23/2018 1521   MCH 31.2 03/18/2016 1026   MCHC 32.6 03/23/2018 1521   RDW  14.5 03/23/2018 1521   LYMPHSABS 3.6 03/23/2018 1521   MONOABS 1.0 03/23/2018 1521   EOSABS 0.2 03/23/2018 1521   BASOSABS 0.1 03/23/2018 1521    BMET    Component Value Date/Time   NA 140 03/23/2018 1521   NA 143 09/08/2016 1427   K 4.0 03/23/2018 1521   CL 106 03/23/2018 1521   CO2 24 03/23/2018 1521   GLUCOSE 143 (H) 03/23/2018 1521   BUN 16 03/23/2018 1521   BUN 11 09/08/2016 1427   CREATININE 0.90 03/23/2018 1521   CALCIUM 9.0 03/23/2018 1521   GFRNONAA 97 09/08/2016 1427   GFRAA 112 09/08/2016 1427    BNP No results found for: BNP  ProBNP    Component Value Date/Time   PROBNP 919 (H) 09/08/2016 1427      Assessment & Plan:   Mild obstructive sleep apnea Plan: Continue to monitor clinically May need to consider repeat home sleep study at some point if patient starts to become symptomatic  Pulmonary HTN ([place]) Plan: Lab work today Continue Lasix daily Start weighing yourself regularly at home Keep scheduled follow-up with cardiology  COPD (chronic obstructive pulmonary disease) Plan: Resume Stiolto, samples provided today We will order pulmonary function testing Chest  x-ray today [facility] of inhaler cost  Medication management Plan: [facility] for inhaler and Eliquis cost  Shortness of breath Plan: Lab work today Chest x-ray today We will order pulmonary function testing Resume Stiolto Respimat Continue Lasix 40 mg daily   Former smoker Plan: Continue to not smoke    Return in about 2 months (around 01/31/2019), or if symptoms worsen or fail to improve, for Follow up with Dr. Lamonte Sakai, Follow up for PFT.   Lauraine Rinne, NP 12/01/2018   This appointment was 30 minutes long with over 50% of the time in direct face-to-face patient care, assessment, plan of care, and follow-up.

## 2018-12-01 ENCOUNTER — Ambulatory Visit (INDEPENDENT_AMBULATORY_CARE_PROVIDER_SITE_OTHER): Payer: Medicare Other

## 2018-12-01 ENCOUNTER — Encounter: Payer: Self-pay | Admitting: Pulmonary Disease

## 2018-12-01 ENCOUNTER — Ambulatory Visit (INDEPENDENT_AMBULATORY_CARE_PROVIDER_SITE_OTHER): Payer: Medicare Other | Admitting: Pulmonary Disease

## 2018-12-01 ENCOUNTER — Other Ambulatory Visit: Payer: Self-pay

## 2018-12-01 VITALS — BP 122/80 | HR 95 | Temp 97.6°F | Ht 62.0 in | Wt 288.8 lb

## 2018-12-01 DIAGNOSIS — R059 Cough, unspecified: Secondary | ICD-10-CM

## 2018-12-01 DIAGNOSIS — R0602 Shortness of breath: Secondary | ICD-10-CM | POA: Insufficient documentation

## 2018-12-01 DIAGNOSIS — J438 Other emphysema: Secondary | ICD-10-CM

## 2018-12-01 DIAGNOSIS — R05 Cough: Secondary | ICD-10-CM | POA: Diagnosis not present

## 2018-12-01 DIAGNOSIS — Z87891 Personal history of nicotine dependence: Secondary | ICD-10-CM | POA: Insufficient documentation

## 2018-12-01 DIAGNOSIS — I272 Pulmonary hypertension, unspecified: Secondary | ICD-10-CM | POA: Diagnosis not present

## 2018-12-01 DIAGNOSIS — G4733 Obstructive sleep apnea (adult) (pediatric): Secondary | ICD-10-CM

## 2018-12-01 DIAGNOSIS — Z79899 Other long term (current) drug therapy: Secondary | ICD-10-CM | POA: Insufficient documentation

## 2018-12-01 MED ORDER — STIOLTO RESPIMAT 2.5-2.5 MCG/ACT IN AERS: 2.0000 | INHALATION_SPRAY | Freq: Every day | RESPIRATORY_TRACT | 0 refills | Status: DC

## 2018-12-01 NOTE — Assessment & Plan Note (Signed)
Plan: Lab work today Chest x-ray today We will order pulmonary function testing Resume Stiolto Respimat Continue Lasix 40 mg daily

## 2018-12-01 NOTE — Patient Outreach (Signed)
[facility]) Care Management  12/01/2018  Andrea Perry 1951/02/15 [id number]   Telephone Screen  Referral Date: 12/01/2018 Referral Source: MD Office Referral Reason: "med mgmt-cost of [place]: [employer]   Outreach attempt # 1 to patient. No answer. RN CM left HIPAA compliant voicemail message along with contact info.    Plan: RN CM will make outreach attempt to patient within 3-4 business days. RN CM will send unsuccessful outreach letter to patient.   Enzo Montgomery, RN,BSN,CCM [place] Telephonic Care Management Coordinator Direct Phone: [phone] Toll Free: [phone] Fax: [phone]

## 2018-12-01 NOTE — Patient Instructions (Addendum)
You were seen today by Lauraine Rinne, NP  for:  1. Other emphysema ([place])  - AMB Referral to [place] Management - Pulmonary function test; Future - DG Chest 2 View; Future  Restart Stiolto Respimat inhaler >>>2 puffs daily >>>Take this no matter what >>>This is not a rescue inhaler  Note your daily symptoms > remember "red flags" for COPD:   >>>Increase in cough >>>increase in sputum production >>>increase in shortness of breath or activity  intolerance.   If you notice these symptoms, please call the office to be seen.     2. Cough  - DG Chest 2 View; Future  3. Pulmonary HTN ([place])  - Pro b natriuretic peptide (BNP); Future  Patient Education for Fluid Management  Do the following things EVERY DAY:   1. Weigh yourself EVERY morning after you go to the bathroom but before you eat or drink anything. Write this number down in a weight log/diary.    2. Take your medicines as prescribed. If you have concerns about your medications, please call us before you stop taking them.    3. Eat low salt foods-Limit salt (sodium) to 2000 mg per day. This will help prevent your body from holding onto fluid. Read food labels as many processed foods have a lot of sodium, especially canned goods and prepackaged meats. If you would like some assistance choosing low sodium foods, we would be happy to set you up with a nutritionist.   4. Stay as active as you can everyday. Staying active will give you more energy and make your muscles stronger. Start with 5 minutes at a time and work your way up to 30 minutes a day. Break up your activities--do some in the morning and some in the afternoon. Start with 3 days per week and work your way up to 5 days as you can.  If you have chest pain, feel short of breath, dizzy, or lightheaded, STOP. If you don't feel better after a short rest, call 911. If you do feel better, call the office to let us know you have symptoms with exercise.   5. Limit all fluids for  the day to less than 2 liters. Fluid includes all drinks, coffee, juice, ice chips, soup, jello, and all other liquids.   4. Medication management  - AMB Referral to [facility] [place] Management  5. Shortness of breath  - Pulmonary function test; Future - Pro b natriuretic peptide (BNP); Future - Comp Met (CMET); Future - CBC with Differential/Platelet; Future - TSH; Future - DG Chest 2 View; Future     ICD-10-CM   1. Other emphysema ([place])  J43.8 AMB Referral to [place] [facility]    Pulmonary function test    DG Chest 2 View  2. Cough  R05 DG Chest 2 View  3. Pulmonary HTN (HCC)  I27.20 Pro b natriuretic peptide (BNP)  4. Medication management  Z79.899 AMB Referral to [place] Management  5. Shortness of breath  R06.02 Pulmonary function test    Pro b natriuretic peptide (BNP)    Comp Met (CMET)    CBC with Differential/Platelet    TSH    DG Chest 2 View     We recommend today:  Orders Placed This Encounter  Procedures  . DG Chest 2 View    Standing Status:   Future    Standing Expiration Date:   01/31/2020    Order Specific Question:   Reason for Exam (SYMPTOM  OR DIAGNOSIS  REQUIRED)    Answer:   shortness of breath    Order Specific Question:   Preferred imaging location?    Answer:   Internal    Order Specific Question:   Radiology Contrast Protocol - do NOT remove file path    Answer:   [email or web address]  . Pro b natriuretic peptide (BNP)    Standing Status:   Future    Standing Expiration Date:   12/01/2019  . Comp Met (CMET)    Standing Status:   Future    Standing Expiration Date:   12/01/2019  . CBC with Differential/Platelet    Standing Status:   Future    Standing Expiration Date:   12/01/2019  . TSH    Standing Status:   Future    Standing Expiration Date:   12/01/2019  . AMB Referral to [place] Management    Referral Priority:   Routine    Referral Type:   Consultation    Referral Reason:   [facility]     Number of Visits Requested:   1  . Pulmonary function test    Standing Status:   Future    Standing Expiration Date:   12/01/2019    Order Specific Question:   Where should this test be performed?    Answer:   [facility]    Order Specific Question:   Full PFT: includes the following: basic spirometry, spirometry pre & post bronchodilator, diffusion capacity (DLCO), lung volumes    Answer:   Full PFT   Orders Placed This Encounter  Procedures  . DG Chest 2 View  . Pro b natriuretic peptide (BNP)  . Comp Met (CMET)  . CBC with Differential/Platelet  . TSH  . AMB Referral to [place] [facility]  . Pulmonary function test   Meds ordered this encounter  Medications  . Tiotropium Bromide-Olodaterol (STIOLTO RESPIMAT) 2.5-2.5 MCG/ACT AERS    Sig: Inhale 2 puffs into the lungs daily.    Dispense:  8 g    Refill:  0    Order Specific Question:   Lot Number?    Answer:   [id number]    Order Specific Question:   Expiration Date?    Answer:   01/17/2021    Follow Up:    Return in about 2 months (around 01/31/2019), or if symptoms worsen or fail to improve, for Follow up with Dr. Lamonte Sakai, Follow up for PFT.   Please do your part to reduce the spread of COVID-19:      Reduce your risk of any infection  and COVID19 by using the similar precautions used for avoiding the common cold or flu:  Marland Kitchen Wash your hands often with soap and warm water for at least 20 seconds.  If soap and water are not readily available, use an alcohol-based hand sanitizer with at least 60% alcohol.  . If coughing or sneezing, cover your mouth and nose by coughing or sneezing into the elbow areas of your shirt or coat, into a tissue or into your sleeve (not your hands). Langley Gauss A MASK when in public  . Avoid shaking hands with others and consider head nods or verbal greetings only. . Avoid touching your eyes, nose, or mouth with unwashed hands.  . Avoid close contact with people who are sick. . Avoid places or  events with large numbers of people in one location, like concerts or sporting events. . If you have some symptoms but not all symptoms, continue to monitor at  home and seek medical attention if your symptoms worsen. . If you are having a medical emergency, call 911.   [place] / e-Visit: [email or web address]         [facility]: [facility]: [phone]                   [facility]: [phone]     It is flu season:   >>> Best ways to protect herself from the flu: Receive the yearly flu vaccine, practice good hand hygiene washing with soap and also using hand sanitizer when available, eat a nutritious meals, get adequate rest, hydrate appropriately   Please contact the office if your symptoms worsen or you have concerns that you are not improving.   Thank you for choosing [facility] for your healthcare, and for allowing [place] to partner with you on your healthcare journey. I am thankful to be able to provide care to you today.   Wyn Quaker FNP-C

## 2018-12-01 NOTE — Progress Notes (Signed)
Your chest x-ray results of come back.  Showing no acute changes.  No plan of care changes at this time.  Keep follow-up appointment.    Follow-up with our office if symptoms worsen or you do not feel like you are improving under her current regimen.  It was a pleasure taking care of you,  Brian Mack, FNP 

## 2018-12-01 NOTE — Assessment & Plan Note (Signed)
Plan: [facility]erral [facility] for inhaler and Eliquis cost

## 2018-12-01 NOTE — Assessment & Plan Note (Signed)
Plan: Continue to not smoke 

## 2018-12-01 NOTE — Assessment & Plan Note (Signed)
Plan: Resume Stiolto, samples provided today We will order pulmonary function testing Chest x-ray today [facility] for management of inhaler cost

## 2018-12-01 NOTE — Assessment & Plan Note (Signed)
Plan: Continue to monitor clinically May need to consider repeat home sleep study at some point if patient starts to become symptomatic

## 2018-12-01 NOTE — Assessment & Plan Note (Signed)
Plan: Lab work today Continue Lasix daily Start weighing yourself regularly at home Keep scheduled follow-up with cardiology

## 2018-12-06 ENCOUNTER — Ambulatory Visit (INDEPENDENT_AMBULATORY_CARE_PROVIDER_SITE_OTHER): Payer: Medicare Other | Admitting: Sports Medicine

## 2018-12-06 ENCOUNTER — Other Ambulatory Visit: Payer: Self-pay

## 2018-12-06 VITALS — BP 118/88 | Ht 62.0 in | Wt 288.0 lb

## 2018-12-06 DIAGNOSIS — M67912 Unspecified disorder of synovium and tendon, left shoulder: Secondary | ICD-10-CM

## 2018-12-06 DIAGNOSIS — R29898 Other symptoms and signs involving the musculoskeletal system: Secondary | ICD-10-CM

## 2018-12-06 MED ORDER — METHYLPREDNISOLONE ACETATE 40 MG/ML IJ SUSP
40.0000 mg | Freq: Once | INTRAMUSCULAR | Status: AC
  Administered 2018-12-06: 40 mg via INTRA_ARTICULAR

## 2018-12-06 NOTE — Progress Notes (Signed)
PCP: Biagio Borg, MD  Subjective:  CC: Left shoulder pain  HPI: Patient is a 68 y.o. female here for follow-up of left shoulder pain.  The patient reports that she started having a soreness/throbbing pain in her left shoulder that started about 1 week ago.  The pain is not worse any particular time of day, it is not waking her up at night.  To alleviate pain the patient takes Tylenol for arthritis, which helps a little.  Certain arm motions make her pain worse, although she cannot specifically state with these arm motions are.  The patient has a chair at home that is difficult to get into and out of, she believes that increased strain of pushing herself out of this chair has exacerbated her shoulder pain.  Otherwise, she denies fevers, vision changes, shortness of breath, chest pain, nausea, vomiting, abdominal pain, and myalgias.  Past Medical History:  Diagnosis Date  . ALLERGIC RHINITIS 08/16/2008  . ANXIETY 08/16/2008  . Arthritis    hands, knees, lower back  . ASTHMA 08/16/2008  . Asthma    BRONCHITIS     DR. Lamonte Sakai    . Bladder leak   . Cancer ([place])    MELANOMA      . Chronic lower back pain    problems with disc L2-5  . COPD 08/16/2008  . DEPRESSION 08/16/2008  . Dysrhythmia    hx AF  . Excessive daytime sleepiness 06/19/2015  . GENITAL HERPES 12/03/2006  . GERD (gastroesophageal reflux disease)   . H/O hiatal hernia   . HEMATOCHEZIA 06/20/2009  . Hemorrhoids   . History of cardioversion 10/30/14  . History of kidney stones   . HYPERLIPIDEMIA 08/16/2008  . HYPERTENSION 12/03/2006  . Hypertension   . Impaired glucose tolerance 09/21/2010  . Overactive bladder 10/20/2015  . Pneumonia    as a child  . Pulmonary HTN ([place]) 06/19/2015  . Shortness of breath dyspnea   . Sleep apnea    MILD NO CPAP ORDERED  . Snoring 06/19/2015    Current Outpatient Medications on File Prior to Visit  Medication Sig Dispense Refill  . albuterol (PROVENTIL HFA;VENTOLIN HFA) 108 (90 Base) MCG/ACT inhaler  Inhale 2 puffs by mouth every 6 hours as needed for wheezing or shortness of breath. **Yearly physical due in June** 1 Inhaler 11  . amLODipine (NORVASC) 10 MG tablet Take 1 tablet by mouth once daily 90 tablet 0  . Blood Glucose Monitoring Suppl (FREESTYLE LITE) DEVI Use as directed daily E11.9 1 each 0  . cetirizine (ZYRTEC) 10 MG tablet Take 10 mg by mouth daily.     . Cholecalciferol (VITAMIN D) 2000 units tablet Take 2,000 Units by mouth daily.    . Cyanocobalamin (VITAMELTS ENERGY VITAMIN B-12) 1500 MCG TBDP Take 1,500 mcg by mouth daily.    Marland Kitchen dimenhyDRINATE (DRAMAMINE) 50 MG tablet Take 50 mg by mouth daily as needed for nausea.    . diphenoxylate-atropine (LOMOTIL) 2.5-0.025 MG tablet Take 1 tablet by mouth 4 (four) times daily as needed for diarrhea or loose stools. 40 tablet 0  . ELIQUIS 5 MG TABS tablet TAKE 1 TABLET BY MOUTH TWICE DAILY 180 tablet 1  . escitalopram (LEXAPRO) 20 MG tablet Take 1 tablet (20 mg total) by mouth daily. 90 tablet 3  . estradiol (ESTRACE) 2 MG tablet Take 1 tablet (2 mg total) by mouth daily. 90 tablet 3  . flecainide (TAMBOCOR) 50 MG tablet Take 1 tablet (50 mg total) by mouth 2 (two)  times daily. Please keep upcoming appt for future refills. Thank you 180 tablet 0  . fluticasone (FLONASE) 50 MCG/ACT nasal spray Place 2 sprays into both nostrils daily. 16 g 2  . furosemide (LASIX) 40 MG tablet Take 1 tablet by mouth once daily 90 tablet 1  . Garlic 7829 MG CAPS Take 1,000 mg by mouth at bedtime.     Marland Kitchen glucose blood (FREESTYLE LITE) test strip Use as instructed once daily E11.9 100 each 12  . guaiFENesin (MUCINEX) 600 MG 12 hr tablet Take 1 tablet (600 mg total) by mouth 2 (two) times daily as needed for cough or to loosen phlegm. 60 tablet 2  . ipratropium-albuterol (DUONEB) 0.5-2.5 (3) MG/3ML SOLN Inhale 3 mLs into the lungs 4 (four) times daily. 360 mL 0  . irbesartan (AVAPRO) 150 MG tablet TAKE 1 TABLET BY MOUTH ONCE DAILY 30 tablet 11  . Lancets MISC Use  as directed once daily E11.9 100 each 11  . levothyroxine (SYNTHROID) 25 MCG tablet Take 1 tablet (25 mcg total) by mouth daily before breakfast. 90 tablet 3  . LORazepam (ATIVAN) 1 MG tablet Take 1 tablet by mouth twice per day as needed for anxiety. 60 tablet 2  . meloxicam (MOBIC) 15 MG tablet Take one pill a day with food for 7 days and then prn thereafter 30 tablet 0  . metFORMIN (GLUCOPHAGE) 500 MG tablet Take 1 tablet (500 mg total) by mouth daily with breakfast. 90 tablet 3  . metoprolol tartrate (LOPRESSOR) 50 MG tablet Take 1.5 tablets (75 mg total) by mouth 2 (two) times daily. 270 tablet 0  . nystatin (MYCOSTATIN/NYSTOP) powder Use as directed twice daily as needed 30 g 1  . Omega-3 Fatty Acids (FISH OIL) 1200 MG CAPS Take 1,200 mg by mouth daily.     . ondansetron (ZOFRAN) 4 MG tablet Take 1 tablet (4 mg total) by mouth every 8 (eight) hours as needed for nausea or vomiting. 30 tablet 1  . pantoprazole (PROTONIX) 40 MG tablet Take 1 tablet by mouth once daily 90 tablet 1  . potassium chloride SA (K-DUR,KLOR-CON) 20 MEQ tablet Take 1 tablet (20 mEq total) by mouth daily. 90 tablet 3  . pravastatin (PRAVACHOL) 40 MG tablet Take 1 tablet by mouth once daily 90 tablet 1  . Tetrahydrozoline HCl (VISINE OP) Place 1 drop into both eyes daily as needed (dry eyes).    . tolterodine (DETROL LA) 4 MG 24 hr capsule Take 4 mg by mouth daily.    . vitamin E 400 UNIT capsule Take 400 Units by mouth daily.    Marland Kitchen zolpidem (AMBIEN) 5 MG tablet Take 1 tablet (5 mg total) by mouth at bedtime as needed for sleep. 90 tablet 1  . Tiotropium Bromide-Olodaterol (STIOLTO RESPIMAT) 2.5-2.5 MCG/ACT AERS Inhale 2 puffs into the lungs daily. (Patient not taking: Reported on 12/01/2018) 1 Inhaler 2  . Tiotropium Bromide-Olodaterol (STIOLTO RESPIMAT) 2.5-2.5 MCG/ACT AERS Inhale 2 puffs into the lungs daily. (Patient not taking: Reported on 12/06/2018) 8 g 0   No current facility-administered medications on file prior  to visit.     Past Surgical History:  Procedure Laterality Date  . ABDOMINAL HYSTERECTOMY    . APPENDECTOMY    . CARDIOVERSION N/A 03/20/2014   Procedure: CARDIOVERSION;  Surgeon: Josue Hector, MD;  Location: [facility] ENDOSCOPY;  Service: Cardiovascular;  Laterality: N/A;  . CARDIOVERSION N/A 10/30/2014   Procedure: CARDIOVERSION;  Surgeon: Josue Hector, MD;  Location: [place];  Service:  Cardiovascular;  Laterality: N/A;  . CARPAL TUNNEL RELEASE     Right + LEFT  . CESAREAN SECTION     x 1   . COLONOSCOPY  2004  . CYST EXCISION     RT ARM   . CYSTOCELE REPAIR N/A 10/25/2012   Procedure: ANTERIOR REPAIR (CYSTOCELE);  Surgeon: Gus Height, MD;  Location: [facility];  Service: Gynecology;  Laterality: N/A;  . HEEL SPUR SURGERY Bilateral   . KNEE SURGERY     Right + LEFT  . LUMBAR LAMINECTOMY/DECOMPRESSION MICRODISCECTOMY N/A 03/19/2016   Procedure: Laminectomy and Foraminotomy - Lumbar three-four - Lumbar four-five;  Surgeon: Eustace Moore, MD;  Location: [place];  Service: Neurosurgery;  Laterality: N/A;  . RADIOLOGY WITH ANESTHESIA Right 11/01/2014   Procedure: MRI RIGHT FOREARM;  Surgeon: Medication Radiologist, MD;  Location: [place];  Service: Radiology;  Laterality: Right;  . RADIOLOGY WITH ANESTHESIA Right 08/29/2015   Procedure: MRI - RIGHT FOREARM;  Surgeon: Medication Radiologist, MD;  Location: [place];  Service: Radiology;  Laterality: Right;  . RADIOLOGY WITH ANESTHESIA N/A 12/05/2015   Procedure: MRI SPINE WITHOUT;  Surgeon: Medication Radiologist, MD;  Location: [place];  Service: Radiology;  Laterality: N/A;  . SKIN CANCER EXCISION     BILAT SHOULDERS  . SPLIT NIGHT STUDY  09/04/2015  . TONSILLECTOMY AND ADENOIDECTOMY      Allergies  Allergen Reactions  . Tramadol Hcl Nausea And Vomiting and Other (See Comments)     mouth dryness, headache  . Codeine Nausea And Vomiting and Nausea Only  . Tape Rash    Plastic tape, bandaids and ekg leads, causes redness and rash  . Vicodin  [Hydrocodone-Acetaminophen] Nausea And Vomiting    Social History   Socioeconomic History  . Marital status: Widowed    Spouse name: Not on file  . Number of children: 1  . Years of education: Not on file  . Highest education level: Not on file  Occupational History  . Occupation: currently unemployed, former [employer]  Social Needs  . Financial resource strain: Not on file  . Food insecurity    Worry: Not on file    Inability: Not on file  . Transportation needs    Medical: Not on file    Non-medical: Not on file  Tobacco Use  . Smoking status: Former Smoker    Packs/day: 1.50    Years: 30.00    Pack years: 45.00    Types: Cigarettes    Quit date: 04/21/2003    Years since quitting: 15.6  . Smokeless tobacco: Never Used  Substance and Sexual Activity  . Alcohol use: Yes    Comment: OCCAS  . Drug use: No  . Sexual activity: Not Currently    Birth control/protection: None  Lifestyle  . Physical activity    Days per week: Not on file    Minutes per session: Not on file  . Stress: Not on file  Relationships  . Social Herbalist on phone: Not on file    Gets together: Not on file    Attends religious service: Not on file    Active member of club or organization: Not on file    Attends meetings of clubs or organizations: Not on file    Relationship status: Not on file  . Intimate partner violence    Fear of current or ex partner: Not on file    Emotionally abused: Not on file    Physically  abused: Not on file    Forced sexual activity: Not on file  Other Topics Concern  . Not on file  Social History Narrative   3 caffeine drinks daily     Family History  Problem Relation Age of Onset  . Diabetes Mother   . Hyperlipidemia Mother   . Hypertension Mother   . Colon polyps Mother   . Stroke Father   . Colon cancer Neg Hx     BP 118/88   Ht 5\' 2"  (1.575 m)   Wt 288 lb (130.6 kg)   BMI 52.68 kg/m   Review of Systems: See HPI  above.     Objective:  Physical Exam:  Gen: NAD, comfortable in exam room, very pleasant patient Respiratory: normal work of breathing, occasional cough during exam Neck: No masses or thyromegaly or limitation in range of motion Right Shoulder: -Inspection: no obvious deformity, atrophy, or asymmetry. No bruising. No swelling -Palpation is normal with no TTP over [facility] joint or bicipital groove. -Limited ROM in flexion, abduction; full range of motion in internal/external rotation; full painless passive range of motion with internal and external rotation -Strength: 4/5 strength in external rotation, 5/5 strength appreciated with internal rotation -Special Tests:  --- Impingement: Negative full can sign. --- No drop arm sign  Left Shoulder: -Inspection reveals no obvious deformity, atrophy, or asymmetry. No bruising. No swelling -Palpation is normal with no TTP over [facility] joint or bicipital groove. -Full ROM in flexion, abduction, internal/external rotation -Strength: 4/5 strength with full can sign and 4/5 external rotation, otherwise 5/5 strength appreciated -Special Tests:  --- No drop arm sign   Procedure performed: Subacromial corticosteroid injection to Left Shoulder Consent obtained and verified. Patient was seated on exam table. Time-out conducted. Noted no overlying erythema, induration, or other signs of local infection. The left subacromial joint was visualized from posterior approach, the injection site was palpated and marked with pen. The overlying skin was prepped with alcohol swab in a sterile fashion. Utilizing posterior approach, patient's left shoulder was injected with 3:1 xylocaine:depomedrol using 25-gauge 1.5 inch needle. Patient tolerated the procedure well without immediate complications.  Assessment & Plan:  1. Left shoulder pain: shoulder pain may have been exacerbated from difficulty with rising from a chair, concerning for deconditioning. Weaknesses were  appreciated on physical exam to the patient's bilateral shoulders. -Patient would benefit from Physical Therapy for her shoulder and for quad strengthening - Ambulatory referral for PT placed -Corticosteroid injection to left shoulder was performed today  -No imaging needed at this time but may consider if pain worsens or patient is considering shoulder surgery  Follow up in 6 weeks  Milus Banister, Lynn, PGY-2 12/06/2018 10:53 AM  Patient seen and evaluated with the resident.  I agree with the above plan of care.  Patient has responded well in the past to subacromial cortisone injections.  We will repeat that injection today.  I have highly recommended formal physical therapy not only for rotator cuff strengthening but for generalized lower extremity strengthening as well.  Patient will follow-up with me again in 6 weeks.  Given her overall improvement with past cortisone injections, I do not think we need any imaging of the left shoulder today but if symptoms persist at follow-up I would reconsider this.

## 2018-12-06 NOTE — Patient Outreach (Signed)
[facility]) Care Management  12/06/2018  Andrea Perry 05-May-1950 [id number]   Telephone Screen  Referral Date: 12/01/2018 Referral Source: MD Office Referral Reason: "med mgmt-cost of [place]: [employer]   Outreach attempt #2 to patient. No answer. RN CM left HIPAA compliant voicemail message along with contact info.      Plan: RN CM will make outreach attempt to patient within 3-4 business days.   Enzo Montgomery, RN,BSN,CCM [place] [facility] Telephonic Care Management Coordinator Direct Phone: [phone] Toll Free: [phone] Fax: [phone]

## 2018-12-07 ENCOUNTER — Encounter: Payer: Self-pay | Admitting: Sports Medicine

## 2018-12-07 ENCOUNTER — Other Ambulatory Visit: Payer: Self-pay

## 2018-12-07 DIAGNOSIS — L821 Other seborrheic keratosis: Secondary | ICD-10-CM | POA: Diagnosis not present

## 2018-12-07 DIAGNOSIS — I872 Venous insufficiency (chronic) (peripheral): Secondary | ICD-10-CM | POA: Diagnosis not present

## 2018-12-07 DIAGNOSIS — I8312 Varicose veins of left lower extremity with inflammation: Secondary | ICD-10-CM | POA: Diagnosis not present

## 2018-12-07 DIAGNOSIS — I8311 Varicose veins of right lower extremity with inflammation: Secondary | ICD-10-CM | POA: Diagnosis not present

## 2018-12-07 NOTE — Patient Outreach (Signed)
[facility]) Care Management  12/07/2018  Andrea Perry 10/15/1950 [id number]   Telephone Screen  Referral Date:12/01/2018 Referral Source:MD Office Referral Reason:"med mgmt-cost of Stiolto & Eliquis" Insurance:BC[employer] Medicare    Outreach attempt #3 to patient. No answer at present.      Plan: RN CM will close case if no response from letter mailed to patient.    Enzo Montgomery, RN,BSN,CCM [place] [facility] Telephonic Care Management Coordinator Direct Phone: [phone] Toll Free: [phone] Fax: [phone]

## 2018-12-08 ENCOUNTER — Ambulatory Visit: Payer: Self-pay

## 2018-12-08 ENCOUNTER — Ambulatory Visit: Payer: Medicare Other | Attending: Sports Medicine | Admitting: Physical Therapy

## 2018-12-08 ENCOUNTER — Other Ambulatory Visit: Payer: Self-pay

## 2018-12-08 ENCOUNTER — Encounter: Payer: Self-pay | Admitting: Physical Therapy

## 2018-12-08 DIAGNOSIS — M25561 Pain in right knee: Secondary | ICD-10-CM | POA: Diagnosis not present

## 2018-12-08 DIAGNOSIS — M25511 Pain in right shoulder: Secondary | ICD-10-CM | POA: Insufficient documentation

## 2018-12-08 DIAGNOSIS — R2689 Other abnormalities of gait and mobility: Secondary | ICD-10-CM

## 2018-12-08 DIAGNOSIS — M25512 Pain in left shoulder: Secondary | ICD-10-CM | POA: Insufficient documentation

## 2018-12-08 DIAGNOSIS — G8929 Other chronic pain: Secondary | ICD-10-CM | POA: Diagnosis not present

## 2018-12-08 DIAGNOSIS — M25562 Pain in left knee: Secondary | ICD-10-CM | POA: Insufficient documentation

## 2018-12-09 ENCOUNTER — Encounter: Payer: Self-pay | Admitting: Physical Therapy

## 2018-12-10 ENCOUNTER — Other Ambulatory Visit: Payer: Self-pay | Admitting: Cardiovascular Disease

## 2018-12-13 ENCOUNTER — Other Ambulatory Visit: Payer: Self-pay | Admitting: Emergency Medicine

## 2018-12-13 ENCOUNTER — Ambulatory Visit: Payer: Medicare Other | Admitting: Physical Therapy

## 2018-12-13 DIAGNOSIS — R05 Cough: Secondary | ICD-10-CM

## 2018-12-13 DIAGNOSIS — R053 Chronic cough: Secondary | ICD-10-CM

## 2018-12-13 NOTE — Therapy (Signed)
[place], [place], [address] Phone: [phone]   Fax:  [phone]  Physical Therapy Evaluation  Patient Details  Name: Andrea Perry MRN: [id number] Date of Birth: [birth year] Referring Provider (PT): Dr Shirlyn Goltz    Encounter Date: 12/08/2018  PT End of Session - 12/13/18 1100    Visit Number  1    Number of Visits  12    Date for PT Re-Evaluation  01/19/19    PT Start Time  [id number]    PT Stop Time  1226    PT Time Calculation (min)  41 min    Activity Tolerance  Patient tolerated treatment well    Behavior During Therapy  [facility] for tasks assessed/performed       Past Medical History:  Diagnosis Date  . ALLERGIC RHINITIS 08/16/2008  . ANXIETY 08/16/2008  . Arthritis    hands, knees, lower back  . ASTHMA 08/16/2008  . Asthma    BRONCHITIS     DR. Lamonte Sakai    . Bladder leak   . Cancer ([place])    MELANOMA      . Chronic lower back pain    problems with disc L2-5  . COPD 08/16/2008  . DEPRESSION 08/16/2008  . Dysrhythmia    hx AF  . Excessive daytime sleepiness 06/19/2015  . GENITAL HERPES 12/03/2006  . GERD (gastroesophageal reflux disease)   . H/O hiatal hernia   . HEMATOCHEZIA 06/20/2009  . Hemorrhoids   . History of cardioversion 10/30/14  . History of kidney stones   . HYPERLIPIDEMIA 08/16/2008  . HYPERTENSION 12/03/2006  . Hypertension   . Impaired glucose tolerance 09/21/2010  . Overactive bladder 10/20/2015  . Pneumonia    as a child  . Pulmonary HTN ([place]) 06/19/2015  . Shortness of breath dyspnea   . Sleep apnea    MILD NO CPAP ORDERED  . Snoring 06/19/2015    Past Surgical History:  Procedure Laterality Date  . ABDOMINAL HYSTERECTOMY    . APPENDECTOMY    . CARDIOVERSION N/A 03/20/2014   Procedure: CARDIOVERSION;  Surgeon: Josue Hector, MD;  Location: [facility] ENDOSCOPY;  Service: Cardiovascular;  Laterality: N/A;  . CARDIOVERSION N/A 10/30/2014   Procedure: CARDIOVERSION;  Surgeon: Josue Hector, MD;  Location: [facility] ENDOSCOPY;  Service: Cardiovascular;  Laterality: N/A;  . CARPAL TUNNEL RELEASE     Right + LEFT  . CESAREAN SECTION     x 1   . COLONOSCOPY  2004  . CYST EXCISION     RT ARM   . CYSTOCELE REPAIR N/A 10/25/2012   Procedure: ANTERIOR REPAIR (CYSTOCELE);  Surgeon: Gus Height, MD;  Location: [facility];  Service: Gynecology;  Laterality: N/A;  . HEEL SPUR SURGERY Bilateral   . KNEE SURGERY     Right + LEFT  . LUMBAR LAMINECTOMY/DECOMPRESSION MICRODISCECTOMY N/A 03/19/2016   Procedure: Laminectomy and Foraminotomy - Lumbar three-four - Lumbar four-five;  Surgeon: Eustace Moore, MD;  Location: [place];  Service: Neurosurgery;  Laterality: N/A;  . RADIOLOGY WITH ANESTHESIA Right 11/01/2014   Procedure: MRI RIGHT FOREARM;  Surgeon: Medication Radiologist, MD;  Location: [place];  Service: Radiology;  Laterality: Right;  . RADIOLOGY WITH ANESTHESIA Right 08/29/2015   Procedure: MRI - RIGHT FOREARM;  Surgeon: Medication Radiologist, MD;  Location: [place];  Service: Radiology;  Laterality: Right;  . RADIOLOGY WITH ANESTHESIA N/A 12/05/2015   Procedure: MRI SPINE WITHOUT;  Surgeon: Medication Radiologist, MD;  Location: [place];  Service: Radiology;  Laterality: N/A;  . SKIN CANCER EXCISION     BILAT SHOULDERS  . SPLIT NIGHT STUDY  09/04/2015  . TONSILLECTOMY AND ADENOIDECTOMY      There were no vitals filed for this visit.   Subjective Assessment - 12/13/18 1101    Subjective  Patient reports bilateral knee pain for several years now. It comes and goes depending on activity. Her left one has been more severe. She recently had a cortisone shot that helped. She feels like over time her legs are getting weaker. She has arthritis in her knees. She feels like when she stands for too long her knees begin to give out. She also is having shoulder pain. Her left shoulder is theworst. She has the most pain when she uses her phone.    Limitations  Lifting    How long can you sit comfortably?   stiffens when she sits    How long can you stand comfortably?  < 5 min    How long can you walk comfortably?  limited distance    Diagnostic tests  X-rays: moderate to severe try compartmental OA    Patient Stated Goals  to have less pain with gait    Currently in Pain?  Yes    Pain Score  5     Pain Location  Knee    Pain Orientation  Left    Pain Descriptors / Indicators  Aching    Pain Type  Chronic pain    Pain Onset  More than a month ago    Pain Frequency  Intermittent    Aggravating Factors   standing and walking    Pain Relieving Factors  rest    Effect of Pain on Daily Activities  difficulty holding her phone    Multiple Pain Sites  Yes    Pain Score  5    Pain Location  Shoulder    Pain Orientation  Left    Pain Descriptors / Indicators  Aching    Pain Type  Chronic pain    Pain Onset  More than a month ago    Pain Frequency  Intermittent    Aggravating Factors   use of her phone    Pain Relieving Factors  rest    Effect of Pain on Daily Activities  difficulty using her phone         [facility] PT Assessment - 12/13/18 0001      Assessment   Medical Diagnosis  Bilateral shoulder pain     Referring Provider (PT)  Dr Shirlyn Goltz     Onset Date/Surgical Date  --   Several years    Hand Dominance  Right    Prior Therapy  Years ago for her knees and back       Precautions   Precautions  None      Restrictions   Weight Bearing Restrictions  No      Balance Screen   Has the patient fallen in the past 6 months  No    Has the patient had a decrease in activity level because of a fear of falling?   No    Is the patient reluctant to leave their home because of a fear of falling?   No      Home Environment   Additional Comments  Back steps needs to go one step at a time       Prior Function   Level of Independence  Independent  Vocation  On disability    Leisure  Not really       Cognition   Overall Cognitive Status  Within Functional Limits for tasks  assessed    Attention  Focused    Focused Attention  Appears intact    Memory  Appears intact    Awareness  Appears intact    Problem Solving  Appears intact      Sensation   Light Touch  Appears Intact      Coordination   Gross Motor Movements are Fluid and Coordinated  Yes    Fine Motor Movements are Fluid and Coordinated  Yes      Functional Tests   Functional tests  Sit to Stand;Other      Sit to Stand   Comments  15 sec 5x       Other:   Other/ Comments  TUG trial 1 21 trial 2 21 second       AROM   Right Shoulder Flexion  130 Degrees    Right Shoulder Internal Rotation  --   full   Right Shoulder External Rotation  --   can reach the back of the head but has some pain    Left Shoulder Flexion  100 Degrees    Left Shoulder Internal Rotation  --   can reach to the left gluteal    Left Shoulder External Rotation  --   Painful reaching to the back of the head      Strength   Right Shoulder Flexion  4/5    Right Shoulder Internal Rotation  4+/5    Right Shoulder External Rotation  4/5    Left Shoulder Flexion  4/5    Left Shoulder External Rotation  4/5    Left Shoulder Horizontal ABduction  5/5    Right Hip Flexion  4/5    Right Hip ABduction  4/5    Right Hip ADduction  5/5    Left Hip Flexion  4/5    Left Hip ABduction  4/5    Left Hip ADduction  4/5    Right Knee Flexion  4/5    Right Knee Extension  4/5    Left Knee Flexion  4/5    Left Knee Extension  4/5                Objective measurements completed on examination: See above findings.      [facility] Adult PT Treatment/Exercise - 12/13/18 0001      Lumbar Exercises: Seated   LAQ on Chair Limitations  x10 90-45    Other Seated Lumbar Exercises  clam shell x15 yellow       Shoulder Exercises: Seated   Horizontal ABduction Limitations  arms down hands together to legs X10     External Rotation Limitations  bilateral yellow hands together to legs x10             PT Education -  12/13/18 1143    Education Details  HE, symptom mangement    Person(s) Educated  Patient    Methods  Explanation;Demonstration;Verbal cues;Tactile cues    Comprehension  Verbalized understanding;Returned demonstration;Verbal cues required;Tactile cues required       PT Short Term Goals - 12/09/18 0714      PT SHORT TERM GOAL #1   Title  STG =LTG        PT Long Term Goals - 12/13/18 1144      PT LONG TERM GOAL #1   Title  Patient will decreased TUG tinme to 15 sec in order to reduce fall risk    Time  6    Period  Weeks    Status  New      PT LONG TERM GOAL #2   Title  Patient will increase active left shoulder flexion to 135 degrees in order to perfrom IADl's    Time  6    Period  Weeks    Status  New      PT LONG TERM GOAL #3   Title  Patient will perfrom sit to stand test in 10 seconds in order to demonstrate improved functional mobility    Time  6    Period  Weeks    Status  New      PT LONG TERM GOAL #4   Title  Patient will be independent with UE/LE strength and coditioning program    Time  6    Period  Weeks    Status  New             Plan - 12/13/18 1101    Clinical Impression Statement  Patient is a 68 year old female with bilateral shoulder pain and bilateral lower extremity wekaness. Both problem have been long standing and progressing. She has increased pain in her shoulders when she ses her phone for long periods of time. She has decreased active mottion of her left shoulder and decreased strength bilaterally. She has poor posture which is a likley contributing to her pain. When she holds her phone she hikes her shouldes and internally rotates. She was advised for now she may have to limit her phone time to within pain free limits. He legs have been progressivly weaking for several years now. She reports it is to the point where if she stands for more then 10-15 minutes she feels as though her knees will buckle. She has a histroy of low back and knee  pain but neither are effecting her at this time. She has a slow TUG and requires increased trime for sit to stand test. She would benefit from skilled therapy for a general conditioning program as well as a postrual correction program. She has a high co-pay. She will be seen for 2-3 visits to build her a program that she can use and progress to improve her general strength and fitness. She is motiated to improve her ability to perfrom funtional tasks.    Personal Factors and Comorbidities  Comorbidity 1;Comorbidity 2;Comorbidity 3+;Behavior Pattern    Comorbidities  obesity, dyspnea with exertion, multi joint OA    Examination-Activity Limitations  Squat;Stairs;Stand;Locomotion Level    Examination-Participation Restrictions  Proofreader;Yard Work    Merchant navy officer  Evolving/Moderate complexity   increasing weakness and pain in her shoulders   Rehab Potential  Good    PT Frequency  2x / week    PT Duration  8 weeks    PT Treatment/Interventions  ADLs/Self Care Home Management;Electrical Stimulation;Cryotherapy;Iontophoresis 4mg /ml Dexamethasone;Moist Heat;Ultrasound;DME Instruction;Gait training;Functional mobility training;Stair training;Therapeutic activities;Therapeutic exercise;Neuromuscular re-education;Manual techniques;Passive range of motion;Dry needling;Taping    PT Next Visit Plan  general strengthening: consider standing hip exercises; consider supine quad sets, heel slides, consider shoulder extensions, scap retractions, consdier general stretching, encourage walking and aerobic exercises. Patient will onyl have 2-3 visits so lets buoild her a strong do-able plan.    PT Home Exercise Plan  laq 90-45, hip abdution band, bilateral ER, shoulder abduction    Consulted and Agree with Plan of Care  Patient       Patient will benefit from skilled therapeutic intervention in order to improve the following deficits and impairments:  Abnormal gait,  Decreased mobility, Difficulty walking, Decreased activity tolerance, Decreased strength  Visit Diagnosis: Chronic left shoulder pain  Chronic right shoulder pain  Chronic pain of left knee  Chronic pain of right knee  Other abnormalities of gait and mobility     Problem List Patient Active Problem List   Diagnosis Date Noted  . Medication management 12/01/2018  . Shortness of breath 12/01/2018  . Former smoker 12/01/2018  . Atrial fibrillation, rapid ([place]) 04/29/2018  . COPD exacerbation ([place]) 04/29/2018  . Acute gastroenteritis 03/23/2018  . GERD (gastroesophageal reflux disease) 01/03/2018  . Proteinuria 09/15/2017  . Insomnia 03/17/2017  . Rash 11/04/2016  . Hematoma 11/04/2016  . Pre-operative examination for internal medicine 09/11/2016  . S/P lumbar laminectomy 03/19/2016  . Overactive bladder 10/20/2015  . Pulmonary HTN ([place]) 06/19/2015  . Mild obstructive sleep apnea 06/19/2015  . Snoring 06/19/2015  . Abdominal pain 04/18/2015  . Cough 03/08/2015  . A-fib ([place]) 02/05/2014  . Preop cardiovascular exam 02/05/2014  . Arthritis of knee 02/05/2014  . Arm pain, right 08/31/2013  . Low back pain 09/26/2011  . Leg pain 07/07/2011  . Dizziness 07/07/2011  . Menopausal disorder 07/07/2011  . Hemorrhage of rectum and anus 11/04/2010  . Internal thrombosed hemorrhoids 11/04/2010  . Internal hemorrhoids with other complication [id number]  . External hemorrhoids without mention of complication [id number]  . Dyspepsia 09/24/2010  . Peripheral edema 09/24/2010  . Diabetes ([place]) 09/21/2010  . Encounter for preventative adult health care exam with abnormal findings 09/21/2010  . IBS 06/20/2009  . HEMATOCHEZIA 06/20/2009  . Elevated lipids 08/16/2008  . Anxiety state 08/16/2008  . Depression 08/16/2008  . Allergic rhinitis 08/16/2008  . ASTHMA 08/16/2008  . COPD (chronic obstructive pulmonary disease) ([place]) 08/16/2008  . GENITAL HERPES 12/03/2006  .  Hypothyroidism 12/03/2006  . Essential hypertension 12/03/2006    Carney Living PT DPT  12/13/2018, 11:46 AM  [facility] [address] [place], [place], [address] Phone: [phone]   Fax:  [phone]  Name: Andrea Perry MRN: [id number] Date of Birth: Apr 18, 1951

## 2018-12-14 ENCOUNTER — Other Ambulatory Visit: Payer: Self-pay

## 2018-12-14 NOTE — Patient Outreach (Signed)
[facility]) Care Management  12/14/2018  Andrea Perry AGE [birth year] [id number]   Telephone Screen  Referral Date:12/01/2018 Referral Source:MD Office Referral Reason:"med mgmt-cost of Stiolto & Eliquis" Insurance:BCBS Medicare   Multiple attempts to establish contact with patient without success. No response from letter mailed to patient. Case is being closed at this time.     Plan: RN CM will close case at this time. RN CM will send MD case closure letter.   Enzo Montgomery, RN,BSN,CCM [facility] Management Telephonic Care Management Coordinator Direct Phone: [phone] Toll Free: [phone] Fax: [phone]

## 2018-12-21 ENCOUNTER — Telehealth: Payer: Self-pay | Admitting: Physical Therapy

## 2018-12-21 NOTE — Telephone Encounter (Signed)
Spoke with patient regarding missed visit. The patient reports she just forgot. She will be there this Friday.

## 2018-12-22 ENCOUNTER — Encounter (HOSPITAL_COMMUNITY): Payer: Self-pay | Admitting: Emergency Medicine

## 2018-12-22 ENCOUNTER — Emergency Department (HOSPITAL_COMMUNITY)
Admission: EM | Admit: 2018-12-22 | Discharge: 2018-12-22 | Disposition: A | Discharge status: Home / Self Care | Payer: Medicare Other | Attending: Emergency Medicine | Admitting: Emergency Medicine

## 2018-12-22 ENCOUNTER — Emergency Department (HOSPITAL_COMMUNITY): Payer: Medicare Other

## 2018-12-22 ENCOUNTER — Other Ambulatory Visit: Payer: Self-pay

## 2018-12-22 DIAGNOSIS — Y999 Unspecified external cause status: Secondary | ICD-10-CM | POA: Diagnosis not present

## 2018-12-22 DIAGNOSIS — Y939 Activity, unspecified: Secondary | ICD-10-CM | POA: Diagnosis not present

## 2018-12-22 DIAGNOSIS — S161XXA Strain of muscle, fascia and tendon at neck level, initial encounter: Secondary | ICD-10-CM | POA: Diagnosis not present

## 2018-12-22 DIAGNOSIS — M542 Cervicalgia: Secondary | ICD-10-CM | POA: Diagnosis not present

## 2018-12-22 DIAGNOSIS — Z85828 Personal history of other malignant neoplasm of skin: Secondary | ICD-10-CM | POA: Diagnosis not present

## 2018-12-22 DIAGNOSIS — Z8709 Personal history of other diseases of the respiratory system: Secondary | ICD-10-CM | POA: Insufficient documentation

## 2018-12-22 DIAGNOSIS — I10 Essential (primary) hypertension: Secondary | ICD-10-CM | POA: Insufficient documentation

## 2018-12-22 DIAGNOSIS — Y929 Unspecified place or not applicable: Secondary | ICD-10-CM | POA: Insufficient documentation

## 2018-12-22 DIAGNOSIS — Z87891 Personal history of nicotine dependence: Secondary | ICD-10-CM | POA: Diagnosis not present

## 2018-12-22 DIAGNOSIS — X58XXXA Exposure to other specified factors, initial encounter: Secondary | ICD-10-CM | POA: Diagnosis not present

## 2018-12-22 MED ORDER — KETOROLAC TROMETHAMINE 30 MG/ML IJ SOLN
30.0000 mg | Freq: Once | INTRAMUSCULAR | Status: AC
  Administered 2018-12-22: 30 mg via INTRAMUSCULAR
  Filled 2018-12-22: qty 1

## 2018-12-22 MED ORDER — TIZANIDINE HCL 2 MG PO TABS: 2.0000 mg | ORAL_TABLET | Freq: Four times a day (QID) | ORAL | 0 refills | Status: DC | PRN

## 2018-12-22 NOTE — ED Provider Notes (Signed)
[facility] Emergency Department Provider Note MRN:  [id number]  Arrival date & time: 12/22/18     Chief Complaint   Neck Pain   History of Present Illness   Andrea Perry is a 68 y.o. year-old female with a history of hypertension presenting to the ED with chief complaint of neck pain.  Location: Right lateral neck Duration: 2 days Onset: Gradual Timing: Constant, worse this morning upon awakening Description: Sharp/pulling sensation Severity: Moderate to severe Exacerbating/Alleviating Factors: Worse with movement, specifically when turning head to the right Associated Symptoms: None Pertinent Negatives: Denies numbness or weakness to the arms or legs, no bowel or bladder dysfunction, no fever, no cough, no chest pain or shortness of breath, no abdominal pain, no dysuria.   Review of Systems  A complete 10 system review of systems was obtained and all systems are negative except as noted in the HPI and PMH.   Patient's Health History    Past Medical History:  Diagnosis Date  . ALLERGIC RHINITIS 08/16/2008  . ANXIETY 08/16/2008  . Arthritis    hands, knees, lower back  . ASTHMA 08/16/2008  . Asthma    BRONCHITIS     DR. Lamonte Sakai    . Bladder leak   . Cancer ([place])    MELANOMA      . Chronic lower back pain    problems with disc L2-5  . COPD 08/16/2008  . DEPRESSION 08/16/2008  . Dysrhythmia    hx AF  . Excessive daytime sleepiness 06/19/2015  . GENITAL HERPES 12/03/2006  . GERD (gastroesophageal reflux disease)   . H/O hiatal hernia   . HEMATOCHEZIA 06/20/2009  . Hemorrhoids   . History of cardioversion 10/30/14  . History of kidney stones   . HYPERLIPIDEMIA 08/16/2008  . HYPERTENSION 12/03/2006  . Hypertension   . Impaired glucose tolerance 09/21/2010  . Overactive bladder 10/20/2015  . Pneumonia    as a child  . Pulmonary HTN ([place]) 06/19/2015  . Shortness of breath dyspnea   . Sleep apnea    MILD NO CPAP ORDERED  . Snoring 06/19/2015    Past  Surgical History:  Procedure Laterality Date  . ABDOMINAL HYSTERECTOMY    . APPENDECTOMY    . CARDIOVERSION N/A 03/20/2014   Procedure: CARDIOVERSION;  Surgeon: Josue Hector, MD;  Location: [facility] ENDOSCOPY;  Service: Cardiovascular;  Laterality: N/A;  . CARDIOVERSION N/A 10/30/2014   Procedure: CARDIOVERSION;  Surgeon: Josue Hector, MD;  Location: [facility] ENDOSCOPY;  Service: Cardiovascular;  Laterality: N/A;  . CARPAL TUNNEL RELEASE     Right + LEFT  . CESAREAN SECTION     x 1   . COLONOSCOPY  2004  . CYST EXCISION     RT ARM   . CYSTOCELE REPAIR N/A 10/25/2012   Procedure: ANTERIOR REPAIR (CYSTOCELE);  Surgeon: Gus Height, MD;  Location: [place] [facility];  Service: Gynecology;  Laterality: N/A;  . HEEL SPUR SURGERY Bilateral   . KNEE SURGERY     Right + LEFT  . LUMBAR LAMINECTOMY/DECOMPRESSION MICRODISCECTOMY N/A 03/19/2016   Procedure: Laminectomy and Foraminotomy - Lumbar three-four - Lumbar four-five;  Surgeon: Eustace Moore, MD;  Location: [place];  Service: Neurosurgery;  Laterality: N/A;  . RADIOLOGY WITH ANESTHESIA Right 11/01/2014   Procedure: MRI RIGHT FOREARM;  Surgeon: Medication Radiologist, MD;  Location: [place];  Service: Radiology;  Laterality: Right;  . RADIOLOGY WITH ANESTHESIA Right 08/29/2015   Procedure: MRI - RIGHT FOREARM;  Surgeon: Medication Radiologist, MD;  Location: [place];  Service: Radiology;  Laterality: Right;  . RADIOLOGY WITH ANESTHESIA N/A 12/05/2015   Procedure: MRI SPINE WITHOUT;  Surgeon: Medication Radiologist, MD;  Location: [place];  Service: Radiology;  Laterality: N/A;  . SKIN CANCER EXCISION     BILAT SHOULDERS  . SPLIT NIGHT STUDY  09/04/2015  . TONSILLECTOMY AND ADENOIDECTOMY      Family History  Problem Relation Age of Onset  . Diabetes Mother   . Hyperlipidemia Mother   . Hypertension Mother   . Colon polyps Mother   . Stroke Father   . Colon cancer Neg Hx     Social History   Socioeconomic History  . Marital status: Widowed    Spouse name: Not on  file  . Number of children: 1  . Years of education: Not on file  . Highest education level: Not on file  Occupational History  . Occupation: currently unemployed, former [employer] education [employer]  Social Needs  . Financial resource strain: Not on file  . Food insecurity    Worry: Not on file    Inability: Not on file  . Transportation needs    Medical: Not on file    Non-medical: Not on file  Tobacco Use  . Smoking status: Former Smoker    Packs/day: 1.50    Years: 30.00    Pack years: 45.00    Types: Cigarettes    Quit date: 04/21/2003    Years since quitting: 15.6  . Smokeless tobacco: Never Used  Substance and Sexual Activity  . Alcohol use: Yes    Comment: OCCAS  . Drug use: No  . Sexual activity: Not Currently    Birth control/protection: None  Lifestyle  . Physical activity    Days per week: Not on file    Minutes per session: Not on file  . Stress: Not on file  Relationships  . Social Herbalist on phone: Not on file    Gets together: Not on file    Attends religious service: Not on file    Active member of club or organization: Not on file    Attends meetings of clubs or organizations: Not on file    Relationship status: Not on file  . Intimate partner violence    Fear of current or ex partner: Not on file    Emotionally abused: Not on file    Physically abused: Not on file    Forced sexual activity: Not on file  Other Topics Concern  . Not on file  Social History Narrative   3 caffeine drinks daily      Physical Exam  Vital Signs and Nursing Notes reviewed Vitals:   12/22/18 0741 12/22/18 0955  BP: 96/68 116/66  Pulse: (!) 118 74  Resp: 18 18  Temp: 99.3 F (37.4 C)   SpO2: 100% 96%    CONSTITUTIONAL: Well-appearing, NAD NEURO:  Alert and oriented x 3, normal and symmetric strength and sensation, normal coordination, normal speech EYES:  eyes equal and reactive ENT/NECK:  no LAD, no JVD CARDIO: Regular rate, well-perfused, normal  S1 and S2 PULM:  CTAB no wheezing or rhonchi GI/GU:  normal bowel sounds, non-distended, non-tender MSK/SPINE:  No gross deformities, no edema; focal tenderness to palpation to the right lateral musculature of the neck SKIN:  no rash, atraumatic PSYCH:  Appropriate speech and behavior  Diagnostic and Interventional Summary    Labs Reviewed - No data to display  DG Cervical Spine Complete  Final Result      Medications  ketorolac (TORADOL) 30 MG/ML injection 30 mg (30 mg Intramuscular Given 12/22/18 [id number])     Procedures Critical Care  ED Course and Medical Decision Making  I have reviewed the triage vital signs and the nursing notes.  Pertinent labs & imaging results that were available during my care of the patient were reviewed by me and considered in my medical decision making (see below for details).  Consistent with MSK neck strain or sprain, no symptoms to suggest myelopathy, no signs of infection on skin exam, will provide Toradol here, muscle relaxers at home.  Barth Kirks. [place] Small, MD [place] [email or web address]  Final Clinical Impressions(s) / ED Diagnoses     ICD-10-CM   1. Acute strain of neck muscle, initial encounter  S16.1XXA     ED Discharge Orders         Ordered    tiZANidine (ZANAFLEX) 2 MG tablet  Every 6 hours PRN     12/22/18 1011          Discharge Instructions Discussed with and Provided to Patient:   Discharge Instructions     You were evaluated in the Emergency Department and after careful evaluation, we did not find any emergent condition requiring admission or further testing in the hospital.  Your exam/testing today is overall reassuring.  Your pain seems to be related to muscle spasm or strain in the neck.  Please use Tylenol at home for discomfort.  You can also use the muscle relaxer medication provided though please use caution as this medication can cause drowsiness.  Please return to  the Emergency Department if you experience any worsening of your condition.  We encourage you to follow up with a primary care provider.  Thank you for allowing [place] to be a part of your care.       Maudie Flakes, MD 12/22/18 1013

## 2018-12-22 NOTE — Discharge Instructions (Addendum)
You were evaluated in the Emergency Department and after careful evaluation, we did not find any emergent condition requiring admission or further testing in the hospital.  Your exam/testing today is overall reassuring.  Your pain seems to be related to muscle spasm or strain in the neck.  Please use Tylenol at home for discomfort.  You can also use the muscle relaxer medication provided though please use caution as this medication can cause drowsiness.  Please return to the Emergency Department if you experience any worsening of your condition.  We encourage you to follow up with a primary care provider.  Thank you for allowing [place] to be a part of your care.

## 2018-12-22 NOTE — ED Triage Notes (Signed)
Pt reports right neck pain for the 3 days. Denies recent injury. Denies weakness, numbness in extremities. Pt reports painful to move her neck. Pt awake, alert, appropriate, VSS.

## 2018-12-23 ENCOUNTER — Ambulatory Visit: Payer: Medicare Other | Admitting: Physical Therapy

## 2019-01-01 ENCOUNTER — Other Ambulatory Visit: Payer: Self-pay | Admitting: Cardiovascular Disease

## 2019-01-01 ENCOUNTER — Other Ambulatory Visit: Payer: Self-pay | Admitting: Internal Medicine

## 2019-01-06 ENCOUNTER — Telehealth: Payer: Self-pay | Admitting: *Deleted

## 2019-01-06 DIAGNOSIS — M67912 Unspecified disorder of synovium and tendon, left shoulder: Secondary | ICD-10-CM

## 2019-01-06 NOTE — Telephone Encounter (Signed)
Order placed for PT 

## 2019-01-16 ENCOUNTER — Other Ambulatory Visit: Payer: Self-pay | Admitting: Cardiovascular Disease

## 2019-01-17 ENCOUNTER — Encounter: Payer: Self-pay | Admitting: Sports Medicine

## 2019-01-17 ENCOUNTER — Ambulatory Visit (INDEPENDENT_AMBULATORY_CARE_PROVIDER_SITE_OTHER): Payer: Medicare Other | Admitting: Sports Medicine

## 2019-01-17 ENCOUNTER — Other Ambulatory Visit: Payer: Self-pay

## 2019-01-17 VITALS — BP 112/72 | Ht 62.0 in | Wt 265.0 lb

## 2019-01-17 DIAGNOSIS — M25511 Pain in right shoulder: Secondary | ICD-10-CM

## 2019-01-17 DIAGNOSIS — M25512 Pain in left shoulder: Secondary | ICD-10-CM | POA: Diagnosis not present

## 2019-01-17 NOTE — Progress Notes (Signed)
   Subjective:    Patient ID: Andrea Perry, female    DOB: 07-08-1950, 68 y.o.   MRN: [id number]  HPI   Andrea Perry comes in today for follow-up on left shoulder pain.  Subacromial injection was minimally effective.  She was only able to attend 1 physical therapy session but has a second session scheduled in a few days.  She is now experiencing similar pain in the right shoulder.  No trauma here.  She is asking about the possibility of an oral anti-inflammatory.  She takes Tylenol with minimal symptom relief.  Interim medical history and current medications reviewed.  She is a newly diagnosed diabetic.   Review of Systems    As above Objective:   Physical Exam  Well-developed, well-nourished.  No acute distress.  Awake alert and oriented x3.  Vital signs reviewed  Examination of both shoulder shows limited range of motion in all planes secondary to pain.  No tenderness to palpation.  Good strength.  Pulses intact distally.      Assessment & Plan:   Bilateral shoulder pain likely secondary to rotator cuff tendinopathy with underlying DJD Newly diagnosed diabetes  Patient is currently on Eliquis which makes oral anti-inflammatories contraindicated.  I do think topical Voltaren would be safe to try.  I also think she would benefit from more physical therapy.  If she continues to struggle then I would consider updated imaging.  Previous x-rays of both shoulders showed moderate degenerative changes.

## 2019-01-17 NOTE — Patient Instructions (Signed)
  Good seeing you today. Try getting some Voltaren gel at the drugstore and using that on your knees and shoulders. Ice will help as well. I think physical therapy will be beneficial.  Get a few sessions under your belt and see how you feel.  If things are not going well just call me and I can order x-rays or possibly an MRI if needed.

## 2019-01-19 ENCOUNTER — Other Ambulatory Visit: Payer: Self-pay

## 2019-01-19 ENCOUNTER — Ambulatory Visit: Payer: Medicare Other | Attending: Sports Medicine | Admitting: Physical Therapy

## 2019-01-19 ENCOUNTER — Encounter: Payer: Self-pay | Admitting: Physical Therapy

## 2019-01-19 DIAGNOSIS — R2689 Other abnormalities of gait and mobility: Secondary | ICD-10-CM | POA: Diagnosis not present

## 2019-01-19 DIAGNOSIS — M6281 Muscle weakness (generalized): Secondary | ICD-10-CM | POA: Diagnosis not present

## 2019-01-19 DIAGNOSIS — G8929 Other chronic pain: Secondary | ICD-10-CM | POA: Insufficient documentation

## 2019-01-19 DIAGNOSIS — M25511 Pain in right shoulder: Secondary | ICD-10-CM | POA: Insufficient documentation

## 2019-01-19 DIAGNOSIS — M25512 Pain in left shoulder: Secondary | ICD-10-CM | POA: Insufficient documentation

## 2019-01-19 NOTE — Therapy (Signed)
[facility] [place], [place], [address] Phone: [phone]   Fax:  [phone]  Physical Therapy Treatment/Re-evaluation  Patient Details  Name: Andrea Perry MRN: [id number] Date of Birth: 05/05/1950 Referring Provider (PT): Lilia Argue, DO   Encounter Date: 01/19/2019  PT End of Session - 01/19/19 1302    Visit Number  2    Number of Visits  13    Date for PT Re-Evaluation  03/02/19    Authorization Type  [employer] Medicare    PT Start Time  1151    PT Stop Time  1230    PT Time Calculation (min)  39 min    Activity Tolerance  Patient tolerated treatment well    Behavior During Therapy  [facility] for tasks assessed/performed       Past Medical History:  Diagnosis Date  . ALLERGIC RHINITIS 08/16/2008  . ANXIETY 08/16/2008  . Arthritis    hands, knees, lower back  . ASTHMA 08/16/2008  . Asthma    BRONCHITIS     DR. Lamonte Sakai    . Bladder leak   . Cancer ([place])    MELANOMA      . Chronic lower back pain    problems with disc L2-5  . COPD 08/16/2008  . DEPRESSION 08/16/2008  . Diabetes mellitus without complication ([place])   . Dysrhythmia    hx AF  . Excessive daytime sleepiness 06/19/2015  . GENITAL HERPES 12/03/2006  . GERD (gastroesophageal reflux disease)   . H/O hiatal hernia   . HEMATOCHEZIA 06/20/2009  . Hemorrhoids   . History of cardioversion 10/30/14  . History of kidney stones   . HYPERLIPIDEMIA 08/16/2008  . HYPERTENSION 12/03/2006  . Hypertension   . Impaired glucose tolerance 09/21/2010  . Overactive bladder 10/20/2015  . Pneumonia    as a child  . Pulmonary HTN ([place]) 06/19/2015  . Shortness of breath dyspnea   . Sleep apnea    MILD NO CPAP ORDERED  . Snoring 06/19/2015    Past Surgical History:  Procedure Laterality Date  . ABDOMINAL HYSTERECTOMY    . APPENDECTOMY    . CARDIOVERSION N/A 03/20/2014   Procedure: CARDIOVERSION;  Surgeon: Josue Hector, MD;  [facility] ENDOSCOPY;  Service: Cardiovascular;   Laterality: N/A;  . CARDIOVERSION N/A 10/30/2014   Procedure: CARDIOVERSION;  Surgeon: Josue Hector, MD;  Location: [facility] ENDOSCOPY;  Service: Cardiovascular;  Laterality: N/A;  . CARPAL TUNNEL RELEASE     Right + LEFT  . CESAREAN SECTION     x 1   . COLONOSCOPY  2004  . CYST EXCISION     RT ARM   . CYSTOCELE REPAIR N/A 10/25/2012   Procedure: ANTERIOR REPAIR (CYSTOCELE);  Surgeon: Gus Height, MD;  Location: [facility];  Service: Gynecology;  Laterality: N/A;  . HEEL SPUR SURGERY Bilateral   . KNEE SURGERY     Right + LEFT  . LUMBAR LAMINECTOMY/DECOMPRESSION MICRODISCECTOMY N/A 03/19/2016   Procedure: Laminectomy and Foraminotomy - Lumbar three-four - Lumbar four-five;  Surgeon: Eustace Moore, MD;  Location: [facility];  Service: Neurosurgery;  Laterality: N/A;  . RADIOLOGY WITH ANESTHESIA Right 11/01/2014   Procedure: MRI RIGHT FOREARM;  Surgeon: Medication Radiologist, MD;  Location: [facility];  Service: Radiology;  Laterality: Right;  . RADIOLOGY WITH ANESTHESIA Right 08/29/2015   Procedure: MRI - RIGHT FOREARM;  Surgeon: Medication Radiologist, MD;  Location: [facility];  Service: Radiology;  Laterality: Right;  . RADIOLOGY WITH ANESTHESIA N/A 12/05/2015  Procedure: MRI SPINE WITHOUT;  Surgeon: Medication Radiologist, MD;  Location: [facility];  Service: Radiology;  Laterality: N/A;  . SKIN CANCER EXCISION     BILAT SHOULDERS  . SPLIT NIGHT STUDY  09/04/2015  . TONSILLECTOMY AND ADENOIDECTOMY      There were no vitals filed for this visit.  Subjective Assessment - 01/19/19 1252    Subjective  Pt. is a 68 y/o female previously seen for therapy evaluation 12/08/18 for left shoulder pain as well as bilat. LE weakness. Therapy plan of care had been set up but pt. had change in status due to neck pain onset around 12/20/18 for which she went to ED and was diagnosed with strain. She reports wanted to hold off on therapy until neck improved (now neck pain mimimal). New PT prescription received from Dr. Micheline Chapman. Pt. has  continued with left shoulder pain and reports rigth side has been bothering her as well.    Pertinent History  diabetic, asthma, COPD, chronic LBP, knee OA, knee sx., heel sx.    Limitations  Lifting;House hold activities;Standing;Walking    How long can you sit comfortably?  stiffens when she sits    How long can you stand comfortably?  < 5 min    How long can you walk comfortably?  limited distance    Diagnostic tests  X-rays: moderate to severe try compartmental OA    Patient Stated Goals  to have less pain with gait    Currently in Pain?  Yes    Pain Score  5     Pain Location  Shoulder    Pain Orientation  Left    Pain Descriptors / Indicators  Aching    Pain Type  Chronic pain    Pain Onset  More than a month ago    Pain Frequency  Intermittent    Aggravating Factors   reaching activities, lifting    Pain Relieving Factors  rest    Effect of Pain on Daily Activities  limits ability reaching for ADLs/IADLs         [facility] PT Assessment - 01/19/19 0001      Assessment   Medical Diagnosis  Bilateral shoulder pain    Referring Provider (PT)  Lilia Argue, DO    Onset Date/Surgical Date  --   Several years    Hand Dominance  Right    Prior Therapy  Years ago for her knees and back       Observation/Other Assessments   Focus on Therapeutic Outcomes (FOTO)   44% limited      Sit to Stand   Comments  14 sec 5x      Other:   Other/ Comments  TUG 18.5 sec      AROM   Right Shoulder Flexion  95 Degrees    Right Shoulder ABduction  95 Degrees    Right Shoulder Internal Rotation  --   reach to sacrum   Right Shoulder External Rotation  --   reach to suboccipitals   Left Shoulder Flexion  130 Degrees    Left Shoulder ABduction  95 Degrees    Left Shoulder Internal Rotation  --   reach to L2   Left Shoulder External Rotation  --   reach to C7     Strength   Right Shoulder Flexion  4/5    Right Shoulder ABduction  4/5    Right Shoulder Internal Rotation  4+/5     Right Shoulder External Rotation  4/5  Left Shoulder Flexion  4/5    Left Shoulder ABduction  4/5    Left Shoulder Internal Rotation  5/5    Left Shoulder External Rotation  4+/5    Right Hip Flexion  4/5    Right Hip ABduction  4/5    Right Hip ADduction  4/5    Left Hip Flexion  4/5    Left Hip ABduction  4/5    Left Hip ADduction  4/5    Right Knee Flexion  4/5    Right Knee Extension  4/5    Left Knee Flexion  4/5    Left Knee Extension  4/5                   [facility] Adult PT Treatment/Exercise - 01/19/19 0001      Exercises   Exercises  Shoulder      Lumbar Exercises: Seated   LAQ on Chair Limitations  x10 ea. bilat. 90-45 with red band    Other Seated Lumbar Exercises  x10 with green band      Shoulder Exercises: Seated   External Rotation Limitations  bilat. UE with yellow band performed in sitting today x 10 reps    Internal Rotation  --    Theraband Level (Shoulder Internal Rotation)  --      Shoulder Exercises: Standing   External Rotation Limitations  see seated shoulder exercises    Internal Rotation  AROM;Strengthening;Both;10 reps    Theraband Level (Shoulder Internal Rotation)  Level 2 (Red)    Row  AROM;Strengthening;Both;10 reps    Theraband Level (Shoulder Row)  Level 2 (Red)             PT Education - 01/19/19 1301    Education Details  HEP, POC, symptom etiology and shoulder anatomy    Person(s) Educated  Patient    Methods  Explanation;Demonstration;Tactile cues;Verbal cues;Handout    Comprehension  Returned demonstration;Verbalized understanding;Need further instruction;Verbal cues required;Tactile cues required       PT Short Term Goals - 12/09/18 0714      PT SHORT TERM GOAL #1   Title  STG =LTG        PT Long Term Goals - 01/19/19 1309      PT LONG TERM GOAL #1   Title  Patient will decrease TUG time to 14 sec or less to decrease fall risk    Baseline  18.5 sec    Time  6    Period  Weeks    Status  On-going     Target Date  03/02/19      PT LONG TERM GOAL #2   Title  Patient will increase bilat. active left shoulder flexion to 135 degrees in order to perfrom IADl's    Baseline  right 95 deg, left 130 deg    Time  6    Period  Weeks    Status  Revised    Target Date  03/02/19      PT LONG TERM GOAL #3   Title  Patient will perfrom sit to stand test in 10 seconds in order to demonstrate improved functional mobility    Baseline  14 sec    Time  6    Period  Weeks    Status  On-going    Target Date  03/02/19      PT LONG TERM GOAL #4   Title  Patient will be independent with UE/LE strength and conditioning program    Baseline  instructed in basic HEP today-will update/progress prn    Time  6    Period  Weeks    Status  On-going    Target Date  03/02/19      PT LONG TERM GOAL #5   Title  Increase UE/LE strength grossly 1/2 MMT grade from current status to improve ability for lifting for chores/carrying groceries, improved ability sit>stand from low chairs and improved safety with gait    Baseline  see objective    Time  6    Period  Weeks    Status  New    Target Date  03/02/19            Plan - 01/19/19 1303    Clinical Impression Statement  Pt. returns after absence from therapy due to neck pain onset (now improved) with continued c/o bilat. shoulder pain and LE weakness. Shoulder symptoms consistent with rotator cuff tendinopathy with underlying degenerative changes with muscle weakness and decreased shoulder ROM, pain and limitation in abduction. Pt. also presents with general LE weakness with underlying knee OA. TUG score suggestive of increased fall risk. Pt. would benefit from resumption/continuation of PT to help relieve pain and address current associated functional limitations for reaching ADLs and community mobility.    Personal Factors and Comorbidities  Comorbidity 1;Comorbidity 2;Comorbidity 3+;Behavior Pattern    Comorbidities  obesity, dyspnea with exertion, multi  joint OA    Examination-Activity Limitations  Squat;Stairs;Stand;Locomotion Level    Examination-Participation Restrictions  Proofreader;Yard Work    Stability/Clinical Decision Making  Evolving/Moderate complexity    Clinical Decision Making  Moderate    Rehab Potential  Good    PT Frequency  --   1-2x/week   PT Duration  6 weeks    PT Treatment/Interventions  ADLs/Self Care Home Management;Electrical Stimulation;Cryotherapy;Iontophoresis 4mg /ml Dexamethasone;Moist Heat;Ultrasound;DME Instruction;Gait training;Functional mobility training;Stair training;Therapeutic activities;Therapeutic exercise;Neuromuscular re-education;Manual techniques;Passive range of motion;Dry needling;Taping;Patient/family education    PT Next Visit Plan  NUSTEP warm up UE/LE, for shoulders review HEP as needed and continue with rotator cuff, shoulder strengthening as tolerated. For legs general strengthening within tolerance due to knee pain with open and closed chain activities.    PT Home Exercise Plan  Shoulder: Theraband ER, IR, row, alternating "punch". For legs sit<>stand, heel raises, hip abd in sitting, LAQ 90-45 deg    Consulted and Agree with Plan of Care  Patient       Patient will benefit from skilled therapeutic intervention in order to improve the following deficits and impairments:  Abnormal gait, Decreased mobility, Difficulty walking, Decreased activity tolerance, Decreased strength, Impaired UE functional use, Postural dysfunction, Pain, Decreased range of motion  Visit Diagnosis: Chronic left shoulder pain  Chronic right shoulder pain  Muscle weakness (generalized)  Other abnormalities of gait and mobility     Problem List Patient Active Problem List   Diagnosis Date Noted  . Medication management 12/01/2018  . Shortness of breath 12/01/2018  . Former smoker 12/01/2018  . Atrial fibrillation, rapid ([place]) 04/29/2018  . COPD exacerbation ([place]) 04/29/2018   . Acute gastroenteritis 03/23/2018  . GERD (gastroesophageal reflux disease) 01/03/2018  . Proteinuria 09/15/2017  . Insomnia 03/17/2017  . Rash 11/04/2016  . Hematoma 11/04/2016  . Pre-operative examination for internal medicine 09/11/2016  . S/P lumbar laminectomy 03/19/2016  . Overactive bladder 10/20/2015  . Pulmonary HTN ([place]) 06/19/2015  . Mild obstructive sleep apnea 06/19/2015  . Snoring 06/19/2015  . Abdominal pain 04/18/2015  . Cough 03/08/2015  . A-fib ([place]) 02/05/2014  .  Preop cardiovascular exam 02/05/2014  . Arthritis of knee 02/05/2014  . Arm pain, right 08/31/2013  . Low back pain 09/26/2011  . Leg pain 07/07/2011  . Dizziness 07/07/2011  . Menopausal disorder 07/07/2011  . Hemorrhage of rectum and anus 11/04/2010  . Internal thrombosed hemorrhoids 11/04/2010  . Internal hemorrhoids with other complication 123456  . External hemorrhoids without mention of complication 123456  . Dyspepsia 09/24/2010  . Peripheral edema 09/24/2010  . Diabetes ([place]) 09/21/2010  . Encounter for preventative adult health care exam with abnormal findings 09/21/2010  . IBS 06/20/2009  . HEMATOCHEZIA 06/20/2009  . Elevated lipids 08/16/2008  . Anxiety state 08/16/2008  . Depression 08/16/2008  . Allergic rhinitis 08/16/2008  . ASTHMA 08/16/2008  . COPD (chronic obstructive pulmonary disease) ([place]) 08/16/2008  . GENITAL HERPES 12/03/2006  . Hypothyroidism 12/03/2006  . Essential hypertension 12/03/2006    Beaulah Dinning, PT, DPT 01/19/19 1:18 PM  [facility] [address] [place], [place], [address] Phone: [phone]   Fax:  [phone]  Name: Andrea Perry MRN: [id number] Date of Birth: 1951/02/01

## 2019-01-24 ENCOUNTER — Other Ambulatory Visit: Payer: Self-pay | Admitting: Emergency Medicine

## 2019-01-24 ENCOUNTER — Encounter: Payer: Self-pay | Admitting: Internal Medicine

## 2019-01-24 ENCOUNTER — Ambulatory Visit (INDEPENDENT_AMBULATORY_CARE_PROVIDER_SITE_OTHER): Payer: Medicare Other | Admitting: Internal Medicine

## 2019-01-24 DIAGNOSIS — J441 Chronic obstructive pulmonary disease with (acute) exacerbation: Secondary | ICD-10-CM | POA: Diagnosis not present

## 2019-01-24 MED ORDER — PREDNISONE 10 MG PO TABS: ORAL_TABLET | ORAL | 0 refills | Status: DC

## 2019-01-24 MED ORDER — HYDROCODONE-HOMATROPINE 5-1.5 MG/5ML PO SYRP: 5.0000 mL | ORAL_SOLUTION | Freq: Four times a day (QID) | ORAL | 0 refills | Status: AC | PRN

## 2019-01-24 MED ORDER — AZITHROMYCIN 250 MG PO TABS: ORAL_TABLET | ORAL | Status: DC

## 2019-01-24 NOTE — Patient Instructions (Signed)
Please take all new medication as prescribed - the antibiotic, cough medicine, and prednisone  Please continue all other medications as before, and refills have been done if requested.  Please have the pharmacy call with any other refills you may need.  Please continue your efforts at being more active, low cholesterol diet, and weight control.  Please keep your appointments with your specialists as you may have planned     

## 2019-01-24 NOTE — Progress Notes (Signed)
Patient ID: Andrea Perry, female   DOB: Jan 26, 1951, 68 y.o.   MRN: [id number]   Phone visit  Cumulative time during 7-day interval 13 min, there was not an associated office visit for this concern within a 7 day period.  Verbal consent for services obtained from patient prior to services given.  Names of all persons present for services: Cathlean Cower, MD, patient  Chief complaint: sob  History, background, results pertinent:  Here with 4 days onset non prod cough, wheezing, sob/doe with fever, productive cough, and Pt denies chest pain, orthopnea, PND, increased LE swelling, palpitations, dizziness or syncope.  Inhaler helps somewhat but only for 1 hr.  Pt denies polydipsia, polyuria.  Pt denies fever, wt loss, night sweats, loss of appetite, or other constitutional symptoms  Past Medical History:  Diagnosis Date  . ALLERGIC RHINITIS 08/16/2008  . ANXIETY 08/16/2008  . Arthritis    hands, knees, lower back  . ASTHMA 08/16/2008  . Asthma    BRONCHITIS     DR. Lamonte Sakai    . Bladder leak   . Cancer ([place])    MELANOMA      . Chronic lower back pain    problems with disc L2-5  . COPD 08/16/2008  . DEPRESSION 08/16/2008  . Diabetes mellitus without complication ([place])   . Dysrhythmia    hx AF  . Excessive daytime sleepiness 06/19/2015  . GENITAL HERPES 12/03/2006  . GERD (gastroesophageal reflux disease)   . H/O hiatal hernia   . HEMATOCHEZIA 06/20/2009  . Hemorrhoids   . History of cardioversion 10/30/14  . History of kidney stones   . HYPERLIPIDEMIA 08/16/2008  . HYPERTENSION 12/03/2006  . Hypertension   . Impaired glucose tolerance 09/21/2010  . Overactive bladder 10/20/2015  . Pneumonia    as a child  . Pulmonary HTN ([place]) 06/19/2015  . Shortness of breath dyspnea   . Sleep apnea    MILD NO CPAP ORDERED  . Snoring 06/19/2015   No results found for this or any previous visit (from the past 48 hour(s)).  A/P/next steps:   Asthmatic bronchitis - for antibx, cough med prn, prednisone and contd  inhaler prn,  to f/u any worsening symptoms or concerns  Cathlean Cower MD

## 2019-01-27 ENCOUNTER — Other Ambulatory Visit: Payer: Self-pay

## 2019-01-27 ENCOUNTER — Ambulatory Visit: Payer: Medicare Other | Admitting: Physical Therapy

## 2019-01-27 ENCOUNTER — Encounter: Payer: Self-pay | Admitting: Physical Therapy

## 2019-01-27 DIAGNOSIS — G8929 Other chronic pain: Secondary | ICD-10-CM

## 2019-01-27 DIAGNOSIS — M25511 Pain in right shoulder: Secondary | ICD-10-CM | POA: Diagnosis not present

## 2019-01-27 DIAGNOSIS — M25512 Pain in left shoulder: Secondary | ICD-10-CM | POA: Diagnosis not present

## 2019-01-27 DIAGNOSIS — R2689 Other abnormalities of gait and mobility: Secondary | ICD-10-CM

## 2019-01-27 DIAGNOSIS — M6281 Muscle weakness (generalized): Secondary | ICD-10-CM

## 2019-01-27 NOTE — Therapy (Signed)
[place], [place], [address] Phone: [phone]   Fax:  [phone]  Physical Therapy Treatment  Patient Details  Name: Andrea Perry MRN: [id number] Date of Birth: [birth year] Referring Provider (PT): Lilia Argue, DO   Encounter Date: 01/27/2019  PT End of Session - 01/27/19 0840    Visit Number  3    Number of Visits  13    Date for PT Re-Evaluation  03/02/19    Authorization Type  [employer] Medicare    PT Start Time  0800    PT Stop Time  0840    PT Time Calculation (min)  40 min    Activity Tolerance  Patient limited by fatigue    Behavior During Therapy  [facility] for tasks assessed/performed       Past Medical History:  Diagnosis Date  . ALLERGIC RHINITIS 08/16/2008  . ANXIETY 08/16/2008  . Arthritis    hands, knees, lower back  . ASTHMA 08/16/2008  . Asthma    BRONCHITIS     DR. Lamonte Sakai    . Bladder leak   . Cancer ([place])    MELANOMA      . Chronic lower back pain    problems with disc L2-5  . COPD 08/16/2008  . DEPRESSION 08/16/2008  . Diabetes mellitus without complication ([place])   . Dysrhythmia    hx AF  . Excessive daytime sleepiness 06/19/2015  . GENITAL HERPES 12/03/2006  . GERD (gastroesophageal reflux disease)   . H/O hiatal hernia   . HEMATOCHEZIA 06/20/2009  . Hemorrhoids   . History of cardioversion 10/30/14  . History of kidney stones   . HYPERLIPIDEMIA 08/16/2008  . HYPERTENSION 12/03/2006  . Hypertension   . Impaired glucose tolerance 09/21/2010  . Overactive bladder 10/20/2015  . Pneumonia    as a child  . Pulmonary HTN ([place]) 06/19/2015  . Shortness of breath dyspnea   . Sleep apnea    MILD NO CPAP ORDERED  . Snoring 06/19/2015    Past Surgical History:  Procedure Laterality Date  . ABDOMINAL HYSTERECTOMY    . APPENDECTOMY    . CARDIOVERSION N/A 03/20/2014   Procedure: CARDIOVERSION;  Surgeon: Josue Hector, MD;  Location: [facility] ENDOSCOPY;  Service: Cardiovascular;  Laterality: N/A;   . CARDIOVERSION N/A 10/30/2014   Procedure: CARDIOVERSION;  Surgeon: Josue Hector, MD;  Location: [facility] ENDOSCOPY;  Service: Cardiovascular;  Laterality: N/A;  . CARPAL TUNNEL RELEASE     Right + LEFT  . CESAREAN SECTION     x 1   . COLONOSCOPY  2004  . CYST EXCISION     RT ARM   . CYSTOCELE REPAIR N/A 10/25/2012   Procedure: ANTERIOR REPAIR (CYSTOCELE);  Surgeon: Gus Height, MD;  Location: [facility] [facility];  Service: Gynecology;  Laterality: N/A;  . HEEL SPUR SURGERY Bilateral   . KNEE SURGERY     Right + LEFT  . LUMBAR LAMINECTOMY/DECOMPRESSION MICRODISCECTOMY N/A 03/19/2016   Procedure: Laminectomy and Foraminotomy - Lumbar three-four - Lumbar four-five;  Surgeon: Eustace Moore, MD;  Location: [place];  Service: Neurosurgery;  Laterality: N/A;  . RADIOLOGY WITH ANESTHESIA Right 11/01/2014   Procedure: MRI RIGHT FOREARM;  Surgeon: Medication Radiologist, MD;  Location: [place];  Service: Radiology;  Laterality: Right;  . RADIOLOGY WITH ANESTHESIA Right 08/29/2015   Procedure: MRI - RIGHT FOREARM;  Surgeon: Medication Radiologist, MD;  Location: [place];  Service: Radiology;  Laterality: Right;  . RADIOLOGY WITH ANESTHESIA N/A 12/05/2015  Procedure: MRI SPINE WITHOUT;  Surgeon: Medication Radiologist, MD;  Location: [place];  Service: Radiology;  Laterality: N/A;  . SKIN CANCER EXCISION     BILAT SHOULDERS  . SPLIT NIGHT STUDY  09/04/2015  . TONSILLECTOMY AND ADENOIDECTOMY      Vitals:   01/27/19 0805  SpO2: 95%    Subjective Assessment - 01/27/19 0805    Subjective  Pt. reports has had recent COPD exacerbation-using nebulizer and inhaler to address. Bilat. knee pain this AM but shoulders feeling OK today.    Currently in Pain?  Yes    Pain Score  4     Pain Location  Knee    Pain Orientation  Right;Left    Pain Descriptors / Indicators  Aching    Pain Type  Chronic pain    Pain Onset  More than a month ago    Pain Frequency  Intermittent    Aggravating Factors   standing and walking    Pain  Relieving Factors  rest    Effect of Pain on Daily Activities  limits standing and walking tolerance                       [facility] Adult PT Treatment/Exercise - 01/27/19 0001      Exercises   Exercises  Shoulder;Knee/Hip      Knee/Hip Exercises: Stretches   Passive Hamstring Stretch  Right;Left;2 reps;30 seconds      Knee/Hip Exercises: Aerobic   Nustep  L1 x 4 min UE/LE      Knee/Hip Exercises: Standing   Heel Raises  Both;15 reps    Hip Abduction  Right;Left;10 reps      Knee/Hip Exercises: Seated   Long Arc Quad  AROM;Strengthening;Both;20 reps    Long Arc Quad Weight  3 lbs.    Hamstring Curl  Strengthening;Both;15 reps    Hamstring Limitations  red Theraband    Sit to Sand  2 sets;5 reps;without UE support   from incline wedge on low table     Knee/Hip Exercises: Supine   Short Arc Quad Sets  AROM;Strengthening;Both;15 reps    Short Arc Quad Sets Limitations  2 lbs.    Hip Adduction Isometric  15 reps    Other Supine Knee/Hip Exercises  clamshell red band x 15 reps      Shoulder Exercises: Standing   External Rotation  AROM;Strengthening;Right;Left;15 reps    Theraband Level (Shoulder External Rotation)  Level 1 (Yellow)    Internal Rotation  AROM;Strengthening;Right;Left;15 reps    Theraband Level (Shoulder Internal Rotation)  Level 2 (Red)    Extension  AROM;Strengthening;Both;15 reps    Theraband Level (Shoulder Extension)  Level 2 (Red)    Row  AROM;Strengthening;Both;15 reps    Theraband Level (Shoulder Row)  Level 3 (Green)               PT Short Term Goals - 12/09/18 0714      PT SHORT TERM GOAL #1   Title  STG =LTG        PT Long Term Goals - 01/19/19 1309      PT LONG TERM GOAL #1   Title  Patient will decrease TUG time to 14 sec or less to decrease fall risk    Baseline  18.5 sec    Time  6    Period  Weeks    Status  On-going    Target Date  03/02/19      PT LONG TERM  GOAL #2   Title  Patient will increase bilat.  active left shoulder flexion to 135 degrees in order to perfrom IADl's    Baseline  right 95 deg, left 130 deg    Time  6    Period  Weeks    Status  Revised    Target Date  03/02/19      PT LONG TERM GOAL #3   Title  Patient will perfrom sit to stand test in 10 seconds in order to demonstrate improved functional mobility    Baseline  14 sec    Time  6    Period  Weeks    Status  On-going    Target Date  03/02/19      PT LONG TERM GOAL #4   Title  Patient will be independent with UE/LE strength and conditioning program    Baseline  instructed in basic HEP today-will update/progress prn    Time  6    Period  Weeks    Status  On-going    Target Date  03/02/19      PT LONG TERM GOAL #5   Title  Increase UE/LE strength grossly 1/2 MMT grade from current status to improve ability for lifting for chores/carrying groceries, improved ability sit>stand from low chairs and improved safety with gait    Baseline  see objective    Time  6    Period  Weeks    Status  New    Target Date  03/02/19            Plan - 01/27/19 0816    Clinical Impression Statement  Initial O2 sats 95% on room air. Checked 3 times during session and sats maintained at or above 92% throughout exercises. Tx. focus for shoulder and knee/LE strengthening. Fatigue with exercises at times but well-tolerated in terms of pain. Pt. will need continued/ongoing PT to address LE weakness, shoulder impingement/weakness and associated functional limitations for mobility and reaching ADLs.    Personal Factors and Comorbidities  Comorbidity 1;Comorbidity 2;Comorbidity 3+;Behavior Pattern    Comorbidities  obesity, dyspnea with exertion, multi joint OA    Examination-Activity Limitations  Squat;Stairs;Stand;Locomotion Level    Examination-Participation Restrictions  Proofreader;Yard Work    Stability/Clinical Decision Making  Evolving/Moderate complexity    Clinical Decision Making  Moderate     Rehab Potential  Good    PT Frequency  --   1-2x/week   PT Duration  6 weeks    PT Treatment/Interventions  ADLs/Self Care Home Management;Electrical Stimulation;Cryotherapy;Iontophoresis 4mg /ml Dexamethasone;Moist Heat;Ultrasound;DME Instruction;Gait training;Functional mobility training;Stair training;Therapeutic activities;Therapeutic exercise;Neuromuscular re-education;Manual techniques;Passive range of motion;Dry needling;Taping;Patient/family education    PT Next Visit Plan  NUSTEP warm up UE/LE, for shoulders review HEP as needed and continue with rotator cuff, shoulder strengthening as tolerated. For legs general strengthening within tolerance due to knee pain with open and closed chain activities.    PT Home Exercise Plan  Shoulder: Theraband ER, IR, row, alternating "punch". For legs sit<>stand, heel raises, hip abd in sitting, LAQ 90-45 deg    Consulted and Agree with Plan of Care  Patient       Patient will benefit from skilled therapeutic intervention in order to improve the following deficits and impairments:  Abnormal gait, Decreased mobility, Difficulty walking, Decreased activity tolerance, Decreased strength, Impaired UE functional use, Postural dysfunction, Pain, Decreased range of motion  Visit Diagnosis: Chronic left shoulder pain  Chronic right shoulder pain  Muscle weakness (generalized)  Other abnormalities of gait and  mobility     Problem List Patient Active Problem List   Diagnosis Date Noted  . Medication management 12/01/2018  . Shortness of breath 12/01/2018  . Former smoker 12/01/2018  . Atrial fibrillation, rapid ([place]) 04/29/2018  . COPD exacerbation ([place]) 04/29/2018  . Acute gastroenteritis 03/23/2018  . GERD (gastroesophageal reflux disease) 01/03/2018  . Proteinuria 09/15/2017  . Insomnia 03/17/2017  . Rash 11/04/2016  . Hematoma 11/04/2016  . Pre-operative examination for internal medicine 09/11/2016  . S/P lumbar laminectomy 03/19/2016  .  Overactive bladder 10/20/2015  . Pulmonary HTN ([place]) 06/19/2015  . Mild obstructive sleep apnea 06/19/2015  . Snoring 06/19/2015  . Abdominal pain 04/18/2015  . Cough 03/08/2015  . A-fib ([place]) 02/05/2014  . Preop cardiovascular exam 02/05/2014  . Arthritis of knee 02/05/2014  . Arm pain, right 08/31/2013  . Low back pain 09/26/2011  . Leg pain 07/07/2011  . Dizziness 07/07/2011  . Menopausal disorder 07/07/2011  . Hemorrhage of rectum and anus 11/04/2010  . Internal thrombosed hemorrhoids 11/04/2010  . Internal hemorrhoids with other complication 123456  . External hemorrhoids without mention of complication 123456  . Dyspepsia 09/24/2010  . Peripheral edema 09/24/2010  . Diabetes ([place]) 09/21/2010  . Encounter for preventative adult health care exam with abnormal findings 09/21/2010  . IBS 06/20/2009  . HEMATOCHEZIA 06/20/2009  . Elevated lipids 08/16/2008  . Anxiety state 08/16/2008  . Depression 08/16/2008  . Allergic rhinitis 08/16/2008  . ASTHMA 08/16/2008  . COPD (chronic obstructive pulmonary disease) ([place]) 08/16/2008  . GENITAL HERPES 12/03/2006  . Hypothyroidism 12/03/2006  . Essential hypertension 12/03/2006    Beaulah Dinning, PT, DPT 01/27/19 8:42 AM  [facility] [address] [place], [place], [address] Phone: [phone]   Fax:  [phone]  Name: DANAYA KITCHEL MRN: [id number] Date of Birth: [birth year]

## 2019-02-03 ENCOUNTER — Ambulatory Visit: Payer: Medicare Other | Admitting: Physical Therapy

## 2019-02-03 ENCOUNTER — Other Ambulatory Visit (HOSPITAL_COMMUNITY): Admission: RE | Admit: 2019-02-03 | Payer: Medicare Other | Source: Ambulatory Visit

## 2019-02-06 ENCOUNTER — Ambulatory Visit: Payer: Medicare Other | Admitting: Cardiovascular Disease

## 2019-02-06 ENCOUNTER — Other Ambulatory Visit (HOSPITAL_COMMUNITY)
Admission: RE | Admit: 2019-02-06 | Discharge: 2019-02-06 | Disposition: A | Discharge status: Home / Self Care | Payer: Medicare Other | Source: Ambulatory Visit | Attending: Emergency Medicine | Admitting: Emergency Medicine

## 2019-02-06 DIAGNOSIS — Z20828 Contact with and (suspected) exposure to other viral communicable diseases: Secondary | ICD-10-CM | POA: Diagnosis not present

## 2019-02-06 DIAGNOSIS — Z01812 Encounter for preprocedural laboratory examination: Secondary | ICD-10-CM | POA: Insufficient documentation

## 2019-02-07 LAB — NOVEL CORONAVIRUS, NAA (HOSP ORDER, SEND-OUT TO REF LAB; TAT 18-24 HRS): SARS-CoV-2, NAA: NOT DETECTED

## 2019-02-08 NOTE — Progress Notes (Signed)
@Patient  ID: Andrea Perry, female    DOB: [birth year], 68 y.o.   MRN: [id number]  Chief Complaint  Patient presents with  . Follow-up    2 month f/u for emphysema. States breathing has been ok since last visit.     Referring provider: Biagio Borg, MD  HPI:  68 year old female former smoker followed in our office for COPD with asthmatic component, history of cotton dust exposure, mild obstructive sleep apnea not currently managed on CPAP  PMH: Allergic rhinitis, hypothyroidism, dyspepsia, low back pain, abdominal pain, diabetes, IBS, hypertension, depression, anxiety Smoker/ Smoking History: Former smoker.  Quit 2005.  45-pack-year smoking history. Maintenance:  Stiolto Respimat  Pt of: Dr. Lamonte Sakai  02/09/2019  - Visit   68 year old female former smoker followed in our office for COPD with an asthmatic component.  Patient is followed by Dr. Lamonte Sakai.  She is presenting today after completing pulmonary function test.  Those results are listed below:  02/09/2019-pulmonary function test-FVC 1.48 (53% predicted), postbronchodilator ratio 58, postbronchodilator FEV1 1.07 (50% predicted), positive bronchodilator response, significant mid flow reversibility, DLCO 12.93 (72% predicted)   I last office visit in August/2020 patient [facility] to help with cost of inhalers.  Patient was attempted to be reached 3 times by [facility] case management team.  Patient reports that shortness of breath significantly improved when maintained on Stiolto Respimat.  Unfortunately she ran out of the inhaler sample that we provided and did not contact her office.  She struggles routinely with medication adherence as well as access to medications.  Patient reports that she preferred Stiolto Respimat over [employer].  She cannot remember if she used Symbicort in the past.  Previous blood work in 2019 as well as in 2018 showed eosinophilia.  Patient has daily cough,  shortness of breath and fatigue.  Questionaires / Pulmonary Flowsheets:   MMRC: mMRC Dyspnea Scale mMRC Score  02/09/2019 3    Tests:   04/30/2018-chest x-ray-no acute cardiopulmonary process, left basilar atelectasis  10/05/2017-echocardiogram-study was not technically sufficient to allow evaluation of LV diastolic dysfunction due to A. fib, pulmonary artery systolic pressure was within normal range  09/04/2015-split-night sleep study- AHI 4.9  01/11/2014-spirometry- FVC 2.3 (83% predicted), ratio 56, FEV1 1.3 (59% predicted)  02/09/2019-pulmonary function test-FVC 1.48 (53% predicted), postbronchodilator ratio 58, postbronchodilator FEV1 1.07 (50% predicted), positive bronchodilator response, significant mid flow reversibility, DLCO 12.93 (72% predicted)   03/23/2018-CBC with differential-eosinophils relative 1.8, eosinophils absolute 0.2  03/17/2017-CBC with differential-eosinophils relative 3.1, eosinophils absolute 0.3  FENO:  No results found for: NITRICOXIDE  PFT: PFT Results Latest Ref Rng & Units 02/09/2019  FVC-Pre L 1.48  FVC-Predicted Pre % 53  FVC-Post L 1.84  FVC-Predicted Post % 66  Pre FEV1/FVC % % 45  Post FEV1/FCV % % 58  FEV1-Pre L 0.66  FEV1-Predicted Pre % 31  FEV1-Post L 1.07  DLCO UNC% % 72  DLCO COR %Predicted % 81  TLC L 4.73  TLC % Predicted % 102  RV % Predicted % 152    WALK:  SIX MIN WALK 01/11/2014  Supplimental Oxygen during Test? (L/min) No  Tech Comments: During walk, patient's heart rate jumped around from 188, 198, 168, 88 to 154    Imaging: No results found.  Lab Results:  CBC    Component Value Date/Time   WBC 11.3 (H) 03/23/2018 1521   RBC 4.86 03/23/2018 1521   HGB 14.8 03/23/2018 1521   HCT 45.4 03/23/2018  1521   PLT 277.0 03/23/2018 1521   MCV 93.4 03/23/2018 1521   MCH 31.2 03/18/2016 1026   MCHC 32.6 03/23/2018 1521   RDW 14.5 03/23/2018 1521   LYMPHSABS 3.6 03/23/2018 1521   MONOABS 1.0 03/23/2018 1521    EOSABS 0.2 03/23/2018 1521   BASOSABS 0.1 03/23/2018 1521    BMET    Component Value Date/Time   NA 140 03/23/2018 1521   NA 143 09/08/2016 1427   K 4.0 03/23/2018 1521   CL 106 03/23/2018 1521   CO2 24 03/23/2018 1521   GLUCOSE 143 (H) 03/23/2018 1521   BUN 16 03/23/2018 1521   BUN 11 09/08/2016 1427   CREATININE 0.90 03/23/2018 1521   CALCIUM 9.0 03/23/2018 1521   GFRNONAA 97 09/08/2016 1427   GFRAA 112 09/08/2016 1427    BNP No results found for: BNP  ProBNP    Component Value Date/Time   PROBNP 919 (H) 09/08/2016 1427    Specialty Problems      Pulmonary Problems   Allergic rhinitis    Qualifier: Diagnosis of  By: Jenny Reichmann MD, Hunt Oris       COPD (chronic obstructive pulmonary disease) ([place])    Qualifier: Diagnosis of  By: Jenny Reichmann MD, Hunt Oris       Cough   Mild obstructive sleep apnea    09/04/2015-split-night sleep study- AHI 4.9      Snoring   COPD exacerbation (HCC)   Shortness of breath      Allergies  Allergen Reactions  . Tramadol Hcl Nausea And Vomiting and Other (See Comments)     mouth dryness, headache  . Codeine Nausea And Vomiting and Nausea Only  . Tape Rash    Plastic tape, bandaids and ekg leads, causes redness and rash  . Vicodin [Hydrocodone-Acetaminophen] Nausea And Vomiting    Immunization History  Administered Date(s) Administered  . Influenza, High Dose Seasonal PF 03/23/2018  . Influenza,inj,Quad PF,6+ Mos 03/01/2013, 02/14/2014, 03/08/2015, 04/16/2016, 01/12/2017  . Influenza-Unspecified 01/18/2017  . Pneumococcal Conjugate-13 03/30/2013  . Pneumococcal Polysaccharide-23 04/04/2014  . Tdap 09/24/2010   High-dose flu vaccine today  Past Medical History:  Diagnosis Date  . ALLERGIC RHINITIS 08/16/2008  . ANXIETY 08/16/2008  . Arthritis    hands, knees, lower back  . ASTHMA 08/16/2008  . Asthma    BRONCHITIS     DR. Lamonte Sakai    . Bladder leak   . Cancer ([place])    MELANOMA      . Chronic lower back pain    problems with  disc L2-5  . COPD 08/16/2008  . DEPRESSION 08/16/2008  . Diabetes mellitus without complication ([place])   . Dysrhythmia    hx AF  . Excessive daytime sleepiness 06/19/2015  . GENITAL HERPES 12/03/2006  . GERD (gastroesophageal reflux disease)   . H/O hiatal hernia   . HEMATOCHEZIA 06/20/2009  . Hemorrhoids   . History of cardioversion 10/30/14  . History of kidney stones   . HYPERLIPIDEMIA 08/16/2008  . HYPERTENSION 12/03/2006  . Hypertension   . Impaired glucose tolerance 09/21/2010  . Overactive bladder 10/20/2015  . Pneumonia    as a child  . Pulmonary HTN ([place]) 06/19/2015  . Shortness of breath dyspnea   . Sleep apnea    MILD NO CPAP ORDERED  . Snoring 06/19/2015    Tobacco History: Social History   Tobacco Use  Smoking Status Former Smoker  . Packs/day: 1.50  . Years: 30.00  . Pack years: 45.00  . Types:  Cigarettes  . Quit date: 04/21/2003  . Years since quitting: 15.8  Smokeless Tobacco Never Used   Counseling given: Not Answered  Continue to not smoke  Outpatient Encounter Medications as of 02/09/2019  Medication Sig  . albuterol (PROVENTIL HFA;VENTOLIN HFA) 108 (90 Base) MCG/ACT inhaler Inhale 2 puffs by mouth every 6 hours as needed for wheezing or shortness of breath. **Yearly physical due in June**  . amLODipine (NORVASC) 10 MG tablet Take 1 tablet by mouth once daily  . Blood Glucose Monitoring Suppl (FREESTYLE LITE) DEVI Use as directed daily E11.9  . cetirizine (ZYRTEC) 10 MG tablet Take 10 mg by mouth daily.   . Cholecalciferol (VITAMIN D) 2000 units tablet Take 2,000 Units by mouth daily.  . Cyanocobalamin (VITAMELTS ENERGY VITAMIN B-12) 1500 MCG TBDP Take 1,500 mcg by mouth daily.  Marland Kitchen dimenhyDRINATE (DRAMAMINE) 50 MG tablet Take 50 mg by mouth daily as needed for nausea.  . diphenoxylate-atropine (LOMOTIL) 2.5-0.025 MG tablet Take 1 tablet by mouth 4 (four) times daily as needed for diarrhea or loose stools.  Marland Kitchen ELIQUIS 5 MG TABS tablet TAKE 1 TABLET BY MOUTH TWICE  DAILY  . escitalopram (LEXAPRO) 20 MG tablet Take 1 tablet (20 mg total) by mouth daily.  Marland Kitchen estradiol (ESTRACE) 2 MG tablet Take 1 tablet (2 mg total) by mouth daily.  . flecainide (TAMBOCOR) 50 MG tablet TAKE 1 TABLET BY MOUTH TWICE DAILY PLEASE  KEEP  UPCOMING  APPOINTMENT  FOR  FURTHER  REFILLS  THANK  YOU  . fluticasone (FLONASE) 50 MCG/ACT nasal spray Place 2 sprays into both nostrils daily.  . furosemide (LASIX) 40 MG tablet Take 1 tablet by mouth once daily  . Garlic 123XX123 MG CAPS Take 1,000 mg by mouth at bedtime.   Marland Kitchen glucose blood (FREESTYLE LITE) test strip Use as instructed once daily E11.9  . guaiFENesin (MUCINEX) 600 MG 12 hr tablet Take 1 tablet (600 mg total) by mouth 2 (two) times daily as needed for cough or to loosen phlegm.  Marland Kitchen ipratropium-albuterol (DUONEB) 0.5-2.5 (3) MG/3ML SOLN USE 3 ML IN NEBULIZER  4 TIMES DAILY  . irbesartan (AVAPRO) 150 MG tablet Take 1 tablet by mouth once daily  . Lancets MISC Use as directed once daily E11.9  . levothyroxine (SYNTHROID) 25 MCG tablet Take 1 tablet (25 mcg total) by mouth daily before breakfast.  . LORazepam (ATIVAN) 1 MG tablet Take 1 tablet by mouth twice per day as needed for anxiety.  . meloxicam (MOBIC) 15 MG tablet Take one pill a day with food for 7 days and then prn thereafter  . metFORMIN (GLUCOPHAGE) 500 MG tablet Take 1 tablet (500 mg total) by mouth daily with breakfast.  . metoprolol tartrate (LOPRESSOR) 50 MG tablet TAKE 1.5 TABLET BY MOUTH 2 TIMES A DAY  . nystatin (MYCOSTATIN/NYSTOP) powder Use as directed twice daily as needed  . Omega-3 Fatty Acids (FISH OIL) 1200 MG CAPS Take 1,200 mg by mouth daily.   . ondansetron (ZOFRAN) 4 MG tablet Take 1 tablet (4 mg total) by mouth every 8 (eight) hours as needed for nausea or vomiting.  . pantoprazole (PROTONIX) 40 MG tablet Take 1 tablet by mouth once daily  . potassium chloride SA (K-DUR,KLOR-CON) 20 MEQ tablet Take 1 tablet (20 mEq total) by mouth daily.  . pravastatin  (PRAVACHOL) 40 MG tablet Take 1 tablet by mouth once daily  . Tetrahydrozoline HCl (VISINE OP) Place 1 drop into both eyes daily as needed (dry eyes).  Marland Kitchen  tiZANidine (ZANAFLEX) 2 MG tablet Take 1 tablet (2 mg total) by mouth every 6 (six) hours as needed for muscle spasms.  Marland Kitchen tolterodine (DETROL LA) 4 MG 24 hr capsule Take 4 mg by mouth daily.  . vitamin E 400 UNIT capsule Take 400 Units by mouth daily.  Marland Kitchen zolpidem (AMBIEN) 5 MG tablet Take 1 tablet (5 mg total) by mouth at bedtime as needed for sleep.  . [DISCONTINUED] azithromycin (ZITHROMAX) 250 MG tablet 2 tab by mouth day 1, then 1 per day  . [DISCONTINUED] predniSONE (DELTASONE) 10 MG tablet 3 tabs by mouth per day for 3 days,2tabs per day for 3 days,1tab per day for 3 days  . [DISCONTINUED] Tiotropium Bromide-Olodaterol (STIOLTO RESPIMAT) 2.5-2.5 MCG/ACT AERS Inhale 2 puffs into the lungs daily. (Patient not taking: Reported on 01/17/2019)  . [DISCONTINUED] Tiotropium Bromide-Olodaterol (STIOLTO RESPIMAT) 2.5-2.5 MCG/ACT AERS Inhale 2 puffs into the lungs daily. (Patient not taking: Reported on 01/17/2019)   No facility-administered encounter medications on file as of 02/09/2019.      Review of Systems  Review of Systems  Constitutional: Positive for fatigue. Negative for activity change and fever.  HENT: Negative for sinus pressure, sinus pain and sore throat.   Respiratory: Positive for cough and shortness of breath. Negative for wheezing.   Cardiovascular: Negative for chest pain, palpitations and leg swelling.  Gastrointestinal: Negative for diarrhea, nausea and vomiting.  Musculoskeletal: Negative for arthralgias.  Neurological: Negative for dizziness.  Psychiatric/Behavioral: Negative for sleep disturbance. The patient is not nervous/anxious.      Physical Exam  BP 124/82   Pulse 73   Temp (!) 97.1 F (36.2 C) (Temporal)   Ht 5\' 2"  (1.575 m)   Wt 268 lb (121.6 kg)   SpO2 96%   BMI 49.02 kg/m   Wt Readings from Last 5  Encounters:  02/09/19 268 lb (121.6 kg)  01/17/19 265 lb (120.2 kg)  12/22/18 270 lb (122.5 kg)  12/06/18 288 lb (130.6 kg)  12/01/18 288 lb 12.8 oz (131 kg)    BMI Readings from Last 5 Encounters:  02/09/19 49.02 kg/m  01/17/19 48.47 kg/m  12/22/18 49.38 kg/m  12/06/18 52.68 kg/m  12/01/18 52.82 kg/m     Physical Exam Vitals signs and nursing note reviewed.  Constitutional:      General: She is not in acute distress.    Appearance: Normal appearance. She is obese.  HENT:     Head: Normocephalic and atraumatic.     Right Ear: Tympanic membrane, ear canal and external ear normal. There is no impacted cerumen.     Left Ear: Tympanic membrane, ear canal and external ear normal. There is no impacted cerumen.     Nose: Nose normal. No congestion.     Mouth/Throat:     Mouth: Mucous membranes are moist.     Pharynx: Oropharynx is clear.  Eyes:     Pupils: Pupils are equal, round, and reactive to light.  Neck:     Musculoskeletal: Normal range of motion.  Cardiovascular:     Rate and Rhythm: Normal rate and regular rhythm.     Pulses: Normal pulses.     Heart sounds: Normal heart sounds. No murmur.  Pulmonary:     Effort: Pulmonary effort is normal. No respiratory distress.     Breath sounds: Normal breath sounds. No decreased air movement. No decreased breath sounds, wheezing or rales.  Abdominal:     General: Abdomen is flat. Bowel sounds are normal.  Palpations: Abdomen is soft.     Comments: Obese  Musculoskeletal:     Right lower leg: 2+ Pitting Edema present.     Left lower leg: 2+ Pitting Edema present.  Skin:    General: Skin is warm and dry.     Capillary Refill: Capillary refill takes less than 2 seconds.  Neurological:     General: No focal deficit present.     Mental Status: She is alert and oriented to person, place, and time. Mental status is at baseline.     Gait: Gait (Only able to complete 1 lap in office) normal.  Psychiatric:        Mood and  Affect: Mood normal.        Behavior: Behavior normal.        Thought Content: Thought content normal.        Judgment: Judgment normal.       Assessment & Plan:   Pulmonary HTN (HCC) Weight is down 20 pounds over the last 2 months.  This is likely directly related to her diuretic use.  Plan: Lab work today Continue Lasix as managed by primary care Start weighing yourself daily in the morning  Allergic rhinitis Plan: Continue Zyrtec  COPD (chronic obstructive pulmonary disease) Pulmonary function testing reviewed today showing strong asthmatic component Patient reports symptomatic improvement when on maintenance inhalers Blood work showing peripheral eosinophilia  Plan: Therapeutic trial of Breztri Continue DuoNeb nebulized medication as needed for shortness of breath or wheezing Lab work today Walk today in office Referral to clinical pharmacy team [facility] Close follow-up in office in 2 weeks  Healthcare maintenance Plan: High-dose flu vaccine today  Medication management Patient struggles with medication affordability as well as access  Plan: [facility] for help with medication assistance Referral to clinical pharmacy team to review inhaler use as well as to help patient with medication access  Due to lack of follow-up in August/2021 patient was [facility] we will bring patient also back for in person pharmacy evaluation.  It is very important that patient is maintained on inhalers to improve her overall dyspnea, physical activity as well as maintenance of her COPD.   Shortness of breath I believe this is likely multifactorial as patient has restrictive lung disease, obstructive lung disease, morbid obesity, physically sedentary, likely diastolic dysfunction, pulmonary hypertension.  These are all likely contributing to her shortness of breath.  Her shortness of breath was improved  on maintenance inhalers.  Plan: Start Breztri inhaler Continue Lasix 40 mg daily Start weighing yourself regularly Lab work today [place] today in office 2-week follow-up with our office as well as with pharmacy follow-up    Return in about 2 weeks (around 02/23/2019), or if symptoms worsen or fail to improve, for Follow up with Wyn Quaker FNP-C.   Andrea Rinne, NP 02/09/2019   This appointment was 34 minutes long with over 50% of the time in direct face-to-face patient care, assessment, plan of care, and follow-up.

## 2019-02-09 ENCOUNTER — Ambulatory Visit (INDEPENDENT_AMBULATORY_CARE_PROVIDER_SITE_OTHER): Payer: Medicare Other | Admitting: Emergency Medicine

## 2019-02-09 ENCOUNTER — Other Ambulatory Visit: Payer: Self-pay

## 2019-02-09 ENCOUNTER — Encounter: Payer: Self-pay | Admitting: Pulmonary Disease

## 2019-02-09 ENCOUNTER — Telehealth: Payer: Self-pay | Admitting: Internal Medicine

## 2019-02-09 ENCOUNTER — Ambulatory Visit (INDEPENDENT_AMBULATORY_CARE_PROVIDER_SITE_OTHER): Payer: Medicare Other | Admitting: Pulmonary Disease

## 2019-02-09 VITALS — BP 124/82 | HR 73 | Temp 97.1°F | Ht 62.0 in | Wt 268.0 lb

## 2019-02-09 DIAGNOSIS — I272 Pulmonary hypertension, unspecified: Secondary | ICD-10-CM | POA: Diagnosis not present

## 2019-02-09 DIAGNOSIS — J438 Other emphysema: Secondary | ICD-10-CM | POA: Diagnosis not present

## 2019-02-09 DIAGNOSIS — Z0000 Encounter for general adult medical examination without abnormal findings: Secondary | ICD-10-CM

## 2019-02-09 DIAGNOSIS — R0602 Shortness of breath: Secondary | ICD-10-CM | POA: Diagnosis not present

## 2019-02-09 DIAGNOSIS — Z23 Encounter for immunization: Secondary | ICD-10-CM | POA: Diagnosis not present

## 2019-02-09 DIAGNOSIS — G4733 Obstructive sleep apnea (adult) (pediatric): Secondary | ICD-10-CM | POA: Diagnosis not present

## 2019-02-09 DIAGNOSIS — J301 Allergic rhinitis due to pollen: Secondary | ICD-10-CM | POA: Diagnosis not present

## 2019-02-09 DIAGNOSIS — Z79899 Other long term (current) drug therapy: Secondary | ICD-10-CM

## 2019-02-09 LAB — CBC WITH DIFFERENTIAL/PLATELET
Basophils Absolute: 0.1 10*3/uL (ref 0.0–0.1)
Basophils Relative: 0.6 % (ref 0.0–3.0)
Eosinophils Absolute: 0.2 10*3/uL (ref 0.0–0.7)
Eosinophils Relative: 1.7 % (ref 0.0–5.0)
HCT: 40.8 % (ref 36.0–46.0)
Hemoglobin: 13.2 g/dL (ref 12.0–15.0)
Lymphocytes Relative: 25.6 % (ref 12.0–46.0)
Lymphs Abs: 3 10*3/uL (ref 0.7–4.0)
MCHC: 32.4 g/dL (ref 30.0–36.0)
MCV: 94 fl (ref 78.0–100.0)
Monocytes Absolute: 0.8 10*3/uL (ref 0.1–1.0)
Monocytes Relative: 6.6 % (ref 3.0–12.0)
Neutro Abs: 7.7 10*3/uL (ref 1.4–7.7)
Neutrophils Relative %: 65.5 % (ref 43.0–77.0)
Platelets: 269 10*3/uL (ref 150.0–400.0)
RBC: 4.34 Mil/uL (ref 3.87–5.11)
RDW: 15.2 % (ref 11.5–15.5)
WBC: 11.7 10*3/uL — ABNORMAL HIGH (ref 4.0–10.5)

## 2019-02-09 LAB — PULMONARY FUNCTION TEST
DL/VA % pred: 81 %
DL/VA: 3.47 ml/min/mmHg/L
DLCO unc % pred: 72 %
DLCO unc: 12.93 ml/min/mmHg
FEF 25-75 Post: 0.79 L/sec
FEF 25-75 Pre: 0.25 L/sec
FEF2575-%Change-Post: 218 %
FEF2575-%Pred-Post: 41 %
FEF2575-%Pred-Pre: 12 %
FEV1-%Change-Post: 61 %
FEV1-%Pred-Post: 50 %
FEV1-%Pred-Pre: 31 %
FEV1-Post: 1.07 L
FEV1-Pre: 0.66 L
FEV1FVC-%Change-Post: 29 %
FEV1FVC-%Pred-Pre: 58 %
FEV6-%Change-Post: 31 %
FEV6-%Pred-Post: 68 %
FEV6-%Pred-Pre: 52 %
FEV6-Post: 1.83 L
FEV6-Pre: 1.39 L
FEV6FVC-%Change-Post: 5 %
FEV6FVC-%Pred-Post: 103 %
FEV6FVC-%Pred-Pre: 98 %
FVC-%Change-Post: 24 %
FVC-%Pred-Post: 66 %
FVC-%Pred-Pre: 53 %
FVC-Post: 1.84 L
FVC-Pre: 1.48 L
Post FEV1/FVC ratio: 58 %
Post FEV6/FVC ratio: 100 %
Pre FEV1/FVC ratio: 45 %
Pre FEV6/FVC Ratio: 94 %
RV % pred: 152 %
RV: 2.98 L
TLC % pred: 102 %
TLC: 4.73 L

## 2019-02-09 LAB — COMPREHENSIVE METABOLIC PANEL
ALT: 13 U/L (ref 0–35)
AST: 16 U/L (ref 0–37)
Albumin: 4 g/dL (ref 3.5–5.2)
Alkaline Phosphatase: 74 U/L (ref 39–117)
BUN: 15 mg/dL (ref 6–23)
CO2: 28 mEq/L (ref 19–32)
Calcium: 8.8 mg/dL (ref 8.4–10.5)
Chloride: 104 mEq/L (ref 96–112)
Creatinine, Ser: 0.87 mg/dL (ref 0.40–1.20)
GFR: 65.12 mL/min (ref >60.00)
Glucose, Bld: 154 mg/dL — ABNORMAL HIGH (ref 70–99)
Potassium: 4.4 mEq/L (ref 3.5–5.1)
Sodium: 143 mEq/L (ref 135–145)
Total Bilirubin: 0.9 mg/dL (ref 0.2–1.2)
Total Protein: 6.9 g/dL (ref 6.0–8.3)

## 2019-02-09 MED ORDER — BREZTRI AEROSPHERE 160-9-4.8 MCG/ACT IN AERO: 2.0000 | INHALATION_SPRAY | Freq: Two times a day (BID) | RESPIRATORY_TRACT | 0 refills | Status: DC

## 2019-02-09 NOTE — Assessment & Plan Note (Signed)
I believe this is likely multifactorial as patient has restrictive lung disease, obstructive lung disease, morbid obesity, physically sedentary, likely diastolic dysfunction, pulmonary hypertension.  These are all likely contributing to her shortness of breath.  Her shortness of breath was improved on maintenance inhalers.  Plan: Start Breztri inhaler Continue Lasix 40 mg daily Start weighing yourself regularly Lab work today Walk today in office 2-week follow-up with our office as well as with pharmacy follow-up

## 2019-02-09 NOTE — Assessment & Plan Note (Signed)
Plan: Continue Zyrtec 

## 2019-02-09 NOTE — Telephone Encounter (Signed)
On call- [facility] called result. Attempted call back to given number [phone], got voice mail indicating that number not available from my calling area. Review Google Maps indicating [facility] offices now closed.

## 2019-02-09 NOTE — Progress Notes (Signed)
Patient seen in the office today and instructed on use of Breztri and spacer.  Patient expressed understanding and demonstrated technique.  Benetta Spar [facility] 02/09/2019

## 2019-02-09 NOTE — Assessment & Plan Note (Signed)
Patient struggles with medication affordability as well as access  Plan: [facility] for help with medication assistance Referral to clinical pharmacy team to review inhaler use as well as to help patient with medication access  Due to lack of follow-up in August/2021 patient was [facility] we will bring patient also back for in person pharmacy evaluation.  It is very important that patient is maintained on inhalers to improve her overall dyspnea, physical activity as well as maintenance of her COPD.

## 2019-02-09 NOTE — Patient Instructions (Addendum)
You were seen today by Lauraine Rinne, NP  for:   1. Other emphysema ([place])  Start Breztri  >>> 2 puffs in the morning right when you wake up, rinse out your mouth after use, 12 hours later 2 puffs, rinse after use >>> Take this daily, no matter what >>> This is not a rescue inhaler   Samples of Breztri  provided today, contact our office when you finish the first inhaler which should be in 7 days and let us know how you are doing on this medication.  If you are tolerating this please let us know so that we we can place a prescription for maintenance use  Continue to use DuoNeb nebulized medication every 6-8 hours as needed for shortness of breath and wheezing  Note your daily symptoms > remember "red flags" for COPD:   >>>Increase in cough >>>increase in sputum production >>>increase in shortness of breath or activity  intolerance.   If you notice these symptoms, please call the office to be seen.   High-dose flu vaccine today  Walk today in office  - AMB Referral to [place] [facility]  2. Pulmonary HTN ([place])  - Pro b natriuretic peptide (BNP); Future  Start weighing yourself daily Weigh yourself in the morning before eating or drinking anything and getting dressed for the day Record your weights  3. Mild obstructive sleep apnea   4. Allergic rhinitis due to pollen, unspecified seasonality  Continue Zyrtec daily  5. Medication management  - AMB Referral to [place] [facility]  We have referred [facility] to help with your medication cost.  They will call you over the phone.  Please call them back if they leave a message for you.  If you are having trouble getting in contact with them.  Your previous RN case manager who attempted to call you in September/2020 is listed below:  Enzo Montgomery, RN,BSN,CCM [place] [facility] Telephonic Care Management Coordinator Direct Phone: [phone]  We will also bring you in for a appointment with our  pharmacy team in 2 weeks  6. Shortness of breath  - Pro b natriuretic peptide (BNP); Future - CBC with Differential/Platelet; Future - Comp Met (CMET); Future  7. Healthcare maintenance  High-dose flu vaccine today   We recommend today:  Orders Placed This Encounter  Procedures  . Pro b natriuretic peptide (BNP)    Standing Status:   Future    Standing Expiration Date:   02/09/2020  . CBC with Differential/Platelet    Standing Status:   Future    Standing Expiration Date:   02/09/2020  . Comp Met (CMET)    Standing Status:   Future    Standing Expiration Date:   02/09/2020  . AMB Referral to [place] [facility]    Referral Priority:   Routine    Referral Type:   Consultation    Referral Reason:   [facility]    Number of Visits Requested:   1   Orders Placed This Encounter  Procedures  . Pro b natriuretic peptide (BNP)  . CBC with Differential/Platelet  . Comp Met (CMET)  . AMB Referral to [place] [facility]   No orders of the defined types were placed in this encounter.   Follow Up:    Return in about 2 weeks (around 02/23/2019), or if symptoms worsen or fail to improve, for Follow up with Wyn Quaker FNP-C.  Please also schedule an appointment with our clinical pharmacy team in  our office over the next 2 weeks. For inhaler review and help with medication access in conjunction with [facility].   Please do your part to reduce the spread of COVID-19:      Reduce your risk of any infection  and COVID19 by using the similar precautions used for avoiding the common cold or flu:  Marland Kitchen Wash your hands often with soap and warm water for at least 20 seconds.  If soap and water are not readily available, use an alcohol-based hand sanitizer with at least 60% alcohol.  . If coughing or sneezing, cover your mouth and nose by coughing or sneezing into the elbow areas of your shirt or coat, into a tissue or into your sleeve (not your hands). Langley Gauss A MASK when in public  .  Avoid shaking hands with others and consider head nods or verbal greetings only. . Avoid touching your eyes, nose, or mouth with unwashed hands.  . Avoid close contact with people who are sick. . Avoid places or events with large numbers of people in one location, like concerts or sporting events. . If you have some symptoms but not all symptoms, continue to monitor at home and seek medical attention if your symptoms worsen. . If you are having a medical emergency, call 911.   [facility] / e-Visit: [email or web address]         [facility]: [facility]: [phone]                   [facility]: [phone]     It is flu season:   >>> Best ways to protect herself from the flu: Receive the yearly flu vaccine, practice good hand hygiene washing with soap and also using hand sanitizer when available, eat a nutritious meals, get adequate rest, hydrate appropriately   Please contact the office if your symptoms worsen or you have concerns that you are not improving.   Thank you for choosing [facility] for your healthcare, and for allowing [place] to partner with you on your healthcare journey. I am thankful to be able to provide care to you today.   Wyn Quaker FNP-C

## 2019-02-09 NOTE — Progress Notes (Signed)
Discussed results with patient in office.  Pt started on Breztri. 2 week follow up planned. Nothing further needed.  Wyn Quaker FNP

## 2019-02-09 NOTE — Assessment & Plan Note (Signed)
Plan: High-dose flu vaccine today 

## 2019-02-09 NOTE — Addendum Note (Signed)
Addended by: Valerie Salts on: 02/09/2019 03:01 PM   Modules accepted: Orders

## 2019-02-09 NOTE — Assessment & Plan Note (Signed)
Pulmonary function testing reviewed today showing strong asthmatic component Patient reports symptomatic improvement when on maintenance inhalers Blood work showing peripheral eosinophilia  Plan: Therapeutic trial of Breztri Continue DuoNeb nebulized medication as needed for shortness of breath or wheezing Lab work today Walk today in office Referral to clinical pharmacy team [facility] Close follow-up in office in 2 weeks

## 2019-02-09 NOTE — Assessment & Plan Note (Signed)
Weight is down 20 pounds over the last 2 months.  This is likely directly related to her diuretic use.  Plan: Lab work today Continue Lasix as managed by primary care Start weighing yourself daily in the morning

## 2019-02-10 ENCOUNTER — Other Ambulatory Visit: Payer: Self-pay

## 2019-02-10 ENCOUNTER — Encounter: Payer: Self-pay | Admitting: Physical Therapy

## 2019-02-10 ENCOUNTER — Ambulatory Visit: Payer: Medicare Other | Admitting: Physical Therapy

## 2019-02-10 VITALS — HR 130

## 2019-02-10 DIAGNOSIS — M6281 Muscle weakness (generalized): Secondary | ICD-10-CM | POA: Diagnosis not present

## 2019-02-10 DIAGNOSIS — M25511 Pain in right shoulder: Secondary | ICD-10-CM | POA: Diagnosis not present

## 2019-02-10 DIAGNOSIS — R2689 Other abnormalities of gait and mobility: Secondary | ICD-10-CM

## 2019-02-10 DIAGNOSIS — M25512 Pain in left shoulder: Secondary | ICD-10-CM | POA: Diagnosis not present

## 2019-02-10 DIAGNOSIS — G8929 Other chronic pain: Secondary | ICD-10-CM

## 2019-02-10 LAB — PRO B NATRIURETIC PEPTIDE: NT-Pro BNP: 1611 pg/mL — ABNORMAL HIGH (ref 0–301)

## 2019-02-10 NOTE — Progress Notes (Signed)
Lab work results of come back.  Your shortness of breath may also be directly related to the fact that you are retaining fluid.  Please ensure the patient has been taking her daily Lasix 40 mg as prescribed.  If she reports adherence then we will increase her Lasix to 40 mg twice daily spaces out at least 6 hours apart.  She needs to take this for 5 days.  She also needs to follow-up with primary care.  She needs to be weighing herself daily as well as avoiding salt.  Please route results to primary care.  If patient needs additional Lasix tablets in order to make this change.  Okay to send in a small prescription of 10 40 mg tablets  Keep planned follow-up with our office in 2 weeks.  Wyn Quaker, FNP

## 2019-02-10 NOTE — Therapy (Signed)
[place], [place], [address] Phone: [phone]   Fax:  [phone]  Physical Therapy Treatment  Patient Details  Name: Andrea Perry MRN: [id number] Date of Birth: Jan 09, 1951 Referring Provider (PT): Lilia Argue, DO   Encounter Date: 02/10/2019  PT End of Session - 02/10/19 1406    Visit Number  4    Number of Visits  13    Date for PT Re-Evaluation  03/02/19    Authorization Type  [employer] Medicare    PT Start Time  1109   pt. arrived late   PT Stop Time  1144    PT Time Calculation (min)  35 min    Activity Tolerance  Patient limited by fatigue    Behavior During Therapy  [facility] for tasks assessed/performed       Past Medical History:  Diagnosis Date  . ALLERGIC RHINITIS 08/16/2008  . ANXIETY 08/16/2008  . Arthritis    hands, knees, lower back  . ASTHMA 08/16/2008  . Asthma    BRONCHITIS     DR. Lamonte Sakai    . Bladder leak   . Cancer ([place])    MELANOMA      . Chronic lower back pain    problems with disc L2-5  . COPD 08/16/2008  . DEPRESSION 08/16/2008  . Diabetes mellitus without complication ([place])   . Dysrhythmia    hx AF  . Excessive daytime sleepiness 06/19/2015  . GENITAL HERPES 12/03/2006  . GERD (gastroesophageal reflux disease)   . H/O hiatal hernia   . HEMATOCHEZIA 06/20/2009  . Hemorrhoids   . History of cardioversion 10/30/14  . History of kidney stones   . HYPERLIPIDEMIA 08/16/2008  . HYPERTENSION 12/03/2006  . Hypertension   . Impaired glucose tolerance 09/21/2010  . Overactive bladder 10/20/2015  . Pneumonia    as a child  . Pulmonary HTN ([place]) 06/19/2015  . Shortness of breath dyspnea   . Sleep apnea    MILD NO CPAP ORDERED  . Snoring 06/19/2015    Past Surgical History:  Procedure Laterality Date  . ABDOMINAL HYSTERECTOMY    . APPENDECTOMY    . CARDIOVERSION N/A 03/20/2014   Procedure: CARDIOVERSION;  Surgeon: Josue Hector, MD;  Location: [facility] ENDOSCOPY;  Service: Cardiovascular;   Laterality: N/A;  . CARDIOVERSION N/A 10/30/2014   Procedure: CARDIOVERSION;  Surgeon: Josue Hector, MD;  Location: [facility] ENDOSCOPY;  Service: Cardiovascular;  Laterality: N/A;  . CARPAL TUNNEL RELEASE     Right + LEFT  . CESAREAN SECTION     x 1   . COLONOSCOPY  2004  . CYST EXCISION     RT ARM   . CYSTOCELE REPAIR N/A 10/25/2012   Procedure: ANTERIOR REPAIR (CYSTOCELE);  Surgeon: Gus Height, MD;  Location: [place];  Service: Gynecology;  Laterality: N/A;  . HEEL SPUR SURGERY Bilateral   . KNEE SURGERY     Right + LEFT  . LUMBAR LAMINECTOMY/DECOMPRESSION MICRODISCECTOMY N/A 03/19/2016   Procedure: Laminectomy and Foraminotomy - Lumbar three-four - Lumbar four-five;  Surgeon: Eustace Moore, MD;  Location: [place];  Service: Neurosurgery;  Laterality: N/A;  . RADIOLOGY WITH ANESTHESIA Right 11/01/2014   Procedure: MRI RIGHT FOREARM;  Surgeon: Medication Radiologist, MD;  Location: [place];  Service: Radiology;  Laterality: Right;  . RADIOLOGY WITH ANESTHESIA Right 08/29/2015   Procedure: MRI - RIGHT FOREARM;  Surgeon: Medication Radiologist, MD;  Location: [place];  Service: Radiology;  Laterality: Right;  . RADIOLOGY  WITH ANESTHESIA N/A 12/05/2015   Procedure: MRI SPINE WITHOUT;  Surgeon: Medication Radiologist, MD;  Location: [place];  Service: Radiology;  Laterality: N/A;  . SKIN CANCER EXCISION     BILAT SHOULDERS  . SPLIT NIGHT STUDY  09/04/2015  . TONSILLECTOMY AND ADENOIDECTOMY      Vitals:   02/10/19 1404  Pulse: (!) 130  SpO2: 98%    Subjective Assessment - 02/10/19 1402    Subjective  Pt. returns, not seen for the past 2 weeks. She has been having some issues with COPD exacerbation and LE swelling addressed with recent MD visits. No pain pre-tx. but still having left>right shoulder issues with reaching activities and also with c/o LE weakness.    Pertinent History  diabetic, asthma, COPD, chronic LBP, knee OA, knee sx., heel sx.    Limitations  Lifting;House hold  activities;Standing;Walking    Diagnostic tests  X-rays: moderate to severe try compartmental OA    Patient Stated Goals  to have less pain with gait    Currently in Pain?  No/denies         [facility] PT Assessment - 02/10/19 0001      AROM   Right Shoulder Flexion  140 Degrees    Right Shoulder ABduction  130 Degrees    Left Shoulder Flexion  150 Degrees    Left Shoulder ABduction  110 Degrees                   [facility] Adult PT Treatment/Exercise - 02/10/19 0001      Knee/Hip Exercises: Aerobic   Nustep  --   held due to initial elevated HR     Knee/Hip Exercises: Standing   Forward Step Up  Right;Left;1 set;10 reps;Hand Hold: 2;Step Height: 4"      Knee/Hip Exercises: Seated   Long Arc Quad  AROM;Strengthening;Both;20 reps    Long Arc Quad Weight  3 lbs.    Clamshell with TheraBand  Green   2x10   Hamstring Curl  Strengthening;Right;Left;2 sets;10 reps    Hamstring Limitations  green Theraband    Sit to Sand  2 sets;5 reps   hands holding 5 lb. KB, from incline wedge on low table     Shoulder Exercises: Standing   External Rotation  AROM;Strengthening;Both;20 reps    Theraband Level (Shoulder External Rotation)  Level 2 (Red)    Internal Rotation  AROM;Strengthening;Right;Left;20 reps    Theraband Level (Shoulder Internal Rotation)  Level 2 (Red)    Extension  AROM;Strengthening;Both;20 reps    Theraband Level (Shoulder Extension)  Level 2 (Red)    Row  AROM;Strengthening;Both;20 reps    Theraband Level (Shoulder Row)  Level 3 Nyoka Cowden)             PT Education - 02/10/19 1405    Education Details  Heart rate-continue to monitor at home and alert MD if issues with staying elevated    Person(s) Educated  Patient    Methods  Explanation    Comprehension  Verbalized understanding       PT Short Term Goals - 12/09/18 0714      PT SHORT TERM GOAL #1   Title  STG =LTG        PT Long Term Goals - 02/10/19 1412      PT LONG TERM GOAL #1   Title   Patient will decrease TUG time to 14 sec or less to decrease fall risk    Baseline  18.5 sec at last assessment-not  retested today    Time  6    Period  Weeks    Status  On-going      PT LONG TERM GOAL #2   Title  Patient will increase bilat. active left shoulder flexion to 135 degrees in order to perfrom IADl's    Baseline  left 150 deg, right 140 deg    Time  6    Period  Weeks    Status  Achieved      PT LONG TERM GOAL #3   Title  Patient will perfrom sit to stand test in 10 seconds in order to demonstrate improved functional mobility    Baseline  14 sec at last assessment-not retested today    Time  6    Period  Weeks    Status  On-going      PT LONG TERM GOAL #4   Title  Patient will be independent with UE/LE strength and conditioning program    Baseline  instructed in HEP but status for independent conditioning problem setback by COPD exacerbation, continue goal    Time  6    Period  Weeks    Status  On-going      PT LONG TERM GOAL #5   Title  Increase UE/LE strength grossly 1/2 MMT grade from current status to improve ability for lifting for chores/carrying groceries, improved ability sit>stand from low chairs and improved safety with gait    Baseline  not retested today    Time  6    Period  Weeks    Status  On-going            Plan - 02/10/19 1407    Clinical Impression Statement  HR was noted to be elevated at beginning of treatment session (130 bpm while checking O2)-pt. attributed to rushing to appointment as she was running a few minutes late. HR was aso noted to be irregular (has history of a-fib). HR decreased to 80 bpm with rest and was monitored with exercises at rate of 80-110 bpm. Activity tolerance for exercises today limited by fatigue. Pt. has made improvements from baseline wih her shoulder ROM (see objective) with subsequent functional gains for reaching activities but continues with functional limitations associated with LE weakness and  deconditioning.    Personal Factors and Comorbidities  Comorbidity 1;Comorbidity 2;Comorbidity 3+;Behavior Pattern    Comorbidities  obesity, dyspnea with exertion, multi joint OA    Examination-Activity Limitations  Squat;Stairs;Stand;Locomotion Level    Examination-Participation Restrictions  Proofreader;Yard Work    Stability/Clinical Decision Making  Evolving/Moderate complexity    Clinical Decision Making  Moderate    Rehab Potential  Good    PT Frequency  --   1-2x/week   PT Duration  6 weeks    PT Treatment/Interventions  ADLs/Self Care Home Management;Electrical Stimulation;Cryotherapy;Iontophoresis 4mg /ml Dexamethasone;Moist Heat;Ultrasound;DME Instruction;Gait training;Functional mobility training;Stair training;Therapeutic activities;Therapeutic exercise;Neuromuscular re-education;Manual techniques;Passive range of motion;Dry needling;Taping;Patient/family education    PT Next Visit Plan  monitor vitals as needed, continue shoulder strengthening/stabilization and LE strengthening as tolerated    PT Home Exercise Plan  Shoulder: Theraband ER, IR, row, alternating "punch". For legs sit<>stand, heel raises, hip abd in sitting, LAQ 90-45 deg    Consulted and Agree with Plan of Care  Patient       Patient will benefit from skilled therapeutic intervention in order to improve the following deficits and impairments:  Abnormal gait, Decreased mobility, Difficulty walking, Decreased activity tolerance, Decreased strength, Impaired UE functional use, Postural dysfunction, Pain,  Decreased range of motion  Visit Diagnosis: Chronic left shoulder pain  Chronic right shoulder pain  Muscle weakness (generalized)  Other abnormalities of gait and mobility     Problem List Patient Active Problem List   Diagnosis Date Noted  . Healthcare maintenance 02/09/2019  . Medication management 12/01/2018  . Shortness of breath 12/01/2018  . Former smoker 12/01/2018   . Atrial fibrillation, rapid ([place]) 04/29/2018  . COPD exacerbation ([place]) 04/29/2018  . Acute gastroenteritis 03/23/2018  . GERD (gastroesophageal reflux disease) 01/03/2018  . Proteinuria 09/15/2017  . Insomnia 03/17/2017  . Rash 11/04/2016  . Hematoma 11/04/2016  . Pre-operative examination for internal medicine 09/11/2016  . S/P lumbar laminectomy 03/19/2016  . Overactive bladder 10/20/2015  . Pulmonary HTN ([place]) 06/19/2015  . Mild obstructive sleep apnea 06/19/2015  . Snoring 06/19/2015  . Abdominal pain 04/18/2015  . Cough 03/08/2015  . A-fib ([place]) 02/05/2014  . Preop cardiovascular exam 02/05/2014  . Arthritis of knee 02/05/2014  . Arm pain, right 08/31/2013  . Low back pain 09/26/2011  . Leg pain 07/07/2011  . Dizziness 07/07/2011  . Menopausal disorder 07/07/2011  . Hemorrhage of rectum and anus 11/04/2010  . Internal thrombosed hemorrhoids 11/04/2010  . Internal hemorrhoids with other complication 123456  . External hemorrhoids without mention of complication 123456  . Dyspepsia 09/24/2010  . Peripheral edema 09/24/2010  . Diabetes ([place]) 09/21/2010  . Encounter for preventative adult health care exam with abnormal findings 09/21/2010  . IBS 06/20/2009  . HEMATOCHEZIA 06/20/2009  . Elevated lipids 08/16/2008  . Anxiety state 08/16/2008  . Depression 08/16/2008  . Allergic rhinitis 08/16/2008  . ASTHMA 08/16/2008  . COPD (chronic obstructive pulmonary disease) ([place]) 08/16/2008  . GENITAL HERPES 12/03/2006  . Hypothyroidism 12/03/2006  . Essential hypertension 12/03/2006    Beaulah Dinning, PT, DPT 02/10/19 2:16 PM  [facility] [address] [place], [place], [address] Phone: [phone]   Fax:  [phone]  Name: Andrea Perry MRN: [id number] Date of Birth: 1950/09/25

## 2019-02-13 ENCOUNTER — Telehealth: Payer: Self-pay | Admitting: Internal Medicine

## 2019-02-13 NOTE — Telephone Encounter (Signed)
Notes recorded by Lauraine Rinne, NP on 02/10/2019 at 8:32 AM EDT  Lab work results of come back. Your shortness of breath may also be directly related to the fact that you are retaining fluid.   Please ensure the patient has been taking her daily Lasix 40 mg as prescribed. If she reports adherence then we will increase her Lasix to 40 mg twice daily spaces out at least 6 hours apart. She needs to take this for 5 days. She also needs to follow-up with primary care. She needs to be weighing herself daily as well as avoiding salt. Please route results to primary care.   If patient needs additional Lasix tablets in order to make this change. Okay to send in a small prescription of 10 40 mg tablets   Keep planned follow-up with our office in 2 weeks.   Wyn Quaker, FNP ---------------------------------------------------- [facility] x1 for pt.

## 2019-02-14 ENCOUNTER — Other Ambulatory Visit: Payer: Self-pay | Admitting: *Deleted

## 2019-02-14 NOTE — Patient Outreach (Signed)
[facility]) Care Management  02/14/2019  Andrea Perry [birth year] [id number]   Referral Date: 02/09/2019 Referral Source: MD office (pulmonology) Referral Reason: COPD, medication costs / access  Insurance: [place] attempt # 1, unsuccessful.  Call placed to member for telephone screening and assessment of needs.  No answer, HIPAA compliant voice message left.    Plan: RN CM will send outreach letter and follow up within the next 3-4 business days.  Valente David, [place], MSN [place] [phone]

## 2019-02-16 NOTE — Telephone Encounter (Signed)
LMTCB x2 for pt 

## 2019-02-17 ENCOUNTER — Ambulatory Visit: Payer: Medicare Other | Admitting: Physical Therapy

## 2019-02-17 ENCOUNTER — Other Ambulatory Visit: Payer: Self-pay

## 2019-02-17 ENCOUNTER — Other Ambulatory Visit: Payer: Self-pay | Admitting: *Deleted

## 2019-02-17 ENCOUNTER — Encounter: Payer: Self-pay | Admitting: Physical Therapy

## 2019-02-17 VITALS — HR 98

## 2019-02-17 DIAGNOSIS — M6281 Muscle weakness (generalized): Secondary | ICD-10-CM | POA: Diagnosis not present

## 2019-02-17 DIAGNOSIS — M25511 Pain in right shoulder: Secondary | ICD-10-CM | POA: Diagnosis not present

## 2019-02-17 DIAGNOSIS — R2689 Other abnormalities of gait and mobility: Secondary | ICD-10-CM

## 2019-02-17 DIAGNOSIS — G8929 Other chronic pain: Secondary | ICD-10-CM

## 2019-02-17 DIAGNOSIS — M25512 Pain in left shoulder: Secondary | ICD-10-CM

## 2019-02-17 NOTE — Telephone Encounter (Signed)
Letter will be sent to patient today.

## 2019-02-17 NOTE — Telephone Encounter (Signed)
Can you please send a letter to the patient notifying that she needs to contact her office.  If the patient does call back we also need to ensure that we provide the [facility] nurses contact information who is also been attempting to reach her:  Valente David, Therapist, sports, MSN [place] Manager [place], [place]

## 2019-02-17 NOTE — Telephone Encounter (Signed)
LMTCB x3 for pt. We have attempted to contact pt several times with no success or call back from pt. Per triage protocol, message will be closed.   

## 2019-02-17 NOTE — Patient Outreach (Signed)
[facility]) Care Management  02/17/2019  Andrea Perry 11-01-1950 [id number]   Referral Date: 02/09/2019 Referral Source: MD office (pulmonology) Referral Reason: COPD, medication costs / access  Insurance: [place] attempt # 2, unsuccessful.  Call placed to member for telephone screening and assessment of needs.  No answer, HIPAA compliant voice message left.  Will follow up within the next 3-4 business days.  Valente David, [place], MSN [place] [phone]

## 2019-02-17 NOTE — Therapy (Signed)
[place], [place], [address] Phone: [phone]   Fax:  [phone]  Physical Therapy Treatment  Patient Details  Name: Andrea Perry MRN: [id number] Date of Birth: 1950-05-06 Referring Provider (PT): Lilia Argue, DO   Encounter Date: 02/17/2019  PT End of Session - 02/17/19 1106    Visit Number  5    Number of Visits  13    Date for PT Re-Evaluation  03/02/19    Authorization Type  BCBS Medicare    PT Start Time  1059    PT Stop Time  1140    PT Time Calculation (min)  41 min    Activity Tolerance  Patient limited by fatigue    Behavior During Therapy  [facility] for tasks assessed/performed       Past Medical History:  Diagnosis Date  . ALLERGIC RHINITIS 08/16/2008  . ANXIETY 08/16/2008  . Arthritis    hands, knees, lower back  . ASTHMA 08/16/2008  . Asthma    BRONCHITIS     DR. Lamonte Sakai    . Bladder leak   . Cancer ([place])    MELANOMA      . Chronic lower back pain    problems with disc L2-5  . COPD 08/16/2008  . DEPRESSION 08/16/2008  . Diabetes mellitus without complication ([place])   . Dysrhythmia    hx AF  . Excessive daytime sleepiness 06/19/2015  . GENITAL HERPES 12/03/2006  . GERD (gastroesophageal reflux disease)   . H/O hiatal hernia   . HEMATOCHEZIA 06/20/2009  . Hemorrhoids   . History of cardioversion 10/30/14  . History of kidney stones   . HYPERLIPIDEMIA 08/16/2008  . HYPERTENSION 12/03/2006  . Hypertension   . Impaired glucose tolerance 09/21/2010  . Overactive bladder 10/20/2015  . Pneumonia    as a child  . Pulmonary HTN ([place]) 06/19/2015  . Shortness of breath dyspnea   . Sleep apnea    MILD NO CPAP ORDERED  . Snoring 06/19/2015    Past Surgical History:  Procedure Laterality Date  . ABDOMINAL HYSTERECTOMY    . APPENDECTOMY    . CARDIOVERSION N/A 03/20/2014   Procedure: CARDIOVERSION;  Surgeon: Josue Hector, MD;  Location: [facility] ENDOSCOPY;  Service: Cardiovascular;  Laterality: N/A;   . CARDIOVERSION N/A 10/30/2014   Procedure: CARDIOVERSION;  Surgeon: Josue Hector, MD;  Location: [facility] ENDOSCOPY;  Service: Cardiovascular;  Laterality: N/A;  . CARPAL TUNNEL RELEASE     Right + LEFT  . CESAREAN SECTION     x 1   . COLONOSCOPY  2004  . CYST EXCISION     RT ARM   . CYSTOCELE REPAIR N/A 10/25/2012   Procedure: ANTERIOR REPAIR (CYSTOCELE);  Surgeon: Gus Height, MD;  Location: [facility];  Service: Gynecology;  Laterality: N/A;  . HEEL SPUR SURGERY Bilateral   . KNEE SURGERY     Right + LEFT  . LUMBAR LAMINECTOMY/DECOMPRESSION MICRODISCECTOMY N/A 03/19/2016   Procedure: Laminectomy and Foraminotomy - Lumbar three-four - Lumbar four-five;  Surgeon: Eustace Moore, MD;  Location: [place];  Service: Neurosurgery;  Laterality: N/A;  . RADIOLOGY WITH ANESTHESIA Right 11/01/2014   Procedure: MRI RIGHT FOREARM;  Surgeon: Medication Radiologist, MD;  Location: [place];  Service: Radiology;  Laterality: Right;  . RADIOLOGY WITH ANESTHESIA Right 08/29/2015   Procedure: MRI - RIGHT FOREARM;  Surgeon: Medication Radiologist, MD;  Location: [place];  Service: Radiology;  Laterality: Right;  . RADIOLOGY WITH ANESTHESIA N/A 12/05/2015  Procedure: MRI SPINE WITHOUT;  Surgeon: Medication Radiologist, MD;  Location: [place];  Service: Radiology;  Laterality: N/A;  . SKIN CANCER EXCISION     BILAT SHOULDERS  . SPLIT NIGHT STUDY  09/04/2015  . TONSILLECTOMY AND ADENOIDECTOMY      Vitals:   02/17/19 1107  Pulse: 98  SpO2: 95%    Subjective Assessment - 02/17/19 1104    Subjective  No pain immediately pre-tx. but pt. reports her knees and shoulders have been exacerbated recently with damp and cold weather. Regarding heart rate (elevated last session) pt. reports no further issues and attributes to medication side effect from inhaler. For therapy she reports exercises have been helping improve pain, she c/o continued weakness, however, in her legs.    Pertinent History  diabetic, asthma, COPD, chronic LBP,  knee OA, knee sx., heel sx.    Currently in Pain?  No/denies         [facility] PT Assessment - 02/17/19 0001      Strength   Right Knee Flexion  5/5    Right Knee Extension  5/5    Left Knee Flexion  5/5    Left Knee Extension  5/5                   [facility] Adult PT Treatment/Exercise - 02/17/19 0001      Knee/Hip Exercises: Aerobic   Nustep  L4 x 5 min UE/LE      Knee/Hip Exercises: Standing   Heel Raises  Both;20 reps    Hip Abduction  Right;Left;10 reps    Forward Step Up  Right;Left;1 set;10 reps;Hand Hold: 2;Step Height: 4"    Functional Squat Limitations  squat at counter to chair touch 2x10, UE support at sink    Other Standing Knee Exercises  deadlift 10 lb. KB from 10 in. height to standing x 10 reps      Shoulder Exercises: Standing   External Rotation  AROM;Strengthening;Both;20 reps    Theraband Level (Shoulder External Rotation)  Level 2 (Red)    Internal Rotation  AROM;Strengthening;Right;Left;20 reps    Theraband Level (Shoulder Internal Rotation)  Level 3 (Green)    Internal Rotation Limitations  cues to keep elbow at side and for correct angle of elbow flexion    Extension  AROM;Strengthening;Both;20 reps    Theraband Level (Shoulder Extension)  Level 3 (Green)    Row  AROM;Strengthening;Both;20 reps    Theraband Level (Shoulder Row)  Level 4 (Blue)    Row Limitations  cueing for correct angle of elbow flexion    Other Standing Exercises  wall push up x 15 reps             PT Education - 02/17/19 1108    Education Details  exercises, POC    Person(s) Educated  Patient    Methods  Explanation;Demonstration;Verbal cues    Comprehension  Verbalized understanding;Returned demonstration       PT Short Term Goals - 12/09/18 0714      PT SHORT TERM GOAL #1   Title  STG =LTG        PT Long Term Goals - 02/17/19 1140      PT LONG TERM GOAL #1   Title  Patient will decrease TUG time to 14 sec or less to decrease fall risk    Baseline   18.5 sec at last assessment-not retested today    Time  6    Period  Weeks    Status  On-going      PT LONG TERM GOAL #2   Title  Patient will increase bilat. active left shoulder flexion to 135 degrees in order to perfrom IADL's    Baseline  left 150 deg, right 140 deg    Time  6    Period  Weeks    Status  Achieved      PT LONG TERM GOAL #3   Title  Patient will perfrom sit to stand test in 10 seconds in order to demonstrate improved functional mobility    Baseline  14 sec at last assessment-not retested today    Time  6    Period  Weeks    Status  On-going      PT LONG TERM GOAL #4   Title  Patient will be independent with UE/LE strength and conditioning program    Baseline  ongoing    Time  6    Period  Weeks    Status  On-going      PT LONG TERM GOAL #5   Title  Increase UE/LE strength grossly 1/2 MMT grade from current status to improve ability for lifting for chores/carrying groceries, improved ability sit>stand from low chairs and improved safety with gait    Baseline  knees 5/5 bilat., UE and hips not retested today    Time  6    Period  Weeks    Status  On-going            Plan - 02/17/19 1148    Clinical Impression Statement  Still limited by fatigue but activity tolerance improved from status at last visit with recent COPD exacerbation. Functionally showing improvement with increased knee strength from baseline status and reaching ability with bilat. UE continues to improve as well.    Personal Factors and Comorbidities  Comorbidity 1;Comorbidity 2;Comorbidity 3+;Behavior Pattern    Comorbidities  obesity, dyspnea with exertion, multi joint OA    Examination-Activity Limitations  Squat;Stairs;Stand;Locomotion Level    Examination-Participation Restrictions  Proofreader;Yard Work    Stability/Clinical Decision Making  Evolving/Moderate complexity    Clinical Decision Making  Moderate    Rehab Potential  Good    PT Frequency  --    1-2x/week   PT Duration  6 weeks    PT Treatment/Interventions  ADLs/Self Care Home Management;Electrical Stimulation;Cryotherapy;Iontophoresis 4mg /ml Dexamethasone;Moist Heat;Ultrasound;DME Instruction;Gait training;Functional mobility training;Stair training;Therapeutic activities;Therapeutic exercise;Neuromuscular re-education;Manual techniques;Passive range of motion;Dry needling;Taping;Patient/family education    PT Next Visit Plan  monitor vitals as needed, continue shoulder strengthening/stabilization and LE strengthening as tolerated    Consulted and Agree with Plan of Care  Patient       Patient will benefit from skilled therapeutic intervention in order to improve the following deficits and impairments:  Abnormal gait, Decreased mobility, Difficulty walking, Decreased activity tolerance, Decreased strength, Impaired UE functional use, Postural dysfunction, Pain, Decreased range of motion  Visit Diagnosis: Chronic left shoulder pain  Chronic right shoulder pain  Muscle weakness (generalized)  Other abnormalities of gait and mobility     Problem List Patient Active Problem List   Diagnosis Date Noted  . Healthcare maintenance 02/09/2019  . Medication management 12/01/2018  . Shortness of breath 12/01/2018  . Former smoker 12/01/2018  . Atrial fibrillation, rapid ([place]) 04/29/2018  . COPD exacerbation ([place]) 04/29/2018  . Acute gastroenteritis 03/23/2018  . GERD (gastroesophageal reflux disease) 01/03/2018  . Proteinuria 09/15/2017  . Insomnia 03/17/2017  . Rash 11/04/2016  . Hematoma 11/04/2016  . Pre-operative examination for internal medicine  09/11/2016  . S/P lumbar laminectomy 03/19/2016  . Overactive bladder 10/20/2015  . Pulmonary HTN ([place]) 06/19/2015  . Mild obstructive sleep apnea 06/19/2015  . Snoring 06/19/2015  . Abdominal pain 04/18/2015  . Cough 03/08/2015  . A-fib ([place]) 02/05/2014  . Preop cardiovascular exam 02/05/2014  . Arthritis of knee  02/05/2014  . Arm pain, right 08/31/2013  . Low back pain 09/26/2011  . Leg pain 07/07/2011  . Dizziness 07/07/2011  . Menopausal disorder 07/07/2011  . Hemorrhage of rectum and anus 11/04/2010  . Internal thrombosed hemorrhoids 11/04/2010  . Internal hemorrhoids with other complication 123456  . External hemorrhoids without mention of complication 123456  . Dyspepsia 09/24/2010  . Peripheral edema 09/24/2010  . Diabetes ([place]) 09/21/2010  . Encounter for preventative adult health care exam with abnormal findings 09/21/2010  . IBS 06/20/2009  . HEMATOCHEZIA 06/20/2009  . Elevated lipids 08/16/2008  . Anxiety state 08/16/2008  . Depression 08/16/2008  . Allergic rhinitis 08/16/2008  . ASTHMA 08/16/2008  . COPD (chronic obstructive pulmonary disease) ([place]) 08/16/2008  . GENITAL HERPES 12/03/2006  . Hypothyroidism 12/03/2006  . Essential hypertension 12/03/2006    Beaulah Dinning, PT, DPT 02/17/19 11:53 AM  [facility] [address] [place], [place], [address] Phone: [phone]   Fax:  [phone]  Name: NARGIS REDLINGER MRN: [id number] Date of Birth: 1951-02-13

## 2019-02-21 NOTE — Progress Notes (Signed)
Subjective:  Patient is a 68 year old female patient of Dr. Lamonte Sakai referred by Wyn Quaker, FNP-C, to see [facility] Pulmonary pharmacy team for inhaler education and financial assistance. Past medical history includes COPD with asthmatic component, history of cotton dust exposure, mild obstructive sleep apnea (not currently managed on CPAP), allergic rhinitis, hypothyroidism, dyspepsia, low back pain, abdominal pain, diabetes, IBS, hypertension, depression, anxiety, GERD, genital herpes, overactive bladder, arthritis, hematochezia, and s/p lumbar laminectomy.  Pt presents for initial appt. She states cost is a barrier for obtaining her pulmonary medications, as well as other medications. Specifically, patient has not taken apixaban in 2 months because she is unable to afford it.  Pulmonary medications  Current: Breztri Aerosphere 160-9-4.8 mcg 2 puffs twice daily, albuterol prn, duoneb 3 mL four times daily  Tried in past: Stiolto Respimat (cost), Cisco (patient is unsure), Symbicort (cost), Advair Diskus (cost), Spiriva  respimat (cost)   Patient reports adherence   Social history: former smoker (Quit 2005; 45-pack-year smoking history), current alcohol use  ER visits in last 6 months: 0 Hospitalizations: 0  CAT ASSESSMENT  Rank each of the following items on a scale of 0 to 5 (with 5 being most severe) Write a # 0-5 in each box  I never cough (0) > I cough all the time (5) 2.5  I have no phlegm (mucus) in my chest (0) > My chest is completely full of phlegm (mucus) (5) 0  My chest does not feel tight at all (0) > My chest feels very tight (5) 0  When I walk up a hill or one flight of stairs I am not breathless (0) > When I walk up a hill or one flight of stairs I am very breathless (5) 3  I am not limited doing any activities at home (0) > I am very limited doing activities at home (5) 0  I am confident leaving my home despite my lung function (0) > I am not at all confident leaving  my home because of my lung condition (5)  2  I sleep soundly (0) > I don't sleep soundly because of my lung condition (5) 3  I have lots of energy (0) > I have no energy at all (5) 2.5   Total CAT Score: 13   Objective: Allergies  Allergen Reactions  . Tramadol Hcl Nausea And Vomiting and Other (See Comments)     mouth dryness, headache  . Codeine Nausea And Vomiting and Nausea Only  . Tape Rash    Plastic tape, bandaids and ekg leads, causes redness and rash  . Vicodin [Hydrocodone-Acetaminophen] Nausea And Vomiting    Outpatient Encounter Medications as of 02/24/2019  Medication Sig  . albuterol (PROVENTIL HFA;VENTOLIN HFA) 108 (90 Base) MCG/ACT inhaler Inhale 2 puffs by mouth every 6 hours as needed for wheezing or shortness of breath. **Yearly physical due in June**  . amLODipine (NORVASC) 10 MG tablet Take 1 tablet by mouth once daily  . Blood Glucose Monitoring Suppl (FREESTYLE LITE) DEVI Use as directed daily E11.9  . Budeson-Glycopyrrol-Formoterol (BREZTRI AEROSPHERE) 160-9-4.8 MCG/ACT AERO Inhale 2 puffs into the lungs 2 (two) times daily.  . cetirizine (ZYRTEC) 10 MG tablet Take 10 mg by mouth daily.   . Cholecalciferol (VITAMIN D) 2000 units tablet Take 2,000 Units by mouth daily.  . Cyanocobalamin (VITAMELTS ENERGY VITAMIN B-12) 1500 MCG TBDP Take 1,500 mcg by mouth daily.  Marland Kitchen dimenhyDRINATE (DRAMAMINE) 50 MG tablet Take 50 mg by mouth daily  as needed for nausea.  . diphenoxylate-atropine (LOMOTIL) 2.5-0.025 MG tablet Take 1 tablet by mouth 4 (four) times daily as needed for diarrhea or loose stools.  Marland Kitchen ELIQUIS 5 MG TABS tablet TAKE 1 TABLET BY MOUTH TWICE DAILY  . escitalopram (LEXAPRO) 20 MG tablet Take 1 tablet (20 mg total) by mouth daily.  Marland Kitchen estradiol (ESTRACE) 2 MG tablet Take 1 tablet (2 mg total) by mouth daily.  . flecainide (TAMBOCOR) 50 MG tablet TAKE 1 TABLET BY MOUTH TWICE DAILY PLEASE  KEEP  UPCOMING  APPOINTMENT  FOR  FURTHER  REFILLS  THANK  YOU  .  fluticasone (FLONASE) 50 MCG/ACT nasal spray Place 2 sprays into both nostrils daily.  . furosemide (LASIX) 40 MG tablet Take 1 tablet by mouth once daily  . Garlic 123XX123 MG CAPS Take 1,000 mg by mouth at bedtime.   Marland Kitchen glucose blood (FREESTYLE LITE) test strip Use as instructed once daily E11.9  . guaiFENesin (MUCINEX) 600 MG 12 hr tablet Take 1 tablet (600 mg total) by mouth 2 (two) times daily as needed for cough or to loosen phlegm.  Marland Kitchen ipratropium-albuterol (DUONEB) 0.5-2.5 (3) MG/3ML SOLN USE 3 ML IN NEBULIZER  4 TIMES DAILY  . irbesartan (AVAPRO) 150 MG tablet Take 1 tablet by mouth once daily  . Lancets MISC Use as directed once daily E11.9  . levothyroxine (SYNTHROID) 25 MCG tablet Take 1 tablet (25 mcg total) by mouth daily before breakfast.  . LORazepam (ATIVAN) 1 MG tablet Take 1 tablet by mouth twice per day as needed for anxiety.  . meloxicam (MOBIC) 15 MG tablet Take one pill a day with food for 7 days and then prn thereafter  . metFORMIN (GLUCOPHAGE) 500 MG tablet Take 1 tablet (500 mg total) by mouth daily with breakfast.  . metoprolol tartrate (LOPRESSOR) 50 MG tablet TAKE 1.5 TABLET BY MOUTH 2 TIMES A DAY  . nystatin (MYCOSTATIN/NYSTOP) powder Use as directed twice daily as needed  . Omega-3 Fatty Acids (FISH OIL) 1200 MG CAPS Take 1,200 mg by mouth daily.   . ondansetron (ZOFRAN) 4 MG tablet Take 1 tablet (4 mg total) by mouth every 8 (eight) hours as needed for nausea or vomiting.  . pantoprazole (PROTONIX) 40 MG tablet Take 1 tablet by mouth once daily  . potassium chloride SA (K-DUR,KLOR-CON) 20 MEQ tablet Take 1 tablet (20 mEq total) by mouth daily.  . pravastatin (PRAVACHOL) 40 MG tablet Take 1 tablet by mouth once daily  . Tetrahydrozoline HCl (VISINE OP) Place 1 drop into both eyes daily as needed (dry eyes).  Marland Kitchen tiZANidine (ZANAFLEX) 2 MG tablet Take 1 tablet (2 mg total) by mouth every 6 (six) hours as needed for muscle spasms.  Marland Kitchen tolterodine (DETROL LA) 4 MG 24 hr  capsule Take 4 mg by mouth daily.  . vitamin E 400 UNIT capsule Take 400 Units by mouth daily.  Marland Kitchen zolpidem (AMBIEN) 5 MG tablet Take 1 tablet (5 mg total) by mouth at bedtime as needed for sleep.   No facility-administered encounter medications on file as of 02/24/2019.      Immunization History  Administered Date(s) Administered  . Fluad Quad(high Dose 65+) 02/09/2019  . Influenza, High Dose Seasonal PF 03/23/2018  . Influenza,inj,Quad PF,6+ Mos 03/01/2013, 02/14/2014, 03/08/2015, 04/16/2016, 01/12/2017  . Influenza-Unspecified 01/18/2017  . Pneumococcal Conjugate-13 03/30/2013  . Pneumococcal Polysaccharide-23 04/04/2014  . Tdap 09/24/2010     Pulmonary Function Test 02/09/2019 - FVC 1.48 (53% predicted), postbronchodilator ratio 58, postbronchodilator FEV1  1.07 (50% predicted), positive bronchodilator response, significant mid flow reversibility, DLCO 12.93 (72% predicted)   CBC 03/23/2018 - eosinophils relative 1.8, eosinophils absolute 0.2  Chest X-ray 04/30/2018 - no acute cardiopulmonary process, left basilar atelectasis  Assessment and Plan:  1. Inhaler Education (Breztri Aerosphere 160-9-4.8 mcg 2 puffs twice daily)  Patient was counseled on the purpose, proper use, and adverse effects of Breztri inhaler.  Instructed patient to rinse mouth with water after using in order to prevent fungal infection.  Patient verbalized understanding.  Reviewed appropriate use of maintenance vs rescue inhalers.  Stressed importance of using maintenance inhaler daily and rescue inhaler only as needed.  Patient verbalized understanding.  Demonstrated proper inhaler technique using Breztri demo inhaler.  Patient able to demonstrate proper inhaler technique using teach back method.Patient was given sample in office today (Lot number: [id number]; Expiration date: 08/2020)  2. Financial Assistance Breztri (AZ&me)  -Qualifications: Annual income(household of 2 makes <$48,000), denial from low  income subsidy (LIS), spend 3% of annual income on out of pocket expenses. -Patient makes $1366/month [phone] annually) -She has not applied to LIS -She is unsure how much money she spends on prescriptions per year (will need to spend ~$491 to qualify) Plan: Have patient apply for LIS to see if she qualifies for AZ&me (if accepted - will help with cost of her inhalers and other medications; if denied will apply for AZ&me for pulmonary medications (also referred to Seniors? Health Insurance Information Program [facility]) to help with coverage of other medications). Afterwards, instructed pt to call pharmacy line at [facility] for further instruction. Pt verbalized understanding.  Medicaid Patient also had questions regarding Medicaid qualifications. Printed out application and provided pt with phone number to call to speak with a Medicaid representative.  3. Medication Reconciliation -Apixaban (Eliquis): Patient stated she has not taken her apixaban in 2 months due to not being able to afford it. Stressed to patient the importance of taking this medication to prevent VTE. Will defer to cardiologist. Recommended pt f/u with cardiologist as well. Pt verbalized understanding.  -Garlic: Discussed with pt that this medication can increase her risk of bleeding in combination with apixaban. Advised pt to discontinue medication. Pt verbalized understanding and stated she would discontinue it.  Thank you for involving pharmacy to assist in providing Ms. Arroyave's care.   Drexel Iha, PharmD PGY2 Ambulatory Care Pharmacy Resident

## 2019-02-21 NOTE — Patient Instructions (Addendum)
It was a pleasure seeing you in clinic today Ms. Ciresi!  Today the plan is...  1. Apply for Low Income Subsidy/look into applying to Medicaid. Contact pharmacy (phone number below) after you are done and we will go from there.   2. Contact Seniors? Health Insurance Information Program [facility]) to make an appointment about choosing the best insurance plan.  3. Continue using Breztri Aerosphere 2 puffs twice per day. Please remember to rinse your mouth out after.   Please call the PharmD clinic at [phone] if you have any questions that you would like to speak with a pharmacist about Stanton Kidney, Museum/gallery conservator).

## 2019-02-23 ENCOUNTER — Other Ambulatory Visit: Payer: Self-pay | Admitting: *Deleted

## 2019-02-23 NOTE — Patient Outreach (Signed)
[facility]) Care Management  02/23/2019  Yaneiry Cullimore Frutiger 1950/12/21 [id number]   Referral Date:02/09/2019 Referral Source:MD office (pulmonology) Referral Reason:COPD, medication costs / access Insurance:BCBS   Outreach attempt #3, unsuccessful.  Call placed to member for telephone screening and assessment of needs. No answer, HIPAA compliant voice message left.  If no call back by 11/10 will close case due to inability to establish contact.  Valente David, [place], MSN [place] [phone]

## 2019-02-24 ENCOUNTER — Other Ambulatory Visit: Payer: Self-pay

## 2019-02-24 ENCOUNTER — Ambulatory Visit (INDEPENDENT_AMBULATORY_CARE_PROVIDER_SITE_OTHER): Payer: Medicare Other | Admitting: Pharmacist

## 2019-02-24 ENCOUNTER — Encounter: Payer: Medicare Other | Admitting: Physical Therapy

## 2019-02-24 DIAGNOSIS — J438 Other emphysema: Secondary | ICD-10-CM | POA: Diagnosis not present

## 2019-02-26 NOTE — Progress Notes (Signed)
_0  ID: [name], female    DOB: 08-26-1950, 68 y.o.   MRN: [id number]  Chief Complaint  Patient presents with  . Follow-up    2 week f/u, states her breathing has been improved since last visit.     Referring provider: Biagio Borg, MD  HPI:  68 year old female former smoker followed in our office for COPD with asthmatic component, history of cotton dust exposure, mild obstructive sleep apnea not currently managed on CPAP  PMH: Allergic rhinitis, hypothyroidism, dyspepsia, low back pain, abdominal pain, diabetes, IBS, hypertension, depression, anxiety Smoker/ Smoking History: Former smoker.  Quit 2005.  45-pack-year smoking history. Maintenance:  Breztri  Pt of: Dr. Lamonte Sakai  02/27/2019  - Visit   68 year old female former smoker presenting to our office today as a follow-up visit.  Since previous office visit we have had difficulty getting in touch with the patient.  She has had lab work that we have completed in our office that showed elevated fluid levels.  She has successfully completed follow-up with our clinical pharmacy team.  Patient reported that breztri did help after starting that after stopping Stiolto Respimat.  She also met with our clinical pharmacy team where they reviewed her medications were patient admitted that she has not taken her Eliquis for about 2 months.  She has upcoming follow-up scheduled with cardiology.  We were unable to successfully contact the patient to increase the patient's Lasix 40 mg twice daily for 5 days based off of her proBNP result.  Patient continues to struggle with following up and management of her overall health care.  [facility] continues to try to contact the patient to help with patient's management of care as well as access to medications.  The patient has received these telephone calls and she has not called them back yet.  Questionaires / Pulmonary Flowsheets:   MMRC: mMRC Dyspnea Scale mMRC Score  02/27/2019 2   02/09/2019 3   Tests:   04/30/2018-chest x-ray-no acute cardiopulmonary process, left basilar atelectasis  10/05/2017-echocardiogram-study was not technically sufficient to allow evaluation of LV diastolic dysfunction due to A. fib, pulmonary artery systolic pressure was within normal range  09/04/2015-split-night sleep study- AHI 4.9  01/11/2014-spirometry- FVC 2.3 (83% predicted), ratio 56, FEV1 1.3 (59% predicted)  02/09/2019-pulmonary function test-FVC 1.48 (53% predicted), postbronchodilator ratio 58, postbronchodilator FEV1 1.07 (50% predicted), positive bronchodilator response, significant mid flow reversibility, DLCO 12.93 (72% predicted)   03/23/2018-CBC with differential-eosinophils relative 1.8, eosinophils absolute 0.2  03/17/2017-CBC with differential-eosinophils relative 3.1, eosinophils absolute 0.3   FENO:  No results found for: NITRICOXIDE  PFT: PFT Results Latest Ref Rng & Units 02/09/2019  FVC-Pre L 1.48  FVC-Predicted Pre % 53  FVC-Post L 1.84  FVC-Predicted Post % 66  Pre FEV1/FVC % % 45  Post FEV1/FCV % % 58  FEV1-Pre L 0.66  FEV1-Predicted Pre % 31  FEV1-Post L 1.07  DLCO UNC% % 72  DLCO COR %Predicted % 81  TLC L 4.73  TLC % Predicted % 102  RV % Predicted % 152    WALK:  SIX MIN WALK 02/09/2019 01/11/2014  Supplimental Oxygen during Test? (L/min) No No  Tech Comments: Patient was able to complete 1 lap while walking at a leisurely pace. Patient did stop 3 times during walk to catch her breath. Denied any chest pain but did have some lower back pain. Patient stated that the lower back pain is not new. During walk, patient's heart rate jumped around from 188,  198, 168, 88 to 154    Imaging: No results found.  Lab Results:  CBC    Component Value Date/Time   WBC 11.7 (H) 02/09/2019 1445   RBC 4.34 02/09/2019 1445   HGB 13.2 02/09/2019 1445   HCT 40.8 02/09/2019 1445   PLT 269.0 02/09/2019 1445   MCV 94.0 02/09/2019 1445   MCH 31.2  03/18/2016 1026   MCHC 32.4 02/09/2019 1445   RDW 15.2 02/09/2019 1445   LYMPHSABS 3.0 02/09/2019 1445   MONOABS 0.8 02/09/2019 1445   EOSABS 0.2 02/09/2019 1445   BASOSABS 0.1 02/09/2019 1445    BMET    Component Value Date/Time   NA 143 02/09/2019 1445   NA 143 09/08/2016 1427   K 4.4 02/09/2019 1445   CL 104 02/09/2019 1445   CO2 28 02/09/2019 1445   GLUCOSE 154 (H) 02/09/2019 1445   BUN 15 02/09/2019 1445   BUN 11 09/08/2016 1427   CREATININE 0.87 02/09/2019 1445   CALCIUM 8.8 02/09/2019 1445   GFRNONAA 97 09/08/2016 1427   GFRAA 112 09/08/2016 1427    BNP No results found for: BNP  ProBNP    Component Value Date/Time   PROBNP 1,611 (H) 02/09/2019 1445    Specialty Problems      Pulmonary Problems   Allergic rhinitis    Qualifier: Diagnosis of  By: Jenny Reichmann MD, Hunt Oris       COPD (chronic obstructive pulmonary disease) ([place])    Qualifier: Diagnosis of  By: Jenny Reichmann MD, Hunt Oris       Cough   Mild obstructive sleep apnea    09/04/2015-split-night sleep study- AHI 4.9      Snoring   COPD exacerbation ([facility])   Shortness of breath      Allergies  Allergen Reactions  . Tramadol Hcl Nausea And Vomiting and Other (See Comments)     mouth dryness, headache  . Codeine Nausea And Vomiting and Nausea Only  . Tape Rash    Plastic tape, bandaids and ekg leads, causes redness and rash  . Vicodin [Hydrocodone-Acetaminophen] Nausea And Vomiting    Immunization History  Administered Date(s) Administered  . Fluad Quad(high Dose 65+) 02/09/2019  . Influenza, High Dose Seasonal PF 03/23/2018  . Influenza,inj,Quad PF,6+ Mos 03/01/2013, 02/14/2014, 03/08/2015, 04/16/2016, 01/12/2017  . Influenza-Unspecified 01/18/2017  . Pneumococcal Conjugate-13 03/30/2013  . Pneumococcal Polysaccharide-23 04/04/2014  . Tdap 09/24/2010    Past Medical History:  Diagnosis Date  . ALLERGIC RHINITIS 08/16/2008  . ANXIETY 08/16/2008  . Arthritis    hands, knees, lower back  .  ASTHMA 08/16/2008  . Asthma    BRONCHITIS     DR. Lamonte Sakai    . Bladder leak   . Cancer ([place])    MELANOMA      . Chronic lower back pain    problems with disc L2-5  . COPD 08/16/2008  . DEPRESSION 08/16/2008  . Diabetes mellitus without complication ([place])   . Dysrhythmia    hx AF  . Excessive daytime sleepiness 06/19/2015  . GENITAL HERPES 12/03/2006  . GERD (gastroesophageal reflux disease)   . H/O hiatal hernia   . HEMATOCHEZIA 06/20/2009  . Hemorrhoids   . History of cardioversion 10/30/14  . History of kidney stones   . HYPERLIPIDEMIA 08/16/2008  . HYPERTENSION 12/03/2006  . Hypertension   . Impaired glucose tolerance 09/21/2010  . Overactive bladder 10/20/2015  . Pneumonia    as a child  . Pulmonary HTN ([place]) 06/19/2015  . Shortness of  breath dyspnea   . Sleep apnea    MILD NO CPAP ORDERED  . Snoring 06/19/2015    Tobacco History: Social History   Tobacco Use  Smoking Status Former Smoker  . Packs/day: 1.50  . Years: 30.00  . Pack years: 45.00  . Types: Cigarettes  . Quit date: 04/21/2003  . Years since quitting: 15.8  Smokeless Tobacco Never Used   Counseling given: Yes   Continue to not smoke  Outpatient Encounter Medications as of 02/27/2019  Medication Sig  . albuterol (PROVENTIL HFA;VENTOLIN HFA) 108 (90 Base) MCG/ACT inhaler Inhale 2 puffs by mouth every 6 hours as needed for wheezing or shortness of breath. **Yearly physical due in June**  . amLODipine (NORVASC) 10 MG tablet Take 1 tablet by mouth once daily  . Blood Glucose Monitoring Suppl (FREESTYLE LITE) DEVI Use as directed daily E11.9  . Budeson-Glycopyrrol-Formoterol (BREZTRI AEROSPHERE) 160-9-4.8 MCG/ACT AERO Inhale 2 puffs into the lungs 2 (two) times daily.  . cetirizine (ZYRTEC) 10 MG tablet Take 10 mg by mouth daily.   . Cholecalciferol (VITAMIN D) 2000 units tablet Take 2,000 Units by mouth daily.  . Cyanocobalamin (VITAMELTS ENERGY VITAMIN B-12) 1500 MCG TBDP Take 1,500 mcg by mouth daily.  Marland Kitchen  dimenhyDRINATE (DRAMAMINE) 50 MG tablet Take 50 mg by mouth daily as needed for nausea.  . diphenoxylate-atropine (LOMOTIL) 2.5-0.025 MG tablet Take 1 tablet by mouth 4 (four) times daily as needed for diarrhea or loose stools.  Marland Kitchen ELIQUIS 5 MG TABS tablet TAKE 1 TABLET BY MOUTH TWICE DAILY  . escitalopram (LEXAPRO) 20 MG tablet Take 1 tablet (20 mg total) by mouth daily.  Marland Kitchen estradiol (ESTRACE) 2 MG tablet Take 1 tablet (2 mg total) by mouth daily.  . flecainide (TAMBOCOR) 50 MG tablet TAKE 1 TABLET BY MOUTH TWICE DAILY PLEASE  KEEP  UPCOMING  APPOINTMENT  FOR  FURTHER  REFILLS  THANK  YOU  . fluticasone (FLONASE) 50 MCG/ACT nasal spray Place 2 sprays into both nostrils daily.  . furosemide (LASIX) 40 MG tablet Take 1 tablet by mouth once daily  . glucose blood (FREESTYLE LITE) test strip Use as instructed once daily E11.9  . guaiFENesin (MUCINEX) 600 MG 12 hr tablet Take 1 tablet (600 mg total) by mouth 2 (two) times daily as needed for cough or to loosen phlegm.  Marland Kitchen ipratropium-albuterol (DUONEB) 0.5-2.5 (3) MG/3ML SOLN USE 3 ML IN NEBULIZER  4 TIMES DAILY  . irbesartan (AVAPRO) 150 MG tablet Take 1 tablet by mouth once daily  . Lancets MISC Use as directed once daily E11.9  . levothyroxine (SYNTHROID) 25 MCG tablet Take 1 tablet (25 mcg total) by mouth daily before breakfast.  . LORazepam (ATIVAN) 1 MG tablet Take 1 tablet by mouth twice per day as needed for anxiety.  . metFORMIN (GLUCOPHAGE) 500 MG tablet Take 1 tablet (500 mg total) by mouth daily with breakfast.  . metoprolol tartrate (LOPRESSOR) 50 MG tablet TAKE 1.5 TABLET BY MOUTH 2 TIMES A DAY  . nystatin (MYCOSTATIN/NYSTOP) powder Use as directed twice daily as needed  . Omega-3 Fatty Acids (FISH OIL) 1200 MG CAPS Take 1,200 mg by mouth daily.   . ondansetron (ZOFRAN) 4 MG tablet Take 1 tablet (4 mg total) by mouth every 8 (eight) hours as needed for nausea or vomiting.  . pantoprazole (PROTONIX) 40 MG tablet Take 1 tablet by mouth once  daily  . potassium chloride SA (K-DUR,KLOR-CON) 20 MEQ tablet Take 1 tablet (20 mEq total) by mouth daily.  Marland Kitchen  pravastatin (PRAVACHOL) 40 MG tablet Take 1 tablet by mouth once daily  . Tetrahydrozoline HCl (VISINE OP) Place 1 drop into both eyes daily as needed (dry eyes).  Marland Kitchen tiZANidine (ZANAFLEX) 2 MG tablet Take 1 tablet (2 mg total) by mouth every 6 (six) hours as needed for muscle spasms.  Marland Kitchen tolterodine (DETROL LA) 4 MG 24 hr capsule Take 4 mg by mouth daily.  . vitamin E 400 UNIT capsule Take 400 Units by mouth daily.  Marland Kitchen zolpidem (AMBIEN) 5 MG tablet Take 1 tablet (5 mg total) by mouth at bedtime as needed for sleep.  . [DISCONTINUED] Budeson-Glycopyrrol-Formoterol (BREZTRI AEROSPHERE) 160-9-4.8 MCG/ACT AERO Inhale 2 puffs into the lungs 2 (two) times daily.  . [DISCONTINUED] Garlic 1771 MG CAPS Take 1,000 mg by mouth at bedtime.    No facility-administered encounter medications on file as of 02/27/2019.      Review of Systems  Review of Systems  Constitutional: Positive for fatigue. Negative for activity change and fever.  HENT: Negative for sinus pressure, sinus pain and sore throat.   Respiratory: Positive for shortness of breath. Negative for cough and wheezing.   Cardiovascular: Positive for leg swelling. Negative for chest pain and palpitations.  Gastrointestinal: Negative for diarrhea, nausea and vomiting.  Musculoskeletal: Negative for arthralgias.  Neurological: Negative for dizziness.  Psychiatric/Behavioral: Negative for sleep disturbance. The patient is not nervous/anxious.      Physical Exam  BP 126/82   Pulse 60   Temp (!) 97.4 F (36.3 C) (Temporal)   Ht _0  (1.575 m)   Wt 270 lb (122.5 kg)   SpO2 97%   BMI 49.38 kg/m   Wt Readings from Last 5 Encounters:  02/27/19 270 lb (122.5 kg)  02/09/19 268 lb (121.6 kg)  01/17/19 265 lb (120.2 kg)  12/22/18 270 lb (122.5 kg)  12/06/18 288 lb (130.6 kg)    BMI Readings from Last 5 Encounters:  02/27/19 49.38  kg/m  02/09/19 49.02 kg/m  01/17/19 48.47 kg/m  12/22/18 49.38 kg/m  12/06/18 52.68 kg/m    Physical Exam Vitals signs and nursing note reviewed.  Constitutional:      General: She is not in acute distress.    Appearance: Normal appearance. She is obese.  HENT:     Head: Normocephalic and atraumatic.     Right Ear: External ear normal.     Left Ear: External ear normal.     Nose: Nose normal. No congestion.     Mouth/Throat:     Mouth: Mucous membranes are moist.     Pharynx: Oropharynx is clear.  Eyes:     Pupils: Pupils are equal, round, and reactive to light.  Neck:     Musculoskeletal: Normal range of motion.  Cardiovascular:     Rate and Rhythm: Normal rate and regular rhythm.     Pulses: Normal pulses.     Heart sounds: Normal heart sounds. No murmur.  Pulmonary:     Effort: Pulmonary effort is normal. No respiratory distress.     Breath sounds: Normal breath sounds. No decreased air movement. No decreased breath sounds, wheezing or rales.  Abdominal:     General: Abdomen is flat. Bowel sounds are normal.     Palpations: Abdomen is soft.  Musculoskeletal:     Right lower leg: 3+ Pitting Edema present.     Left lower leg: 3+ Pitting Edema present.  Skin:    General: Skin is warm and dry.     Capillary Refill: Capillary  refill takes less than 2 seconds.  Neurological:     General: No focal deficit present.     Mental Status: She is alert and oriented to person, place, and time. Mental status is at baseline.     Gait: Gait normal.  Psychiatric:        Mood and Affect: Mood normal. Affect is flat.        Behavior: Behavior normal.        Thought Content: Thought content normal.        Judgment: Judgment normal.       Assessment & Plan:   A-fib [facility]) Plan: Patient needs to contact cardiology Keep scheduled follow-up with cardiology next week I personally contacted cardiology to notify their office of my concerns of the patient being off of her Eliquis    Pulmonary HTN ([place]) Plan: Lasix 40 mg twice daily for next 5 days, then resume Lasix 40 mg daily Keep scheduled follow-up with cardiology Continue to weigh yourself in the morning Continue to follow low-sodium diet  Allergic rhinitis Plan: Continue Zyrtec  COPD (chronic obstructive pulmonary disease) Pulmonary function testing reviewed today showing strong asthmatic component Patient reports symptomatic improvement when on maintenance inhalers Blood work showing peripheral eosinophilia  Plan: Continue Breztri Continue DuoNeb nebulized medication as needed for shortness of breath or wheezing Emphasized need for the patient to contact [facility] clinical team back, number provided today Close follow-up in office in 2 weeks  Mild obstructive sleep apnea Plan: Continue to monitor clinically May need to consider repeat home sleep study at some point if patient starts to become symptomatic  Complex care coordination Plan: I contacted cardiology to discuss the fact the patient is currently off of Eliquis  I emphasized the need for the patient to contact cardiology as well as to contact their [facility] registered nurse who has been trying to reach her  Former smoker Plan: Continue to not smoke  Medication management Patient struggles with medication affordability as well as access  Plan: Referral to [facility]     Shortness of breath I believe this is likely multifactorial as patient has restrictive lung disease, obstructive lung disease, morbid obesity, physically sedentary, likely diastolic dysfunction, pulmonary hypertension.  These are all likely contributing to her shortness of breath.  Her shortness of breath was improved on maintenance inhalers.  Plan: Continue Breztri inhaler Increase Lasix to 40 mg twice daily for 5 days, continue Lasix 40 mg daily Start weighing yourself regularly 2-week follow-up with our office       Return in about 2 weeks (around 03/13/2019), or if symptoms worsen or fail to improve, for Follow up with Wyn Quaker FNP-C.   Lauraine Rinne, NP 02/27/2019   This appointment was 42 minutes long with over 50% of the time in direct face-to-face patient care, assessment, plan of care, and follow-up.

## 2019-02-27 ENCOUNTER — Telehealth: Payer: Self-pay | Admitting: Cardiovascular Disease

## 2019-02-27 ENCOUNTER — Encounter: Payer: Self-pay | Admitting: Pulmonary Disease

## 2019-02-27 ENCOUNTER — Ambulatory Visit: Payer: Medicare Other | Attending: Sports Medicine | Admitting: Physical Therapy

## 2019-02-27 ENCOUNTER — Other Ambulatory Visit: Payer: Self-pay

## 2019-02-27 ENCOUNTER — Encounter: Payer: Self-pay | Admitting: Physical Therapy

## 2019-02-27 ENCOUNTER — Telehealth: Payer: Self-pay | Admitting: Pulmonary Disease

## 2019-02-27 ENCOUNTER — Ambulatory Visit (INDEPENDENT_AMBULATORY_CARE_PROVIDER_SITE_OTHER): Payer: Medicare Other | Admitting: Pulmonary Disease

## 2019-02-27 VITALS — BP 126/82 | HR 60 | Temp 97.4°F | Ht 62.0 in | Wt 270.0 lb

## 2019-02-27 DIAGNOSIS — J438 Other emphysema: Secondary | ICD-10-CM

## 2019-02-27 DIAGNOSIS — I4819 Other persistent atrial fibrillation: Secondary | ICD-10-CM | POA: Diagnosis not present

## 2019-02-27 DIAGNOSIS — Z7189 Other specified counseling: Secondary | ICD-10-CM | POA: Insufficient documentation

## 2019-02-27 DIAGNOSIS — M25511 Pain in right shoulder: Secondary | ICD-10-CM | POA: Diagnosis not present

## 2019-02-27 DIAGNOSIS — M6281 Muscle weakness (generalized): Secondary | ICD-10-CM | POA: Insufficient documentation

## 2019-02-27 DIAGNOSIS — R0602 Shortness of breath: Secondary | ICD-10-CM

## 2019-02-27 DIAGNOSIS — Z87891 Personal history of nicotine dependence: Secondary | ICD-10-CM

## 2019-02-27 DIAGNOSIS — M25512 Pain in left shoulder: Secondary | ICD-10-CM | POA: Insufficient documentation

## 2019-02-27 DIAGNOSIS — G4733 Obstructive sleep apnea (adult) (pediatric): Secondary | ICD-10-CM | POA: Diagnosis not present

## 2019-02-27 DIAGNOSIS — R2689 Other abnormalities of gait and mobility: Secondary | ICD-10-CM

## 2019-02-27 DIAGNOSIS — Z0000 Encounter for general adult medical examination without abnormal findings: Secondary | ICD-10-CM

## 2019-02-27 DIAGNOSIS — I272 Pulmonary hypertension, unspecified: Secondary | ICD-10-CM

## 2019-02-27 DIAGNOSIS — J301 Allergic rhinitis due to pollen: Secondary | ICD-10-CM

## 2019-02-27 DIAGNOSIS — G8929 Other chronic pain: Secondary | ICD-10-CM | POA: Diagnosis not present

## 2019-02-27 DIAGNOSIS — Z79899 Other long term (current) drug therapy: Secondary | ICD-10-CM

## 2019-02-27 MED ORDER — BREZTRI AEROSPHERE 160-9-4.8 MCG/ACT IN AERO: 2.0000 | INHALATION_SPRAY | Freq: Two times a day (BID) | RESPIRATORY_TRACT | 4 refills | Status: DC

## 2019-02-27 NOTE — Assessment & Plan Note (Signed)
Plan: Patient needs to contact cardiology Keep scheduled follow-up with cardiology next week I personally contacted cardiology to notify their office of my concerns of the patient being off of her Eliquis

## 2019-02-27 NOTE — Telephone Encounter (Signed)
Andrea Quaker, NP at [facility] wanted to make Dr. Johnsie Cancel aware of two important issues: 1. The patient has not been taking her Eliquis for the past 2 months (due to cost) 2. Aaron Edelman increased the dosage of her Lasix to 40mg  BID.  If you need to contact Aaron Edelman, send him a secure chat via Epic and he can discuss things further

## 2019-02-27 NOTE — Assessment & Plan Note (Signed)
Plan: Continue to not smoke 

## 2019-02-27 NOTE — Therapy (Signed)
[place], [place], [address] Phone: [phone]   Fax:  [phone]  Physical Therapy Treatment/Discharge   Patient Details  Name: Andrea Perry MRN: [id number] Date of Birth: July 17, 1950 Referring Provider (PT): Lilia Argue, DO   Encounter Date: 02/27/2019  PT End of Session - 02/27/19 1306    Visit Number  6    Number of Visits  13    Date for PT Re-Evaluation  03/02/19    Authorization Type  BCBS Medicare    PT Start Time  1304    PT Stop Time  1344    PT Time Calculation (min)  40 min    Activity Tolerance  Patient limited by fatigue    Behavior During Therapy  [facility] for tasks assessed/performed       Past Medical History:  Diagnosis Date  . ALLERGIC RHINITIS 08/16/2008  . ANXIETY 08/16/2008  . Arthritis    hands, knees, lower back  . ASTHMA 08/16/2008  . Asthma    BRONCHITIS     DR. Lamonte Sakai    . Bladder leak   . Cancer ([place])    MELANOMA      . Chronic lower back pain    problems with disc L2-5  . COPD 08/16/2008  . DEPRESSION 08/16/2008  . Diabetes mellitus without complication ([place])   . Dysrhythmia    hx AF  . Excessive daytime sleepiness 06/19/2015  . GENITAL HERPES 12/03/2006  . GERD (gastroesophageal reflux disease)   . H/O hiatal hernia   . HEMATOCHEZIA 06/20/2009  . Hemorrhoids   . History of cardioversion 10/30/14  . History of kidney stones   . HYPERLIPIDEMIA 08/16/2008  . HYPERTENSION 12/03/2006  . Hypertension   . Impaired glucose tolerance 09/21/2010  . Overactive bladder 10/20/2015  . Pneumonia    as a child  . Pulmonary HTN ([place]) 06/19/2015  . Shortness of breath dyspnea   . Sleep apnea    MILD NO CPAP ORDERED  . Snoring 06/19/2015    Past Surgical History:  Procedure Laterality Date  . ABDOMINAL HYSTERECTOMY    . APPENDECTOMY    . CARDIOVERSION N/A 03/20/2014   Procedure: CARDIOVERSION;  Surgeon: Josue Hector, MD;  Location: [facility] ENDOSCOPY;  Service: Cardiovascular;   Laterality: N/A;  . CARDIOVERSION N/A 10/30/2014   Procedure: CARDIOVERSION;  Surgeon: Josue Hector, MD;  Location: [facility] ENDOSCOPY;  Service: Cardiovascular;  Laterality: N/A;  . CARPAL TUNNEL RELEASE     Right + LEFT  . CESAREAN SECTION     x 1   . COLONOSCOPY  2004  . CYST EXCISION     RT ARM   . CYSTOCELE REPAIR N/A 10/25/2012   Procedure: ANTERIOR REPAIR (CYSTOCELE);  Surgeon: Gus Height, MD;  Location: [facility];  Service: Gynecology;  Laterality: N/A;  . HEEL SPUR SURGERY Bilateral   . KNEE SURGERY     Right + LEFT  . LUMBAR LAMINECTOMY/DECOMPRESSION MICRODISCECTOMY N/A 03/19/2016   Procedure: Laminectomy and Foraminotomy - Lumbar three-four - Lumbar four-five;  Surgeon: Eustace Moore, MD;  Location: [place];  Service: Neurosurgery;  Laterality: N/A;  . RADIOLOGY WITH ANESTHESIA Right 11/01/2014   Procedure: MRI RIGHT FOREARM;  Surgeon: Medication Radiologist, MD;  Location: [place];  Service: Radiology;  Laterality: Right;  . RADIOLOGY WITH ANESTHESIA Right 08/29/2015   Procedure: MRI - RIGHT FOREARM;  Surgeon: Medication Radiologist, MD;  Location: [place];  Service: Radiology;  Laterality: Right;  . RADIOLOGY WITH ANESTHESIA N/A  12/05/2015   Procedure: MRI SPINE WITHOUT;  Surgeon: Medication Radiologist, MD;  Location: [place];  Service: Radiology;  Laterality: N/A;  . SKIN CANCER EXCISION     BILAT SHOULDERS  . SPLIT NIGHT STUDY  09/04/2015  . TONSILLECTOMY AND ADENOIDECTOMY      There were no vitals filed for this visit.  Subjective Assessment - 02/27/19 1352    Subjective  Pt. just came from appointment with her pulmonary MD and reports her lung function has improved with recent medication. No pain pre-tx. Discussed overall status and pt. reports that she feels able to continue progress independently with HEP at this point.    Pertinent History  diabetic, asthma, COPD, chronic LBP, knee OA, knee sx., heel sx.    Currently in Pain?  No/denies         [facility] PT Assessment - 02/27/19  0001      Observation/Other Assessments   Focus on Therapeutic Outcomes (FOTO)   33% limited      Sit to Stand   Comments  5x sit<>stand 11.8 sec      Other:   Other/ Comments  TUG 10 sec      AROM   Right Shoulder Flexion  150 Degrees    Right Shoulder ABduction  140 Degrees    Right Shoulder Internal Rotation  --   reach to T8   Right Shoulder External Rotation  --   reach to T1   Left Shoulder Flexion  150 Degrees    Left Shoulder ABduction  130 Degrees    Left Shoulder Internal Rotation  --   reach to T9   Left Shoulder External Rotation  --   reach to T1     Strength   Right Shoulder Flexion  5/5    Right Shoulder ABduction  5/5    Right Shoulder Internal Rotation  5/5    Right Shoulder External Rotation  5/5    Left Shoulder Flexion  5/5    Left Shoulder ABduction  5/5    Left Shoulder Internal Rotation  5/5    Left Shoulder External Rotation  5/5    Right Knee Flexion  5/5    Right Knee Extension  5/5    Left Knee Flexion  5/5    Left Knee Extension  5/5                   [facility] Adult PT Treatment/Exercise - 02/27/19 0001      Knee/Hip Exercises: Standing   Heel Raises  Both;20 reps    Hip Abduction  Right;Left;10 reps    Forward Step Up  Right;Left;1 set;10 reps;Hand Hold: 2;Step Height: 4"    Functional Squat Limitations  squat at counter to chair touch 2x10, UE support at sink      Shoulder Exercises: Standing   External Rotation  AROM;Strengthening;Both;20 reps    Theraband Level (Shoulder External Rotation)  Level 2 (Red)    Internal Rotation  AROM;Strengthening;Right;Left;20 reps    Theraband Level (Shoulder Internal Rotation)  Level 3 (Green)    Flexion Limitations  10 ea. bilat. to 90 deg flexion full can position    ABduction Limitations  scaption with 1 lb. DB ea. UE x 10 reps bilat.    Extension  AROM;Strengthening;Both;20 reps    Theraband Level (Shoulder Extension)  Level 3 (Green)    Row  AROM;Strengthening;Both;20 reps     Theraband Level (Shoulder Row)  Level 4 (Blue)    Row Limitations  cues  for angle of elbow flexion             PT Education - 02/27/19 1354    Education Details  HEP, POC    Person(s) Educated  Patient    Methods  Explanation;Demonstration;Verbal cues;Handout    Comprehension  Verbalized understanding;Returned demonstration       PT Short Term Goals - 12/09/18 0714      PT SHORT TERM GOAL #1   Title  STG =LTG        PT Long Term Goals - 02/27/19 1338      PT LONG TERM GOAL #1   Title  Patient will decrease TUG time to 14 sec or less to decrease fall risk    Baseline  10 sec    Time  6    Period  Weeks    Status  Achieved      PT LONG TERM GOAL #2   Title  Patient will increase bilat. active left shoulder flexion to 135 degrees in order to perfrom IADL's    Baseline  see objective    Time  6    Period  Weeks    Status  Achieved      PT LONG TERM GOAL #3   Title  Patient will perfrom sit to stand test in 10 seconds in order to demonstrate improved functional mobility    Baseline  11.8 seconds    Time  6    Period  Weeks    Status  Not Met      PT LONG TERM GOAL #4   Title  Patient will be independent with UE/LE strength and conditioning program    Baseline  met, instructed/reviewed today    Time  6    Period  Weeks    Status  Achieved      PT LONG TERM GOAL #5   Title  Increase UE/LE strength grossly 1/2 MMT grade from current status to improve ability for lifting for chores/carrying groceries, improved ability sit>stand from low chairs and improved safety with gait    Baseline  met    Time  6    Period  Weeks    Status  Achieved            Plan - 02/27/19 1355    Clinical Impression Statement  Pt. has progressed well with therapy and excepting goal for 10 sec for 5x sit<>stand (albe today in 11.8 sec) all therapy goals have otherwise been met. Updated and reviewed HEP and expect that at this point she can continue her progress independently. No  further formal therapy needed/planned at this time.    Personal Factors and Comorbidities  Comorbidity 1;Comorbidity 2;Comorbidity 3+;Behavior Pattern    Comorbidities  obesity, dyspnea with exertion, multi joint OA    Examination-Activity Limitations  Squat;Stairs;Stand;Locomotion Level    Examination-Participation Restrictions  Proofreader;Yard Work    Stability/Clinical Decision Making  Evolving/Moderate complexity    Clinical Decision Making  Moderate    Rehab Potential  Good    PT Frequency  --   1-2x/week   PT Duration  6 weeks    PT Treatment/Interventions  ADLs/Self Care Home Management;Electrical Stimulation;Cryotherapy;Iontophoresis 32m/ml Dexamethasone;Moist Heat;Ultrasound;DME Instruction;Gait training;Functional mobility training;Stair training;Therapeutic activities;Therapeutic exercise;Neuromuscular re-education;Manual techniques;Passive range of motion;Dry needling;Taping;Patient/family education    PT Next Visit Plan  NA-d/c to HEP    PT Home Exercise Plan  see chart copy of HEP    Consulted and Agree with Plan of Care  Patient  Patient will benefit from skilled therapeutic intervention in order to improve the following deficits and impairments:  Abnormal gait, Decreased mobility, Difficulty walking, Decreased activity tolerance, Decreased strength, Impaired UE functional use, Postural dysfunction, Pain, Decreased range of motion  Visit Diagnosis: Chronic left shoulder pain  Chronic right shoulder pain  Muscle weakness (generalized)  Other abnormalities of gait and mobility     Problem List Patient Active Problem List   Diagnosis Date Noted  . Complex care coordination 02/27/2019  . Healthcare maintenance 02/09/2019  . Medication management 12/01/2018  . Shortness of breath 12/01/2018  . Former smoker 12/01/2018  . Atrial fibrillation, rapid ([place]) 04/29/2018  . COPD exacerbation ([place]) 04/29/2018  . Acute gastroenteritis  03/23/2018  . GERD (gastroesophageal reflux disease) 01/03/2018  . Proteinuria 09/15/2017  . Insomnia 03/17/2017  . Rash 11/04/2016  . Hematoma 11/04/2016  . Pre-operative examination for internal medicine 09/11/2016  . S/P lumbar laminectomy 03/19/2016  . Overactive bladder 10/20/2015  . Pulmonary HTN ([place]) 06/19/2015  . Mild obstructive sleep apnea 06/19/2015  . Snoring 06/19/2015  . Abdominal pain 04/18/2015  . Cough 03/08/2015  . A-fib ([place]) 02/05/2014  . Preop cardiovascular exam 02/05/2014  . Arthritis of knee 02/05/2014  . Arm pain, right 08/31/2013  . Low back pain 09/26/2011  . Leg pain 07/07/2011  . Dizziness 07/07/2011  . Menopausal disorder 07/07/2011  . Hemorrhage of rectum and anus 11/04/2010  . Internal thrombosed hemorrhoids 11/04/2010  . Internal hemorrhoids with other complication 95/63/8756  . External hemorrhoids without mention of complication 43/32/9518  . Dyspepsia 09/24/2010  . Peripheral edema 09/24/2010  . Diabetes ([place]) 09/21/2010  . Encounter for preventative adult health care exam with abnormal findings 09/21/2010  . IBS 06/20/2009  . HEMATOCHEZIA 06/20/2009  . Elevated lipids 08/16/2008  . Anxiety state 08/16/2008  . Depression 08/16/2008  . Allergic rhinitis 08/16/2008  . ASTHMA 08/16/2008  . COPD (chronic obstructive pulmonary disease) ([place]) 08/16/2008  . GENITAL HERPES 12/03/2006  . Hypothyroidism 12/03/2006  . Essential hypertension 12/03/2006       PHYSICAL THERAPY DISCHARGE SUMMARY  Visits from Start of Care: 6  Current functional level related to goals / functional outcomes: See above. Therapy goals met excepting time goal for 5 times sit<>stand test. Patient still having some fatigue issues with COPD but expect at this point can continue her progress with independent exercise.  Remaining deficits: Muscle weakness, fatigue with underlying COPD   Education / Equipment: HEP, issued red Theraband  Plan: Patient agrees to  discharge.  Patient goals were met. Patient is being discharged due to meeting the stated rehab goals.  ?????            Beaulah Dinning, PT, DPT 02/27/19 1:58 PM  [facility] [address] [place], [place], [address] Phone: [phone]   Fax:  [phone]  Name: MARILENA TREVATHAN MRN: [id number] Date of Birth: [birth year]

## 2019-02-27 NOTE — Telephone Encounter (Signed)
Called patient about her message. Informed patient of number to call for assistance for eliquis. Informed patient that we would check for samples.  Called patient back. Informed patient that we will leave her some samples with the front desk, and that she could come by and pick up today.   Will send message to Dr. Johnsie Cancel, so he is aware of situation and medication changes by pulmonary.

## 2019-02-27 NOTE — Assessment & Plan Note (Signed)
Patient struggles with medication affordability as well as access  Plan: [facility]

## 2019-02-27 NOTE — Assessment & Plan Note (Signed)
Plan: Continue to monitor clinically May need to consider repeat home sleep study at some point if patient starts to become symptomatic 

## 2019-02-27 NOTE — Patient Instructions (Addendum)
You were seen today by Lauraine Rinne, NP  for:   1. Other emphysema ([place])  Continue Breztri >>> 2 puffs in the morning right when you wake up, rinse out your mouth after use, 12 hours later 2 puffs, rinse after use >>> Take this daily, no matter what >>> This is not a rescue inhaler   Only use your albuterol as a rescue medication to be used if you can't catch your breath by resting or doing a relaxed purse lip breathing pattern.  - The less you use it, the better it will work when you need it. - Ok to use up to 2 puffs  every 4 hours if you must but call for immediate appointment if use goes up over your usual need - Don't leave home without it !!  (think of it like the spare tire for your car)    Note your daily symptoms > remember "red flags" for COPD:   >>>Increase in cough >>>increase in sputum production >>>increase in shortness of breath or activity  intolerance.   If you notice these symptoms, please call the office to be seen.    2. Persistent atrial fibrillation (H[facility])  Schedule follow-up with cardiology as soon as possible  Let them know that you are off of Eliquis   3. Allergic rhinitis due to pollen, unspecified seasonality  4. Mild obstructive sleep apnea  We will continue to monitor this  5. Healthcare maintenance  Please return telephone calls when you were contacted as a way to help manage your health.  6. Medication management  Work with Valente David number listed below and tried healthcare network to help with medication access  Please contact cardiology and let them know that you no longer can afford Eliquis  7. Former smoker  Continue to not smoke  8. Complex care coordination  Please contact:  Valente David, RN, MSN [place] [phone]  Please contact cardiology Dr. Johnsie Cancel [phone]  9. Shortness of Breath    Increase Lasix to 40 mg twice daily for the next 5 days, then resume 40 mg of Lasix daily   Follow Up:    Return in about 2 weeks (around 03/13/2019), or if symptoms worsen or fail to improve, for Follow up with Wyn Quaker FNP-C.   Please do your part to reduce the spread of COVID-19:      Reduce your risk of any infection  and COVID19 by using the similar precautions used for avoiding the common cold or flu:  Marland Kitchen Wash your hands often with soap and warm water for at least 20 seconds.  If soap and water are not readily available, use an alcohol-based hand sanitizer with at least 60% alcohol.  . If coughing or sneezing, cover your mouth and nose by coughing or sneezing into the elbow areas of your shirt or coat, into a tissue or into your sleeve (not your hands). Langley Gauss A MASK when in public  . Avoid shaking hands with others and consider head nods or verbal greetings only. . Avoid touching your eyes, nose, or mouth with unwashed hands.  . Avoid close contact with people who are sick. . Avoid places or events with large numbers of people in one location, like concerts or sporting events. . If you have some symptoms but not all symptoms, continue to monitor at home and seek medical attention if your symptoms worsen. . If you are having a medical emergency, call 911.  [place] / e-Visit: [email or web address]         [facility]: [facility]: [id number]                   [facility]: [id number]     It is flu season:   >>> Best ways to protect herself from the flu: Receive the yearly flu vaccine, practice good hand hygiene washing with soap and also using hand sanitizer when available, eat a nutritious meals, get adequate rest, hydrate appropriately   Please contact the office if your symptoms worsen or you have concerns that you are not improving.   Thank you for choosing [facility] for your healthcare,  and for allowing [place] to partner with you on your healthcare journey. I am thankful to be able to provide care to you today.   Wyn Quaker FNP-C

## 2019-02-27 NOTE — Assessment & Plan Note (Signed)
Plan: Continue Zyrtec 

## 2019-02-27 NOTE — Telephone Encounter (Signed)
02/27/2019 [facility] Dr. Johnsie Cancel to update their office regarding the patient's current status, recently Lasix increased to 40 mg twice daily for the next 5 days based off of lab work results, as well as the fact the patient has been off of Eliquis for least the last 2 months due to high cost of Eliquis.  I spoke with Delsa Sale.  She documented the note to route to Dr. Johnsie Cancel.  Patient is scheduled to see them in office next week.  Wyn Quaker, FNP

## 2019-02-27 NOTE — Assessment & Plan Note (Signed)
I believe this is likely multifactorial as patient has restrictive lung disease, obstructive lung disease, morbid obesity, physically sedentary, likely diastolic dysfunction, pulmonary hypertension.  These are all likely contributing to her shortness of breath.  Her shortness of breath was improved on maintenance inhalers.  Plan: Continue Breztri inhaler Increase Lasix to 40 mg twice daily for 5 days, continue Lasix 40 mg daily Start weighing yourself regularly 2-week follow-up with our office

## 2019-02-27 NOTE — Telephone Encounter (Signed)
Follow-Up:  Patient called and reported that she has not been taking her Eliquis for the past two months due to financial reasons. If there are any programs that the office could help her enroll in she would appreciate it.

## 2019-02-27 NOTE — Assessment & Plan Note (Signed)
Pulmonary function testing reviewed today showing strong asthmatic component Patient reports symptomatic improvement when on maintenance inhalers Blood work showing peripheral eosinophilia  Plan: Continue Breztri Continue DuoNeb nebulized medication as needed for shortness of breath or wheezing Emphasized need for the patient to contact [facility] clinical team back, number provided today Close follow-up in office in 2 weeks

## 2019-02-27 NOTE — Assessment & Plan Note (Signed)
Plan: Lasix 40 mg twice daily for next 5 days, then resume Lasix 40 mg daily Keep scheduled follow-up with cardiology Continue to weigh yourself in the morning Continue to follow low-sodium diet

## 2019-02-27 NOTE — Assessment & Plan Note (Signed)
Plan: I contacted cardiology to discuss the fact the patient is currently off of Eliquis  I emphasized the need for the patient to contact cardiology as well as to contact their [facility] registered nurse who has been trying to reach her

## 2019-02-28 ENCOUNTER — Other Ambulatory Visit: Payer: Self-pay | Admitting: *Deleted

## 2019-02-28 NOTE — Patient Outreach (Addendum)
[facility]) Care Management  02/28/2019  Andrea Perry 06/13/1950 FY:[id number]   Referral Date:02/09/2019 Referral Source:MD office (pulmonology) Referral Reason:COPD, medication costs / access Insurance:BCBS  Outreach attempt #4, unsuccessful.  Voice message received back from member after multiple missed calls.  Call placed to member for screening and assessment of needs, no answer.  HIPAA compliant voice message left.  Will close case due to inability to establish contact.   Update @ 1230:  Call received back from member, identity verified.  This care manager introduced self and stated purpose of call.  [facility] care management services explained.  Member is independent in her care, usually lives alone but report her niece is currently living with her but will be moving soon.  State she is able to drive and provide own transportation to MD appointments.  Feel she is managing her health care well but is open to follow up communication and education.  State her only concern at this time is being able to pay for her medications.  Advised that [facility] pharmacist will make contact with her regarding assistance, advised to answer phone and/or call back if unable to answer.  She verbalizes understanding.    Will place referral to health coach for ongoing disease management and pharmacist for medication assistance.   Valente David, [place], MSN [place] [phone]

## 2019-02-28 NOTE — Addendum Note (Signed)
Addended byValente David on: 02/28/2019 01:06 PM   Modules accepted: Orders

## 2019-03-01 ENCOUNTER — Telehealth: Payer: Self-pay | Admitting: Pharmacist

## 2019-03-01 NOTE — Progress Notes (Signed)
Date:  03/07/2019   ID:  AZRIELA FIELDEN, DOB 07-04-1950, MRN [id number]  Provider Location: Office  PCP:  Biagio Borg, MD  Cardiologist:   Johnsie Cancel Electrophysiologist:  None   Evaluation Performed:  Follow-Up Visit  Chief Complaint:  PAF  History of Present Illness:    68 y.o. with history of diastolic dysfunction, DM, HTN, HLD. Started on Glucophage by Dr Jenny Reichmann in June A1c was elevated over 8.1  She has had Long standing PAF Tends to retain fluid when she is in afib. Was supposed to have [facility] in June 2019 but indicated she could not afford eliquis and cancelled procedure No history of CAD last normal myovue was 2016  Echo 10/05/17 EF normal LA 42 mm  Had been of anticoagulation again due to cost Lasix increased by primary 02/27/19 as patient had elevated BNP 1611 K 4.4 Cr 0.87   She is unaware of PAF In office today more volume overloaded and in afib Discussed increasing AAT/Flecainide and doing monitor If still in afib all time will need [facility] in 3-4 weeks   The patient  does not have symptoms concerning for COVID-19 infection (fever, chills, cough, or new shortness of breath).    Past Medical History:  Diagnosis Date  . ALLERGIC RHINITIS 08/16/2008  . ANXIETY 08/16/2008  . Arthritis    hands, knees, lower back  . ASTHMA 08/16/2008  . Asthma    BRONCHITIS     DR. Lamonte Sakai    . Bladder leak   . Cancer ([place])    MELANOMA      . Chronic lower back pain    problems with disc L2-5  . COPD 08/16/2008  . DEPRESSION 08/16/2008  . Diabetes mellitus without complication ([place])   . Dysrhythmia    hx AF  . Excessive daytime sleepiness 06/19/2015  . GENITAL HERPES 12/03/2006  . GERD (gastroesophageal reflux disease)   . H/O hiatal hernia   . HEMATOCHEZIA 06/20/2009  . Hemorrhoids   . History of cardioversion 10/30/14  . History of kidney stones   . HYPERLIPIDEMIA 08/16/2008  . HYPERTENSION 12/03/2006  . Hypertension   . Impaired glucose tolerance 09/21/2010  . Overactive bladder 10/20/2015  .  Pneumonia    as a child  . Pulmonary HTN ([place]) 06/19/2015  . Shortness of breath dyspnea   . Sleep apnea    MILD NO CPAP ORDERED  . Snoring 06/19/2015   Past Surgical History:  Procedure Laterality Date  . ABDOMINAL HYSTERECTOMY    . APPENDECTOMY    . CARDIOVERSION N/A 03/20/2014   Procedure: CARDIOVERSION;  Surgeon: Josue Hector, MD;  Location[facility] ENDOSCOPY;  Service: Cardiovascular;  Laterality: N/A;  . CARDIOVERSION N/A 10/30/2014   Procedure: CARDIOVERSION;  Surgeon: Josue Hector, MD;  Location: [facility] ENDOSCOPY;  Service: Cardiovascular;  Laterality: N/A;  . CARPAL TUNNEL RELEASE     Right + LEFT  . CESAREAN SECTION     x 1   . COLONOSCOPY  2004  . CYST EXCISION     RT ARM   . CYSTOCELE REPAIR N/A 10/25/2012   Procedure: ANTERIOR REPAIR (CYSTOCELE);  Surgeon: Gus Height, MD;  Location: [place] [facility];  Service: Gynecology;  Laterality: N/A;  . HEEL SPUR SURGERY Bilateral   . KNEE SURGERY     Right + LEFT  . LUMBAR LAMINECTOMY/DECOMPRESSION MICRODISCECTOMY N/A 03/19/2016   Procedure: Laminectomy and Foraminotomy - Lumbar three-four - Lumbar four-five;  Surgeon: Eustace Moore, MD;  Location: [place];  Service: Neurosurgery;  Laterality: N/A;  . RADIOLOGY WITH ANESTHESIA Right 11/01/2014   Procedure: MRI RIGHT FOREARM;  Surgeon: Medication Radiologist, MD;  Location: [place];  Service: Radiology;  Laterality: Right;  . RADIOLOGY WITH ANESTHESIA Right 08/29/2015   Procedure: MRI - RIGHT FOREARM;  Surgeon: Medication Radiologist, MD;  Location: [place];  Service: Radiology;  Laterality: Right;  . RADIOLOGY WITH ANESTHESIA N/A 12/05/2015   Procedure: MRI SPINE WITHOUT;  Surgeon: Medication Radiologist, MD;  Location: [place];  Service: Radiology;  Laterality: N/A;  . SKIN CANCER EXCISION     BILAT SHOULDERS  . SPLIT NIGHT STUDY  09/04/2015  . TONSILLECTOMY AND ADENOIDECTOMY       Current Meds  Medication Sig  . albuterol (PROVENTIL HFA;VENTOLIN HFA) 108 (90 Base) MCG/ACT inhaler Inhale 2 puffs by  mouth every 6 hours as needed for wheezing or shortness of breath. **Yearly physical due in June**  . amLODipine (NORVASC) 10 MG tablet Take 1 tablet by mouth once daily  . Blood Glucose Monitoring Suppl (FREESTYLE LITE) DEVI Use as directed daily E11.9  . Budeson-Glycopyrrol-Formoterol (BREZTRI AEROSPHERE) 160-9-4.8 MCG/ACT AERO Inhale 2 puffs into the lungs 2 (two) times daily.  . cetirizine (ZYRTEC) 10 MG tablet Take 10 mg by mouth daily.   . Cholecalciferol (VITAMIN D) 2000 units tablet Take 2,000 Units by mouth daily.  . Cyanocobalamin (VITAMELTS ENERGY VITAMIN B-12) 1500 MCG TBDP Take 1,500 mcg by mouth daily.  Marland Kitchen dimenhyDRINATE (DRAMAMINE) 50 MG tablet Take 50 mg by mouth daily as needed for nausea.  . diphenoxylate-atropine (LOMOTIL) 2.5-0.025 MG tablet Take 1 tablet by mouth 4 (four) times daily as needed for diarrhea or loose stools.  Marland Kitchen ELIQUIS 5 MG TABS tablet TAKE 1 TABLET BY MOUTH TWICE DAILY  . escitalopram (LEXAPRO) 20 MG tablet Take 1 tablet (20 mg total) by mouth daily.  Marland Kitchen estradiol (ESTRACE) 1 MG tablet Take 1 mg by mouth daily.  . flecainide (TAMBOCOR) 50 MG tablet Take 1.5 tablets (75 mg total) by mouth 2 (two) times daily.  . fluticasone (FLONASE) 50 MCG/ACT nasal spray Place 2 sprays into both nostrils daily.  . furosemide (LASIX) 40 MG tablet Take 1 tablet by mouth once daily  . glucose blood (FREESTYLE LITE) test strip Use as instructed once daily E11.9  . guaiFENesin (MUCINEX) 600 MG 12 hr tablet Take 1 tablet (600 mg total) by mouth 2 (two) times daily as needed for cough or to loosen phlegm.  Marland Kitchen ipratropium-albuterol (DUONEB) 0.5-2.5 (3) MG/3ML SOLN USE 1 AMPULE IN NEBULIZER 4 TIMES DAILY  . irbesartan (AVAPRO) 150 MG tablet Take 1 tablet by mouth once daily  . Lancets MISC Use as directed once daily E11.9  . levothyroxine (SYNTHROID) 25 MCG tablet Take 1 tablet (25 mcg total) by mouth daily before breakfast.  . LORazepam (ATIVAN) 1 MG tablet Take 1 tablet by mouth twice  per day as needed for anxiety.  . metFORMIN (GLUCOPHAGE) 500 MG tablet Take 1 tablet (500 mg total) by mouth daily with breakfast.  . metoprolol tartrate (LOPRESSOR) 50 MG tablet TAKE 1.5 TABLET BY MOUTH 2 TIMES A DAY  . nystatin (MYCOSTATIN/NYSTOP) powder Use as directed twice daily as needed  . Omega-3 Fatty Acids (FISH OIL) 1200 MG CAPS Take 1,200 mg by mouth daily.   . ondansetron (ZOFRAN) 4 MG tablet Take 1 tablet (4 mg total) by mouth every 8 (eight) hours as needed for nausea or vomiting.  . pantoprazole (PROTONIX) 40 MG tablet Take 1 tablet by mouth once daily  .  potassium chloride SA (K-DUR,KLOR-CON) 20 MEQ tablet Take 1 tablet (20 mEq total) by mouth daily.  . pravastatin (PRAVACHOL) 40 MG tablet Take 1 tablet by mouth once daily  . Tetrahydrozoline HCl (VISINE OP) Place 1 drop into both eyes daily as needed (dry eyes).  Marland Kitchen tiZANidine (ZANAFLEX) 2 MG tablet Take 1 tablet (2 mg total) by mouth every 6 (six) hours as needed for muscle spasms.  Marland Kitchen tolterodine (DETROL LA) 4 MG 24 hr capsule Take 4 mg by mouth daily.  . vitamin E 400 UNIT capsule Take 400 Units by mouth daily.  . [DISCONTINUED] flecainide (TAMBOCOR) 50 MG tablet TAKE 1 TABLET BY MOUTH TWICE DAILY PLEASE  KEEP  UPCOMING  APPOINTMENT  FOR  FURTHER  REFILLS  THANK  YOU     Allergies:   Tramadol hcl, Codeine, Tape, and Vicodin [hydrocodone-acetaminophen]   Social History   Tobacco Use  . Smoking status: Former Smoker    Packs/day: 1.50    Years: 30.00    Pack years: 45.00    Types: Cigarettes    Quit date: 04/21/2003    Years since quitting: 15.8  . Smokeless tobacco: Never Used  Substance Use Topics  . Alcohol use: Yes    Comment: OCCAS  . Drug use: No     Family Hx: The patient's family history includes Colon polyps in her mother; Diabetes in her mother; Hyperlipidemia in her mother; Hypertension in her mother; Stroke in her father. There is no history of Colon cancer.  ROS:   Please see the history of present  illness.     All other systems reviewed and are negative.   Prior CV studies:   The following studies were reviewed today:  Echo 10/05/17 Myovue 2016  Labs/Other Tests and Data Reviewed:    EKG:  03/07/19 afib rate 101 nonspecific ST changes   Recent Labs: 03/23/2018: TSH 7.12 02/09/2019: ALT 13; BUN 15; Creatinine, Ser 0.87; Hemoglobin 13.2; NT-Pro BNP 1,611; Platelets 269.0; Potassium 4.4; Sodium 143   Recent Lipid Panel Lab Results  Component Value Date/Time   CHOL 163 03/23/2018 03:21 PM   TRIG 168.0 (H) 03/23/2018 03:21 PM   HDL 41.90 03/23/2018 03:21 PM   CHOLHDL 4 03/23/2018 03:21 PM   LDLCALC 87 03/23/2018 03:21 PM   LDLDIRECT 141.0 10/16/2015 02:37 PM    Wt Readings from Last 3 Encounters:  03/07/19 266 lb (120.7 kg)  02/27/19 270 lb (122.5 kg)  02/09/19 268 lb (121.6 kg)     Objective:    Vital Signs:  BP 122/66   Pulse (!) 101   Ht 5\' 2"  (1.575 m)   Wt 266 lb (120.7 kg)   SpO2 95%   BMI 48.65 kg/m    Affect appropriate Overweight white female  HEENT: normal Neck supple with no adenopathy JVP normal no bruits no thyromegaly Lungs clear with no wheezing and good diaphragmatic motion Heart:  S1/S2 no murmur, no rub, gallop or click PMI normal Abdomen: benighn, BS positve, no tenderness, no AAA no bruit.  No HSM or HJR Distal pulses intact with no bruits Plus 2 pedal edema Neuro non-focal Skin warm and dry No muscular weakness   ASSESSMENT & PLAN:    PAF:  She has missed some eliquis doses but has been on steady for last week and a half Increase flecainide to 75 mg bid Intervals ok on ECG fu Afib clinic for ECG will give her 14 day monitor and if constant afib will need to arrange [facility] in  3-4 weeks as it makes her diastolic function and fluid overload worse HTN:  Well controlled.  Continue current medications and low sodium Dash type diet.   Thyroid:  Started on low dose synthroid f/u TSH Dr Jenny Reichmann HLD:  On statin labs with pirmary  DM:   Discussed low carb diet.  Target hemoglobin A1c is 6.5 or less.  Continue current medications. Diastolic Better with higher dose lasix exacerbated by recent afib see above    COVID-19 Education: The signs and symptoms of COVID-19 were discussed with the patient and how to seek care for testing (follow up with PCP or arrange E-visit).  The importance of social distancing was discussed today.  Time:   Today, I have spent 30 minutes with the patient    Medication Adjustments/Labs and Tests Ordered:  Flecainide 75 bid   Tests Ordered:   ECG   None   Disposition:  Follow up in 3 weeks with afib clinic possible arrange [facility]  Signed, Jenkins Rouge, MD  03/07/2019 9:45 AM    [place]

## 2019-03-01 NOTE — Patient Outreach (Signed)
[facility]) Care Management  [place]   03/01/2019  Andrea Perry Jul 11, 1950 [id number]  Reason for referral: patient assistance  Referral source: [facility] Nurse Referral medication(s): [name] and Eliquis Current insurance:Blue Cross Blue Shield  PMHx: Allergic rhinitis, A Fib, asthma, COPD, Depression, Dizziness, hyperlipidemia, hypertension, GERD, hypothyroidism Pulmonary hypertension, and type 2 diabetes.  HPI: Patient is a 68 year old female with the above medical conditions. She reported she was just started on Breztri with samples but that she anticipates not being able to afford it. She also reported having an issue with the copay of Eliquis after hitting the donut hole.  Objective: Allergies  Allergen Reactions  . Tramadol Hcl Nausea And Vomiting and Other (See Comments)     mouth dryness, headache  . Codeine Nausea And Vomiting and Nausea Only  . Tape Rash    Plastic tape, bandaids and ekg leads, causes redness and rash  . Vicodin [Hydrocodone-Acetaminophen] Nausea And Vomiting    Medications Reviewed Today    Reviewed by Elayne Guerin, [facility] (Pharmacist) on 03/01/19 at 1534  Med List Status: <None>  Medication Order Taking? Sig Documenting Provider Last Dose Status Informant  albuterol (PROVENTIL HFA;VENTOLIN HFA) 108 (90 Base) MCG/ACT inhaler [id number] No Inhale 2 puffs by mouth every 6 hours as needed for wheezing or shortness of breath. **Yearly physical due in June** Byrum, Rose Fillers, MD Unknown Active            Med Note Lovena Le, Garvin Fila   Fri Feb 24, 2019 10:50 AM) Uses about 2x per day  amLODipine (NORVASC) 10 MG tablet [id number] Yes Take 1 tablet by mouth once daily Josue Hector, MD Taking Active   Blood Glucose Monitoring Suppl ([address] [place]) Kerrin Mo [id number] Yes Use as directed daily E11.9 Biagio Borg, MD Taking Active   Budeson-Glycopyrrol-Formoterol (BREZTRI AEROSPHERE) 160-9-4.8 MCG/ACT Hollie Salk [id number] Yes Inhale 2 puffs into the  lungs 2 (two) times daily. Lauraine Rinne, NP Taking Active   cetirizine (ZYRTEC) 10 MG tablet [id number] Yes Take 10 mg by mouth daily.  [provider] Taking Active Self  Cholecalciferol (VITAMIN D) 2000 units tablet [id number] Yes Take 2,000 Units by mouth daily. [provider] Taking Active Self  Cyanocobalamin (VITAMELTS ENERGY VITAMIN B-12) 1500 MCG TBDP [id number] Yes Take 1,500 mcg by mouth daily. [provider] Taking Active Self  dimenhyDRINATE (DRAMAMINE) 50 MG tablet [id number]  Take 50 mg by mouth daily as needed for nausea. [provider]  Active Self           Med Note Ellwood Handler   Fri Feb 24, 2019 10:52 AM) Does not use frequently  diphenoxylate-atropine (LOMOTIL) 2.5-0.025 MG tablet [id number] Yes Take 1 tablet by mouth 4 (four) times daily as needed for diarrhea or loose stools. Biagio Borg, MD Taking Active            Med Note Arnette Schaumann Mar 01, 2019  3:26 PM) PRN only   ELIQUIS 5 MG TABS tablet [id number] Yes TAKE 1 TABLET BY MOUTH TWICE DAILY Josue Hector, MD Taking Active            Med Note Arnette Schaumann Mar 01, 2019  3:27 PM)    escitalopram (LEXAPRO) 20 MG tablet [id number] Yes Take 1 tablet (20 mg total) by mouth daily. Biagio Borg, MD Taking Active   estradiol (ESTRACE) 1 MG tablet [id number] Yes Take 1 mg by  mouth daily. [provider] Taking Active   flecainide (TAMBOCOR) 50 MG tablet [id number] Yes TAKE 1 TABLET BY MOUTH TWICE DAILY PLEASE  KEEP  UPCOMING  APPOINTMENT  FOR  FURTHER  REFILLS  THANK  YOU Josue Hector, MD Taking Active   fluticasone (FLONASE) 50 MCG/ACT nasal spray [id number] Yes Place 2 sprays into both nostrils daily. Collene Gobble, MD Taking Active            Med Note Ellwood Handler   Fri Feb 24, 2019 10:54 AM) Uses ~2x per week  furosemide (LASIX) 40 MG tablet [id number] Yes Take 1 tablet by mouth once daily Biagio Borg, MD Taking Active   glucose blood (FREESTYLE LITE) test  strip [id number] No Use as instructed once daily E11.9  Patient not taking: Reported on 03/01/2019   Biagio Borg, MD Not Taking Active   guaiFENesin (MUCINEX) 600 MG 12 hr tablet [id number] Yes Take 1 tablet (600 mg total) by mouth 2 (two) times daily as needed for cough or to loosen phlegm. Biagio Borg, MD Taking Active            Med Note Lovena Le, [place] K   Fri Feb 24, 2019 10:56 AM) Does not take frequently  ipratropium-albuterol (DUONEB) 0.5-2.5 (3) MG/3ML Bailey Mech [id number] Yes USE 3 ML IN NEBULIZER  4 TIMES DAILY Collene Gobble, MD Taking Active            Med Note Ellwood Handler   Fri Feb 24, 2019 10:56 AM) Dewaine Conger 2x per day  irbesartan (AVAPRO) 150 MG tablet [id number] Yes Take 1 tablet by mouth once daily Biagio Borg, MD Taking Active   Lancets MISC [id number] Yes Use as directed once daily E11.9 Biagio Borg, MD Taking Active   levothyroxine (SYNTHROID) 25 MCG tablet [id number] Yes Take 1 tablet (25 mcg total) by mouth daily before breakfast. Biagio Borg, MD Taking Active   LORazepam (ATIVAN) 1 MG tablet [id number] Yes Take 1 tablet by mouth twice per day as needed for anxiety. Biagio Borg, MD Taking Active            Med Note Lovena Le, [place] K   Fri Feb 24, 2019 10:56 AM) Uses when she goes to bed at time  metFORMIN (GLUCOPHAGE) 500 MG tablet [id number] Yes Take 1 tablet (500 mg total) by mouth daily with breakfast. Biagio Borg, MD Taking Active   metoprolol tartrate (LOPRESSOR) 50 MG tablet [id number] Yes TAKE 1.5 TABLET BY MOUTH 2 TIMES A DAY Josue Hector, MD Taking Active   nystatin (MYCOSTATIN/NYSTOP) powder [id number] Yes Use as directed twice daily as needed Biagio Borg, MD Taking Active   Omega-3 Fatty Acids (FISH OIL) 1200 MG CAPS [id number] Yes Take 1,200 mg by mouth daily.  [provider] Taking Active Self  ondansetron (ZOFRAN) 4 MG tablet [id number] Yes Take 1 tablet (4 mg total) by mouth every 8 (eight) hours as needed for nausea or vomiting. Biagio Borg, MD Taking  Active   pantoprazole (PROTONIX) 40 MG tablet [id number] Yes Take 1 tablet by mouth once daily Biagio Borg, MD Taking Active   potassium chloride SA (K-DUR,KLOR-CON) 20 MEQ tablet [id number] Yes Take 1 tablet (20 mEq total) by mouth daily. Biagio Borg, MD Taking Active   pravastatin (PRAVACHOL) 40 MG tablet [id number] Yes Take 1 tablet by mouth once daily Biagio Borg, MD Taking Active   Tetrahydrozoline HCl [facility] OP) [id number] Yes Place 1  drop into both eyes daily as needed (dry eyes). [provider] Taking Active Self           Med Note Ellwood Handler   Fri Feb 24, 2019 10:58 AM) Uses about 3x per day  tiZANidine (ZANAFLEX) 2 MG tablet [id number] Yes Take 1 tablet (2 mg total) by mouth every 6 (six) hours as needed for muscle spasms. Maudie Flakes, MD Taking Active   tolterodine (DETROL LA) 4 MG 24 hr capsule [id number] Yes Take 4 mg by mouth daily. [provider] Taking Active   vitamin E 400 UNIT capsule [id number] Yes Take 400 Units by mouth daily. [provider] Taking Active Self       Patient not taking:      Discontinued 03/01/19 1534 (Completed Course)           Assessment:  Drugs sorted by system:  Neurologic/Psychologic: Escitalopram, Lorazepam,  Cardiovascular: Amlodipine, Eliquis, flecainide, furosemide, pravastatin  Pulmonary/Allergy: Albuterol-ipratropium nebulizer solution, Breztri, Cetirizine, fluticasone, irbesartan, guaifenesin, metoprolol tartrate,  Gastrointestinal: Diphenoxylate, Ondansetron, Pantoprazole  Endocrine: Levothyroxine, Metformin  Topical: Nystatin Powder  Genitourinary: Estradiol, Tolterodine  Vitamins/Minerals/Supplements: Cholecalciferol, Cyanocobalamin, Omega 3 Garry Acids, Potassium chloride, Vitamin E   Medication Assistance Findings:  Medication assistance needs identified: Breztri and Eliquis  Patient MAY qualify for LIS/Extra Help. (Application completed)  Patient may also qualify for  Breztri from [employer] and Eliquis from [employer]. However, both programs require patients to spend a percentage of their income on medication expenses. Patient was asked to request a print out from her pharmacy.   Additional medication assistance options reviewed with patient as warranted:  No other options identified  Plan: I will route patient assistance letter to [employer] technician who will coordinate patient assistance program application process for medications listed above.  [facility] pharmacy technician will assist with obtaining all required documents from both patient and provider(s) and submit application(s) once completed.    Follow up in 4-5 weeks.   Elayne Guerin, PharmD, [place] Clinical Pharmacist [phone]

## 2019-03-02 ENCOUNTER — Other Ambulatory Visit: Payer: Self-pay | Admitting: Emergency Medicine

## 2019-03-02 DIAGNOSIS — R053 Chronic cough: Secondary | ICD-10-CM

## 2019-03-02 DIAGNOSIS — R05 Cough: Secondary | ICD-10-CM

## 2019-03-03 ENCOUNTER — Other Ambulatory Visit: Payer: Self-pay | Admitting: Pharmacy Technician

## 2019-03-03 ENCOUNTER — Telehealth: Payer: Self-pay | Admitting: Emergency Medicine

## 2019-03-03 NOTE — Telephone Encounter (Signed)
Called and spoke with pt. Pt said she needed refill of albuterol neb sol. Only neb sol listed on pt's med list is duoneb which was refilled today. Nothing further needed.

## 2019-03-03 NOTE — Patient Outreach (Signed)
Andrea Perry  03/03/2019  Andrea Perry 1951-01-18 [id number]                                        Medication Assistance Referral  Referral From: [place]  Medication/Company: [name] / [employer] Patient application portion:  Mailed Provider application portion: Faxed  to [facility] Provider address/fax verified via: Office website  Medication/Company: Eliquis / [employer] Patient application portion:  Mailed Provider application portion: Faxed  to Dr. Jenkins Rouge Provider address/fax verified via: Office website    Follow up:  Will follow up with patient in 5-10 business days to confirm application(s) have been received.  Aymara Sassi P. Jnai [place], [place] [phone]

## 2019-03-06 ENCOUNTER — Telehealth: Payer: Self-pay | Admitting: Pulmonary Disease

## 2019-03-06 NOTE — Telephone Encounter (Signed)
Received a determination from [facility] for patient's Breztri. Patient has been approved for coverage until 03/02/2020. Patient is also in the process of getting approved for patient assistance for [facility]. Will close this encounter.

## 2019-03-07 ENCOUNTER — Ambulatory Visit: Payer: Medicare Other | Admitting: Cardiovascular Disease

## 2019-03-07 ENCOUNTER — Other Ambulatory Visit: Payer: Self-pay

## 2019-03-07 ENCOUNTER — Encounter: Payer: Self-pay | Admitting: Cardiovascular Disease

## 2019-03-07 VITALS — BP 122/66 | HR 101 | Ht 62.0 in | Wt 266.0 lb

## 2019-03-07 DIAGNOSIS — I480 Paroxysmal atrial fibrillation: Secondary | ICD-10-CM | POA: Diagnosis not present

## 2019-03-07 MED ORDER — FLECAINIDE ACETATE 50 MG PO TABS: 75.0000 mg | ORAL_TABLET | Freq: Two times a day (BID) | ORAL | 3 refills | Status: DC

## 2019-03-07 NOTE — Patient Instructions (Addendum)
Medication Instructions:  Your physician has recommended you make the following change in your medication:  1-INCREASE Flecainide 75 mg by mouth twice daily.  *If you need a refill on your cardiac medications before your next appointment, please call your pharmacy*  Lab Work:  If you have labs (blood work) drawn today and your tests are completely normal, you will receive your results only by: Marland Kitchen MyChart Message (if you have MyChart) OR . A paper copy in the mail If you have any lab test that is abnormal or we need to change your treatment, we will call you to review the results.  Testing/Procedures: Your physician has recommended that you wear an event monitor for 14 days. Event monitors are medical devices that record the heart's electrical activity. Doctors most often [place] these monitors to diagnose arrhythmias. Arrhythmias are problems with the speed or rhythm of the heartbeat. The monitor is a small, portable device. You can wear one while you do your normal daily activities. This is usually used to diagnose what is causing palpitations/syncope (passing out).  Follow-Up: At [facility], you and your health needs are our priority.  As part of our continuing mission to provide you with exceptional heart care, we have created designated Provider Care Teams.  These Care Teams include your primary Cardiologist (physician) and Advanced Practice Providers (APPs -  Physician Assistants and Nurse Practitioners) who all work together to provide you with the care you need, when you need it.  Your physician recommends that you schedule a follow-up appointment in: 3 weeks with Atrial Fib. Clinic with Roderic Palau NP.  Your next appointment:   3 months  The format for your next appointment:   In Person  Provider:   You may see Dr. Johnsie Cancel or one of the following Advanced Practice Providers on your designated Care Team:    Truitt Merle, NP  Cecilie Kicks, NP  Kathyrn Drown, NP

## 2019-03-09 ENCOUNTER — Telehealth: Payer: Self-pay | Admitting: *Deleted

## 2019-03-09 NOTE — Telephone Encounter (Signed)
Have completed patient Andrea Perry pt assistance application for [employer]. Will fax forms after DOD signs provider part.

## 2019-03-13 ENCOUNTER — Other Ambulatory Visit: Payer: Self-pay | Admitting: Pharmacy Technician

## 2019-03-13 ENCOUNTER — Ambulatory Visit (INDEPENDENT_AMBULATORY_CARE_PROVIDER_SITE_OTHER): Payer: Medicare Other | Admitting: Pulmonary Disease

## 2019-03-13 ENCOUNTER — Telehealth: Payer: Self-pay | Admitting: Pulmonary Disease

## 2019-03-13 ENCOUNTER — Encounter: Payer: Self-pay | Admitting: Pulmonary Disease

## 2019-03-13 ENCOUNTER — Telehealth: Payer: Self-pay

## 2019-03-13 ENCOUNTER — Other Ambulatory Visit: Payer: Self-pay

## 2019-03-13 DIAGNOSIS — Z79899 Other long term (current) drug therapy: Secondary | ICD-10-CM

## 2019-03-13 DIAGNOSIS — J438 Other emphysema: Secondary | ICD-10-CM

## 2019-03-13 DIAGNOSIS — J301 Allergic rhinitis due to pollen: Secondary | ICD-10-CM

## 2019-03-13 DIAGNOSIS — I4819 Other persistent atrial fibrillation: Secondary | ICD-10-CM

## 2019-03-13 DIAGNOSIS — I272 Pulmonary hypertension, unspecified: Secondary | ICD-10-CM

## 2019-03-13 DIAGNOSIS — Z7189 Other specified counseling: Secondary | ICD-10-CM

## 2019-03-13 NOTE — Telephone Encounter (Signed)
We received the provider part of a [employer] Pt asst application for this pt. We completed the MD part, DOD Dr Radford Pax signed it and we faxed to [employer] Pt Asst Program on 03/10/2019.  We received a fax from [place] today stating that they have received the pts application.

## 2019-03-13 NOTE — Telephone Encounter (Signed)
14 day ZIO ordered and mailed to pt.  

## 2019-03-13 NOTE — Telephone Encounter (Signed)
03/13/2019 1129  Patient was unable to complete televisit with our office today.  We made 4 attempts to reach the patient.  Unable to leave voicemail was patient's phone rings and rings and then just stops.  This is been an ongoing problem with the patient.  If patient calls back please keep the patient on the phone and reschedule the patient.  Also would be beneficial to obtain different numbers that were able to successfully voicemails as well as to follow-up with the patient.  We will route to triage for them to start contacting the patient 03/14/2019 to get the patient rescheduled for an upcoming follow-up with either Dr. Lamonte Sakai or myself.  Wyn Quaker, FNP

## 2019-03-13 NOTE — Progress Notes (Signed)
Virtual Visit via Telephone Note  I was unable to reach the patient telephonically.  I have made 4 attempts to reach her.  Line continues to ring and then it goes busy.  If the patient contacts our office back we will get her rescheduled.  I was unable to connect with [name] on 03/13/19 at 11:00 AM EST by telephone and verified that I am speaking with the correct person using two identifiers.  Location: Patient: Home Provider: [facility] - [address], [place], [place] [address]    History of Present Illness: 68 year old female former smoker followed in our office for COPD with asthmatic component, history of cotton dust exposure, mild obstructive sleep apnea not currently managed on CPAP  PMH: Allergic rhinitis, hypothyroidism, dyspepsia, low back pain, abdominal pain, diabetes, IBS, hypertension, depression, anxiety Smoker/ Smoking History: Former smoker.  Quit 2005.  45-pack-year smoking history. Maintenance:  Breztri  Pt of: Dr. Lamonte Sakai  03/13/2019  - Visit   Recent cards appt with 03/07/2019 - flecainide 75mg  BID, applied for eliquis pt assistance   Attempted to reach pt at 1111, 1112, and 1113, 1122   Questionaires / Pulmonary Flowsheets:   MMRC: mMRC Dyspnea Scale mMRC Score  02/27/2019 2  02/09/2019 3   Tests:   04/30/2018-chest x-ray-no acute cardiopulmonary process, left basilar atelectasis  10/05/2017-echocardiogram-study was not technically sufficient to allow evaluation of LV diastolic dysfunction due to A. fib, pulmonary artery systolic pressure was within normal range  09/04/2015-split-night sleep study- AHI 4.9  01/11/2014-spirometry- FVC 2.3 (83% predicted), ratio 56, FEV1 1.3 (59% predicted)  02/09/2019-pulmonary function test-FVC 1.48 (53% predicted), postbronchodilator ratio 58, postbronchodilator FEV1 1.07 (50% predicted), positive bronchodilator response, significant mid flow reversibility, DLCO 12.93 (72% predicted)   03/23/2018-CBC with differential-eosinophils relative 1.8, eosinophils absolute 0.2  03/17/2017-CBC with differential-eosinophils relative 3.1, eosinophils absolute 0.3  FENO:  No results found for: NITRICOXIDE  PFT: PFT Results Latest Ref Rng & Units 02/09/2019  FVC-Pre L 1.48  FVC-Predicted Pre % 53  FVC-Post L 1.84  FVC-Predicted Post % 66  Pre FEV1/FVC % % 45  Post FEV1/FCV % % 58  FEV1-Pre L 0.66  FEV1-Predicted Pre % 31  FEV1-Post L 1.07  DLCO UNC% % 72  DLCO COR %Predicted % 81  TLC L 4.73  TLC % Predicted % 102  RV % Predicted % 152    WALK:  SIX MIN WALK 02/09/2019 01/11/2014  Supplimental Oxygen during Test? (L/min) No No  Tech Comments: Patient was able to complete 1 lap while walking at a leisurely pace. Patient did stop 3 times during walk to catch her breath. Denied any chest pain but did have some lower back pain. Patient stated that the lower back pain is not new. During walk, patient's heart rate jumped around from 188, 198, 168, 88 to 154    Imaging: No results found.  Lab Results:  CBC    Component Value Date/Time   WBC 11.7 (H) 02/09/2019 1445   RBC 4.34 02/09/2019 1445   HGB 13.2 02/09/2019 1445   HCT 40.8 02/09/2019 1445   PLT 269.0 02/09/2019 1445   MCV 94.0 02/09/2019 1445   MCH 31.2 03/18/2016 1026   MCHC 32.4 02/09/2019 1445   RDW 15.2 02/09/2019 1445   LYMPHSABS 3.0 02/09/2019 1445   MONOABS 0.8 02/09/2019 1445   EOSABS 0.2 02/09/2019 1445   BASOSABS 0.1 02/09/2019 1445    BMET    Component Value Date/Time   NA 143  02/09/2019 1445   NA 143 09/08/2016 1427   K 4.4 02/09/2019 1445   CL 104 02/09/2019 1445   CO2 28 02/09/2019 1445   GLUCOSE 154 (H) 02/09/2019 1445   BUN 15 02/09/2019 1445   BUN 11 09/08/2016 1427   CREATININE 0.87 02/09/2019 1445   CALCIUM 8.8 02/09/2019 1445   GFRNONAA 97 09/08/2016 1427   GFRAA 112 09/08/2016 1427    BNP No results found for: BNP  ProBNP    Component Value Date/Time   PROBNP 1,611  (H) 02/09/2019 1445    Specialty Problems      Pulmonary Problems   Allergic rhinitis    Qualifier: Diagnosis of  By: Jenny Reichmann MD, Hunt Oris       COPD (chronic obstructive pulmonary disease) ([place])    Qualifier: Diagnosis of  By: Jenny Reichmann MD, Hunt Oris       Cough   Mild obstructive sleep apnea    09/04/2015-split-night sleep study- AHI 4.9      Snoring   COPD exacerbation (HCC)   Shortness of breath      Allergies  Allergen Reactions  . Tramadol Hcl Nausea And Vomiting and Other (See Comments)     mouth dryness, headache  . Codeine Nausea And Vomiting and Nausea Only  . Tape Rash    Plastic tape, bandaids and ekg leads, causes redness and rash  . Vicodin [Hydrocodone-Acetaminophen] Nausea And Vomiting    Immunization History  Administered Date(s) Administered  . Fluad Quad(high Dose 65+) 02/09/2019  . Influenza, High Dose Seasonal PF 03/23/2018  . Influenza,inj,Quad PF,6+ Mos 03/01/2013, 02/14/2014, 03/08/2015, 04/16/2016, 01/12/2017  . Influenza-Unspecified 01/18/2017  . Pneumococcal Conjugate-13 03/30/2013  . Pneumococcal Polysaccharide-23 04/04/2014  . Tdap 09/24/2010    Past Medical History:  Diagnosis Date  . ALLERGIC RHINITIS 08/16/2008  . ANXIETY 08/16/2008  . Arthritis    hands, knees, lower back  . ASTHMA 08/16/2008  . Asthma    BRONCHITIS     DR. Lamonte Sakai    . Bladder leak   . Cancer ([place])    MELANOMA      . Chronic lower back pain    problems with disc L2-5  . COPD 08/16/2008  . DEPRESSION 08/16/2008  . Diabetes mellitus without complication ([place])   . Dysrhythmia    hx AF  . Excessive daytime sleepiness 06/19/2015  . GENITAL HERPES 12/03/2006  . GERD (gastroesophageal reflux disease)   . H/O hiatal hernia   . HEMATOCHEZIA 06/20/2009  . Hemorrhoids   . History of cardioversion 10/30/14  . History of kidney stones   . HYPERLIPIDEMIA 08/16/2008  . HYPERTENSION 12/03/2006  . Hypertension   . Impaired glucose tolerance 09/21/2010  . Overactive bladder 10/20/2015   . Pneumonia    as a child  . Pulmonary HTN ([place]) 06/19/2015  . Shortness of breath dyspnea   . Sleep apnea    MILD NO CPAP ORDERED  . Snoring 06/19/2015    Tobacco History: Social History   Tobacco Use  Smoking Status Former Smoker  . Packs/day: 1.50  . Years: 30.00  . Pack years: 45.00  . Types: Cigarettes  . Quit date: 04/21/2003  . Years since quitting: 15.9  Smokeless Tobacco Never Used   Counseling given: Not Answered   Continue to not smoke  Outpatient Encounter Medications as of 03/13/2019  Medication Sig  . albuterol (PROVENTIL HFA;VENTOLIN HFA) 108 (90 Base) MCG/ACT inhaler Inhale 2 puffs by mouth every 6 hours as needed for wheezing or shortness of breath. **  Yearly physical due in June**  . amLODipine (NORVASC) 10 MG tablet Take 1 tablet by mouth once daily  . Blood Glucose Monitoring Suppl (FREESTYLE LITE) DEVI Use as directed daily E11.9  . Budeson-Glycopyrrol-Formoterol (BREZTRI AEROSPHERE) 160-9-4.8 MCG/ACT AERO Inhale 2 puffs into the lungs 2 (two) times daily.  . cetirizine (ZYRTEC) 10 MG tablet Take 10 mg by mouth daily.   . Cholecalciferol (VITAMIN D) 2000 units tablet Take 2,000 Units by mouth daily.  . Cyanocobalamin (VITAMELTS ENERGY VITAMIN B-12) 1500 MCG TBDP Take 1,500 mcg by mouth daily.  Marland Kitchen dimenhyDRINATE (DRAMAMINE) 50 MG tablet Take 50 mg by mouth daily as needed for nausea.  . diphenoxylate-atropine (LOMOTIL) 2.5-0.025 MG tablet Take 1 tablet by mouth 4 (four) times daily as needed for diarrhea or loose stools.  Marland Kitchen ELIQUIS 5 MG TABS tablet TAKE 1 TABLET BY MOUTH TWICE DAILY  . escitalopram (LEXAPRO) 20 MG tablet Take 1 tablet (20 mg total) by mouth daily.  Marland Kitchen estradiol (ESTRACE) 1 MG tablet Take 1 mg by mouth daily.  . flecainide (TAMBOCOR) 50 MG tablet Take 1.5 tablets (75 mg total) by mouth 2 (two) times daily.  . fluticasone (FLONASE) 50 MCG/ACT nasal spray Place 2 sprays into both nostrils daily.  . furosemide (LASIX) 40 MG tablet Take 1 tablet by  mouth once daily  . glucose blood (FREESTYLE LITE) test strip Use as instructed once daily E11.9  . guaiFENesin (MUCINEX) 600 MG 12 hr tablet Take 1 tablet (600 mg total) by mouth 2 (two) times daily as needed for cough or to loosen phlegm.  Marland Kitchen ipratropium-albuterol (DUONEB) 0.5-2.5 (3) MG/3ML SOLN USE 1 AMPULE IN NEBULIZER 4 TIMES DAILY  . irbesartan (AVAPRO) 150 MG tablet Take 1 tablet by mouth once daily  . Lancets MISC Use as directed once daily E11.9  . levothyroxine (SYNTHROID) 25 MCG tablet Take 1 tablet (25 mcg total) by mouth daily before breakfast.  . LORazepam (ATIVAN) 1 MG tablet Take 1 tablet by mouth twice per day as needed for anxiety.  . metFORMIN (GLUCOPHAGE) 500 MG tablet Take 1 tablet (500 mg total) by mouth daily with breakfast.  . metoprolol tartrate (LOPRESSOR) 50 MG tablet TAKE 1.5 TABLET BY MOUTH 2 TIMES A DAY  . nystatin (MYCOSTATIN/NYSTOP) powder Use as directed twice daily as needed  . Omega-3 Fatty Acids (FISH OIL) 1200 MG CAPS Take 1,200 mg by mouth daily.   . ondansetron (ZOFRAN) 4 MG tablet Take 1 tablet (4 mg total) by mouth every 8 (eight) hours as needed for nausea or vomiting.  . pantoprazole (PROTONIX) 40 MG tablet Take 1 tablet by mouth once daily  . potassium chloride SA (K-DUR,KLOR-CON) 20 MEQ tablet Take 1 tablet (20 mEq total) by mouth daily.  . pravastatin (PRAVACHOL) 40 MG tablet Take 1 tablet by mouth once daily  . Tetrahydrozoline HCl (VISINE OP) Place 1 drop into both eyes daily as needed (dry eyes).  Marland Kitchen tiZANidine (ZANAFLEX) 2 MG tablet Take 1 tablet (2 mg total) by mouth every 6 (six) hours as needed for muscle spasms.  Marland Kitchen tolterodine (DETROL LA) 4 MG 24 hr capsule Take 4 mg by mouth daily.  . vitamin E 400 UNIT capsule Take 400 Units by mouth daily.   No facility-administered encounter medications on file as of 03/13/2019.      Review of Systems  Review of Systems   Physical Exam  Deferred due to telephonic visit   Wt Readings from Last 5  Encounters:  03/07/19 266 lb (120.7 kg)  02/27/19 270 lb (122.5 kg)  02/09/19 268 lb (121.6 kg)  01/17/19 265 lb (120.2 kg)  12/22/18 270 lb (122.5 kg)    BMI Readings from Last 5 Encounters:  03/07/19 48.65 kg/m  02/27/19 49.38 kg/m  02/09/19 49.02 kg/m  01/17/19 48.47 kg/m  12/22/18 49.38 kg/m    Assessment & Plan:   No problem-specific Assessment & Plan notes found for this encounter.    No follow-ups on file.  I discussed the assessment and treatment plan with the patient. The patient was provided an opportunity to ask questions and all were answered. The patient agreed with the plan and demonstrated an understanding of the instructions.   The patient was advised to call back or seek an in-person evaluation if the symptoms worsen or if the condition fails to improve as anticipated.  I provided 0 minutes of non-face-to-face time during this encounter.   Lauraine Rinne, NP

## 2019-03-13 NOTE — Patient Outreach (Signed)
[facility]) Care Management  03/13/2019  [place] January 02, 1951 [id number]   Unsuccessful outreach call placed to patient in regards to BMS application for Eliquis and AZ&ME application for Breztri.  Unfortunately patient did not answer the phone. A HIPAA compliant voicemail was NOT able to be left as a message came on that says "this phone number has calling restrictions that has prevented the completion of this call"  Was calling patient to inquire if she has received the applications that we mailed to her.  Will followup with patient in 5-7 business days.  Damarie Schoolfield P. Brunette Lavalle, [place] Management [phone]

## 2019-03-14 ENCOUNTER — Ambulatory Visit (INDEPENDENT_AMBULATORY_CARE_PROVIDER_SITE_OTHER): Payer: Medicare Other | Admitting: Pulmonary Disease

## 2019-03-14 ENCOUNTER — Other Ambulatory Visit: Payer: Self-pay

## 2019-03-14 ENCOUNTER — Encounter: Payer: Self-pay | Admitting: Pulmonary Disease

## 2019-03-14 DIAGNOSIS — J438 Other emphysema: Secondary | ICD-10-CM | POA: Diagnosis not present

## 2019-03-14 DIAGNOSIS — Z79899 Other long term (current) drug therapy: Secondary | ICD-10-CM

## 2019-03-14 DIAGNOSIS — G4733 Obstructive sleep apnea (adult) (pediatric): Secondary | ICD-10-CM | POA: Diagnosis not present

## 2019-03-14 DIAGNOSIS — I4819 Other persistent atrial fibrillation: Secondary | ICD-10-CM | POA: Diagnosis not present

## 2019-03-14 DIAGNOSIS — I272 Pulmonary hypertension, unspecified: Secondary | ICD-10-CM

## 2019-03-14 DIAGNOSIS — Z7189 Other specified counseling: Secondary | ICD-10-CM

## 2019-03-14 MED ORDER — BREZTRI AEROSPHERE 160-9-4.8 MCG/ACT IN AERO: 2.0000 | INHALATION_SPRAY | Freq: Two times a day (BID) | RESPIRATORY_TRACT | 0 refills | Status: DC

## 2019-03-14 NOTE — Assessment & Plan Note (Signed)
Plan: Continue Lasix 40 mg daily Keep scheduled follow-up with cardiology Emphasized need for patient to weigh herself in the morning Emphasized need for her to follow low-sodium diet Keep follow-up with primary care

## 2019-03-14 NOTE — Progress Notes (Signed)
Virtual Visit via Telephone Note  I connected with Andrea Perry on 03/14/19 at 12:00 PM EST by telephone and verified that I am speaking with the correct person using two identifiers.  Location: Patient: Home Provider: Office [facility] - [id number], [place], [place], [place] [address]   I discussed the limitations, risks, security and privacy concerns of performing an evaluation and management service by telephone and the availability of in person appointments. I also discussed with the patient that there may be a patient responsible charge related to this service. The patient expressed understanding and agreed to proceed.  Patient consented to consult via telephone: Yes People present and their role in pt care: Pt     History of Present Illness:  68 year old female former smoker followed in our office for COPD with asthmatic component, history of cotton dust exposure, mild obstructive sleep apnea not currently managed on CPAP  PMH: Allergic rhinitis, hypothyroidism, dyspepsia, low back pain, abdominal pain, diabetes, IBS, hypertension, depression, anxiety Smoker/ Smoking History: Former smoker.  Quit 2005.  45-pack-year smoking history. Maintenance:  Breztri  Pt of: Dr. Lamonte Sakai  Immunization History  Administered Date(s) Administered  . Fluad Quad(high Dose 65+) 02/09/2019  . Influenza, High Dose Seasonal PF 03/23/2018  . Influenza,inj,Quad PF,6+ Mos 03/01/2013, 02/14/2014, 03/08/2015, 04/16/2016, 01/12/2017  . Influenza-Unspecified 01/18/2017  . Pneumococcal Conjugate-13 03/30/2013  . Pneumococcal Polysaccharide-23 04/04/2014  . Tdap 09/24/2010    Social History   Tobacco Use  Smoking Status Former Smoker  . Packs/day: 1.50  . Years: 30.00  . Pack years: 45.00  . Types: Cigarettes  . Quit date: 04/21/2003  . Years since quitting: 15.9  Smokeless Tobacco Never Used    Chief complaint: Rescheduled appointment from 03/13/2019 televisit  68 year old female  former smoker followed in our office for COPD.  Patient is also been followed recently due to worsening dyspnea on exertion.  She continues to work with cardiology she was last seen on 03/07/2019 where flecainide was increased to 75 mg twice daily, patient was also restarted on Eliquis, patient applied for patient assistance through cardiology for Eliquis as well as patient will be established with a A. fib clinic in December/2020.  Patient reports that since losing some of her water weight she feels that she is breathing better.  She enjoys her inhaler.  Unfortunately she is in the donut hole so this makes the inhaler cost over $100.  She is concerns about whether or not she can afford that.  She is still maintained on samples.  She is working [facility] for medication assistance.  Patient is still not weighing herself regularly.  She cannot tell me exactly what her weight was yesterday she believes it is between 264 to 265 pounds.  Observations/Objective:  03/13/2019 - weight - 264/265  04/30/2018-chest x-ray-no acute cardiopulmonary process, left basilar atelectasis  10/05/2017-echocardiogram-study was not technically sufficient to allow evaluation of LV diastolic dysfunction due to A. fib, pulmonary artery systolic pressure was within normal range  09/04/2015-split-night sleep study- AHI 4.9  01/11/2014-spirometry- FVC 2.3 (83% predicted), ratio 56, FEV1 1.3 (59% predicted)  02/09/2019-pulmonary function test-FVC 1.48 (53% predicted), postbronchodilator ratio 58, postbronchodilator FEV1 1.07 (50% predicted), positive bronchodilator response, significant mid flow reversibility, DLCO 12.93 (72% predicted)   03/23/2018-CBC with differential-eosinophils relative 1.8, eosinophils absolute 0.2  03/17/2017-CBC with differential-eosinophils relative 3.1, eosinophils absolute 0.3   Assessment and Plan:  A-fib ([place]) Plan: Established with A. fib clinic as ordered by  cardiology Continue cardiology  follow-up Continue Eliquis Continue flecainide   Pulmonary HTN (HCC) Plan: Continue Lasix 40 mg daily Keep scheduled follow-up with cardiology Emphasized need for patient to weigh herself in the morning Emphasized need for her to follow low-sodium diet Keep follow-up with primary care  COPD (chronic obstructive pulmonary disease) Pulmonary function testing reviewed today showing strong asthmatic component Patient reports symptomatic improvement when on maintenance inhalers Blood work showing peripheral eosinophilia  Plan: Continue Breztri Continue DuoNeb nebulized medication as needed for shortness of breath or wheezing Samples provided today Continue [facility] Follow-up in 8 to 12 weeks  Mild obstructive sleep apnea Plan: Continue to monitor clinically May need to consider repeating home sleep study at some point if patient becomes more symptomatic  Complex care coordination Plan: Reviewed upcoming appointments with patient. Patient confirms she knows that she has an A. fib clinic appointment in December Upcoming appointment with primary care Upcoming appointment with cardiology We will bring patient in to our office in 8 to 12 weeks to see Dr. Lamonte Sakai  Medication management Patient continues to struggle with medication access Patient reporting she is in the donut hole Patient actively working with [facility] to obtain patient assistance for her medications Patient reporting she is back on Eliquis  Plan: Samples of Breztri today  Continue Eliquis Continue [facility] with [facility]   Follow Up Instructions:  Return in about 2 months (around 05/14/2019), or if symptoms worsen or fail to improve, for Follow up with Dr. Lamonte Sakai.   I discussed the assessment and treatment plan with the patient. The patient was provided an opportunity to ask questions and all were answered. The patient  agreed with the plan and demonstrated an understanding of the instructions.   The patient was advised to call back or seek an in-person evaluation if the symptoms worsen or if the condition fails to improve as anticipated.  I provided 23 minutes of non-face-to-face time during this encounter.   Lauraine Rinne, NP

## 2019-03-14 NOTE — Assessment & Plan Note (Signed)
Pulmonary function testing reviewed today showing strong asthmatic component Patient reports symptomatic improvement when on maintenance inhalers Blood work showing peripheral eosinophilia  Plan: Continue Breztri Continue DuoNeb nebulized medication as needed for shortness of breath or wheezing Samples provided today [facility] Follow-up in 8 to 12 weeks

## 2019-03-14 NOTE — Assessment & Plan Note (Signed)
Patient continues to struggle with medication access Patient reporting she is in the donut hole Patient actively working with [facility] to obtain patient assistance for her medications Patient reporting she is back on Eliquis  Plan: Samples of Breztri today  Continue Eliquis Continue [facility]

## 2019-03-14 NOTE — Telephone Encounter (Signed)
Was able to reach patient, she stated that her phone died.  Patient rescheduled, call transferred. Nothing further needed at this time.

## 2019-03-14 NOTE — Telephone Encounter (Signed)
ATC pt, no answer. Left message for pt to call back.  

## 2019-03-14 NOTE — Patient Instructions (Addendum)
You were seen today by Lauraine Rinne, NP  for:   1. Persistent atrial fibrillation ([place])  Establish with A. fib clinic Continue follow-up with cardiology Continue follow-up with primary care  Continue Eliquis Continue flecainide  2. Pulmonary HTN (HCC)  Continue Lasix 40 mg daily  Weigh yourself every day in the morning, record these weights  3. Other emphysema ([place])  Continue Breztri >>> 2 puffs in the morning right when you wake up, rinse out your mouth after use, 12 hours later 2 puffs, rinse after use >>> Take this daily, no matter what >>> This is not a rescue inhaler   4. Mild obstructive sleep apnea  If you start noticing that you are having worsened sleep or you are waking up often at night please contact your office we may need to consider repeating a sleep study  5. Complex care coordination  As we emphasize at every office visit please keep your appointments as scheduled.  If you have worsening symptoms please contact primary care or the specialist that is managing those symptoms for you.  If you run out of medication or do not have access to medication please notify the provider who is prescribing that  6. Medication management  Samples of Breztri today    We recommend today:  No orders of the defined types were placed in this encounter.  No orders of the defined types were placed in this encounter.  Meds ordered this encounter  Medications  . Budeson-Glycopyrrol-Formoterol (BREZTRI AEROSPHERE) 160-9-4.8 MCG/ACT AERO    Sig: Inhale 2 puffs into the lungs 2 (two) times daily.    Dispense:  11.8 g    Refill:  0    Order Specific Question:   Lot Number?    Answer:   [id number]    Order Specific Question:   Expiration Date?    Answer:   08/17/2020    Order Specific Question:   Manufacturer?    Answer:   [employer] [[id number]]    Follow Up:    Return in about 2 months (around 05/14/2019), or if symptoms worsen or fail to improve, for Follow up with Dr. Lamonte Sakai.   Please do your part to reduce the spread of COVID-19:      Reduce your risk of any infection  and COVID19 by using the similar precautions used for avoiding the common cold or flu:  Marland Kitchen Wash your hands often with soap and warm water for at least 20 seconds.  If soap and water are not readily available, use an alcohol-based hand sanitizer with at least 60% alcohol.  . If coughing or sneezing, cover your mouth and nose by coughing or sneezing into the elbow areas of your shirt or coat, into a tissue or into your sleeve (not your hands). Langley Gauss A MASK when in public  . Avoid shaking hands with others and consider head nods or verbal greetings only. . Avoid touching your eyes, nose, or mouth with unwashed hands.  . Avoid close contact with people who are sick. . Avoid places or events with large numbers of people in one location, like concerts or sporting events. . If you have some symptoms but not all symptoms, continue to monitor at home and seek medical attention if your symptoms worsen. . If you are having a medical emergency, call 911.   [place] / e-Visit: [email or web address]         [facility]: [place]  [facility]: [facility]: [id number]     It is flu season:   >>> Best ways to protect herself from the flu: Receive the yearly flu vaccine, practice good hand hygiene washing with soap and also using hand sanitizer when available, eat a nutritious meals, get adequate rest, hydrate appropriately   Please contact the office if your symptoms worsen or you have concerns that you are not improving.   Thank you for choosing [facility] for your healthcare, and for allowing [place] to partner with you on your healthcare journey. I am thankful to be able to provide care to you today.   Wyn Quaker FNP-C

## 2019-03-14 NOTE — Assessment & Plan Note (Signed)
Plan: Continue to monitor clinically May need to consider repeating home sleep study at some point if patient becomes more symptomatic

## 2019-03-14 NOTE — Assessment & Plan Note (Signed)
Plan: Reviewed upcoming appointments with patient. Patient confirms she knows that she has an A. fib clinic appointment in December Upcoming appointment with primary care Upcoming appointment with cardiology We will bring patient in to our office in 8 to 12 weeks to see Dr. Lamonte Sakai

## 2019-03-14 NOTE — Assessment & Plan Note (Signed)
Plan: Established with A. fib clinic as ordered by cardiology Continue cardiology follow-up Continue Eliquis Continue flecainide

## 2019-03-14 NOTE — Telephone Encounter (Signed)
Pt returned missed call and would like a call back 

## 2019-03-21 ENCOUNTER — Other Ambulatory Visit: Payer: Self-pay | Admitting: Pharmacy Technician

## 2019-03-21 ENCOUNTER — Other Ambulatory Visit: Payer: Self-pay

## 2019-03-21 MED ORDER — APIXABAN 5 MG PO TABS: 5.0000 mg | ORAL_TABLET | Freq: Two times a day (BID) | ORAL | 5 refills | Status: DC

## 2019-03-21 NOTE — Telephone Encounter (Signed)
**Note De-Identified Andrea Perry Obfuscation** This call was sent to me in error. The pt is requesting that we mail her a Eliquis prescription to her home so she can use her free 30 day card that we gave her.  When asked how much Eliquis she has on hand she said "none". I advised her of the importance that she take this medication as directed and that she cannot miss a dose. She is in agreement with coming to office now as waiting for the mail will take too long and she needs some Eliquis now.  She is advised that I am sending this message to our refill pool to get her a prescription for Eliquis 5 mg #30 with 6 refills printed and signed by her cardiologist is Dr Johnsie Cancel or the DOD on site and 1 sample bottle of Eliquis 5 mg to leave in the downstairs lobby of Dr Mariana Arn office for her to pick up.   She is aware that if she is not approved for pt asst and cannot afford Eliquis she may need to be switched to a less costing anticoagulant.

## 2019-03-21 NOTE — Patient Outreach (Signed)
[place] [facility]) Care Management  03/21/2019  Carden Krizman Nath 09-23-1950 [id number]    Successful outgoing call placed to patient in regards to BMS application for Eliquis and AZ&ME application for Breztri.  Spoke to patient, HIPAA identifiers verified.  Patient informed she does not recall getting the applications in the mail but she is not sure. She requested the applications be re mailed to her. Verified address for applications to be mailed.  Prepared and placed in outgoing mail today. Note: it appears BMS has already received some pieces of the application from both patient and provider. However it is unknown what is submitted. Therefore application with a detail list of required supporting documents will be sent to patient.  Will followup with patient in 5-7 business days to confirm applications were received.  Tomi Paddock P. Jenetta Wease, [place] [facility] [phone]

## 2019-03-21 NOTE — Telephone Encounter (Signed)
Prescription is printed. Waiting for Dr. Marlou Porch to sign

## 2019-03-21 NOTE — Telephone Encounter (Signed)
**Note De-Identified Twanna Resh Obfuscation** Per Susy Frizzle, CPhT she called Loren back and answered question of "is the pt receiving treatment as an out patient" Answer: Yes

## 2019-03-21 NOTE — Patient Outreach (Signed)
[place] [facility]) Care Management  03/21/2019  Mazola Klepper Schirtzinger [birth year] [id number]    ADDENDUM  Care coordination call placed to BMS in regards to Eliquis application.  Spoke to [name] who informed that there was a missing piece of information concerning the question is patient being treated as in or out patient. Informed [name] that patient  is being treated as an outpatient.  [name] informed to check back later in the week to inquire if a determination has been made.  Will follow up with BMS in 5-7 business days.  Resha Filippone P. Jeris Easterly, [place] Management [phone]

## 2019-03-21 NOTE — Telephone Encounter (Signed)
Follow up:     Loren from [place] calling concering this patient. Please call back today.

## 2019-03-24 ENCOUNTER — Other Ambulatory Visit: Payer: Self-pay | Admitting: Pharmacy Technician

## 2019-03-24 NOTE — Patient Outreach (Signed)
**Note De-Identified Perry Obfuscation** Andrea Perry [facility]) Care Management  03/24/2019  [place] Sep 14, 1950 [id number]  Care coordination call placed to BMS in regards to patient's Eliquis application.  Received in basket message from provider's LPN, Andrea Perry, that they received notice that information was missing.  Spoke to Andrea Perry at [facility] who informed they received the provider's portion of the application but was missing the patient's portion of the application. Patient was mailed another application earlier this week.  Will followup with patient as previously scheduled.  Andrea Perry P. Andrea Perry, [place] Management [phone]

## 2019-03-25 ENCOUNTER — Other Ambulatory Visit: Payer: Self-pay | Admitting: Internal Medicine

## 2019-03-27 ENCOUNTER — Telehealth: Payer: Self-pay | Admitting: Cardiovascular Disease

## 2019-03-27 NOTE — Telephone Encounter (Signed)
New Message   Andrea Perry is calling from [facility] about an Artist. She says they are Needing to confirm some information in order to process request     Please call

## 2019-03-27 NOTE — Telephone Encounter (Signed)
Done erx 

## 2019-03-27 NOTE — Telephone Encounter (Signed)
Jill Simcox, CPhT is aware and is taking care of this.

## 2019-03-28 ENCOUNTER — Encounter (HOSPITAL_COMMUNITY): Payer: Self-pay | Admitting: Nurse Practitioner

## 2019-03-28 ENCOUNTER — Other Ambulatory Visit: Payer: Self-pay

## 2019-03-28 ENCOUNTER — Ambulatory Visit (HOSPITAL_COMMUNITY)
Admission: RE | Admit: 2019-03-28 | Discharge: 2019-03-28 | Disposition: A | Discharge status: Home / Self Care | Payer: Medicare Other | Source: Ambulatory Visit | Attending: Nurse Practitioner | Admitting: Nurse Practitioner

## 2019-03-28 VITALS — BP 114/84 | HR 98 | Ht 62.0 in | Wt 272.8 lb

## 2019-03-28 DIAGNOSIS — K219 Gastro-esophageal reflux disease without esophagitis: Secondary | ICD-10-CM | POA: Diagnosis not present

## 2019-03-28 DIAGNOSIS — E119 Type 2 diabetes mellitus without complications: Secondary | ICD-10-CM | POA: Insufficient documentation

## 2019-03-28 DIAGNOSIS — I480 Paroxysmal atrial fibrillation: Secondary | ICD-10-CM | POA: Diagnosis not present

## 2019-03-28 DIAGNOSIS — Z87891 Personal history of nicotine dependence: Secondary | ICD-10-CM | POA: Diagnosis not present

## 2019-03-28 DIAGNOSIS — Z7901 Long term (current) use of anticoagulants: Secondary | ICD-10-CM | POA: Insufficient documentation

## 2019-03-28 DIAGNOSIS — D6869 Other thrombophilia: Secondary | ICD-10-CM

## 2019-03-28 DIAGNOSIS — F329 Major depressive disorder, single episode, unspecified: Secondary | ICD-10-CM | POA: Diagnosis not present

## 2019-03-28 DIAGNOSIS — I4892 Unspecified atrial flutter: Secondary | ICD-10-CM | POA: Insufficient documentation

## 2019-03-28 DIAGNOSIS — E785 Hyperlipidemia, unspecified: Secondary | ICD-10-CM | POA: Diagnosis not present

## 2019-03-28 DIAGNOSIS — Z79899 Other long term (current) drug therapy: Secondary | ICD-10-CM | POA: Insufficient documentation

## 2019-03-28 DIAGNOSIS — M1389 Other specified arthritis, multiple sites: Secondary | ICD-10-CM | POA: Insufficient documentation

## 2019-03-28 DIAGNOSIS — I4819 Other persistent atrial fibrillation: Secondary | ICD-10-CM

## 2019-03-28 DIAGNOSIS — J449 Chronic obstructive pulmonary disease, unspecified: Secondary | ICD-10-CM | POA: Diagnosis not present

## 2019-03-28 DIAGNOSIS — B009 Herpesviral infection, unspecified: Secondary | ICD-10-CM | POA: Diagnosis not present

## 2019-03-28 DIAGNOSIS — F419 Anxiety disorder, unspecified: Secondary | ICD-10-CM | POA: Insufficient documentation

## 2019-03-28 DIAGNOSIS — I10 Essential (primary) hypertension: Secondary | ICD-10-CM | POA: Diagnosis not present

## 2019-03-28 DIAGNOSIS — Z7984 Long term (current) use of oral hypoglycemic drugs: Secondary | ICD-10-CM | POA: Insufficient documentation

## 2019-03-28 LAB — CBC
HCT: 38.6 % (ref 36.0–46.0)
Hemoglobin: 12 g/dL (ref 12.0–15.0)
MCH: 30.2 pg (ref 26.0–34.0)
MCHC: 31.1 g/dL (ref 30.0–36.0)
MCV: 97.2 fL (ref 80.0–100.0)
Platelets: 322 10*3/uL (ref 150–400)
RBC: 3.97 MIL/uL (ref 3.87–5.11)
RDW: 14.7 % (ref 11.5–15.5)
WBC: 9.9 10*3/uL (ref 4.0–10.5)
nRBC: 0 % (ref 0.0–0.2)

## 2019-03-28 LAB — BASIC METABOLIC PANEL
Anion gap: 13 (ref 5–15)
BUN: 21 mg/dL (ref 8–23)
CO2: 25 mmol/L (ref 22–32)
Calcium: 8.6 mg/dL — ABNORMAL LOW (ref 8.9–10.3)
Chloride: 102 mmol/L (ref 98–111)
Creatinine, Ser: 0.92 mg/dL (ref 0.44–1.00)
GFR calc Af Amer: >60 mL/min (ref >60)
GFR calc non Af Amer: >60 mL/min (ref >60)
Glucose, Bld: 151 mg/dL — ABNORMAL HIGH (ref 70–99)
Potassium: 4.4 mmol/L (ref 3.5–5.1)
Sodium: 140 mmol/L (ref 135–145)

## 2019-03-28 NOTE — Patient Instructions (Signed)
Cardioversion scheduled for December 14th   - Arrive at the [facility] and go to admitting at 10:00am  -Do not eat or drink anything after midnight the night prior to your procedure.  - Take all your morning medications with a sip of water prior to arrival.  - You will not be able to drive home after your procedure.

## 2019-03-28 NOTE — H&P (View-Only) (Signed)
Primary Care Physician: Biagio Borg, MD Referring Physician: Dr. Rae Lips is a 68 y.o. female  diastolic dysfunction,prior tobacco use, COPD, adult asthma,  DM, HTN, HLD. Started on Glucophage by Dr Jenny Reichmann in June A1c was elevated over 8.1  She has had long  standing PAF and tends  to retain fluid when she is in afib. Was supposed to have DCCV  in June 2019 but indicated she could not afford eliquis and cancelled procedure No history of CAD last normal myovue was 2016    She was seen by Dr. Johnsie Cancel in November and was off anticoagulation for cost issues. It was restarted and she was asked to f/u in afib clinic to set up DCCV. She remains in afib. She states has been on Eliquis 5 mg bid  for at least 3 weeks without interruption, CHA2DS2VASc score is at least 4. . Her flecainide was increased to 75 mg bid at that visit 11/17. She remains in rate controlled afib today. Has chronic LLE, swollen today but this is more her norm. If not tolerable to pt, she will increase lasix x 3 days. She is not usually aware of when she is in afib. Rare alcohol use, no tobacco, mild OSA, not on cpap, sedentary and with BMI of 49.90.  Today, she denies symptoms of palpitations, chest pain, shortness of breath, orthopnea, PND, lower extremity edema, dizziness, presyncope, syncope, or neurologic sequela. The patient is tolerating medications without difficulties and is otherwise without complaint today.   Past Medical History:  Diagnosis Date  . ALLERGIC RHINITIS 08/16/2008  . ANXIETY 08/16/2008  . Arthritis    hands, knees, lower back  . ASTHMA 08/16/2008  . Asthma    BRONCHITIS     DR. Lamonte Sakai    . Bladder leak   . Cancer ([place])    MELANOMA      . Chronic lower back pain    problems with disc L2-5  . COPD 08/16/2008  . DEPRESSION 08/16/2008  . Diabetes mellitus without complication ([place])   . Dysrhythmia    hx AF  . Excessive daytime sleepiness 06/19/2015  . GENITAL HERPES 12/03/2006  . GERD  (gastroesophageal reflux disease)   . H/O hiatal hernia   . HEMATOCHEZIA 06/20/2009  . Hemorrhoids   . History of cardioversion 10/30/14  . History of kidney stones   . HYPERLIPIDEMIA 08/16/2008  . HYPERTENSION 12/03/2006  . Hypertension   . Impaired glucose tolerance 09/21/2010  . Overactive bladder 10/20/2015  . Pneumonia    as a child  . Pulmonary HTN ([place]) 06/19/2015  . Shortness of breath dyspnea   . Sleep apnea    MILD NO CPAP ORDERED  . Snoring 06/19/2015   Past Surgical History:  Procedure Laterality Date  . ABDOMINAL HYSTERECTOMY    . APPENDECTOMY    . CARDIOVERSION N/A 03/20/2014   Procedure: CARDIOVERSION;  Surgeon: Josue Hector, MD;  Location: [facility] ENDOSCOPY;  Service: Cardiovascular;  Laterality: N/A;  . CARDIOVERSION N/A 10/30/2014   Procedure: CARDIOVERSION;  Surgeon: Josue Hector, MD;  Location: [facility] ENDOSCOPY;  Service: Cardiovascular;  Laterality: N/A;  . CARPAL TUNNEL RELEASE     Right + LEFT  . CESAREAN SECTION     x 1   . COLONOSCOPY  2004  . CYST EXCISION     RT ARM   . CYSTOCELE REPAIR N/A 10/25/2012   Procedure: ANTERIOR REPAIR (CYSTOCELE);  Surgeon: Gus Height, MD;  Location: [facility];  Service: Gynecology;  Laterality: N/A;  . HEEL SPUR SURGERY Bilateral   . KNEE SURGERY     Right + LEFT  . LUMBAR LAMINECTOMY/DECOMPRESSION MICRODISCECTOMY N/A 03/19/2016   Procedure: Laminectomy and Foraminotomy - Lumbar three-four - Lumbar four-five;  Surgeon: Eustace Moore, MD;  Location: [place];  Service: Neurosurgery;  Laterality: N/A;  . RADIOLOGY WITH ANESTHESIA Right 11/01/2014   Procedure: MRI RIGHT FOREARM;  Surgeon: Medication Radiologist, MD;  Location: [place];  Service: Radiology;  Laterality: Right;  . RADIOLOGY WITH ANESTHESIA Right 08/29/2015   Procedure: MRI - RIGHT FOREARM;  Surgeon: Medication Radiologist, MD;  Location: [place];  Service: Radiology;  Laterality: Right;  . RADIOLOGY WITH ANESTHESIA N/A 12/05/2015   Procedure: MRI SPINE WITHOUT;  Surgeon: Medication  Radiologist, MD;  Location: [place];  Service: Radiology;  Laterality: N/A;  . SKIN CANCER EXCISION     BILAT SHOULDERS  . SPLIT NIGHT STUDY  09/04/2015  . TONSILLECTOMY AND ADENOIDECTOMY      Current Outpatient Medications  Medication Sig Dispense Refill  . albuterol (PROVENTIL HFA;VENTOLIN HFA) 108 (90 Base) MCG/ACT inhaler Inhale 2 puffs by mouth every 6 hours as needed for wheezing or shortness of breath. **Yearly physical due in June** 1 Inhaler 11  . amLODipine (NORVASC) 10 MG tablet Take 1 tablet by mouth once daily 90 tablet 3  . apixaban (ELIQUIS) 5 MG TABS tablet Take 1 tablet (5 mg total) by mouth 2 (two) times daily. 60 tablet 5  . Blood Glucose Monitoring Suppl (FREESTYLE LITE) DEVI Use as directed daily E11.9 1 each 0  . Budeson-Glycopyrrol-Formoterol (BREZTRI AEROSPHERE) 160-9-4.8 MCG/ACT AERO Inhale 2 puffs into the lungs 2 (two) times daily. 10.7 g 4  . cetirizine (ZYRTEC) 10 MG tablet Take 10 mg by mouth daily.     . Cholecalciferol (VITAMIN D) 2000 units tablet Take 2,000 Units by mouth daily.    . Cyanocobalamin (VITAMELTS ENERGY VITAMIN B-12) 1500 MCG TBDP Take 1,500 mcg by mouth daily.    Marland Kitchen dimenhyDRINATE (DRAMAMINE) 50 MG tablet Take 50 mg by mouth daily as needed for nausea.    . diphenoxylate-atropine (LOMOTIL) 2.5-0.025 MG tablet Take 1 tablet by mouth 4 (four) times daily as needed for diarrhea or loose stools. 40 tablet 0  . escitalopram (LEXAPRO) 20 MG tablet Take 1 tablet by mouth once daily 90 tablet 3  . estradiol (ESTRACE) 1 MG tablet Take 1 mg by mouth daily.    . flecainide (TAMBOCOR) 50 MG tablet Take 1.5 tablets (75 mg total) by mouth 2 (two) times daily. 270 tablet 3  . fluticasone (FLONASE) 50 MCG/ACT nasal spray Place 2 sprays into both nostrils daily. 16 g 2  . furosemide (LASIX) 40 MG tablet Take 1 tablet by mouth once daily 90 tablet 1  . glucose blood (FREESTYLE LITE) test strip Use as instructed once daily E11.9 100 each 12  . guaiFENesin (MUCINEX)  600 MG 12 hr tablet Take 1 tablet (600 mg total) by mouth 2 (two) times daily as needed for cough or to loosen phlegm. 60 tablet 2  . ipratropium-albuterol (DUONEB) 0.5-2.5 (3) MG/3ML SOLN USE 1 AMPULE IN NEBULIZER 4 TIMES DAILY 360 mL 12  . irbesartan (AVAPRO) 150 MG tablet Take 1 tablet by mouth once daily 90 tablet 0  . Lancets MISC Use as directed once daily E11.9 100 each 11  . levothyroxine (SYNTHROID) 25 MCG tablet Take 1 tablet (25 mcg total) by mouth daily before breakfast. 90 tablet 3  . LORazepam (ATIVAN) 1 MG  tablet Take 1 tablet by mouth twice daily as needed for anxiety 60 tablet 5  . metFORMIN (GLUCOPHAGE) 500 MG tablet Take 1 tablet (500 mg total) by mouth daily with breakfast. 90 tablet 3  . metoprolol tartrate (LOPRESSOR) 50 MG tablet TAKE 1.5 TABLET BY MOUTH 2 TIMES A DAY 270 tablet 3  . nystatin (MYCOSTATIN/NYSTOP) powder Use as directed twice daily as needed 30 g 1  . Omega-3 Fatty Acids (FISH OIL) 1200 MG CAPS Take 1,200 mg by mouth daily.     . ondansetron (ZOFRAN) 4 MG tablet Take 1 tablet (4 mg total) by mouth every 8 (eight) hours as needed for nausea or vomiting. 30 tablet 1  . pantoprazole (PROTONIX) 40 MG tablet Take 1 tablet by mouth once daily 90 tablet 1  . potassium chloride SA (K-DUR,KLOR-CON) 20 MEQ tablet Take 1 tablet (20 mEq total) by mouth daily. 90 tablet 3  . pravastatin (PRAVACHOL) 40 MG tablet Take 1 tablet by mouth once daily 90 tablet 1  . Tetrahydrozoline HCl (VISINE OP) Place 1 drop into both eyes daily as needed (dry eyes).    . tolterodine (DETROL LA) 4 MG 24 hr capsule Take 4 mg by mouth daily.    . vitamin E 400 UNIT capsule Take 400 Units by mouth daily.     No current facility-administered medications for this encounter.     Allergies  Allergen Reactions  . Tramadol Hcl Nausea And Vomiting and Other (See Comments)     mouth dryness, headache  . Codeine Nausea And Vomiting and Nausea Only  . Tape Rash    Plastic tape, bandaids and ekg  leads, causes redness and rash  . Vicodin [Hydrocodone-Acetaminophen] Nausea And Vomiting    Social History   Socioeconomic History  . Marital status: Widowed    Spouse name: Not on file  . Number of children: 1  . Years of education: Not on file  . Highest education level: Not on file  Occupational History  . Occupation: currently unemployed, former [employer]  Social Needs  . Financial resource strain: Not on file  . Food insecurity    Worry: Not on file    Inability: Not on file  . Transportation needs    Medical: Not on file    Non-medical: Not on file  Tobacco Use  . Smoking status: Former Smoker    Packs/day: 1.50    Years: 30.00    Pack years: 45.00    Types: Cigarettes    Quit date: 04/21/2003    Years since quitting: 15.9  . Smokeless tobacco: Never Used  Substance and Sexual Activity  . Alcohol use: Yes    Alcohol/week: 2.0 standard drinks    Types: 2 Standard drinks or equivalent per week    Comment: OCCAS  . Drug use: No  . Sexual activity: Not Currently    Birth control/protection: None  Lifestyle  . Physical activity    Days per week: Not on file    Minutes per session: Not on file  . Stress: Not on file  Relationships  . Social Herbalist on phone: Not on file    Gets together: Not on file    Attends religious service: Not on file    Active member of club or organization: Not on file    Attends meetings of clubs or organizations: Not on file    Relationship status: Not on file  . Intimate partner violence    Fear  of current or ex partner: Not on file    Emotionally abused: Not on file    Physically abused: Not on file    Forced sexual activity: Not on file  Other Topics Concern  . Not on file  Social History Narrative   3 caffeine drinks daily     Family History  Problem Relation Age of Onset  . Diabetes Mother   . Hyperlipidemia Mother   . Hypertension Mother   . Colon polyps Mother   . Stroke Father   .  Colon cancer Neg Hx     ROS- All systems are reviewed and negative except as per the HPI above  Physical Exam: Vitals:   03/28/19 0846  BP: 114/84  Pulse: 98  Weight: 123.7 kg  Height: 5\' 2"  (1.575 m)   Wt Readings from Last 3 Encounters:  03/28/19 123.7 kg  03/07/19 120.7 kg  02/27/19 122.5 kg    Labs: Lab Results  Component Value Date   NA 143 02/09/2019   K 4.4 02/09/2019   CL 104 02/09/2019   CO2 28 02/09/2019   GLUCOSE 154 (H) 02/09/2019   BUN 15 02/09/2019   CREATININE 0.87 02/09/2019   CALCIUM 8.8 02/09/2019   Lab Results  Component Value Date   INR 1.00 03/18/2016   Lab Results  Component Value Date   CHOL 163 03/23/2018   HDL 41.90 03/23/2018   LDLCALC 87 03/23/2018   TRIG 168.0 (H) 03/23/2018     GEN- The patient is well appearing, alert and oriented x 3 today.   Head- normocephalic, atraumatic Eyes-  Sclera clear, conjunctiva pink Ears- hearing intact Oropharynx- clear Neck- supple, no JVP Lymph- no cervical lymphadenopathy Lungs- Clear to ausculation bilaterally, normal work of breathing Heart- irregular rate and rhythm, no murmurs, rubs or gallops, PMI not laterally displaced GI- soft, NT, ND, + BS Extremities- no clubbing, cyanosis, or edema MS- no significant deformity or atrophy Skin- no rash or lesion Psych- euthymic mood, full affect Neuro- strength and sensation are intact  EKG-afib at 98 bpm, qrs 90 ms, qtc 503 ms.   Echo- 10/05/17-Study Conclusions  - Left ventricle: The cavity size was normal. - Aortic valve: Mildly calcified annulus. - Right atrium: The atrium was mildly dilated.  Assessment and Plan: 1. Paroxysmal afib Appears to have been in persistent since at least June? Will plan for DCCV, pt has had DCCV's in the past. Risk vrs benefit explained Continue flecainide 75 mg bid   Continue metoprolol tartrate 50 mg 1 1/2 tabs bid  bmet/mag  2. CHA2DS2VASc score of at least 4  States no missed doses of eliquis 5 mg  bid x at least 3 weeks  Pt brings in a zio patch that was ordered by Dr. Johnsie Cancel 3 weeks ago, will now wait and place on pt at one week post  cardioversion visit   Butch Penny C. [place] Luppino, [facility] [address] [place],  [id number] [phone]

## 2019-03-28 NOTE — Progress Notes (Signed)
Primary Care Physician: Biagio Borg, MD Referring Physician: Dr. Rae Lips is a 68 y.o. female  diastolic dysfunction,prior tobacco use, COPD, adult asthma,  DM, HTN, HLD. Started on Glucophage by Dr Jenny Reichmann in June A1c was elevated over 8.1  She has had long  standing PAF and tends  to retain fluid when she is in afib. Was supposed to have DCCV  in June 2019 but indicated she could not afford eliquis and cancelled procedure No history of CAD last normal myovue was 2016    She was seen by Dr. Johnsie Cancel in November and was off anticoagulation for cost issues. It was restarted and she was asked to f/u in afib clinic to set up DCCV. She remains in afib. She states has been on Eliquis 5 mg bid  for at least 3 weeks without interruption, CHA2DS2VASc score is at least 4. . Her flecainide was increased to 75 mg bid at that visit 11/17. She remains in rate controlled afib today. Has chronic LLE, swollen today but this is more her norm. If not tolerable to pt, she will increase lasix x 3 days. She is not usually aware of when she is in afib. Rare alcohol use, no tobacco, mild OSA, not on cpap, sedentary and with BMI of 49.90.  Today, she denies symptoms of palpitations, chest pain, shortness of breath, orthopnea, PND, lower extremity edema, dizziness, presyncope, syncope, or neurologic sequela. The patient is tolerating medications without difficulties and is otherwise without complaint today.   Past Medical History:  Diagnosis Date  . ALLERGIC RHINITIS 08/16/2008  . ANXIETY 08/16/2008  . Arthritis    hands, knees, lower back  . ASTHMA 08/16/2008  . Asthma    BRONCHITIS     DR. Lamonte Sakai    . Bladder leak   . Cancer ([place])    MELANOMA      . Chronic lower back pain    problems with disc L2-5  . COPD 08/16/2008  . DEPRESSION 08/16/2008  . Diabetes mellitus without complication ([place])   . Dysrhythmia    hx AF  . Excessive daytime sleepiness 06/19/2015  . GENITAL HERPES 12/03/2006  . GERD  (gastroesophageal reflux disease)   . H/O hiatal hernia   . HEMATOCHEZIA 06/20/2009  . Hemorrhoids   . History of cardioversion 10/30/14  . History of kidney stones   . HYPERLIPIDEMIA 08/16/2008  . HYPERTENSION 12/03/2006  . Hypertension   . Impaired glucose tolerance 09/21/2010  . Overactive bladder 10/20/2015  . Pneumonia    as a child  . Pulmonary HTN ([place]) 06/19/2015  . Shortness of breath dyspnea   . Sleep apnea    MILD NO CPAP ORDERED  . Snoring 06/19/2015   Past Surgical History:  Procedure Laterality Date  . ABDOMINAL HYSTERECTOMY    . APPENDECTOMY    . CARDIOVERSION N/A 03/20/2014   Procedure: CARDIOVERSION;  Surgeon: Josue Hector, MD;  Location: [facility] ENDOSCOPY;  Service: Cardiovascular;  Laterality: N/A;  . CARDIOVERSION N/A 10/30/2014   Procedure: CARDIOVERSION;  Surgeon: Josue Hector, MD;  Location: [facility] ENDOSCOPY;  Service: Cardiovascular;  Laterality: N/A;  . CARPAL TUNNEL RELEASE     Right + LEFT  . CESAREAN SECTION     x 1   . COLONOSCOPY  2004  . CYST EXCISION     RT ARM   . CYSTOCELE REPAIR N/A 10/25/2012   Procedure: ANTERIOR REPAIR (CYSTOCELE);  Surgeon: Gus Height, MD;  Location: [facility];  Service: Gynecology;  Laterality: N/A;  . HEEL SPUR SURGERY Bilateral   . KNEE SURGERY     Right + LEFT  . LUMBAR LAMINECTOMY/DECOMPRESSION MICRODISCECTOMY N/A 03/19/2016   Procedure: Laminectomy and Foraminotomy - Lumbar three-four - Lumbar four-five;  Surgeon: Eustace Moore, MD;  Location: [place];  Service: Neurosurgery;  Laterality: N/A;  . RADIOLOGY WITH ANESTHESIA Right 11/01/2014   Procedure: MRI RIGHT FOREARM;  Surgeon: Medication Radiologist, MD;  Location: [place];  Service: Radiology;  Laterality: Right;  . RADIOLOGY WITH ANESTHESIA Right 08/29/2015   Procedure: MRI - RIGHT FOREARM;  Surgeon: Medication Radiologist, MD;  Location: [place];  Service: Radiology;  Laterality: Right;  . RADIOLOGY WITH ANESTHESIA N/A 12/05/2015   Procedure: MRI SPINE WITHOUT;  Surgeon: Medication  Radiologist, MD;  Location: [facility];  Service: Radiology;  Laterality: N/A;  . SKIN CANCER EXCISION     BILAT SHOULDERS  . SPLIT NIGHT STUDY  09/04/2015  . TONSILLECTOMY AND ADENOIDECTOMY      Current Outpatient Medications  Medication Sig Dispense Refill  . albuterol (PROVENTIL HFA;VENTOLIN HFA) 108 (90 Base) MCG/ACT inhaler Inhale 2 puffs by mouth every 6 hours as needed for wheezing or shortness of breath. **Yearly physical due in June** 1 Inhaler 11  . amLODipine (NORVASC) 10 MG tablet Take 1 tablet by mouth once daily 90 tablet 3  . apixaban (ELIQUIS) 5 MG TABS tablet Take 1 tablet (5 mg total) by mouth 2 (two) times daily. 60 tablet 5  . Blood Glucose Monitoring Suppl (FREESTYLE LITE) DEVI Use as directed daily E11.9 1 each 0  . Budeson-Glycopyrrol-Formoterol (BREZTRI AEROSPHERE) 160-9-4.8 MCG/ACT AERO Inhale 2 puffs into the lungs 2 (two) times daily. 10.7 g 4  . cetirizine (ZYRTEC) 10 MG tablet Take 10 mg by mouth daily.     . Cholecalciferol (VITAMIN D) 2000 units tablet Take 2,000 Units by mouth daily.    . Cyanocobalamin (VITAMELTS ENERGY VITAMIN B-12) 1500 MCG TBDP Take 1,500 mcg by mouth daily.    Marland Kitchen dimenhyDRINATE (DRAMAMINE) 50 MG tablet Take 50 mg by mouth daily as needed for nausea.    . diphenoxylate-atropine (LOMOTIL) 2.5-0.025 MG tablet Take 1 tablet by mouth 4 (four) times daily as needed for diarrhea or loose stools. 40 tablet 0  . escitalopram (LEXAPRO) 20 MG tablet Take 1 tablet by mouth once daily 90 tablet 3  . estradiol (ESTRACE) 1 MG tablet Take 1 mg by mouth daily.    . flecainide (TAMBOCOR) 50 MG tablet Take 1.5 tablets (75 mg total) by mouth 2 (two) times daily. 270 tablet 3  . fluticasone (FLONASE) 50 MCG/ACT nasal spray Place 2 sprays into both nostrils daily. 16 g 2  . furosemide (LASIX) 40 MG tablet Take 1 tablet by mouth once daily 90 tablet 1  . glucose blood (FREESTYLE LITE) test strip Use as instructed once daily E11.9 100 each 12  . guaiFENesin (MUCINEX)  600 MG 12 hr tablet Take 1 tablet (600 mg total) by mouth 2 (two) times daily as needed for cough or to loosen phlegm. 60 tablet 2  . ipratropium-albuterol (DUONEB) 0.5-2.5 (3) MG/3ML SOLN USE 1 AMPULE IN NEBULIZER 4 TIMES DAILY 360 mL 12  . irbesartan (AVAPRO) 150 MG tablet Take 1 tablet by mouth once daily 90 tablet 0  . Lancets MISC Use as directed once daily E11.9 100 each 11  . levothyroxine (SYNTHROID) 25 MCG tablet Take 1 tablet (25 mcg total) by mouth daily before breakfast. 90 tablet 3  . LORazepam (ATIVAN) 1 MG  tablet Take 1 tablet by mouth twice daily as needed for anxiety 60 tablet 5  . metFORMIN (GLUCOPHAGE) 500 MG tablet Take 1 tablet (500 mg total) by mouth daily with breakfast. 90 tablet 3  . metoprolol tartrate (LOPRESSOR) 50 MG tablet TAKE 1.5 TABLET BY MOUTH 2 TIMES A DAY 270 tablet 3  . nystatin (MYCOSTATIN/NYSTOP) powder Use as directed twice daily as needed 30 g 1  . Omega-3 Fatty Acids (FISH OIL) 1200 MG CAPS Take 1,200 mg by mouth daily.     . ondansetron (ZOFRAN) 4 MG tablet Take 1 tablet (4 mg total) by mouth every 8 (eight) hours as needed for nausea or vomiting. 30 tablet 1  . pantoprazole (PROTONIX) 40 MG tablet Take 1 tablet by mouth once daily 90 tablet 1  . potassium chloride SA (K-DUR,KLOR-CON) 20 MEQ tablet Take 1 tablet (20 mEq total) by mouth daily. 90 tablet 3  . pravastatin (PRAVACHOL) 40 MG tablet Take 1 tablet by mouth once daily 90 tablet 1  . Tetrahydrozoline HCl (VISINE OP) Place 1 drop into both eyes daily as needed (dry eyes).    . tolterodine (DETROL LA) 4 MG 24 hr capsule Take 4 mg by mouth daily.    . vitamin E 400 UNIT capsule Take 400 Units by mouth daily.     No current facility-administered medications for this encounter.     Allergies  Allergen Reactions  . Tramadol Hcl Nausea And Vomiting and Other (See Comments)     mouth dryness, headache  . Codeine Nausea And Vomiting and Nausea Only  . Tape Rash    Plastic tape, bandaids and ekg  leads, causes redness and rash  . Vicodin [Hydrocodone-Acetaminophen] Nausea And Vomiting    Social History   Socioeconomic History  . Marital status: Widowed    Spouse name: Not on file  . Number of children: 1  . Years of education: Not on file  . Highest education level: Not on file  Occupational History  . Occupation: currently unemployed, former [employer]  Social Needs  . Financial resource strain: Not on file  . Food insecurity    Worry: Not on file    Inability: Not on file  . Transportation needs    Medical: Not on file    Non-medical: Not on file  Tobacco Use  . Smoking status: Former Smoker    Packs/day: 1.50    Years: 30.00    Pack years: 45.00    Types: Cigarettes    Quit date: 04/21/2003    Years since quitting: 15.9  . Smokeless tobacco: Never Used  Substance and Sexual Activity  . Alcohol use: Yes    Alcohol/week: 2.0 standard drinks    Types: 2 Standard drinks or equivalent per week    Comment: OCCAS  . Drug use: No  . Sexual activity: Not Currently    Birth control/protection: None  Lifestyle  . Physical activity    Days per week: Not on file    Minutes per session: Not on file  . Stress: Not on file  Relationships  . Social Herbalist on phone: Not on file    Gets together: Not on file    Attends religious service: Not on file    Active member of club or organization: Not on file    Attends meetings of clubs or organizations: Not on file    Relationship status: Not on file  . Intimate partner violence    Fear  of current or ex partner: Not on file    Emotionally abused: Not on file    Physically abused: Not on file    Forced sexual activity: Not on file  Other Topics Concern  . Not on file  Social History Narrative   3 caffeine drinks daily     Family History  Problem Relation Age of Onset  . Diabetes Mother   . Hyperlipidemia Mother   . Hypertension Mother   . Colon polyps Mother   . Stroke Father   .  Colon cancer Neg Hx     ROS- All systems are reviewed and negative except as per the HPI above  Physical Exam: Vitals:   03/28/19 0846  BP: 114/84  Pulse: 98  Weight: 123.7 kg  Height: 5\' 2"  (1.575 m)   Wt Readings from Last 3 Encounters:  03/28/19 123.7 kg  03/07/19 120.7 kg  02/27/19 122.5 kg    Labs: Lab Results  Component Value Date   NA 143 02/09/2019   K 4.4 02/09/2019   CL 104 02/09/2019   CO2 28 02/09/2019   GLUCOSE 154 (H) 02/09/2019   BUN 15 02/09/2019   CREATININE 0.87 02/09/2019   CALCIUM 8.8 02/09/2019   Lab Results  Component Value Date   INR 1.00 03/18/2016   Lab Results  Component Value Date   CHOL 163 03/23/2018   HDL 41.90 03/23/2018   LDLCALC 87 03/23/2018   TRIG 168.0 (H) 03/23/2018     GEN- The patient is well appearing, alert and oriented x 3 today.   Head- normocephalic, atraumatic Eyes-  Sclera clear, conjunctiva pink Ears- hearing intact Oropharynx- clear Neck- supple, no JVP Lymph- no cervical lymphadenopathy Lungs- Clear to ausculation bilaterally, normal work of breathing Heart- irregular rate and rhythm, no murmurs, rubs or gallops, PMI not laterally displaced GI- soft, NT, ND, + BS Extremities- no clubbing, cyanosis, or edema MS- no significant deformity or atrophy Skin- no rash or lesion Psych- euthymic mood, full affect Neuro- strength and sensation are intact  EKG-afib at 98 bpm, qrs 90 ms, qtc 503 ms.   Echo- 10/05/17-Study Conclusions  - Left ventricle: The cavity size was normal. - Aortic valve: Mildly calcified annulus. - Right atrium: The atrium was mildly dilated.  Assessment and Plan: 1. Paroxysmal afib Appears to have been in persistent since at least June? Will plan for DCCV, pt has had DCCV's in the past. Risk vrs benefit explained Continue flecainide 75 mg bid   Continue metoprolol tartrate 50 mg 1 1/2 tabs bid  bmet/mag  2. CHA2DS2VASc score of at least 4  States no missed doses of eliquis 5 mg  bid x at least 3 weeks  Pt brings in a zio patch that was ordered by Dr. Johnsie Cancel 3 weeks ago, will now wait and place on pt at one week post  cardioversion visit   Butch Penny C. Hooria Gasparini, [facility] [address] [place], [place] [address] [phone]

## 2019-03-29 ENCOUNTER — Ambulatory Visit (INDEPENDENT_AMBULATORY_CARE_PROVIDER_SITE_OTHER): Payer: Medicare Other | Admitting: Internal Medicine

## 2019-03-29 ENCOUNTER — Other Ambulatory Visit: Payer: Self-pay | Admitting: Pharmacy Technician

## 2019-03-29 ENCOUNTER — Encounter: Payer: Self-pay | Admitting: Internal Medicine

## 2019-03-29 VITALS — BP 132/86 | HR 104 | Temp 98.0°F | Ht 62.0 in | Wt 274.0 lb

## 2019-03-29 DIAGNOSIS — E119 Type 2 diabetes mellitus without complications: Secondary | ICD-10-CM | POA: Diagnosis not present

## 2019-03-29 DIAGNOSIS — Z0000 Encounter for general adult medical examination without abnormal findings: Secondary | ICD-10-CM

## 2019-03-29 LAB — POCT GLYCOSYLATED HEMOGLOBIN (HGB A1C): Hemoglobin A1C: 6.3 % — AB (ref 4.0–5.6)

## 2019-03-29 NOTE — Assessment & Plan Note (Signed)

## 2019-03-29 NOTE — Progress Notes (Addendum)
Subjective:    Patient ID: Andrea Perry, female    DOB: April 23, 1950, 68 y.o.   MRN: [id number]  HPI  Here for wellness and f/u;  Overall doing ok;  Pt denies Chest pain, worsening SOB, DOE, wheezing, orthopnea, PND, palpitations, dizziness or syncope.  Pt denies neurological change such as new headache, facial or extremity weakness.  Pt denies polydipsia, polyuria, or low sugar symptoms. Pt states overall good compliance with treatment and medications, good tolerability, and has been trying to follow appropriate diet.  Pt denies worsening depressive symptoms, suicidal ideation or panic. No fever, night sweats, wt loss, loss of appetite, or other constitutional symptoms.  Pt states good ability with ADL's, has low fall risk, home safety reviewed and adequate, no other significant changes in hearing or vision, and not active with exercise.  Has gained wt as she has held off in last 2 days taking lasix due to doctors appts.  Plans to take increased lasix tomorrow at bid x 3 days as instructed.  Wt Readings from Last 3 Encounters:  03/29/19 274 lb (124.3 kg)  03/28/19 272 lb 12.8 oz (123.7 kg)  03/07/19 266 lb (120.7 kg)  Sched for cardioversion mon dec 14.  No new complaints Past Medical History:  Diagnosis Date   ALLERGIC RHINITIS 08/16/2008   ANXIETY 08/16/2008   Arthritis    hands, knees, lower back   ASTHMA 08/16/2008   Asthma    BRONCHITIS     DR. Lamonte Sakai     Bladder leak    Cancer ([facility])    MELANOMA       Chronic lower back pain    problems with disc L2-5   COPD 08/16/2008   DEPRESSION 08/16/2008   Diabetes mellitus without complication ([place])    Dysrhythmia    hx AF   Excessive daytime sleepiness 06/19/2015   GENITAL HERPES 12/03/2006   GERD (gastroesophageal reflux disease)    H/O hiatal hernia    HEMATOCHEZIA 06/20/2009   Hemorrhoids    History of cardioversion 10/30/14   History of kidney stones    HYPERLIPIDEMIA 08/16/2008   HYPERTENSION 12/03/2006    Hypertension    Impaired glucose tolerance 09/21/2010   Overactive bladder 10/20/2015   Pneumonia    as a child   Pulmonary HTN ([place]) 06/19/2015   Shortness of breath dyspnea    Sleep apnea    MILD NO CPAP ORDERED   Snoring 06/19/2015   Past Surgical History:  Procedure Laterality Date   ABDOMINAL HYSTERECTOMY     APPENDECTOMY     CARDIOVERSION N/A 03/20/2014   Procedure: CARDIOVERSION;  Surgeon: Josue Hector, MD;  Location: [place];  Service: Cardiovascular;  Laterality: N/A;   CARDIOVERSION N/A 10/30/2014   Procedure: CARDIOVERSION;  Surgeon: Josue Hector, MD;  Location: [facility];  Service: Cardiovascular;  Laterality: N/A;   CARPAL TUNNEL RELEASE     Right + LEFT   CESAREAN SECTION     x 1    COLONOSCOPY  2004   CYST EXCISION     RT ARM    CYSTOCELE REPAIR N/A 10/25/2012   Procedure: ANTERIOR REPAIR (CYSTOCELE);  Surgeon: Gus Height, MD;  Location: [facility];  Service: Gynecology;  Laterality: N/A;   HEEL SPUR SURGERY Bilateral    KNEE SURGERY     Right + LEFT   LUMBAR LAMINECTOMY/DECOMPRESSION MICRODISCECTOMY N/A 03/19/2016   Procedure: Laminectomy and Foraminotomy - Lumbar three-four - Lumbar four-five;  Surgeon: Eustace Moore, MD;  Location: [facility];  Service: Neurosurgery;  Laterality: N/A;   RADIOLOGY WITH ANESTHESIA Right 11/01/2014   Procedure: MRI RIGHT FOREARM;  Surgeon: Medication Radiologist, MD;  Location: [facility];  Service: Radiology;  Laterality: Right;   RADIOLOGY WITH ANESTHESIA Right 08/29/2015   Procedure: MRI - RIGHT FOREARM;  Surgeon: Medication Radiologist, MD;  Location: [facility];  Service: Radiology;  Laterality: Right;   RADIOLOGY WITH ANESTHESIA N/A 12/05/2015   Procedure: MRI SPINE WITHOUT;  Surgeon: Medication Radiologist, MD;  Location: [facility];  Service: Radiology;  Laterality: N/A;   SKIN CANCER EXCISION     BILAT SHOULDERS   SPLIT NIGHT STUDY  09/04/2015   TONSILLECTOMY AND ADENOIDECTOMY      reports that she quit smoking about  15 years ago. Her smoking use included cigarettes. She has a 45.00 pack-year smoking history. She has never used smokeless tobacco. She reports current alcohol use of about 2.0 standard drinks of alcohol per week. She reports that she does not use drugs. family history includes Colon polyps in her mother; Diabetes in her mother; Hyperlipidemia in her mother; Hypertension in her mother; Stroke in her father. Allergies  Allergen Reactions   Tramadol Hcl Nausea And Vomiting and Other (See Comments)     mouth dryness, headache   Codeine Nausea And Vomiting and Nausea Only   Tape Rash    Plastic tape, bandaids and ekg leads, causes redness and rash   Vicodin [Hydrocodone-Acetaminophen] Nausea And Vomiting   Current Outpatient Medications on File Prior to Visit  Medication Sig Dispense Refill   albuterol (PROVENTIL HFA;VENTOLIN HFA) 108 (90 Base) MCG/ACT inhaler Inhale 2 puffs by mouth every 6 hours as needed for wheezing or shortness of breath. **Yearly physical due in June** 1 Inhaler 11   amLODipine (NORVASC) 10 MG tablet Take 1 tablet by mouth once daily 90 tablet 3   apixaban (ELIQUIS) 5 MG TABS tablet Take 1 tablet (5 mg total) by mouth 2 (two) times daily. 60 tablet 5   Blood Glucose Monitoring Suppl (FREESTYLE LITE) DEVI Use as directed daily E11.9 1 each 0   Budeson-Glycopyrrol-Formoterol (BREZTRI AEROSPHERE) 160-9-4.8 MCG/ACT AERO Inhale 2 puffs into the lungs 2 (two) times daily. 10.7 g 4   cetirizine (ZYRTEC) 10 MG tablet Take 10 mg by mouth daily.      Cholecalciferol (VITAMIN D) 2000 units tablet Take 2,000 Units by mouth daily.     Cyanocobalamin (VITAMELTS ENERGY VITAMIN B-12) 1500 MCG TBDP Take 1,500 mcg by mouth daily.     dimenhyDRINATE (DRAMAMINE) 50 MG tablet Take 50 mg by mouth daily as needed for nausea.     diphenoxylate-atropine (LOMOTIL) 2.5-0.025 MG tablet Take 1 tablet by mouth 4 (four) times daily as needed for diarrhea or loose stools. 40 tablet 0    escitalopram (LEXAPRO) 20 MG tablet Take 1 tablet by mouth once daily 90 tablet 3   estradiol (ESTRACE) 1 MG tablet Take 1 mg by mouth daily.     flecainide (TAMBOCOR) 50 MG tablet Take 1.5 tablets (75 mg total) by mouth 2 (two) times daily. 270 tablet 3   fluticasone (FLONASE) 50 MCG/ACT nasal spray Place 2 sprays into both nostrils daily. 16 g 2   furosemide (LASIX) 40 MG tablet Take 1 tablet by mouth once daily 90 tablet 1   glucose blood (FREESTYLE LITE) test strip Use as instructed once daily E11.9 100 each 12   guaiFENesin (MUCINEX) 600 MG 12 hr tablet Take 1 tablet (600 mg total) by mouth 2 (two) times daily as needed  for cough or to loosen phlegm. 60 tablet 2   ipratropium-albuterol (DUONEB) 0.5-2.5 (3) MG/3ML SOLN USE 1 AMPULE IN NEBULIZER 4 TIMES DAILY 360 mL 12   irbesartan (AVAPRO) 150 MG tablet Take 1 tablet by mouth once daily 90 tablet 0   Lancets MISC Use as directed once daily E11.9 100 each 11   levothyroxine (SYNTHROID) 25 MCG tablet Take 1 tablet (25 mcg total) by mouth daily before breakfast. 90 tablet 3   LORazepam (ATIVAN) 1 MG tablet Take 1 tablet by mouth twice daily as needed for anxiety 60 tablet 5   metFORMIN (GLUCOPHAGE) 500 MG tablet Take 1 tablet (500 mg total) by mouth daily with breakfast. 90 tablet 3   metoprolol tartrate (LOPRESSOR) 50 MG tablet TAKE 1.5 TABLET BY MOUTH 2 TIMES A DAY 270 tablet 3   nystatin (MYCOSTATIN/NYSTOP) powder Use as directed twice daily as needed 30 g 1   Omega-3 Fatty Acids (FISH OIL) 1200 MG CAPS Take 1,200 mg by mouth daily.      ondansetron (ZOFRAN) 4 MG tablet Take 1 tablet (4 mg total) by mouth every 8 (eight) hours as needed for nausea or vomiting. 30 tablet 1   pantoprazole (PROTONIX) 40 MG tablet Take 1 tablet by mouth once daily 90 tablet 1   potassium chloride SA (K-DUR,KLOR-CON) 20 MEQ tablet Take 1 tablet (20 mEq total) by mouth daily. 90 tablet 3   pravastatin (PRAVACHOL) 40 MG tablet Take 1 tablet by  mouth once daily 90 tablet 1   Tetrahydrozoline HCl (VISINE OP) Place 1 drop into both eyes daily as needed (dry eyes).     tolterodine (DETROL LA) 4 MG 24 hr capsule Take 4 mg by mouth daily.     vitamin E 400 UNIT capsule Take 400 Units by mouth daily.     No current facility-administered medications on file prior to visit.    Review of Systems Constitutional: Negative for other unusual diaphoresis, sweats, appetite or weight changes HENT: Negative for other worsening hearing loss, ear pain, facial swelling, mouth sores or neck stiffness.   Eyes: Negative for other worsening pain, redness or other visual disturbance.  Respiratory: Negative for other stridor or swelling Cardiovascular: Negative for other palpitations or other chest pain  Gastrointestinal: Negative for worsening diarrhea or loose stools, blood in stool, distention or other pain Genitourinary: Negative for hematuria, flank pain or other change in urine volume.  Musculoskeletal: Negative for myalgias or other joint swelling.  Skin: Negative for other color change, or other wound or worsening drainage.  Neurological: Negative for other syncope or numbness. Hematological: Negative for other adenopathy or swelling Psychiatric/Behavioral: Negative for hallucinations, other worsening agitation, SI, self-injury, or new decreased concentration All otherwise neg per pt     Objective:   Physical Exam BP 132/86    Pulse (!) 104    Temp 98 F (36.7 C) (Oral)    Ht 5\' 2"  (1.575 m)    Wt 274 lb (124.3 kg)    SpO2 92%    BMI 50.12 kg/m  VS noted,  Constitutional: Pt is oriented to person, place, and time. Appears well-developed and well-nourished, in no significant distress and comfortable Head: Normocephalic and atraumatic  Eyes: Conjunctivae and EOM are normal. Pupils are equal, round, and reactive to light Right Ear: External ear normal without discharge Left Ear: External ear normal without discharge Nose: Nose without  discharge or deformity Mouth/Throat: Oropharynx is without other ulcerations and moist  Neck: Normal range of motion. Neck supple.  No JVD present. No tracheal deviation present or significant neck LA or mass Cardiovascular: Normal rate, irregular rhythm, normal heart sounds and intact distal pulses.   Pulmonary/Chest: WOB normal and breath sounds without rales or wheezing  Abdominal: Soft. Bowel sounds are normal. NT. No HSM  Musculoskeletal: Normal range of motion. Exhibits no edema Lymphadenopathy: Has no other cervical adenopathy.  Neurological: Pt is alert and oriented to person, place, and time. Pt has normal reflexes. No cranial nerve deficit. Motor grossly intact, Gait intact Skin: Skin is warm and dry. No rash noted or new ulcerations Psychiatric:  Has normal mood and affect. Behavior is normal without agitation All otherwise neg per pt   Lab Results  Component Value Date   WBC 9.9 03/28/2019   HGB 12.0 03/28/2019   HCT 38.6 03/28/2019   PLT 322 03/28/2019   GLUCOSE 151 (H) 03/28/2019   CHOL 163 03/23/2018   TRIG 168.0 (H) 03/23/2018   HDL 41.90 03/23/2018   LDLDIRECT 141.0 10/16/2015   LDLCALC 87 03/23/2018   ALT 13 02/09/2019   AST 16 02/09/2019   NA 140 03/28/2019   K 4.4 03/28/2019   CL 102 03/28/2019   CREATININE 0.92 03/28/2019   BUN 21 03/28/2019   CO2 25 03/28/2019   TSH 7.12 (H) 03/23/2018   INR 1.00 03/18/2016   HGBA1C 8.1 (A) 09/26/2018   MICROALBUR 96.6 (H) 03/17/2017   POCT HgB A1C Order: [id number] Status:  Final result Visible to patient:  No (scheduled for 04/04/2019 2:14 PM) Dx:  Type 2 diabetes mellitus without comp...  Ref Range & Units 1 d ago  (03/29/19) 6 mo ago  (09/26/18) 1 yr ago  (03/23/18) 1 yr ago  (09/15/17) 2 yr ago  (03/17/17) 2 yr ago  (09/11/16) 2 yr ago  (04/16/16)  Hemoglobin A1C 4.0 - 5.6 % 6.3Abnormal   8.1Abnormal   6.6High  R, CM  6.3 R, CM  6.8High  R, CM  6.0 R  6.2            Assessment & Plan:

## 2019-03-29 NOTE — Patient Outreach (Signed)
[facility]) Care Management  03/29/2019  Andrea Perry 1950-11-19 [id number]    Unsuccessful call placed to patient regarding patient assistance application(s) for Eliquis with [employer] and Breztri with [place] , HIPAA compliant voicemail left.   Was calling patient to inquire if she has received the 2nd set of applications that were mailed to her on 03/21/2019. The deadline for 2020 enrollment is approaching.  Follow up:  Will followup in 2-3 business days if call is not returned.  [name] P. Abdulaziz Toman, [facility] [phone]

## 2019-03-29 NOTE — Assessment & Plan Note (Signed)
stable overall by history and exam, recent data reviewed with pt, and pt to continue medical treatment as before,  to f/u any worsening symptoms or concerns  

## 2019-03-29 NOTE — Patient Instructions (Signed)
Your A1c was ok today  Please continue all other medications as before, and refills have been done if requested.  Please have the pharmacy call with any other refills you may need.  Please continue your efforts at being more active, low cholesterol diet, and weight control.  You are otherwise up to date with prevention measures today.  Please keep your appointments with your specialists as you may have planned  Please return in 6 months, or sooner if needed, with Lab testing done 3-5 days before

## 2019-03-30 ENCOUNTER — Ambulatory Visit: Payer: Medicare Other | Admitting: Sports Medicine

## 2019-03-30 ENCOUNTER — Other Ambulatory Visit (HOSPITAL_COMMUNITY)
Admission: RE | Admit: 2019-03-30 | Discharge: 2019-03-30 | Disposition: A | Discharge status: Home / Self Care | Payer: Medicare Other | Source: Ambulatory Visit | Attending: Cardiology | Admitting: Cardiology

## 2019-03-30 DIAGNOSIS — Z01812 Encounter for preprocedural laboratory examination: Secondary | ICD-10-CM | POA: Diagnosis not present

## 2019-03-30 DIAGNOSIS — Z20828 Contact with and (suspected) exposure to other viral communicable diseases: Secondary | ICD-10-CM | POA: Diagnosis not present

## 2019-03-31 ENCOUNTER — Other Ambulatory Visit: Payer: Self-pay | Admitting: Pharmacy Technician

## 2019-03-31 LAB — NOVEL CORONAVIRUS, NAA (HOSP ORDER, SEND-OUT TO REF LAB; TAT 18-24 HRS): SARS-CoV-2, NAA: NOT DETECTED

## 2019-03-31 NOTE — Patient Outreach (Signed)
[facility]) Care Management  03/31/2019  Andrea Perry [birth year] [id number]   Unsuccessful call placed to patient regarding patient assistance application(s) for Breztri with [place]&ME and Eliquis with BMS , HIPAA compliant voicemail left.   Unfortunately patient did not answer the phone to inquire if she has received the 2nd set of application that we mailed to her. Today was my 2nd call to inquire if she has received the 2nd set of applications.   Follow up:  Will route note to [place] on or after 04/04/2019 as the enrollment period for 2020 consideration will end.  Deaundre Allston P. Brazen Domangue, [place] Management [phone]

## 2019-04-02 ENCOUNTER — Other Ambulatory Visit: Payer: Self-pay | Admitting: Internal Medicine

## 2019-04-03 ENCOUNTER — Ambulatory Visit (HOSPITAL_COMMUNITY)
Admission: RE | Admit: 2019-04-03 | Discharge: 2019-04-03 | Disposition: A | Discharge status: Home / Self Care | Payer: Medicare Other | Attending: Cardiology | Admitting: Cardiology

## 2019-04-03 ENCOUNTER — Ambulatory Visit (HOSPITAL_COMMUNITY): Payer: Medicare Other | Admitting: Anesthesiology

## 2019-04-03 ENCOUNTER — Other Ambulatory Visit: Payer: Self-pay

## 2019-04-03 ENCOUNTER — Encounter (HOSPITAL_COMMUNITY)
Admission: RE | Disposition: A | Discharge status: Home / Self Care | Payer: Self-pay | Source: Home / Self Care | Attending: Cardiology

## 2019-04-03 ENCOUNTER — Encounter (HOSPITAL_COMMUNITY): Payer: Self-pay | Admitting: Cardiology

## 2019-04-03 DIAGNOSIS — K219 Gastro-esophageal reflux disease without esophagitis: Secondary | ICD-10-CM | POA: Diagnosis not present

## 2019-04-03 DIAGNOSIS — Z87891 Personal history of nicotine dependence: Secondary | ICD-10-CM | POA: Insufficient documentation

## 2019-04-03 DIAGNOSIS — I10 Essential (primary) hypertension: Secondary | ICD-10-CM | POA: Insufficient documentation

## 2019-04-03 DIAGNOSIS — Z7901 Long term (current) use of anticoagulants: Secondary | ICD-10-CM | POA: Insufficient documentation

## 2019-04-03 DIAGNOSIS — J449 Chronic obstructive pulmonary disease, unspecified: Secondary | ICD-10-CM | POA: Insufficient documentation

## 2019-04-03 DIAGNOSIS — Z79890 Hormone replacement therapy (postmenopausal): Secondary | ICD-10-CM | POA: Insufficient documentation

## 2019-04-03 DIAGNOSIS — E119 Type 2 diabetes mellitus without complications: Secondary | ICD-10-CM | POA: Diagnosis not present

## 2019-04-03 DIAGNOSIS — I480 Paroxysmal atrial fibrillation: Secondary | ICD-10-CM | POA: Insufficient documentation

## 2019-04-03 DIAGNOSIS — G4730 Sleep apnea, unspecified: Secondary | ICD-10-CM | POA: Diagnosis not present

## 2019-04-03 DIAGNOSIS — Z79899 Other long term (current) drug therapy: Secondary | ICD-10-CM | POA: Diagnosis not present

## 2019-04-03 DIAGNOSIS — I272 Pulmonary hypertension, unspecified: Secondary | ICD-10-CM | POA: Diagnosis not present

## 2019-04-03 DIAGNOSIS — J45909 Unspecified asthma, uncomplicated: Secondary | ICD-10-CM | POA: Diagnosis not present

## 2019-04-03 DIAGNOSIS — E785 Hyperlipidemia, unspecified: Secondary | ICD-10-CM | POA: Diagnosis not present

## 2019-04-03 DIAGNOSIS — J441 Chronic obstructive pulmonary disease with (acute) exacerbation: Secondary | ICD-10-CM | POA: Diagnosis not present

## 2019-04-03 DIAGNOSIS — Z7984 Long term (current) use of oral hypoglycemic drugs: Secondary | ICD-10-CM | POA: Diagnosis not present

## 2019-04-03 DIAGNOSIS — I4891 Unspecified atrial fibrillation: Secondary | ICD-10-CM | POA: Diagnosis not present

## 2019-04-03 HISTORY — PX: CARDIOVERSION: SHX1299

## 2019-04-03 LAB — GLUCOSE, CAPILLARY: Glucose-Capillary: 157 mg/dL — ABNORMAL HIGH (ref 70–99)

## 2019-04-03 SURGERY — CARDIOVERSION
Anesthesia: General

## 2019-04-03 MED ORDER — LIDOCAINE 2% (20 MG/ML) 5 ML SYRINGE
INTRAMUSCULAR | Status: DC | PRN
  Administered 2019-04-03: 100 mg via INTRAVENOUS

## 2019-04-03 MED ORDER — SODIUM CHLORIDE 0.9 % IV SOLN
INTRAVENOUS | Status: DC | PRN
  Administered 2019-04-03: 10:00:00 via INTRAVENOUS

## 2019-04-03 MED ORDER — PROPOFOL 10 MG/ML IV BOLUS
INTRAVENOUS | Status: DC | PRN
  Administered 2019-04-03: 100 mg via INTRAVENOUS

## 2019-04-03 NOTE — Transfer of Care (Signed)
Immediate Anesthesia Transfer of Care Note  Patient: Andrea Perry  Procedure(s) Performed: CARDIOVERSION (N/A )  Patient Location: Endoscopy Unit  Anesthesia Type:General  Level of Consciousness: drowsy and patient cooperative  Airway & Oxygen Therapy: Patient Spontanous Breathing  Post-op Assessment: Report given to RN, Post -op Vital signs reviewed and stable and Patient moving all extremities X 4  Post vital signs: Reviewed and stable  Last Vitals:  Vitals Value Taken Time  BP    Temp    Pulse    Resp    SpO2      Last Pain:  Vitals:   04/03/19 1004  TempSrc: Oral  PainSc: 0-No pain         Complications: No apparent anesthesia complications

## 2019-04-03 NOTE — Anesthesia Preprocedure Evaluation (Signed)
Anesthesia Evaluation  Patient identified by MRN, date of birth, ID band Patient awake    Reviewed: Allergy & Precautions, H&P , NPO status , Patient's Chart, lab work & pertinent test results  Airway Mallampati: II  TM Distance: <3 FB Neck ROM: Full    Dental no notable dental hx. (+) Teeth Intact, Dental Advisory Given   Pulmonary shortness of breath, asthma , former smoker,    Pulmonary exam normal breath sounds clear to auscultation       Cardiovascular hypertension, On Medications negative cardio ROS  + dysrhythmias Atrial Fibrillation  Rhythm:Irregular Rate:Normal     Neuro/Psych PSYCHIATRIC DISORDERS Anxiety Depression negative neurological ROS     GI/Hepatic Neg liver ROS, hiatal hernia, GERD  Medicated and Controlled,  Endo/Other  negative endocrine ROSdiabetesHypothyroidism Morbid obesity  Renal/GU negative Renal ROS  negative genitourinary   Musculoskeletal   Abdominal   Peds  Hematology negative hematology ROS (+)   Anesthesia Other Findings   Reproductive/Obstetrics negative OB ROS                             Anesthesia Physical  Anesthesia Plan  ASA: III  Anesthesia Plan: General   Post-op Pain Management:    Induction: Intravenous  PONV Risk Score and Plan: Treatment may vary due to age or medical condition  Airway Management Planned: Mask  Additional Equipment:   Intra-op Plan:   Post-operative Plan: Extubation in OR  Informed Consent: I have reviewed the patients History and Physical, chart, labs and discussed the procedure including the risks, benefits and alternatives for the proposed anesthesia with the patient or authorized representative who has indicated his/her understanding and acceptance.     Dental advisory given  Plan Discussed with: CRNA and Anesthesiologist  Anesthesia Plan Comments:         Anesthesia Quick Evaluation

## 2019-04-03 NOTE — Interval H&P Note (Signed)
History and Physical Interval Note:  04/03/2019 9:48 AM  Andrea Perry  has presented today for surgery, with the diagnosis of AFIB.  The various methods of treatment have been discussed with the patient and family. After consideration of risks, benefits and other options for treatment, the patient has consented to  Procedure(s): CARDIOVERSION (N/A) as a surgical intervention.  The patient's history has been reviewed, patient examined, no change in status, stable for surgery.  I have reviewed the patient's chart and labs.  Questions were answered to the patient's satisfaction.     [name]

## 2019-04-03 NOTE — Discharge Instructions (Signed)

## 2019-04-03 NOTE — CV Procedure (Signed)
    Cardioversion Note  Andrea Perry [id number] 15-Apr-1951  Procedure: DC Cardioversion Indications: atrial fibrillation  Procedure Details Consent: Obtained Time Out: Verified patient identification, verified procedure, site/side was marked, verified correct patient position, special equipment/implants available, Radiology Safety Procedures followed,  medications/allergies/relevent history reviewed, required imaging and test results available.  Performed  The patient has been on adequate anticoagulation.  The patient received IV propofol administered by anesthesia staff for deep sedation.  Synchronous cardioversion was performed at 120 joules.  The cardioversion was successful.   Complications: No apparent complications Patient did tolerate procedure well.   Andrea Dawley, MD, [facility] 04/03/2019, 10:38 AM

## 2019-04-03 NOTE — Anesthesia Postprocedure Evaluation (Signed)
Anesthesia Post Note  Patient: Andrea Perry  Procedure(s) Performed: CARDIOVERSION (N/A )     Patient location during evaluation: PACU Anesthesia Type: General Level of consciousness: sedated Pain management: pain level controlled Vital Signs Assessment: post-procedure vital signs reviewed and stable Respiratory status: spontaneous breathing and respiratory function stable Cardiovascular status: stable Postop Assessment: no apparent nausea or vomiting Anesthetic complications: no    Last Vitals:  Vitals:   04/03/19 1050 04/03/19 1100  BP: 111/60 (!) 108/49  Pulse: (!) 43 (!) 46  Resp: 11 14  Temp:    SpO2: 98% 98%    Last Pain:  Vitals:   04/03/19 1100  TempSrc:   PainSc: 0-No pain                 Lyon Dumont DANIEL

## 2019-04-04 ENCOUNTER — Other Ambulatory Visit: Payer: Self-pay | Admitting: Pharmacist

## 2019-04-04 ENCOUNTER — Telehealth (HOSPITAL_COMMUNITY): Payer: Self-pay | Admitting: Nurse Practitioner

## 2019-04-04 NOTE — Telephone Encounter (Signed)
I called pt as I was reviewing her DCCV notes from  yesterday and noted HR's in the 40's when she returned to [place]. Her intervals looked to be  in range with the addition of flecainide. I spoke to  pt and she feels ok. She  has no means to check her HR/BP. I asked her to reduce her metoprolol from 1/1/2 tabs bid  to 1 tab bid. She has f/u in the office next Monday.

## 2019-04-04 NOTE — Patient Outreach (Signed)
[facility]) Care Management  04/04/2019  Andrea Perry Andrea Perry December 19, 1950 [id number]   Patient was called regarding medication assistance. HIPAA identifiers were obtained. Patient reported feeling "ok" today. She underwent cardioversion yesterday.      Patient is a 68 year old female with multiple medical conditions including but not limited to:  Allergic rhinitis, anxiety, A fib, COPD, depression, hypertension,  GERD, hypothyroidism, pulmonary hypertension, and osteoarthritis of knees.  Patient said her metoprolol dose was decreased from 75mg  daily to 50 mg daily by her provider today.  Medications Reviewed Today    Reviewed by Elayne Guerin, [facility] (Pharmacist) on 04/04/19 at [place] List Status: <None>  Medication Order Taking? Sig Documenting Provider Last Dose Status Informant  albuterol (PROVENTIL HFA;VENTOLIN HFA) 108 (90 Base) MCG/ACT inhaler [id number] Yes Inhale 2 puffs by mouth every 6 hours as needed for wheezing or shortness of breath. **Yearly physical due in June** Byrum, Rose Fillers, MD Taking Active            Med Note Luana Shu, NATASHA   Tue Mar 07, 2019  9:16 AM)    amLODipine (NORVASC) 10 MG tablet [id number] Yes Take 1 tablet by mouth once daily Josue Hector, MD Taking Active   apixaban (ELIQUIS) 5 MG TABS tablet [id number] Yes Take 1 tablet (5 mg total) by mouth 2 (two) times daily. Jerline Pain, MD Taking Active   Blood Glucose Monitoring Suppl ([address] [place]) DEVI [id number] Yes Use as directed daily E11.9 Biagio Borg, MD Taking Active   Budeson-Glycopyrrol-Formoterol [facility] AEROSPHERE) 160-9-4.8 MCG/ACT Hollie Salk [id number] Yes Inhale 2 puffs into the lungs 2 (two) times daily. Lauraine Rinne, NP Taking Active   cetirizine (ZYRTEC) 10 MG tablet [id number] Yes Take 10 mg by mouth daily.  [provider] Taking Active Self  Cholecalciferol (VITAMIN D) 2000 units tablet [id number] Yes Take 2,000 Units by mouth daily. [provider] Taking Active Self   Cyanocobalamin (VITAMELTS ENERGY VITAMIN B-12) 1500 MCG TBDP [id number] Yes Take 1,500 mcg by mouth daily. [provider] Taking Active Self  dimenhyDRINATE (DRAMAMINE) 50 MG tablet [id number] Yes Take 50 mg by mouth daily as needed for nausea. [provider] Taking Active Self           Med Note Luana Shu, NATASHA   Tue Mar 07, 2019  9:16 AM)    diphenoxylate-atropine (LOMOTIL) 2.5-0.025 MG tablet [id number] Yes Take 1 tablet by mouth 4 (four) times daily as needed for diarrhea or loose stools. Biagio Borg, MD Taking Active            Med Note Jacinta Shoe   Tue Mar 07, 2019  9:16 AM)    escitalopram (LEXAPRO) 20 MG tablet [id number] Yes Take 1 tablet by mouth once daily Biagio Borg, MD Taking Active   estradiol (ESTRACE) 1 MG tablet [id number] Yes Take 1 mg by mouth daily. [provider] Taking Active   flecainide (TAMBOCOR) 50 MG tablet [id number] Yes Take 1.5 tablets (75 mg total) by mouth 2 (two) times daily. Josue Hector, MD Taking Active   fluticasone [facility]) 50 MCG/ACT nasal spray [id number] Yes Place 2 sprays into both nostrils daily. Collene Gobble, MD Taking Active            Med Note Ellwood Handler   Fri Feb 24, 2019 10:54 AM) Uses ~2x per week  furosemide (LASIX) 40 MG tablet [id number] Yes Take 1 tablet by mouth once daily Biagio Borg, MD Taking  Active   glucose blood (FREESTYLE LITE) test strip [id number] Yes Use as instructed once daily E11.9 Biagio Borg, MD Taking Active   guaiFENesin (MUCINEX) 600 MG 12 hr tablet [id number] Yes Take 1 tablet (600 mg total) by mouth 2 (two) times daily as needed for cough or to loosen phlegm. Biagio Borg, MD Taking Active            Med Note Lovena Le, [place] K   Fri Feb 24, 2019 10:56 AM) Does not take frequently  ipratropium-albuterol (DUONEB) 0.5-2.5 (3) MG/3ML SOLN [id number] Yes USE 1 AMPULE IN NEBULIZER 4 TIMES DAILY Collene Gobble, MD Taking Active   irbesartan (AVAPRO) 150 MG tablet [id number] Yes Take 1  tablet by mouth once daily Biagio Borg, MD Taking Active   Lancets MISC [id number] Yes Use as directed once daily E11.9 Biagio Borg, MD Taking Active   levothyroxine (SYNTHROID) 25 MCG tablet [id number] Yes Take 1 tablet (25 mcg total) by mouth daily before breakfast. Biagio Borg, MD Taking Active   LORazepam (ATIVAN) 1 MG tablet [id number] Yes Take 1 tablet by mouth twice daily as needed for anxiety Biagio Borg, MD Taking Active   metFORMIN (GLUCOPHAGE) 500 MG tablet [id number] Yes Take 1 tablet (500 mg total) by mouth daily with breakfast. Biagio Borg, MD Taking Active   metoprolol tartrate (LOPRESSOR) 50 MG tablet [id number] Yes TAKE 1.5 TABLET BY MOUTH 2 TIMES A DAY  Patient taking differently: Take 50 mg by mouth 2 (two) times daily.    Josue Hector, MD Taking Active   nystatin (MYCOSTATIN/NYSTOP) powder [id number] Yes Use as directed twice daily as needed Biagio Borg, MD Taking Active   Omega-3 Fatty Acids (FISH OIL) 1200 MG CAPS [id number] Yes Take 1,200 mg by mouth daily.  [provider] Taking Active Self  ondansetron (ZOFRAN) 4 MG tablet [id number] Yes Take 1 tablet (4 mg total) by mouth every 8 (eight) hours as needed for nausea or vomiting. Biagio Borg, MD Taking Active   pantoprazole (PROTONIX) 40 MG tablet [id number] Yes Take 1 tablet by mouth once daily Biagio Borg, MD Taking Active   potassium chloride SA (K-DUR,KLOR-CON) 20 MEQ tablet [id number] Yes Take 1 tablet (20 mEq total) by mouth daily. Biagio Borg, MD Taking Active   pravastatin (PRAVACHOL) 40 MG tablet [id number] Yes Take 1 tablet by mouth once daily Biagio Borg, MD Taking Active   Tetrahydrozoline HCl [facility] OP) [id number] Yes Place 1 drop into both eyes daily as needed (dry eyes). [provider] Taking Active Self           Med Note Ellwood Handler   Fri Feb 24, 2019 10:58 AM) Uses about 3x per day  tolterodine (DETROL LA) 4 MG 24 hr capsule [id number] Yes Take 4 mg by mouth daily. [provider] Taking Active   vitamin E 400 UNIT capsule [id number] Yes Take 400 Units by mouth daily. [provider] Taking Active Self          Plan: Patient said she received the applications sent to her. She said she will mail them back with a copy of her [place].  Route note to [employer] as FYI.  Follow up with patient in 8-10 weeks.  Elayne Guerin, PharmD, [place] Clinical Pharmacist [phone]

## 2019-04-06 ENCOUNTER — Ambulatory Visit: Payer: Medicare Other | Admitting: Sports Medicine

## 2019-04-07 ENCOUNTER — Other Ambulatory Visit: Payer: Self-pay | Admitting: *Deleted

## 2019-04-10 ENCOUNTER — Encounter (HOSPITAL_COMMUNITY): Payer: Self-pay | Admitting: Nurse Practitioner

## 2019-04-10 ENCOUNTER — Other Ambulatory Visit: Payer: Self-pay

## 2019-04-10 ENCOUNTER — Ambulatory Visit (HOSPITAL_COMMUNITY)
Admission: RE | Admit: 2019-04-10 | Discharge: 2019-04-10 | Disposition: A | Discharge status: Home / Self Care | Payer: Medicare Other | Source: Ambulatory Visit | Attending: Nurse Practitioner | Admitting: Nurse Practitioner

## 2019-04-10 VITALS — BP 122/76 | HR 88 | Ht 62.0 in | Wt 272.4 lb

## 2019-04-10 DIAGNOSIS — D6869 Other thrombophilia: Secondary | ICD-10-CM | POA: Diagnosis not present

## 2019-04-10 DIAGNOSIS — G8929 Other chronic pain: Secondary | ICD-10-CM | POA: Insufficient documentation

## 2019-04-10 DIAGNOSIS — F329 Major depressive disorder, single episode, unspecified: Secondary | ICD-10-CM | POA: Insufficient documentation

## 2019-04-10 DIAGNOSIS — Z823 Family history of stroke: Secondary | ICD-10-CM | POA: Insufficient documentation

## 2019-04-10 DIAGNOSIS — Z7901 Long term (current) use of anticoagulants: Secondary | ICD-10-CM | POA: Insufficient documentation

## 2019-04-10 DIAGNOSIS — Z8249 Family history of ischemic heart disease and other diseases of the circulatory system: Secondary | ICD-10-CM | POA: Diagnosis not present

## 2019-04-10 DIAGNOSIS — Z886 Allergy status to analgesic agent status: Secondary | ICD-10-CM | POA: Insufficient documentation

## 2019-04-10 DIAGNOSIS — Z888 Allergy status to other drugs, medicaments and biological substances status: Secondary | ICD-10-CM | POA: Insufficient documentation

## 2019-04-10 DIAGNOSIS — Z90710 Acquired absence of both cervix and uterus: Secondary | ICD-10-CM | POA: Diagnosis not present

## 2019-04-10 DIAGNOSIS — Z833 Family history of diabetes mellitus: Secondary | ICD-10-CM | POA: Insufficient documentation

## 2019-04-10 DIAGNOSIS — F419 Anxiety disorder, unspecified: Secondary | ICD-10-CM | POA: Insufficient documentation

## 2019-04-10 DIAGNOSIS — Z79890 Hormone replacement therapy (postmenopausal): Secondary | ICD-10-CM | POA: Insufficient documentation

## 2019-04-10 DIAGNOSIS — I484 Atypical atrial flutter: Secondary | ICD-10-CM

## 2019-04-10 DIAGNOSIS — E119 Type 2 diabetes mellitus without complications: Secondary | ICD-10-CM | POA: Insufficient documentation

## 2019-04-10 DIAGNOSIS — K219 Gastro-esophageal reflux disease without esophagitis: Secondary | ICD-10-CM | POA: Diagnosis not present

## 2019-04-10 DIAGNOSIS — Z87891 Personal history of nicotine dependence: Secondary | ICD-10-CM | POA: Diagnosis not present

## 2019-04-10 DIAGNOSIS — Z85820 Personal history of malignant melanoma of skin: Secondary | ICD-10-CM | POA: Insufficient documentation

## 2019-04-10 DIAGNOSIS — E785 Hyperlipidemia, unspecified: Secondary | ICD-10-CM | POA: Insufficient documentation

## 2019-04-10 DIAGNOSIS — Z7984 Long term (current) use of oral hypoglycemic drugs: Secondary | ICD-10-CM | POA: Insufficient documentation

## 2019-04-10 DIAGNOSIS — G4730 Sleep apnea, unspecified: Secondary | ICD-10-CM | POA: Diagnosis not present

## 2019-04-10 DIAGNOSIS — I272 Pulmonary hypertension, unspecified: Secondary | ICD-10-CM | POA: Insufficient documentation

## 2019-04-10 DIAGNOSIS — I480 Paroxysmal atrial fibrillation: Secondary | ICD-10-CM | POA: Insufficient documentation

## 2019-04-10 DIAGNOSIS — J449 Chronic obstructive pulmonary disease, unspecified: Secondary | ICD-10-CM | POA: Diagnosis not present

## 2019-04-10 DIAGNOSIS — Z8349 Family history of other endocrine, nutritional and metabolic diseases: Secondary | ICD-10-CM | POA: Insufficient documentation

## 2019-04-10 DIAGNOSIS — N3281 Overactive bladder: Secondary | ICD-10-CM | POA: Diagnosis not present

## 2019-04-10 DIAGNOSIS — Z79899 Other long term (current) drug therapy: Secondary | ICD-10-CM | POA: Diagnosis not present

## 2019-04-10 DIAGNOSIS — Z885 Allergy status to narcotic agent status: Secondary | ICD-10-CM | POA: Diagnosis not present

## 2019-04-10 DIAGNOSIS — I10 Essential (primary) hypertension: Secondary | ICD-10-CM | POA: Insufficient documentation

## 2019-04-10 DIAGNOSIS — M1389 Other specified arthritis, multiple sites: Secondary | ICD-10-CM | POA: Insufficient documentation

## 2019-04-10 NOTE — Progress Notes (Signed)
Primary Care Physician: Biagio Borg, MD Referring Physician: Dr. Rae Lips is a 68 y.o. female  diastolic dysfunction,prior tobacco use, COPD, adult asthma,  DM, HTN, HLD. Started on Glucophage by Dr Jenny Reichmann in June A1c was elevated over 8.1  She has had long  standing PAF and tends  to retain fluid when she is in afib. Was supposed to have DCCV  in June 2019 but indicated she could not afford eliquis and cancelled procedure No history of CAD last normal myovue was 2016    She was seen by Dr. Johnsie Cancel in November and was off anticoagulation for cost issues. It was restarted and she was asked to f/u in afib clinic to set up DCCV. She remains in afib. She states has been on Eliquis 5 mg bid  for at least 3 weeks without interruption, CHA2DS2VASc score is at least 4. . Her flecainide was increased to 75 mg bid at that visit 11/17. She remains in rate controlled afib today. Has chronic LLE, swollen today but this is more her norm. If not tolerable to pt, she will increase lasix x 3 days. She is not usually aware of when she is in afib. Rare alcohol use, no tobacco, mild OSA, not on cpap, sedentary and with BMI of 49.90.  F/u in afib clinic, 12/ 21. She had successful cardioversion, 12/14 but had bradycardia in the low 40's and BB was cut back. SHe is niow in atrail flutter in the 80's, states that she has felt  better though.  Today, she denies symptoms of palpitations, chest pain,+ chronic  shortness of breath, - orthopnea, PND, lower extremity edema, dizziness, presyncope, syncope, or neurologic sequela. The patient is tolerating medications without difficulties and is otherwise without complaint today.   Past Medical History:  Diagnosis Date  . ALLERGIC RHINITIS 08/16/2008  . ANXIETY 08/16/2008  . Arthritis    hands, knees, lower back  . ASTHMA 08/16/2008  . Asthma    BRONCHITIS     DR. Lamonte Sakai    . Bladder leak   . Cancer ([place])    MELANOMA      . Chronic lower back pain    problems  with disc L2-5  . COPD 08/16/2008  . DEPRESSION 08/16/2008  . Diabetes mellitus without complication ([place])   . Dysrhythmia    hx AF  . Excessive daytime sleepiness 06/19/2015  . GENITAL HERPES 12/03/2006  . GERD (gastroesophageal reflux disease)   . H/O hiatal hernia   . HEMATOCHEZIA 06/20/2009  . Hemorrhoids   . History of cardioversion 10/30/14  . History of kidney stones   . HYPERLIPIDEMIA 08/16/2008  . HYPERTENSION 12/03/2006  . Hypertension   . Impaired glucose tolerance 09/21/2010  . Overactive bladder 10/20/2015  . Pneumonia    as a child  . Pulmonary HTN ([place]) 06/19/2015  . Shortness of breath dyspnea   . Sleep apnea    MILD NO CPAP ORDERED  . Snoring 06/19/2015   Past Surgical History:  Procedure Laterality Date  . ABDOMINAL HYSTERECTOMY    . APPENDECTOMY    . CARDIOVERSION N/A 03/20/2014   Procedure: CARDIOVERSION;  Surgeon: Josue Hector, MD;  Location: [facility] ENDOSCOPY;  Service: Cardiovascular;  Laterality: N/A;  . CARDIOVERSION N/A 10/30/2014   Procedure: CARDIOVERSION;  Surgeon: Josue Hector, MD;  Location[facility] ENDOSCOPY;  Service: Cardiovascular;  Laterality: N/A;  . CARDIOVERSION N/A 04/03/2019   Procedure: CARDIOVERSION;  Surgeon: Dorothy Spark, MD;  Location: [place];  Service: Cardiovascular;  Laterality: N/A;  . CARPAL TUNNEL RELEASE     Right + LEFT  . CESAREAN SECTION     x 1   . COLONOSCOPY  2004  . CYST EXCISION     RT ARM   . CYSTOCELE REPAIR N/A 10/25/2012   Procedure: ANTERIOR REPAIR (CYSTOCELE);  Surgeon: Gus Height, MD;  Location: [facility];  Service: Gynecology;  Laterality: N/A;  . HEEL SPUR SURGERY Bilateral   . KNEE SURGERY     Right + LEFT  . LUMBAR LAMINECTOMY/DECOMPRESSION MICRODISCECTOMY N/A 03/19/2016   Procedure: Laminectomy and Foraminotomy - Lumbar three-four - Lumbar four-five;  Surgeon: Eustace Moore, MD;  Location: [place];  Service: Neurosurgery;  Laterality: N/A;  . RADIOLOGY WITH ANESTHESIA Right 11/01/2014   Procedure: MRI RIGHT  FOREARM;  Surgeon: Medication Radiologist, MD;  Location: [place];  Service: Radiology;  Laterality: Right;  . RADIOLOGY WITH ANESTHESIA Right 08/29/2015   Procedure: MRI - RIGHT FOREARM;  Surgeon: Medication Radiologist, MD;  Location: [place];  Service: Radiology;  Laterality: Right;  . RADIOLOGY WITH ANESTHESIA N/A 12/05/2015   Procedure: MRI SPINE WITHOUT;  Surgeon: Medication Radiologist, MD;  Location: [place];  Service: Radiology;  Laterality: N/A;  . SKIN CANCER EXCISION     BILAT SHOULDERS  . SPLIT NIGHT STUDY  09/04/2015  . TONSILLECTOMY AND ADENOIDECTOMY      Current Outpatient Medications  Medication Sig Dispense Refill  . albuterol (PROVENTIL HFA;VENTOLIN HFA) 108 (90 Base) MCG/ACT inhaler Inhale 2 puffs by mouth every 6 hours as needed for wheezing or shortness of breath. **Yearly physical due in June** 1 Inhaler 11  . amLODipine (NORVASC) 10 MG tablet Take 1 tablet by mouth once daily 90 tablet 3  . apixaban (ELIQUIS) 5 MG TABS tablet Take 1 tablet (5 mg total) by mouth 2 (two) times daily. 60 tablet 5  . Blood Glucose Monitoring Suppl (FREESTYLE LITE) DEVI Use as directed daily E11.9 1 each 0  . Budeson-Glycopyrrol-Formoterol (BREZTRI AEROSPHERE) 160-9-4.8 MCG/ACT AERO Inhale 2 puffs into the lungs 2 (two) times daily. 10.7 g 4  . cetirizine (ZYRTEC) 10 MG tablet Take 10 mg by mouth daily.     . Cholecalciferol (VITAMIN D) 2000 units tablet Take 2,000 Units by mouth daily.    . Cyanocobalamin (VITAMELTS ENERGY VITAMIN B-12) 1500 MCG TBDP Take 1,500 mcg by mouth daily.    Marland Kitchen dimenhyDRINATE (DRAMAMINE) 50 MG tablet Take 50 mg by mouth daily as needed for nausea.    . diphenoxylate-atropine (LOMOTIL) 2.5-0.025 MG tablet Take 1 tablet by mouth 4 (four) times daily as needed for diarrhea or loose stools. 40 tablet 0  . escitalopram (LEXAPRO) 20 MG tablet Take 1 tablet by mouth once daily 90 tablet 3  . estradiol (ESTRACE) 1 MG tablet Take 1 mg by mouth daily.    . flecainide (TAMBOCOR) 50  MG tablet Take 1.5 tablets (75 mg total) by mouth 2 (two) times daily. 270 tablet 3  . furosemide (LASIX) 40 MG tablet Take 1 tablet by mouth once daily 90 tablet 1  . glucose blood (FREESTYLE LITE) test strip Use as instructed once daily E11.9 100 each 12  . guaiFENesin (MUCINEX) 600 MG 12 hr tablet Take 1 tablet (600 mg total) by mouth 2 (two) times daily as needed for cough or to loosen phlegm. 60 tablet 2  . ipratropium-albuterol (DUONEB) 0.5-2.5 (3) MG/3ML SOLN USE 1 AMPULE IN NEBULIZER 4 TIMES DAILY 360 mL 12  . irbesartan (AVAPRO) 150 MG  tablet Take 1 tablet by mouth once daily 90 tablet 0  . Lancets MISC Use as directed once daily E11.9 100 each 11  . levothyroxine (SYNTHROID) 25 MCG tablet Take 1 tablet (25 mcg total) by mouth daily before breakfast. 90 tablet 3  . LORazepam (ATIVAN) 1 MG tablet Take 1 tablet by mouth twice daily as needed for anxiety 60 tablet 5  . metFORMIN (GLUCOPHAGE) 500 MG tablet Take 1 tablet (500 mg total) by mouth daily with breakfast. 90 tablet 3  . metoprolol tartrate (LOPRESSOR) 50 MG tablet TAKE 1.5 TABLET BY MOUTH 2 TIMES A DAY (Patient taking differently: Take 50 mg by mouth 2 (two) times daily. ) 270 tablet 3  . nystatin (MYCOSTATIN/NYSTOP) powder Use as directed twice daily as needed 30 g 1  . Omega-3 Fatty Acids (FISH OIL) 1200 MG CAPS Take 1,200 mg by mouth daily.     . ondansetron (ZOFRAN) 4 MG tablet Take 1 tablet (4 mg total) by mouth every 8 (eight) hours as needed for nausea or vomiting. 30 tablet 1  . pantoprazole (PROTONIX) 40 MG tablet Take 1 tablet by mouth once daily 90 tablet 1  . potassium chloride SA (K-DUR,KLOR-CON) 20 MEQ tablet Take 1 tablet (20 mEq total) by mouth daily. 90 tablet 3  . pravastatin (PRAVACHOL) 40 MG tablet Take 1 tablet by mouth once daily 90 tablet 1  . Tetrahydrozoline HCl (VISINE OP) Place 1 drop into both eyes daily as needed (dry eyes).    . tolterodine (DETROL LA) 4 MG 24 hr capsule Take 4 mg by mouth daily.    .  vitamin E 400 UNIT capsule Take 400 Units by mouth daily.     No current facility-administered medications for this encounter.    Allergies  Allergen Reactions  . Tramadol Hcl Nausea And Vomiting and Other (See Comments)     mouth dryness, headache  . Codeine Nausea And Vomiting and Nausea Only  . Tape Rash    Plastic tape, bandaids and ekg leads, causes redness and rash  . Vicodin [Hydrocodone-Acetaminophen] Nausea And Vomiting    Social History   Socioeconomic History  . Marital status: Widowed    Spouse name: Not on file  . Number of children: 1  . Years of education: Not on file  . Highest education level: Not on file  Occupational History  . Occupation: currently unemployed, former [employer]  Tobacco Use  . Smoking status: Former Smoker    Packs/day: 1.50    Years: 30.00    Pack years: 45.00    Types: Cigarettes    Quit date: 04/21/2003    Years since quitting: 15.9  . Smokeless tobacco: Never Used  Substance and Sexual Activity  . Alcohol use: Yes    Alcohol/week: 2.0 standard drinks    Types: 2 Standard drinks or equivalent per week    Comment: OCCAS  . Drug use: No  . Sexual activity: Not Currently    Birth control/protection: None  Other Topics Concern  . Not on file  Social History Narrative   3 caffeine drinks daily    Social Determinants of Health   Financial Resource Strain:   . Difficulty of Paying Living Expenses: Not on file  Food Insecurity:   . Worried About Charity fundraiser in the Last Year: Not on file  . Ran Out of Food in the Last Year: Not on file  Transportation Needs:   . Lack of Transportation (Medical): Not on file  .  Lack of Transportation (Non-Medical): Not on file  Physical Activity:   . Days of Exercise per Week: Not on file  . Minutes of Exercise per Session: Not on file  Stress:   . Feeling of Stress : Not on file  Social Connections:   . Frequency of Communication with Friends and Family: Not on file  .  Frequency of Social Gatherings with Friends and Family: Not on file  . Attends Religious Services: Not on file  . Active Member of Clubs or Organizations: Not on file  . Attends Archivist Meetings: Not on file  . Marital Status: Not on file  Intimate Partner Violence:   . Fear of Current or Ex-Partner: Not on file  . Emotionally Abused: Not on file  . Physically Abused: Not on file  . Sexually Abused: Not on file    Family History  Problem Relation Age of Onset  . Diabetes Mother   . Hyperlipidemia Mother   . Hypertension Mother   . Colon polyps Mother   . Stroke Father   . Colon cancer Neg Hx     ROS- All systems are reviewed and negative except as per the HPI above  Physical Exam: Vitals:   04/10/19 0839  BP: 122/76  Pulse: 88  Weight: 123.6 kg  Height: 5\' 2"  (1.575 m)   Wt Readings from Last 3 Encounters:  04/10/19 123.6 kg  03/29/19 124.3 kg  03/28/19 123.7 kg    Labs: Lab Results  Component Value Date   NA 140 03/28/2019   K 4.4 03/28/2019   CL 102 03/28/2019   CO2 25 03/28/2019   GLUCOSE 151 (H) 03/28/2019   BUN 21 03/28/2019   CREATININE 0.92 03/28/2019   CALCIUM 8.6 (L) 03/28/2019   Lab Results  Component Value Date   INR 1.00 03/18/2016   Lab Results  Component Value Date   CHOL 163 03/23/2018   HDL 41.90 03/23/2018   LDLCALC 87 03/23/2018   TRIG 168.0 (H) 03/23/2018     GEN- The patient is well appearing, alert and oriented x 3 today.   Head- normocephalic, atraumatic Eyes-  Sclera clear, conjunctiva pink Ears- hearing intact Oropharynx- clear Neck- supple, no JVP Lymph- no cervical lymphadenopathy Lungs- Clear to ausculation bilaterally, normal work of breathing Heart- irregular rate and rhythm, no murmurs, rubs or gallops, PMI not laterally displaced GI- soft, NT, ND, + BS Extremities- no clubbing, cyanosis, or edema MS- no significant deformity or atrophy Skin- no rash or lesion Psych- euthymic mood, full  affect Neuro- strength and sensation are intact  EKG-afib at 88 bpm, qrs 92 ms, qtc 496 ms.   Echo- 10/05/17-Study Conclusions  - Left ventricle: The cavity size was normal. - Aortic valve: Mildly calcified annulus. - Right atrium: The atrium was mildly dilated.  Assessment and Plan: 1. Paroxysmal afib Appears to have been in persistent since at least June? Successful  DCCV, but ERAF Pt feels improved after cardioversion I may have just caught her out of rhythm this am Rate controlled atrial flutter  Continue flecainide 75 mg bid   Continue metoprolol tartrate 50 mg 1 tab  bid   2. CHA2DS2VASc score of at least 4  Continue eliquis 5 mg bid    Bring back after Christmas and will discuss options of change in rhythm control vrs ablation  Butch Penny C. Shantil Vallejo, [facility] [address] [place], [place] [address] [phone]

## 2019-04-18 ENCOUNTER — Other Ambulatory Visit: Payer: Self-pay | Admitting: Pharmacy Technician

## 2019-04-18 NOTE — Patient Outreach (Signed)
[facility]) Care Management  04/18/2019  Andrea Perry 09-06-1950 [id number]    Patient missed the deadline for applying for patient assistance for the 2020 calendar year for [place] for [facility] and BMS for Eliquis.  Per previous documentation from [place], patient is aware of the missed deadline.  Will route note to [place] that patient assistance case is being closed and will remove myself from care team.  Andrea Perry. Andrea Perry, [place] Management [phone]

## 2019-04-19 ENCOUNTER — Other Ambulatory Visit: Payer: Self-pay | Admitting: Internal Medicine

## 2019-04-19 ENCOUNTER — Encounter (HOSPITAL_COMMUNITY): Payer: Self-pay

## 2019-04-19 ENCOUNTER — Ambulatory Visit (HOSPITAL_COMMUNITY): Payer: Medicare Other | Admitting: Nurse Practitioner

## 2019-04-19 DIAGNOSIS — Z01419 Encounter for gynecological examination (general) (routine) without abnormal findings: Secondary | ICD-10-CM | POA: Diagnosis not present

## 2019-04-19 DIAGNOSIS — R87622 Low grade squamous intraepithelial lesion on cytologic smear of vagina (LGSIL): Secondary | ICD-10-CM | POA: Diagnosis not present

## 2019-04-20 ENCOUNTER — Other Ambulatory Visit: Payer: Self-pay | Admitting: *Deleted

## 2019-04-20 NOTE — Patient Outreach (Signed)
[facility]) Care Management  04/20/2019  Lyasia North Zhong 04-04-1951 [id number]   RN Health Coach attempted follow up outreach call to patient.  Patient was unavailable. HIPPA compliance voicemail message left with return callback number.  Plan: RN will call patient again within 30 days.  [place] Care Management [phone]

## 2019-04-26 DIAGNOSIS — Z1231 Encounter for screening mammogram for malignant neoplasm of breast: Secondary | ICD-10-CM | POA: Diagnosis not present

## 2019-05-04 ENCOUNTER — Ambulatory Visit (INDEPENDENT_AMBULATORY_CARE_PROVIDER_SITE_OTHER): Payer: Medicare Other | Admitting: Sports Medicine

## 2019-05-04 ENCOUNTER — Other Ambulatory Visit: Payer: Self-pay

## 2019-05-04 VITALS — BP 125/76 | Ht 62.0 in | Wt 265.0 lb

## 2019-05-04 DIAGNOSIS — M170 Bilateral primary osteoarthritis of knee: Secondary | ICD-10-CM | POA: Diagnosis not present

## 2019-05-04 MED ORDER — METHYLPREDNISOLONE ACETATE 40 MG/ML IJ SUSP
40.0000 mg | Freq: Once | INTRAMUSCULAR | Status: AC
  Administered 2019-05-04: 40 mg via INTRA_ARTICULAR

## 2019-05-04 NOTE — Progress Notes (Addendum)
   Subjective:    Patient ID: Andrea Perry, female    DOB: 1950/06/18, 69 y.o.   MRN: [id number]  HPI chief complaint: Bilateral knee pain  Patient is a pleasant 69 year old female who comes in today requesting repeat cortisone injections into each knee.  She has a well-documented history of bilateral knee DJD.  Last injections were administered in October 2018.  They worked quite well up until recently.  Pain has begun to improve.  She describes a general achiness diffuse throughout the knees.  Symptoms are identical in nature to what she has experienced previously with her arthritis.  Interim medical history reviewed Medications reviewed Allergies reviewed    Review of Systems    As above Objective:   Physical Exam  Well-developed, well-nourished.  No acute distress.  Awake alert and oriented x3.  Vital signs reviewed  Examination of both knees shows range of motion from 0 to 120 degrees.  1+ boggy synovitis.  She does have 2+ pitting edema around the knee as well.  No erythema.  Knees are grossly stable ligamentous exam.  Neurovascularly intact distally.      Assessment & Plan:   Returning bilateral knee pain secondary to DJD  Each of her knees were injected today with cortisone.  An anterior lateral approach was utilized for the left knee and an anterior medial approach utilized for the right knee..  She tolerates this without difficulty.  She is not an ideal surgical candidate based on her medical comorbidities and her BMI.  I have also recommended that she try topical Voltaren.  Follow-up with me as needed.  Consent obtained and verified. Time-out conducted. Noted no overlying erythema, induration, or other signs of local infection. Skin prepped in a sterile fashion. Topical analgesic spray: Ethyl chloride. Joint: right knee Needle: 25g 1.5 inch Completed without difficulty. Meds: 3cc 1% xylocaine, 1cc (40mg ) depomedrol  Advised to call if fevers/chills, erythema,  induration, drainage, or persistent bleeding.  Consent obtained and verified. Time-out conducted. Noted no overlying erythema, induration, or other signs of local infection. Skin prepped in a sterile fashion. Topical analgesic spray: Ethyl chloride. Joint: left knee Needle: 25g 1.5 inch Completed without difficulty. Meds: 3cc 1% xylocaine, 1cc (40mg ) depomderol  Advised to call if fevers/chills, erythema, induration, drainage, or persistent bleeding.

## 2019-05-04 NOTE — Patient Instructions (Signed)
It was nice to see you today,  We injected both knees with steroid injection for your pain.  If you notice any swelling/redness/pain on the skin near the knees or if you develop fevers over the next day or 2 please visit a medical provider to get checked for infection.  The topical gel we were discussing that you can rub on your knees is called Voltaren gel.  It is available over-the-counter.  You can use it up to 4 times a day on each knee.   Have a great day

## 2019-05-06 ENCOUNTER — Other Ambulatory Visit: Payer: Self-pay | Admitting: Internal Medicine

## 2019-05-06 NOTE — Telephone Encounter (Signed)
Please refill as per office routine med refill policy (all routine meds refilled for 3 mo or monthly per pt preference up to one year from last visit, then month to month grace period for 3 mo, then further med refills will have to be denied)  

## 2019-05-08 ENCOUNTER — Other Ambulatory Visit: Payer: Self-pay | Admitting: *Deleted

## 2019-05-08 NOTE — Patient Outreach (Signed)
[facility]) Care Management  05/08/2019  Andrea Perry 1950/06/24 [id number]  RN Health Coach attempted follow up outreach call to patient.  Patient stated she was busy at this time and wanted to know if she could be called back tomorrow. Plan: RN will call patient again   [place] Care Management [phone]

## 2019-05-10 ENCOUNTER — Other Ambulatory Visit: Payer: Self-pay | Admitting: *Deleted

## 2019-05-10 NOTE — Patient Outreach (Signed)
[facility]) Care Management  05/10/2019  Andrea Perry [place] 1950-11-19 [id number]   RN Health Coach attempted follow up outreach call to patient.  Patient was unavailable. HIPPA compliance voicemail message left with return callback number.  Plan: RN will call patient again within 30 days.  [place] Care Management [phone]

## 2019-05-16 ENCOUNTER — Ambulatory Visit: Payer: Medicare Other | Admitting: Emergency Medicine

## 2019-05-16 ENCOUNTER — Other Ambulatory Visit: Payer: Self-pay

## 2019-05-16 ENCOUNTER — Encounter: Payer: Self-pay | Admitting: Emergency Medicine

## 2019-05-16 VITALS — BP 124/82 | HR 104 | Temp 97.2°F | Ht 62.0 in | Wt 278.8 lb

## 2019-05-16 DIAGNOSIS — G4733 Obstructive sleep apnea (adult) (pediatric): Secondary | ICD-10-CM | POA: Diagnosis not present

## 2019-05-16 DIAGNOSIS — J438 Other emphysema: Secondary | ICD-10-CM

## 2019-05-16 DIAGNOSIS — J301 Allergic rhinitis due to pollen: Secondary | ICD-10-CM | POA: Diagnosis not present

## 2019-05-16 MED ORDER — ALBUTEROL SULFATE HFA 108 (90 BASE) MCG/ACT IN AERS: INHALATION_SPRAY | RESPIRATORY_TRACT | 5 refills | Status: DC

## 2019-05-16 NOTE — Assessment & Plan Note (Signed)
Tolerating Breztri, has also been using DuoNeb on a schedule.  We talked about dropping the DuoNeb to as needed, continue her maintenance inhaler twice daily as ordered.  She is going to try this.  Please continue Breztri twice a day as you have been taking it.  Rinse and gargle after using. Try changing your DuoNeb to use this only as needed through the day.  If you find that you miss it you can go back to using it on a schedule IV times a day. Keep your albuterol available use 2 puffs if needed for shortness of breath. You would benefit from the COVID-19 vaccine.  Please consider arranging this

## 2019-05-16 NOTE — Assessment & Plan Note (Signed)
Increased congestion, nasal obstruction but her drainage appears to be well treated.

## 2019-05-16 NOTE — Assessment & Plan Note (Signed)
She snores heavily and believe that she is high suspicion for obstructive sleep apnea.  We will plan to repeat her split-night PSG.  She is willing to do this and to be treated if her AHI has increased compared with 2017.

## 2019-05-16 NOTE — Patient Instructions (Signed)
Please continue Breztri twice a day as you have been taking it.  Rinse and gargle after using. Try changing your DuoNeb to use this only as needed through the day.  If you find that you miss it you can go back to using it on a schedule IV times a day. Keep your albuterol available use 2 puffs if needed for shortness of breath. You would benefit from the COVID-19 vaccine.  Please consider arranging this We will repeat your split-night sleep study to see if you qualify for CPAP therapy for your obstructive sleep apnea. Continue your other medications including her diuretics as directed by cardiology. Follow with Dr. Lamonte Sakai in 2 months or sooner if you have any problems.

## 2019-05-16 NOTE — Progress Notes (Signed)
Subjective:    Patient ID: Andrea Perry, female    DOB: 21-Jan-1951, 69 y.o.   MRN: [id number]  HPI  ROV 05/16/2019 --69 year old former smoker with history of childhood asthma and now with COPD with asthmatic component and mild peripheral eosinophilia, OSA (untreated), allergic rhinitis.  Also followed by cardiology for atrial fibrillation, hypertension with some secondary pulmonary hypertension.  She has had some difficulty getting her medications due to cost, the donut hole in her insurance coverage.  She has been managed on Breztri. DuoNeb on a schedule 4x a day. Her A Fib has been labile - underwent DCCV in December but did not stay in NSR, being considered for possible ablation.   She notices that she is having coughing when she eats or drinks, ? Some occult aspiration. She has GERD, seems to be controlled on PPI. She has nasal congestion, gtt is fairly well managed on zyrtec. She likes the Juliette, benefits from this. Last flare was last year some time.   She has not decide yet whether she is going to get the COVID shot. She has never had anaphylaxis or a rxn to vaccine of any kind. I recommended that she receive  She snores heavily, has some interrupted sleep.  PSG with sleep apnea but on the mild side and we deferred treatment.  She is willing to go back and have this repeated, consider CPAP if indicated.  Review of Systems  Constitutional: Negative for fever and unexpected weight change.  HENT: Negative for congestion, dental problem, ear pain, nosebleeds, postnasal drip, rhinorrhea, sinus pressure, sneezing, sore throat and trouble swallowing.   Eyes: Negative for redness and itching.  Respiratory: Positive for cough and shortness of breath. Negative for chest tightness and wheezing.   Cardiovascular: Negative for palpitations and leg swelling.  Gastrointestinal: Negative for nausea and vomiting.  Genitourinary: Negative for dysuria.  Musculoskeletal: Negative for joint swelling.   Skin: Negative for rash.  Neurological: Negative for headaches.  Hematological: Does not bruise/bleed easily.  Psychiatric/Behavioral: Negative for dysphoric mood. The patient is not nervous/anxious.        Objective:   Physical Exam Vitals:   05/16/19 0915  BP: 124/82  Pulse: (!) 104  Temp: (!) 97.2 F (36.2 C)  TempSrc: Temporal  SpO2: 99%  Weight: 278 lb 12.8 oz (126.5 kg)  Height: 5\' 2"  (1.575 m)   Gen: Pleasant, overwt,  in no distress,  normal affect  ENT: No lesions,  mouth clear,  oropharynx clear, no postnasal drip  Neck: No JVD, no stridor  Lungs: No use of accessory muscles, clear, distant at both bases  Cardiovascular: RRR, heart sounds normal, no murmur or gallops, no peripheral edema  Musculoskeletal: No deformities, no cyanosis or clubbing  Neuro: alert, non focal  Skin: Warm, no lesions or rashes      Assessment & Plan:  COPD (chronic obstructive pulmonary disease) Tolerating Breztri, has also been using DuoNeb on a schedule.  We talked about dropping the DuoNeb to as needed, continue her maintenance inhaler twice daily as ordered.  She is going to try this.  Please continue Breztri twice a day as you have been taking it.  Rinse and gargle after using. Try changing your DuoNeb to use this only as needed through the day.  If you find that you miss it you can go back to using it on a schedule IV times a day. Keep your albuterol available use 2 puffs if needed for shortness of breath. You would benefit  from the COVID-19 vaccine.  Please consider arranging this  Mild obstructive sleep apnea She snores heavily and believe that she is high suspicion for obstructive sleep apnea.  We will plan to repeat her split-night PSG.  She is willing to do this and to be treated if her AHI has increased compared with 2017.  Allergic rhinitis Increased congestion, nasal obstruction but her drainage appears to be well treated.  Baltazar Apo, MD, PhD 05/16/2019, 2:37  PM [facility] [phone] or if no answer [phone]

## 2019-05-17 ENCOUNTER — Encounter: Payer: Self-pay | Admitting: *Deleted

## 2019-05-17 ENCOUNTER — Ambulatory Visit (HOSPITAL_COMMUNITY)
Admission: RE | Admit: 2019-05-17 | Discharge: 2019-05-17 | Disposition: A | Discharge status: Home / Self Care | Payer: Medicare Other | Source: Ambulatory Visit | Attending: Nurse Practitioner | Admitting: Nurse Practitioner

## 2019-05-17 ENCOUNTER — Encounter (HOSPITAL_COMMUNITY): Payer: Self-pay | Admitting: Nurse Practitioner

## 2019-05-17 VITALS — BP 118/86 | HR 112 | Ht 62.0 in | Wt 278.4 lb

## 2019-05-17 DIAGNOSIS — Z8249 Family history of ischemic heart disease and other diseases of the circulatory system: Secondary | ICD-10-CM | POA: Diagnosis not present

## 2019-05-17 DIAGNOSIS — K219 Gastro-esophageal reflux disease without esophagitis: Secondary | ICD-10-CM | POA: Diagnosis not present

## 2019-05-17 DIAGNOSIS — I480 Paroxysmal atrial fibrillation: Secondary | ICD-10-CM | POA: Diagnosis not present

## 2019-05-17 DIAGNOSIS — Z87891 Personal history of nicotine dependence: Secondary | ICD-10-CM | POA: Diagnosis not present

## 2019-05-17 DIAGNOSIS — Z79899 Other long term (current) drug therapy: Secondary | ICD-10-CM | POA: Diagnosis not present

## 2019-05-17 DIAGNOSIS — E785 Hyperlipidemia, unspecified: Secondary | ICD-10-CM | POA: Insufficient documentation

## 2019-05-17 DIAGNOSIS — D6869 Other thrombophilia: Secondary | ICD-10-CM

## 2019-05-17 DIAGNOSIS — Z7901 Long term (current) use of anticoagulants: Secondary | ICD-10-CM | POA: Insufficient documentation

## 2019-05-17 DIAGNOSIS — Z7984 Long term (current) use of oral hypoglycemic drugs: Secondary | ICD-10-CM | POA: Diagnosis not present

## 2019-05-17 DIAGNOSIS — J449 Chronic obstructive pulmonary disease, unspecified: Secondary | ICD-10-CM | POA: Diagnosis not present

## 2019-05-17 DIAGNOSIS — F419 Anxiety disorder, unspecified: Secondary | ICD-10-CM | POA: Insufficient documentation

## 2019-05-17 DIAGNOSIS — F329 Major depressive disorder, single episode, unspecified: Secondary | ICD-10-CM | POA: Diagnosis not present

## 2019-05-17 DIAGNOSIS — I4892 Unspecified atrial flutter: Secondary | ICD-10-CM | POA: Insufficient documentation

## 2019-05-17 DIAGNOSIS — I484 Atypical atrial flutter: Secondary | ICD-10-CM | POA: Diagnosis not present

## 2019-05-17 DIAGNOSIS — I5189 Other ill-defined heart diseases: Secondary | ICD-10-CM | POA: Insufficient documentation

## 2019-05-17 DIAGNOSIS — I4819 Other persistent atrial fibrillation: Secondary | ICD-10-CM

## 2019-05-17 DIAGNOSIS — E119 Type 2 diabetes mellitus without complications: Secondary | ICD-10-CM | POA: Insufficient documentation

## 2019-05-17 MED ORDER — METOPROLOL TARTRATE 50 MG PO TABS: 75.0000 mg | ORAL_TABLET | Freq: Two times a day (BID) | ORAL | 3 refills | Status: DC

## 2019-05-17 NOTE — Patient Instructions (Signed)
Increase metoprolol to 75mg  twice a day (1 and 1/2 tabs of your 50mg  tab twice a day)

## 2019-05-17 NOTE — Progress Notes (Signed)
Primary Care Physician: Biagio Borg, MD Referring Physician: Dr. Rae Perry is a 69 y.o. female  diastolic dysfunction,prior tobacco use, COPD, adult asthma,  DM, HTN, HLD. Started on Glucophage by Dr Jenny Reichmann in June A1c was elevated over 8.1  She has had long  standing PAF and tends  to retain fluid when she is in afib. Was supposed to have DCCV  in June 2019 but indicated she could not afford eliquis and cancelled procedure No history of CAD last normal myovue was 2016    She was seen by Dr. Johnsie Cancel in November 2020 and was off anticoagulation for above  cost issues. It was restarted and she was asked to f/u in afib clinic to set up DCCV. She remained in afib. She states has been on Eliquis 5 mg bid  for at least 3 weeks without interruption, CHA2DS2VASc score   at least 4. . Her flecainide was increased to 75 mg bid at that visit 11/17. She remained in rate controlled afib . Has chronic LLE.. If not tolerable to pt, she will increase lasix x 3 days. She is not usually aware of when she is in afib. Rare alcohol use, no tobacco, mild OSA, not on cpap, sedentary and with BMI of 49.90.  F/u in afib clinic, 12/ 21. She had successful cardioversion, 12/14 but had bradycardia in the low 40's and BB was cut back. SHe is niow in atrail flutter in the 80's, states that she has felt  better though.  F/u in afib clinic, 05/17/19. She  remains in atrial  flutter with fast v rate at 112 bpm.  She cannot tell when she is in afib or not. Chronic LLE, has not  taken her diuretics today. She does remain  on flecainide and eliquis 5 mg bid.   Today, she denies symptoms of palpitations, chest pain,+ chronic  shortness of breath, - orthopnea, PND, lower extremity edema, dizziness, presyncope, syncope, or neurologic sequela. The patient is tolerating medications without difficulties and is otherwise without complaint today.   Past Medical History:  Diagnosis Date  . ALLERGIC RHINITIS 08/16/2008  .  ANXIETY 08/16/2008  . Arthritis    hands, knees, lower back  . ASTHMA 08/16/2008  . Asthma    BRONCHITIS     DR. Lamonte Sakai    . Bladder leak   . Cancer ([place])    MELANOMA      . Chronic lower back pain    problems with disc L2-5  . COPD 08/16/2008  . DEPRESSION 08/16/2008  . Diabetes mellitus without complication ([place])   . Dysrhythmia    hx AF  . Excessive daytime sleepiness 06/19/2015  . GENITAL HERPES 12/03/2006  . GERD (gastroesophageal reflux disease)   . H/O hiatal hernia   . HEMATOCHEZIA 06/20/2009  . Hemorrhoids   . History of cardioversion 10/30/14  . History of kidney stones   . HYPERLIPIDEMIA 08/16/2008  . HYPERTENSION 12/03/2006  . Hypertension   . Impaired glucose tolerance 09/21/2010  . Overactive bladder 10/20/2015  . Pneumonia    as a child  . Pulmonary HTN ([place]) 06/19/2015  . Shortness of breath dyspnea   . Sleep apnea    MILD NO CPAP ORDERED  . Snoring 06/19/2015   Past Surgical History:  Procedure Laterality Date  . ABDOMINAL HYSTERECTOMY    . APPENDECTOMY    . CARDIOVERSION N/A 03/20/2014   Procedure: CARDIOVERSION;  Surgeon: Josue Hector, MD;  Location: [facility];  Service:  Cardiovascular;  Laterality: N/A;  . CARDIOVERSION N/A 10/30/2014   Procedure: CARDIOVERSION;  Surgeon: Josue Hector, MD;  Location: [facility] ENDOSCOPY;  Service: Cardiovascular;  Laterality: N/A;  . CARDIOVERSION N/A 04/03/2019   Procedure: CARDIOVERSION;  Surgeon: Dorothy Spark, MD;  Location: [facility] ENDOSCOPY;  Service: Cardiovascular;  Laterality: N/A;  . CARPAL TUNNEL RELEASE     Right + LEFT  . CESAREAN SECTION     x 1   . COLONOSCOPY  2004  . CYST EXCISION     RT ARM   . CYSTOCELE REPAIR N/A 10/25/2012   Procedure: ANTERIOR REPAIR (CYSTOCELE);  Surgeon: Gus Height, MD;  Location: [facility];  Service: Gynecology;  Laterality: N/A;  . HEEL SPUR SURGERY Bilateral   . KNEE SURGERY     Right + LEFT  . LUMBAR LAMINECTOMY/DECOMPRESSION MICRODISCECTOMY N/A 03/19/2016   Procedure: Laminectomy and  Foraminotomy - Lumbar three-four - Lumbar four-five;  Surgeon: Eustace Moore, MD;  Location: [facility];  Service: Neurosurgery;  Laterality: N/A;  . RADIOLOGY WITH ANESTHESIA Right 11/01/2014   Procedure: MRI RIGHT FOREARM;  Surgeon: Medication Radiologist, MD;  Location: [facility];  Service: Radiology;  Laterality: Right;  . RADIOLOGY WITH ANESTHESIA Right 08/29/2015   Procedure: MRI - RIGHT FOREARM;  Surgeon: Medication Radiologist, MD;  Location: [facility];  Service: Radiology;  Laterality: Right;  . RADIOLOGY WITH ANESTHESIA N/A 12/05/2015   Procedure: MRI SPINE WITHOUT;  Surgeon: Medication Radiologist, MD;  Location: [facility];  Service: Radiology;  Laterality: N/A;  . SKIN CANCER EXCISION     BILAT SHOULDERS  . SPLIT NIGHT STUDY  09/04/2015  . TONSILLECTOMY AND ADENOIDECTOMY      Current Outpatient Medications  Medication Sig Dispense Refill  . albuterol (VENTOLIN HFA) 108 (90 Base) MCG/ACT inhaler Inhale 2 puffs by mouth every 6 hours as needed for wheezing or shortness of breath. 18 g 5  . amLODipine (NORVASC) 10 MG tablet Take 1 tablet by mouth once daily 90 tablet 3  . apixaban (ELIQUIS) 5 MG TABS tablet Take 1 tablet (5 mg total) by mouth 2 (two) times daily. 60 tablet 5  . Blood Glucose Monitoring Suppl (FREESTYLE LITE) DEVI Use as directed daily E11.9 1 each 0  . Budeson-Glycopyrrol-Formoterol (BREZTRI AEROSPHERE) 160-9-4.8 MCG/ACT AERO Inhale 2 puffs into the lungs 2 (two) times daily. 10.7 g 4  . cetirizine (ZYRTEC) 10 MG tablet Take 10 mg by mouth daily.     . Cholecalciferol (VITAMIN D) 2000 units tablet Take 2,000 Units by mouth daily.    Marland Kitchen dimenhyDRINATE (DRAMAMINE) 50 MG tablet Take 50 mg by mouth daily as needed for nausea.    . diphenoxylate-atropine (LOMOTIL) 2.5-0.025 MG tablet Take 1 tablet by mouth 4 (four) times daily as needed for diarrhea or loose stools. 40 tablet 0  . escitalopram (LEXAPRO) 20 MG tablet Take 1 tablet by mouth once daily 90 tablet 3  . estradiol (ESTRACE) 1 MG  tablet Take 1 mg by mouth daily.    . flecainide (TAMBOCOR) 50 MG tablet Take 1.5 tablets (75 mg total) by mouth 2 (two) times daily. 270 tablet 3  . furosemide (LASIX) 40 MG tablet Take 1 tablet by mouth once daily 90 tablet 1  . glucose blood (FREESTYLE LITE) test strip Use as instructed once daily E11.9 100 each 12  . guaiFENesin (MUCINEX) 600 MG 12 hr tablet Take 1 tablet (600 mg total) by mouth 2 (two) times daily as needed for cough or to loosen phlegm. 60 tablet 2  .  ipratropium-albuterol (DUONEB) 0.5-2.5 (3) MG/3ML SOLN USE 1 AMPULE IN NEBULIZER 4 TIMES DAILY 360 mL 12  . irbesartan (AVAPRO) 150 MG tablet Take 1 tablet by mouth once daily 90 tablet 0  . Lancets MISC Use as directed once daily E11.9 100 each 11  . levothyroxine (SYNTHROID) 25 MCG tablet Take 1 tablet (25 mcg total) by mouth daily before breakfast. 90 tablet 3  . LORazepam (ATIVAN) 1 MG tablet Take 1 tablet by mouth twice daily as needed for anxiety 60 tablet 5  . metFORMIN (GLUCOPHAGE) 500 MG tablet Take 1 tablet (500 mg total) by mouth daily with breakfast. 90 tablet 3  . metoprolol tartrate (LOPRESSOR) 50 MG tablet Take 1.5 tablets (75 mg total) by mouth 2 (two) times daily. 270 tablet 3  . nystatin (MYCOSTATIN/NYSTOP) powder Use as directed twice daily as needed 30 g 1  . Omega-3 Fatty Acids (FISH OIL) 1200 MG CAPS Take 1,200 mg by mouth daily.     . ondansetron (ZOFRAN) 4 MG tablet Take 1 tablet (4 mg total) by mouth every 8 (eight) hours as needed for nausea or vomiting. 30 tablet 1  . pantoprazole (PROTONIX) 40 MG tablet Take 1 tablet by mouth once daily 90 tablet 1  . potassium chloride SA (K-DUR,KLOR-CON) 20 MEQ tablet Take 1 tablet (20 mEq total) by mouth daily. 90 tablet 3  . pravastatin (PRAVACHOL) 40 MG tablet Take 1 tablet by mouth once daily 90 tablet 0  . Tetrahydrozoline HCl (VISINE OP) Place 1 drop into both eyes daily as needed (dry eyes).    . tolterodine (DETROL LA) 4 MG 24 hr capsule Take 4 mg by mouth  daily.    . vitamin E 400 UNIT capsule Take 400 Units by mouth daily.     No current facility-administered medications for this encounter.    Allergies  Allergen Reactions  . Tramadol Hcl Nausea And Vomiting and Other (See Comments)     mouth dryness, headache  . Codeine Nausea And Vomiting and Nausea Only  . Tape Rash    Plastic tape, bandaids and ekg leads, causes redness and rash  . Vicodin [Hydrocodone-Acetaminophen] Nausea And Vomiting    Social History   Socioeconomic History  . Marital status: Widowed    Spouse name: Not on file  . Number of children: 1  . Years of education: Not on file  . Highest education level: Not on file  Occupational History  . Occupation: currently unemployed, former [employer]  Tobacco Use  . Smoking status: Former Smoker    Packs/day: 1.50    Years: 30.00    Pack years: 45.00    Types: Cigarettes    Quit date: 04/21/2003    Years since quitting: 16.0  . Smokeless tobacco: Never Used  Substance and Sexual Activity  . Alcohol use: Yes    Alcohol/week: 2.0 standard drinks    Types: 2 Standard drinks or equivalent per week    Comment: OCCAS  . Drug use: No  . Sexual activity: Not Currently    Birth control/protection: None  Other Topics Concern  . Not on file  Social History Narrative   3 caffeine drinks daily    Social Determinants of Health   Financial Resource Strain:   . Difficulty of Paying Living Expenses: Not on file  Food Insecurity:   . Worried About Charity fundraiser in the Last Year: Not on file  . Ran Out of Food in the Last Year: Not on file  Transportation Needs:   . Film/video editor (Medical): Not on file  . Lack of Transportation (Non-Medical): Not on file  Physical Activity:   . Days of Exercise per Week: Not on file  . Minutes of Exercise per Session: Not on file  Stress:   . Feeling of Stress : Not on file  Social Connections:   . Frequency of Communication with Friends and Family:  Not on file  . Frequency of Social Gatherings with Friends and Family: Not on file  . Attends Religious Services: Not on file  . Active Member of Clubs or Organizations: Not on file  . Attends Archivist Meetings: Not on file  . Marital Status: Not on file  Intimate Partner Violence:   . Fear of Current or Ex-Partner: Not on file  . Emotionally Abused: Not on file  . Physically Abused: Not on file  . Sexually Abused: Not on file    Family History  Problem Relation Age of Onset  . Diabetes Mother   . Hyperlipidemia Mother   . Hypertension Mother   . Colon polyps Mother   . Stroke Father   . Colon cancer Neg Hx     ROS- All systems are reviewed and negative except as per the HPI above  Physical Exam: Vitals:   05/17/19 1421  BP: 118/86  Pulse: (!) 112  Weight: 126.3 kg  Height: 5\' 2"  (1.575 m)   Wt Readings from Last 3 Encounters:  05/17/19 126.3 kg  05/16/19 126.5 kg  05/04/19 120.2 kg    Labs: Lab Results  Component Value Date   NA 140 03/28/2019   K 4.4 03/28/2019   CL 102 03/28/2019   CO2 25 03/28/2019   GLUCOSE 151 (H) 03/28/2019   BUN 21 03/28/2019   CREATININE 0.92 03/28/2019   CALCIUM 8.6 (L) 03/28/2019   Lab Results  Component Value Date   INR 1.00 03/18/2016   Lab Results  Component Value Date   CHOL 163 03/23/2018   HDL 41.90 03/23/2018   LDLCALC 87 03/23/2018   TRIG 168.0 (H) 03/23/2018     GEN- The patient is well appearing, alert and oriented x 3 today.   Head- normocephalic, atraumatic Eyes-  Sclera clear, conjunctiva pink Ears- hearing intact Oropharynx- clear Neck- supple, no JVP Lymph- no cervical lymphadenopathy Lungs- Clear to ausculation bilaterally, normal work of breathing Heart- irregular rate and rhythm, no murmurs, rubs or gallops, PMI not laterally displaced GI- soft, NT, ND, + BS Extremities- no clubbing, cyanosis, or edema MS- no significant deformity or atrophy Skin- no rash or lesion Psych- euthymic  mood, full affect Neuro- strength and sensation are intact  EKG-afib at 88 bpm, qrs 92 ms, qtc 496 ms.   Echo- 10/05/17-Study Conclusions  - Left ventricle: The cavity size was normal. - Aortic valve: Mildly calcified annulus. - Right atrium: The atrium was mildly dilated.  Assessment and Plan: 1. Paroxysmal afib Appears to have been in persistent since at least June  Successful  DCCV, but ERAF Pt states she feels no different in afib/flutter vrs SR Continue flecainide 75 mg bid   Continue metoprolol tartrate 50 mg but inctrease to 1 1/2 tab  bid for better rate control We discussed ablution vrs Tikosyn but pt states that she would rather just stay out of rhythm as it does not seem to bother her Will update echo to assess for TMC  2. CHA2DS2VASc score of at least 4  Continue eliquis 5 mg bid  Will see after increase of BB and echo to further discuss   Andrea Perry, [facility] [address] [place], [place] [address] [phone]

## 2019-05-17 NOTE — Telephone Encounter (Signed)
This encounter was created in error - please disregard.

## 2019-05-18 ENCOUNTER — Other Ambulatory Visit: Payer: Self-pay | Admitting: *Deleted

## 2019-05-18 NOTE — Patient Outreach (Signed)
[facility]) Care Management  05/18/2019  Andrea Perry 12-23-1950 [id number]  RN Health Coach  #4 attempted follow up outreach call to patient.  Patient was unavailable. HIPPA compliance voicemail message left with return callback number.  Plan: RN will send unsuccessful outreach letter If no response within 10 business days. RN will close case  [facility] Management [phone]

## 2019-05-24 ENCOUNTER — Ambulatory Visit (HOSPITAL_COMMUNITY)
Admission: RE | Admit: 2019-05-24 | Discharge: 2019-05-24 | Disposition: A | Discharge status: Home / Self Care | Payer: Medicare Other | Source: Ambulatory Visit | Attending: Internal Medicine | Admitting: Internal Medicine

## 2019-05-24 ENCOUNTER — Other Ambulatory Visit: Payer: Self-pay

## 2019-05-24 DIAGNOSIS — I4819 Other persistent atrial fibrillation: Secondary | ICD-10-CM | POA: Diagnosis not present

## 2019-05-24 DIAGNOSIS — E119 Type 2 diabetes mellitus without complications: Secondary | ICD-10-CM | POA: Diagnosis not present

## 2019-05-24 DIAGNOSIS — I10 Essential (primary) hypertension: Secondary | ICD-10-CM | POA: Diagnosis not present

## 2019-05-24 DIAGNOSIS — J449 Chronic obstructive pulmonary disease, unspecified: Secondary | ICD-10-CM | POA: Diagnosis not present

## 2019-05-24 DIAGNOSIS — I272 Pulmonary hypertension, unspecified: Secondary | ICD-10-CM | POA: Insufficient documentation

## 2019-05-24 DIAGNOSIS — Z87891 Personal history of nicotine dependence: Secondary | ICD-10-CM | POA: Diagnosis not present

## 2019-05-24 NOTE — Progress Notes (Signed)
  Echocardiogram 2D Echocardiogram has been performed.  Burnett Kanaris 05/24/2019, 2:56 PM

## 2019-05-25 ENCOUNTER — Other Ambulatory Visit: Payer: Self-pay | Admitting: Internal Medicine

## 2019-05-25 NOTE — Telephone Encounter (Signed)
Please refill as per office routine med refill policy (all routine meds refilled for 3 mo or monthly per pt preference up to one year from last visit, then month to month grace period for 3 mo, then further med refills will have to be denied)  

## 2019-05-27 ENCOUNTER — Other Ambulatory Visit (HOSPITAL_COMMUNITY)
Admission: RE | Admit: 2019-05-27 | Discharge: 2019-05-27 | Disposition: A | Discharge status: Home / Self Care | Payer: Medicare Other | Source: Ambulatory Visit | Attending: Pulmonary Disease | Admitting: Pulmonary Disease

## 2019-05-27 ENCOUNTER — Other Ambulatory Visit (HOSPITAL_COMMUNITY): Payer: Medicare Other

## 2019-05-27 DIAGNOSIS — Z01812 Encounter for preprocedural laboratory examination: Secondary | ICD-10-CM | POA: Insufficient documentation

## 2019-05-27 DIAGNOSIS — Z20822 Contact with and (suspected) exposure to covid-19: Secondary | ICD-10-CM | POA: Insufficient documentation

## 2019-05-27 LAB — SARS CORONAVIRUS 2 (TAT 6-24 HRS): SARS Coronavirus 2: NEGATIVE

## 2019-05-29 ENCOUNTER — Ambulatory Visit (HOSPITAL_BASED_OUTPATIENT_CLINIC_OR_DEPARTMENT_OTHER): Payer: Medicare Other | Attending: Emergency Medicine | Admitting: Pulmonary Disease

## 2019-05-29 ENCOUNTER — Other Ambulatory Visit: Payer: Self-pay

## 2019-05-29 DIAGNOSIS — G4733 Obstructive sleep apnea (adult) (pediatric): Secondary | ICD-10-CM

## 2019-05-31 ENCOUNTER — Other Ambulatory Visit: Payer: Self-pay | Admitting: *Deleted

## 2019-05-31 NOTE — Patient Outreach (Signed)
[facility]) Care Management  05/31/2019  Artrice Stmary Rodin 07-16-1950 FY:[id number]  RN Health Coach case closure. Multi[le attempts to establish contact with patient without success. No response from letter mailed to patient. Case is being closed at this time.   Plan: RN Health Coach will close case based on inability to establish contact with patient.  [facility] Care Management [phone]

## 2019-06-01 ENCOUNTER — Ambulatory Visit (HOSPITAL_COMMUNITY): Payer: Medicare Other | Admitting: Nurse Practitioner

## 2019-06-01 NOTE — Progress Notes (Signed)
CARDIOLOGY OFFICE NOTE  Date:  06/07/2019    [place] Date of Birth: [birth year] Medical Record [id number]  PCP:  Biagio Borg, MD  Cardiologist:  [facility]  Chief Complaint  Patient presents with  . Follow-up    History of Present Illness: Andrea Perry is a 69 y.o. female who presents today for a follow up visit. Seen for Dr. Johnsie Cancel.   She has a history of diastolic dysfunction, DM, HTN, HLD and long standing symptomatic PAF - on anticoagulation with cost concerns. No history of CAD - last Myoview from 2016 was normal.   Last seen by Dr. Johnsie Cancel back in November - she was back in AF - had been off of anticoagulation again due to cost. She had associated volume overload. Discussion about increasing her Flecainide and doing monitor - and then possible cardioversion 3 to 4 weeks later. She has also been seen in the AF clinic - did undergo cardioversion but became bradycardic - beta blocker cut back - then found to be in atrial flutter which has persisted. She was left on Flecainide - beta blocker increased back up - echo was updated - patient did not wish to proceed with ablation or Tikosyn - opted for rate control. She was to be seen back there earlier this month.   The patient does not have symptoms concerning for COVID-19 infection (fever, chills, cough, or new shortness of breath).   Comes in today. Here alone. She says she is "fair". No chest pain. Breathing is ok. HR is in the 90's. Her weight is up - she has massive pedal edema. She is on high dose Norvasc. She asked about her echo results - we reviewed - she has normal EF. She is trying to use less salt. BP looks good. She reiterates that she wishes to stay with rate control.   Past Medical History:  Diagnosis Date  . ALLERGIC RHINITIS 08/16/2008  . ANXIETY 08/16/2008  . Arthritis    hands, knees, lower back  . ASTHMA 08/16/2008  . Asthma    BRONCHITIS     DR. Lamonte Sakai    . Bladder leak   . Cancer ([place])    MELANOMA      . Chronic lower back pain    problems with disc L2-5  . COPD 08/16/2008  . DEPRESSION 08/16/2008  . Diabetes mellitus without complication ([place])   . Dysrhythmia    hx AF  . Excessive daytime sleepiness 06/19/2015  . GENITAL HERPES 12/03/2006  . GERD (gastroesophageal reflux disease)   . H/O hiatal hernia   . HEMATOCHEZIA 06/20/2009  . Hemorrhoids   . History of cardioversion 10/30/14  . History of kidney stones   . HYPERLIPIDEMIA 08/16/2008  . HYPERTENSION 12/03/2006  . Hypertension   . Impaired glucose tolerance 09/21/2010  . Overactive bladder 10/20/2015  . Pneumonia    as a child  . Pulmonary HTN ([place]) 06/19/2015  . Shortness of breath dyspnea   . Sleep apnea    MILD NO CPAP ORDERED  . Snoring 06/19/2015    Past Surgical History:  Procedure Laterality Date  . ABDOMINAL HYSTERECTOMY    . APPENDECTOMY    . CARDIOVERSION N/A 03/20/2014   Procedure: CARDIOVERSION;  Surgeon: Josue Hector, MD;  Location: [facility] ENDOSCOPY;  Service: Cardiovascular;  Laterality: N/A;  . CARDIOVERSION N/A 10/30/2014   Procedure: CARDIOVERSION;  Surgeon: Josue Hector, MD;  Location: [facility] ENDOSCOPY;  Service: Cardiovascular;  Laterality: N/A;  . CARDIOVERSION  N/A 04/03/2019   Procedure: CARDIOVERSION;  Surgeon: Dorothy Spark, MD;  Location: [facility] ENDOSCOPY;  Service: Cardiovascular;  Laterality: N/A;  . CARPAL TUNNEL RELEASE     Right + LEFT  . CESAREAN SECTION     x 1   . COLONOSCOPY  2004  . CYST EXCISION     RT ARM   . CYSTOCELE REPAIR N/A 10/25/2012   Procedure: ANTERIOR REPAIR (CYSTOCELE);  Surgeon: Gus Height, MD;  Location: [facility];  Service: Gynecology;  Laterality: N/A;  . HEEL SPUR SURGERY Bilateral   . KNEE SURGERY     Right + LEFT  . LUMBAR LAMINECTOMY/DECOMPRESSION MICRODISCECTOMY N/A 03/19/2016   Procedure: Laminectomy and Foraminotomy - Lumbar three-four - Lumbar four-five;  Surgeon: Eustace Moore, MD;  Location: [place];  Service: Neurosurgery;  Laterality: N/A;  . RADIOLOGY WITH  ANESTHESIA Right 11/01/2014   Procedure: MRI RIGHT FOREARM;  Surgeon: Medication Radiologist, MD;  Location: [place];  Service: Radiology;  Laterality: Right;  . RADIOLOGY WITH ANESTHESIA Right 08/29/2015   Procedure: MRI - RIGHT FOREARM;  Surgeon: Medication Radiologist, MD;  Location: [place];  Service: Radiology;  Laterality: Right;  . RADIOLOGY WITH ANESTHESIA N/A 12/05/2015   Procedure: MRI SPINE WITHOUT;  Surgeon: Medication Radiologist, MD;  Location: [facility];  Service: Radiology;  Laterality: N/A;  . SKIN CANCER EXCISION     BILAT SHOULDERS  . SPLIT NIGHT STUDY  09/04/2015  . TONSILLECTOMY AND ADENOIDECTOMY       Medications: Current Meds  Medication Sig  . albuterol (VENTOLIN HFA) 108 (90 Base) MCG/ACT inhaler Inhale 2 puffs by mouth every 6 hours as needed for wheezing or shortness of breath.  Marland Kitchen apixaban (ELIQUIS) 5 MG TABS tablet Take 1 tablet (5 mg total) by mouth 2 (two) times daily.  . Blood Glucose Monitoring Suppl (FREESTYLE LITE) DEVI Use as directed daily E11.9  . Budeson-Glycopyrrol-Formoterol (BREZTRI AEROSPHERE) 160-9-4.8 MCG/ACT AERO Inhale 2 puffs into the lungs 2 (two) times daily.  . cetirizine (ZYRTEC) 10 MG tablet Take 10 mg by mouth daily.   . Cholecalciferol (VITAMIN D) 2000 units tablet Take 2,000 Units by mouth daily.  Marland Kitchen dimenhyDRINATE (DRAMAMINE) 50 MG tablet Take 50 mg by mouth daily as needed for nausea.  . diphenoxylate-atropine (LOMOTIL) 2.5-0.025 MG tablet Take 1 tablet by mouth 4 (four) times daily as needed for diarrhea or loose stools.  Marland Kitchen escitalopram (LEXAPRO) 20 MG tablet Take 1 tablet by mouth once daily  . estradiol (ESTRACE) 1 MG tablet Take 1 mg by mouth daily.  . furosemide (LASIX) 40 MG tablet Take 1 tablet by mouth once daily  . glucose blood (FREESTYLE LITE) test strip Use as instructed once daily E11.9  . guaiFENesin (MUCINEX) 600 MG 12 hr tablet Take 1 tablet (600 mg total) by mouth 2 (two) times daily as needed for cough or to loosen phlegm.  Marland Kitchen  ipratropium-albuterol (DUONEB) 0.5-2.5 (3) MG/3ML SOLN USE 1 AMPULE IN NEBULIZER 4 TIMES DAILY  . irbesartan (AVAPRO) 150 MG tablet Take 1 tablet by mouth once daily  . Lancets MISC Use as directed once daily E11.9  . levothyroxine (SYNTHROID) 25 MCG tablet Take 1 tablet (25 mcg total) by mouth daily before breakfast.  . LORazepam (ATIVAN) 1 MG tablet Take 1 tablet by mouth twice daily as needed for anxiety  . metFORMIN (GLUCOPHAGE) 500 MG tablet Take 1 tablet (500 mg total) by mouth daily with breakfast.  . nystatin (MYCOSTATIN/NYSTOP) powder Use as directed twice daily as needed  .  Omega-3 Fatty Acids (FISH OIL) 1200 MG CAPS Take 1,200 mg by mouth daily.   . ondansetron (ZOFRAN) 4 MG tablet Take 1 tablet (4 mg total) by mouth every 8 (eight) hours as needed for nausea or vomiting.  . pantoprazole (PROTONIX) 40 MG tablet Take 1 tablet by mouth once daily  . potassium chloride SA (K-DUR,KLOR-CON) 20 MEQ tablet Take 1 tablet (20 mEq total) by mouth daily.  . pravastatin (PRAVACHOL) 40 MG tablet Take 1 tablet by mouth once daily  . Tetrahydrozoline HCl (VISINE OP) Place 1 drop into both eyes daily as needed (dry eyes).  . tolterodine (DETROL LA) 4 MG 24 hr capsule Take 4 mg by mouth daily.  . vitamin E 400 UNIT capsule Take 400 Units by mouth daily.  . [DISCONTINUED] amLODipine (NORVASC) 10 MG tablet Take 1 tablet by mouth once daily  . [DISCONTINUED] metoprolol tartrate (LOPRESSOR) 50 MG tablet Take 1.5 tablets (75 mg total) by mouth 2 (two) times daily.     Allergies: Allergies  Allergen Reactions  . Tramadol Hcl Nausea And Vomiting and Other (See Comments)     mouth dryness, headache  . Codeine Nausea And Vomiting and Nausea Only  . Tape Rash    Plastic tape, bandaids and ekg leads, causes redness and rash  . Vicodin [Hydrocodone-Acetaminophen] Nausea And Vomiting    Social History: The patient  reports that she quit smoking about 16 years ago. Her smoking use included cigarettes.  She has a 45.00 pack-year smoking history. She has never used smokeless tobacco. She reports current alcohol use of about 2.0 standard drinks of alcohol per week. She reports that she does not use drugs.   Family History: The patient's family history includes Colon polyps in her mother; Diabetes in her mother; Hyperlipidemia in her mother; Hypertension in her mother; Stroke in her father.   Review of Systems: Please see the history of present illness.   All other systems are reviewed and negative.   Physical Exam: VS:  BP 124/80   Pulse 93   Ht 5\' 2"  (1.575 m)   Wt 284 lb 6.4 oz (129 kg)   SpO2 97%   BMI 52.02 kg/m  .  BMI Body mass index is 52.02 kg/m.  Wt Readings from Last 3 Encounters:  06/07/19 284 lb 6.4 oz (129 kg)  06/06/19 286 lb 9.6 oz (130 kg)  05/29/19 270 lb (122.5 kg)    General: Pleasant. Alert. She is in no acute distress.   HEENT: Normal.  Cardiac: Irregular irregular rhythm. Rate is just fair. Massive pedal edema.  Respiratory:  Lungs are clear to auscultation bilaterally with normal work of breathing.  MS: No deformity or atrophy. Gait and ROM intact.  Skin: Warm and dry. Color is normal.  Neuro:  Strength and sensation are intact and no gross focal deficits noted.  Psych: Alert, appropriate and with normal affect.   LABORATORY DATA:  EKG:  EKG is not ordered today.  Lab Results  Component Value Date   WBC 9.9 03/28/2019   HGB 12.0 03/28/2019   HCT 38.6 03/28/2019   PLT 322 03/28/2019   GLUCOSE 151 (H) 03/28/2019   CHOL 163 03/23/2018   TRIG 168.0 (H) 03/23/2018   HDL 41.90 03/23/2018   LDLDIRECT 141.0 10/16/2015   LDLCALC 87 03/23/2018   ALT 13 02/09/2019   AST 16 02/09/2019   NA 140 03/28/2019   K 4.4 03/28/2019   CL 102 03/28/2019   CREATININE 0.92 03/28/2019   BUN 21  03/28/2019   CO2 25 03/28/2019   TSH 7.12 (H) 03/23/2018   INR 1.00 03/18/2016   HGBA1C 6.3 (A) 03/29/2019   MICROALBUR 96.6 (H) 03/17/2017     BNP (last 3  results) No results for input(s): BNP in the last 8760 hours.  ProBNP (last 3 results) Recent Labs    02/09/19 1445  PROBNP 1,611*     Other Studies Reviewed Today:  ECHO IMPRESSIONS 05/2019  1. Left ventricular ejection fraction, by visual estimation, is 55 to  60%. The left ventricle has normal function. There is no left ventricular  hypertrophy.  2. Left ventricular diastolic parameters are indeterminate.  3. Global right ventricle has normal systolic function.The right  ventricular size is normal.  4. Left atrial size was normal.  5. Right atrial size was normal.  6. Mild mitral annular calcification.  7. The mitral valve is normal in structure. No evidence of mitral valve  regurgitation.  8. The tricuspid valve is normal in structure.  9. The tricuspid valve is normal in structure. Tricuspid valve  regurgitation is not demonstrated.  10. The aortic valve was not well visualized. Aortic valve regurgitation  is not visualized. No evidence of aortic valve stenosis.  11. The pulmonic valve was not well visualized. Pulmonic valve  regurgitation is not visualized.  12. The inferior vena cava is dilated in size with <50% respiratory  variability, suggesting right atrial pressure of 15 mmHg.  13. TR signal is inadequate for assessing pulmonary artery systolic  pressure.     Procedure: DC Cardioversion 03/2019 Indications: atrial fibrillation  Procedure Details Consent: Obtained Time Out: Verified patient identification, verified procedure, site/side was marked, verified correct patient position, special equipment/implants available, Radiology Safety Procedures followed,  medications/allergies/relevent history reviewed, required imaging and test results available.  Performed  The patient has been on adequate anticoagulation.  The patient received IV propofol administered by anesthesia staff for deep sedation.  Synchronous cardioversion was performed at 120  joules.  The cardioversion was successful.   Complications: No apparent complications Patient did tolerate procedure well.   [name], MD, [facility] 04/03/2019, 10:38 AM   ASSESSMENT & PLAN:    1. PAF - now persistent - has opted for rate control option - Flecainide has been stopped. She is not interested in other therapies at this time.   2. Probable diastolic dysfunction/CHF - massive pedal edema - on high dose Norvasc - cutting this back today.   3. HTN - BP is fine - I am going to increase her beta blocker further since I am cutting the Norvasc back.   4. HLD - not discussed.   5. DM - per PCP  6. Chronic anticoagulation - no problems noted.   7. COVID-19 Education: The signs and symptoms of COVID-19 were discussed with the patient and how to seek care for testing (follow up with PCP or arrange E-visit).  The importance of social distancing, staying at home, hand hygiene and wearing a mask when out in public were discussed today.  Current medicines are reviewed with the patient today.  The patient does not have concerns regarding medicines other than what has been noted above.  The following changes have been made:  See above.  Labs/ tests ordered today include:   No orders of the defined types were placed in this encounter.    Disposition:   FU with [place] in about 4 to 6 weeks.    Patient is agreeable to this plan and will call if any  problems develop in the interim.   SignedTruitt Merle, NP  06/07/2019 3:08 PM  [facility] [address] [place], [place]  [address] Phone: [phone] Fax: [phone]

## 2019-06-06 ENCOUNTER — Encounter (HOSPITAL_COMMUNITY): Payer: Self-pay | Admitting: Nurse Practitioner

## 2019-06-06 ENCOUNTER — Other Ambulatory Visit: Payer: Self-pay

## 2019-06-06 ENCOUNTER — Ambulatory Visit (HOSPITAL_COMMUNITY)
Admission: RE | Admit: 2019-06-06 | Discharge: 2019-06-06 | Disposition: A | Discharge status: Home / Self Care | Payer: Medicare Other | Source: Ambulatory Visit | Attending: Nurse Practitioner | Admitting: Nurse Practitioner

## 2019-06-06 VITALS — BP 130/84 | HR 99 | Ht 62.0 in | Wt 286.6 lb

## 2019-06-06 DIAGNOSIS — Z79899 Other long term (current) drug therapy: Secondary | ICD-10-CM | POA: Insufficient documentation

## 2019-06-06 DIAGNOSIS — J449 Chronic obstructive pulmonary disease, unspecified: Secondary | ICD-10-CM | POA: Insufficient documentation

## 2019-06-06 DIAGNOSIS — Z7901 Long term (current) use of anticoagulants: Secondary | ICD-10-CM | POA: Diagnosis not present

## 2019-06-06 DIAGNOSIS — F329 Major depressive disorder, single episode, unspecified: Secondary | ICD-10-CM | POA: Insufficient documentation

## 2019-06-06 DIAGNOSIS — I4819 Other persistent atrial fibrillation: Secondary | ICD-10-CM

## 2019-06-06 DIAGNOSIS — E119 Type 2 diabetes mellitus without complications: Secondary | ICD-10-CM | POA: Diagnosis not present

## 2019-06-06 DIAGNOSIS — M1389 Other specified arthritis, multiple sites: Secondary | ICD-10-CM | POA: Diagnosis not present

## 2019-06-06 DIAGNOSIS — D6869 Other thrombophilia: Secondary | ICD-10-CM

## 2019-06-06 DIAGNOSIS — I480 Paroxysmal atrial fibrillation: Secondary | ICD-10-CM | POA: Diagnosis not present

## 2019-06-06 DIAGNOSIS — M545 Low back pain: Secondary | ICD-10-CM | POA: Diagnosis not present

## 2019-06-06 DIAGNOSIS — Z7984 Long term (current) use of oral hypoglycemic drugs: Secondary | ICD-10-CM | POA: Insufficient documentation

## 2019-06-06 DIAGNOSIS — Z87891 Personal history of nicotine dependence: Secondary | ICD-10-CM | POA: Diagnosis not present

## 2019-06-06 DIAGNOSIS — I4892 Unspecified atrial flutter: Secondary | ICD-10-CM | POA: Diagnosis not present

## 2019-06-06 DIAGNOSIS — K219 Gastro-esophageal reflux disease without esophagitis: Secondary | ICD-10-CM | POA: Insufficient documentation

## 2019-06-06 DIAGNOSIS — E785 Hyperlipidemia, unspecified: Secondary | ICD-10-CM | POA: Insufficient documentation

## 2019-06-06 DIAGNOSIS — G8929 Other chronic pain: Secondary | ICD-10-CM | POA: Diagnosis not present

## 2019-06-06 DIAGNOSIS — B009 Herpesviral infection, unspecified: Secondary | ICD-10-CM | POA: Insufficient documentation

## 2019-06-06 DIAGNOSIS — F419 Anxiety disorder, unspecified: Secondary | ICD-10-CM | POA: Diagnosis not present

## 2019-06-06 NOTE — Progress Notes (Signed)
Primary Care Physician: Biagio Borg, MD Referring Physician: Dr. Rae Lips is a 69 y.o. female  diastolic dysfunction,prior tobacco use, COPD, adult asthma,  DM, HTN, HLD. Started on Glucophage by Dr Jenny Reichmann in June A1c was elevated over 8.1  She has had long  standing PAF and tends  to retain fluid when she is in afib. Was supposed to have DCCV  in June 2019 but indicated she could not afford eliquis and cancelled procedure No history of CAD last normal myovue was 2016    She was seen by Dr. Johnsie Cancel in November 2020 and was off anticoagulation for above  cost issues. It was restarted and she was asked to f/u in afib clinic to set up DCCV. She remained in afib. She states has been on Eliquis 5 mg bid  for at least 3 weeks without interruption, CHA2DS2VASc score  at least 4. . Her flecainide was increased to 75 mg bid at that visit 11/17. She remained in rate controlled afib . Has significant chronic LLE.. If not tolerable to pt, she will increase lasix x 3 days. She is not usually aware of when she is in afib. Rare alcohol use, no tobacco, mild OSA, not on cpap, sedentary and with BMI of 49.90.  F/u in afib clinic, 12/ 21/20. She had successful cardioversion, 12/14 but had bradycardia in the low 40's and BB was cut back. SHe is niow in atrail flutter in the 80's, states that she has felt  better though.  F/u in afib clinic, 05/17/19. She  remains in atrial  flutter with fast v rate at 112 bpm.  She cannot tell when she is in afib or not. Chronic LLE, has not  taken her diuretics today. She does remain  on flecainide and eliquis 5 mg bid. Options regarding other antiarrythmic's to restore SR discussed, but she stated that  she does not feel afib, she feels no different in afib, LEE in not worse in afib  and she rather go forward with rate control option.   F/u in afib clinic, 06/06/19.I increased BB on last visit and she is better rate controlled. Echo obtained and EF remains in normal  range. I discussed rhythm control strategy again today, but pt is clear that she was to continue with rate control.    Today, she denies symptoms of palpitations, chest pain,+ chronic  shortness of breath, - orthopnea, PND, + chronic  lower extremity edema, dizziness, presyncope, syncope, or neurologic sequela. The patient is tolerating medications without difficulties and is otherwise without complaint today.   Past Medical History:  Diagnosis Date  . ALLERGIC RHINITIS 08/16/2008  . ANXIETY 08/16/2008  . Arthritis    hands, knees, lower back  . ASTHMA 08/16/2008  . Asthma    BRONCHITIS     DR. Lamonte Sakai    . Bladder leak   . Cancer ([place])    MELANOMA      . Chronic lower back pain    problems with disc L2-5  . COPD 08/16/2008  . DEPRESSION 08/16/2008  . Diabetes mellitus without complication ([place])   . Dysrhythmia    hx AF  . Excessive daytime sleepiness 06/19/2015  . GENITAL HERPES 12/03/2006  . GERD (gastroesophageal reflux disease)   . H/O hiatal hernia   . HEMATOCHEZIA 06/20/2009  . Hemorrhoids   . History of cardioversion 10/30/14  . History of kidney stones   . HYPERLIPIDEMIA 08/16/2008  . HYPERTENSION 12/03/2006  . Hypertension   .  Impaired glucose tolerance 09/21/2010  . Overactive bladder 10/20/2015  . Pneumonia    as a child  . Pulmonary HTN ([place]) 06/19/2015  . Shortness of breath dyspnea   . Sleep apnea    MILD NO CPAP ORDERED  . Snoring 06/19/2015   Past Surgical History:  Procedure Laterality Date  . ABDOMINAL HYSTERECTOMY    . APPENDECTOMY    . CARDIOVERSION N/A 03/20/2014   Procedure: CARDIOVERSION;  Surgeon: Josue Hector, MD;  Location: [facility] ENDOSCOPY;  Service: Cardiovascular;  Laterality: N/A;  . CARDIOVERSION N/A 10/30/2014   Procedure: CARDIOVERSION;  Surgeon: Josue Hector, MD;  Location: [facility] ENDOSCOPY;  Service: Cardiovascular;  Laterality: N/A;  . CARDIOVERSION N/A 04/03/2019   Procedure: CARDIOVERSION;  Surgeon: Dorothy Spark, MD;  Location: [facility] ENDOSCOPY;   Service: Cardiovascular;  Laterality: N/A;  . CARPAL TUNNEL RELEASE     Right + LEFT  . CESAREAN SECTION     x 1   . COLONOSCOPY  2004  . CYST EXCISION     RT ARM   . CYSTOCELE REPAIR N/A 10/25/2012   Procedure: ANTERIOR REPAIR (CYSTOCELE);  Surgeon: Gus Height, MD;  Location: [facility];  Service: Gynecology;  Laterality: N/A;  . HEEL SPUR SURGERY Bilateral   . KNEE SURGERY     Right + LEFT  . LUMBAR LAMINECTOMY/DECOMPRESSION MICRODISCECTOMY N/A 03/19/2016   Procedure: Laminectomy and Foraminotomy - Lumbar three-four - Lumbar four-five;  Surgeon: Eustace Moore, MD;  Location: [place];  Service: Neurosurgery;  Laterality: N/A;  . RADIOLOGY WITH ANESTHESIA Right 11/01/2014   Procedure: MRI RIGHT FOREARM;  Surgeon: Medication Radiologist, MD;  Location: [place];  Service: Radiology;  Laterality: Right;  . RADIOLOGY WITH ANESTHESIA Right 08/29/2015   Procedure: MRI - RIGHT FOREARM;  Surgeon: Medication Radiologist, MD;  Location: [place];  Service: Radiology;  Laterality: Right;  . RADIOLOGY WITH ANESTHESIA N/A 12/05/2015   Procedure: MRI SPINE WITHOUT;  Surgeon: Medication Radiologist, MD;  Location: [place];  Service: Radiology;  Laterality: N/A;  . SKIN CANCER EXCISION     BILAT SHOULDERS  . SPLIT NIGHT STUDY  09/04/2015  . TONSILLECTOMY AND ADENOIDECTOMY      Current Outpatient Medications  Medication Sig Dispense Refill  . albuterol (VENTOLIN HFA) 108 (90 Base) MCG/ACT inhaler Inhale 2 puffs by mouth every 6 hours as needed for wheezing or shortness of breath. 18 g 5  . amLODipine (NORVASC) 10 MG tablet Take 1 tablet by mouth once daily 90 tablet 3  . apixaban (ELIQUIS) 5 MG TABS tablet Take 1 tablet (5 mg total) by mouth 2 (two) times daily. 60 tablet 5  . Blood Glucose Monitoring Suppl (FREESTYLE LITE) DEVI Use as directed daily E11.9 1 each 0  . Budeson-Glycopyrrol-Formoterol (BREZTRI AEROSPHERE) 160-9-4.8 MCG/ACT AERO Inhale 2 puffs into the lungs 2 (two) times daily. 10.7 g 4  . cetirizine  (ZYRTEC) 10 MG tablet Take 10 mg by mouth daily.     . Cholecalciferol (VITAMIN D) 2000 units tablet Take 2,000 Units by mouth daily.    Marland Kitchen dimenhyDRINATE (DRAMAMINE) 50 MG tablet Take 50 mg by mouth daily as needed for nausea.    . diphenoxylate-atropine (LOMOTIL) 2.5-0.025 MG tablet Take 1 tablet by mouth 4 (four) times daily as needed for diarrhea or loose stools. 40 tablet 0  . escitalopram (LEXAPRO) 20 MG tablet Take 1 tablet by mouth once daily 90 tablet 3  . estradiol (ESTRACE) 1 MG tablet Take 1 mg by mouth daily.    Marland Kitchen  furosemide (LASIX) 40 MG tablet Take 1 tablet by mouth once daily 90 tablet 1  . glucose blood (FREESTYLE LITE) test strip Use as instructed once daily E11.9 100 each 12  . guaiFENesin (MUCINEX) 600 MG 12 hr tablet Take 1 tablet (600 mg total) by mouth 2 (two) times daily as needed for cough or to loosen phlegm. 60 tablet 2  . ipratropium-albuterol (DUONEB) 0.5-2.5 (3) MG/3ML SOLN USE 1 AMPULE IN NEBULIZER 4 TIMES DAILY 360 mL 12  . irbesartan (AVAPRO) 150 MG tablet Take 1 tablet by mouth once daily 90 tablet 0  . Lancets MISC Use as directed once daily E11.9 100 each 11  . levothyroxine (SYNTHROID) 25 MCG tablet Take 1 tablet (25 mcg total) by mouth daily before breakfast. 90 tablet 3  . LORazepam (ATIVAN) 1 MG tablet Take 1 tablet by mouth twice daily as needed for anxiety 60 tablet 5  . metFORMIN (GLUCOPHAGE) 500 MG tablet Take 1 tablet (500 mg total) by mouth daily with breakfast. 90 tablet 3  . metoprolol tartrate (LOPRESSOR) 50 MG tablet Take 1.5 tablets (75 mg total) by mouth 2 (two) times daily. 270 tablet 3  . nystatin (MYCOSTATIN/NYSTOP) powder Use as directed twice daily as needed 30 g 1  . Omega-3 Fatty Acids (FISH OIL) 1200 MG CAPS Take 1,200 mg by mouth daily.     . ondansetron (ZOFRAN) 4 MG tablet Take 1 tablet (4 mg total) by mouth every 8 (eight) hours as needed for nausea or vomiting. 30 tablet 1  . pantoprazole (PROTONIX) 40 MG tablet Take 1 tablet by mouth  once daily 90 tablet 2  . potassium chloride SA (K-DUR,KLOR-CON) 20 MEQ tablet Take 1 tablet (20 mEq total) by mouth daily. 90 tablet 3  . pravastatin (PRAVACHOL) 40 MG tablet Take 1 tablet by mouth once daily 90 tablet 0  . Tetrahydrozoline HCl (VISINE OP) Place 1 drop into both eyes daily as needed (dry eyes).    . tolterodine (DETROL LA) 4 MG 24 hr capsule Take 4 mg by mouth daily.    . vitamin E 400 UNIT capsule Take 400 Units by mouth daily.     No current facility-administered medications for this encounter.    Allergies  Allergen Reactions  . Tramadol Hcl Nausea And Vomiting and Other (See Comments)     mouth dryness, headache  . Codeine Nausea And Vomiting and Nausea Only  . Tape Rash    Plastic tape, bandaids and ekg leads, causes redness and rash  . Vicodin [Hydrocodone-Acetaminophen] Nausea And Vomiting    Social History   Socioeconomic History  . Marital status: Widowed    Spouse name: Not on file  . Number of children: 1  . Years of education: Not on file  . Highest education level: Not on file  Occupational History  . Occupation: currently unemployed, former [employer]  Tobacco Use  . Smoking status: Former Smoker    Packs/day: 1.50    Years: 30.00    Pack years: 45.00    Types: Cigarettes    Quit date: 04/21/2003    Years since quitting: 16.1  . Smokeless tobacco: Never Used  Substance and Sexual Activity  . Alcohol use: Yes    Alcohol/week: 2.0 standard drinks    Types: 2 Standard drinks or equivalent per week    Comment: OCCAS  . Drug use: No  . Sexual activity: Not Currently    Birth control/protection: None  Other Topics Concern  . Not on  file  Social History Narrative   3 caffeine drinks daily    Social Determinants of Health   Financial Resource Strain:   . Difficulty of Paying Living Expenses: Not on file  Food Insecurity:   . Worried About Charity fundraiser in the Last Year: Not on file  . Ran Out of Food in the Last  Year: Not on file  Transportation Needs:   . Lack of Transportation (Medical): Not on file  . Lack of Transportation (Non-Medical): Not on file  Physical Activity:   . Days of Exercise per Week: Not on file  . Minutes of Exercise per Session: Not on file  Stress:   . Feeling of Stress : Not on file  Social Connections:   . Frequency of Communication with Friends and Family: Not on file  . Frequency of Social Gatherings with Friends and Family: Not on file  . Attends Religious Services: Not on file  . Active Member of Clubs or Organizations: Not on file  . Attends Archivist Meetings: Not on file  . Marital Status: Not on file  Intimate Partner Violence:   . Fear of Current or Ex-Partner: Not on file  . Emotionally Abused: Not on file  . Physically Abused: Not on file  . Sexually Abused: Not on file    Family History  Problem Relation Age of Onset  . Diabetes Mother   . Hyperlipidemia Mother   . Hypertension Mother   . Colon polyps Mother   . Stroke Father   . Colon cancer Neg Hx     ROS- All systems are reviewed and negative except as per the HPI above  Physical Exam: Vitals:   06/06/19 1411  BP: 130/84  Pulse: 99  Weight: 130 kg  Height: 5\' 2"  (1.575 m)   Wt Readings from Last 3 Encounters:  06/06/19 130 kg  05/29/19 122.5 kg  05/17/19 126.3 kg    Labs: Lab Results  Component Value Date   NA 140 03/28/2019   K 4.4 03/28/2019   CL 102 03/28/2019   CO2 25 03/28/2019   GLUCOSE 151 (H) 03/28/2019   BUN 21 03/28/2019   CREATININE 0.92 03/28/2019   CALCIUM 8.6 (L) 03/28/2019   Lab Results  Component Value Date   INR 1.00 03/18/2016   Lab Results  Component Value Date   CHOL 163 03/23/2018   HDL 41.90 03/23/2018   LDLCALC 87 03/23/2018   TRIG 168.0 (H) 03/23/2018     GEN- The patient is well appearing, alert and oriented x 3 today.   Head- normocephalic, atraumatic Eyes-  Sclera clear, conjunctiva pink Ears- hearing intact Oropharynx-  clear Neck- supple, no JVP Lymph- no cervical lymphadenopathy Lungs- Clear to ausculation bilaterally, normal work of breathing Heart- irregular rate and rhythm, no murmurs, rubs or gallops, PMI not laterally displaced GI- soft, NT, ND, + BS Extremities- no clubbing, cyanosis, or +++ edema MS- no significant deformity or atrophy Skin- no rash or lesion Psych- euthymic mood, full affect Neuro- strength and sensation are intact  EKG-afib at 88 bpm, qrs 92 ms, qtc 496 ms.   Echo- Study Conclusions- 05/24/19 1. Left ventricular ejection fraction, by visual estimation, is 55 to 60%. The left ventricle has normal function. There is no left ventricular hypertrophy. 2. Left ventricular diastolic parameters are indeterminate. 3. Global right ventricle has normal systolic function.The right ventricular size is normal. 4. Left atrial size was normal. 5. Right atrial size was normal. 6. Mild mitral annular  calcification. 7. The mitral valve is normal in structure. No evidence of mitral valve regurgitation. 8. The tricuspid valve is normal in structure. 9. The tricuspid valve is normal in structure. Tricuspid valve regurgitation is not demonstrated. 10. The aortic valve was not well visualized. Aortic valve regurgitation is not visualized. No evidence of aortic valve stenosis. 11. The pulmonic valve was not well visualized. Pulmonic valve regurgitation is not visualized. 12. The inferior vena cava is dilated in size with <50% respiratory variability, suggesting right atrial pressure of 15 mmHg. 13. TR signal is inadequate for assessing pulmonary artery systolic pressure.   Assessment and Plan: 1. Paroxysmal afib Appears to have been in persistent since at least last  June  Recent echo shows preserved  EF Successful  DCCV, 04/02/20, but ERAF Pt states she feels no different in afib  vrs SR  Continue metoprolol tartrate 75 mg bid and may require further titration going forward We discussed  ablation vrs Tikosyn but pt states that she would rather go forward with rate control  strategy  as afib does not seem to bother her Will stop flecainide as it is not effective to keep pt in rhythm   2. CHA2DS2VASc score of at least 4  Continue eliquis 5 mg bid    3. Significant  LLE States did not take diuretic today as she was going to this appointment but states it is her customary amount of swelling  Avoid salt  Elevate legs   Again stressed to her if she is wanting to get back in rhythm now is the time and not one year from now She voiced understanding  I fear her weight and sedentary lifestyle will work against being able  to keep her in rhythm   Has f/u with Truitt Merle, NP 2/17  afib clinic as needed   Butch Penny C. Riggin Cuttino, [facility] [address] [place], [place] [address] [phone]

## 2019-06-06 NOTE — Patient Instructions (Signed)
Stop flecainide 

## 2019-06-07 ENCOUNTER — Encounter: Payer: Self-pay | Admitting: Nurse Practitioner

## 2019-06-07 ENCOUNTER — Ambulatory Visit: Payer: Medicare Other | Admitting: Nurse Practitioner

## 2019-06-07 VITALS — BP 124/80 | HR 93 | Ht 62.0 in | Wt 284.4 lb

## 2019-06-07 DIAGNOSIS — Z79899 Other long term (current) drug therapy: Secondary | ICD-10-CM

## 2019-06-07 DIAGNOSIS — I10 Essential (primary) hypertension: Secondary | ICD-10-CM

## 2019-06-07 DIAGNOSIS — R609 Edema, unspecified: Secondary | ICD-10-CM

## 2019-06-07 DIAGNOSIS — I4819 Other persistent atrial fibrillation: Secondary | ICD-10-CM | POA: Diagnosis not present

## 2019-06-07 DIAGNOSIS — I5032 Chronic diastolic (congestive) heart failure: Secondary | ICD-10-CM

## 2019-06-07 DIAGNOSIS — Z7901 Long term (current) use of anticoagulants: Secondary | ICD-10-CM

## 2019-06-07 MED ORDER — METOPROLOL TARTRATE 100 MG PO TABS: 100.0000 mg | ORAL_TABLET | Freq: Two times a day (BID) | ORAL | 3 refills | Status: DC

## 2019-06-07 MED ORDER — AMLODIPINE BESYLATE 5 MG PO TABS: 5.0000 mg | ORAL_TABLET | Freq: Every day | ORAL | 3 refills | Status: DC

## 2019-06-07 NOTE — Patient Instructions (Addendum)
After Visit Summary:  We will be checking the following labs today - NONE   Medication Instructions:    Continue with your current medicines. BUT  I am cutting the Norvasc back to just 5 mg a day - you can cut your 10 mg tablets in half and use up  I am increasing the Lopressor to 100 mg twice a day - you can take 2 of the 50 mg tablets and use those up.   I did send these to your pharmacy.   Ok to use some extra Lasix for the next day or so.    If you need a refill on your cardiac medications before your next appointment, please call your pharmacy.     Testing/Procedures To Be Arranged:  N/A  Follow-Up:   See me or Dr. Johnsie Cancel in about 4 to 6 weeks.     At [facility], you and your health needs are our priority.  As part of our continuing mission to provide you with exceptional heart care, we have created designated Provider Care Teams.  These Care Teams include your primary Cardiologist (physician) and Advanced Practice Providers (APPs -  Physician Assistants and Nurse Practitioners) who all work together to provide you with the care you need, when you need it.  Special Instructions:  . Stay safe, stay home, wash your hands for at least 20 seconds and wear a mask when out in public.  . It was good to talk with you today.  . I think cutting the Norvasc back will help decrease your swelling.    Call the [place] office at [phone] if you have any questions, problems or concerns.

## 2019-06-13 ENCOUNTER — Telehealth: Payer: Self-pay | Admitting: Pulmonary Disease

## 2019-06-14 NOTE — Telephone Encounter (Signed)
Called and spoke to pt. Pt states the [name] is too expensive and needs a PA per the pharmacy. Advised pt to contact her insurance and ask what the covered alternatives are and their co-pays to see what pt is able to afford, then call us back with the inhalers that are covered. Pt verbalized understanding. Will await call back.

## 2019-06-16 NOTE — Telephone Encounter (Signed)
Initiated PA via [email or web address] Key: [id number] PA has been sent to plan, and determination expected within 3-5 business days. Will keep in triage to follow up on.

## 2019-06-21 ENCOUNTER — Telehealth: Payer: Self-pay | Admitting: Emergency Medicine

## 2019-06-21 DIAGNOSIS — G4733 Obstructive sleep apnea (adult) (pediatric): Secondary | ICD-10-CM | POA: Diagnosis not present

## 2019-06-21 MED ORDER — BREZTRI AEROSPHERE 160-9-4.8 MCG/ACT IN AERO: 2.0000 | INHALATION_SPRAY | Freq: Two times a day (BID) | RESPIRATORY_TRACT | 0 refills | Status: DC

## 2019-06-21 NOTE — Telephone Encounter (Signed)
Checked Cover My Meds. States that the PA was canceled.  Contacted pt to see if she had contacted her insurance company about covered alternatives. There was no answer and no option to leave a message.

## 2019-06-21 NOTE — Procedures (Signed)
    Patient Name: Andrea Perry, Andrea Perry Date: 05/29/2019 Gender: Female D.O.B: 1950/07/10 Age (years): 68 Referring Provider: Baltazar Apo Height (inches): 104 Interpreting Physician: Chesley Mires MD, ABSM Weight (lbs): 270 RPSGT: Laren Everts BMI: 4 MRN: [id number] Neck Size: 16.50  CLINICAL INFORMATION Sleep Study Type: NPSG  Indication for sleep study: COPD, Diabetes, Excessive Daytime Sleepiness, Fatigue, Obesity, OSA, Re-Evaluation, Sleep walking/talking/parasomnias, Snoring, Witnessed Apneas  Epworth Sleepiness Score: 2  Most recent polysomnogram dated 09/04/2015 revealed an AHI of 4.9/h and RDI of 5.9/h.  SLEEP STUDY TECHNIQUE As per the AASM Manual for the Scoring of Sleep and Associated Events v2.3 (April 2016) with a hypopnea requiring 4% desaturations.  The channels recorded and monitored were frontal, central and occipital EEG, electrooculogram (EOG), submentalis EMG (chin), nasal and oral airflow, thoracic and abdominal wall motion, anterior tibialis EMG, snore microphone, electrocardiogram, and pulse oximetry.  MEDICATIONS Medications self-administered by patient taken the night of the study : AMLODIPINE, PROTONIX, LORAZEPAM, ESTRADIOL, METOPROLOL TARTRATE, Eliquis, PRAVASTATIN, Equate Nasal Spray, BREZTRI, Ventolin Neb  SLEEP ARCHITECTURE The study was initiated at 10:31:38 PM and ended at 5:01:57 AM.  Sleep onset time was 36.8 minutes and the sleep efficiency was 66.6%%. The total sleep time was 260 minutes.  Stage REM latency was 321.0 minutes.  The patient spent 25.2%% of the night in stage N1 sleep, 62.5%% in stage N2 sleep, 0.0%% in stage N3 and 12.3% in REM.  Alpha intrusion was absent.  Supine sleep was 100.00%.  RESPIRATORY PARAMETERS The overall apnea/hypopnea index (AHI) was 21.9 per hour. There were 1 total apneas, including 1 obstructive, 0 central and 0 mixed apneas. There were 94 hypopneas and 133 RERAs.  The AHI during Stage REM sleep  was 22.5 per hour.  AHI while supine was 21.9 per hour.  The mean oxygen saturation was 87.9%. The minimum SpO2 during sleep was 79.0%.  moderate snoring was noted during this study.  CARDIAC DATA The 2 lead EKG demonstrated sinus rhythm. The mean heart rate was 52.5 beats per minute. Other EKG findings include: PVCs.  LEG MOVEMENT DATA The total PLMS were 0 with a resulting PLMS index of 0.0. Associated arousal with leg movement index was 2.1 .  IMPRESSIONS - Moderate obstructive sleep apnea occurred during this study (AHI = 21.9/h). - No significant central sleep apnea occurred during this study (CAI = 0.0/h). - Moderate oxygen desaturation was noted during this study (Min O2 = 79.0%). - The patient snored with moderate snoring volume. - EKG findings include PVCs. - Clinically significant periodic limb movements did not occur during sleep. No significant associated arousals.  DIAGNOSIS - Obstructive Sleep Apnea (327.23 [G47.33 ICD-10]) - Nocturnal Hypoxemia (327.26 [G47.36 ICD-10])  RECOMMENDATIONS - Additional therapies include weight loss, CPAP, oral appliance, or surgical assessment.  [Electronically signed] 06/21/2019 09:28 AM  Chesley Mires MD, ABSM Diplomate, American Board of Sleep Medicine   NPI: [id number]

## 2019-06-21 NOTE — Telephone Encounter (Signed)
I called and spoke with the patient and she states that she cant pay $200 for Breztri. I asked her if she has reached out to her insurance company to see what is covered and she states that she hasnt and that this has been the only thing that helps her. I have printed her patient assistance paperwork to pick up with samples so that we can try to see if this will help with cost. She was agreeable.

## 2019-06-22 ENCOUNTER — Telehealth: Payer: Self-pay | Admitting: Pulmonary Disease

## 2019-06-22 DIAGNOSIS — N3941 Urge incontinence: Secondary | ICD-10-CM | POA: Diagnosis not present

## 2019-06-22 DIAGNOSIS — R3915 Urgency of urination: Secondary | ICD-10-CM | POA: Diagnosis not present

## 2019-06-22 NOTE — Telephone Encounter (Signed)
Called and spoke to pt about her medication, Andrea Perry, and the update from insurance and pharmacy (see phone note from 06/13/19). Pt aware we will contact her back about Dr. Agustina Caroli response. Will sign off on this message and keep message from 06/13/19 open.

## 2019-06-22 NOTE — Telephone Encounter (Signed)
lmtcb for pt.    Called CMM and spoke to [place]. She states the reason the PA was cancelled was because there was already an approval on file from 02/2019. Medication will be covered until 03/02/2020. Called pharmacy and was advised it is covered but has a high copay at $264.11.  Pharmacy is there any way to help pt with this copay? Thanks.

## 2019-06-22 NOTE — Telephone Encounter (Signed)
Patient has been seen by pharmacy team previously on 02/24/2019.   Patient was advised to apply to LIS and receive LIS denial prior to applying to [facility] Breztri patient assistance program (PAP). Patient was advised to contact [facility] team once she has received LIS denial.  Patient never followed up with pharmacy team.  If she would like to pursue [place] Breztri PAP she must apply for low income subsidy, as directed to previously.  If interested, patient can set up an additional appt with pharmacy team to determine PIFR scores with Ellipta, HFA, and repsimat devices to see what other options/patient assistance programs we can look into if she would like. At prior appt (02/2019) pharmacy team had not implemented analyzing PIFR scores at that time.   Thank you for involving pharmacy to assist in providing this patient's care.   Drexel Iha, PharmD PGY2 Ambulatory Care Pharmacy Resident

## 2019-06-22 NOTE — Telephone Encounter (Signed)
Findings of benefits investigation via test claims at [facility]:  Insurance: [place] - # 1 inhaler for a 1 month supply through patient's insurance is $ 251.23. Test claim did to show any deductible being applied, but did state that medication is non-formulary. So likely receiving non-formulary pricing.  Trelegy Ellipta - # 1 inhaler for a 1 month supply through patient's insurance is $ 37.00.  If patient is not a good candidate for the Ellipta device, Patient can apply for [place] Patient Assistance for [place]. Per chart, It looks like [facility] tried to assist patient to enroll last year.  11:09 AM Beatriz Chancellor, CPhT

## 2019-06-22 NOTE — Telephone Encounter (Signed)
Dr. Lamonte Sakai, please advise if you would like to keep pt on Breztri and have pt apply for pt assistance of change medication. Thanks.

## 2019-06-26 NOTE — Telephone Encounter (Signed)
[place] paperwork signed

## 2019-06-28 ENCOUNTER — Other Ambulatory Visit: Payer: Self-pay | Admitting: Pharmacist

## 2019-06-28 ENCOUNTER — Ambulatory Visit: Payer: Self-pay | Admitting: Pharmacist

## 2019-06-28 NOTE — Patient Outreach (Signed)
[facility]) Care Management  06/28/2019  Sanyla Lamberty Aki 05/07/1950 [id number]  Patient was called to follow up on medication assistance for 2021. Patient was referred late in the year last year did not return her paperwork before the cut-off date for 2021.   Plan: Close patient's case. HIPAA compliant message was left on her voicemail with my contact information.  Elayne Guerin, PharmD, [place] Clinical Pharmacist [phone]

## 2019-07-05 ENCOUNTER — Other Ambulatory Visit: Payer: Self-pay | Admitting: Internal Medicine

## 2019-07-05 NOTE — Telephone Encounter (Signed)
Please refill as per office routine med refill policy (all routine meds refilled for 3 mo or monthly per pt preference up to one year from last visit, then month to month grace period for 3 mo, then further med refills will have to be denied)  

## 2019-07-05 NOTE — Progress Notes (Signed)
CARDIOLOGY OFFICE NOTE  Date:  07/12/2019    Andrea Perry Date of Birth: May 01, 1950 Medical Record [id number]  PCP:  Biagio Borg, MD  Cardiologist:  Gillian Shields  Chief Complaint  Patient presents with  . Follow-up    Seen for Dr. Johnsie Cancel    History of Present Illness: Andrea Perry is a 69 y.o. female who presents today for a follow up visit. Seen for Dr. Johnsie Cancel.   She has a history of diastolic dysfunction, DM, HTN, HLD and long standing symptomatic PAF - on anticoagulation with cost concerns. No history of CAD - last Myoview from 2016 was normal.   Last seen by Dr. Johnsie Cancel back in November - she was back in AF - had been off of anticoagulation again due to cost. She had associated volume overload. Discussion about increasing her Flecainide and doing monitor - and then possible cardioversion 3 to 4 weeks later. She has also been seen in the AF clinic - did undergo cardioversion but became bradycardic - beta blocker cut back - then found to be in atrial flutter which has persisted. She was left on Flecainide - beta blocker increased back up - echo was updated - patient did not wish to proceed with ablation or Tikosyn - opted for rate control.   I saw her last month - she was doing "fair" - weight was up, massive edema on exam - was on high dose Norvasc - we reviewed her echo. BP ok. She reiterated that her plan was to continue with rate control.   The patient does not have symptoms concerning for COVID-19 infection (fever, chills, cough, or new shortness of breath).   Comes in today. Here alone. She notes that almost every time she eats - she chokes/coughs. Even water sometimes will cause this. Her swelling is much much better by her report. She did not take her Lasix today - but overall she notes considerable improvement. She can now get on different shoes - had scuffs/bedroom slippers on last time. Weight is down. BP is even better by my recheck - 122/60 - large cuff.  No chest pain. Breathing is stable. She feels like she is doing well.   Past Medical History:  Diagnosis Date  . ALLERGIC RHINITIS 08/16/2008  . ANXIETY 08/16/2008  . Arthritis    hands, knees, lower back  . ASTHMA 08/16/2008  . Asthma    BRONCHITIS     DR. Lamonte Sakai    . Bladder leak   . Cancer ([place])    MELANOMA      . Chronic lower back pain    problems with disc L2-5  . COPD 08/16/2008  . DEPRESSION 08/16/2008  . Diabetes mellitus without complication ([place])   . Dysrhythmia    hx AF  . Excessive daytime sleepiness 06/19/2015  . GENITAL HERPES 12/03/2006  . GERD (gastroesophageal reflux disease)   . H/O hiatal hernia   . HEMATOCHEZIA 06/20/2009  . Hemorrhoids   . History of cardioversion 10/30/14  . History of kidney stones   . HYPERLIPIDEMIA 08/16/2008  . HYPERTENSION 12/03/2006  . Hypertension   . Impaired glucose tolerance 09/21/2010  . Overactive bladder 10/20/2015  . Pneumonia    as a child  . Pulmonary HTN ([place]) 06/19/2015  . Shortness of breath dyspnea   . Sleep apnea    MILD NO CPAP ORDERED  . Snoring 06/19/2015    Past Surgical History:  Procedure Laterality Date  . ABDOMINAL HYSTERECTOMY    .  APPENDECTOMY    . CARDIOVERSION N/A 03/20/2014   Procedure: CARDIOVERSION;  Surgeon: Josue Hector, MD;  Location[facility] ENDOSCOPY;  Service: Cardiovascular;  Laterality: N/A;  . CARDIOVERSION N/A 10/30/2014   Procedure: CARDIOVERSION;  Surgeon: Josue Hector, MD;  Location: [facility] ENDOSCOPY;  Service: Cardiovascular;  Laterality: N/A;  . CARDIOVERSION N/A 04/03/2019   Procedure: CARDIOVERSION;  Surgeon: Dorothy Spark, MD;  Location: [facility] ENDOSCOPY;  Service: Cardiovascular;  Laterality: N/A;  . CARPAL TUNNEL RELEASE     Right + LEFT  . CESAREAN SECTION     x 1   . COLONOSCOPY  2004  . CYST EXCISION     RT ARM   . CYSTOCELE REPAIR N/A 10/25/2012   Procedure: ANTERIOR REPAIR (CYSTOCELE);  Surgeon: Gus Height, MD;  Location: [place] [facility];  Service: Gynecology;  Laterality: N/A;  . HEEL SPUR  SURGERY Bilateral   . KNEE SURGERY     Right + LEFT  . LUMBAR LAMINECTOMY/DECOMPRESSION MICRODISCECTOMY N/A 03/19/2016   Procedure: Laminectomy and Foraminotomy - Lumbar three-four - Lumbar four-five;  Surgeon: Eustace Moore, MD;  Location: [place];  Service: Neurosurgery;  Laterality: N/A;  . RADIOLOGY WITH ANESTHESIA Right 11/01/2014   Procedure: MRI RIGHT FOREARM;  Surgeon: Medication Radiologist, MD;  Location: [place];  Service: Radiology;  Laterality: Right;  . RADIOLOGY WITH ANESTHESIA Right 08/29/2015   Procedure: MRI - RIGHT FOREARM;  Surgeon: Medication Radiologist, MD;  Location: [place];  Service: Radiology;  Laterality: Right;  . RADIOLOGY WITH ANESTHESIA N/A 12/05/2015   Procedure: MRI SPINE WITHOUT;  Surgeon: Medication Radiologist, MD;  Location: [place];  Service: Radiology;  Laterality: N/A;  . SKIN CANCER EXCISION     BILAT SHOULDERS  . SPLIT NIGHT STUDY  09/04/2015  . TONSILLECTOMY AND ADENOIDECTOMY       Medications: Current Meds  Medication Sig  . albuterol (VENTOLIN HFA) 108 (90 Base) MCG/ACT inhaler Inhale 2 puffs by mouth every 6 hours as needed for wheezing or shortness of breath.  Marland Kitchen apixaban (ELIQUIS) 5 MG TABS tablet Take 1 tablet (5 mg total) by mouth 2 (two) times daily.  . Blood Glucose Monitoring Suppl (FREESTYLE LITE) DEVI Use as directed daily E11.9  . Budeson-Glycopyrrol-Formoterol (BREZTRI AEROSPHERE) 160-9-4.8 MCG/ACT AERO Inhale 2 puffs into the lungs 2 (two) times daily.  . cetirizine (ZYRTEC) 10 MG tablet Take 10 mg by mouth daily.   . Cholecalciferol (VITAMIN D) 2000 units tablet Take 2,000 Units by mouth daily.  Marland Kitchen dimenhyDRINATE (DRAMAMINE) 50 MG tablet Take 50 mg by mouth daily as needed for nausea.  . diphenoxylate-atropine (LOMOTIL) 2.5-0.025 MG tablet Take 1 tablet by mouth 4 (four) times daily as needed for diarrhea or loose stools.  Marland Kitchen escitalopram (LEXAPRO) 20 MG tablet Take 1 tablet by mouth once daily  . estradiol (ESTRACE) 1 MG tablet Take 1 mg by  mouth daily.  . furosemide (LASIX) 40 MG tablet Take 1 tablet by mouth once daily  . glucose blood (FREESTYLE LITE) test strip Use as instructed once daily E11.9  . guaiFENesin (MUCINEX) 600 MG 12 hr tablet Take 1 tablet (600 mg total) by mouth 2 (two) times daily as needed for cough or to loosen phlegm.  Marland Kitchen ipratropium-albuterol (DUONEB) 0.5-2.5 (3) MG/3ML SOLN USE 1 AMPULE IN NEBULIZER 4 TIMES DAILY  . irbesartan (AVAPRO) 150 MG tablet Take 1 tablet by mouth once daily  . Lancets MISC Use as directed once daily E11.9  . levothyroxine (SYNTHROID) 25 MCG tablet Take 1 tablet (25  mcg total) by mouth daily before breakfast.  . LORazepam (ATIVAN) 1 MG tablet Take 1 tablet by mouth twice daily as needed for anxiety  . metFORMIN (GLUCOPHAGE) 500 MG tablet Take 1 tablet (500 mg total) by mouth daily with breakfast.  . metoprolol tartrate (LOPRESSOR) 100 MG tablet Take 1 tablet (100 mg total) by mouth 2 (two) times daily.  Marland Kitchen MYRBETRIQ 50 MG TB24 tablet Take 50 mg by mouth daily.  Marland Kitchen nystatin (MYCOSTATIN/NYSTOP) powder Use as directed twice daily as needed  . Omega-3 Fatty Acids (FISH OIL) 1200 MG CAPS Take 1,200 mg by mouth daily.   . ondansetron (ZOFRAN) 4 MG tablet Take 1 tablet (4 mg total) by mouth every 8 (eight) hours as needed for nausea or vomiting.  . pantoprazole (PROTONIX) 40 MG tablet Take 1 tablet by mouth once daily  . potassium chloride SA (K-DUR,KLOR-CON) 20 MEQ tablet Take 1 tablet (20 mEq total) by mouth daily.  . pravastatin (PRAVACHOL) 40 MG tablet Take 1 tablet by mouth once daily  . Tetrahydrozoline HCl (VISINE OP) Place 1 drop into both eyes daily as needed (dry eyes).  . vitamin E 400 UNIT capsule Take 400 Units by mouth daily.  . [DISCONTINUED] amLODipine (NORVASC) 5 MG tablet Take 1 tablet (5 mg total) by mouth daily.     Allergies: Allergies  Allergen Reactions  . Tramadol Hcl Nausea And Vomiting and Other (See Comments)     mouth dryness, headache  . Codeine Nausea And  Vomiting and Nausea Only  . Tape Rash    Plastic tape, bandaids and ekg leads, causes redness and rash  . Vicodin [Hydrocodone-Acetaminophen] Nausea And Vomiting    Social History: The patient  reports that she quit smoking about 16 years ago. Her smoking use included cigarettes. She has a 45.00 pack-year smoking history. She has never used smokeless tobacco. She reports current alcohol use of about 2.0 standard drinks of alcohol per week. She reports that she does not use drugs.   Family History: The patient's family history includes Colon polyps in her mother; Diabetes in her mother; Hyperlipidemia in her mother; Hypertension in her mother; Stroke in her father.   Review of Systems: Please see the history of present illness.   All other systems are reviewed and negative.   Physical Exam: VS:  BP 124/78   Pulse 89   Ht 5\' 2"  (1.575 m)   Wt 277 lb (125.6 kg)   SpO2 96%   BMI 50.66 kg/m  .  BMI Body mass index is 50.66 kg/m.  Wt Readings from Last 3 Encounters:  07/12/19 277 lb (125.6 kg)  06/07/19 284 lb 6.4 oz (129 kg)  06/06/19 286 lb 9.6 oz (130 kg)   BP recheck by me is 112/60 with large cuff.  General: Alert and in no acute distress.  Her weight is down.  Cardiac: Irregular rhythm. Rate is ok. Heart tones are distant. While she still has swelling - it is much improved. She can now wear different shoes than before.   Respiratory:  Lungs are clear to auscultation bilaterally with normal work of breathing.  GI: Soft and nontender.  MS: No deformity or atrophy. Gait and ROM intact.  Skin: Warm and dry. Color is normal.  Neuro:  Strength and sensation are intact and no gross focal deficits noted.  Psych: Alert, appropriate and with normal affect.   LABORATORY DATA:  EKG:  EKG is not ordered today.  Lab Results  Component Value Date  WBC 9.9 03/28/2019   HGB 12.0 03/28/2019   HCT 38.6 03/28/2019   PLT 322 03/28/2019   GLUCOSE 151 (H) 03/28/2019   CHOL 163  03/23/2018   TRIG 168.0 (H) 03/23/2018   HDL 41.90 03/23/2018   LDLDIRECT 141.0 10/16/2015   LDLCALC 87 03/23/2018   ALT 13 02/09/2019   AST 16 02/09/2019   NA 140 03/28/2019   K 4.4 03/28/2019   CL 102 03/28/2019   CREATININE 0.92 03/28/2019   BUN 21 03/28/2019   CO2 25 03/28/2019   TSH 7.12 (H) 03/23/2018   INR 1.00 03/18/2016   HGBA1C 6.3 (A) 03/29/2019   MICROALBUR 96.6 (H) 03/17/2017     BNP (last 3 results) No results for input(s): BNP in the last 8760 hours.  ProBNP (last 3 results) Recent Labs    02/09/19 1445  PROBNP 1,611*     Other Studies Reviewed Today:  ECHO IMPRESSIONS 05/2019  1. Left ventricular ejection fraction, by visual estimation, is 55 to  60%. The left ventricle has normal function. There is no left ventricular  hypertrophy.  2. Left ventricular diastolic parameters are indeterminate.  3. Global right ventricle has normal systolic function.The right  ventricular size is normal.  4. Left atrial size was normal.  5. Right atrial size was normal.  6. Mild mitral annular calcification.  7. The mitral valve is normal in structure. No evidence of mitral valve  regurgitation.  8. The tricuspid valve is normal in structure.  9. The tricuspid valve is normal in structure. Tricuspid valve  regurgitation is not demonstrated.  10. The aortic valve was not well visualized. Aortic valve regurgitation  is not visualized. No evidence of aortic valve stenosis.  11. The pulmonic valve was not well visualized. Pulmonic valve  regurgitation is not visualized.  12. The inferior vena cava is dilated in size with <50% respiratory  variability, suggesting right atrial pressure of 15 mmHg.  13. TR signal is inadequate for assessing pulmonary artery systolic  pressure.     Procedure: DC Cardioversion 03/2019 Indications:atrial fibrillation  Procedure Details Consent:Obtained Time [id number] patient identification, verified procedure,  site/side was marked, verified correct patient position, special equipment/implants available, Radiology Safety Procedures followed, medications/allergies/relevent history reviewed, required imaging and test results available. Performed  The patient has been on adequate anticoagulation. The patient received IV propofol administered by anesthesia staff for deep sedation. Synchronous cardioversion was performed at 120joules.  The cardioversion wassuccessful.   Complications:No apparent complications Patientdidtolerate procedure well.   [name], MD, [facility] 04/03/2019,10:38 AM   ASSESSMENT &PLAN:    1 Persistent AF - has opted for rate control and continued anticoagulation with no plans for further attempts at restoration back to NSR. HR is fine today.   2. HTN - BP by me is great - her swelling improved significantly with reducing her Norvasc - will stop this altogether today.   3. Dysphagia - may need to see GI - I have asked her to touch base with her PCP for recommendation.   4. Probable diastolic dysfunction - she has good BP control - weight trending down.   5. HLD - not discussed  6. Chronic anticoagulation - no problems noted.   7. COVID-19 Education: The signs and symptoms of COVID-19 were discussed with the patient and how to seek care for testing (follow up with PCP or arrange E-visit).  The importance of social distancing, staying at home, hand hygiene and wearing a mask when out in public were discussed today.  Current medicines are reviewed with the patient today.  The patient does not have concerns regarding medicines other than what has been noted above.  The following changes have been made:  See above.  Labs/ tests ordered today include:   No orders of the defined types were placed in this encounter.    Disposition:   FU with [place] in about 3 months.  Overall, pleased with the progress she has made.   Patient is agreeable to this plan  and will call if any problems develop in the interim.   SignedTruitt Merle, NP  07/12/2019 3:03 PM  [facility] [address] [place], [place]  [address] Phone: [phone] Fax: [phone]

## 2019-07-12 ENCOUNTER — Ambulatory Visit (INDEPENDENT_AMBULATORY_CARE_PROVIDER_SITE_OTHER): Payer: Medicare Other | Admitting: Nurse Practitioner

## 2019-07-12 ENCOUNTER — Encounter: Payer: Self-pay | Admitting: Nurse Practitioner

## 2019-07-12 ENCOUNTER — Other Ambulatory Visit: Payer: Self-pay

## 2019-07-12 VITALS — BP 124/78 | HR 89 | Ht 62.0 in | Wt 277.0 lb

## 2019-07-12 DIAGNOSIS — Z7189 Other specified counseling: Secondary | ICD-10-CM

## 2019-07-12 DIAGNOSIS — I4819 Other persistent atrial fibrillation: Secondary | ICD-10-CM

## 2019-07-12 DIAGNOSIS — Z7901 Long term (current) use of anticoagulants: Secondary | ICD-10-CM

## 2019-07-12 DIAGNOSIS — I10 Essential (primary) hypertension: Secondary | ICD-10-CM

## 2019-07-12 DIAGNOSIS — R609 Edema, unspecified: Secondary | ICD-10-CM | POA: Diagnosis not present

## 2019-07-12 DIAGNOSIS — Z79899 Other long term (current) drug therapy: Secondary | ICD-10-CM | POA: Diagnosis not present

## 2019-07-12 DIAGNOSIS — I5032 Chronic diastolic (congestive) heart failure: Secondary | ICD-10-CM | POA: Diagnosis not present

## 2019-07-12 NOTE — Patient Instructions (Addendum)
After Visit Summary:  We will be checking the following labs today - NONE   Medication Instructions:    Continue with your current medicines. BUT  Let's try stopping the Norvasc   If you need a refill on your cardiac medications before your next appointment, please call your pharmacy.     Testing/Procedures To Be Arranged:  N/A  Follow-Up:   See Dr. Johnsie Cancel in about 2 to 3 months for recheck.    At [facility], you and your health needs are our priority.  As part of our continuing mission to provide you with exceptional heart care, we have created designated Provider Care Teams.  These Care Teams include your primary Cardiologist (physician) and Advanced Practice Providers (APPs -  Physician Assistants and Nurse Practitioners) who all work together to provide you with the care you need, when you need it.  Special Instructions:  . Stay safe, stay home, wash your hands for at least 20 seconds and wear a mask when out in public.  . It was good to talk with you today.  . Call and talk with Dr. Jenny Reichmann about the choking/coughing.   Call the [place] office at [phone] if you have any questions, problems or concerns.

## 2019-07-20 DIAGNOSIS — R3915 Urgency of urination: Secondary | ICD-10-CM | POA: Diagnosis not present

## 2019-07-20 DIAGNOSIS — N3281 Overactive bladder: Secondary | ICD-10-CM | POA: Diagnosis not present

## 2019-07-20 DIAGNOSIS — N3941 Urge incontinence: Secondary | ICD-10-CM | POA: Diagnosis not present

## 2019-08-02 ENCOUNTER — Other Ambulatory Visit: Payer: Self-pay | Admitting: Internal Medicine

## 2019-08-02 NOTE — Telephone Encounter (Signed)
Please refill as per office routine med refill policy (all routine meds refilled for 3 mo or monthly per pt preference up to one year from last visit, then month to month grace period for 3 mo, then further med refills will have to be denied)  

## 2019-09-08 DIAGNOSIS — H52223 Regular astigmatism, bilateral: Secondary | ICD-10-CM | POA: Diagnosis not present

## 2019-09-15 NOTE — Progress Notes (Signed)
CARDIOLOGY OFFICE NOTE  Date:  09/20/2019    Andrea Perry Date of Birth: November 24, 1950 Medical Record [id number]  PCP:  Biagio Borg, MD  Cardiologist:  Gillian Shields  No chief complaint on file.   History of Present Illness: Andrea Perry is a 69 y.o. female with a  history of diastolic dysfunction, DM, HTN, HLD and long standing symptomatic PAF - on anticoagulation  No history of CAD   - last Myoview from 2016 was normal.  - Echo 05/24/19 EF 55-60% normal atrial sizes normal RV no significant valve dx  Has failed [place] x 2 as well as flecainide She did not want to pursue hospitalization for Tikosyn or ablation and has been rate controlled on eliquis  Had weight gain and edema on Norvasc Improved when stopped . Complained to NP of some dysphagia on last visit  Seems to have clinical aspiration with coughing after eating frequently   No issues with afib No palpitations, syncope dyspnea or chest pain. Compliant with eliquis and no bleeding issues    Past Medical History:  Diagnosis Date   ALLERGIC RHINITIS 08/16/2008   ANXIETY 08/16/2008   Arthritis    hands, knees, lower back   ASTHMA 08/16/2008   Asthma    BRONCHITIS     DR. Lamonte Sakai     Bladder leak    Cancer (HCC)    MELANOMA       Chronic lower back pain    problems with disc L2-5   COPD 08/16/2008   DEPRESSION 08/16/2008   Diabetes mellitus without complication ([place])    Dysrhythmia    hx AF   Excessive daytime sleepiness 06/19/2015   GENITAL HERPES 12/03/2006   GERD (gastroesophageal reflux disease)    H/O hiatal hernia    HEMATOCHEZIA 06/20/2009   Hemorrhoids    History of cardioversion 10/30/14   History of kidney stones    HYPERLIPIDEMIA 08/16/2008   HYPERTENSION 12/03/2006   Hypertension    Impaired glucose tolerance 09/21/2010   Overactive bladder 10/20/2015   Pneumonia    as a child   Pulmonary HTN ([place]) 06/19/2015   Shortness of breath dyspnea    Sleep apnea    MILD NO CPAP  ORDERED   Snoring 06/19/2015    Past Surgical History:  Procedure Laterality Date   ABDOMINAL HYSTERECTOMY     APPENDECTOMY     CARDIOVERSION N/A 03/20/2014   Procedure: CARDIOVERSION;  Surgeon: Josue Hector, MD;  Location: [place];  Service: Cardiovascular;  Laterality: N/A;   CARDIOVERSION N/A 10/30/2014   Procedure: CARDIOVERSION;  Surgeon: Josue Hector, MD;  Location: [place];  Service: Cardiovascular;  Laterality: N/A;   CARDIOVERSION N/A 04/03/2019   Procedure: CARDIOVERSION;  Surgeon: Dorothy Spark, MD;  Location: [place];  Service: Cardiovascular;  Laterality: N/A;   CARPAL TUNNEL RELEASE     Right + LEFT   CESAREAN SECTION     x 1    COLONOSCOPY  2004   CYST EXCISION     RT ARM    CYSTOCELE REPAIR N/A 10/25/2012   Procedure: ANTERIOR REPAIR (CYSTOCELE);  Surgeon: Gus Height, MD;  Location: [facility];  Service: Gynecology;  Laterality: N/A;   HEEL SPUR SURGERY Bilateral    KNEE SURGERY     Right + LEFT   LUMBAR LAMINECTOMY/DECOMPRESSION MICRODISCECTOMY N/A 03/19/2016   Procedure: Laminectomy and Foraminotomy - Lumbar three-four - Lumbar four-five;  Surgeon: Eustace Moore, MD;  Location: [place];  Service: Neurosurgery;  Laterality: N/A;   RADIOLOGY WITH ANESTHESIA Right 11/01/2014   Procedure: MRI RIGHT FOREARM;  Surgeon: Medication Radiologist, MD;  Location: [place];  Service: Radiology;  Laterality: Right;   RADIOLOGY WITH ANESTHESIA Right 08/29/2015   Procedure: MRI - RIGHT FOREARM;  Surgeon: Medication Radiologist, MD;  Location: [place];  Service: Radiology;  Laterality: Right;   RADIOLOGY WITH ANESTHESIA N/A 12/05/2015   Procedure: MRI SPINE WITHOUT;  Surgeon: Medication Radiologist, MD;  Location: [place];  Service: Radiology;  Laterality: N/A;   SKIN CANCER EXCISION     BILAT SHOULDERS   SPLIT NIGHT STUDY  09/04/2015   TONSILLECTOMY AND ADENOIDECTOMY       Medications: Current Meds  Medication Sig   albuterol (VENTOLIN HFA) 108 (90  Base) MCG/ACT inhaler Inhale 2 puffs by mouth every 6 hours as needed for wheezing or shortness of breath.   apixaban (ELIQUIS) 5 MG TABS tablet Take 1 tablet (5 mg total) by mouth 2 (two) times daily.   Blood Glucose Monitoring Suppl (FREESTYLE LITE) DEVI Use as directed daily E11.9   Budeson-Glycopyrrol-Formoterol (BREZTRI AEROSPHERE) 160-9-4.8 MCG/ACT AERO Inhale 2 puffs into the lungs 2 (two) times daily.   cetirizine (ZYRTEC) 10 MG tablet Take 10 mg by mouth daily.    Cholecalciferol (VITAMIN D) 2000 units tablet Take 2,000 Units by mouth daily.   dimenhyDRINATE (DRAMAMINE) 50 MG tablet Take 50 mg by mouth daily as needed for nausea.   diphenoxylate-atropine (LOMOTIL) 2.5-0.025 MG tablet Take 1 tablet by mouth 4 (four) times daily as needed for diarrhea or loose stools.   escitalopram (LEXAPRO) 20 MG tablet Take 1 tablet by mouth once daily   estradiol (ESTRACE) 1 MG tablet Take 1 mg by mouth daily.   furosemide (LASIX) 40 MG tablet Take 1 tablet by mouth once daily   glucose blood (FREESTYLE LITE) test strip Use as instructed once daily E11.9   guaiFENesin (MUCINEX) 600 MG 12 hr tablet Take 1 tablet (600 mg total) by mouth 2 (two) times daily as needed for cough or to loosen phlegm.   ipratropium-albuterol (DUONEB) 0.5-2.5 (3) MG/3ML SOLN USE 1 AMPULE IN NEBULIZER 4 TIMES DAILY   irbesartan (AVAPRO) 150 MG tablet Take 1 tablet by mouth once daily   Lancets MISC Use as directed once daily E11.9   levothyroxine (SYNTHROID) 25 MCG tablet Take 1 tablet (25 mcg total) by mouth daily before breakfast.   LORazepam (ATIVAN) 1 MG tablet Take 1 tablet by mouth twice daily as needed for anxiety   metFORMIN (GLUCOPHAGE) 500 MG tablet Take 1 tablet (500 mg total) by mouth daily with breakfast.   nystatin (MYCOSTATIN/NYSTOP) powder Use as directed twice daily as needed   Omega-3 Fatty Acids (FISH OIL) 1200 MG CAPS Take 1,200 mg by mouth daily.    pantoprazole (PROTONIX) 40 MG tablet  Take 1 tablet by mouth once daily   potassium chloride SA (K-DUR,KLOR-CON) 20 MEQ tablet Take 1 tablet (20 mEq total) by mouth daily.   pravastatin (PRAVACHOL) 40 MG tablet Take 1 tablet by mouth once daily   Tetrahydrozoline HCl (VISINE OP) Place 1 drop into both eyes daily as needed (dry eyes).   vitamin E 400 UNIT capsule Take 400 Units by mouth daily.   [DISCONTINUED] MYRBETRIQ 50 MG TB24 tablet Take 50 mg by mouth daily.   [DISCONTINUED] ondansetron (ZOFRAN) 4 MG tablet Take 1 tablet (4 mg total) by mouth every 8 (eight) hours as needed for nausea or vomiting.    Allergies:  Allergies  Allergen Reactions   Tramadol Hcl Nausea And Vomiting and Other (See Comments)     mouth dryness, headache   Codeine Nausea And Vomiting and Nausea Only   Tape Rash    Plastic tape, bandaids and ekg leads, causes redness and rash   Vicodin [Hydrocodone-Acetaminophen] Nausea And Vomiting    Social History: The patient  reports that she quit smoking about 16 years ago. Her smoking use included cigarettes. She has a 45.00 pack-year smoking history. She has never used smokeless tobacco. She reports current alcohol use of about 2.0 standard drinks of alcohol per week. She reports that she does not use drugs.   Family History: The patient's family history includes Colon polyps in her mother; Diabetes in her mother; Hyperlipidemia in her mother; Hypertension in her mother; Stroke in her father.   Review of Systems: Please see the history of present illness.   All other systems are reviewed and negative.   Physical Exam: VS:  BP 132/82    Pulse 65    Ht 5\' 2"  (1.575 m)    Wt 275 lb (124.7 kg)    SpO2 98%    BMI 50.30 kg/m  .  BMI Body mass index is 50.3 kg/m.  Wt Readings from Last 3 Encounters:  09/20/19 275 lb (124.7 kg)  07/12/19 277 lb (125.6 kg)  06/07/19 284 lb 6.4 oz (129 kg)   Affect appropriate Overweight white female  HEENT: normal Neck supple with no adenopathy JVP normal no  bruits no thyromegaly Lungs clear with no wheezing and good diaphragmatic motion Heart:  S1/S2 no murmur, no rub, gallop or click PMI normal Abdomen: benighn, BS positve, no tenderness, no AAA no bruit.  No HSM or HJR Distal pulses intact with no bruits No edema Neuro non-focal Skin warm and dry No muscular weakness    LABORATORY DATA:  EKG:   06/06/19 afib rate 99 nonspecific ST changes   Lab Results  Component Value Date   WBC 9.9 03/28/2019   HGB 12.0 03/28/2019   HCT 38.6 03/28/2019   PLT 322 03/28/2019   GLUCOSE 151 (H) 03/28/2019   CHOL 163 03/23/2018   TRIG 168.0 (H) 03/23/2018   HDL 41.90 03/23/2018   LDLDIRECT 141.0 10/16/2015   LDLCALC 87 03/23/2018   ALT 13 02/09/2019   AST 16 02/09/2019   NA 140 03/28/2019   K 4.4 03/28/2019   CL 102 03/28/2019   CREATININE 0.92 03/28/2019   BUN 21 03/28/2019   CO2 25 03/28/2019   TSH 7.12 (H) 03/23/2018   INR 1.00 03/18/2016   HGBA1C 6.3 (A) 03/29/2019   MICROALBUR 96.6 (H) 03/17/2017     BNP (last 3 results) No results for input(s): BNP in the last 8760 hours.  ProBNP (last 3 results) Recent Labs    02/09/19 1445  PROBNP 1,611*     Other Studies Reviewed Today:  ECHO IMPRESSIONS 05/2019  1. Left ventricular ejection fraction, by visual estimation, is 55 to  60%. The left ventricle has normal function. There is no left ventricular  hypertrophy.  2. Left ventricular diastolic parameters are indeterminate.  3. Global right ventricle has normal systolic function.The right  ventricular size is normal.  4. Left atrial size was normal.  5. Right atrial size was normal.  6. Mild mitral annular calcification.  7. The mitral valve is normal in structure. No evidence of mitral valve  regurgitation.  8. The tricuspid valve is normal in structure.  9. The tricuspid valve is  normal in structure. Tricuspid valve  regurgitation is not demonstrated.  10. The aortic valve was not well visualized. Aortic  valve regurgitation  is not visualized. No evidence of aortic valve stenosis.  11. The pulmonic valve was not well visualized. Pulmonic valve  regurgitation is not visualized.  12. The inferior vena cava is dilated in size with <50% respiratory  variability, suggesting right atrial pressure of 15 mmHg.  13. TR signal is inadequate for assessing pulmonary artery systolic  pressure.     Procedure: DC Cardioversion 03/2019 Indications:atrial fibrillation  Procedure Details Consent:Obtained Time [id number] patient identification, verified procedure, site/side was marked, verified correct patient position, special equipment/implants available, Radiology Safety Procedures followed, medications/allergies/relevent history reviewed, required imaging and test results available. Performed  The patient has been on adequate anticoagulation. The patient received IV propofol administered by anesthesia staff for deep sedation. Synchronous cardioversion was performed at 120joules.  The cardioversion wassuccessful.   Complications:No apparent complications Patientdidtolerate procedure well.   [name], MD, [facility] 04/03/2019,10:38 AM   ASSESSMENT &PLAN:    1 Persistent AF - has opted for rate control and continued anticoagulation with no plans for further attempts at restoration back to NSR. HR is fine today. Continue eliquis and lopressor   2. HTN - improved norvasc d/c due to swelling   3. Dysphagia - ordered swallowing study f/u with GI or primary after results   4. Probable diastolic dysfunction - she has good BP control - weight trending down. On daily lasix and Kdur   5. HLD -  On pravastatin labs with primary    Current medicines are reviewed with the patient today.  The patient does not have concerns regarding medicines other than what has been noted above.  The following changes have been made:  See above.  Labs/ tests ordered today include:  Swallowing Study    Orders Placed This Encounter  Procedures   DG SWALLOW FUNC OP MEDICARE SPEECH PATH     Disposition:   F/U in a year  Patient is agreeable to this plan and will call if any problems develop in the interim.   Signed: Jenkins Rouge, MD  09/20/2019 4:08 PM  [facility] [address] [place],   [address] Phone: [phone] Fax: [phone]

## 2019-09-20 ENCOUNTER — Other Ambulatory Visit: Payer: Self-pay

## 2019-09-20 ENCOUNTER — Ambulatory Visit: Payer: Medicare Other | Admitting: Cardiovascular Disease

## 2019-09-20 ENCOUNTER — Other Ambulatory Visit (HOSPITAL_COMMUNITY): Payer: Self-pay

## 2019-09-20 ENCOUNTER — Encounter: Payer: Self-pay | Admitting: Cardiovascular Disease

## 2019-09-20 VITALS — BP 132/82 | HR 65 | Ht 62.0 in | Wt 275.0 lb

## 2019-09-20 DIAGNOSIS — I480 Paroxysmal atrial fibrillation: Secondary | ICD-10-CM

## 2019-09-20 DIAGNOSIS — R1310 Dysphagia, unspecified: Secondary | ICD-10-CM

## 2019-09-20 DIAGNOSIS — T17908A Unspecified foreign body in respiratory tract, part unspecified causing other injury, initial encounter: Secondary | ICD-10-CM

## 2019-09-20 NOTE — Patient Instructions (Addendum)
Medication Instructions:  *If you need a refill on your cardiac medications before your next appointment, please call your pharmacy*   Lab Work: If you have labs (blood work) drawn today and your tests are completely normal, you will receive your results only by: Marland Kitchen MyChart Message (if you have MyChart) OR . A paper copy in the mail If you have any lab test that is abnormal or we need to change your treatment, we will call you to review the results.  Testing/Procedures: Your provider would like you to have a barium swallow study.  Follow-Up: At [facility], you and your health needs are our priority.  As part of our continuing mission to provide you with exceptional heart care, we have created designated Provider Care Teams.  These Care Teams include your primary Cardiologist (physician) and Advanced Practice Providers (APPs -  Physician Assistants and Nurse Practitioners) who all work together to provide you with the care you need, when you need it.  We recommend signing up for the patient portal called "MyChart".  Sign up information is provided on this After Visit Summary.  MyChart is used to connect with patients for Virtual Visits (Telemedicine).  Patients are able to view lab/test results, encounter notes, upcoming appointments, etc.  Non-urgent messages can be sent to your provider as well.   To learn more about what you can do with MyChart, go to [email or web address].    Your next appointment:   6 month(s)  The format for your next appointment:   In Person  Provider:   You may see Dr. Johnsie Cancel or one of the following Advanced Practice Providers on your designated Care Team:    Truitt Merle, NP  Cecilie Kicks, NP  Kathyrn Drown, NP

## 2019-09-25 ENCOUNTER — Other Ambulatory Visit: Payer: Self-pay | Admitting: Cardiology

## 2019-09-25 NOTE — Telephone Encounter (Signed)
Pt last saw Dr Johnsie Cancel 09/20/19, last labs 03/28/19 Creat 0.92, age 69, weight 124.7, based on specified criteria pt is on appropriate dosage of Eliquis 5mg  BID.  Will refill rx.

## 2019-09-27 ENCOUNTER — Other Ambulatory Visit: Payer: Self-pay

## 2019-09-27 ENCOUNTER — Encounter: Payer: Self-pay | Admitting: Internal Medicine

## 2019-09-27 ENCOUNTER — Ambulatory Visit (HOSPITAL_COMMUNITY)
Admission: RE | Admit: 2019-09-27 | Discharge: 2019-09-27 | Disposition: A | Discharge status: Home / Self Care | Payer: Medicare Other | Source: Ambulatory Visit | Attending: Cardiovascular Disease | Admitting: Cardiovascular Disease

## 2019-09-27 ENCOUNTER — Other Ambulatory Visit: Payer: Self-pay | Admitting: Internal Medicine

## 2019-09-27 ENCOUNTER — Ambulatory Visit (INDEPENDENT_AMBULATORY_CARE_PROVIDER_SITE_OTHER): Payer: Medicare Other | Admitting: Internal Medicine

## 2019-09-27 VITALS — BP 122/86 | HR 101 | Temp 98.3°F | Ht 62.0 in | Wt 272.0 lb

## 2019-09-27 DIAGNOSIS — T17908A Unspecified foreign body in respiratory tract, part unspecified causing other injury, initial encounter: Secondary | ICD-10-CM

## 2019-09-27 DIAGNOSIS — E785 Hyperlipidemia, unspecified: Secondary | ICD-10-CM | POA: Diagnosis not present

## 2019-09-27 DIAGNOSIS — E039 Hypothyroidism, unspecified: Secondary | ICD-10-CM | POA: Diagnosis not present

## 2019-09-27 DIAGNOSIS — E119 Type 2 diabetes mellitus without complications: Secondary | ICD-10-CM

## 2019-09-27 DIAGNOSIS — R1310 Dysphagia, unspecified: Secondary | ICD-10-CM

## 2019-09-27 DIAGNOSIS — I480 Paroxysmal atrial fibrillation: Secondary | ICD-10-CM | POA: Insufficient documentation

## 2019-09-27 DIAGNOSIS — I10 Essential (primary) hypertension: Secondary | ICD-10-CM

## 2019-09-27 DIAGNOSIS — X58XXXA Exposure to other specified factors, initial encounter: Secondary | ICD-10-CM | POA: Insufficient documentation

## 2019-09-27 DIAGNOSIS — R05 Cough: Secondary | ICD-10-CM | POA: Diagnosis not present

## 2019-09-27 LAB — HEPATIC FUNCTION PANEL
ALT: 15 U/L (ref 0–35)
AST: 19 U/L (ref 0–37)
Albumin: 3.8 g/dL (ref 3.5–5.2)
Alkaline Phosphatase: 71 U/L (ref 39–117)
Bilirubin, Direct: 0.1 mg/dL (ref 0.0–0.3)
Total Bilirubin: 0.5 mg/dL (ref 0.2–1.2)
Total Protein: 6.8 g/dL (ref 6.0–8.3)

## 2019-09-27 LAB — LIPID PANEL
Cholesterol: 155 mg/dL (ref 0–200)
HDL: 38.2 mg/dL — ABNORMAL LOW
NonHDL: 116.69
Total CHOL/HDL Ratio: 4
Triglycerides: 270 mg/dL — ABNORMAL HIGH (ref 0.0–149.0)
VLDL: 54 mg/dL — ABNORMAL HIGH (ref 0.0–40.0)

## 2019-09-27 LAB — BASIC METABOLIC PANEL WITH GFR
BUN: 16 mg/dL (ref 6–23)
CO2: 31 meq/L (ref 19–32)
Calcium: 8.1 mg/dL — ABNORMAL LOW (ref 8.4–10.5)
Chloride: 97 meq/L (ref 96–112)
Creatinine, Ser: 0.87 mg/dL (ref 0.40–1.20)
GFR: 65 mL/min
Glucose, Bld: 140 mg/dL — ABNORMAL HIGH (ref 70–99)
Potassium: 3.9 meq/L (ref 3.5–5.1)
Sodium: 137 meq/L (ref 135–145)

## 2019-09-27 LAB — HEMOGLOBIN A1C: Hgb A1c MFr Bld: 7.7 % — ABNORMAL HIGH (ref 4.6–6.5)

## 2019-09-27 LAB — LDL CHOLESTEROL, DIRECT: Direct LDL: 87 mg/dL

## 2019-09-27 MED ORDER — CYCLOBENZAPRINE HCL 5 MG PO TABS
5.0000 mg | ORAL_TABLET | Freq: Three times a day (TID) | ORAL | 3 refills | Status: DC | PRN
Start: 2019-09-27 — End: 2020-01-22

## 2019-09-27 NOTE — Patient Instructions (Signed)
Please take all new medication as prescribed - the muscle relaxer as needed  Please continue all other medications as before, and refills have been done if requested.  Please have the pharmacy call with any other refills you may need.  Please continue your efforts at being more active, low cholesterol diet, and weight control..  Please keep your appointments with your specialists as you may have planned  Please go to the LAB at the blood drawing area for the tests to be done  You will be contacted by phone if any changes need to be made immediately.  Otherwise, you will receive a letter about your results with an explanation, but please check with MyChart first.  Please remember to sign up for MyChart if you have not done so, as this will be important to you in the future with finding out test results, communicating by private email, and scheduling acute appointments online when needed.  Please make an Appointment to return in 6 months, or sooner if needed

## 2019-09-27 NOTE — Progress Notes (Signed)
Subjective:    Patient ID: Andrea Perry, female    DOB: 1950/12/27, 69 y.o.   MRN: [id number]  HPI  Here to f/u; overall doing ok,  Pt denies chest pain, increasing sob or doe, wheezing, orthopnea, PND, increased LE swelling, palpitations, dizziness or syncope.  Pt denies new neurological symptoms such as new headache, or facial or extremity weakness or numbness.  Pt denies polydipsia, polyuria, or low sugar episode.  Pt states overall good compliance with meds, mostly trying to follow appropriate diet, with wt overall stable,  but little exercise however. Also c/o leg cramps at night despite plenty of fluids.  Denies hyper or hypo thyroid symptoms such as voice, skin or hair change. Past Medical History:  Diagnosis Date  . ALLERGIC RHINITIS 08/16/2008  . ANXIETY 08/16/2008  . Arthritis    hands, knees, lower back  . ASTHMA 08/16/2008  . Asthma    BRONCHITIS     DR. Lamonte Sakai    . Bladder leak   . Cancer ([place])    MELANOMA      . Chronic lower back pain    problems with disc L2-5  . COPD 08/16/2008  . DEPRESSION 08/16/2008  . Diabetes mellitus without complication ([place])   . Dysrhythmia    hx AF  . Excessive daytime sleepiness 06/19/2015  . GENITAL HERPES 12/03/2006  . GERD (gastroesophageal reflux disease)   . H/O hiatal hernia   . HEMATOCHEZIA 06/20/2009  . Hemorrhoids   . History of cardioversion 10/30/14  . History of kidney stones   . HYPERLIPIDEMIA 08/16/2008  . HYPERTENSION 12/03/2006  . Hypertension   . Impaired glucose tolerance 09/21/2010  . Overactive bladder 10/20/2015  . Pneumonia    as a child  . Pulmonary HTN ([place]) 06/19/2015  . Shortness of breath dyspnea   . Sleep apnea    MILD NO CPAP ORDERED  . Snoring 06/19/2015   Past Surgical History:  Procedure Laterality Date  . ABDOMINAL HYSTERECTOMY    . APPENDECTOMY    . CARDIOVERSION N/A 03/20/2014   Procedure: CARDIOVERSION;  Surgeon: Josue Hector, MD;  Location: [facility]COPY;  Service: Cardiovascular;  Laterality: N/A;  .  CARDIOVERSION N/A 10/30/2014   Procedure: CARDIOVERSION;  Surgeon: Josue Hector, MD;  Location: [facility] ENDOSCOPY;  Service: Cardiovascular;  Laterality: N/A;  . CARDIOVERSION N/A 04/03/2019   Procedure: CARDIOVERSION;  Surgeon: Dorothy Spark, MD;  Location: [facility] ENDOSCOPY;  Service: Cardiovascular;  Laterality: N/A;  . CARPAL TUNNEL RELEASE     Right + LEFT  . CESAREAN SECTION     x 1   . COLONOSCOPY  2004  . CYST EXCISION     RT ARM   . CYSTOCELE REPAIR N/A 10/25/2012   Procedure: ANTERIOR REPAIR (CYSTOCELE);  Surgeon: Gus Height, MD;  Location: [facility];  Service: Gynecology;  Laterality: N/A;  . HEEL SPUR SURGERY Bilateral   . KNEE SURGERY     Right + LEFT  . LUMBAR LAMINECTOMY/DECOMPRESSION MICRODISCECTOMY N/A 03/19/2016   Procedure: Laminectomy and Foraminotomy - Lumbar three-four - Lumbar four-five;  Surgeon: Eustace Moore, MD;  Location: [place];  Service: Neurosurgery;  Laterality: N/A;  . RADIOLOGY WITH ANESTHESIA Right 11/01/2014   Procedure: MRI RIGHT FOREARM;  Surgeon: Medication Radiologist, MD;  Location: [place];  Service: Radiology;  Laterality: Right;  . RADIOLOGY WITH ANESTHESIA Right 08/29/2015   Procedure: MRI - RIGHT FOREARM;  Surgeon: Medication Radiologist, MD;  Location: [place];  Service: Radiology;  Laterality: Right;  .  RADIOLOGY WITH ANESTHESIA N/A 12/05/2015   Procedure: MRI SPINE WITHOUT;  Surgeon: Medication Radiologist, MD;  Location: [place];  Service: Radiology;  Laterality: N/A;  . SKIN CANCER EXCISION     BILAT SHOULDERS  . SPLIT NIGHT STUDY  09/04/2015  . TONSILLECTOMY AND ADENOIDECTOMY      reports that she quit smoking about 16 years ago. Her smoking use included cigarettes. She has a 45.00 pack-year smoking history. She has never used smokeless tobacco. She reports current alcohol use of about 2.0 standard drinks of alcohol per week. She reports that she does not use drugs. family history includes Colon polyps in her mother; Diabetes in her mother; Hyperlipidemia  in her mother; Hypertension in her mother; Stroke in her father. Allergies  Allergen Reactions  . Tramadol Hcl Nausea And Vomiting and Other (See Comments)     mouth dryness, headache  . Codeine Nausea And Vomiting and Nausea Only  . Tape Rash    Plastic tape, bandaids and ekg leads, causes redness and rash  . Vicodin [Hydrocodone-Acetaminophen] Nausea And Vomiting   Current Outpatient Medications on File Prior to Visit  Medication Sig Dispense Refill  . albuterol (VENTOLIN HFA) 108 (90 Base) MCG/ACT inhaler Inhale 2 puffs by mouth every 6 hours as needed for wheezing or shortness of breath. 18 g 5  . Blood Glucose Monitoring Suppl (FREESTYLE LITE) DEVI Use as directed daily E11.9 1 each 0  . Budeson-Glycopyrrol-Formoterol (BREZTRI AEROSPHERE) 160-9-4.8 MCG/ACT AERO Inhale 2 puffs into the lungs 2 (two) times daily. 5.9 g 0  . cetirizine (ZYRTEC) 10 MG tablet Take 10 mg by mouth daily.     . Cholecalciferol (VITAMIN D) 2000 units tablet Take 2,000 Units by mouth daily.    Marland Kitchen dimenhyDRINATE (DRAMAMINE) 50 MG tablet Take 50 mg by mouth daily as needed for nausea.    . diphenoxylate-atropine (LOMOTIL) 2.5-0.025 MG tablet Take 1 tablet by mouth 4 (four) times daily as needed for diarrhea or loose stools. 40 tablet 0  . ELIQUIS 5 MG TABS tablet Take 1 tablet by mouth twice daily 60 tablet 5  . escitalopram (LEXAPRO) 20 MG tablet Take 1 tablet by mouth once daily 90 tablet 3  . estradiol (ESTRACE) 1 MG tablet Take 1 mg by mouth daily.    . furosemide (LASIX) 40 MG tablet Take 1 tablet by mouth once daily 90 tablet 1  . glucose blood (FREESTYLE LITE) test strip Use as instructed once daily E11.9 100 each 12  . guaiFENesin (MUCINEX) 600 MG 12 hr tablet Take 1 tablet (600 mg total) by mouth 2 (two) times daily as needed for cough or to loosen phlegm. 60 tablet 2  . ipratropium-albuterol (DUONEB) 0.5-2.5 (3) MG/3ML SOLN USE 1 AMPULE IN NEBULIZER 4 TIMES DAILY 360 mL 12  . irbesartan (AVAPRO) 150 MG  tablet Take 1 tablet by mouth once daily 90 tablet 2  . Lancets MISC Use as directed once daily E11.9 100 each 11  . levothyroxine (SYNTHROID) 25 MCG tablet Take 1 tablet (25 mcg total) by mouth daily before breakfast. 90 tablet 3  . LORazepam (ATIVAN) 1 MG tablet Take 1 tablet by mouth twice daily as needed for anxiety 60 tablet 5  . Omega-3 Fatty Acids (FISH OIL) 1200 MG CAPS Take 1,200 mg by mouth daily.     . pantoprazole (PROTONIX) 40 MG tablet Take 1 tablet by mouth once daily 90 tablet 2  . potassium chloride SA (K-DUR,KLOR-CON) 20 MEQ tablet Take 1 tablet (20 mEq total)  by mouth daily. 90 tablet 3  . pravastatin (PRAVACHOL) 40 MG tablet Take 1 tablet by mouth once daily 90 tablet 0  . Tetrahydrozoline HCl (VISINE OP) Place 1 drop into both eyes daily as needed (dry eyes).    . vitamin E 400 UNIT capsule Take 400 Units by mouth daily.    . metoprolol tartrate (LOPRESSOR) 100 MG tablet Take 1 tablet (100 mg total) by mouth 2 (two) times daily. 180 tablet 3   No current facility-administered medications on file prior to visit.   Review of Systems All otherwise neg per pt    Objective:   Physical Exam BP 122/86 (BP Location: Left Arm, Patient Position: Sitting, Cuff Size: Large)   Pulse (!) 101   Temp 98.3 F (36.8 C) (Oral)   Ht 5\' 2"  (1.575 m)   Wt 272 lb (123.4 kg)   SpO2 97%   BMI 49.75 kg/m  VS noted,  Constitutional: Pt appears in NAD HENT: Head: NCAT.  Right Ear: External ear normal.  Left Ear: External ear normal.  Eyes: . Pupils are equal, round, and reactive to light. Conjunctivae and EOM are normal Nose: without d/c or deformity Neck: Neck supple. Gross normal ROM Cardiovascular: Normal rate and regular rhythm.   Pulmonary/Chest: Effort normal and breath sounds without rales or wheezing.  Abd:  Soft, NT, ND, + BS, no organomegaly Neurological: Pt is alert. At baseline orientation, motor grossly intact Skin: Skin is warm. No rashes, other new lesions, no LE  edema Psychiatric: Pt behavior is normal without agitation  All otherwise neg per pt Lab Results  Component Value Date   WBC 9.9 03/28/2019   HGB 12.0 03/28/2019   HCT 38.6 03/28/2019   PLT 322 03/28/2019   GLUCOSE 140 (H) 09/27/2019   CHOL 155 09/27/2019   TRIG 270.0 (H) 09/27/2019   HDL 38.20 (L) 09/27/2019   LDLDIRECT 87.0 09/27/2019   LDLCALC 87 03/23/2018   ALT 15 09/27/2019   AST 19 09/27/2019   NA 137 09/27/2019   K 3.9 09/27/2019   CL 97 09/27/2019   CREATININE 0.87 09/27/2019   BUN 16 09/27/2019   CO2 31 09/27/2019   TSH 7.12 (H) 03/23/2018   INR 1.00 03/18/2016   HGBA1C 7.7 (H) 09/27/2019   MICROALBUR 96.6 (H) 03/17/2017       Assessment & Plan:

## 2019-09-27 NOTE — Telephone Encounter (Signed)
Please refill as per office routine med refill policy (all routine meds refilled for 3 mo or monthly per pt preference up to one year from last visit, then month to month grace period for 3 mo, then further med refills will have to be denied)  

## 2019-09-28 ENCOUNTER — Telehealth: Payer: Self-pay | Admitting: Cardiovascular Disease

## 2019-09-28 ENCOUNTER — Telehealth: Payer: Self-pay | Admitting: Internal Medicine

## 2019-09-28 NOTE — Telephone Encounter (Signed)
Patient called and said she got a missed call from Dr. Kyla Balzarine office today. There was no documentation from our office called but the patient just wanted to double check.

## 2019-09-28 NOTE — Telephone Encounter (Signed)
Called patient back. Informed her of swallowing test results.

## 2019-09-28 NOTE — Telephone Encounter (Signed)
Please call to discuss lab results 

## 2019-09-29 ENCOUNTER — Telehealth: Payer: Self-pay

## 2019-09-29 ENCOUNTER — Telehealth: Payer: Self-pay | Admitting: Internal Medicine

## 2019-09-29 NOTE — Telephone Encounter (Signed)
Key: [id number]

## 2019-09-29 NOTE — Telephone Encounter (Signed)
Informed Dr. Jenny Reichmann of pts request to know of lab results.

## 2019-09-29 NOTE — Telephone Encounter (Signed)
All ok except the a1c is mild higher at 7.7.  I'll need to consider this further over the weekend, thanks

## 2019-09-29 NOTE — Telephone Encounter (Signed)
Pt called and was wondering if her blood work had came back yet.

## 2019-09-29 NOTE — Telephone Encounter (Signed)
Called pt and informed her that the prior auth was approved and she could call her pharmacy

## 2019-09-29 NOTE — Telephone Encounter (Signed)
Prior auth for cyclobenzaprine (muscle relaxer) has been approved.   Please inform patient of same and have her call the pharmacy to fill at her convenience.

## 2019-09-30 ENCOUNTER — Other Ambulatory Visit: Payer: Self-pay | Admitting: Internal Medicine

## 2019-09-30 ENCOUNTER — Encounter: Payer: Self-pay | Admitting: Internal Medicine

## 2019-09-30 MED ORDER — METFORMIN HCL ER 500 MG PO TB24: 1000.0000 mg | ORAL_TABLET | Freq: Every day | ORAL | 3 refills | Status: DC

## 2019-10-01 ENCOUNTER — Encounter: Payer: Self-pay | Admitting: Internal Medicine

## 2019-10-01 NOTE — Assessment & Plan Note (Signed)
stable overall by history and exam, recent data reviewed with pt, and pt to continue medical treatment as before,  to f/u any worsening symptoms or concerns  

## 2019-10-01 NOTE — Assessment & Plan Note (Addendum)
stable overall by history and exam, recent data reviewed with pt, and pt to continue medical treatment as before,  to f/u any worsening symptoms or concerns  I spent 31 minutes in preparing to see the patient by review of recent labs, imaging and procedures, obtaining and reviewing separately obtained history, communicating with the patient and family or caregiver, ordering medications, tests or procedures, and documenting clinical information in the EHR including the differential Dx, treatment, and any further evaluation and other management of dm, htn, hld, hypothyroidism  

## 2019-10-02 ENCOUNTER — Other Ambulatory Visit: Payer: Self-pay | Admitting: Internal Medicine

## 2019-10-02 NOTE — Telephone Encounter (Signed)
Sorry - ok to cancel the refill

## 2019-10-02 NOTE — Telephone Encounter (Signed)
Please refill as per office routine med refill policy (all routine meds refilled for 3 mo or monthly per pt preference up to one year from last visit, then month to month grace period for 3 mo, then further med refills will have to be denied)  

## 2019-10-03 NOTE — Telephone Encounter (Signed)
Per chart/labs MD increase dosage to (2) a day. Sent updated script.Marland KitchenJohny Chess

## 2019-10-06 ENCOUNTER — Other Ambulatory Visit: Payer: Self-pay | Admitting: Internal Medicine

## 2019-10-06 NOTE — Telephone Encounter (Signed)
Done erx 

## 2019-10-12 ENCOUNTER — Ambulatory Visit: Payer: Medicare Other | Admitting: Primary Care

## 2019-10-19 DIAGNOSIS — H2512 Age-related nuclear cataract, left eye: Secondary | ICD-10-CM | POA: Diagnosis not present

## 2019-10-19 DIAGNOSIS — H25812 Combined forms of age-related cataract, left eye: Secondary | ICD-10-CM | POA: Diagnosis not present

## 2019-10-31 ENCOUNTER — Other Ambulatory Visit: Payer: Self-pay

## 2019-10-31 ENCOUNTER — Ambulatory Visit: Payer: Medicare Other | Admitting: Primary Care

## 2019-10-31 ENCOUNTER — Encounter: Payer: Self-pay | Admitting: Primary Care

## 2019-10-31 VITALS — BP 140/86 | HR 94 | Temp 97.9°F | Ht 62.0 in | Wt 277.0 lb

## 2019-10-31 DIAGNOSIS — G4733 Obstructive sleep apnea (adult) (pediatric): Secondary | ICD-10-CM

## 2019-10-31 DIAGNOSIS — J438 Other emphysema: Secondary | ICD-10-CM

## 2019-10-31 NOTE — Addendum Note (Signed)
Addended byLuanna Salk on: 10/31/2019 12:26 PM   Modules accepted: Orders

## 2019-10-31 NOTE — Patient Instructions (Addendum)
Pleasure meeting you today Andrea Perry  COPD: Continue Breztri two puffs twice daily (rinse mouth after use) Recommend try using nebulizer only as needed 2-4 times and see how you do with this   Sleep apnea: Encourage you work on weight loss for sleep apnea and look into getting wedge pillow for when you are sleeping to limit snoring/obstructive events  Orders: Needs new supplies for nebulizer (tubing, filter and mouth piece)  Follow-up: 6 months with Andrea Perry or sooner if needed

## 2019-10-31 NOTE — Assessment & Plan Note (Signed)
-   Appears stable; No recent exacerbations and lungs clear today  - Continue Breztri two puffs twice daily (rinse mouth after use) - Recommend try using Duoneb as needed only q6 hours for sob/wheezing and see how you do with this  - Needs order placed with DME company for new nebulizer supplies

## 2019-10-31 NOTE — Progress Notes (Signed)
@Patient  ID: Andrea Perry, female    DOB: [birth year], 69 y.o.   MRN: [id number]  Chief Complaint  Patient presents with  . Follow-up    Referring provider: Biagio Borg, MD  HPI: 69 year old female, former smoker. PMH significant for COPD, OSA. Patient of Dr. Lamonte Sakai, last seen in January 2021. Maintained on Breztri two puffs twice daily. Patient did not tolerate NSPG well in February 2021. She did not tolerate CPAP d/t anxiety/panic attack.   10/31/2019 She has not had any recent episodes of bronchitis. No recent antibiotics or prednisone.  She is on Neal, currently this is affordable for her. She is compliant with this. She uses Duoneb 3-4 times a day as instructed. She did not try dropping Duoneb from scheduled 4 times a day to as needed.She gets shortness when walking. Takes her a few minutes to recover. She does not use CPAP machine for OSA. She had split night sleep study. She could not tolerate CPAP d/t claustrophobia. Discussed alternative treatment options including weight loss.    Allergies  Allergen Reactions  . Tramadol Hcl Nausea And Vomiting and Other (See Comments)     mouth dryness, headache  . Codeine Nausea And Vomiting and Nausea Only  . Tape Rash    Plastic tape, bandaids and ekg leads, causes redness and rash  . Vicodin [Hydrocodone-Acetaminophen] Nausea And Vomiting    Immunization History  Administered Date(s) Administered  . Fluad Quad(high Dose 65+) 02/09/2019  . Influenza, High Dose Seasonal PF 03/23/2018  . Influenza,inj,Quad PF,6+ Mos 03/01/2013, 02/14/2014, 03/08/2015, 04/16/2016, 01/12/2017  . Influenza-Unspecified 01/18/2017  . Pneumococcal Conjugate-13 03/30/2013  . Pneumococcal Polysaccharide-23 04/04/2014  . Tdap 09/24/2010    Past Medical History:  Diagnosis Date  . ALLERGIC RHINITIS 08/16/2008  . ANXIETY 08/16/2008  . Arthritis    hands, knees, lower back  . ASTHMA 08/16/2008  . Asthma    BRONCHITIS     DR. Lamonte Sakai    . Bladder leak     . Cancer ([place])    MELANOMA      . Chronic lower back pain    problems with disc L2-5  . COPD 08/16/2008  . DEPRESSION 08/16/2008  . Diabetes mellitus without complication ([place])   . Dysrhythmia    hx AF  . Excessive daytime sleepiness 06/19/2015  . GENITAL HERPES 12/03/2006  . GERD (gastroesophageal reflux disease)   . H/O hiatal hernia   . HEMATOCHEZIA 06/20/2009  . Hemorrhoids   . History of cardioversion 10/30/14  . History of kidney stones   . HYPERLIPIDEMIA 08/16/2008  . HYPERTENSION 12/03/2006  . Hypertension   . Impaired glucose tolerance 09/21/2010  . Overactive bladder 10/20/2015  . Pneumonia    as a child  . Pulmonary HTN ([place]) 06/19/2015  . Shortness of breath dyspnea   . Sleep apnea    MILD NO CPAP ORDERED  . Snoring 06/19/2015    Tobacco History: Social History   Tobacco Use  Smoking Status Former Smoker  . Packs/day: 1.50  . Years: 30.00  . Pack years: 45.00  . Types: Cigarettes  . Quit date: 04/21/2003  . Years since quitting: 16.5  Smokeless Tobacco Never Used   Counseling given: Not Answered   Outpatient Medications Prior to Visit  Medication Sig Dispense Refill  . albuterol (VENTOLIN HFA) 108 (90 Base) MCG/ACT inhaler Inhale 2 puffs by mouth every 6 hours as needed for wheezing or shortness of breath. 18 g 5  . Blood Glucose Monitoring Suppl (FREESTYLE  LITE) DEVI Use as directed daily E11.9 1 each 0  . Budeson-Glycopyrrol-Formoterol (BREZTRI AEROSPHERE) 160-9-4.8 MCG/ACT AERO Inhale 2 puffs into the lungs 2 (two) times daily. 5.9 g 0  . cetirizine (ZYRTEC) 10 MG tablet Take 10 mg by mouth daily.     . Cholecalciferol (VITAMIN D) 2000 units tablet Take 2,000 Units by mouth daily.    . cyclobenzaprine (FLEXERIL) 5 MG tablet Take 1 tablet (5 mg total) by mouth 3 (three) times daily as needed for muscle spasms. 40 tablet 3  . dimenhyDRINATE (DRAMAMINE) 50 MG tablet Take 50 mg by mouth daily as needed for nausea.    . diphenoxylate-atropine (LOMOTIL) 2.5-0.025 MG  tablet Take 1 tablet by mouth 4 (four) times daily as needed for diarrhea or loose stools. 40 tablet 0  . ELIQUIS 5 MG TABS tablet Take 1 tablet by mouth twice daily 60 tablet 5  . escitalopram (LEXAPRO) 20 MG tablet Take 1 tablet by mouth once daily 90 tablet 3  . estradiol (ESTRACE) 1 MG tablet Take 1 mg by mouth daily.    Arna Medici 25 MCG tablet TAKE 1 TABLET BY MOUTH ONCE DAILY BEFORE BREAKFAST 90 tablet 0  . furosemide (LASIX) 40 MG tablet Take 1 tablet by mouth once daily 90 tablet 1  . glucose blood (FREESTYLE LITE) test strip Use as instructed once daily E11.9 100 each 12  . guaiFENesin (MUCINEX) 600 MG 12 hr tablet Take 1 tablet (600 mg total) by mouth 2 (two) times daily as needed for cough or to loosen phlegm. 60 tablet 2  . ipratropium-albuterol (DUONEB) 0.5-2.5 (3) MG/3ML SOLN USE 1 AMPULE IN NEBULIZER 4 TIMES DAILY 360 mL 12  . irbesartan (AVAPRO) 150 MG tablet Take 1 tablet by mouth once daily 90 tablet 2  . Lancets MISC Use as directed once daily E11.9 100 each 11  . LORazepam (ATIVAN) 1 MG tablet Take 1 tablet by mouth twice daily as needed for anxiety 60 tablet 5  . metFORMIN (GLUCOPHAGE) 500 MG tablet Take 2 tablets (1,000 mg total) by mouth daily with breakfast. 180 tablet 1  . metFORMIN (GLUCOPHAGE-XR) 500 MG 24 hr tablet Take 2 tablets (1,000 mg total) by mouth daily with breakfast. 180 tablet 3  . Omega-3 Fatty Acids (FISH OIL) 1200 MG CAPS Take 1,200 mg by mouth daily.     . pantoprazole (PROTONIX) 40 MG tablet Take 1 tablet by mouth once daily 90 tablet 2  . potassium chloride SA (K-DUR,KLOR-CON) 20 MEQ tablet Take 1 tablet (20 mEq total) by mouth daily. 90 tablet 3  . pravastatin (PRAVACHOL) 40 MG tablet Take 1 tablet by mouth once daily 90 tablet 0  . Tetrahydrozoline HCl (VISINE OP) Place 1 drop into both eyes daily as needed (dry eyes).    . vitamin E 400 UNIT capsule Take 400 Units by mouth daily.    . metoprolol tartrate (LOPRESSOR) 100 MG tablet Take 1 tablet (100  mg total) by mouth 2 (two) times daily. 180 tablet 3   No facility-administered medications prior to visit.   Review of Systems  Review of Systems  Constitutional: Negative.   HENT: Negative for congestion and postnasal drip.   Respiratory: Positive for wheezing. Negative for cough, choking, chest tightness and stridor.        Dyspnea on exertion  Cardiovascular: Positive for leg swelling.    Physical Exam  BP 140/86 (BP Location: Left Arm, Cuff Size: Normal)   Pulse 94   Temp 97.9 F (36.6 C) (  Oral)   Ht 5' 2"  (1.575 m)   Wt 277 lb (125.6 kg)   SpO2 95%   BMI 50.66 kg/m  Physical Exam Constitutional:      Appearance: Normal appearance.  HENT:     Head: Normocephalic and atraumatic.  Cardiovascular:     Rate and Rhythm: Normal rate and regular rhythm.     Comments: + 2 pedal edema Pulmonary:     Effort: Pulmonary effort is normal.     Breath sounds: Normal breath sounds. No wheezing, rhonchi or rales.     Comments: CTA, no overt wheezing Musculoskeletal:        General: Normal range of motion.  Neurological:     General: No focal deficit present.     Mental Status: She is alert and oriented to person, place, and time. Mental status is at baseline.  Psychiatric:        Mood and Affect: Mood normal.        Behavior: Behavior normal.        Thought Content: Thought content normal.        Judgment: Judgment normal.      Lab Results:  CBC    Component Value Date/Time   WBC 9.9 03/28/2019 0912   RBC 3.97 03/28/2019 0912   HGB 12.0 03/28/2019 0912   HCT 38.6 03/28/2019 0912   PLT 322 03/28/2019 0912   MCV 97.2 03/28/2019 0912   MCH 30.2 03/28/2019 0912   MCHC 31.1 03/28/2019 0912   RDW 14.7 03/28/2019 0912   LYMPHSABS 3.0 02/09/2019 1445   MONOABS 0.8 02/09/2019 1445   EOSABS 0.2 02/09/2019 1445   BASOSABS 0.1 02/09/2019 1445    BMET    Component Value Date/Time   NA 137 09/27/2019 1456   NA 143 09/08/2016 1427   K 3.9 09/27/2019 1456   CL 97  09/27/2019 1456   CO2 31 09/27/2019 1456   GLUCOSE 140 (H) 09/27/2019 1456   BUN 16 09/27/2019 1456   BUN 11 09/08/2016 1427   CREATININE 0.87 09/27/2019 1456   CALCIUM 8.1 (L) 09/27/2019 1456   GFRNONAA >60 03/28/2019 0912   GFRAA >60 03/28/2019 0912    BNP No results found for: BNP  ProBNP    Component Value Date/Time   PROBNP 1,611 (H) 02/09/2019 1445    Imaging: No results found.   Assessment & Plan:   COPD (chronic obstructive pulmonary disease) - Appears stable; No recent exacerbations and lungs clear today  - Continue Breztri two puffs twice daily (rinse mouth after use) - Recommend try using Duoneb as needed only q6 hours for sob/wheezing and see how you do with this  - Needs order placed with DME company for new nebulizer supplies   Mild obstructive sleep apnea - Patient did not tolerate NPSG study in February 2021 well. Split night criteria was not met d/t insufficient AHI. No supplement oxygen was needed. She did not tolerate CPAP.  - Encourage weight loss and wedge pillow at night while sleeping   Martyn Ehrich, NP 10/31/2019

## 2019-10-31 NOTE — Assessment & Plan Note (Addendum)
-  Patient did not tolerate NPSG study in February 2021 well. Split night criteria was not met d/t insufficient AHI. No supplement oxygen was needed. She did not tolerate CPAP.  - Encourage weight loss and wedge pillow at night while sleeping

## 2019-11-06 ENCOUNTER — Other Ambulatory Visit: Payer: Self-pay | Admitting: Internal Medicine

## 2019-11-06 ENCOUNTER — Other Ambulatory Visit: Payer: Self-pay | Admitting: Cardiovascular Disease

## 2019-11-06 NOTE — Telephone Encounter (Signed)
Please refill as per office routine med refill policy (all routine meds refilled for 3 mo or monthly per pt preference up to one year from last visit, then month to month grace period for 3 mo, then further med refills will have to be denied)  

## 2019-11-08 ENCOUNTER — Other Ambulatory Visit: Payer: Self-pay | Admitting: Cardiovascular Disease

## 2019-11-09 NOTE — Telephone Encounter (Signed)
Patient is suppose to be taking Metoprolol 100 mg BID per [name] Gerhardt's last office note (07/12/19), when it was increased to 100 mg BID. Patient was to continue with her current medications at Dr. Johnsie Cancel office visit  (09/20/19). Called patient and confirmed that she has been taking Metoprolol 100 mg BID. Sent in refill for Metoprolol 100 mg BID.

## 2019-12-06 DIAGNOSIS — D1721 Benign lipomatous neoplasm of skin and subcutaneous tissue of right arm: Secondary | ICD-10-CM | POA: Diagnosis not present

## 2019-12-06 DIAGNOSIS — L821 Other seborrheic keratosis: Secondary | ICD-10-CM | POA: Diagnosis not present

## 2019-12-06 DIAGNOSIS — D225 Melanocytic nevi of trunk: Secondary | ICD-10-CM | POA: Diagnosis not present

## 2019-12-06 DIAGNOSIS — L720 Epidermal cyst: Secondary | ICD-10-CM | POA: Diagnosis not present

## 2019-12-14 DIAGNOSIS — H25812 Combined forms of age-related cataract, left eye: Secondary | ICD-10-CM | POA: Diagnosis not present

## 2019-12-14 DIAGNOSIS — H2511 Age-related nuclear cataract, right eye: Secondary | ICD-10-CM | POA: Diagnosis not present

## 2020-01-01 DIAGNOSIS — Z0120 Encounter for dental examination and cleaning without abnormal findings: Secondary | ICD-10-CM | POA: Diagnosis not present

## 2020-01-05 ENCOUNTER — Other Ambulatory Visit: Payer: Self-pay | Admitting: Internal Medicine

## 2020-01-05 NOTE — Telephone Encounter (Signed)
Please refill as per office routine med refill policy (all routine meds refilled for 3 mo or monthly per pt preference up to one year from last visit, then month to month grace period for 3 mo, then further med refills will have to be denied)  

## 2020-01-22 ENCOUNTER — Other Ambulatory Visit: Payer: Self-pay | Admitting: Internal Medicine

## 2020-01-25 ENCOUNTER — Institutional Professional Consult (permissible substitution): Payer: Medicare Other | Admitting: Plastic Surgery

## 2020-02-09 ENCOUNTER — Telehealth: Payer: Self-pay | Admitting: Emergency Medicine

## 2020-02-09 ENCOUNTER — Other Ambulatory Visit: Payer: Self-pay | Admitting: Internal Medicine

## 2020-02-09 NOTE — Telephone Encounter (Signed)
Please refill as per office routine med refill policy (all routine meds refilled for 3 mo or monthly per pt preference up to one year from last visit, then month to month grace period for 3 mo, then further med refills will have to be denied)  

## 2020-02-09 NOTE — Telephone Encounter (Signed)
PA request was received from (pharmacy): [facility] Phone:[phone] Fax: [phone] Medication name and strength:  BREZTRI 160-9-4.8MCG Ordering Provider: Baltazar Apo  Was PA started with CMM?: YES If yes, please enter KEY: [id number] Medication tried and failed: DULERA, SYMBICORT, ADVAIR Covered Alternatives: NA  PA sent to plan, time frame for approval / denial: 24-72 Routing to  [facility] for follow-up

## 2020-02-13 NOTE — Telephone Encounter (Signed)
Pt pa for breztri 160-9-4.8 mcg was approved effective 02/09/2020 through 02/08/2021

## 2020-02-25 ENCOUNTER — Other Ambulatory Visit: Payer: Self-pay | Admitting: Internal Medicine

## 2020-03-04 ENCOUNTER — Other Ambulatory Visit: Payer: Self-pay | Admitting: Emergency Medicine

## 2020-03-04 DIAGNOSIS — R053 Chronic cough: Secondary | ICD-10-CM

## 2020-03-06 ENCOUNTER — Telehealth: Payer: Self-pay | Admitting: Emergency Medicine

## 2020-03-06 MED ORDER — IPRATROPIUM BROMIDE 0.02 % IN SOLN
0.5000 mg | Freq: Four times a day (QID) | RESPIRATORY_TRACT | 12 refills | Status: DC
Start: 2020-03-06 — End: 2021-04-17

## 2020-03-06 MED ORDER — ALBUTEROL SULFATE (2.5 MG/3ML) 0.083% IN NEBU
INHALATION_SOLUTION | RESPIRATORY_TRACT | 12 refills | Status: DC
Start: 2020-03-06 — End: 2020-03-12

## 2020-03-06 MED ORDER — IPRATROPIUM BROMIDE 0.02 % IN SOLN
RESPIRATORY_TRACT | 12 refills | Status: DC
Start: 2020-03-06 — End: 2020-03-06

## 2020-03-06 MED ORDER — ALBUTEROL SULFATE (2.5 MG/3ML) 0.083% IN NEBU
INHALATION_SOLUTION | RESPIRATORY_TRACT | 12 refills | Status: DC
Start: 2020-03-06 — End: 2020-03-06

## 2020-03-06 NOTE — Telephone Encounter (Signed)
Called and spoke with patient, verified that patient needs a new script sent to the pharmacy for Ipratropium-albuterol (Duoneb).  Advised I would send to the pharmacy, verified pharmacy with patient.  As I was entering script, received a pop up box that it was not on her insurance formulary.  Entered the medications separately and it was accepted that way.  I called the patient and had to leave a vm.  I left a detailed message that the nebulizer medication would be two different prescriptions and she would have to mix them together in the nebulizer since her insurance would not cover the duoneb.  Advised to call with any questions.

## 2020-03-12 ENCOUNTER — Telehealth: Payer: Self-pay | Admitting: Emergency Medicine

## 2020-03-12 MED ORDER — ALBUTEROL SULFATE (2.5 MG/3ML) 0.083% IN NEBU: INHALATION_SOLUTION | RESPIRATORY_TRACT | 12 refills | Status: DC

## 2020-03-12 NOTE — Telephone Encounter (Signed)
Spoke with pt. States that Suzie Portela is having an issue getting Albuterol nebs. Advised pt that we can send the prescription to another pharmacy if she would like to see if it's just an issue with [facility]. Pt agreed, rx has been sent to [facility]. Nothing further was needed.

## 2020-03-21 ENCOUNTER — Other Ambulatory Visit: Payer: Self-pay | Admitting: Internal Medicine

## 2020-03-21 NOTE — Progress Notes (Deleted)
CARDIOLOGY OFFICE NOTE  Date:  03/21/2020    Andrea Perry Date of Birth: 1950/09/14 Medical Record #[id number]  PCP:  Biagio Borg, MD  Cardiologist:  Gillian Shields  No chief complaint on file.   History of Present Illness: Andrea Perry is a 69 y.o. female with a  history of diastolic dysfunction, DM, HTN, HLD and long standing symptomatic PAF - on anticoagulation  No history of CAD   - last Myoview from 2016 was normal.  - Echo 05/24/19 EF 55-60% normal atrial sizes normal RV no significant valve dx  Has failed [place] x 2 as well as flecainide She did not want to pursue hospitalization for Tikosyn or ablation and has been rate controlled on eliquis  Had weight gain and edema on Norvasc Improved when stopped .  ? Dysphagia but normal swallowing study October 2021   No issues with afib No palpitations, syncope dyspnea or chest pain. Compliant with eliquis and no bleeding issues   ***   Past Medical History:  Diagnosis Date  . ALLERGIC RHINITIS 08/16/2008  . ANXIETY 08/16/2008  . Arthritis    hands, knees, lower back  . ASTHMA 08/16/2008  . Asthma    BRONCHITIS     DR. Lamonte Sakai    . Bladder leak   . Cancer ([place])    MELANOMA      . Chronic lower back pain    problems with disc L2-5  . COPD 08/16/2008  . DEPRESSION 08/16/2008  . Diabetes mellitus without complication ([place])   . Dysrhythmia    hx AF  . Excessive daytime sleepiness 06/19/2015  . GENITAL HERPES 12/03/2006  . GERD (gastroesophageal reflux disease)   . H/O hiatal hernia   . HEMATOCHEZIA 06/20/2009  . Hemorrhoids   . History of cardioversion 10/30/14  . History of kidney stones   . HYPERLIPIDEMIA 08/16/2008  . HYPERTENSION 12/03/2006  . Hypertension   . Impaired glucose tolerance 09/21/2010  . Overactive bladder 10/20/2015  . Pneumonia    as a child  . Pulmonary HTN ([place]) 06/19/2015  . Shortness of breath dyspnea   . Sleep apnea    MILD NO CPAP ORDERED  . Snoring 06/19/2015    Past Surgical History:   Procedure Laterality Date  . ABDOMINAL HYSTERECTOMY    . APPENDECTOMY    . CARDIOVERSION N/A 03/20/2014   Procedure: CARDIOVERSION;  Surgeon: Josue Hector, MD;  Location: [facility] ENDOSCOPY;  Service: Cardiovascular;  Laterality: N/A;  . CARDIOVERSION N/A 10/30/2014   Procedure: CARDIOVERSION;  Surgeon: Josue Hector, MD;  Location: [facility] ENDOSCOPY;  Service: Cardiovascular;  Laterality: N/A;  . CARDIOVERSION N/A 04/03/2019   Procedure: CARDIOVERSION;  Surgeon: Dorothy Spark, MD;  Location: [facility] ENDOSCOPY;  Service: Cardiovascular;  Laterality: N/A;  . CARPAL TUNNEL RELEASE     Right + LEFT  . CESAREAN SECTION     x 1   . COLONOSCOPY  2004  . CYST EXCISION     RT ARM   . CYSTOCELE REPAIR N/A 10/25/2012   Procedure: ANTERIOR REPAIR (CYSTOCELE);  Surgeon: Gus Height, MD;  Location: [facility];  Service: Gynecology;  Laterality: N/A;  . HEEL SPUR SURGERY Bilateral   . KNEE SURGERY     Right + LEFT  . LUMBAR LAMINECTOMY/DECOMPRESSION MICRODISCECTOMY N/A 03/19/2016   Procedure: Laminectomy and Foraminotomy - Lumbar three-four - Lumbar four-five;  Surgeon: Eustace Moore, MD;  Location: [place];  Service: Neurosurgery;  Laterality: N/A;  . RADIOLOGY WITH  ANESTHESIA Right 11/01/2014   Procedure: MRI RIGHT FOREARM;  Surgeon: Medication Radiologist, MD;  Location: [place];  Service: Radiology;  Laterality: Right;  . RADIOLOGY WITH ANESTHESIA Right 08/29/2015   Procedure: MRI - RIGHT FOREARM;  Surgeon: Medication Radiologist, MD;  Location: [place];  Service: Radiology;  Laterality: Right;  . RADIOLOGY WITH ANESTHESIA N/A 12/05/2015   Procedure: MRI SPINE WITHOUT;  Surgeon: Medication Radiologist, MD;  Location: [place];  Service: Radiology;  Laterality: N/A;  . SKIN CANCER EXCISION     BILAT SHOULDERS  . SPLIT NIGHT STUDY  09/04/2015  . TONSILLECTOMY AND ADENOIDECTOMY       Medications: No outpatient medications have been marked as taking for the 03/25/20 encounter (Appointment) with Josue Hector, MD.     Allergies: Allergies  Allergen Reactions  . Tramadol Hcl Nausea And Vomiting and Other (See Comments)     mouth dryness, headache  . Codeine Nausea And Vomiting and Nausea Only  . Tape Rash    Plastic tape, bandaids and ekg leads, causes redness and rash  . Vicodin [Hydrocodone-Acetaminophen] Nausea And Vomiting    Social History: The patient  reports that she quit smoking about 16 years ago. Her smoking use included cigarettes. She has a 45.00 pack-year smoking history. She has never used smokeless tobacco. She reports current alcohol use of about 2.0 standard drinks of alcohol per week. She reports that she does not use drugs.   Family History: The patient's family history includes Colon polyps in her mother; Diabetes in her mother; Hyperlipidemia in her mother; Hypertension in her mother; Stroke in her father.   Review of Systems: Please see the history of present illness.   All other systems are reviewed and negative.   Physical Exam: VS:  There were no vitals taken for this visit. Marland Kitchen  BMI There is no height or weight on file to calculate BMI.  Wt Readings from Last 3 Encounters:  10/31/19 125.6 kg  09/27/19 123.4 kg  09/20/19 124.7 kg   Affect appropriate Healthy:  appears stated age 6: normal Neck supple with no adenopathy JVP normal no bruits no thyromegaly Lungs clear with no wheezing and good diaphragmatic motion Heart:  S1/S2 no murmur, no rub, gallop or click PMI normal Abdomen: benighn, BS positve, no tenderness, no AAA no bruit.  No HSM or HJR Distal pulses intact with no bruits No edema Neuro non-focal Skin warm and dry No muscular weakness     LABORATORY DATA:  EKG:   06/06/19 afib rate 99 nonspecific ST changes   Lab Results  Component Value Date   WBC 9.9 03/28/2019   HGB 12.0 03/28/2019   HCT 38.6 03/28/2019   PLT 322 03/28/2019   GLUCOSE 140 (H) 09/27/2019   CHOL 155 09/27/2019   TRIG 270.0 (H) 09/27/2019   HDL 38.20 (L)  09/27/2019   LDLDIRECT 87.0 09/27/2019   LDLCALC 87 03/23/2018   ALT 15 09/27/2019   AST 19 09/27/2019   NA 137 09/27/2019   K 3.9 09/27/2019   CL 97 09/27/2019   CREATININE 0.87 09/27/2019   BUN 16 09/27/2019   CO2 31 09/27/2019   TSH 7.12 (H) 03/23/2018   INR 1.00 03/18/2016   HGBA1C 7.7 (H) 09/27/2019   MICROALBUR 96.6 (H) 03/17/2017     BNP (last 3 results) No results for input(s): BNP in the last 8760 hours.  ProBNP (last 3 results) No results for input(s): PROBNP in the last 8760 hours.   Other Studies Reviewed Today:  ECHO IMPRESSIONS 05/2019  1. Left ventricular ejection fraction, by visual estimation, is 55 to  60%. The left ventricle has normal function. There is no left ventricular  hypertrophy.  2. Left ventricular diastolic parameters are indeterminate.  3. Global right ventricle has normal systolic function.The right  ventricular size is normal.  4. Left atrial size was normal.  5. Right atrial size was normal.  6. Mild mitral annular calcification.  7. The mitral valve is normal in structure. No evidence of mitral valve  regurgitation.  8. The tricuspid valve is normal in structure.  9. The tricuspid valve is normal in structure. Tricuspid valve  regurgitation is not demonstrated.  10. The aortic valve was not well visualized. Aortic valve regurgitation  is not visualized. No evidence of aortic valve stenosis.  11. The pulmonic valve was not well visualized. Pulmonic valve  regurgitation is not visualized.  12. The inferior vena cava is dilated in size with <50% respiratory  variability, suggesting right atrial pressure of 15 mmHg.  13. TR signal is inadequate for assessing pulmonary artery systolic  pressure.     Procedure: DC Cardioversion 03/2019 Indications:atrial fibrillation  Procedure Details Consent:Obtained Time XAJ:OINOMVEH patient identification, verified procedure, site/side was marked, verified correct patient  position, special equipment/implants available, Radiology Safety Procedures followed, medications/allergies/relevent history reviewed, required imaging and test results available. Performed  The patient has been on adequate anticoagulation. The patient received IV propofol administered by anesthesia staff for deep sedation. Synchronous cardioversion was performed at 120joules.  The cardioversion wassuccessful.   Complications:No apparent complications Patientdidtolerate procedure well.   [name], MD, [facility] 04/03/2019,10:38 AM   ASSESSMENT &PLAN:    1 Persistent AF - rate control and anticoagulation  Continue eliquis and lopressor   2. HTN - less edema off norvasc improved   3. Dysphagia - swallowing study 02/02/20 normal f/u with GI   4. Probable diastolic dysfunction - she has good BP control - weight trending down. On daily lasix and Kdur   5. HLD -  On pravastatin labs with primary LDL 87 at goal with no known vascular dx   Current medicines are reviewed with the patient today.  The patient does not have concerns regarding medicines other than what has been noted above.  The following changes have been made:  See above.  Labs/ tests ordered today include:  None    No orders of the defined types were placed in this encounter.    Disposition:   F/U in a year  Patient is agreeable to this plan and will call if any problems develop in the interim.   Signed: Jenkins Rouge, MD  03/21/2020 5:17 PM  [facility] [address] [place], [place]  [address] Phone: [phone] Fax: [phone]

## 2020-03-25 ENCOUNTER — Ambulatory Visit: Payer: Medicare Other | Admitting: Cardiovascular Disease

## 2020-04-05 ENCOUNTER — Other Ambulatory Visit: Payer: Self-pay | Admitting: Cardiovascular Disease

## 2020-04-05 NOTE — Telephone Encounter (Signed)
Pt last saw Dr Johnsie Cancel 09/20/19, last labs 09/27/19 Creat 0.87, age 69, weight 125.6kg, based on specified criteria pt is on appropriate dosage of Eliquis 5mg  BID.  Will refill rx.

## 2020-04-08 ENCOUNTER — Other Ambulatory Visit: Payer: Self-pay | Admitting: Internal Medicine

## 2020-04-08 NOTE — Telephone Encounter (Signed)
Please refill as per office routine med refill policy (all routine meds refilled for 3 mo or monthly per pt preference up to one year from last visit, then month to month grace period for 3 mo, then further med refills will have to be denied)  

## 2020-04-15 ENCOUNTER — Other Ambulatory Visit: Payer: Self-pay | Admitting: Internal Medicine

## 2020-05-09 ENCOUNTER — Other Ambulatory Visit: Payer: Self-pay | Admitting: Internal Medicine

## 2020-05-09 ENCOUNTER — Encounter: Payer: Self-pay | Admitting: Internal Medicine

## 2020-05-09 ENCOUNTER — Telehealth (INDEPENDENT_AMBULATORY_CARE_PROVIDER_SITE_OTHER): Payer: Medicare Other | Admitting: Internal Medicine

## 2020-05-09 ENCOUNTER — Other Ambulatory Visit: Payer: Self-pay | Admitting: Pulmonary Disease

## 2020-05-09 DIAGNOSIS — Z20822 Contact with and (suspected) exposure to covid-19: Secondary | ICD-10-CM | POA: Diagnosis not present

## 2020-05-09 MED ORDER — DOXYCYCLINE HYCLATE 100 MG PO TABS: 100.0000 mg | ORAL_TABLET | Freq: Two times a day (BID) | ORAL | 0 refills | Status: DC

## 2020-05-09 MED ORDER — PROMETHAZINE-DM 6.25-15 MG/5ML PO SYRP
5.0000 mL | ORAL_SOLUTION | Freq: Four times a day (QID) | ORAL | 0 refills | Status: DC | PRN
Start: 2020-05-09 — End: 2020-06-13

## 2020-05-09 MED ORDER — PREDNISONE 20 MG PO TABS
40.0000 mg | ORAL_TABLET | Freq: Every day | ORAL | 0 refills | Status: AC
Start: 2020-05-09 — End: 2020-05-16

## 2020-05-09 NOTE — Telephone Encounter (Signed)
Please refill as per office routine med refill policy (all routine meds refilled for 3 mo or monthly per pt preference up to one year from last visit, then month to month grace period for 3 mo, then further med refills will have to be denied)  

## 2020-05-09 NOTE — Progress Notes (Signed)
Virtual Visit via Video Note  I connected with [place] on 05/09/20 at  3:20 PM EST by a video enabled telemedicine application and verified that I am speaking with the correct person using two identifiers.  The patient and the provider were at separate locations throughout the entire encounter. Patient location: home, Provider location: work   I discussed the limitations of evaluation and management by telemedicine and the availability of in person appointments. The patient expressed understanding and agreed to proceed. The patient and the provider were the only parties present for the visit unless noted in HPI below.  History of Present Illness: The patient is a 70 y.o. female with visit for cough and SOB. Started couple days ago. Has cough and SOB. 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 Taking breztri daily and has not missed this recently. Using nebulizer more often in the last few days and this is helping some but not as well as usual. Around a lot of sick contacts in her household. She states they are vaccinated against covid-19 but to her knowledge none have been tested for covid-19. She has not been tested for covid-19 since symptom onset. She states she has been tested several times in the past and never had it (last time was last year sometime) and does not want to be tested again at this time. Denies fevers or chills. Overall it is worsening. Has tried her nebulizer and inhalers. Has not been vaccinated against covid-19.   Observations/Objective: Appearance: chronically ill appearing, breathing appears slightly SOB with long sentences but talking in complete sentences, casual grooming, abdomen does not appear distended, throat not well visualized, mental status is A and O times 3  Assessment and Plan: See problem oriented charting  Follow Up Instructions: encouraged to get tested for covid-19 as she is high risk for complications and unvaccinated but she declines.  Advised to continue breztri and inhalers. Rx prednisone and doxycycline and promethazine/dm cough syrup. If no improvement needs covid-19 testing and talked about interval of 1 week since start of symptoms for preventative treatment for her consideration. If SOB worsening or nebulizer not working seek care at urgent care or ER.  Visit time 15 minutes in face to face communication with patient and coordination of care, additional 15 minutes spent in record review, coordination or care, ordering tests, communicating/referring to other healthcare professionals, documenting in medical records all on the same day of the visit for total time 30 minutes spent on the visit.    I discussed the assessment and treatment plan with the patient. The patient was provided an opportunity to ask questions and all were answered. The patient agreed with the plan and demonstrated an understanding of the instructions.   The patient was advised to call back or seek an in-person evaluation if the symptoms worsen or if the condition fails to improve as anticipated.  Hoyt Koch, MD

## 2020-05-09 NOTE — Assessment & Plan Note (Signed)
She is advised to quarantine per CDC guidelines since she does not desire to be tested. She is strongly encouraged to get tested as she is high risk for complications and unvaccinated but she declines. Continue breztri and nebulizer. Add prednisone and doxycycline and promethazine/dm cough syrup all of which are prescribed today. Advised on when to seek care in urgent care or ER.

## 2020-05-13 NOTE — Progress Notes (Incomplete)
CARDIOLOGY OFFICE NOTE  Date:  05/13/2020    [place] Date of Birth: July 29, 1950 Medical Record #[id number]  PCP:  Biagio Borg, MD  Cardiologist:  Gillian Shields  No chief complaint on file.   History of Present Illness: Andrea Perry is a 70 y.o. female with a  history of diastolic dysfunction, DM, HTN, HLD and long standing symptomatic PAF - on anticoagulation  No history of CAD   - last Myoview from 2016 was normal.  - Echo 05/24/19 EF 55-60% normal atrial sizes normal RV no significant valve dx  Has failed Timnath x 2 as well as flecainide She did not want to pursue hospitalization for Tikosyn or ablation and has been rate controlled on eliquis  Had weight gain and edema on Norvasc Improved when stopped . Complained to NP of some dysphagia on last visit  Seems to have clinical aspiration with coughing after eating frequently Swallowing study ok 09/27/19   No issues with afib No palpitations, syncope dyspnea or chest pain. Compliant with eliquis and no bleeding issues   Has COPD with cough Using Breztri and nebulizer 05/09/20 Rx doxycycline by primary Has tested negative for COVID In past ***  ***  Past Medical History:  Diagnosis Date  . ALLERGIC RHINITIS 08/16/2008  . ANXIETY 08/16/2008  . Arthritis    hands, knees, lower back  . ASTHMA 08/16/2008  . Asthma    BRONCHITIS     DR. Lamonte Sakai    . Bladder leak   . Cancer ([place])    MELANOMA      . Chronic lower back pain    problems with disc L2-5  . COPD 08/16/2008  . DEPRESSION 08/16/2008  . Diabetes mellitus without complication ([place])   . Dysrhythmia    hx AF  . Excessive daytime sleepiness 06/19/2015  . GENITAL HERPES 12/03/2006  . GERD (gastroesophageal reflux disease)   . H/O hiatal hernia   . HEMATOCHEZIA 06/20/2009  . Hemorrhoids   . History of cardioversion 10/30/14  . History of kidney stones   . HYPERLIPIDEMIA 08/16/2008  . HYPERTENSION 12/03/2006  . Hypertension   . Impaired glucose tolerance 09/21/2010   . Overactive bladder 10/20/2015  . Pneumonia    as a child  . Pulmonary HTN ([place]) 06/19/2015  . Shortness of breath dyspnea   . Sleep apnea    MILD NO CPAP ORDERED  . Snoring 06/19/2015    Past Surgical History:  Procedure Laterality Date  . ABDOMINAL HYSTERECTOMY    . APPENDECTOMY    . CARDIOVERSION N/A 03/20/2014   Procedure: CARDIOVERSION;  Surgeon: Josue Hector, MD;  Location: [facility] ENDOSCOPY;  Service: Cardiovascular;  Laterality: N/A;  . CARDIOVERSION N/A 10/30/2014   Procedure: CARDIOVERSION;  Surgeon: Josue Hector, MD;  Location: [facility] ENDOSCOPY;  Service: Cardiovascular;  Laterality: N/A;  . CARDIOVERSION N/A 04/03/2019   Procedure: CARDIOVERSION;  Surgeon: Dorothy Spark, MD;  Location: [facility] ENDOSCOPY;  Service: Cardiovascular;  Laterality: N/A;  . CARPAL TUNNEL RELEASE     Right + LEFT  . CESAREAN SECTION     x 1   . COLONOSCOPY  2004  . CYST EXCISION     RT ARM   . CYSTOCELE REPAIR N/A 10/25/2012   Procedure: ANTERIOR REPAIR (CYSTOCELE);  Surgeon: Gus Height, MD;  Location: [facility];  Service: Gynecology;  Laterality: N/A;  . HEEL SPUR SURGERY Bilateral   . KNEE SURGERY     Right + LEFT  . LUMBAR  LAMINECTOMY/DECOMPRESSION MICRODISCECTOMY N/A 03/19/2016   Procedure: Laminectomy and Foraminotomy - Lumbar three-four - Lumbar four-five;  Surgeon: Eustace Moore, MD;  Location: [place];  Service: Neurosurgery;  Laterality: N/A;  . RADIOLOGY WITH ANESTHESIA Right 11/01/2014   Procedure: MRI RIGHT FOREARM;  Surgeon: Medication Radiologist, MD;  Location: [place];  Service: Radiology;  Laterality: Right;  . RADIOLOGY WITH ANESTHESIA Right 08/29/2015   Procedure: MRI - RIGHT FOREARM;  Surgeon: Medication Radiologist, MD;  Location: [place];  Service: Radiology;  Laterality: Right;  . RADIOLOGY WITH ANESTHESIA N/A 12/05/2015   Procedure: MRI SPINE WITHOUT;  Surgeon: Medication Radiologist, MD;  Location: [place];  Service: Radiology;  Laterality: N/A;  . SKIN CANCER EXCISION     BILAT SHOULDERS   . SPLIT NIGHT STUDY  09/04/2015  . TONSILLECTOMY AND ADENOIDECTOMY       Medications: No outpatient medications have been marked as taking for the 05/17/20 encounter (Appointment) with Josue Hector, MD.    Allergies: Allergies  Allergen Reactions  . Tramadol Hcl Nausea And Vomiting and Other (See Comments)     mouth dryness, headache  . Codeine Nausea And Vomiting and Nausea Only  . Tape Rash    Plastic tape, bandaids and ekg leads, causes redness and rash  . Vicodin [Hydrocodone-Acetaminophen] Nausea And Vomiting    Social History: The patient  reports that she quit smoking about 17 years ago. Her smoking use included cigarettes. She has a 45.00 pack-year smoking history. She has never used smokeless tobacco. She reports current alcohol use of about 2.0 standard drinks of alcohol per week. She reports that she does not use drugs.   Family History: The patient's family history includes Colon polyps in her mother; Diabetes in her mother; Hyperlipidemia in her mother; Hypertension in her mother; Stroke in her father.   Review of Systems: Please see the history of present illness.   All other systems are reviewed and negative.   Physical Exam: VS:  There were no vitals taken for this visit. Marland Kitchen  BMI There is no height or weight on file to calculate BMI.  Wt Readings from Last 3 Encounters:  10/31/19 125.6 kg  09/27/19 123.4 kg  09/20/19 124.7 kg   Affect appropriate Overweight white female  HEENT: normal Neck supple with no adenopathy JVP normal no bruits no thyromegaly Lungs clear with no wheezing and good diaphragmatic motion Heart:  S1/S2 no murmur, no rub, gallop or click PMI normal Abdomen: benighn, BS positve, no tenderness, no AAA no bruit.  No HSM or HJR Distal pulses intact with no bruits No edema Neuro non-focal Skin warm and dry No muscular weakness    LABORATORY DATA:  EKG:   06/06/19 afib rate 99 nonspecific ST changes   Lab Results  Component  Value Date   WBC 9.9 03/28/2019   HGB 12.0 03/28/2019   HCT 38.6 03/28/2019   PLT 322 03/28/2019   GLUCOSE 140 (H) 09/27/2019   CHOL 155 09/27/2019   TRIG 270.0 (H) 09/27/2019   HDL 38.20 (L) 09/27/2019   LDLDIRECT 87.0 09/27/2019   LDLCALC 87 03/23/2018   ALT 15 09/27/2019   AST 19 09/27/2019   NA 137 09/27/2019   K 3.9 09/27/2019   CL 97 09/27/2019   CREATININE 0.87 09/27/2019   BUN 16 09/27/2019   CO2 31 09/27/2019   TSH 7.12 (H) 03/23/2018   INR 1.00 03/18/2016   HGBA1C 7.7 (H) 09/27/2019   MICROALBUR 96.6 (H) 03/17/2017     BNP (  last 3 results) No results for input(s): BNP in the last 8760 hours.  ProBNP (last 3 results) No results for input(s): PROBNP in the last 8760 hours.   Other Studies Reviewed Today:  ECHO IMPRESSIONS 05/2019  1. Left ventricular ejection fraction, by visual estimation, is 55 to  60%. The left ventricle has normal function. There is no left ventricular  hypertrophy.  2. Left ventricular diastolic parameters are indeterminate.  3. Global right ventricle has normal systolic function.The right  ventricular size is normal.  4. Left atrial size was normal.  5. Right atrial size was normal.  6. Mild mitral annular calcification.  7. The mitral valve is normal in structure. No evidence of mitral valve  regurgitation.  8. The tricuspid valve is normal in structure.  9. The tricuspid valve is normal in structure. Tricuspid valve  regurgitation is not demonstrated.  10. The aortic valve was not well visualized. Aortic valve regurgitation  is not visualized. No evidence of aortic valve stenosis.  11. The pulmonic valve was not well visualized. Pulmonic valve  regurgitation is not visualized.  12. The inferior vena cava is dilated in size with <50% respiratory  variability, suggesting right atrial pressure of 15 mmHg.  13. TR signal is inadequate for assessing pulmonary artery systolic  pressure.     Procedure: DC Cardioversion  03/2019 Indications:atrial fibrillation  Procedure Details Consent:Obtained Time YBO:FBPZWCHE patient identification, verified procedure, site/side was marked, verified correct patient position, special equipment/implants available, Radiology Safety Procedures followed, medications/allergies/relevent history reviewed, required imaging and test results available. Performed  The patient has been on adequate anticoagulation. The patient received IV propofol administered by anesthesia staff for deep sedation. Synchronous cardioversion was performed at 120joules.  The cardioversion wassuccessful.   Complications:No apparent complications Patientdidtolerate procedure well.   [name], MD, [facility] 04/03/2019,10:38 AM   ASSESSMENT &PLAN:    1 Persistent AF - has opted for rate control and continued anticoagulation with no plans for further attempts at restoration back to NSR. HR is fine today. Continue eliquis and lopressor   2. HTN - improved norvasc d/c due to swelling   3. Dysphagia -  Swallowing study normal 09/27/19   4. Probable diastolic dysfunction - she has good BP control - weight trending down. On daily lasix and Kdur   5. HLD -  On pravastatin labs with primary   6. COPD:  F/u primary ***   Current medicines are reviewed with the patient today.  The patient does not have concerns regarding medicines other than what has been noted above.  The following changes have been made:  See above.  Labs/ tests ordered today include: Swallowing Study    No orders of the defined types were placed in this encounter.    Disposition:   F/U in a year  Patient is agreeable to this plan and will call if any problems develop in the interim.   Signed: Jenkins Rouge, MD  05/13/2020 10:35 AM  [facility] [address] [place], [place]  [address] Phone: [phone] Fax: [phone]

## 2020-05-17 ENCOUNTER — Ambulatory Visit: Payer: Medicare Other | Admitting: Cardiovascular Disease

## 2020-05-20 ENCOUNTER — Other Ambulatory Visit: Payer: Self-pay | Admitting: Internal Medicine

## 2020-05-20 NOTE — Telephone Encounter (Signed)
Please refill as per office routine med refill policy (all routine meds refilled for 3 mo or monthly per pt preference up to one year from last visit, then month to month grace period for 3 mo, then further med refills will have to be denied)  

## 2020-05-21 ENCOUNTER — Inpatient Hospital Stay (HOSPITAL_COMMUNITY)
Admission: EM | Admit: 2020-05-21 | Discharge: 2020-05-25 | DRG: 177 | Disposition: A | Discharge status: Home Health | Payer: Medicare Other | Attending: Internal Medicine | Admitting: Internal Medicine

## 2020-05-21 ENCOUNTER — Other Ambulatory Visit: Payer: Self-pay

## 2020-05-21 ENCOUNTER — Emergency Department (HOSPITAL_COMMUNITY): Payer: Medicare Other

## 2020-05-21 DIAGNOSIS — E876 Hypokalemia: Secondary | ICD-10-CM | POA: Diagnosis not present

## 2020-05-21 DIAGNOSIS — A0839 Other viral enteritis: Secondary | ICD-10-CM | POA: Diagnosis present

## 2020-05-21 DIAGNOSIS — E039 Hypothyroidism, unspecified: Secondary | ICD-10-CM | POA: Diagnosis not present

## 2020-05-21 DIAGNOSIS — I110 Hypertensive heart disease with heart failure: Secondary | ICD-10-CM | POA: Diagnosis present

## 2020-05-21 DIAGNOSIS — Z87442 Personal history of urinary calculi: Secondary | ICD-10-CM | POA: Diagnosis not present

## 2020-05-21 DIAGNOSIS — K219 Gastro-esophageal reflux disease without esophagitis: Secondary | ICD-10-CM | POA: Diagnosis present

## 2020-05-21 DIAGNOSIS — Z87891 Personal history of nicotine dependence: Secondary | ICD-10-CM

## 2020-05-21 DIAGNOSIS — I509 Heart failure, unspecified: Secondary | ICD-10-CM | POA: Diagnosis present

## 2020-05-21 DIAGNOSIS — Z90710 Acquired absence of both cervix and uterus: Secondary | ICD-10-CM

## 2020-05-21 DIAGNOSIS — Z823 Family history of stroke: Secondary | ICD-10-CM | POA: Diagnosis not present

## 2020-05-21 DIAGNOSIS — R0602 Shortness of breath: Secondary | ICD-10-CM

## 2020-05-21 DIAGNOSIS — J440 Chronic obstructive pulmonary disease with acute lower respiratory infection: Secondary | ICD-10-CM | POA: Diagnosis not present

## 2020-05-21 DIAGNOSIS — Z85820 Personal history of malignant melanoma of skin: Secondary | ICD-10-CM

## 2020-05-21 DIAGNOSIS — J1282 Pneumonia due to coronavirus disease 2019: Secondary | ICD-10-CM | POA: Diagnosis not present

## 2020-05-21 DIAGNOSIS — I5031 Acute diastolic (congestive) heart failure: Secondary | ICD-10-CM | POA: Diagnosis not present

## 2020-05-21 DIAGNOSIS — I517 Cardiomegaly: Secondary | ICD-10-CM | POA: Diagnosis not present

## 2020-05-21 DIAGNOSIS — E1165 Type 2 diabetes mellitus with hyperglycemia: Secondary | ICD-10-CM | POA: Diagnosis present

## 2020-05-21 DIAGNOSIS — J9601 Acute respiratory failure with hypoxia: Secondary | ICD-10-CM | POA: Diagnosis not present

## 2020-05-21 DIAGNOSIS — I10 Essential (primary) hypertension: Secondary | ICD-10-CM | POA: Diagnosis not present

## 2020-05-21 DIAGNOSIS — U071 COVID-19: Principal | ICD-10-CM | POA: Diagnosis present

## 2020-05-21 DIAGNOSIS — Z8249 Family history of ischemic heart disease and other diseases of the circulatory system: Secondary | ICD-10-CM

## 2020-05-21 DIAGNOSIS — I482 Chronic atrial fibrillation, unspecified: Secondary | ICD-10-CM | POA: Diagnosis present

## 2020-05-21 DIAGNOSIS — R059 Cough, unspecified: Secondary | ICD-10-CM | POA: Diagnosis not present

## 2020-05-21 DIAGNOSIS — I272 Pulmonary hypertension, unspecified: Secondary | ICD-10-CM | POA: Diagnosis present

## 2020-05-21 DIAGNOSIS — E785 Hyperlipidemia, unspecified: Secondary | ICD-10-CM | POA: Diagnosis present

## 2020-05-21 DIAGNOSIS — Z7901 Long term (current) use of anticoagulants: Secondary | ICD-10-CM

## 2020-05-21 DIAGNOSIS — Z79899 Other long term (current) drug therapy: Secondary | ICD-10-CM

## 2020-05-21 DIAGNOSIS — Z6841 Body Mass Index (BMI) 40.0 and over, adult: Secondary | ICD-10-CM | POA: Diagnosis not present

## 2020-05-21 DIAGNOSIS — Z79890 Hormone replacement therapy (postmenopausal): Secondary | ICD-10-CM

## 2020-05-21 DIAGNOSIS — J811 Chronic pulmonary edema: Secondary | ICD-10-CM | POA: Diagnosis not present

## 2020-05-21 DIAGNOSIS — G4733 Obstructive sleep apnea (adult) (pediatric): Secondary | ICD-10-CM | POA: Diagnosis present

## 2020-05-21 DIAGNOSIS — I5033 Acute on chronic diastolic (congestive) heart failure: Secondary | ICD-10-CM | POA: Diagnosis not present

## 2020-05-21 DIAGNOSIS — Z83438 Family history of other disorder of lipoprotein metabolism and other lipidemia: Secondary | ICD-10-CM

## 2020-05-21 DIAGNOSIS — Z7984 Long term (current) use of oral hypoglycemic drugs: Secondary | ICD-10-CM

## 2020-05-21 DIAGNOSIS — Z833 Family history of diabetes mellitus: Secondary | ICD-10-CM

## 2020-05-21 DIAGNOSIS — I4891 Unspecified atrial fibrillation: Secondary | ICD-10-CM | POA: Diagnosis not present

## 2020-05-21 DIAGNOSIS — J9811 Atelectasis: Secondary | ICD-10-CM | POA: Diagnosis not present

## 2020-05-21 DIAGNOSIS — Z8371 Family history of colonic polyps: Secondary | ICD-10-CM

## 2020-05-21 LAB — BASIC METABOLIC PANEL
Anion gap: 18 — ABNORMAL HIGH (ref 5–15)
Anion gap: 15 (ref 5–15)
BUN: 6 mg/dL — ABNORMAL LOW (ref 8–23)
BUN: 6 mg/dL — ABNORMAL LOW (ref 8–23)
CO2: 27 mmol/L (ref 22–32)
CO2: 28 mmol/L (ref 22–32)
Calcium: 6.8 mg/dL — ABNORMAL LOW (ref 8.9–10.3)
Calcium: 6.6 mg/dL — ABNORMAL LOW (ref 8.9–10.3)
Chloride: 91 mmol/L — ABNORMAL LOW (ref 98–111)
Chloride: 92 mmol/L — ABNORMAL LOW (ref 98–111)
Creatinine, Ser: 0.88 mg/dL (ref 0.44–1.00)
Creatinine, Ser: 0.82 mg/dL (ref 0.44–1.00)
GFR, Estimated: >60 mL/min (ref >60)
GFR, Estimated: >60 mL/min (ref >60)
Glucose, Bld: 175 mg/dL — ABNORMAL HIGH (ref 70–99)
Glucose, Bld: 247 mg/dL — ABNORMAL HIGH (ref 70–99)
Potassium: 2.7 mmol/L — CL (ref 3.5–5.1)
Potassium: 3 mmol/L — ABNORMAL LOW (ref 3.5–5.1)
Sodium: 136 mmol/L (ref 135–145)
Sodium: 135 mmol/L (ref 135–145)

## 2020-05-21 LAB — TROPONIN I (HIGH SENSITIVITY)
Troponin I (High Sensitivity): 7 ng/L (ref <18)
Troponin I (High Sensitivity): 9 ng/L (ref <18)

## 2020-05-21 LAB — CBC WITH DIFFERENTIAL/PLATELET
Abs Immature Granulocytes: 0.04 10*3/uL (ref 0.00–0.07)
Basophils Absolute: 0 10*3/uL (ref 0.0–0.1)
Basophils Relative: 0 %
Eosinophils Absolute: 0 10*3/uL (ref 0.0–0.5)
Eosinophils Relative: 0 %
HCT: 39.2 % (ref 36.0–46.0)
Hemoglobin: 12.6 g/dL (ref 12.0–15.0)
Immature Granulocytes: 1 %
Lymphocytes Relative: 13 %
Lymphs Abs: 1.1 10*3/uL (ref 0.7–4.0)
MCH: 29.9 pg (ref 26.0–34.0)
MCHC: 32.1 g/dL (ref 30.0–36.0)
MCV: 93.1 fL (ref 80.0–100.0)
Monocytes Absolute: 0.6 10*3/uL (ref 0.1–1.0)
Monocytes Relative: 7 %
Neutro Abs: 6.8 10*3/uL (ref 1.7–7.7)
Neutrophils Relative %: 79 %
Platelets: 234 10*3/uL (ref 150–400)
RBC: 4.21 MIL/uL (ref 3.87–5.11)
RDW: 13.6 % (ref 11.5–15.5)
WBC: 8.6 10*3/uL (ref 4.0–10.5)
nRBC: 0 % (ref 0.0–0.2)

## 2020-05-21 LAB — MAGNESIUM: Magnesium: 0.7 mg/dL — CL (ref 1.7–2.4)

## 2020-05-21 LAB — FIBRINOGEN: Fibrinogen: 650 mg/dL — ABNORMAL HIGH (ref 210–475)

## 2020-05-21 LAB — D-DIMER, QUANTITATIVE: D-Dimer, Quant: 0.52 ug/mL-FEU — ABNORMAL HIGH (ref 0.00–0.50)

## 2020-05-21 LAB — SARS CORONAVIRUS 2 BY RT PCR (HOSPITAL ORDER, PERFORMED IN ~~LOC~~ HOSPITAL LAB): SARS Coronavirus 2: POSITIVE — AB

## 2020-05-21 LAB — HEMOGLOBIN A1C
Hgb A1c MFr Bld: 7.3 % — ABNORMAL HIGH (ref 4.8–5.6)
Mean Plasma Glucose: 162.81 mg/dL

## 2020-05-21 LAB — LACTATE DEHYDROGENASE: LDH: 336 U/L — ABNORMAL HIGH (ref 98–192)

## 2020-05-21 LAB — PROCALCITONIN: Procalcitonin: <0.1 ng/mL

## 2020-05-21 LAB — HIV ANTIBODY (ROUTINE TESTING W REFLEX): HIV Screen 4th Generation wRfx: NONREACTIVE

## 2020-05-21 LAB — TSH: TSH: 0.591 u[IU]/mL (ref 0.350–4.500)

## 2020-05-21 LAB — C-REACTIVE PROTEIN: CRP: 15.5 mg/dL — ABNORMAL HIGH (ref <1.0)

## 2020-05-21 LAB — TRIGLYCERIDES: Triglycerides: 163 mg/dL — ABNORMAL HIGH (ref <150)

## 2020-05-21 LAB — FERRITIN: Ferritin: 181 ng/mL (ref 11–307)

## 2020-05-21 LAB — GLUCOSE, CAPILLARY
Glucose-Capillary: 242 mg/dL — ABNORMAL HIGH (ref 70–99)
Glucose-Capillary: 253 mg/dL — ABNORMAL HIGH (ref 70–99)

## 2020-05-21 LAB — BRAIN NATRIURETIC PEPTIDE: B Natriuretic Peptide: 152 pg/mL — ABNORMAL HIGH (ref 0.0–100.0)

## 2020-05-21 MED ORDER — ALBUTEROL SULFATE HFA 108 (90 BASE) MCG/ACT IN AERS
4.0000 | INHALATION_SPRAY | Freq: Once | RESPIRATORY_TRACT | Status: AC
  Administered 2020-05-21: 4 via RESPIRATORY_TRACT
  Filled 2020-05-21: qty 6.7

## 2020-05-21 MED ORDER — DIGOXIN 0.25 MG/ML IJ SOLN
0.2500 mg | Freq: Four times a day (QID) | INTRAMUSCULAR | Status: AC
  Administered 2020-05-21 – 2020-05-22 (×3): 0.25 mg via INTRAVENOUS
  Filled 2020-05-21 (×4): qty 2

## 2020-05-21 MED ORDER — ESCITALOPRAM OXALATE 10 MG PO TABS
20.0000 mg | ORAL_TABLET | Freq: Every day | ORAL | Status: DC
Start: 2020-05-21 — End: 2020-05-25
  Administered 2020-05-22 – 2020-05-25 (×4): 20 mg via ORAL
  Filled 2020-05-21 (×4): qty 2

## 2020-05-21 MED ORDER — SODIUM CHLORIDE 0.9% FLUSH: 3.0000 mL | INTRAVENOUS | Status: DC | PRN

## 2020-05-21 MED ORDER — INSULIN ASPART 100 UNIT/ML ~~LOC~~ SOLN
0.0000 [IU] | Freq: Every day | SUBCUTANEOUS | Status: DC
  Administered 2020-05-21: 3 [IU] via SUBCUTANEOUS
  Administered 2020-05-22 – 2020-05-24 (×3): 2 [IU] via SUBCUTANEOUS

## 2020-05-21 MED ORDER — UMECLIDINIUM BROMIDE 62.5 MCG/INH IN AEPB
1.0000 | INHALATION_SPRAY | Freq: Every day | RESPIRATORY_TRACT | Status: DC
  Administered 2020-05-21 – 2020-05-25 (×4): 1 via RESPIRATORY_TRACT
  Filled 2020-05-21: qty 7

## 2020-05-21 MED ORDER — POTASSIUM CHLORIDE CRYS ER 20 MEQ PO TBCR
40.0000 meq | EXTENDED_RELEASE_TABLET | ORAL | Status: AC
  Filled 2020-05-21: qty 2

## 2020-05-21 MED ORDER — ACETAMINOPHEN 325 MG PO TABS
650.0000 mg | ORAL_TABLET | ORAL | Status: DC | PRN
  Filled 2020-05-21: qty 2

## 2020-05-21 MED ORDER — IPRATROPIUM BROMIDE 0.02 % IN SOLN: 0.5000 mg | Freq: Four times a day (QID) | RESPIRATORY_TRACT | Status: DC

## 2020-05-21 MED ORDER — TETRAHYDROZOLINE HCL 0.05 % OP SOLN
1.0000 [drp] | Freq: Every day | OPHTHALMIC | Status: DC | PRN
  Filled 2020-05-21: qty 15

## 2020-05-21 MED ORDER — LORATADINE 10 MG PO TABS
10.0000 mg | ORAL_TABLET | Freq: Every day | ORAL | Status: DC
  Administered 2020-05-22 – 2020-05-25 (×4): 10 mg via ORAL
  Filled 2020-05-21 (×4): qty 1

## 2020-05-21 MED ORDER — SODIUM CHLORIDE 0.9 % IV SOLN: 250.0000 mL | INTRAVENOUS | Status: DC | PRN

## 2020-05-21 MED ORDER — METHYLPREDNISOLONE SODIUM SUCC 125 MG IJ SOLR
125.0000 mg | Freq: Once | INTRAMUSCULAR | Status: AC
  Administered 2020-05-21: 125 mg via INTRAVENOUS
  Filled 2020-05-21: qty 2

## 2020-05-21 MED ORDER — DIPHENOXYLATE-ATROPINE 2.5-0.025 MG PO TABS
1.0000 | ORAL_TABLET | Freq: Four times a day (QID) | ORAL | Status: DC | PRN
  Administered 2020-05-21 – 2020-05-22 (×4): 1 via ORAL
  Filled 2020-05-21 (×4): qty 1

## 2020-05-21 MED ORDER — GUAIFENESIN ER 600 MG PO TB12
600.0000 mg | ORAL_TABLET | Freq: Two times a day (BID) | ORAL | Status: DC | PRN
  Administered 2020-05-24: 600 mg via ORAL
  Filled 2020-05-21: qty 1

## 2020-05-21 MED ORDER — ONDANSETRON HCL 4 MG/2ML IJ SOLN: 4.0000 mg | Freq: Four times a day (QID) | INTRAMUSCULAR | Status: DC | PRN

## 2020-05-21 MED ORDER — METOPROLOL TARTRATE 25 MG PO TABS
100.0000 mg | ORAL_TABLET | Freq: Once | ORAL | Status: AC
  Administered 2020-05-21: 100 mg via ORAL
  Filled 2020-05-21: qty 4

## 2020-05-21 MED ORDER — ESTRADIOL 1 MG PO TABS
1.0000 mg | ORAL_TABLET | Freq: Every day | ORAL | Status: DC
  Administered 2020-05-22 – 2020-05-25 (×4): 1 mg via ORAL
  Filled 2020-05-21 (×4): qty 1

## 2020-05-21 MED ORDER — SODIUM CHLORIDE 0.9% FLUSH
3.0000 mL | Freq: Two times a day (BID) | INTRAVENOUS | Status: DC
  Administered 2020-05-21 – 2020-05-25 (×7): 3 mL via INTRAVENOUS

## 2020-05-21 MED ORDER — IRBESARTAN 150 MG PO TABS
150.0000 mg | ORAL_TABLET | Freq: Every day | ORAL | Status: DC
  Administered 2020-05-22 – 2020-05-25 (×4): 150 mg via ORAL
  Filled 2020-05-21 (×4): qty 1

## 2020-05-21 MED ORDER — APIXABAN 5 MG PO TABS
5.0000 mg | ORAL_TABLET | Freq: Two times a day (BID) | ORAL | Status: DC
  Administered 2020-05-21 – 2020-05-25 (×8): 5 mg via ORAL
  Filled 2020-05-21 (×8): qty 1

## 2020-05-21 MED ORDER — FLUTICASONE FUROATE-VILANTEROL 200-25 MCG/INH IN AEPB
1.0000 | INHALATION_SPRAY | Freq: Every day | RESPIRATORY_TRACT | Status: DC
  Administered 2020-05-21 – 2020-05-25 (×4): 1 via RESPIRATORY_TRACT
  Filled 2020-05-21: qty 28

## 2020-05-21 MED ORDER — CYCLOBENZAPRINE HCL 5 MG PO TABS: 5.0000 mg | ORAL_TABLET | Freq: Three times a day (TID) | ORAL | Status: DC | PRN

## 2020-05-21 MED ORDER — FUROSEMIDE 10 MG/ML IJ SOLN
40.0000 mg | Freq: Every day | INTRAMUSCULAR | Status: DC
  Administered 2020-05-21 – 2020-05-22 (×2): 40 mg via INTRAVENOUS
  Filled 2020-05-21 (×2): qty 4

## 2020-05-21 MED ORDER — APIXABAN 5 MG PO TABS
5.0000 mg | ORAL_TABLET | Freq: Two times a day (BID) | ORAL | Status: DC
Start: 2020-05-21 — End: 2020-05-21

## 2020-05-21 MED ORDER — LEVOTHYROXINE SODIUM 25 MCG PO TABS
25.0000 ug | ORAL_TABLET | Freq: Every day | ORAL | Status: DC
  Administered 2020-05-22 – 2020-05-25 (×4): 25 ug via ORAL
  Filled 2020-05-21 (×4): qty 1

## 2020-05-21 MED ORDER — INSULIN ASPART 100 UNIT/ML ~~LOC~~ SOLN
0.0000 [IU] | Freq: Three times a day (TID) | SUBCUTANEOUS | Status: DC
  Administered 2020-05-21 – 2020-05-22 (×2): 7 [IU] via SUBCUTANEOUS
  Administered 2020-05-22 (×2): 4 [IU] via SUBCUTANEOUS
  Administered 2020-05-23: 3 [IU] via SUBCUTANEOUS
  Administered 2020-05-23 (×2): 7 [IU] via SUBCUTANEOUS
  Administered 2020-05-24 (×2): 4 [IU] via SUBCUTANEOUS
  Administered 2020-05-24: 7 [IU] via SUBCUTANEOUS
  Administered 2020-05-25: 11 [IU] via SUBCUTANEOUS
  Administered 2020-05-25: 4 [IU] via SUBCUTANEOUS

## 2020-05-21 MED ORDER — PANTOPRAZOLE SODIUM 40 MG PO TBEC
40.0000 mg | DELAYED_RELEASE_TABLET | Freq: Every day | ORAL | Status: DC
  Administered 2020-05-22 – 2020-05-25 (×4): 40 mg via ORAL
  Filled 2020-05-21 (×4): qty 1

## 2020-05-21 MED ORDER — BUDESON-GLYCOPYRROL-FORMOTEROL 160-9-4.8 MCG/ACT IN AERO: 2.0000 | INHALATION_SPRAY | Freq: Two times a day (BID) | RESPIRATORY_TRACT | Status: DC

## 2020-05-21 MED ORDER — METOPROLOL TARTRATE 100 MG PO TABS
100.0000 mg | ORAL_TABLET | Freq: Two times a day (BID) | ORAL | Status: DC
  Administered 2020-05-21 – 2020-05-25 (×8): 100 mg via ORAL
  Filled 2020-05-21 (×8): qty 1

## 2020-05-21 MED ORDER — POTASSIUM CHLORIDE CRYS ER 20 MEQ PO TBCR
40.0000 meq | EXTENDED_RELEASE_TABLET | Freq: Once | ORAL | Status: AC
  Administered 2020-05-21: 40 meq via ORAL
  Filled 2020-05-21: qty 2

## 2020-05-21 MED ORDER — LORAZEPAM 0.5 MG PO TABS
0.5000 mg | ORAL_TABLET | Freq: Two times a day (BID) | ORAL | Status: DC | PRN
  Administered 2020-05-22: 0.5 mg via ORAL
  Filled 2020-05-21: qty 1

## 2020-05-21 MED ORDER — METFORMIN HCL 500 MG PO TABS
1000.0000 mg | ORAL_TABLET | Freq: Every day | ORAL | Status: DC
  Administered 2020-05-22: 1000 mg via ORAL
  Filled 2020-05-21: qty 2

## 2020-05-21 MED ORDER — PRAVASTATIN SODIUM 40 MG PO TABS
40.0000 mg | ORAL_TABLET | Freq: Every day | ORAL | Status: DC
  Administered 2020-05-22 – 2020-05-25 (×4): 40 mg via ORAL
  Filled 2020-05-21 (×3): qty 1

## 2020-05-21 NOTE — Progress Notes (Signed)
Patient admitted to 5W from ED. Patient is alert and oriented x4. Vital signs are stable but HR is tachy staying in the 110s and She is on 4L  of oxygen. Has no complaints of pain. Skin is intact, no signs of skin breakdown noted on exam. She has some redness on her bottom but it is blanchable.  Patient belongings at bedside (glasses, cell phone, clothing). The patient was shown how to use the call bell. Call bell, phone and bedside table are within reach; bed is in the lowest position.

## 2020-05-21 NOTE — Progress Notes (Signed)
   05/21/20 1635  Assess: MEWS Score  Temp 98.9 F (37.2 C)  BP 140/81  Pulse Rate (!) 120  ECG Heart Rate (!) 116  Resp (!) 22  Level of Consciousness Alert  SpO2 90 %  O2 Device Nasal Cannula  Patient Activity (if Appropriate) In bed  O2 Flow Rate (L/min) 4 L/min  Assess: MEWS Score  MEWS Temp 0  MEWS Systolic 0  MEWS Pulse 2  MEWS RR 1  MEWS LOC 0  MEWS Score 3  MEWS Score Color Yellow  Assess: if the MEWS score is Yellow or Red  Were vital signs taken at a resting state? No  Focused Assessment No change from prior assessment  Early Detection of Sepsis Score *See Row Information* Low  MEWS guidelines implemented *See Row Information* Yes  Treat  Pain Scale 0-10  Pain Score 0  Take Vital Signs  Increase Vital Sign Frequency  Yellow: Q 2hr X 2 then Q 4hr X 2, if remains yellow, continue Q 4hrs  Escalate  MEWS: Escalate Yellow: discuss with charge nurse/RN and consider discussing with provider and RRT  Notify: Charge Nurse/RN  Name of Charge Nurse/RN Notified Charlynne Cousins  Date Charge Nurse/RN Notified 05/21/20  Time Charge Nurse/RN Notified [id number]  Notify: Provider  Provider Name/Title Zhang  Date Provider Notified 05/21/20  Time Provider Notified 1655  Notification Type Page  Notification Reason Other (Comment) (per protocol, yellow MEWS)  Response No new orders  Date of Provider Response 05/21/20  Time of Provider Response 1710

## 2020-05-21 NOTE — H&P (Signed)
History and Physical    Andrea Perry JAS:[id number] DOB: January 27, 1951 DOA: 05/21/2020  PCP: Biagio Borg, MD (Confirm with patient/family/NH records and if not entered, this has to be entered at [facility] point of entry) Patient coming from: [facility]  I have personally briefly reviewed patient's old medical records in [place]  Chief Complaint: Cough, shortness of breath and palpitations  HPI: Andrea Perry is a 70 y.o. female with medical history significant of paroxysmal A. fib status post cardioversion x2 on Eliquis, COPD/asthma, hypothyroidism, IIDM, morbid obesity, presented with cough, increasing shortness of breath.  Symptoms started 2 weeks ago and gradually getting worse, significant reduced exercise tolerance, and has to use 2 extra pillows at night but still having episodes of orthopnea.  Also noticed increasing of bilateral lower extremity swelling.  Denies any chest pain.  Feeling occasional palpitations but no chest pains.  Frequent cough with clear to yellowish to greenish sputum.  Denies any fever chills.  She was not vaccinated for COVID-19 for personal reasons. ED Course: COVID-19 positive, chest x-ray clear for any acute infiltrates.  Active wheezing and received Solu-Medrol 125.  Heart rate ranging from lower 100-1 50s.  Review of Systems: As per HPI otherwise 14 point review of systems negative.    Past Medical History:  Diagnosis Date  . ALLERGIC RHINITIS 08/16/2008  . ANXIETY 08/16/2008  . Arthritis    hands, knees, lower back  . ASTHMA 08/16/2008  . Asthma    BRONCHITIS     DR. Lamonte Sakai    . Bladder leak   . Cancer ([place])    MELANOMA      . Chronic lower back pain    problems with disc L2-5  . COPD 08/16/2008  . DEPRESSION 08/16/2008  . Diabetes mellitus without complication ([place])   . Dysrhythmia    hx AF  . Excessive daytime sleepiness 06/19/2015  . GENITAL HERPES 12/03/2006  . GERD (gastroesophageal reflux disease)   . H/O hiatal hernia   . HEMATOCHEZIA 06/20/2009   . Hemorrhoids   . History of cardioversion 10/30/14  . History of kidney stones   . HYPERLIPIDEMIA 08/16/2008  . HYPERTENSION 12/03/2006  . Hypertension   . Impaired glucose tolerance 09/21/2010  . Overactive bladder 10/20/2015  . Pneumonia    as a child  . Pulmonary HTN ([place]) 06/19/2015  . Shortness of breath dyspnea   . Sleep apnea    MILD NO CPAP ORDERED  . Snoring 06/19/2015    Past Surgical History:  Procedure Laterality Date  . ABDOMINAL HYSTERECTOMY    . APPENDECTOMY    . CARDIOVERSION N/A 03/20/2014   Procedure: CARDIOVERSION;  Surgeon: Josue Hector, MD;  Location: [facility] ENDOSCOPY;  Service: Cardiovascular;  Laterality: N/A;  . CARDIOVERSION N/A 10/30/2014   Procedure: CARDIOVERSION;  Surgeon: Josue Hector, MD;  Location: [facility] ENDOSCOPY;  Service: Cardiovascular;  Laterality: N/A;  . CARDIOVERSION N/A 04/03/2019   Procedure: CARDIOVERSION;  Surgeon: Dorothy Spark, MD;  Location: [facility] ENDOSCOPY;  Service: Cardiovascular;  Laterality: N/A;  . CARPAL TUNNEL RELEASE     Right + LEFT  . CESAREAN SECTION     x 1   . COLONOSCOPY  2004  . CYST EXCISION     RT ARM   . CYSTOCELE REPAIR N/A 10/25/2012   Procedure: ANTERIOR REPAIR (CYSTOCELE);  Surgeon: Gus Height, MD;  Location: [facility];  Service: Gynecology;  Laterality: N/A;  . HEEL SPUR SURGERY Bilateral   . KNEE SURGERY  Right + LEFT  . LUMBAR LAMINECTOMY/DECOMPRESSION MICRODISCECTOMY N/A 03/19/2016   Procedure: Laminectomy and Foraminotomy - Lumbar three-four - Lumbar four-five;  Surgeon: Eustace Moore, MD;  Location: [place];  Service: Neurosurgery;  Laterality: N/A;  . RADIOLOGY WITH ANESTHESIA Right 11/01/2014   Procedure: MRI RIGHT FOREARM;  Surgeon: Medication Radiologist, MD;  Location: [place];  Service: Radiology;  Laterality: Right;  . RADIOLOGY WITH ANESTHESIA Right 08/29/2015   Procedure: MRI - RIGHT FOREARM;  Surgeon: Medication Radiologist, MD;  Location: [place];  Service: Radiology;  Laterality: Right;  . RADIOLOGY WITH  ANESTHESIA N/A 12/05/2015   Procedure: MRI SPINE WITHOUT;  Surgeon: Medication Radiologist, MD;  Location: [place];  Service: Radiology;  Laterality: N/A;  . SKIN CANCER EXCISION     BILAT SHOULDERS  . SPLIT NIGHT STUDY  09/04/2015  . TONSILLECTOMY AND ADENOIDECTOMY       reports that she quit smoking about 17 years ago. Her smoking use included cigarettes. She has a 45.00 pack-year smoking history. She has never used smokeless tobacco. She reports current alcohol use of about 2.0 standard drinks of alcohol per week. She reports that she does not use drugs.  Allergies  Allergen Reactions  . Tramadol Hcl Nausea And Vomiting and Other (See Comments)     mouth dryness, headache  . Codeine Nausea And Vomiting and Nausea Only  . Tape Rash    Plastic tape, bandaids and ekg leads, causes redness and rash  . Vicodin [Hydrocodone-Acetaminophen] Nausea And Vomiting    Family History  Problem Relation Age of Onset  . Diabetes Mother   . Hyperlipidemia Mother   . Hypertension Mother   . Colon polyps Mother   . Stroke Father   . Colon cancer Neg Hx      Prior to Admission medications   Medication Sig Start Date End Date Taking? Authorizing Provider  albuterol (PROVENTIL) (2.5 MG/3ML) 0.083% nebulizer solution Use every 4 hours with Ipratropium 03/12/20   Byrum, Rose Fillers, MD  albuterol (VENTOLIN HFA) 108 (90 Base) MCG/ACT inhaler Inhale 2 puffs by mouth every 6 hours as needed for wheezing or shortness of breath. 05/16/19   Collene Gobble, MD  Blood Glucose Monitoring Suppl (FREESTYLE LITE) DEVI Use as directed daily E11.9 09/26/18   Biagio Borg, MD  BREZTRI AEROSPHERE 160-9-4.8 MCG/ACT AERO INHALE 2 PUFFS TWICE DAILY 05/09/20   Collene Gobble, MD  cetirizine (ZYRTEC) 10 MG tablet Take 10 mg by mouth daily.     [provider]  Cholecalciferol (VITAMIN D) 2000 units tablet Take 2,000 Units by mouth daily.    [provider]  cyclobenzaprine (FLEXERIL) 5 MG tablet Take 1 tablet  by mouth three times daily as needed for muscle spasm 01/22/20   Biagio Borg, MD  dimenhyDRINATE (DRAMAMINE) 50 MG tablet Take 50 mg by mouth daily as needed for nausea.    [provider]  diphenoxylate-atropine (LOMOTIL) 2.5-0.025 MG tablet Take 1 tablet by mouth 4 (four) times daily as needed for diarrhea or loose stools. 03/23/18   Biagio Borg, MD  doxycycline (VIBRA-TABS) 100 MG tablet Take 1 tablet (100 mg total) by mouth 2 (two) times daily. 05/09/20   Hoyt Koch, MD  ELIQUIS 5 MG TABS tablet Take 1 tablet by mouth twice daily 04/05/20   Josue Hector, MD  escitalopram (LEXAPRO) 20 MG tablet Take 1 tablet by mouth once daily 03/21/20   Biagio Borg, MD  estradiol (ESTRACE) 1 MG tablet Take  1 mg by mouth daily. 12/18/18   [provider]  furosemide (LASIX) 40 MG tablet Take 1 tablet by mouth once daily 02/12/20   Biagio Borg, MD  glucose blood (FREESTYLE LITE) test strip Use as instructed once daily E11.9 09/26/18   Biagio Borg, MD  guaiFENesin (MUCINEX) 600 MG 12 hr tablet Take 1 tablet (600 mg total) by mouth 2 (two) times daily as needed for cough or to loosen phlegm. 01/18/17   Biagio Borg, MD  ipratropium (ATROVENT) 0.02 % nebulizer solution Take 2.5 mLs (0.5 mg total) by nebulization 4 (four) times daily. 03/06/20   Collene Gobble, MD  ipratropium-albuterol (DUONEB) 0.5-2.5 (3) MG/3ML SOLN USE 1 AMPULE IN NEBULIZER 4 TIMES DAILY 03/03/19   Collene Gobble, MD  irbesartan (AVAPRO) 150 MG tablet Take 1 tablet by mouth once daily 03/21/20   Biagio Borg, MD  Lancets MISC Use as directed once daily E11.9 09/26/18   Biagio Borg, MD  levothyroxine (SYNTHROID) 25 MCG tablet TAKE 1 TABLET BY MOUTH ONCE DAILY BEFORE BREAKFAST 04/09/20   Biagio Borg, MD  LORazepam (ATIVAN) 1 MG tablet Take 1 tablet by mouth twice daily as needed for anxiety 04/15/20   Biagio Borg, MD  metFORMIN (GLUCOPHAGE) 500 MG tablet Take 2 tablets (1,000 mg total) by mouth daily with  breakfast. 10/03/19   Biagio Borg, MD  metFORMIN (GLUCOPHAGE-XR) 500 MG 24 hr tablet Take 2 tablets (1,000 mg total) by mouth daily with breakfast. 09/30/19   Biagio Borg, MD  metoprolol tartrate (LOPRESSOR) 100 MG tablet Take 1 tablet (100 mg total) by mouth 2 (two) times daily. 11/09/19 02/07/20  Josue Hector, MD  Omega-3 Fatty Acids (FISH OIL) 1200 MG CAPS Take 1,200 mg by mouth daily.     [provider]  pantoprazole (PROTONIX) 40 MG tablet Take 1 tablet by mouth once daily 02/26/20   Biagio Borg, MD  potassium chloride SA (K-DUR,KLOR-CON) 20 MEQ tablet Take 1 tablet (20 mEq total) by mouth daily. 03/25/17   Biagio Borg, MD  pravastatin (PRAVACHOL) 40 MG tablet Take 1 tablet by mouth once daily 05/10/20   Biagio Borg, MD  promethazine-dextromethorphan (PROMETHAZINE-DM) 6.25-15 MG/5ML syrup Take 5 mLs by mouth 4 (four) times daily as needed for cough. 05/09/20   Hoyt Koch, MD  Tetrahydrozoline HCl (VISINE OP) Place 1 drop into both eyes daily as needed (dry eyes).    [provider]  vitamin E 400 UNIT capsule Take 400 Units by mouth daily.    [provider]    Physical Exam: Vitals:   05/21/20 1130 05/21/20 1145 05/21/20 1200 05/21/20 1213  BP: 130/72 (!) 137/92 (!) 119/101   Pulse: (!) 150 (!) 58 (!) 104   Resp: 15 15 (!) 22   Temp:      TempSrc:      SpO2: (!) 87% (!) 88% (!) 88% 93%    Constitutional: NAD, calm, comfortable Vitals:   05/21/20 1130 05/21/20 1145 05/21/20 1200 05/21/20 1213  BP: 130/72 (!) 137/92 (!) 119/101   Pulse: (!) 150 (!) 58 (!) 104   Resp: 15 15 (!) 22   Temp:      TempSrc:      SpO2: (!) 87% (!) 88% (!) 88% 93%   Eyes: PERRL, lids and conjunctivae normal ENMT: Mucous membranes are moist. Posterior pharynx clear of any exudate or lesions.Normal dentition.  Neck: normal, supple, no masses, no thyromegaly Respiratory:  clear to auscultation bilaterally, scattered wheezing wheezing, diffused crackles to mid  levels of bilateral lungs.  Increasing respiratory effort. No accessory muscle use.  Cardiovascular: Irregular rate and tachycardia, no murmurs / rubs / gallops.  Anasarca. 2+ pedal pulses. No carotid bruits.  Abdomen: no tenderness, no masses palpated. No hepatosplenomegaly. Bowel sounds positive.  Musculoskeletal: no clubbing / cyanosis. No joint deformity upper and lower extremities. Good ROM, no contractures. Normal muscle tone.  Skin: no rashes, lesions, ulcers. No induration Neurologic: CN 2-12 grossly intact. Sensation intact, DTR normal. Strength 5/5 in all 4.  Psychiatric: Normal judgment and insight. Alert and oriented x 3. Normal mood.     Labs on Admission: I have personally reviewed following labs and imaging studies  CBC: Recent Labs  Lab 05/21/20 0928  WBC 8.6  NEUTROABS 6.8  HGB 12.6  HCT 39.2  MCV 93.1  PLT 563   Basic Metabolic Panel: Recent Labs  Lab 05/21/20 0928  NA 136  K 2.7*  CL 91*  CO2 27  GLUCOSE 175*  BUN 6*  CREATININE 0.88  CALCIUM 6.8*   GFR: CrCl cannot be calculated (Unknown ideal weight.). Liver Function Tests: No results for input(s): AST, ALT, ALKPHOS, BILITOT, PROT, ALBUMIN in the last 168 hours. No results for input(s): LIPASE, AMYLASE in the last 168 hours. No results for input(s): AMMONIA in the last 168 hours. Coagulation Profile: No results for input(s): INR, PROTIME in the last 168 hours. Cardiac Enzymes: No results for input(s): CKTOTAL, CKMB, CKMBINDEX, TROPONINI in the last 168 hours. BNP (last 3 results) No results for input(s): PROBNP in the last 8760 hours. HbA1C: No results for input(s): HGBA1C in the last 72 hours. CBG: No results for input(s): GLUCAP in the last 168 hours. Lipid Profile: Recent Labs    05/21/20 1146  TRIG 163*   Thyroid Function Tests: No results for input(s): TSH, T4TOTAL, FREET4, T3FREE, THYROIDAB in the last 72 hours. Anemia Panel: Recent Labs    05/21/20 1146  FERRITIN 181    Urine analysis:    Component Value Date/Time   COLORURINE YELLOW 03/17/2017 [place] 03/17/2017 1525   LABSPEC >=1.030 (A) 03/17/2017 1525   PHURINE 6.0 03/17/2017 1525   GLUCOSEU NEGATIVE 03/17/2017 1525   HGBUR NEGATIVE 03/17/2017 1525   BILIRUBINUR SMALL (A) 03/17/2017 1525   KETONESUR TRACE (A) 03/17/2017 1525   UROBILINOGEN 0.2 03/17/2017 1525   NITRITE NEGATIVE 03/17/2017 1525   LEUKOCYTESUR NEGATIVE 03/17/2017 1525    Radiological Exams on Admission: DG Chest Portable 1 View  Result Date: 05/21/2020 CLINICAL DATA:  Cough and shortness of breath. EXAM: PORTABLE CHEST 1 VIEW COMPARISON:  12/01/2018 FINDINGS: 0944 hours. Cardiopericardial silhouette is at upper limits of normal for size. There is pulmonary vascular congestion without overt pulmonary edema. No overt airspace pulmonary edema or focal lung consolidation. No substantial pleural effusion. Telemetry leads overlie the chest. IMPRESSION: Pulmonary vascular congestion without acute cardiopulmonary findings. Electronically Signed   By: Misty Stanley M.D.   On: 05/21/2020 09:55    EKG: Independently reviewed.  Rapid A. fib  Assessment/Plan Active Problems:   COVID-19   CHF (congestive heart failure) ([place])  (please populate well all problems here in Problem List. (For example, if patient is on BP meds at home and you resume or decide to hold them, it is a problem that needs to be her. Same for CAD, COPD, HLD and so on)  Acute on chronic diastolic CHF decompensation -Secondary to uncontrolled A. Fib. -Signs  of fluid overload, with uncontrolled A. fib, will start digoxin loading with 0.25 mg x 4 doses.  Continue p.o. metoprolol. -Echocardiogram for tomorrow when heart rate more controlled.  Chronic A. fib with in and out RVR -Dig loading, not considering Cardizem drip due to acute heart failure -Continue metoprolol -Continue Eliquis  Acute COPD/asthma exacerbation -Avoid albuterol -Xopenex and  Atrovent -IV Solu-Medrol  Severe hypokalemia -40 mEq KCl x3 and recheck tonight -Magnesium level  COVID infection -No signs of active pneumonia, less likely contribute to patient breathing symptoms, monitor off further antiviral treatment.  IIDM with hyperglycemia -Continue Metformin -Add sliding scale  DVT prophylaxis: Eliquis Code Status: Full Code Family Communication: None at bedside Disposition Plan: Expect more than 2 midnight hospital stay for CHF treatment. Consults called: None Admission status: Tele admit   Lequita Halt MD [facility] Pager [phone]  05/21/2020, 2:19 PM

## 2020-05-21 NOTE — ED Notes (Signed)
Date and time results received: 05/21/20 1143 (use smartphrase ".now" to insert current time)  Test: potassium Critical Value: 2.7  Name of Provider Notified: [place]  Orders Received? Or Actions Taken?: No

## 2020-05-21 NOTE — ED Triage Notes (Signed)
Pt BIBA from home with increased SOB and a productive cough with green sputum for 2 weeks. Pt recently treated with abx, with no relief of symptoms. Pt alert and oriented. Resp even and unlabored. RA saturation is 92%.

## 2020-05-21 NOTE — ED Provider Notes (Signed)
[facility] EMERGENCY DEPARTMENT Provider Note   CSN: [id number] Arrival date & time: 05/21/20  0927     History Chief Complaint  Patient presents with  . Cough    Andrea Perry is a 70 y.o. female.  Presenting to ER with concern for cough, shortness of breath.  Patient states that she has felt sick for over 2 weeks now.  Has been having progressive cough and difficulty breathing.  States that she completed a course of antibiotics and steroids by her primary doctor.  No change in symptoms.  Today felt like breathing was worse so she came to ER.  Has not taken her morning medications.  Except for this morning denies any recent missed doses of her medicines.  Has been taking at home albuterol and nebulizer treatments without significant improvement.  No fevers.  HPI     Past Medical History:  Diagnosis Date  . ALLERGIC RHINITIS 08/16/2008  . ANXIETY 08/16/2008  . Arthritis    hands, knees, lower back  . ASTHMA 08/16/2008  . Asthma    BRONCHITIS     DR. Lamonte Sakai    . Bladder leak   . Cancer ([place])    MELANOMA      . Chronic lower back pain    problems with disc L2-5  . COPD 08/16/2008  . DEPRESSION 08/16/2008  . Diabetes mellitus without complication ([place])   . Dysrhythmia    hx AF  . Excessive daytime sleepiness 06/19/2015  . GENITAL HERPES 12/03/2006  . GERD (gastroesophageal reflux disease)   . H/O hiatal hernia   . HEMATOCHEZIA 06/20/2009  . Hemorrhoids   . History of cardioversion 10/30/14  . History of kidney stones   . HYPERLIPIDEMIA 08/16/2008  . HYPERTENSION 12/03/2006  . Hypertension   . Impaired glucose tolerance 09/21/2010  . Overactive bladder 10/20/2015  . Pneumonia    as a child  . Pulmonary HTN ([place]) 06/19/2015  . Shortness of breath dyspnea   . Sleep apnea    MILD NO CPAP ORDERED  . Snoring 06/19/2015    Patient Active Problem List   Diagnosis Date Noted  . COVID-19 05/21/2020  . CHF (congestive heart failure) ([place]) 05/21/2020  . Suspected  COVID-19 virus infection 05/09/2020  . Complex care coordination 02/27/2019  . Healthcare maintenance 02/09/2019  . Medication management 12/01/2018  . Shortness of breath 12/01/2018  . Former smoker 12/01/2018  . Atrial fibrillation, rapid ([place]) 04/29/2018  . Acute gastroenteritis 03/23/2018  . GERD (gastroesophageal reflux disease) 01/03/2018  . Proteinuria 09/15/2017  . Insomnia 03/17/2017  . Rash 11/04/2016  . Hematoma 11/04/2016  . Pre-operative examination for internal medicine 09/11/2016  . S/P lumbar laminectomy 03/19/2016  . Overactive bladder 10/20/2015  . Pulmonary HTN ([place]) 06/19/2015  . Mild obstructive sleep apnea 06/19/2015  . Snoring 06/19/2015  . Abdominal pain 04/18/2015  . Cough 03/08/2015  . A-fib ([place]) 02/05/2014  . Preop cardiovascular exam 02/05/2014  . Arthritis of knee 02/05/2014  . Arm pain, right 08/31/2013  . Low back pain 09/26/2011  . Leg pain 07/07/2011  . Dizziness 07/07/2011  . Menopausal disorder 07/07/2011  . Hemorrhage of rectum and anus 11/04/2010  . Internal thrombosed hemorrhoids 11/04/2010  . Internal hemorrhoids with other complication 123456  . External hemorrhoids without mention of complication 123456  . Dyspepsia 09/24/2010  . Peripheral edema 09/24/2010  . Diabetes ([place]) 09/21/2010  . Preventative health care 09/21/2010  . IBS 06/20/2009  . HEMATOCHEZIA 06/20/2009  . Elevated lipids 08/16/2008  .  Anxiety state 08/16/2008  . Depression 08/16/2008  . Allergic rhinitis 08/16/2008  . ASTHMA 08/16/2008  . COPD (chronic obstructive pulmonary disease) ([place]) 08/16/2008  . GENITAL HERPES 12/03/2006  . Hypothyroidism 12/03/2006  . Essential hypertension 12/03/2006    Past Surgical History:  Procedure Laterality Date  . ABDOMINAL HYSTERECTOMY    . APPENDECTOMY    . CARDIOVERSION N/A 03/20/2014   Procedure: CARDIOVERSION;  Surgeon: Josue Hector, MD;  Location: [facility] ENDOSCOPY;  Service: Cardiovascular;  Laterality: N/A;   . CARDIOVERSION N/A 10/30/2014   Procedure: CARDIOVERSION;  Surgeon: Josue Hector, MD;  Location: [facility] ENDOSCOPY;  Service: Cardiovascular;  Laterality: N/A;  . CARDIOVERSION N/A 04/03/2019   Procedure: CARDIOVERSION;  Surgeon: Dorothy Spark, MD;  Location: [facility] ENDOSCOPY;  Service: Cardiovascular;  Laterality: N/A;  . CARPAL TUNNEL RELEASE     Right + LEFT  . CESAREAN SECTION     x 1   . COLONOSCOPY  2004  . CYST EXCISION     RT ARM   . CYSTOCELE REPAIR N/A 10/25/2012   Procedure: ANTERIOR REPAIR (CYSTOCELE);  Surgeon: Gus Height, MD;  Location: [facility];  Service: Gynecology;  Laterality: N/A;  . HEEL SPUR SURGERY Bilateral   . KNEE SURGERY     Right + LEFT  . LUMBAR LAMINECTOMY/DECOMPRESSION MICRODISCECTOMY N/A 03/19/2016   Procedure: Laminectomy and Foraminotomy - Lumbar three-four - Lumbar four-five;  Surgeon: Eustace Moore, MD;  Location: [facility];  Service: Neurosurgery;  Laterality: N/A;  . RADIOLOGY WITH ANESTHESIA Right 11/01/2014   Procedure: MRI RIGHT FOREARM;  Surgeon: Medication Radiologist, MD;  Location: [facility];  Service: Radiology;  Laterality: Right;  . RADIOLOGY WITH ANESTHESIA Right 08/29/2015   Procedure: MRI - RIGHT FOREARM;  Surgeon: Medication Radiologist, MD;  Location: [facility];  Service: Radiology;  Laterality: Right;  . RADIOLOGY WITH ANESTHESIA N/A 12/05/2015   Procedure: MRI SPINE WITHOUT;  Surgeon: Medication Radiologist, MD;  Location: [facility];  Service: Radiology;  Laterality: N/A;  . SKIN CANCER EXCISION     BILAT SHOULDERS  . SPLIT NIGHT STUDY  09/04/2015  . TONSILLECTOMY AND ADENOIDECTOMY       OB History   No obstetric history on file.     Family History  Problem Relation Age of Onset  . Diabetes Mother   . Hyperlipidemia Mother   . Hypertension Mother   . Colon polyps Mother   . Stroke Father   . Colon cancer Neg Hx     Social History   Tobacco Use  . Smoking status: Former Smoker    Packs/day: 1.50    Years: 30.00    Pack years: 45.00     Types: Cigarettes    Quit date: 04/21/2003    Years since quitting: 17.0  . Smokeless tobacco: Never Used  Vaping Use  . Vaping Use: Never used  Substance Use Topics  . Alcohol use: Yes    Alcohol/week: 2.0 standard drinks    Types: 2 Standard drinks or equivalent per week    Comment: OCCAS  . Drug use: No    Home Medications Prior to Admission medications   Medication Sig Start Date End Date Taking? Authorizing Provider  albuterol (PROVENTIL) (2.5 MG/3ML) 0.083% nebulizer solution Use every 4 hours with Ipratropium Patient taking differently: Take 2.5 mg by nebulization every 4 (four) hours as needed for wheezing or shortness of breath. 03/12/20  Yes Collene Gobble, MD  albuterol (VENTOLIN HFA) 108 (90 Base) MCG/ACT inhaler Inhale 2 puffs by mouth  every 6 hours as needed for wheezing or shortness of breath. Patient taking differently: Inhale 2 puffs into the lungs every 6 (six) hours as needed for wheezing or shortness of breath. 05/16/19  Yes Byrum, Rose Fillers, MD  Budeson-Glycopyrrol-Formoterol (BREZTRI AEROSPHERE) 160-9-4.8 MCG/ACT AERO Inhale 2 puffs into the lungs in the morning, at noon, and at bedtime.   Yes [provider]  cetirizine (ZYRTEC) 10 MG tablet Take 10 mg by mouth daily.   Yes [provider]  Cholecalciferol (VITAMIN D) 2000 units tablet Take 2,000 Units by mouth daily.   Yes [provider]  cyclobenzaprine (FLEXERIL) 5 MG tablet Take 1 tablet by mouth three times daily as needed for muscle spasm Patient taking differently: Take 5 mg by mouth daily as needed for muscle spasms. 01/22/20  Yes Biagio Borg, MD  dimenhyDRINATE (DRAMAMINE) 50 MG tablet Take 50 mg by mouth daily as needed for nausea.   Yes [provider]  diphenoxylate-atropine (LOMOTIL) 2.5-0.025 MG tablet Take 1 tablet by mouth 4 (four) times daily as needed for diarrhea or loose stools. 03/23/18  Yes Biagio Borg, MD  ELIQUIS 5 MG TABS tablet Take 1 tablet by mouth  twice daily Patient taking differently: Take 5 mg by mouth 2 (two) times daily. 04/05/20  Yes Josue Hector, MD  escitalopram (LEXAPRO) 20 MG tablet Take 1 tablet by mouth once daily Patient taking differently: Take 20 mg by mouth at bedtime. 03/21/20  Yes Biagio Borg, MD  estradiol (ESTRACE) 1 MG tablet Take 1 mg by mouth daily. 12/18/18  Yes [provider]  furosemide (LASIX) 40 MG tablet Take 1 tablet by mouth once daily Patient taking differently: Take 40 mg by mouth daily. 02/12/20  Yes Biagio Borg, MD  guaiFENesin (MUCINEX) 600 MG 12 hr tablet Take 1 tablet (600 mg total) by mouth 2 (two) times daily as needed for cough or to loosen phlegm. 01/18/17  Yes Biagio Borg, MD  ipratropium (ATROVENT) 0.02 % nebulizer solution Take 2.5 mLs (0.5 mg total) by nebulization 4 (four) times daily. Patient taking differently: Take 0.5 mg by nebulization every 4 (four) hours as needed for wheezing or shortness of breath. 03/06/20  Yes Collene Gobble, MD  irbesartan (AVAPRO) 150 MG tablet Take 1 tablet by mouth once daily Patient taking differently: Take 150 mg by mouth daily. 03/21/20  Yes Biagio Borg, MD  levothyroxine (SYNTHROID) 25 MCG tablet TAKE 1 TABLET BY MOUTH ONCE DAILY BEFORE BREAKFAST Patient taking differently: Take 25 mcg by mouth daily before breakfast. 04/09/20  Yes Biagio Borg, MD  LORazepam (ATIVAN) 1 MG tablet Take 1 tablet by mouth twice daily as needed for anxiety Patient taking differently: Take 1 mg by mouth 2 (two) times daily. 04/15/20  Yes Biagio Borg, MD  metFORMIN (GLUCOPHAGE-XR) 500 MG 24 hr tablet Take 2 tablets (1,000 mg total) by mouth daily with breakfast. 09/30/19  Yes Biagio Borg, MD  metoprolol tartrate (LOPRESSOR) 100 MG tablet Take 1 tablet (100 mg total) by mouth 2 (two) times daily. 11/09/19 02/07/20 Yes Josue Hector, MD  Omega-3 Fatty Acids (FISH OIL) 1200 MG CAPS Take 1,200 mg by mouth daily.   Yes [provider]  pantoprazole  (PROTONIX) 40 MG tablet Take 1 tablet by mouth once daily Patient taking differently: Take 40 mg by mouth daily. 02/26/20  Yes Biagio Borg, MD  potassium chloride SA (K-DUR,KLOR-CON) 20 MEQ tablet Take 1 tablet (20 mEq total)  by mouth daily. 03/25/17  Yes Biagio Borg, MD  pravastatin (PRAVACHOL) 40 MG tablet Take 1 tablet by mouth once daily Patient taking differently: Take 40 mg by mouth daily. 05/10/20  Yes Biagio Borg, MD  promethazine-dextromethorphan (PROMETHAZINE-DM) 6.25-15 MG/5ML syrup Take 5 mLs by mouth 4 (four) times daily as needed for cough. 05/09/20  Yes Hoyt Koch, MD  vitamin E 400 UNIT capsule Take 400 Units by mouth daily.   Yes [provider]  Blood Glucose Monitoring Suppl (FREESTYLE LITE) DEVI Use as directed daily E11.9 09/26/18   Biagio Borg, MD  BREZTRI AEROSPHERE 160-9-4.8 MCG/ACT AERO INHALE 2 PUFFS TWICE DAILY Patient not taking: No sig reported 05/09/20   Collene Gobble, MD  doxycycline (VIBRA-TABS) 100 MG tablet Take 1 tablet (100 mg total) by mouth 2 (two) times daily. Patient not taking: No sig reported 05/09/20   Hoyt Koch, MD  glucose blood (FREESTYLE LITE) test strip Use as instructed once daily E11.9 09/26/18   Biagio Borg, MD  ipratropium-albuterol (DUONEB) 0.5-2.5 (3) MG/3ML SOLN USE 1 AMPULE IN NEBULIZER 4 TIMES DAILY Patient not taking: No sig reported 03/03/19   Collene Gobble, MD  Lancets MISC Use as directed once daily E11.9 09/26/18   Biagio Borg, MD  metFORMIN (GLUCOPHAGE) 500 MG tablet Take 2 tablets (1,000 mg total) by mouth daily with breakfast. Patient not taking: No sig reported 10/03/19   Biagio Borg, MD    Allergies    Tramadol hcl, Codeine, Tape, and Vicodin [hydrocodone-acetaminophen]  Review of Systems   Review of Systems  Constitutional: Positive for chills and fatigue. Negative for fever.  HENT: Negative for ear pain and sore throat.   Eyes: Negative for pain and visual disturbance.  Respiratory:  Positive for cough and shortness of breath.   Cardiovascular: Negative for chest pain and palpitations.  Gastrointestinal: Negative for abdominal pain and vomiting.  Genitourinary: Negative for dysuria and hematuria.  Musculoskeletal: Negative for arthralgias and back pain.  Skin: Negative for color change and rash.  Neurological: Negative for seizures and syncope.  All other systems reviewed and are negative.   Physical Exam Updated Vital Signs BP (!) 141/89 (BP Location: Left Arm)   Pulse 91   Temp 98.7 F (37.1 C) (Oral)   Resp 16   Ht 5\' 2"  (1.575 m)   Wt 123.3 kg   SpO2 98%   BMI 49.72 kg/m   Physical Exam Vitals and nursing note reviewed.  Constitutional:      General: She is not in acute distress.    Appearance: She is well-developed and well-nourished.  HENT:     Head: Normocephalic and atraumatic.  Eyes:     Conjunctiva/sclera: Conjunctivae normal.  Cardiovascular:     Rate and Rhythm: Normal rate and regular rhythm.     Heart sounds: No murmur heard.   Pulmonary:     Effort: Pulmonary effort is normal. No respiratory distress.     Breath sounds: Normal breath sounds.     Comments: Expiratory wheeze noted bilaterally, mild tachypnea but no respiratory distress and speaking in full sentences Abdominal:     Palpations: Abdomen is soft.     Tenderness: There is no abdominal tenderness.  Musculoskeletal:        General: No deformity, signs of injury or edema.     Cervical back: Neck supple.  Skin:    General: Skin is warm and dry.  Neurological:     General: No  focal deficit present.     Mental Status: She is alert and oriented to person, place, and time.  Psychiatric:        Mood and Affect: Mood and affect normal.        Behavior: Behavior normal.     ED Results / Procedures / Treatments   Labs (all labs ordered are listed, but only abnormal results are displayed) Labs Reviewed  SARS CORONAVIRUS 2 BY RT PCR ([place]) - Abnormal; Notable for the following components:      Result Value   SARS Coronavirus 2 POSITIVE (*)    All other components within normal limits  BASIC METABOLIC PANEL - Abnormal; Notable for the following components:   Potassium 2.7 (*)    Chloride 91 (*)    Glucose, Bld 175 (*)    BUN 6 (*)    Calcium 6.8 (*)    Anion gap 18 (*)    All other components within normal limits  BRAIN NATRIURETIC PEPTIDE - Abnormal; Notable for the following components:   B Natriuretic Peptide 152.0 (*)    All other components within normal limits  D-DIMER, QUANTITATIVE (NOT AT [facility]) - Abnormal; Notable for the following components:   D-Dimer, Quant 0.52 (*)    All other components within normal limits  LACTATE DEHYDROGENASE - Abnormal; Notable for the following components:   LDH 336 (*)    All other components within normal limits  FIBRINOGEN - Abnormal; Notable for the following components:   Fibrinogen 650 (*)    All other components within normal limits  C-REACTIVE PROTEIN - Abnormal; Notable for the following components:   CRP 15.5 (*)    All other components within normal limits  TRIGLYCERIDES - Abnormal; Notable for the following components:   Triglycerides 163 (*)    All other components within normal limits  MAGNESIUM - Abnormal; Notable for the following components:   Magnesium 0.7 (*)    All other components within normal limits  HEMOGLOBIN A1C - Abnormal; Notable for the following components:   Hgb A1c MFr Bld 7.3 (*)    All other components within normal limits  BASIC METABOLIC PANEL - Abnormal; Notable for the following components:   Potassium 3.0 (*)    Chloride 92 (*)    Glucose, Bld 247 (*)    BUN 6 (*)    Calcium 6.6 (*)    All other components within normal limits  COMPREHENSIVE METABOLIC PANEL - Abnormal; Notable for the following components:   Chloride 90 (*)    Glucose, Bld 226 (*)    Calcium 6.9 (*)    Albumin 2.7 (*)    Total Bilirubin 1.3 (*)     All other components within normal limits  C-REACTIVE PROTEIN - Abnormal; Notable for the following components:   CRP 16.1 (*)    All other components within normal limits  MAGNESIUM - Abnormal; Notable for the following components:   Magnesium 0.8 (*)    All other components within normal limits  GLUCOSE, CAPILLARY - Abnormal; Notable for the following components:   Glucose-Capillary 242 (*)    All other components within normal limits  GLUCOSE, CAPILLARY - Abnormal; Notable for the following components:   Glucose-Capillary 253 (*)    All other components within normal limits  CBC WITH DIFFERENTIAL/PLATELET  PROCALCITONIN  FERRITIN  TSH  HIV ANTIBODY (ROUTINE TESTING W REFLEX)  CBC WITH DIFFERENTIAL/PLATELET  D-DIMER, QUANTITATIVE (NOT AT [facility])  FERRITIN  PHOSPHORUS  TROPONIN I (HIGH SENSITIVITY)  TROPONIN I (HIGH SENSITIVITY)    EKG None  Radiology DG Chest Portable 1 View  Result Date: 05/21/2020 CLINICAL DATA:  Cough and shortness of breath. EXAM: PORTABLE CHEST 1 VIEW COMPARISON:  12/01/2018 FINDINGS: 0944 hours. Cardiopericardial silhouette is at upper limits of normal for size. There is pulmonary vascular congestion without overt pulmonary edema. No overt airspace pulmonary edema or focal lung consolidation. No substantial pleural effusion. Telemetry leads overlie the chest. IMPRESSION: Pulmonary vascular congestion without acute cardiopulmonary findings. Electronically Signed   By: Misty Stanley M.D.   On: 05/21/2020 09:55    Procedures .Critical Care Performed by: Lucrezia Starch, MD Authorized by: Lucrezia Starch, MD   Critical care provider statement:    Critical care time (minutes):  40   Critical care was necessary to treat or prevent imminent or life-threatening deterioration of the following conditions:  Respiratory failure   Critical care was time spent personally by me on the following activities:  Discussions with consultants, evaluation of patient's  response to treatment, examination of patient, ordering and performing treatments and interventions, ordering and review of laboratory studies, ordering and review of radiographic studies, pulse oximetry, re-evaluation of patient's condition, obtaining history from patient or surrogate and review of old charts     Medications Ordered in ED Medications  furosemide (LASIX) injection 40 mg (40 mg Intravenous Given 05/21/20 1847)  irbesartan (AVAPRO) tablet 150 mg (150 mg Oral Not Given 05/21/20 1415)  metoprolol tartrate (LOPRESSOR) tablet 100 mg (100 mg Oral Given 05/21/20 2120)  digoxin (LANOXIN) 0.25 MG/ML injection 0.25 mg (0.25 mg Intravenous Given 05/22/20 0124)  pravastatin (PRAVACHOL) tablet 40 mg (40 mg Oral Not Given 05/21/20 1415)  escitalopram (LEXAPRO) tablet 20 mg (20 mg Oral Not Given 05/21/20 1415)  LORazepam (ATIVAN) tablet 0.5 mg (has no administration in time range)  estradiol (ESTRACE) tablet 1 mg (1 mg Oral Not Given 05/21/20 1415)  levothyroxine (SYNTHROID) tablet 25 mcg (25 mcg Oral Given 05/22/20 0511)  metFORMIN (GLUCOPHAGE) tablet 1,000 mg (has no administration in time range)  diphenoxylate-atropine (LOMOTIL) 2.5-0.025 MG per tablet 1 tablet (1 tablet Oral Given 05/22/20 0511)  pantoprazole (PROTONIX) EC tablet 40 mg (has no administration in time range)  cyclobenzaprine (FLEXERIL) tablet 5 mg (has no administration in time range)  loratadine (CLARITIN) tablet 10 mg (has no administration in time range)  guaiFENesin (MUCINEX) 12 hr tablet 600 mg (has no administration in time range)  tetrahydrozoline 0.05 % ophthalmic solution 1 drop (has no administration in time range)  sodium chloride flush (NS) 0.9 % injection 3 mL (3 mLs Intravenous Given 05/21/20 2121)  sodium chloride flush (NS) 0.9 % injection 3 mL (has no administration in time range)  0.9 %  sodium chloride infusion (has no administration in time range)  acetaminophen (TYLENOL) tablet 650 mg (has no administration in time  range)  ondansetron (ZOFRAN) injection 4 mg (has no administration in time range)  potassium chloride SA (KLOR-CON) CR tablet 40 mEq (40 mEq Oral Not Given 05/21/20 1847)  insulin aspart (novoLOG) injection 0-20 Units (7 Units Subcutaneous Given 05/21/20 1846)  insulin aspart (novoLOG) injection 0-5 Units (3 Units Subcutaneous Given 05/21/20 2119)  apixaban (ELIQUIS) tablet 5 mg (5 mg Oral Given 05/21/20 2150)  fluticasone furoate-vilanterol (BREO ELLIPTA) 200-25 MCG/INH 1 puff (1 puff Inhalation Given 05/21/20 2236)    And  umeclidinium bromide (INCRUSE ELLIPTA) 62.5 MCG/INH 1 puff (1 puff Inhalation Given 05/21/20 2236)  albuterol (VENTOLIN HFA)  108 (90 Base) MCG/ACT inhaler 4 puff (4 puffs Inhalation Given 05/21/20 1039)  metoprolol tartrate (LOPRESSOR) tablet 100 mg (100 mg Oral Given 05/21/20 1039)  methylPREDNISolone sodium succinate (SOLU-MEDROL) 125 mg/2 mL injection 125 mg (125 mg Intravenous Given 05/21/20 1039)  potassium chloride SA (KLOR-CON) CR tablet 40 mEq (40 mEq Oral Given 05/21/20 1206)  magnesium sulfate IVPB 4 g 100 mL (4 g Intravenous New Bag/Given 05/22/20 [id number])    ED Course  I have reviewed the triage vital signs and the nursing notes.  Pertinent labs & imaging results that were available during my care of the patient were reviewed by me and considered in my medical decision making (see chart for details).    MDM Rules/Calculators/A&P                         70 year old lady came to ER with concern for cough and difficulty breathing.  On exam patient well-appearing in no distress but noted some mild tachypnea.  Mild hypoxia, particularly with exertion.  Covid positive.  Suspect symptoms related to this finding.  Will admit to hospitalist for further management.  Given symptoms have been ongoing for couple weeks, will defer ongoing steroids for remdesivir treatment to primary admitting team.  Final Clinical Impression(s) / ED Diagnoses Final diagnoses:  COVID-19  Acute respiratory  failure with hypoxia [facility])    Rx / DC Orders ED Discharge Orders    None       Lucrezia Starch, MD 05/22/20 [phone]

## 2020-05-22 ENCOUNTER — Inpatient Hospital Stay (HOSPITAL_COMMUNITY): Payer: Medicare Other

## 2020-05-22 DIAGNOSIS — I10 Essential (primary) hypertension: Secondary | ICD-10-CM

## 2020-05-22 DIAGNOSIS — I5033 Acute on chronic diastolic (congestive) heart failure: Secondary | ICD-10-CM | POA: Diagnosis not present

## 2020-05-22 DIAGNOSIS — J9601 Acute respiratory failure with hypoxia: Secondary | ICD-10-CM

## 2020-05-22 DIAGNOSIS — I5031 Acute diastolic (congestive) heart failure: Secondary | ICD-10-CM

## 2020-05-22 DIAGNOSIS — U071 COVID-19: Principal | ICD-10-CM

## 2020-05-22 LAB — CBC WITH DIFFERENTIAL/PLATELET
Abs Immature Granulocytes: 0.02 10*3/uL (ref 0.00–0.07)
Basophils Absolute: 0 10*3/uL (ref 0.0–0.1)
Basophils Relative: 0 %
Eosinophils Absolute: 0 10*3/uL (ref 0.0–0.5)
Eosinophils Relative: 0 %
HCT: 41.2 % (ref 36.0–46.0)
Hemoglobin: 13.4 g/dL (ref 12.0–15.0)
Immature Granulocytes: 0 %
Lymphocytes Relative: 14 %
Lymphs Abs: 0.8 10*3/uL (ref 0.7–4.0)
MCH: 30.2 pg (ref 26.0–34.0)
MCHC: 32.5 g/dL (ref 30.0–36.0)
MCV: 92.8 fL (ref 80.0–100.0)
Monocytes Absolute: 0.4 10*3/uL (ref 0.1–1.0)
Monocytes Relative: 7 %
Neutro Abs: 4.3 10*3/uL (ref 1.7–7.7)
Neutrophils Relative %: 79 %
Platelets: 255 10*3/uL (ref 150–400)
RBC: 4.44 MIL/uL (ref 3.87–5.11)
RDW: 13.5 % (ref 11.5–15.5)
WBC: 5.5 10*3/uL (ref 4.0–10.5)
nRBC: 0 % (ref 0.0–0.2)

## 2020-05-22 LAB — COMPREHENSIVE METABOLIC PANEL
ALT: 20 U/L (ref 0–44)
AST: 31 U/L (ref 15–41)
Albumin: 2.7 g/dL — ABNORMAL LOW (ref 3.5–5.0)
Alkaline Phosphatase: 71 U/L (ref 38–126)
Anion gap: 15 (ref 5–15)
BUN: 9 mg/dL (ref 8–23)
CO2: 31 mmol/L (ref 22–32)
Calcium: 6.9 mg/dL — ABNORMAL LOW (ref 8.9–10.3)
Chloride: 90 mmol/L — ABNORMAL LOW (ref 98–111)
Creatinine, Ser: 0.78 mg/dL (ref 0.44–1.00)
GFR, Estimated: >60 mL/min (ref >60)
Glucose, Bld: 226 mg/dL — ABNORMAL HIGH (ref 70–99)
Potassium: 3.5 mmol/L (ref 3.5–5.1)
Sodium: 136 mmol/L (ref 135–145)
Total Bilirubin: 1.3 mg/dL — ABNORMAL HIGH (ref 0.3–1.2)
Total Protein: 6.6 g/dL (ref 6.5–8.1)

## 2020-05-22 LAB — ECHOCARDIOGRAM LIMITED
Area-P 1/2: 2.73 cm2
Height: 62 in
S' Lateral: 3.4 cm
Weight: 4349.23 oz

## 2020-05-22 LAB — FERRITIN: Ferritin: 230 ng/mL (ref 11–307)

## 2020-05-22 LAB — MAGNESIUM: Magnesium: 0.8 mg/dL — CL (ref 1.7–2.4)

## 2020-05-22 LAB — D-DIMER, QUANTITATIVE: D-Dimer, Quant: 0.31 ug/mL-FEU (ref 0.00–0.50)

## 2020-05-22 LAB — GLUCOSE, CAPILLARY
Glucose-Capillary: 221 mg/dL — ABNORMAL HIGH (ref 70–99)
Glucose-Capillary: 162 mg/dL — ABNORMAL HIGH (ref 70–99)
Glucose-Capillary: 180 mg/dL — ABNORMAL HIGH (ref 70–99)
Glucose-Capillary: 230 mg/dL — ABNORMAL HIGH (ref 70–99)

## 2020-05-22 LAB — PHOSPHORUS: Phosphorus: 3.8 mg/dL (ref 2.5–4.6)

## 2020-05-22 LAB — C-REACTIVE PROTEIN: CRP: 16.1 mg/dL — ABNORMAL HIGH (ref <1.0)

## 2020-05-22 MED ORDER — SODIUM CHLORIDE 0.9 % IV SOLN
100.0000 mg | Freq: Every day | INTRAVENOUS | Status: AC
  Administered 2020-05-23 – 2020-05-24 (×2): 100 mg via INTRAVENOUS
  Filled 2020-05-22 (×2): qty 20

## 2020-05-22 MED ORDER — INSULIN ASPART 100 UNIT/ML ~~LOC~~ SOLN
4.0000 [IU] | Freq: Three times a day (TID) | SUBCUTANEOUS | Status: DC
  Administered 2020-05-22 – 2020-05-25 (×9): 4 [IU] via SUBCUTANEOUS

## 2020-05-22 MED ORDER — SODIUM CHLORIDE 0.9 % IV SOLN
200.0000 mg | Freq: Once | INTRAVENOUS | Status: AC
  Administered 2020-05-22: 200 mg via INTRAVENOUS
  Filled 2020-05-22: qty 200

## 2020-05-22 MED ORDER — METHYLPREDNISOLONE SODIUM SUCC 40 MG IJ SOLR
40.0000 mg | Freq: Two times a day (BID) | INTRAMUSCULAR | Status: DC
  Administered 2020-05-22 – 2020-05-25 (×6): 40 mg via INTRAVENOUS
  Filled 2020-05-22 (×6): qty 1

## 2020-05-22 MED ORDER — ADULT MULTIVITAMIN W/MINERALS CH
1.0000 | ORAL_TABLET | Freq: Every day | ORAL | Status: DC
  Administered 2020-05-22 – 2020-05-25 (×4): 1 via ORAL
  Filled 2020-05-22 (×4): qty 1

## 2020-05-22 MED ORDER — PREDNISONE 20 MG PO TABS: 40.0000 mg | ORAL_TABLET | Freq: Every day | ORAL | Status: DC

## 2020-05-22 MED ORDER — ENSURE MAX PROTEIN PO LIQD
11.0000 [oz_av] | Freq: Two times a day (BID) | ORAL | Status: DC
  Administered 2020-05-22 – 2020-05-25 (×3): 11 [oz_av] via ORAL
  Filled 2020-05-22 (×8): qty 330

## 2020-05-22 MED ORDER — MAGNESIUM SULFATE 4 GM/100ML IV SOLN
4.0000 g | Freq: Once | INTRAVENOUS | Status: AC
  Administered 2020-05-22: 4 g via INTRAVENOUS
  Filled 2020-05-22: qty 100

## 2020-05-22 MED ORDER — POTASSIUM CHLORIDE CRYS ER 20 MEQ PO TBCR
20.0000 meq | EXTENDED_RELEASE_TABLET | Freq: Every day | ORAL | Status: DC
  Administered 2020-05-22: 20 meq via ORAL
  Filled 2020-05-22: qty 1

## 2020-05-22 MED ORDER — INSULIN DETEMIR 100 UNIT/ML ~~LOC~~ SOLN
10.0000 [IU] | Freq: Every day | SUBCUTANEOUS | Status: DC
  Administered 2020-05-22 – 2020-05-24 (×3): 10 [IU] via SUBCUTANEOUS
  Filled 2020-05-22 (×4): qty 0.1

## 2020-05-22 MED ORDER — FUROSEMIDE 10 MG/ML IJ SOLN
40.0000 mg | Freq: Two times a day (BID) | INTRAMUSCULAR | Status: DC
  Administered 2020-05-22 – 2020-05-25 (×6): 40 mg via INTRAVENOUS
  Filled 2020-05-22 (×6): qty 4

## 2020-05-22 NOTE — Progress Notes (Signed)
Initial Nutrition Assessment  DOCUMENTATION CODES:   Morbid obesity  INTERVENTION:  Ensure Max po BID, each supplement provides 150 kcal and 30 grams of protein  MVI with minerals daily  NUTRITION DIAGNOSIS:   Increased nutrient needs related to acute illness (COVID-19) as evidenced by estimated needs.    GOAL:   Patient will meet greater than or equal to 90% of their needs    MONITOR:   PO intake,Supplement acceptance,Labs,Weight trends,I & O's  REASON FOR ASSESSMENT:   Consult Assessment of nutrition requirement/status  ASSESSMENT:   Pt admitted with COVID-19 infection. PMH includes afib s/p cardioversion x2, COPD/asthma, hypothyroidism, type 2 DM.  Note pt had Mg of 0.8 this morning; 4g IV Mg sulfate administered. Per RN, pt had several bouts of watery diarrhea last night. Pt denies abdominal pain and any recent antibiotic use. No blood noted in stool. Per MD, diarrhea likely 2/2 COVID-19 infection.   Pt unavailable at time of RD visit. Will order oral nutrition supplements to provide additional kcals/protein given increased nutrient needs.   Reviewed weight history. No significant weight changes noted.   PO Intake: 100% x 1 recorded meal  UOP: 1762ml x24 hours  Labs: Mg 0.8 (L), Corrected Ca 7.94 (L), CBGs 221-253 Medications: lasix, ss novolog TID w/ meals and bedtime, glucophage, klor-con  Diet Order:   Diet Order            Diet heart healthy/carb modified Room service appropriate? Yes; Fluid consistency: Thin; Fluid restriction: 2000 mL Fluid  Diet effective now                 EDUCATION NEEDS:   No education needs have been identified at this time  Skin:  Skin Assessment: Reviewed RN Assessment  Last BM:  2/1 type 7  Height:   Ht Readings from Last 1 Encounters:  05/21/20 5\' 2"  (1.575 m)    Weight:   Wt Readings from Last 1 Encounters:  05/22/20 123.3 kg    Ideal Body Weight:  50 kg  BMI:  Body mass index is 49.72  kg/m.  Estimated Nutritional Needs:   Kcal:  2100-2300  Protein:  155-165 grams  Fluid:  >2L    Larkin Ina, MS, RD, LDN RD pager number and weekend/on-call pager number located in [place].

## 2020-05-22 NOTE — Evaluation (Signed)
Occupational Therapy Evaluation Patient Details Name: Andrea Perry MRN: [id number] DOB: [birth year] Today's Date: 05/22/2020    History of Present Illness Pt is a 70 y.o. female with medical history significant of paroxysmal A. fib status post cardioversion x2 on Eliquis, COPD/asthma, hypothyroidism, IIDM, morbid obesity, presented with cough, increasing shortness of breath.  Pt admitted with COVID 19 (no signs of PNE), chronic afib with RVR, COPD exacerbation, and acute on chronic CHF.   Clinical Impression   Pt admitted with above. She demonstrates the below listed deficits and will benefit from continued OT to maximize safety and independence with BADLs.  Pt presents to OT with generalized weakness, decreased activity tolerance, impaired balance.  She currently requires set up - max A for ADLs, and min guard assist for functional transfers.  She fatigues quickly with activity.  Sp02 mid 90s on 4L supplemental 02.  Pt reports she lives with her son, and his family.  She reports she was mod I - intermittent assist for ADLs and mod I for functional mobility PTA.  She required assist with IADLs.  Recommend HHOT at discharge.  Will follow acutely.      Follow Up Recommendations  Home health OT;Supervision/Assistance - 24 hour (supervision initially, then tapering down)    Equipment Recommendations  None recommended by OT    Recommendations for Other Services       Precautions / Restrictions Precautions Precautions: None      Mobility Bed Mobility Overal bed mobility: Needs Assistance Bed Mobility: Sit to Supine     Supine to sit: Supervision;HOB elevated          Transfers Overall transfer level: Needs assistance Equipment used: None Transfers: Sit to/from Stand;Stand Pivot Transfers Sit to Stand: Min guard Stand pivot transfers: Min guard       General transfer comment: min guard for balance    Balance Overall balance assessment: Needs assistance Sitting-balance  support: No upper extremity supported Sitting balance-Leahy Scale: Good     Standing balance support: No upper extremity supported Standing balance-Leahy Scale: Fair Standing balance comment: able to maintain static standing with min guard assist x 2.5 mins before fatiguing                           ADL either performed or assessed with clinical judgement   ADL Overall ADL's : Needs assistance/impaired Eating/Feeding: Independent   Grooming: Wash/dry hands;Wash/dry face;Oral care;Brushing hair;Min guard;Standing   Upper Body Bathing: Set up;Supervision/ safety;Sitting   Lower Body Bathing: Moderate assistance;Sit to/from stand   Upper Body Dressing : Set up;Supervision/safety;Sitting   Lower Body Dressing: Maximal assistance;Sit to/from stand   Toilet Transfer: Min guard;Stand-pivot;BSC   Toileting- Water quality scientist and Hygiene: Maximal assistance;Sit to/from stand Toileting - Clothing Manipulation Details (indicate cue type and reason): Pt with frequent bouts of diarrhea     Functional mobility during ADLs: Min guard       Vision Baseline Vision/History: Wears glasses Wears Glasses: At all times       Perception     Praxis      Pertinent Vitals/Pain Pain Assessment: 0-10 Pain Score: 6  Pain Location: peri area due to diarrhea Pain Descriptors / Indicators: Burning Pain Intervention(s): Repositioned;Other (comment) (barrier cream applied)     Hand Dominance Right   Extremity/Trunk Assessment Upper Extremity Assessment Upper Extremity Assessment: Generalized weakness (grossly 4/5. Tremulous as she fatigues)   Lower Extremity Assessment Lower Extremity Assessment: Generalized weakness   Cervical /  Trunk Assessment Cervical / Trunk Assessment: Normal   Communication Communication Communication: No difficulties   Cognition Arousal/Alertness: Awake/alert Behavior During Therapy: WFL for tasks assessed/performed Overall Cognitive Status:  Within Functional Limits for tasks assessed                                 General Comments: very pleasant and motivated   General Comments  Sp02 mid 90s on 4L supplemental 02    Exercises     Shoulder Instructions      Home Living Family/patient expects to be discharged to:: Private residence Living Arrangements: Other (Comment) (son, and son's family) Available Help at Discharge: Family;Available 24 hours/day Type of Home: House Home Access: Stairs to enter [employer] of Steps: 5 Entrance Stairs-Rails: Right Home Layout: One level     Bathroom Shower/Tub: Teacher, early years/pre: Standard     Home Equipment: Bedside commode;Shower seat;Walker - standard;Adaptive equipment Adaptive Equipment: Long-handled sponge        Prior Functioning/Environment Level of Independence: Needs assistance  Gait / Transfers Assistance Needed: Pt ambulates in house and short community  without AD; uses grocery cart at store.  Pt reports to OT that son does the grocery shopping, and she occasionally uses SW at home when she fatigues ADL's / Homemaking Assistance Needed: mod I with ADLs.  She sits to shower, uses LH brush, or lets H20 run over her feet to bathe her feet. Does not wear socks.  Family assists with IADLs            OT Problem List: Decreased strength;Decreased activity tolerance;Impaired balance (sitting and/or standing);Decreased knowledge of use of DME or AE;Cardiopulmonary status limiting activity;Obesity;Pain      OT Treatment/Interventions: Self-care/ADL training;Therapeutic exercise;Energy conservation;DME and/or AE instruction;Therapeutic activities;Patient/family education;Balance training    OT Goals(Current goals can be found in the care plan section) Acute Rehab OT Goals Patient Stated Goal: to feel better and go home OT Goal Formulation: With patient Time For Goal Achievement: 06/05/20 Potential to Achieve Goals: Good ADL  Goals Pt Will Perform Grooming: with supervision;standing Pt Will Perform Upper Body Bathing: with set-up;with supervision;sitting Pt Will Perform Lower Body Bathing: with supervision;with set-up;with adaptive equipment;sit to/from stand Pt Will Perform Upper Body Dressing: with supervision;with set-up;sitting Pt Will Perform Lower Body Dressing: with supervision;with set-up;with adaptive equipment;sit to/from stand Pt Will Transfer to Toilet: with min guard assist;ambulating;regular height toilet;grab bars Pt Will Perform Toileting - Clothing Manipulation and hygiene: with supervision;sit to/from stand Pt/caregiver will Perform Home Exercise Program: Increased strength;Both right and left upper extremity;With theraband;With Supervision;With written HEP provided Additional ADL Goal #1: Pt will independently incorporate energy conservation strategies during ADLs  OT Frequency: Min 2X/week   Barriers to D/C:            Co-evaluation              AM-PAC OT "6 Clicks" Daily Activity     Outcome Measure Help from another person eating meals?: None Help from another person taking care of personal grooming?: A Little Help from another person toileting, which includes using toliet, bedpan, or urinal?: A Lot Help from another person bathing (including washing, rinsing, drying)?: A Lot Help from another person to put on and taking off regular upper body clothing?: A Little Help from another person to put on and taking off regular lower body clothing?: A Lot 6 Click Score: 16   End of Session  Equipment Utilized During Treatment: Oxygen Nurse Communication: Mobility status  Activity Tolerance: Patient limited by fatigue Patient left: in bed;with call bell/phone within reach  OT Visit Diagnosis: Unsteadiness on feet (R26.81);Pain Pain - part of body:  (peri area)                Time: 8563-1497 OT Time Calculation (min): 58 min Charges:  OT General Charges $OT Visit: 1 Visit OT  Evaluation $OT Eval Moderate Complexity: 1 Mod OT Treatments $Self Care/Home Management : 23-37 mins $Therapeutic Activity: 8-22 mins  Nilsa Nutting., OTR/L Acute Rehabilitation Services Pager [phone] Office [phone]   Lucille Passy M 05/22/2020, 5:34 PM

## 2020-05-22 NOTE — Progress Notes (Signed)
  Echocardiogram 2D Echocardiogram has been performed.  Andrea Perry 05/22/2020, 11:10 AM

## 2020-05-22 NOTE — Progress Notes (Addendum)
HOSPITAL MEDICINE OVERNIGHT EVENT NOTE    Notified by nursing that patient's magnesium this morning 0.8.  4 g of intravenous magnesium sulfate have been ordered.  I have also been notified the patient has been experiencing several bouts of watery diarrhea this evening.  Patient denies any associated abdominal pain.  There is no blood in the stool.  Patient denies any recent antibiotic exposure/use.  Diarrhea is unlikely to be secondary to C. difficile and more likely to be secondary to the Covid infection itself.   [name]  MD [facility]

## 2020-05-22 NOTE — Discharge Instructions (Addendum)
Person Under Monitoring Name: Andrea Perry [facility]  Location: [place] [address]   Infection Prevention Recommendations for Individuals Confirmed to have, or Being Evaluated for, 2019 Novel Coronavirus (COVID-19) Infection Who Receive Care at Home  Individuals who are confirmed to have, or are being evaluated for, COVID-19 should follow the prevention steps below until a healthcare provider or local or state health department says they can return to normal activities.  Stay home except to get medical care You should restrict activities outside your home, except for getting medical care. Do not go to work, school, or public areas, and do not use public transportation or taxis.  Call ahead before visiting your doctor Before your medical appointment, call the healthcare provider and tell them that you have, or are being evaluated for, COVID-19 infection. This will help the healthcare provider's office take steps to keep other people from getting infected. Ask your healthcare provider to call the local or state health department.  Monitor your symptoms Seek prompt medical attention if your illness is worsening (e.g., difficulty breathing). Before going to your medical appointment, call the healthcare provider and tell them that you have, or are being evaluated for, COVID-19 infection. Ask your healthcare provider to call the local or state health department.  Wear a facemask You should wear a facemask that covers your nose and mouth when you are in the same room with other people and when you visit a healthcare provider. People who live with or visit you should also wear a facemask while they are in the same room with you.  Separate yourself from other people in your home As much as possible, you should stay in a different room from other people in your home. Also, you should use a separate bathroom, if available.  Avoid sharing household items You should not  share dishes, drinking glasses, cups, eating utensils, towels, bedding, or other items with other people in your home. After using these items, you should wash them thoroughly with soap and water.  Cover your coughs and sneezes Cover your mouth and nose with a tissue when you cough or sneeze, or you can cough or sneeze into your sleeve. Throw used tissues in a lined trash can, and immediately wash your hands with soap and water for at least 20 seconds or use an alcohol-based hand rub.  Wash your [facility] your hands often and thoroughly with soap and water for at least 20 seconds. You can use an alcohol-based hand sanitizer if soap and water are not available and if your hands are not visibly dirty. Avoid touching your eyes, nose, and mouth with unwashed hands.   Prevention Steps for Caregivers and Household Members of Individuals Confirmed to have, or Being Evaluated for, COVID-19 Infection Being Cared for in the Home  If you live with, or provide care at home for, a person confirmed to have, or being evaluated for, COVID-19 infection please follow these guidelines to prevent infection:  Follow healthcare provider's instructions Make sure that you understand and can help the patient follow any healthcare provider instructions for all care.  Provide for the patient's basic needs You should help the patient with basic needs in the home and provide support for getting groceries, prescriptions, and other personal needs.  Monitor the patient's symptoms If they are getting sicker, call his or her medical provider and tell them that the patient has, or is being evaluated for, COVID-19 infection. This will help the healthcare provider's  office take steps to keep other people from getting infected. Ask the healthcare provider to call the local or state health department.  Limit the number of people who have contact with the patient  If possible, have only one caregiver for the  patient.  Other household members should stay in another home or place of residence. If this is not possible, they should stay  in another room, or be separated from the patient as much as possible. Use a separate bathroom, if available.  Restrict visitors who do not have an essential need to be in the home.  Keep older adults, very young children, and other sick people away from the patient Keep older adults, very young children, and those who have compromised immune systems or chronic health conditions away from the patient. This includes people with chronic heart, lung, or kidney conditions, diabetes, and cancer.  Ensure good ventilation Make sure that shared spaces in the home have good air flow, such as from an air conditioner or an opened window, weather permitting.  Wash your hands often  Wash your hands often and thoroughly with soap and water for at least 20 seconds. You can use an alcohol based hand sanitizer if soap and water are not available and if your hands are not visibly dirty.  Avoid touching your eyes, nose, and mouth with unwashed hands.  Use disposable paper towels to dry your hands. If not available, use dedicated cloth towels and replace them when they become wet.  Wear a facemask and gloves  Wear a disposable facemask at all times in the room and gloves when you touch or have contact with the patient's blood, body fluids, and/or secretions or excretions, such as sweat, saliva, sputum, nasal mucus, vomit, urine, or feces.  Ensure the mask fits over your nose and mouth tightly, and do not touch it during use.  Throw out disposable facemasks and gloves after using them. Do not reuse.  Wash your hands immediately after removing your facemask and gloves.  If your personal clothing becomes contaminated, carefully remove clothing and launder. Wash your hands after handling contaminated clothing.  Place all used disposable facemasks, gloves, and other waste in a lined  container before disposing them with other household waste.  Remove gloves and wash your hands immediately after handling these items.  Do not share dishes, glasses, or other household items with the patient  Avoid sharing household items. You should not share dishes, drinking glasses, cups, eating utensils, towels, bedding, or other items with a patient who is confirmed to have, or being evaluated for, COVID-19 infection.  After the person uses these items, you should wash them thoroughly with soap and water.  Wash laundry thoroughly  Immediately remove and wash clothes or bedding that have blood, body fluids, and/or secretions or excretions, such as sweat, saliva, sputum, nasal mucus, vomit, urine, or feces, on them.  Wear gloves when handling laundry from the patient.  Read and follow directions on labels of laundry or clothing items and detergent. In general, wash and dry with the warmest temperatures recommended on the label.  Clean all areas the individual has used often  Clean all touchable surfaces, such as counters, tabletops, doorknobs, bathroom fixtures, toilets, phones, keyboards, tablets, and bedside tables, every day. Also, clean any surfaces that may have blood, body fluids, and/or secretions or excretions on them.  Wear gloves when cleaning surfaces the patient has come in contact with.  Use a diluted bleach solution (e.g., dilute bleach with 1  part bleach and 10 parts water) or a household disinfectant with a label that says EPA-registered for coronaviruses. To make a bleach solution at home, add 1 tablespoon of bleach to 1 quart (4 cups) of water. For a larger supply, add  cup of bleach to 1 gallon (16 cups) of water.  Read labels of cleaning products and follow recommendations provided on product labels. Labels contain instructions for safe and effective use of the cleaning product including precautions you should take when applying the product, such as wearing gloves or  eye protection and making sure you have good ventilation during use of the product.  Remove gloves and wash hands immediately after cleaning.  Monitor yourself for signs and symptoms of illness Caregivers and household members are considered close contacts, should monitor their health, and will be asked to limit movement outside of the home to the extent possible. Follow the monitoring steps for close contacts listed on the symptom monitoring form.   ? If you have additional questions, contact your local health department or call the epidemiologist on call at [phone] (available 24/7). ? This guidance is subject to change. For the most up-to-date guidance from [facility], please refer to their website: YouBlogs.pl    Information on my medicine - ELIQUIS (apixaban)  Why was Eliquis prescribed for you? Eliquis was prescribed for you to reduce the risk of a blood clot forming that can cause a stroke if you have a medical condition called atrial fibrillation (a type of irregular heartbeat).  What do You need to know about Eliquis ? Take your Eliquis TWICE DAILY - one tablet in the morning and one tablet in the evening with or without food. If you have difficulty swallowing the tablet whole please discuss with your pharmacist how to take the medication safely.  Take Eliquis exactly as prescribed by your doctor and DO NOT stop taking Eliquis without talking to the doctor who prescribed the medication.  Stopping may increase your risk of developing a stroke.  Refill your prescription before you run out.  After discharge, you should have regular check-up appointments with your healthcare provider that is prescribing your Eliquis.  In the future your dose may need to be changed if your kidney function or weight changes by a significant amount or as you get older.  What do you do if you miss a dose? If you miss a dose, take it as  soon as you remember on the same day and resume taking twice daily.  Do not take more than one dose of ELIQUIS at the same time to make up a missed dose.  Important Safety Information A possible side effect of Eliquis is bleeding. You should call your healthcare provider right away if you experience any of the following: ? Bleeding from an injury or your nose that does not stop. ? Unusual colored urine (red or dark brown) or unusual colored stools (red or black). ? Unusual bruising for unknown reasons. ? A serious fall or if you hit your head (even if there is no bleeding).  Some medicines may interact with Eliquis and might increase your risk of bleeding or clotting while on Eliquis. To help avoid this, consult your healthcare provider or pharmacist prior to using any new prescription or non-prescription medications, including herbals, vitamins, non-steroidal anti-inflammatory drugs (NSAIDs) and supplements.  This website has more information on Eliquis (apixaban): http://www.eliquis.com/eliquis/home

## 2020-05-22 NOTE — Progress Notes (Addendum)
PROGRESS NOTE                                                                                                                                                                                                             Patient Demographics:    Andrea Perry, is a 70 y.o. female, DOB - [birth year], [id number]  Outpatient Primary MD for the patient is Biagio Borg, MD   Admit date - 05/21/2020   LOS - 1  Chief Complaint  Patient presents with  . Cough       Brief Narrative: Patient is a 70 y.o. female with PMHx of PAF, DM-2, COPD, hypothyroidism-presenting with cough/shortness of breath-thought to have decompensated diastolic heart failure, A. fib with RVR and COVID-19 infection.  See below for further details.  COVID-19 vaccinated status:   Significant Events: 2/1>> Admit to [facility] for A. fib RVR, decompensated heart failure, COVID-19 infection  Significant studies: 2/1>>Chest x-ray: Pulmonary vascular congestion 2/2>> Echo: EF 50-55%  COVID-19 medications: Remdesivir: 2/1>>  Antibiotics: None  Microbiology data: None  Procedures: None  Consults: None  DVT prophylaxis: apixaban (ELIQUIS) tablet 5 mg     Subjective:   Had diarrhea earlier-better after Imodium.   Assessment  & Plan :   Acute Hypoxic Resp Failure due to decompensated heart failure +/- Covid 19 Viral pneumonia: Suspect primary driver of hypoxemia to be decompensated heart failure-however CRP significantly elevated-therefore not sure if patient has some amount of COVID-19 pneumonia as well.  She continues to have 2+ pitting edema in the lower extremities.  Plans are to diurese-change Lasix to twice daily dosing, have started Remdesivir-add steroids-and repeat imaging tomorrow morning.  Fever: afebrile O2 requirements:  SpO2: 100 % O2 Flow Rate (L/min): 3 L/min   COVID-19 Labs: Recent Labs    05/21/20 1146 05/22/20 0208  DDIMER  0.52* 0.31  FERRITIN 181 230  LDH 336*  --   CRP 15.5* 16.1*       Component Value Date/Time   BNP 152.0 (H) 05/21/2020 0928    Recent Labs  Lab 05/21/20 1146  PROCALCITON <0.10    Lab Results  Component Value Date   SARSCOV2NAA POSITIVE (A) 05/21/2020   Joseph NEGATIVE 05/27/2019   SARSCOV2NAA NOT DETECTED 03/30/2019   SARSCOV2NAA NOT DETECTED 02/06/2019     Prone/Incentive Spirometry: encouraged patient to lie prone  for 3-4 hours at a time for a total of 16 hours a day, and to encourage incentive spirometry use 3-4/hour.  Diarrhea: Due to COVID-19-better with Imodium-follow.  A. fib with RVR: Rate better controlled-continue metoprolol-was loaded with digoxin on admission.  Check digoxin levels in a.m.-continue Eliquis.  COPD: Stable-not in flare-continue bronchodilators.  History of mild OSA: Did not tolerate CPAP per last PCCM note  Hypothyroidism: Continue Synthroid-TSH stable.  Hypomagnesemia: Replete and recheck in a.m.  HTN: BP stable-continue metoprolol and Avapro  DM-2: CBGs on the higher side-at risk for further worsening of hyperglycemia due to steroids-start Levemir 10 units nightly, add 4 units of NovoLog with meals-continue SSI.  Follow and adjust  Recent Labs    05/21/20 2050 05/22/20 0726 05/22/20 1209  GLUCAP 253* 221* 162*   Morbid obesity Estimated body mass index is 49.72 kg/m as calculated from the following:   Height as of this encounter: 5\' 2"  (1.575 m).   Weight as of this encounter: 123.3 kg.    GI prophylaxis: PPI  ABG: No results found for: PHART, PCO2ART, PO2ART, HCO3, TCO2, ACIDBASEDEF, O2SAT  Vent Settings: N/A  Condition - Stable  Family Communication  : None at bedside-we will update over the next few days.  Code Status :  Full Code  Diet :  Diet Order            Diet heart healthy/carb modified Room service appropriate? Yes; Fluid consistency: Thin; Fluid restriction: 2000 mL Fluid  Diet effective now                   Disposition Plan  :   Status is: Inpatient  Remains inpatient appropriate because:Inpatient level of care appropriate due to severity of illness   Dispo: The patient is from: Home              Anticipated d/c is to: Home              Anticipated d/c date is: 3 days              Patient currently is not medically stable to d/c.   Difficult to place patient No    Barriers to discharge: Hypoxia requiring O2 supplementation/complete 5 days of IV Remdesivir  Antimicorbials  :    Anti-infectives (From admission, onward)   Start     Dose/Rate Route Frequency Ordered Stop   05/23/20 1000  remdesivir 100 mg in sodium chloride 0.9 % 100 mL IVPB       "Followed by" Linked Group Details   100 mg 200 mL/hr over 30 Minutes Intravenous Daily 05/22/20 0745 05/25/20 0959   05/22/20 0900  remdesivir 200 mg in sodium chloride 0.9% 250 mL IVPB       "Followed by" Linked Group Details   200 mg 580 mL/hr over 30 Minutes Intravenous Once 05/22/20 0745 05/22/20 1031      Inpatient Medications  Scheduled Meds: . apixaban  5 mg Oral BID  . escitalopram  20 mg Oral Daily  . estradiol  1 mg Oral Daily  . fluticasone furoate-vilanterol  1 puff Inhalation Daily   And  . umeclidinium bromide  1 puff Inhalation Daily  . furosemide  40 mg Intravenous Daily  . insulin aspart  0-20 Units Subcutaneous TID WC  . insulin aspart  0-5 Units Subcutaneous QHS  . irbesartan  150 mg Oral Daily  . levothyroxine  25 mcg Oral Q0600  . loratadine  10 mg Oral Daily  .  metFORMIN  1,000 mg Oral Q breakfast  . metoprolol tartrate  100 mg Oral BID  . multivitamin with minerals  1 tablet Oral Daily  . pantoprazole  40 mg Oral Daily  . potassium chloride  20 mEq Oral Daily  . pravastatin  40 mg Oral Daily  . Ensure Max Protein  11 oz Oral BID  . sodium chloride flush  3 mL Intravenous Q12H   Continuous Infusions: . sodium chloride    . [START ON 05/23/2020] remdesivir 100 mg in NS 100 mL     PRN  Meds:.sodium chloride, acetaminophen, cyclobenzaprine, diphenoxylate-atropine, guaiFENesin, LORazepam, ondansetron (ZOFRAN) IV, sodium chloride flush, tetrahydrozoline   Time Spent in minutes  25  See all Orders from today for further details   Oren Binet M.D on 05/22/2020 at 3:09 PM  To page go to [email or web address] - use universal password  [facility] -  Office  [phone]    Objective:   Vitals:   05/22/20 0035 05/22/20 0435 05/22/20 0438 05/22/20 1300  BP: 140/84 (!) 141/89  (!) 117/56  Pulse: 83 91  91  Resp: (!) 23 16  20   Temp: 98.7 F (37.1 C) 98.7 F (37.1 C)  98 F (36.7 C)  TempSrc: Oral Oral  Oral  SpO2: 100% 98%  100%  Weight:   123.3 kg   Height:        Wt Readings from Last 3 Encounters:  05/22/20 123.3 kg  10/31/19 125.6 kg  09/27/19 123.4 kg     Intake/Output Summary (Last 24 hours) at 05/22/2020 1509 Last data filed at 05/22/2020 1435 Gross per 24 hour  Intake 970 ml  Output 2700 ml  Net -1730 ml     Physical Exam Gen Exam:Alert awake-not in any distress HEENT:atraumatic, normocephalic Chest: Bibasilar rales CVS:S1S2 regular Abdomen:soft non tender, non distended Extremities:++ edema Neurology: Non focal Skin: no rash   Data Review:    CBC Recent Labs  Lab 05/21/20 0928 05/22/20 0208  WBC 8.6 5.5  HGB 12.6 13.4  HCT 39.2 41.2  PLT 234 255  MCV 93.1 92.8  MCH 29.9 30.2  MCHC 32.1 32.5  RDW 13.6 13.5  LYMPHSABS 1.1 0.8  MONOABS 0.6 0.4  EOSABS 0.0 0.0  BASOSABS 0.0 0.0    Chemistries  Recent Labs  Lab 05/21/20 0928 05/21/20 1700 05/22/20 0208  NA 136 135 136  K 2.7* 3.0* 3.5  CL 91* 92* 90*  CO2 27 28 31   GLUCOSE 175* 247* 226*  BUN 6* 6* 9  CREATININE 0.88 0.82 0.78  CALCIUM 6.8* 6.6* 6.9*  MG  --  0.7* 0.8*  AST  --   --  31  ALT  --   --  20  ALKPHOS  --   --  71  BILITOT  --   --  1.3*    ------------------------------------------------------------------------------------------------------------------ Recent Labs    05/21/20 1146  TRIG 163*    Lab Results  Component Value Date   HGBA1C 7.3 (H) 05/21/2020   ------------------------------------------------------------------------------------------------------------------ Recent Labs    05/21/20 1700  TSH 0.591   ------------------------------------------------------------------------------------------------------------------ Recent Labs    05/21/20 1146 05/22/20 0208  FERRITIN 181 230    Coagulation profile No results for input(s): INR, PROTIME in the last 168 hours.  Recent Labs    05/21/20 1146 05/22/20 0208  DDIMER 0.52* 0.31    Cardiac Enzymes No results for input(s): CKMB, TROPONINI, MYOGLOBIN in the last 168 hours.  Invalid input(s): CK ------------------------------------------------------------------------------------------------------------------    Component Value Date/Time  BNP 152.0 (H) 05/21/2020 1660    Micro Results Recent Results (from the past 240 hour(s))  SARS Coronavirus 2 by RT PCR (hospital order, performed in [facility] hospital lab) Nasopharyngeal Nasopharyngeal Swab     Status: Abnormal   Collection Time: 05/21/20  9:28 AM   Specimen: Nasopharyngeal Swab  Result Value Ref Range Status   SARS Coronavirus 2 POSITIVE (A) NEGATIVE Final    Comment: RESULT CALLED TO, READ BACK BY AND VERIFIED WITH: Princess Perna [phone] at 1114 by cm (NOTE) SARS-CoV-2 target nucleic acids are DETECTED  SARS-CoV-2 RNA is generally detectable in upper respiratory specimens  during the acute phase of infection.  Positive results are indicative  of the presence of the identified virus, but do not rule out bacterial infection or co-infection with other pathogens not detected by the test.  Clinical correlation with patient history and  other diagnostic information is necessary to determine  patient infection status.  The expected result is negative.  Fact Sheet for Patients:   StrictlyIdeas.no   Fact Sheet for Healthcare Providers:   BankingDealers.co.za    This test is not yet approved or cleared by the [place] FDA and  has been authorized for detection and/or diagnosis of SARS-CoV-2 by FDA under an Emergency Use Authorization (EUA).  This EUA will remain in effect (meaning this tes t can be used) for the duration of  the COVID-19 declaration under Section 564(b)(1) of the Act, 21 U.S.C. section 360-bbb-3(b)(1), unless the authorization is terminated or revoked sooner.  Performed at [facility], [place] [address] [place], [place] [address]     Radiology Reports DG Chest Portable 1 View  Result Date: 05/21/2020 CLINICAL DATA:  Cough and shortness of breath. EXAM: PORTABLE CHEST 1 VIEW COMPARISON:  12/01/2018 FINDINGS: 0944 hours. Cardiopericardial silhouette is at upper limits of normal for size. There is pulmonary vascular congestion without overt pulmonary edema. No overt airspace pulmonary edema or focal lung consolidation. No substantial pleural effusion. Telemetry leads overlie the chest. IMPRESSION: Pulmonary vascular congestion without acute cardiopulmonary findings. Electronically Signed   By: Misty Stanley M.D.   On: 05/21/2020 09:55   ECHOCARDIOGRAM LIMITED  Result Date: 05/22/2020    ECHOCARDIOGRAM LIMITED REPORT   Patient Name:   KINZEY SHERIFF Date of Exam: 05/22/2020 Medical Rec #:  [id number]     Height:       62.0 in Accession #:    [id number]    Weight:       271.8 lb Date of Birth:  1950-07-16     BSA:          2.178 m Patient Age:    34 years      BP:           141/89 mmHg Patient Gender: F             HR:           91 bpm. Exam Location:  Inpatient Procedure: Limited Echo, Cardiac Doppler and Color Doppler Indications:    CHF  History:        Patient has prior history of Echocardiogram examinations, most                  recent 05/24/2019. COPD, Arrythmias:Atrial Fibrillation,                 Signs/Symptoms:Shortness of Breath and LE edema; Risk  Factors:Hypertension, Dyslipidemia and Obesity.  Sonographer:    Dustin Flock Referring Phys: [id number] [place] Comments: Patient is morbidly obese. Image acquisition challenging due to patient body habitus and Image acquisition challenging due to COPD. COVID+ IMPRESSIONS  1. Left ventricular ejection fraction, by estimation, is 50 to 55%. The left ventricle has low normal function. Left ventricular endocardial border not optimally defined to evaluate regional wall motion. There is mild left ventricular hypertrophy. Left ventricular diastolic parameters are indeterminate.  2. Right ventricle is not well visualized but grossly normal size and systolic function. The estimated right ventricular systolic pressure is 99991111 mmHg.  3. The mitral valve is normal in structure. No evidence of mitral valve regurgitation.  4. The aortic valve was not well visualized. Aortic valve regurgitation is not visualized.  5. The inferior vena cava is dilated in size with >50% respiratory variability, suggesting right atrial pressure of 8 mmHg. FINDINGS  Left Ventricle: Left ventricular ejection fraction, by estimation, is 50 to 55%. The left ventricle has low normal function. Left ventricular endocardial border not optimally defined to evaluate regional wall motion. The left ventricular internal cavity  size was normal in size. There is mild left ventricular hypertrophy. Left ventricular diastolic parameters are indeterminate. Right Ventricle: The right ventricular size is normal. Right ventricular systolic function is normal. There is normal pulmonary artery systolic pressure. The tricuspid regurgitant velocity is 2.57 m/s, and with an assumed right atrial pressure of 8 mmHg,  the estimated right ventricular systolic pressure is 99991111 mmHg. Pericardium: There is  no evidence of pericardial effusion. Mitral Valve: The mitral valve is normal in structure. Tricuspid Valve: The tricuspid valve is normal in structure. Tricuspid valve regurgitation is trivial. Aortic Valve: The aortic valve was not well visualized. Aortic valve regurgitation is not visualized. Pulmonic Valve: The pulmonic valve was not well visualized. Pulmonic valve regurgitation is not visualized. Aorta: The aortic root is normal in size and structure. Venous: The inferior vena cava is dilated in size with greater than 50% respiratory variability, suggesting right atrial pressure of 8 mmHg. IAS/Shunts: The interatrial septum was not well visualized. LEFT VENTRICLE PLAX 2D LVIDd:         4.50 cm  Diastology LVIDs:         3.40 cm  LV e' medial:    8.05 cm/s LV PW:         1.40 cm  LV E/e' medial:  12.2 LV IVS:        1.00 cm  LV e' lateral:   8.27 cm/s LVOT diam:     2.20 cm  LV E/e' lateral: 11.9 LVOT Area:     3.80 cm  RIGHT VENTRICLE RV S prime:     6.64 cm/s LEFT ATRIUM         Index LA diam:    3.70 cm 1.70 cm/m   AORTA Ao Root diam: 2.70 cm MITRAL VALVE               TRICUSPID VALVE MV Area (PHT): 2.73 cm    TR Peak grad:   26.4 mmHg MV Decel Time: 278 msec    TR Vmax:        257.00 cm/s MV E velocity: 98.50 cm/s MV A velocity: 28.10 cm/s  SHUNTS MV E/A ratio:  3.51        Systemic Diam: 2.20 cm Oswaldo Milian MD Electronically signed by Oswaldo Milian MD Signature Date/Time: 05/22/2020/1:25:17 PM    Final

## 2020-05-22 NOTE — Progress Notes (Signed)
CRITICAL VALUE ALERT  Critical Value:  Magnesium 0.8   Date & Time Notied:  05/22/2020 at 0432  Provider Notified: MD Konrad Felix  Orders Received/Actions taken: Magnesium 4gm iv.

## 2020-05-22 NOTE — Evaluation (Signed)
Physical Therapy Evaluation Patient Details Name: Andrea Perry MRN: [id number] DOB: 1950-09-26 Today's Date: 05/22/2020   History of Present Illness  Pt is a 70 y.o. female with medical history significant of paroxysmal A. fib status post cardioversion x2 on Eliquis, COPD/asthma, hypothyroidism, IIDM, morbid obesity, presented with cough, increasing shortness of breath.  Pt admitted with COVID 19 (no signs of PNE), chronic afib with RVR, COPD exacerbation, and acute on chronic CHF.  Clinical Impression  Pt admitted with above diagnosis. Pt presenting with mild deficits in mobility, strength, endurance, safety, and balance.  She had stable O2 sats on 4 L O2. Pt resides with family (24 hr care) and was independent with ambulation and ADLs prior to admission.  Pt expected to progress well.  Pt currently with functional limitations due to the deficits listed below (see PT Problem List). Pt will benefit from skilled PT to increase their independence and safety with mobility to allow discharge to the venue listed below.       Follow Up Recommendations Home health PT    Equipment Recommendations  Rolling walker with 5" wheels    Recommendations for Other Services       Precautions / Restrictions Precautions Precautions: None      Mobility  Bed Mobility Overal bed mobility: Needs Assistance Bed Mobility: Supine to Sit     Supine to sit: Supervision;HOB elevated          Transfers Overall transfer level: Needs assistance Equipment used: None Transfers: Sit to/from Stand Sit to Stand: Min guard         General transfer comment: Performed from bed and bsc without assistance  Ambulation/Gait Ambulation/Gait assistance: Min guard Gait Distance (Feet): 50 Feet Assistive device: Rolling walker (2 wheeled) Gait Pattern/deviations: Step-to pattern;Decreased stride length;Wide base of support Gait velocity: decreased   General Gait Details: Utilized RW for energy conservation and  balance ; pt able to ambulate 50' with min g for safety, cues for RW, and DOE of 2/4  Stairs            Wheelchair Mobility    Modified Rankin (Stroke Patients Only)       Balance Overall balance assessment: Needs assistance Sitting-balance support: No upper extremity supported Sitting balance-Leahy Scale: Good     Standing balance support: No upper extremity supported Standing balance-Leahy Scale: Fair Standing balance comment: RW for ambulation but able to static stand and perform toileting ADLs without assist                             Pertinent Vitals/Pain Pain Assessment: No/denies pain    Home Living Family/patient expects to be discharged to:: Private residence Living Arrangements: Other (Comment) (niece, son, granddaughter) Available Help at Discharge: Family;Available 24 hours/day Type of Home: House Home Access: Stairs to enter Entrance Stairs-Rails: Right Entrance Stairs-Number of Steps: 5 Home Layout: One level Home Equipment: Bedside commode;Shower seat;Walker - standard      Prior Production designer, theatre/television/film / Transfers Assistance Needed: Pt ambulates in house and short community  without AD; uses grocery cart at store  ADL's / Homemaking Assistance Needed: I with ADLs; family assist with IADLs; pt can do light IADLs        Hand Dominance        Extremity/Trunk Assessment   Upper Extremity Assessment Upper Extremity Assessment: Overall WFL for tasks assessed    Lower Extremity Assessment Lower Extremity Assessment: Overall [facility]  for tasks assessed    Cervical / Trunk Assessment Cervical / Trunk Assessment: Normal  Communication      Cognition Arousal/Alertness: Awake/alert Behavior During Therapy: WFL for tasks assessed/performed Overall Cognitive Status: Within Functional Limits for tasks assessed                                 General Comments: very pleasant and motivated      General Comments General  comments (skin integrity, edema, etc.): Pt on 4 L O2 with sats 100% rest and 93% activity; HR 80's    Exercises     Assessment/Plan    PT Assessment Patient needs continued PT services  PT Problem List Decreased strength;Decreased mobility;Decreased activity tolerance;Cardiopulmonary status limiting activity;Decreased balance;Decreased knowledge of use of DME       PT Treatment Interventions DME instruction;Therapeutic activities;Gait training;Therapeutic exercise;Patient/family education;Balance training;Functional mobility training;Stair training    PT Goals (Current goals can be found in the Care Plan section)  Acute Rehab PT Goals Patient Stated Goal: return home PT Goal Formulation: With patient Time For Goal Achievement: 06/05/20 Potential to Achieve Goals: Good    Frequency Min 3X/week   Barriers to discharge        Co-evaluation               AM-PAC PT "6 Clicks" Mobility  Outcome Measure Help needed turning from your back to your side while in a flat bed without using bedrails?: None Help needed moving from lying on your back to sitting on the side of a flat bed without using bedrails?: A Little Help needed moving to and from a bed to a chair (including a wheelchair)?: A Little Help needed standing up from a chair using your arms (e.g., wheelchair or bedside chair)?: A Little Help needed to walk in hospital room?: A Little Help needed climbing 3-5 steps with a railing? : A Little 6 Click Score: 19    End of Session Equipment Utilized During Treatment: Oxygen Activity Tolerance: Patient tolerated treatment well Patient left: in chair;with call bell/phone within reach Nurse Communication: Mobility status PT Visit Diagnosis: Unsteadiness on feet (R26.81);Muscle weakness (generalized) (M62.81)    Time: 1200-1230 PT Time Calculation (min) (ACUTE ONLY): 30 min   Charges:   PT Evaluation $PT Eval Moderate Complexity: 1 Mod PT Treatments $Gait Training:  8-22 mins        Abran Richard, PT Acute Rehab Services Pager [phone] [facility] [place] 05/22/2020, 2:11 PM

## 2020-05-23 ENCOUNTER — Inpatient Hospital Stay (HOSPITAL_COMMUNITY): Payer: Medicare Other

## 2020-05-23 LAB — CBC WITH DIFFERENTIAL/PLATELET
Abs Immature Granulocytes: 0.06 10*3/uL (ref 0.00–0.07)
Basophils Absolute: 0 10*3/uL (ref 0.0–0.1)
Basophils Relative: 0 %
Eosinophils Absolute: 0 10*3/uL (ref 0.0–0.5)
Eosinophils Relative: 0 %
HCT: 39.8 % (ref 36.0–46.0)
Hemoglobin: 13.3 g/dL (ref 12.0–15.0)
Immature Granulocytes: 1 %
Lymphocytes Relative: 6 %
Lymphs Abs: 0.7 10*3/uL (ref 0.7–4.0)
MCH: 30.9 pg (ref 26.0–34.0)
MCHC: 33.4 g/dL (ref 30.0–36.0)
MCV: 92.3 fL (ref 80.0–100.0)
Monocytes Absolute: 0.3 10*3/uL (ref 0.1–1.0)
Monocytes Relative: 3 %
Neutro Abs: 10.7 10*3/uL — ABNORMAL HIGH (ref 1.7–7.7)
Neutrophils Relative %: 90 %
Platelets: 300 10*3/uL (ref 150–400)
RBC: 4.31 MIL/uL (ref 3.87–5.11)
RDW: 13.3 % (ref 11.5–15.5)
WBC: 11.8 10*3/uL — ABNORMAL HIGH (ref 4.0–10.5)
nRBC: 0 % (ref 0.0–0.2)

## 2020-05-23 LAB — GLUCOSE, CAPILLARY
Glucose-Capillary: 150 mg/dL — ABNORMAL HIGH (ref 70–99)
Glucose-Capillary: 208 mg/dL — ABNORMAL HIGH (ref 70–99)
Glucose-Capillary: 211 mg/dL — ABNORMAL HIGH (ref 70–99)
Glucose-Capillary: 240 mg/dL — ABNORMAL HIGH (ref 70–99)

## 2020-05-23 LAB — COMPREHENSIVE METABOLIC PANEL
ALT: 22 U/L (ref 0–44)
AST: 32 U/L (ref 15–41)
Albumin: 2.6 g/dL — ABNORMAL LOW (ref 3.5–5.0)
Alkaline Phosphatase: 72 U/L (ref 38–126)
Anion gap: 13 (ref 5–15)
BUN: 16 mg/dL (ref 8–23)
CO2: 33 mmol/L — ABNORMAL HIGH (ref 22–32)
Calcium: 6.5 mg/dL — ABNORMAL LOW (ref 8.9–10.3)
Chloride: 91 mmol/L — ABNORMAL LOW (ref 98–111)
Creatinine, Ser: 0.82 mg/dL (ref 0.44–1.00)
GFR, Estimated: >60 mL/min (ref >60)
Glucose, Bld: 168 mg/dL — ABNORMAL HIGH (ref 70–99)
Potassium: 3.3 mmol/L — ABNORMAL LOW (ref 3.5–5.1)
Sodium: 137 mmol/L (ref 135–145)
Total Bilirubin: 0.9 mg/dL (ref 0.3–1.2)
Total Protein: 6.3 g/dL — ABNORMAL LOW (ref 6.5–8.1)

## 2020-05-23 LAB — MAGNESIUM: Magnesium: 1.5 mg/dL — ABNORMAL LOW (ref 1.7–2.4)

## 2020-05-23 LAB — D-DIMER, QUANTITATIVE: D-Dimer, Quant: 0.31 ug/mL-FEU (ref 0.00–0.50)

## 2020-05-23 LAB — FERRITIN: Ferritin: 201 ng/mL (ref 11–307)

## 2020-05-23 LAB — DIGOXIN LEVEL: Digoxin Level: 0.9 ng/mL (ref 0.8–2.0)

## 2020-05-23 LAB — C-REACTIVE PROTEIN: CRP: 7.9 mg/dL — ABNORMAL HIGH (ref <1.0)

## 2020-05-23 MED ORDER — POTASSIUM CHLORIDE CRYS ER 20 MEQ PO TBCR
40.0000 meq | EXTENDED_RELEASE_TABLET | Freq: Four times a day (QID) | ORAL | Status: AC
  Administered 2020-05-23 (×2): 40 meq via ORAL
  Filled 2020-05-23 (×2): qty 2

## 2020-05-23 MED ORDER — MAGNESIUM SULFATE 4 GM/100ML IV SOLN
4.0000 g | Freq: Once | INTRAVENOUS | Status: AC
  Administered 2020-05-23: 4 g via INTRAVENOUS
  Filled 2020-05-23: qty 100

## 2020-05-23 MED ORDER — POTASSIUM CHLORIDE CRYS ER 20 MEQ PO TBCR
20.0000 meq | EXTENDED_RELEASE_TABLET | Freq: Every day | ORAL | Status: DC
  Administered 2020-05-24 – 2020-05-25 (×2): 20 meq via ORAL
  Filled 2020-05-23 (×2): qty 1

## 2020-05-23 NOTE — Progress Notes (Signed)
PROGRESS NOTE                                                                                                                                                                                                             Patient Demographics:    Andrea Perry, is a 70 y.o. female, DOB - Sep 16, 1950, YQI:[id number]  Outpatient Primary MD for the patient is Biagio Borg, MD   Admit date - 05/21/2020   LOS - 2  Chief Complaint  Patient presents with  . Cough       Brief Narrative: Patient is a 70 y.o. female with PMHx of PAF, DM-2, COPD, hypothyroidism-presenting with cough/shortness of breath-thought to have decompensated diastolic heart failure, A. fib with RVR and COVID-19 infection.  See below for further details.  COVID-19 vaccinated status: Unvaccinated  Significant Events: 2/1>> Admit to [facility] for A. fib RVR, decompensated heart failure, COVID-19 infection  Significant studies: 2/1>>Chest x-ray: Pulmonary vascular congestion 2/2>> Echo: EF 50-55%  COVID-19 medications: Remdesivir: 2/1>>  Antibiotics: None  Microbiology data: None  Procedures: None  Consults: None  DVT prophylaxis: apixaban (ELIQUIS) tablet 5 mg     Subjective:   Breathing is better-she was on room air this morning.   Assessment  & Plan :   Acute Hypoxic Resp Failure due to decompensated heart failure +/- Covid 19 Viral pneumonia: Suspect primary driver of hypoxemia to be decompensated diastolic heart failure-given significant elevated CRP-May have a COVID-19 pneumonic component as well.  Improving-significant decrease in lower extremity edema today-on room air-plans are to continue IV Lasix/steroids and Remdesivir for now.  May need to be assessed for home O2 requirement prior to discharge.    Fever: afebrile O2 requirements:  SpO2: (!) 87 % O2 Flow Rate (L/min): 1 L/min   COVID-19 Labs: Recent Labs    05/21/20 1146  05/22/20 0208 05/23/20 0122  DDIMER 0.52* 0.31 0.31  FERRITIN 181 230 201  LDH 336*  --   --   CRP 15.5* 16.1* 7.9*       Component Value Date/Time   BNP 152.0 (H) 05/21/2020 0928    Recent Labs  Lab 05/21/20 1146  PROCALCITON <0.10    Lab Results  Component Value Date   SARSCOV2NAA POSITIVE (A) 05/21/2020   SARSCOV2NAA NEGATIVE 05/27/2019   SARSCOV2NAA NOT DETECTED 03/30/2019   SARSCOV2NAA NOT  DETECTED 02/06/2019     Prone/Incentive Spirometry: encouraged patient to lie prone for 3-4 hours at a time for a total of 16 hours a day, and to encourage incentive spirometry use 3-4/hour.  Diarrhea: Due to COVID-19-resolved-continue as needed Imodium.  Chronic A. fib with RVR: Rate better controlled-on metoprolol-remains on Eliquis.  Per patient-she has failed cardioversion-reviewed outpatient cardiology note-noted plans to continue with rate control.    COPD: Stable-not in flare-continue bronchodilators.  History of mild OSA: Did not tolerate CPAP per last PCCM note  Hypothyroidism: Continue Synthroid-TSH stable.  Hypomagnesemia: Continue to replete-recheck in a.m.  Hypomagnesemia: Replete and recheck.  HTN: BP stable-continue metoprolol and Avapro  DM-2: CBGs somewhat better-just started on Levemir daily, 4 units of NovoLog yesterday-reassess on 2/4.  Continue SSI.    Recent Labs    05/22/20 2023 05/23/20 0745 05/23/20 1224  GLUCAP 230* 150* 208*   Morbid obesity Estimated body mass index is 50.36 kg/m as calculated from the following:   Height as of this encounter: 5\' 2"  (1.575 m).   Weight as of this encounter: 124.9 kg.    GI prophylaxis: PPI  ABG: No results found for: PHART, PCO2ART, PO2ART, HCO3, TCO2, ACIDBASEDEF, O2SAT  Vent Settings: N/A  Condition - Stable  Family Communication  : None at bedside-we will update over the next few days.  Code Status :  Full Code  Diet :  Diet Order            Diet heart healthy/carb modified Room  service appropriate? Yes; Fluid consistency: Thin; Fluid restriction: 2000 mL Fluid  Diet effective now                  Disposition Plan  :   Status is: Inpatient  Remains inpatient appropriate because:Inpatient level of care appropriate due to severity of illness   Dispo: The patient is from: Home              Anticipated d/c is to: Home              Anticipated d/c date is: 3 days              Patient currently is not medically stable to d/c.   Difficult to place patient No    Barriers to discharge: Hypoxia requiring O2 supplementation/complete 5 days of IV Remdesivir  Antimicorbials  :    Anti-infectives (From admission, onward)   Start     Dose/Rate Route Frequency Ordered Stop   05/23/20 1000  remdesivir 100 mg in sodium chloride 0.9 % 100 mL IVPB       "Followed by" Linked Group Details   100 mg 200 mL/hr over 30 Minutes Intravenous Daily 05/22/20 0745 05/25/20 0959   05/22/20 0900  remdesivir 200 mg in sodium chloride 0.9% 250 mL IVPB       "Followed by" Linked Group Details   200 mg 580 mL/hr over 30 Minutes Intravenous Once 05/22/20 0745 05/22/20 1031      Inpatient Medications  Scheduled Meds: . apixaban  5 mg Oral BID  . escitalopram  20 mg Oral Daily  . estradiol  1 mg Oral Daily  . fluticasone furoate-vilanterol  1 puff Inhalation Daily   And  . umeclidinium bromide  1 puff Inhalation Daily  . furosemide  40 mg Intravenous Q12H  . insulin aspart  0-20 Units Subcutaneous TID WC  . insulin aspart  0-5 Units Subcutaneous QHS  . insulin aspart  4 Units Subcutaneous TID WC  .  insulin detemir  10 Units Subcutaneous QHS  . irbesartan  150 mg Oral Daily  . levothyroxine  25 mcg Oral Q0600  . loratadine  10 mg Oral Daily  . methylPREDNISolone (SOLU-MEDROL) injection  40 mg Intravenous BID  . metoprolol tartrate  100 mg Oral BID  . multivitamin with minerals  1 tablet Oral Daily  . pantoprazole  40 mg Oral Daily  . [START ON 05/24/2020] potassium chloride   20 mEq Oral Daily  . pravastatin  40 mg Oral Daily  . Ensure Max Protein  11 oz Oral BID  . sodium chloride flush  3 mL Intravenous Q12H   Continuous Infusions: . sodium chloride    . remdesivir 100 mg in NS 100 mL Stopped (05/23/20 0940)   PRN Meds:.sodium chloride, acetaminophen, cyclobenzaprine, diphenoxylate-atropine, guaiFENesin, LORazepam, ondansetron (ZOFRAN) IV, sodium chloride flush, tetrahydrozoline   Time Spent in minutes  25  See all Orders from today for further details   Oren Binet M.D on 05/23/2020 at 1:54 PM  To page go to [email or web address] - use universal password  [facility] -  Office  [phone]    Objective:   Vitals:   05/22/20 2130 05/22/20 2149 05/23/20 0440 05/23/20 0603  BP: 93/68 (!) 111/58 120/79   Pulse: 95 87 82 79  Resp: 20 19 19 18   Temp:   97.6 F (36.4 C)   TempSrc:   Oral   SpO2: 92%  90% (!) 87%  Weight:   124.9 kg   Height:        Wt Readings from Last 3 Encounters:  05/23/20 124.9 kg  10/31/19 125.6 kg  09/27/19 123.4 kg     Intake/Output Summary (Last 24 hours) at 05/23/2020 1354 Last data filed at 05/23/2020 1030 Gross per 24 hour  Intake 990 ml  Output 2050 ml  Net -1060 ml     Physical Exam Gen Exam:Alert awake-not in any distress HEENT:atraumatic, normocephalic Chest: Few bibasilar rales CVS:S1S2 irregular Abdomen:soft non tender, non distended Extremities:+ edema Neurology: Non focal Skin: no rash   Data Review:    CBC Recent Labs  Lab 05/21/20 0928 05/22/20 0208 05/23/20 0122  WBC 8.6 5.5 11.8*  HGB 12.6 13.4 13.3  HCT 39.2 41.2 39.8  PLT 234 255 300  MCV 93.1 92.8 92.3  MCH 29.9 30.2 30.9  MCHC 32.1 32.5 33.4  RDW 13.6 13.5 13.3  LYMPHSABS 1.1 0.8 0.7  MONOABS 0.6 0.4 0.3  EOSABS 0.0 0.0 0.0  BASOSABS 0.0 0.0 0.0    Chemistries  Recent Labs  Lab 05/21/20 0928 05/21/20 1700 05/22/20 0208 05/23/20 0122  NA 136 135 136 137  K 2.7* 3.0* 3.5 3.3*  CL 91* 92* 90* 91*  CO2 27 28  31  33*  GLUCOSE 175* 247* 226* 168*  BUN 6* 6* 9 16  CREATININE 0.88 0.82 0.78 0.82  CALCIUM 6.8* 6.6* 6.9* 6.5*  MG  --  0.7* 0.8* 1.5*  AST  --   --  31 32  ALT  --   --  20 22  ALKPHOS  --   --  71 72  BILITOT  --   --  1.3* 0.9   ------------------------------------------------------------------------------------------------------------------ Recent Labs    05/21/20 1146  TRIG 163*    Lab Results  Component Value Date   HGBA1C 7.3 (H) 05/21/2020   ------------------------------------------------------------------------------------------------------------------ Recent Labs    05/21/20 1700  TSH 0.591   ------------------------------------------------------------------------------------------------------------------ Recent Labs    05/22/20 0208 05/23/20 0122  FERRITIN 230 201  Coagulation profile No results for input(s): INR, PROTIME in the last 168 hours.  Recent Labs    05/22/20 0208 05/23/20 0122  DDIMER 0.31 0.31    Cardiac Enzymes No results for input(s): CKMB, TROPONINI, MYOGLOBIN in the last 168 hours.  Invalid input(s): CK ------------------------------------------------------------------------------------------------------------------    Component Value Date/Time   BNP 152.0 (H) 05/21/2020 [id number]    Micro Results Recent Results (from the past 240 hour(s))  SARS Coronavirus 2 by RT PCR (hospital order, performed in [facility] hospital lab) Nasopharyngeal Nasopharyngeal Swab     Status: Abnormal   Collection Time: 05/21/20  9:28 AM   Specimen: Nasopharyngeal Swab  Result Value Ref Range Status   SARS Coronavirus 2 POSITIVE (A) NEGATIVE Final    Comment: RESULT CALLED TO, READ BACK BY AND VERIFIED WITH: Princess Perna [phone] at 1114 by cm (NOTE) SARS-CoV-2 target nucleic acids are DETECTED  SARS-CoV-2 RNA is generally detectable in upper respiratory specimens  during the acute phase of infection.  Positive results are indicative  of the  presence of the identified virus, but do not rule out bacterial infection or co-infection with other pathogens not detected by the test.  Clinical correlation with patient history and  other diagnostic information is necessary to determine patient infection status.  The expected result is negative.  Fact Sheet for Patients:   StrictlyIdeas.no   Fact Sheet for Healthcare Providers:   BankingDealers.co.za    This test is not yet approved or cleared by the [place] FDA and  has been authorized for detection and/or diagnosis of SARS-CoV-2 by FDA under an Emergency Use Authorization (EUA).  This EUA will remain in effect (meaning this tes t can be used) for the duration of  the COVID-19 declaration under Section 564(b)(1) of the Act, 21 U.S.C. section 360-bbb-3(b)(1), unless the authorization is terminated or revoked sooner.  Performed at [facility], [place] [address], [place], [place] [address]     Radiology Reports DG Chest [place] 1 View  Result Date: 05/23/2020 CLINICAL DATA:  Shortness of breath.  COVID. EXAM: PORTABLE CHEST 1 VIEW COMPARISON:  05/21/2020. FINDINGS: Mediastinum hilar structures normal. Cardiomegaly. No pulmonary venous congestion. Low lung volumes with mild bibasilar atelectasis. Mild left base infiltrate cannot be excluded. No pleural effusion or pneumothorax. Degenerative change thoracic spine. IMPRESSION: 1. Low lung volumes with mild bibasilar atelectasis. Mild left base infiltrate cannot be excluded. 2.  Stable cardiomegaly.  No pulmonary venous congestion. Electronically Signed   By: Marcello Moores  Register   On: 05/23/2020 06:38   DG Chest Portable 1 View  Result Date: 05/21/2020 CLINICAL DATA:  Cough and shortness of breath. EXAM: PORTABLE CHEST 1 VIEW COMPARISON:  12/01/2018 FINDINGS: 0944 hours. Cardiopericardial silhouette is at upper limits of normal for size. There is pulmonary vascular congestion without overt  pulmonary edema. No overt airspace pulmonary edema or focal lung consolidation. No substantial pleural effusion. Telemetry leads overlie the chest. IMPRESSION: Pulmonary vascular congestion without acute cardiopulmonary findings. Electronically Signed   By: Misty Stanley M.D.   On: 05/21/2020 09:55   ECHOCARDIOGRAM LIMITED  Result Date: 05/22/2020    ECHOCARDIOGRAM LIMITED REPORT   Patient Name:   ALYANA KEMPTER Date of Exam: 05/22/2020 Medical Rec #:  [id number]     Height:       62.0 in Accession #:    [id number]    Weight:       271.8 lb Date of Birth:  July 01, 1950     BSA:  2.178 m Patient Age:    [age] years      BP:           141/89 mmHg Patient Gender: F             HR:           91 bpm. Exam Location:  Inpatient Procedure: Limited Echo, Cardiac Doppler and Color Doppler Indications:    CHF  History:        Patient has prior history of Echocardiogram examinations, most                 recent 05/24/2019. COPD, Arrythmias:Atrial Fibrillation,                 Signs/Symptoms:Shortness of Breath and LE edema; Risk                 Factors:Hypertension, Dyslipidemia and Obesity.  Sonographer:    Lavenia AtlasBrooke Strickland Referring Phys: [id number] Emeline GeneralPING T ZHANG  Sonographer Comments: Patient is morbidly obese. Image acquisition challenging due to patient body habitus and Image acquisition challenging due to COPD. COVID+ IMPRESSIONS  1. Left ventricular ejection fraction, by estimation, is 50 to 55%. The left ventricle has low normal function. Left ventricular endocardial border not optimally defined to evaluate regional wall motion. There is mild left ventricular hypertrophy. Left ventricular diastolic parameters are indeterminate.  2. Right ventricle is not well visualized but grossly normal size and systolic function. The estimated right ventricular systolic pressure is 34.4 mmHg.  3. The mitral valve is normal in structure. No evidence of mitral valve regurgitation.  4. The aortic valve was not well visualized. Aortic valve  regurgitation is not visualized.  5. The inferior vena cava is dilated in size with >50% respiratory variability, suggesting right atrial pressure of 8 mmHg. FINDINGS  Left Ventricle: Left ventricular ejection fraction, by estimation, is 50 to 55%. The left ventricle has low normal function. Left ventricular endocardial border not optimally defined to evaluate regional wall motion. The left ventricular internal cavity  size was normal in size. There is mild left ventricular hypertrophy. Left ventricular diastolic parameters are indeterminate. Right Ventricle: The right ventricular size is normal. Right ventricular systolic function is normal. There is normal pulmonary artery systolic pressure. The tricuspid regurgitant velocity is 2.57 m/s, and with an assumed right atrial pressure of 8 mmHg,  the estimated right ventricular systolic pressure is 34.4 mmHg. Pericardium: There is no evidence of pericardial effusion. Mitral Valve: The mitral valve is normal in structure. Tricuspid Valve: The tricuspid valve is normal in structure. Tricuspid valve regurgitation is trivial. Aortic Valve: The aortic valve was not well visualized. Aortic valve regurgitation is not visualized. Pulmonic Valve: The pulmonic valve was not well visualized. Pulmonic valve regurgitation is not visualized. Aorta: The aortic root is normal in size and structure. Venous: The inferior vena cava is dilated in size with greater than 50% respiratory variability, suggesting right atrial pressure of 8 mmHg. IAS/Shunts: The interatrial septum was not well visualized. LEFT VENTRICLE PLAX 2D LVIDd:         4.50 cm  Diastology LVIDs:         3.40 cm  LV e' medial:    8.05 cm/s LV PW:         1.40 cm  LV E/e' medial:  12.2 LV IVS:        1.00 cm  LV e' lateral:   8.27 cm/s LVOT diam:     2.20 cm  LV E/e' lateral:  11.9 LVOT Area:     3.80 cm  RIGHT VENTRICLE RV S prime:     6.64 cm/s LEFT ATRIUM         Index LA diam:    3.70 cm 1.70 cm/m   AORTA Ao Root  diam: 2.70 cm MITRAL VALVE               TRICUSPID VALVE MV Area (PHT): 2.73 cm    TR Peak grad:   26.4 mmHg MV Decel Time: 278 msec    TR Vmax:        257.00 cm/s MV E velocity: 98.50 cm/s MV A velocity: 28.10 cm/s  SHUNTS MV E/A ratio:  3.51        Systemic Diam: 2.20 cm Oswaldo Milian MD Electronically signed by Oswaldo Milian MD Signature Date/Time: 05/22/2020/1:25:17 PM    Final

## 2020-05-23 NOTE — Progress Notes (Signed)
Physical Therapy Treatment Patient Details Name: Andrea Perry MRN: [id number] DOB: 03-29-1951 Today's Date: 05/23/2020    History of Present Illness Pt is a 70 y.o. female with medical history significant of paroxysmal A. fib status post cardioversion x2 on Eliquis, COPD/asthma, hypothyroidism, IIDM, morbid obesity, presented with cough, increasing shortness of breath.  Pt admitted with COVID 19 (no signs of PNE), chronic afib with RVR, COPD exacerbation, and acute on chronic CHF.    PT Comments    Pt received in bed on RA. SpO2 92% at rest. Mobilized on RA with desat to 85%. Placed on 2L in recliner to recover to 96% then returned to RA. Pt required supervision bed mobility, min guard assist transfers and min guard assist ambulation. Pt performed BLE ex in recliner.    Follow Up Recommendations  Home health PT     Equipment Recommendations  Rolling walker with 5" wheels    Recommendations for Other Services       Precautions / Restrictions Precautions Precautions: None    Mobility  Bed Mobility Overal bed mobility: Needs Assistance Bed Mobility: Supine to Sit     Supine to sit: Supervision;HOB elevated     General bed mobility comments: +rail  Transfers Overall transfer level: Needs assistance Equipment used: None Transfers: Sit to/from [employer] Transfers Sit to Stand: Min guard Stand pivot transfers: Min guard       General transfer comment: min guard for safety/balance  Ambulation/Gait Ambulation/Gait assistance: Min guard Gait Distance (Feet): 10 Feet Assistive device: None Gait Pattern/deviations: Wide base of support;Step-through pattern;Decreased stride length Gait velocity: decreased   General Gait Details: Declined need for RW due to short amb distance bed to recliner.   Stairs             Wheelchair Mobility    Modified Rankin (Stroke Patients Only)       Balance Overall balance assessment: Needs assistance Sitting-balance  support: No upper extremity supported;Feet supported Sitting balance-Leahy Scale: Good     Standing balance support: No upper extremity supported;During functional activity Standing balance-Leahy Scale: Fair                              Cognition Arousal/Alertness: Awake/alert Behavior During Therapy: WFL for tasks assessed/performed Overall Cognitive Status: Within Functional Limits for tasks assessed                                        Exercises General Exercises - Lower Extremity Ankle Circles/Pumps: AROM;Both;10 reps Heel Slides: AROM;Right;Left;5 reps    General Comments General comments (skin integrity, edema, etc.): SpO2 92% at rest on RA. Desat to 85% during mobility. 2L in recliner to recover to 96%. Pt returned to RA at end of session.      Pertinent Vitals/Pain Pain Assessment: No/denies pain    Home Living                      Prior Function            PT Goals (current goals can now be found in the care plan section) Acute Rehab PT Goals Patient Stated Goal: home Progress towards PT goals: Progressing toward goals    Frequency    Min 3X/week      PT Plan Current plan remains appropriate    Co-evaluation  AM-PAC PT "6 Clicks" Mobility   Outcome Measure  Help needed turning from your back to your side while in a flat bed without using bedrails?: None Help needed moving from lying on your back to sitting on the side of a flat bed without using bedrails?: A Little Help needed moving to and from a bed to a chair (including a wheelchair)?: A Little Help needed standing up from a chair using your arms (e.g., wheelchair or bedside chair)?: A Little Help needed to walk in hospital room?: A Little Help needed climbing 3-5 steps with a railing? : A Little 6 Click Score: 19    End of Session Equipment Utilized During Treatment: Oxygen Activity Tolerance: Patient tolerated treatment well Patient  left: in chair;with call bell/phone within reach Nurse Communication: Mobility status PT Visit Diagnosis: Unsteadiness on feet (R26.81);Muscle weakness (generalized) (M62.81)     Time: 3491-7915 PT Time Calculation (min) (ACUTE ONLY): 17 min  Charges:  $Gait Training: 8-22 mins                     Lorrin Goodell, PT  Office # [phone] Pager [phone]    Lorriane Shire 05/23/2020, 12:36 PM

## 2020-05-24 ENCOUNTER — Encounter (HOSPITAL_COMMUNITY): Payer: Self-pay | Admitting: Internal Medicine

## 2020-05-24 LAB — CBC WITH DIFFERENTIAL/PLATELET
Abs Immature Granulocytes: 0.08 10*3/uL — ABNORMAL HIGH (ref 0.00–0.07)
Basophils Absolute: 0 10*3/uL (ref 0.0–0.1)
Basophils Relative: 0 %
Eosinophils Absolute: 0 10*3/uL (ref 0.0–0.5)
Eosinophils Relative: 0 %
HCT: 39 % (ref 36.0–46.0)
Hemoglobin: 12.9 g/dL (ref 12.0–15.0)
Immature Granulocytes: 1 %
Lymphocytes Relative: 7 %
Lymphs Abs: 0.8 10*3/uL (ref 0.7–4.0)
MCH: 30.4 pg (ref 26.0–34.0)
MCHC: 33.1 g/dL (ref 30.0–36.0)
MCV: 91.8 fL (ref 80.0–100.0)
Monocytes Absolute: 0.6 10*3/uL (ref 0.1–1.0)
Monocytes Relative: 5 %
Neutro Abs: 9.9 10*3/uL — ABNORMAL HIGH (ref 1.7–7.7)
Neutrophils Relative %: 87 %
Platelets: 316 10*3/uL (ref 150–400)
RBC: 4.25 MIL/uL (ref 3.87–5.11)
RDW: 13.4 % (ref 11.5–15.5)
WBC: 11.3 10*3/uL — ABNORMAL HIGH (ref 4.0–10.5)
nRBC: 0 % (ref 0.0–0.2)

## 2020-05-24 LAB — COMPREHENSIVE METABOLIC PANEL
ALT: 23 U/L (ref 0–44)
AST: 28 U/L (ref 15–41)
Albumin: 2.5 g/dL — ABNORMAL LOW (ref 3.5–5.0)
Alkaline Phosphatase: 82 U/L (ref 38–126)
Anion gap: 12 (ref 5–15)
BUN: 20 mg/dL (ref 8–23)
CO2: 35 mmol/L — ABNORMAL HIGH (ref 22–32)
Calcium: 6.9 mg/dL — ABNORMAL LOW (ref 8.9–10.3)
Chloride: 91 mmol/L — ABNORMAL LOW (ref 98–111)
Creatinine, Ser: 0.77 mg/dL (ref 0.44–1.00)
GFR, Estimated: >60 mL/min (ref >60)
Glucose, Bld: 183 mg/dL — ABNORMAL HIGH (ref 70–99)
Potassium: 3.8 mmol/L (ref 3.5–5.1)
Sodium: 138 mmol/L (ref 135–145)
Total Bilirubin: 0.6 mg/dL (ref 0.3–1.2)
Total Protein: 6.3 g/dL — ABNORMAL LOW (ref 6.5–8.1)

## 2020-05-24 LAB — URINALYSIS, ROUTINE W REFLEX MICROSCOPIC
Bilirubin Urine: NEGATIVE
Glucose, UA: NEGATIVE mg/dL
Hgb urine dipstick: NEGATIVE
Ketones, ur: NEGATIVE mg/dL
Nitrite: NEGATIVE
Protein, ur: NEGATIVE mg/dL
Specific Gravity, Urine: 1.013 (ref 1.005–1.030)
pH: 8 (ref 5.0–8.0)

## 2020-05-24 LAB — MAGNESIUM: Magnesium: 2 mg/dL (ref 1.7–2.4)

## 2020-05-24 LAB — D-DIMER, QUANTITATIVE: D-Dimer, Quant: 0.31 ug/mL-FEU (ref 0.00–0.50)

## 2020-05-24 LAB — GLUCOSE, CAPILLARY
Glucose-Capillary: 192 mg/dL — ABNORMAL HIGH (ref 70–99)
Glucose-Capillary: 166 mg/dL — ABNORMAL HIGH (ref 70–99)
Glucose-Capillary: 226 mg/dL — ABNORMAL HIGH (ref 70–99)
Glucose-Capillary: 238 mg/dL — ABNORMAL HIGH (ref 70–99)

## 2020-05-24 LAB — C-REACTIVE PROTEIN: CRP: 4 mg/dL — ABNORMAL HIGH (ref <1.0)

## 2020-05-24 LAB — FERRITIN: Ferritin: 183 ng/mL (ref 11–307)

## 2020-05-24 NOTE — Progress Notes (Signed)
PROGRESS NOTE                                                                                                                                                                                                             Patient Demographics:    Andrea Perry, is a 70 y.o. female, DOB - [birth year], [id number]  Outpatient Primary MD for the patient is Biagio Borg, MD   Admit date - 05/21/2020   LOS - 3  Chief Complaint  Patient presents with  . Cough       Brief Narrative: Patient is a 70 y.o. female with PMHx of PAF, DM-2, COPD, hypothyroidism-presenting with cough/shortness of breath-thought to have decompensated diastolic heart failure, A. fib with RVR and COVID-19 infection.  See below for further details.  COVID-19 vaccinated status: Unvaccinated  Significant Events: 2/1>> Admit to [facility] for A. fib RVR, decompensated heart failure, COVID-19 infection  Significant studies: 2/1>>Chest x-ray: Pulmonary vascular congestion 2/2>> Echo: EF 50-55%  COVID-19 medications: Remdesivir: 2/1>>  Antibiotics: None  Microbiology data: None  Procedures: None  Consults: None  DVT prophylaxis: apixaban (ELIQUIS) tablet 5 mg     Subjective:   Breathing is better-she was on room air this morning.  Feels weak-and continues to have coughing spells.   Assessment  & Plan :   Acute Hypoxic Resp Failure due to decompensated heart failure +/- Covid 19 Viral pneumonia:  Suspect primary etiology of hypoxemia is in decompensated heart failure-given significantly elevated CRP-probably has some amount of COVID-19 pneumonia as well.  Slowly improving-continues to have some coughing spells today-volume status is better but still has some lower extremity edema.  Continue IV Lasix-Taper steroids-remains on Remdesivir.  Suspect needs another day or two of hospitalization to optimize volume status and COVID-19 infection before  consideration of discharge.    Fever: afebrile O2 requirements:  SpO2: 93 % O2 Flow Rate (L/min): 1 L/min   COVID-19 Labs: Recent Labs    05/22/20 0208 05/23/20 0122 05/24/20 0107  DDIMER 0.31 0.31 0.31  FERRITIN 230 201 183  CRP 16.1* 7.9* 4.0*       Component Value Date/Time   BNP 152.0 (H) 05/21/2020 0928    Recent Labs  Lab 05/21/20 1146  PROCALCITON <0.10    Lab Results  Component Value Date   SARSCOV2NAA POSITIVE (A) 05/21/2020  [place] NEGATIVE 05/27/2019   SARSCOV2NAA NOT DETECTED 03/30/2019   SARSCOV2NAA NOT DETECTED 02/06/2019     Prone/Incentive Spirometry: encouraged patient to lie prone for 3-4 hours at a time for a total of 16 hours a day, and to encourage incentive spirometry use 3-4/hour.  Diarrhea: Due to COVID-19-resolved-continue as needed Imodium.  Chronic A. fib with RVR: Rate better controlled-on metoprolol-remains on Eliquis.  Per patient-she has failed cardioversion-reviewed outpatient cardiology note-noted plans to continue with rate control.    COPD: Stable-not in flare-continue bronchodilators.  History of mild OSA: Did not tolerate CPAP per last PCCM note  Hypothyroidism: Continue Synthroid-TSH stable.  Hypomagnesemia: Repleted  Hypomagnesemia: Repleted  HTN: BP stable-continue metoprolol and Avapro  DM-2: CBGs better-somewhat better-continue 10 units of Levemir, 4 units of NovoLog with meals and SSI.  Follow and adjust.    Recent Labs    05/23/20 2110 05/24/20 0742 05/24/20 1213  GLUCAP 240* 192* 166*   Morbid obesity Estimated body mass index is 50.36 kg/m as calculated from the following:   Height as of this encounter: 5\' 2"  (1.575 m).   Weight as of this encounter: 124.9 kg.    GI prophylaxis: PPI  ABG: No results found for: PHART, PCO2ART, PO2ART, HCO3, TCO2, ACIDBASEDEF, O2SAT  Vent Settings: N/A  Condition - Stable  Family Communication  : Patient prefers to update family herself.  Code Status :   Full Code  Diet :  Diet Order            Diet heart healthy/carb modified Room service appropriate? Yes; Fluid consistency: Thin; Fluid restriction: 2000 mL Fluid  Diet effective now                  Disposition Plan  :   Status is: Inpatient  Remains inpatient appropriate because:Inpatient level of care appropriate due to severity of illness   Dispo: The patient is from: Home              Anticipated d/c is to: Home              Anticipated d/c date is: 1-2 days              Patient currently is not medically stable to d/c.   Difficult to place patient No    Barriers to discharge: Continue IV Lasix-optimize volume status further-on IV steroids and Remdesivir for COVID-19 pneumonia  Antimicorbials  :    Anti-infectives (From admission, onward)   Start     Dose/Rate Route Frequency Ordered Stop   05/23/20 1000  remdesivir 100 mg in sodium chloride 0.9 % 100 mL IVPB       "Followed by" Linked Group Details   100 mg 200 mL/hr over 30 Minutes Intravenous Daily 05/22/20 0745 05/24/20 1053   05/22/20 0900  remdesivir 200 mg in sodium chloride 0.9% 250 mL IVPB       "Followed by" Linked Group Details   200 mg 580 mL/hr over 30 Minutes Intravenous Once 05/22/20 0745 05/22/20 1031      Inpatient Medications  Scheduled Meds: . apixaban  5 mg Oral BID  . escitalopram  20 mg Oral Daily  . estradiol  1 mg Oral Daily  . fluticasone furoate-vilanterol  1 puff Inhalation Daily   And  . umeclidinium bromide  1 puff Inhalation Daily  . furosemide  40 mg Intravenous Q12H  . insulin aspart  0-20 Units Subcutaneous TID WC  . insulin aspart  0-5 Units Subcutaneous QHS  .  insulin aspart  4 Units Subcutaneous TID WC  . insulin detemir  10 Units Subcutaneous QHS  . irbesartan  150 mg Oral Daily  . levothyroxine  25 mcg Oral Q0600  . loratadine  10 mg Oral Daily  . methylPREDNISolone (SOLU-MEDROL) injection  40 mg Intravenous BID  . metoprolol tartrate  100 mg Oral BID  .  multivitamin with minerals  1 tablet Oral Daily  . pantoprazole  40 mg Oral Daily  . potassium chloride  20 mEq Oral Daily  . pravastatin  40 mg Oral Daily  . Ensure Max Protein  11 oz Oral BID  . sodium chloride flush  3 mL Intravenous Q12H   Continuous Infusions: . sodium chloride     PRN Meds:.sodium chloride, acetaminophen, cyclobenzaprine, diphenoxylate-atropine, guaiFENesin, LORazepam, ondansetron (ZOFRAN) IV, sodium chloride flush, tetrahydrozoline   Time Spent in minutes  25  See all Orders from today for further details   Oren Binet M.D on 05/24/2020 at 12:35 PM  To page go to [email or web address] - use universal password  [facility] -  Office  [phone]    Objective:   Vitals:   05/23/20 0603 05/23/20 1405 05/23/20 2108 05/24/20 0401  BP:  107/63 108/67 128/84  Pulse: 79 89 82 70  Resp: 18 19 18 15   Temp:  98.3 F (36.8 C) 98.2 F (36.8 C) 97.8 F (36.6 C)  TempSrc:  Oral Oral Oral  SpO2: (!) 87% 95% 100% 93%  Weight:      Height:        Wt Readings from Last 3 Encounters:  05/23/20 124.9 kg  10/31/19 125.6 kg  09/27/19 123.4 kg     Intake/Output Summary (Last 24 hours) at 05/24/2020 1235 Last data filed at 05/24/2020 [id number] Gross per 24 hour  Intake 600 ml  Output 2125 ml  Net -1525 ml     Physical Exam Gen Exam:Alert awake-not in any distress HEENT:atraumatic, normocephalic Chest: B/L clear to auscultation anteriorly CVS:S1S2 regular Abdomen:soft non tender, non distended Extremities:+edema Neurology: Non focal Skin: no rash   Data Review:    CBC Recent Labs  Lab 05/21/20 0928 05/22/20 0208 05/23/20 0122 05/24/20 0107  WBC 8.6 5.5 11.8* 11.3*  HGB 12.6 13.4 13.3 12.9  HCT 39.2 41.2 39.8 39.0  PLT 234 255 300 316  MCV 93.1 92.8 92.3 91.8  MCH 29.9 30.2 30.9 30.4  MCHC 32.1 32.5 33.4 33.1  RDW 13.6 13.5 13.3 13.4  LYMPHSABS 1.1 0.8 0.7 0.8  MONOABS 0.6 0.4 0.3 0.6  EOSABS 0.0 0.0 0.0 0.0  BASOSABS 0.0 0.0 0.0 0.0     Chemistries  Recent Labs  Lab 05/21/20 0928 05/21/20 1700 05/22/20 0208 05/23/20 0122 05/24/20 0107  NA 136 135 136 137 138  K 2.7* 3.0* 3.5 3.3* 3.8  CL 91* 92* 90* 91* 91*  CO2 27 28 31  33* 35*  GLUCOSE 175* 247* 226* 168* 183*  BUN 6* 6* 9 16 20   CREATININE 0.88 0.82 0.78 0.82 0.77  CALCIUM 6.8* 6.6* 6.9* 6.5* 6.9*  MG  --  0.7* 0.8* 1.5* 2.0  AST  --   --  31 32 28  ALT  --   --  20 22 23   ALKPHOS  --   --  71 72 82  BILITOT  --   --  1.3* 0.9 0.6   ------------------------------------------------------------------------------------------------------------------ No results for input(s): CHOL, HDL, LDLCALC, TRIG, CHOLHDL, LDLDIRECT in the last 72 hours.  Lab Results  Component Value Date   HGBA1C  7.3 (H) 05/21/2020   ------------------------------------------------------------------------------------------------------------------ Recent Labs    05/21/20 1700  TSH 0.591   ------------------------------------------------------------------------------------------------------------------ Recent Labs    05/23/20 0122 05/24/20 0107  FERRITIN 201 183    Coagulation profile No results for input(s): INR, PROTIME in the last 168 hours.  Recent Labs    05/23/20 0122 05/24/20 0107  DDIMER 0.31 0.31    Cardiac Enzymes No results for input(s): CKMB, TROPONINI, MYOGLOBIN in the last 168 hours.  Invalid input(s): CK ------------------------------------------------------------------------------------------------------------------    Component Value Date/Time   BNP 152.0 (H) 05/21/2020 3614    Micro Results Recent Results (from the past 240 hour(s))  SARS Coronavirus 2 by RT PCR (hospital order, performed in [facility] hospital lab) Nasopharyngeal Nasopharyngeal Swab     Status: Abnormal   Collection Time: 05/21/20  9:28 AM   Specimen: Nasopharyngeal Swab  Result Value Ref Range Status   SARS Coronavirus 2 POSITIVE (A) NEGATIVE Final    Comment: RESULT  CALLED TO, READ BACK BY AND VERIFIED WITH: Princess Perna [phone] at 1114 by cm (NOTE) SARS-CoV-2 target nucleic acids are DETECTED  SARS-CoV-2 RNA is generally detectable in upper respiratory specimens  during the acute phase of infection.  Positive results are indicative  of the presence of the identified virus, but do not rule out bacterial infection or co-infection with other pathogens not detected by the test.  Clinical correlation with patient history and  other diagnostic information is necessary to determine patient infection status.  The expected result is negative.  Fact Sheet for Patients:   StrictlyIdeas.no   Fact Sheet for Healthcare Providers:   BankingDealers.co.za    This test is not yet approved or cleared by the [place] FDA and  has been authorized for detection and/or diagnosis of SARS-CoV-2 by FDA under an Emergency Use Authorization (EUA).  This EUA will remain in effect (meaning this tes t can be used) for the duration of  the COVID-19 declaration under Section 564(b)(1) of the Act, 21 U.S.C. section 360-bbb-3(b)(1), unless the authorization is terminated or revoked sooner.  Performed at [facility], [place] [address], [place], [place] [address]     Radiology Reports DG Chest [place] 1 View  Result Date: 05/23/2020 CLINICAL DATA:  Shortness of breath.  COVID. EXAM: PORTABLE CHEST 1 VIEW COMPARISON:  05/21/2020. FINDINGS: Mediastinum hilar structures normal. Cardiomegaly. No pulmonary venous congestion. Low lung volumes with mild bibasilar atelectasis. Mild left base infiltrate cannot be excluded. No pleural effusion or pneumothorax. Degenerative change thoracic spine. IMPRESSION: 1. Low lung volumes with mild bibasilar atelectasis. Mild left base infiltrate cannot be excluded. 2.  Stable cardiomegaly.  No pulmonary venous congestion. Electronically Signed   By: Marcello Moores  Register   On: 05/23/2020 06:38   DG  Chest Portable 1 View  Result Date: 05/21/2020 CLINICAL DATA:  Cough and shortness of breath. EXAM: PORTABLE CHEST 1 VIEW COMPARISON:  12/01/2018 FINDINGS: 0944 hours. Cardiopericardial silhouette is at upper limits of normal for size. There is pulmonary vascular congestion without overt pulmonary edema. No overt airspace pulmonary edema or focal lung consolidation. No substantial pleural effusion. Telemetry leads overlie the chest. IMPRESSION: Pulmonary vascular congestion without acute cardiopulmonary findings. Electronically Signed   By: Misty Stanley M.D.   On: 05/21/2020 09:55   ECHOCARDIOGRAM LIMITED  Result Date: 05/22/2020    ECHOCARDIOGRAM LIMITED REPORT   Patient Name:   JANIFER GIESELMAN Date of Exam: 05/22/2020 Medical Rec #:  [id number]     Height:  62.0 in Accession #:    [id number]    Weight:       271.8 lb Date of Birth:  [birth year]     BSA:          2.178 m Patient Age:    72 years      BP:           141/89 mmHg Patient Gender: F             HR:           91 bpm. Exam Location:  Inpatient Procedure: Limited Echo, Cardiac Doppler and Color Doppler Indications:    CHF  History:        Patient has prior history of Echocardiogram examinations, most                 recent 05/24/2019. COPD, Arrythmias:Atrial Fibrillation,                 Signs/Symptoms:Shortness of Breath and LE edema; Risk                 Factors:Hypertension, Dyslipidemia and Obesity.  Sonographer:    Dustin Flock Referring Phys: [id number] [place] Comments: Patient is morbidly obese. Image acquisition challenging due to patient body habitus and Image acquisition challenging due to COPD. COVID+ IMPRESSIONS  1. Left ventricular ejection fraction, by estimation, is 50 to 55%. The left ventricle has low normal function. Left ventricular endocardial border not optimally defined to evaluate regional wall motion. There is mild left ventricular hypertrophy. Left ventricular diastolic parameters are indeterminate.  2. Right  ventricle is not well visualized but grossly normal size and systolic function. The estimated right ventricular systolic pressure is 99991111 mmHg.  3. The mitral valve is normal in structure. No evidence of mitral valve regurgitation.  4. The aortic valve was not well visualized. Aortic valve regurgitation is not visualized.  5. The inferior vena cava is dilated in size with >50% respiratory variability, suggesting right atrial pressure of 8 mmHg. FINDINGS  Left Ventricle: Left ventricular ejection fraction, by estimation, is 50 to 55%. The left ventricle has low normal function. Left ventricular endocardial border not optimally defined to evaluate regional wall motion. The left ventricular internal cavity  size was normal in size. There is mild left ventricular hypertrophy. Left ventricular diastolic parameters are indeterminate. Right Ventricle: The right ventricular size is normal. Right ventricular systolic function is normal. There is normal pulmonary artery systolic pressure. The tricuspid regurgitant velocity is 2.57 m/s, and with an assumed right atrial pressure of 8 mmHg,  the estimated right ventricular systolic pressure is 99991111 mmHg. Pericardium: There is no evidence of pericardial effusion. Mitral Valve: The mitral valve is normal in structure. Tricuspid Valve: The tricuspid valve is normal in structure. Tricuspid valve regurgitation is trivial. Aortic Valve: The aortic valve was not well visualized. Aortic valve regurgitation is not visualized. Pulmonic Valve: The pulmonic valve was not well visualized. Pulmonic valve regurgitation is not visualized. Aorta: The aortic root is normal in size and structure. Venous: The inferior vena cava is dilated in size with greater than 50% respiratory variability, suggesting right atrial pressure of 8 mmHg. IAS/Shunts: The interatrial septum was not well visualized. LEFT VENTRICLE PLAX 2D LVIDd:         4.50 cm  Diastology LVIDs:         3.40 cm  LV e' medial:    8.05  cm/s LV PW:  1.40 cm  LV E/e' medial:  12.2 LV IVS:        1.00 cm  LV e' lateral:   8.27 cm/s LVOT diam:     2.20 cm  LV E/e' lateral: 11.9 LVOT Area:     3.80 cm  RIGHT VENTRICLE RV S prime:     6.64 cm/s LEFT ATRIUM         Index LA diam:    3.70 cm 1.70 cm/m   AORTA Ao Root diam: 2.70 cm MITRAL VALVE               TRICUSPID VALVE MV Area (PHT): 2.73 cm    TR Peak grad:   26.4 mmHg MV Decel Time: 278 msec    TR Vmax:        257.00 cm/s MV E velocity: 98.50 cm/s MV A velocity: 28.10 cm/s  SHUNTS MV E/A ratio:  3.51        Systemic Diam: 2.20 cm Oswaldo Milian MD Electronically signed by Oswaldo Milian MD Signature Date/Time: 05/22/2020/1:25:17 PM    Final

## 2020-05-24 NOTE — Care Management Important Message (Signed)
Important Message  Patient Details  Name: Andrea Perry MRN: [id number] Date of Birth: [birth year]   Medicare Important Message Given:  Yes - Important Message mailed due to current National Emergency  Verbal consent obtained due to current National Emergency  Relationship to patient: Self Contact Name: Azara Call Date: 05/24/20  Time: 1500 Phone: [phone] Outcome: No Answer/Busy Important Message mailed to: Patient address on file    Delorse Lek 05/24/2020, 3:00 PM

## 2020-05-25 DIAGNOSIS — I482 Chronic atrial fibrillation, unspecified: Secondary | ICD-10-CM

## 2020-05-25 LAB — CBC WITH DIFFERENTIAL/PLATELET
Abs Immature Granulocytes: 0.05 K/uL (ref 0.00–0.07)
Basophils Absolute: 0 K/uL (ref 0.0–0.1)
Basophils Relative: 0 %
Eosinophils Absolute: 0 K/uL (ref 0.0–0.5)
Eosinophils Relative: 0 %
HCT: 41.2 % (ref 36.0–46.0)
Hemoglobin: 13.8 g/dL (ref 12.0–15.0)
Immature Granulocytes: 1 %
Lymphocytes Relative: 11 %
Lymphs Abs: 1.1 K/uL (ref 0.7–4.0)
MCH: 30.5 pg (ref 26.0–34.0)
MCHC: 33.5 g/dL (ref 30.0–36.0)
MCV: 91.2 fL (ref 80.0–100.0)
Monocytes Absolute: 0.5 K/uL (ref 0.1–1.0)
Monocytes Relative: 5 %
Neutro Abs: 8.6 K/uL — ABNORMAL HIGH (ref 1.7–7.7)
Neutrophils Relative %: 83 %
Platelets: 359 K/uL (ref 150–400)
RBC: 4.52 MIL/uL (ref 3.87–5.11)
RDW: 13.2 % (ref 11.5–15.5)
WBC: 10.2 K/uL (ref 4.0–10.5)
nRBC: 0 % (ref 0.0–0.2)

## 2020-05-25 LAB — COMPREHENSIVE METABOLIC PANEL
ALT: 24 U/L (ref 0–44)
AST: 26 U/L (ref 15–41)
Albumin: 2.6 g/dL — ABNORMAL LOW (ref 3.5–5.0)
Alkaline Phosphatase: 83 U/L (ref 38–126)
Anion gap: 12 (ref 5–15)
BUN: 22 mg/dL (ref 8–23)
CO2: 33 mmol/L — ABNORMAL HIGH (ref 22–32)
Calcium: 7 mg/dL — ABNORMAL LOW (ref 8.9–10.3)
Chloride: 90 mmol/L — ABNORMAL LOW (ref 98–111)
Creatinine, Ser: 0.94 mg/dL (ref 0.44–1.00)
GFR, Estimated: >60 mL/min (ref >60)
Glucose, Bld: 209 mg/dL — ABNORMAL HIGH (ref 70–99)
Potassium: 4 mmol/L (ref 3.5–5.1)
Sodium: 135 mmol/L (ref 135–145)
Total Bilirubin: 0.6 mg/dL (ref 0.3–1.2)
Total Protein: 6.5 g/dL (ref 6.5–8.1)

## 2020-05-25 LAB — FERRITIN: Ferritin: 188 ng/mL (ref 11–307)

## 2020-05-25 LAB — GLUCOSE, CAPILLARY
Glucose-Capillary: 200 mg/dL — ABNORMAL HIGH (ref 70–99)
Glucose-Capillary: 251 mg/dL — ABNORMAL HIGH (ref 70–99)

## 2020-05-25 LAB — C-REACTIVE PROTEIN: CRP: 1.6 mg/dL — ABNORMAL HIGH

## 2020-05-25 LAB — D-DIMER, QUANTITATIVE: D-Dimer, Quant: 0.34 ug/mL-FEU (ref 0.00–0.50)

## 2020-05-25 LAB — MAGNESIUM: Magnesium: 1.6 mg/dL — ABNORMAL LOW (ref 1.7–2.4)

## 2020-05-25 MED ORDER — PREDNISONE 10 MG PO TABS: ORAL_TABLET | ORAL | 0 refills | Status: DC

## 2020-05-25 MED ORDER — MAGNESIUM SULFATE 4 GM/100ML IV SOLN
4.0000 g | Freq: Once | INTRAVENOUS | Status: AC
  Administered 2020-05-25: 4 g via INTRAVENOUS
  Filled 2020-05-25: qty 100

## 2020-05-25 NOTE — Discharge Summary (Addendum)
PATIENT DETAILS Name: Andrea Perry Age: 70 y.o. Sex: female Date of Birth: 1951/04/15 MRN: [id number]. Admitting Physician: Lequita Halt, MD [id number], Hunt Oris, MD  Admit Date: 05/21/2020 Discharge date: 05/25/2020  Recommendations for Outpatient Follow-up:  1. Follow up with PCP in 1-2 weeks 2. Please obtain CMP/CBC/magnesium in one week 3. Repeat Chest Xray in 4-6 week   Admitted From:  Home  Disposition: Home with home health [place]: Yes  Equipment/Devices: None  Discharge Condition: Stable  CODE STATUS: FULL CODE  Diet recommendation:  Diet Order            Diet - low sodium heart healthy           Diet heart healthy/carb modified Room service appropriate? Yes; Fluid consistency: Thin; Fluid restriction: 2000 mL Fluid  Diet effective now                  Brief Narrative: Patient is a 70 y.o. female with PMHx of PAF, DM-2, COPD, hypothyroidism-presenting with cough/shortness of breath-thought to have decompensated diastolic heart failure, A. fib with RVR and COVID-19 infection.  See below for further details.  COVID-19 vaccinated status: Unvaccinated  Significant Events: 2/1>> Admit to [facility] for A. fib RVR, decompensated heart failure, COVID-19 infection  Significant studies: 2/1>>Chest x-ray: Pulmonary vascular congestion 2/2>> Echo: EF 50-55%  COVID-19 medications: Remdesivir: 2/1>>2/4  Antibiotics: None  Microbiology data: None  Procedures: None  Consults: None  Brief Hospital Course: Acute Hypoxic Resp Failure due to decompensated diastolic heart failure +/- Covid 19 Viral pneumonia: Suspect primary etiology of hypoxemia is  decompensated heart failure-with some component of COVID-19 pneumonia (CRP significantly elevated).  She is more than 6 L negative so far.  Since improved-suspect can be discharged home with tapering steroids.  She continues to have some cough but is otherwise comfortable.  Resume usual dosing  of Lasix.    Ambulatory O2 saturation will be performed by nursing staff before discharge-she is currently on room air with O2 saturations in the 90s-if she requires O2 with ambulation-this will be ordered.  COVID-19 Labs:  Recent Labs    05/23/20 0122 05/24/20 0107 05/25/20 0416  DDIMER 0.31 0.31 0.34  FERRITIN 201 183 188  CRP 7.9* 4.0* 1.6*    Lab Results  Component Value Date   SARSCOV2NAA POSITIVE (A) 05/21/2020   [place] NEGATIVE 05/27/2019   SARSCOV2NAA NOT DETECTED 03/30/2019   SARSCOV2NAA NOT DETECTED 02/06/2019     Diarrhea: Due to COVID-19-resolved-continue as needed Imodium.  Chronic A. fib with RVR: Rate better controlled-on metoprolol-remains on Eliquis.  Per patient-she has failed cardioversion-reviewed outpatient cardiology note-noted plans to continue with rate control.    COPD: Stable-not in flare-continue bronchodilators.  History of mild OSA: Did not tolerate CPAP per last PCCM note  Hypothyroidism: Continue Synthroid-TSH stable.  Hypomagnesemia: Repleted  Hypomagnesemia: Repleted-4 g prior to discharge.  HTN: BP stable-continue metoprolol and Avapro  DM-2: CBG stable-resume Metformin on discharge.  Morbid obesity Estimated body mass index is 50.36 kg/m as calculated from the following:   Height as of this encounter: 5\' 2"  (1.575 m).   Weight as of this encounter: 124.9 kg.    Discharge Diagnoses:  Active Problems:   COVID-19   CHF (congestive heart failure) [facility])   Discharge Instructions:    Person Under Monitoring Name: Andrea Perry [facility]  Location: [place] [address]   Infection Prevention Recommendations for Individuals Confirmed to have, or Being Evaluated for,  2019 Novel Coronavirus (COVID-19) Infection Who Receive Care at Home  Individuals who are confirmed to have, or are being evaluated for, COVID-19 should follow the prevention steps below until a healthcare provider or local or state  health department says they can return to normal activities.  Stay home except to get medical care You should restrict activities outside your home, except for getting medical care. Do not go to work, school, or public areas, and do not use public transportation or taxis.  Call ahead before visiting your doctor Before your medical appointment, call the healthcare provider and tell them that you have, or are being evaluated for, COVID-19 infection. This will help the healthcare provider's office take steps to keep other people from getting infected. Ask your healthcare provider to call the local or state health department.  Monitor your symptoms Seek prompt medical attention if your illness is worsening (e.g., difficulty breathing). Before going to your medical appointment, call the healthcare provider and tell them that you have, or are being evaluated for, COVID-19 infection. Ask your healthcare provider to call the local or state health department.  Wear a facemask You should wear a facemask that covers your nose and mouth when you are in the same room with other people and when you visit a healthcare provider. People who live with or visit you should also wear a facemask while they are in the same room with you.  Separate yourself from other people in your home As much as possible, you should stay in a different room from other people in your home. Also, you should use a separate bathroom, if available.  Avoid sharing household items You should not share dishes, drinking glasses, cups, eating utensils, towels, bedding, or other items with other people in your home. After using these items, you should wash them thoroughly with soap and water.  Cover your coughs and sneezes Cover your mouth and nose with a tissue when you cough or sneeze, or you can cough or sneeze into your sleeve. Throw used tissues in a lined trash can, and immediately wash your hands with soap and water for at  least 20 seconds or use an alcohol-based hand rub.  Wash your [facility] your hands often and thoroughly with soap and water for at least 20 seconds. You can use an alcohol-based hand sanitizer if soap and water are not available and if your hands are not visibly dirty. Avoid touching your eyes, nose, and mouth with unwashed hands.   Prevention Steps for Caregivers and Household Members of Individuals Confirmed to have, or Being Evaluated for, COVID-19 Infection Being Cared for in the Home  If you live with, or provide care at home for, a person confirmed to have, or being evaluated for, COVID-19 infection please follow these guidelines to prevent infection:  Follow healthcare provider's instructions Make sure that you understand and can help the patient follow any healthcare provider instructions for all care.  Provide for the patient's basic needs You should help the patient with basic needs in the home and provide support for getting groceries, prescriptions, and other personal needs.  Monitor the patient's symptoms If they are getting sicker, call his or her medical provider and tell them that the patient has, or is being evaluated for, COVID-19 infection. This will help the healthcare provider's office take steps to keep other people from getting infected. Ask the healthcare provider to call the local or state health department.  Limit the number of people who have contact  with the patient  If possible, have only one caregiver for the patient.  Other household members should stay in another home or place of residence. If this is not possible, they should stay  in another room, or be separated from the patient as much as possible. Use a separate bathroom, if available.  Restrict visitors who do not have an essential need to be in the home.  Keep older adults, very young children, and other sick people away from the patient Keep older adults, very young children, and those  who have compromised immune systems or chronic health conditions away from the patient. This includes people with chronic heart, lung, or kidney conditions, diabetes, and cancer.  Ensure good ventilation Make sure that shared spaces in the home have good air flow, such as from an air conditioner or an opened window, weather permitting.  Wash your hands often  Wash your hands often and thoroughly with soap and water for at least 20 seconds. You can use an alcohol based hand sanitizer if soap and water are not available and if your hands are not visibly dirty.  Avoid touching your eyes, nose, and mouth with unwashed hands.  Use disposable paper towels to dry your hands. If not available, use dedicated cloth towels and replace them when they become wet.  Wear a facemask and gloves  Wear a disposable facemask at all times in the room and gloves when you touch or have contact with the patient's blood, body fluids, and/or secretions or excretions, such as sweat, saliva, sputum, nasal mucus, vomit, urine, or feces.  Ensure the mask fits over your nose and mouth tightly, and do not touch it during use.  Throw out disposable facemasks and gloves after using them. Do not reuse.  Wash your hands immediately after removing your facemask and gloves.  If your personal clothing becomes contaminated, carefully remove clothing and launder. Wash your hands after handling contaminated clothing.  Place all used disposable facemasks, gloves, and other waste in a lined container before disposing them with other household waste.  Remove gloves and wash your hands immediately after handling these items.  Do not share dishes, glasses, or other household items with the patient  Avoid sharing household items. You should not share dishes, drinking glasses, cups, eating utensils, towels, bedding, or other items with a patient who is confirmed to have, or being evaluated for, COVID-19 infection.  After the person  uses these items, you should wash them thoroughly with soap and water.  Wash laundry thoroughly  Immediately remove and wash clothes or bedding that have blood, body fluids, and/or secretions or excretions, such as sweat, saliva, sputum, nasal mucus, vomit, urine, or feces, on them.  Wear gloves when handling laundry from the patient.  Read and follow directions on labels of laundry or clothing items and detergent. In general, wash and dry with the warmest temperatures recommended on the label.  Clean all areas the individual has used often  Clean all touchable surfaces, such as counters, tabletops, doorknobs, bathroom fixtures, toilets, phones, keyboards, tablets, and bedside tables, every day. Also, clean any surfaces that may have blood, body fluids, and/or secretions or excretions on them.  Wear gloves when cleaning surfaces the patient has come in contact with.  Use a diluted bleach solution (e.g., dilute bleach with 1 part bleach and 10 parts water) or a household disinfectant with a label that says EPA-registered for coronaviruses. To make a bleach solution at home, add 1 tablespoon of bleach to  1 quart (4 cups) of water. For a larger supply, add  cup of bleach to 1 gallon (16 cups) of water.  Read labels of cleaning products and follow recommendations provided on product labels. Labels contain instructions for safe and effective use of the cleaning product including precautions you should take when applying the product, such as wearing gloves or eye protection and making sure you have good ventilation during use of the product.  Remove gloves and wash hands immediately after cleaning.  Monitor yourself for signs and symptoms of illness Caregivers and household members are considered close contacts, should monitor their health, and will be asked to limit movement outside of the home to the extent possible. Follow the monitoring steps for close contacts listed on the symptom  monitoring form.   ? If you have additional questions, contact your local health department or call the epidemiologist on call at [phone] (available 24/7). ? This guidance is subject to change. For the most up-to-date guidance from CDC, please refer to their website: YouBlogs.pl    Activity:  As tolerated with Full fall precautions use walker/cane & assistance as needed Discharge Instructions    (HEART FAILURE PATIENTS) Call MD:  Anytime you have any of the following symptoms: 1) 3 pound weight gain in 24 hours or 5 pounds in 1 week 2) shortness of breath, with or without a dry hacking cough 3) swelling in the hands, feet or stomach 4) if you have to sleep on extra pillows at night in order to breathe.   Complete by: As directed    Call MD for:  difficulty breathing, headache or visual disturbances   Complete by: As directed    Diet - low sodium heart healthy   Complete by: As directed    Discharge instructions   Complete by: As directed    1.)  21 days of isolation from 2/1-or from the day of your first symptoms  2.)  If you develop worsening shortness of breath-please seek immediate medical attention  3.)  Please ask your primary care practitioner to repeat a two-view chest x-ray in 4 to 6 weeks  4.)  Please follow-up with your primary care practitioner and primary cardiologist in 2 weeks   Follow with Primary MD  Biagio Borg, MD and your primary cardiologist in in 1-2 weeks  Please get a complete blood count and chemistry panel checked by your Primary MD at your next visit, and again as instructed by your Primary MD.  Get Medicines reviewed and adjusted: Please take all your medications with you for your next visit with your Primary MD  Laboratory/radiological data: Please request your Primary MD to go over all hospital tests and procedure/radiological results at the follow up, please ask your Primary MD to  get all Hospital records sent to his/her office.  In some cases, they will be blood work, cultures and biopsy results pending at the time of your discharge. Please request that your primary care M.D. follows up on these results.  Also Note the following: If you experience worsening of your admission symptoms, develop shortness of breath, life threatening emergency, suicidal or homicidal thoughts you must seek medical attention immediately by calling 911 or calling your MD immediately  if symptoms less severe.  You must read complete instructions/literature along with all the possible adverse reactions/side effects for all the Medicines you take and that have been prescribed to you. Take any new Medicines after you have completely understood and accpet all the possible adverse  reactions/side effects.   Do not drive when taking Pain medications or sleeping medications (Benzodaizepines)  Do not take more than prescribed Pain, Sleep and Anxiety Medications. It is not advisable to combine anxiety,sleep and pain medications without talking with your primary care practitioner  Special Instructions: If you have smoked or chewed Tobacco  in the last 2 yrs please stop smoking, stop any regular Alcohol  and or any Recreational drug use.  Wear Seat belts while driving.  Please note: You were cared for by a hospitalist during your hospital stay. Once you are discharged, your primary care physician will handle any further medical issues. Please note that NO REFILLS for any discharge medications will be authorized once you are discharged, as it is imperative that you return to your primary care physician (or establish a relationship with a primary care physician if you do not have one) for your post hospital discharge needs so that they can reassess your need for medications and monitor your lab values.   Increase activity slowly   Complete by: As directed      Allergies as of 05/25/2020      Reactions    Tramadol Hcl Nausea And Vomiting, Other (See Comments)    mouth dryness, headache   Codeine Nausea And Vomiting, Nausea Only   Tape Rash   Plastic tape, bandaids and ekg leads, causes redness and rash   Vicodin [hydrocodone-acetaminophen] Nausea And Vomiting      Medication List    STOP taking these medications   doxycycline 100 MG tablet Commonly known as: VIBRA-TABS     TAKE these medications   albuterol 108 (90 Base) MCG/ACT inhaler Commonly known as: VENTOLIN HFA Inhale 2 puffs by mouth every 6 hours as needed for wheezing or shortness of breath. What changed:   how much to take  how to take this  when to take this  reasons to take this  additional instructions   albuterol (2.5 MG/3ML) 0.083% nebulizer solution Commonly known as: PROVENTIL Use every 4 hours with Ipratropium What changed:   how much to take  how to take this  when to take this  reasons to take this  additional instructions   Breztri Aerosphere 160-9-4.8 MCG/ACT Aero Generic drug: Budeson-Glycopyrrol-Formoterol Inhale 2 puffs into the lungs in the morning, at noon, and at bedtime.   Breztri Aerosphere 160-9-4.8 MCG/ACT Aero Generic drug: Budeson-Glycopyrrol-Formoterol INHALE 2 PUFFS TWICE DAILY   cetirizine 10 MG tablet Commonly known as: ZYRTEC Take 10 mg by mouth daily.   cyclobenzaprine 5 MG tablet Commonly known as: FLEXERIL Take 1 tablet by mouth three times daily as needed for muscle spasm What changed:   when to take this  reasons to take this  additional instructions   dimenhyDRINATE 50 MG tablet Commonly known as: DRAMAMINE Take 50 mg by mouth daily as needed for nausea.   diphenoxylate-atropine 2.5-0.025 MG tablet Commonly known as: Lomotil Take 1 tablet by mouth 4 (four) times daily as needed for diarrhea or loose stools.   Eliquis 5 MG Tabs tablet Generic drug: apixaban Take 1 tablet by mouth twice daily What changed: how much to take   escitalopram 20 MG  tablet Commonly known as: LEXAPRO Take 1 tablet by mouth once daily What changed: when to take this   estradiol 1 MG tablet Commonly known as: ESTRACE Take 1 mg by mouth daily.   Euthyrox 25 MCG tablet Generic drug: levothyroxine TAKE 1 TABLET BY MOUTH ONCE DAILY BEFORE BREAKFAST. PATIENT NEEDS AN OFFICE  VISIT. What changed: See the new instructions.   Fish Oil 1200 MG Caps Take 1,200 mg by mouth daily.   FreeStyle [employer] Use as directed daily E11.9   furosemide 40 MG tablet Commonly known as: LASIX Take 1 tablet by mouth once daily   glucose blood test strip Commonly known as: FREESTYLE LITE Use as instructed once daily E11.9   guaiFENesin 600 MG 12 hr tablet Commonly known as: MUCINEX Take 1 tablet (600 mg total) by mouth 2 (two) times daily as needed for cough or to loosen phlegm.   ipratropium 0.02 % nebulizer solution Commonly known as: ATROVENT Take 2.5 mLs (0.5 mg total) by nebulization 4 (four) times daily. What changed:   when to take this  reasons to take this   ipratropium-albuterol 0.5-2.5 (3) MG/3ML Soln Commonly known as: DUONEB USE 1 AMPULE IN NEBULIZER 4 TIMES DAILY   irbesartan 150 MG tablet Commonly known as: AVAPRO Take 1 tablet by mouth once daily   Lancets Misc Use as directed once daily E11.9   LORazepam 1 MG tablet Commonly known as: ATIVAN Take 1 tablet by mouth twice daily as needed for anxiety What changed:   when to take this  additional instructions   metFORMIN 500 MG 24 hr tablet Commonly known as: GLUCOPHAGE-XR Take 2 tablets (1,000 mg total) by mouth daily with breakfast. What changed: Another medication with the same name was removed. Continue taking this medication, and follow the directions you see here.   metoprolol tartrate 100 MG tablet Commonly known as: LOPRESSOR Take 1 tablet (100 mg total) by mouth 2 (two) times daily.   pantoprazole 40 MG tablet Commonly known as: PROTONIX Take 1 tablet by mouth once  daily   potassium chloride SA 20 MEQ tablet Commonly known as: KLOR-CON Take 1 tablet (20 mEq total) by mouth daily.   pravastatin 40 MG tablet Commonly known as: PRAVACHOL Take 1 tablet by mouth once daily   predniSONE 10 MG tablet Commonly known as: DELTASONE Take 40 mg daily for 1 day, 30 mg daily for 1 day, 20 mg daily for 1 days,10 mg daily for 1 day, then stop   promethazine-dextromethorphan 6.25-15 MG/5ML syrup Commonly known as: PROMETHAZINE-DM Take 5 mLs by mouth 4 (four) times daily as needed for cough.   Vitamin D 50 MCG (2000 UT) tablet Take 2,000 Units by mouth daily.   vitamin E 180 MG (400 UNITS) capsule Take 400 Units by mouth daily.       Follow-up Information    Biagio Borg, MD. Schedule an appointment as soon as possible for a visit in 2 week(s).   Specialties: Internal Medicine, Radiology Contact information: [place] [address] [phone]        Josue Hector, MD. Schedule an appointment as soon as possible for a visit in 2 week(s).   Specialty: Cardiology Contact information: [id number]  [place] [address] [phone]              Allergies  Allergen Reactions  . Tramadol Hcl Nausea And Vomiting and Other (See Comments)     mouth dryness, headache  . Codeine Nausea And Vomiting and Nausea Only  . Tape Rash    Plastic tape, bandaids and ekg leads, causes redness and rash  . Vicodin [Hydrocodone-Acetaminophen] Nausea And Vomiting     Other Procedures/Studies: DG Chest Port 1 View  Result Date: 05/23/2020 CLINICAL DATA:  Shortness of breath.  COVID. EXAM: PORTABLE CHEST  1 VIEW COMPARISON:  05/21/2020. FINDINGS: Mediastinum hilar structures normal. Cardiomegaly. No pulmonary venous congestion. Low lung volumes with mild bibasilar atelectasis. Mild left base infiltrate cannot be excluded. No pleural effusion or pneumothorax. Degenerative change thoracic spine. IMPRESSION: 1. Low lung  volumes with mild bibasilar atelectasis. Mild left base infiltrate cannot be excluded. 2.  Stable cardiomegaly.  No pulmonary venous congestion. Electronically Signed   By: Marcello Moores  Register   On: 05/23/2020 06:38   DG Chest Portable 1 View  Result Date: 05/21/2020 CLINICAL DATA:  Cough and shortness of breath. EXAM: PORTABLE CHEST 1 VIEW COMPARISON:  12/01/2018 FINDINGS: 0944 hours. Cardiopericardial silhouette is at upper limits of normal for size. There is pulmonary vascular congestion without overt pulmonary edema. No overt airspace pulmonary edema or focal lung consolidation. No substantial pleural effusion. Telemetry leads overlie the chest. IMPRESSION: Pulmonary vascular congestion without acute cardiopulmonary findings. Electronically Signed   By: Misty Stanley M.D.   On: 05/21/2020 09:55   ECHOCARDIOGRAM LIMITED  Result Date: 05/22/2020    ECHOCARDIOGRAM LIMITED REPORT   Patient Name:   Andrea Perry Date of Exam: 05/22/2020 Medical Rec #:  [id number]     Height:       62.0 in Accession #:    [id number]    Weight:       271.8 lb Date of Birth:  06/23/1950     BSA:          2.178 m Patient Age:    62 years      BP:           141/89 mmHg Patient Gender: F             HR:           91 bpm. Exam Location:  Inpatient Procedure: Limited Echo, Cardiac Doppler and Color Doppler Indications:    CHF  History:        Patient has prior history of Echocardiogram examinations, most                 recent 05/24/2019. COPD, Arrythmias:Atrial Fibrillation,                 Signs/Symptoms:Shortness of Breath and LE edema; Risk                 Factors:Hypertension, Dyslipidemia and Obesity.  Sonographer:    Dustin Flock Referring Phys: [id number] [place] Comments: Patient is morbidly obese. Image acquisition challenging due to patient body habitus and Image acquisition challenging due to COPD. COVID+ IMPRESSIONS  1. Left ventricular ejection fraction, by estimation, is 50 to 55%. The left ventricle has low  normal function. Left ventricular endocardial border not optimally defined to evaluate regional wall motion. There is mild left ventricular hypertrophy. Left ventricular diastolic parameters are indeterminate.  2. Right ventricle is not well visualized but grossly normal size and systolic function. The estimated right ventricular systolic pressure is [id number] mmHg.  3. The mitral valve is normal in structure. No evidence of mitral valve regurgitation.  4. The aortic valve was not well visualized. Aortic valve regurgitation is not visualized.  5. The inferior vena cava is dilated in size with >50% respiratory variability, suggesting right atrial pressure of 8 mmHg. FINDINGS  Left Ventricle: Left ventricular ejection fraction, by estimation, is 50 to 55%. The left ventricle has low normal function. Left ventricular endocardial border not optimally defined to evaluate regional wall motion. The left ventricular internal cavity  size was normal in  size. There is mild left ventricular hypertrophy. Left ventricular diastolic parameters are indeterminate. Right Ventricle: The right ventricular size is normal. Right ventricular systolic function is normal. There is normal pulmonary artery systolic pressure. The tricuspid regurgitant velocity is 2.57 m/s, and with an assumed right atrial pressure of 8 mmHg,  the estimated right ventricular systolic pressure is 83.3 mmHg. Pericardium: There is no evidence of pericardial effusion. Mitral Valve: The mitral valve is normal in structure. Tricuspid Valve: The tricuspid valve is normal in structure. Tricuspid valve regurgitation is trivial. Aortic Valve: The aortic valve was not well visualized. Aortic valve regurgitation is not visualized. Pulmonic Valve: The pulmonic valve was not well visualized. Pulmonic valve regurgitation is not visualized. Aorta: The aortic root is normal in size and structure. Venous: The inferior vena cava is dilated in size with greater than 50% respiratory  variability, suggesting right atrial pressure of 8 mmHg. IAS/Shunts: The interatrial septum was not well visualized. LEFT VENTRICLE PLAX 2D LVIDd:         4.50 cm  Diastology LVIDs:         3.40 cm  LV e' medial:    8.05 cm/s LV PW:         1.40 cm  LV E/e' medial:  12.2 LV IVS:        1.00 cm  LV e' lateral:   8.27 cm/s LVOT diam:     2.20 cm  LV E/e' lateral: 11.9 LVOT Area:     3.80 cm  RIGHT VENTRICLE RV S prime:     6.64 cm/s LEFT ATRIUM         Index LA diam:    3.70 cm 1.70 cm/m   AORTA Ao Root diam: 2.70 cm MITRAL VALVE               TRICUSPID VALVE MV Area (PHT): 2.73 cm    TR Peak grad:   26.4 mmHg MV Decel Time: 278 msec    TR Vmax:        257.00 cm/s MV E velocity: 98.50 cm/s MV A velocity: 28.10 cm/s  SHUNTS MV E/A ratio:  3.51        Systemic Diam: 2.20 cm Oswaldo Milian MD Electronically signed by Oswaldo Milian MD Signature Date/Time: 05/22/2020/1:25:17 PM    Final      TODAY-DAY OF DISCHARGE:  Subjective:   Andrea Perry today has no headache,no chest abdominal pain,no new weakness tingling or numbness, feels much better wants to go home today.   Objective:   Blood pressure 116/73, pulse 78, temperature 98.6 F (37 C), temperature source Oral, resp. rate 17, height 5\' 2"  (1.575 m), weight 120 kg, SpO2 95 %.  Intake/Output Summary (Last 24 hours) at 05/25/2020 1018 Last data filed at 05/25/2020 0918 Gross per 24 hour  Intake 480 ml  Output 1750 ml  Net -1270 ml   Filed Weights   05/22/20 0438 05/23/20 0440 05/25/20 0336  Weight: 123.3 kg 124.9 kg 120 kg    Exam: Awake Alert, Oriented *3, No new F.N deficits, Normal affect [place].AT,PERRAL Supple Neck,No JVD, No cervical lymphadenopathy appriciated.  Symmetrical Chest wall movement, Good air movement bilaterally, CTAB RRR,No Gallops,Rubs or new Murmurs, No Parasternal Heave +ve B.Sounds, Abd Soft, Non tender, No organomegaly appriciated, No rebound -guarding or rigidity. No Cyanosis, Clubbing or edema, No new Rash  or bruise   PERTINENT RADIOLOGIC STUDIES: DG Chest Port 1 View  Result Date: 05/23/2020 CLINICAL DATA:  Shortness of breath.  COVID. EXAM:  PORTABLE CHEST 1 VIEW COMPARISON:  05/21/2020. FINDINGS: Mediastinum hilar structures normal. Cardiomegaly. No pulmonary venous congestion. Low lung volumes with mild bibasilar atelectasis. Mild left base infiltrate cannot be excluded. No pleural effusion or pneumothorax. Degenerative change thoracic spine. IMPRESSION: 1. Low lung volumes with mild bibasilar atelectasis. Mild left base infiltrate cannot be excluded. 2.  Stable cardiomegaly.  No pulmonary venous congestion. Electronically Signed   By: Marcello Moores  Register   On: 05/23/2020 06:38   DG Chest Portable 1 View  Result Date: 05/21/2020 CLINICAL DATA:  Cough and shortness of breath. EXAM: PORTABLE CHEST 1 VIEW COMPARISON:  12/01/2018 FINDINGS: 0944 hours. Cardiopericardial silhouette is at upper limits of normal for size. There is pulmonary vascular congestion without overt pulmonary edema. No overt airspace pulmonary edema or focal lung consolidation. No substantial pleural effusion. Telemetry leads overlie the chest. IMPRESSION: Pulmonary vascular congestion without acute cardiopulmonary findings. Electronically Signed   By: Misty Stanley M.D.   On: 05/21/2020 09:55   ECHOCARDIOGRAM LIMITED  Result Date: 05/22/2020    ECHOCARDIOGRAM LIMITED REPORT   Patient Name:   Andrea Perry Date of Exam: 05/22/2020 Medical Rec #:  [id number]     Height:       62.0 in Accession #:    [id number]    Weight:       271.8 lb Date of Birth:  August 17, 1950     BSA:          2.178 m Patient Age:    86 years      BP:           141/89 mmHg Patient Gender: F             HR:           91 bpm. Exam Location:  Inpatient Procedure: Limited Echo, Cardiac Doppler and Color Doppler Indications:    CHF  History:        Patient has prior history of Echocardiogram examinations, most                 recent 05/24/2019. COPD, Arrythmias:Atrial Fibrillation,                  Signs/Symptoms:Shortness of Breath and LE edema; Risk                 Factors:Hypertension, Dyslipidemia and Obesity.  Sonographer:    Dustin Flock Referring Phys: [id number] [place] Comments: Patient is morbidly obese. Image acquisition challenging due to patient body habitus and Image acquisition challenging due to COPD. COVID+ IMPRESSIONS  1. Left ventricular ejection fraction, by estimation, is 50 to 55%. The left ventricle has low normal function. Left ventricular endocardial border not optimally defined to evaluate regional wall motion. There is mild left ventricular hypertrophy. Left ventricular diastolic parameters are indeterminate.  2. Right ventricle is not well visualized but grossly normal size and systolic function. The estimated right ventricular systolic pressure is 99991111 mmHg.  3. The mitral valve is normal in structure. No evidence of mitral valve regurgitation.  4. The aortic valve was not well visualized. Aortic valve regurgitation is not visualized.  5. The inferior vena cava is dilated in size with >50% respiratory variability, suggesting right atrial pressure of 8 mmHg. FINDINGS  Left Ventricle: Left ventricular ejection fraction, by estimation, is 50 to 55%. The left ventricle has low normal function. Left ventricular endocardial border not optimally defined to evaluate regional wall motion. The left ventricular internal cavity  size was normal  in size. There is mild left ventricular hypertrophy. Left ventricular diastolic parameters are indeterminate. Right Ventricle: The right ventricular size is normal. Right ventricular systolic function is normal. There is normal pulmonary artery systolic pressure. The tricuspid regurgitant velocity is 2.57 m/s, and with an assumed right atrial pressure of 8 mmHg,  the estimated right ventricular systolic pressure is 99991111 mmHg. Pericardium: There is no evidence of pericardial effusion. Mitral Valve: The mitral valve is  normal in structure. Tricuspid Valve: The tricuspid valve is normal in structure. Tricuspid valve regurgitation is trivial. Aortic Valve: The aortic valve was not well visualized. Aortic valve regurgitation is not visualized. Pulmonic Valve: The pulmonic valve was not well visualized. Pulmonic valve regurgitation is not visualized. Aorta: The aortic root is normal in size and structure. Venous: The inferior vena cava is dilated in size with greater than 50% respiratory variability, suggesting right atrial pressure of 8 mmHg. IAS/Shunts: The interatrial septum was not well visualized. LEFT VENTRICLE PLAX 2D LVIDd:         4.50 cm  Diastology LVIDs:         3.40 cm  LV e' medial:    8.05 cm/s LV PW:         1.40 cm  LV E/e' medial:  12.2 LV IVS:        1.00 cm  LV e' lateral:   8.27 cm/s LVOT diam:     2.20 cm  LV E/e' lateral: 11.9 LVOT Area:     3.80 cm  RIGHT VENTRICLE RV S prime:     6.64 cm/s LEFT ATRIUM         Index LA diam:    3.70 cm 1.70 cm/m   AORTA Ao Root diam: 2.70 cm MITRAL VALVE               TRICUSPID VALVE MV Area (PHT): 2.73 cm    TR Peak grad:   26.4 mmHg MV Decel Time: 278 msec    TR Vmax:        257.00 cm/s MV E velocity: 98.50 cm/s MV A velocity: 28.10 cm/s  SHUNTS MV E/A ratio:  3.51        Systemic Diam: 2.20 cm Oswaldo Milian MD Electronically signed by Oswaldo Milian MD Signature Date/Time: 05/22/2020/1:25:17 PM    Final      PERTINENT LAB RESULTS: CBC: Recent Labs    05/24/20 0107 05/25/20 0416  WBC 11.3* 10.2  HGB 12.9 13.8  HCT 39.0 41.2  PLT 316 359   CMET CMP     Component Value Date/Time   NA 135 05/25/2020 0416   NA 143 09/08/2016 1427   K 4.0 05/25/2020 0416   CL 90 (L) 05/25/2020 0416   CO2 33 (H) 05/25/2020 0416   GLUCOSE 209 (H) 05/25/2020 0416   BUN 22 05/25/2020 0416   BUN 11 09/08/2016 1427   CREATININE 0.94 05/25/2020 0416   CALCIUM 7.0 (L) 05/25/2020 0416   PROT 6.5 05/25/2020 0416   ALBUMIN 2.6 (L) 05/25/2020 0416   AST 26  05/25/2020 0416   ALT 24 05/25/2020 0416   ALKPHOS 83 05/25/2020 0416   BILITOT 0.6 05/25/2020 0416   GFRNONAA >60 05/25/2020 0416   GFRAA >60 03/28/2019 0912    GFR Estimated Creatinine Clearance: 71.6 mL/min (by C-G formula based on SCr of 0.94 mg/dL). No results for input(s): LIPASE, AMYLASE in the last 72 hours. No results for input(s): CKTOTAL, CKMB, CKMBINDEX, TROPONINI in the last 72 hours. Invalid input(s): POCBNP  Recent Labs    05/24/20 0107 05/25/20 0416  DDIMER 0.31 0.34   No results for input(s): HGBA1C in the last 72 hours. No results for input(s): CHOL, HDL, LDLCALC, TRIG, CHOLHDL, LDLDIRECT in the last 72 hours. No results for input(s): TSH, T4TOTAL, T3FREE, THYROIDAB in the last 72 hours.  Invalid input(s): FREET3 Recent Labs    05/24/20 0107 05/25/20 0416  FERRITIN 183 188   Coags: No results for input(s): INR in the last 72 hours.  Invalid input(s): PT Microbiology: Recent Results (from the past 240 hour(s))  SARS Coronavirus 2 by RT PCR (hospital order, performed in [facility] hospital lab) Nasopharyngeal Nasopharyngeal Swab     Status: Abnormal   Collection Time: 05/21/20  9:28 AM   Specimen: Nasopharyngeal Swab  Result Value Ref Range Status   SARS Coronavirus 2 POSITIVE (A) NEGATIVE Final    Comment: RESULT CALLED TO, READ BACK BY AND VERIFIED WITH: Princess Perna [phone] at 1114 by cm (NOTE) SARS-CoV-2 target nucleic acids are DETECTED  SARS-CoV-2 RNA is generally detectable in upper respiratory specimens  during the acute phase of infection.  Positive results are indicative  of the presence of the identified virus, but do not rule out bacterial infection or co-infection with other pathogens not detected by the test.  Clinical correlation with patient history and  other diagnostic information is necessary to determine patient infection status.  The expected result is negative.  Fact Sheet for Patients:    StrictlyIdeas.no   Fact Sheet for Healthcare Providers:   BankingDealers.co.za    This test is not yet approved or cleared by the [place] FDA and  has been authorized for detection and/or diagnosis of SARS-CoV-2 by FDA under an Emergency Use Authorization (EUA).  This EUA will remain in effect (meaning this tes t can be used) for the duration of  the COVID-19 declaration under Section 564(b)(1) of the Act, 21 U.S.C. section 360-bbb-3(b)(1), unless the authorization is terminated or revoked sooner.  Performed at [facility], [place] [address], [place], [place] [address]     FURTHER DISCHARGE INSTRUCTIONS:  Get Medicines reviewed and adjusted: Please take all your medications with you for your next visit with your Primary MD  Laboratory/radiological data: Please request your Primary MD to go over all hospital tests and procedure/radiological results at the follow up, please ask your Primary MD to get all Hospital records sent to his/her office.  In some cases, they will be blood work, cultures and biopsy results pending at the time of your discharge. Please request that your primary care M.D. goes through all the records of your hospital data and follows up on these results.  Also Note the following: If you experience worsening of your admission symptoms, develop shortness of breath, life threatening emergency, suicidal or homicidal thoughts you must seek medical attention immediately by calling 911 or calling your MD immediately  if symptoms less severe.  You must read complete instructions/literature along with all the possible adverse reactions/side effects for all the Medicines you take and that have been prescribed to you. Take any new Medicines after you have completely understood and accpet all the possible adverse reactions/side effects.   Do not drive when taking Pain medications or sleeping medications  (Benzodaizepines)  Do not take more than prescribed Pain, Sleep and Anxiety Medications. It is not advisable to combine anxiety,sleep and pain medications without talking with your primary care practitioner  Special Instructions: If you have smoked or chewed Tobacco  in the last 2 yrs please stop smoking, stop any regular Alcohol  and or any Recreational drug use.  Wear Seat belts while driving.  Please note: You were cared for by a hospitalist during your hospital stay. Once you are discharged, your primary care physician will handle any further medical issues. Please note that NO REFILLS for any discharge medications will be authorized once you are discharged, as it is imperative that you return to your primary care physician (or establish a relationship with a primary care physician if you do not have one) for your post hospital discharge needs so that they can reassess your need for medications and monitor your lab values.  Total Time spent coordinating discharge including counseling, education and face to face time equals 35 minutes.  SignedOren Binet 05/25/2020 10:18 AM

## 2020-05-25 NOTE — Progress Notes (Signed)
SATURATION QUALIFICATIONS: (This note is used to comply with regulatory documentation for home oxygen)  Patient Saturations on Room Air at Rest = 97%  Patient Saturations on Room Air while Ambulating = 92%  Patient Saturations on N/A Liters of oxygen while Ambulating = N/A%  Please briefly explain why patient needs home oxygen: Pt does not need home oxygen. Pt with erratic pleth. When pleth good SpO2 >91%.  [place] Pager [phone] Office [phone]

## 2020-05-25 NOTE — Progress Notes (Signed)
Patient discharging home. Vital signs stable at time of discharge as reflected in discharge summary. Discharge instructions given and verbal understanding returned. Patient discharging with living with heart failure pamphlet.

## 2020-05-25 NOTE — Progress Notes (Signed)
Occupational Therapy Treatment Patient Details Name: Andrea Perry MRN: [id number] DOB: 04-04-1951 Today's Date: 05/25/2020    History of present illness Pt is a 70 y.o. female with medical history significant of paroxysmal A. fib status post cardioversion x2 on Eliquis, COPD/asthma, hypothyroidism, IIDM, morbid obesity, presented with cough, increasing shortness of breath.  Pt admitted with COVID 19 (no signs of PNE), chronic afib with RVR, COPD exacerbation, and acute on chronic CHF.   OT comments  Pt. Seen for skilled OT treatment.  Able to complete LB dressing and in room ambulation for toileting task.  Initiating rest breaks and breathing tech. Appropriately and without cues.  Eager for d/c home later today. Reports family available 24/7 for any needs once home.   Follow Up Recommendations  Home health OT;Supervision/Assistance - 24 hour    Equipment Recommendations  None recommended by OT    Recommendations for Other Services      Precautions / Restrictions Precautions Precautions: None       Mobility Bed Mobility               General bed mobility comments: seated in recliner  Transfers Overall transfer level: Needs assistance Equipment used: Rolling walker (2 wheeled) Transfers: Sit to/from Stand;Stand Pivot Transfers Sit to Stand: Min guard Stand pivot transfers: Min guard       General transfer comment: no lob noted, good pace and rw management    Balance                                           ADL either performed or assessed with clinical judgement   ADL Overall ADL's : Needs assistance/impaired                     Lower Body Dressing: Min guard;Sitting/lateral leans Lower Body Dressing Details (indicate cue type and reason): able to reach feet to adjust socks, states when donning undergarments and pants she places over one foot while seated than over to the other foot before pulling up Toilet Transfer: Min  guard;Ambulation;RW;BSC;Regular Glass blower/designer Details (indicate cue type and reason): amb. in room from recliner to b.room for toileting Toileting- Clothing Manipulation and Hygiene: Supervision/safety;Sit to/from stand       Functional mobility during ADLs: Min guard General ADL Comments: moving well. able to tolerate in room ambulation for toileting directly following PT. initiated rest breaks and breathing tech. appropriately and without cues     Vision       Perception     Praxis      Cognition Arousal/Alertness: Awake/alert Behavior During Therapy: WFL for tasks assessed/performed Overall Cognitive Status: Within Functional Limits for tasks assessed                                          Exercises     Shoulder Instructions       General Comments      Pertinent Vitals/ Pain       Pain Assessment: No/denies pain  Home Living                                          Prior Functioning/Environment  Frequency  Min 2X/week        Progress Toward Goals  OT Goals(current goals can now be found in the care plan section)  Progress towards OT goals: Progressing toward goals     Plan      Co-evaluation                 AM-PAC OT "6 Clicks" Daily Activity     Outcome Measure   Help from another person eating meals?: None Help from another person taking care of personal grooming?: A Little Help from another person toileting, which includes using toliet, bedpan, or urinal?: A Lot Help from another person bathing (including washing, rinsing, drying)?: A Lot Help from another person to put on and taking off regular upper body clothing?: A Little Help from another person to put on and taking off regular lower body clothing?: A Lot 6 Click Score: 16    End of Session Equipment Utilized During Treatment: Gait belt  OT Visit Diagnosis: Unsteadiness on feet (R26.81);Pain   Activity Tolerance  Patient tolerated treatment well   Patient Left in chair;with call bell/phone within reach   Nurse Communication          Time: 6160-7371 OT Time Calculation (min): 17 min  Charges: OT General Charges $OT Visit: 1 Visit OT Treatments $Self Care/Home Management : 8-22 mins  Sonia Baller, COTA/L Acute Rehabilitation [phone]   Janice Coffin 05/25/2020, 1:18 PM

## 2020-05-25 NOTE — Progress Notes (Signed)
Physical Therapy Treatment Patient Details Name: Andrea Perry MRN: [id number] DOB: 07-12-1950 Today's Date: 05/25/2020    History of Present Illness Pt is a 70 y.o. female with medical history significant of paroxysmal A. fib status post cardioversion x2 on Eliquis, COPD/asthma, hypothyroidism, IIDM, morbid obesity, presented with cough, increasing shortness of breath.  Pt admitted with COVID 19 (no signs of PNE), chronic afib with RVR, COPD exacerbation, and acute on chronic CHF.    PT Comments    Pt making good progress with mobility and eager to go home. Pt able to mobilize without supplemental O2.    Follow Up Recommendations  Home health PT     Equipment Recommendations  Rolling walker with 5" wheels    Recommendations for Other Services       Precautions / Restrictions Precautions Precautions: None    Mobility  Bed Mobility               General bed mobility comments: Pt up in chair  Transfers Overall transfer level: Needs assistance Equipment used: None;Rolling walker (2 wheeled) Transfers: Sit to/from Stand;Stand Pivot Transfers Sit to Stand: Supervision Stand pivot transfers: Supervision       General transfer comment: supervision for lines  Ambulation/Gait Ambulation/Gait assistance: Supervision Gait Distance (Feet): 80 Feet Assistive device: None Gait Pattern/deviations: Wide base of support;Step-through pattern;Decreased stride length Gait velocity: decreased Gait velocity interpretation: <1.31 ft/sec, indicative of household ambulator General Gait Details: Steady gait with walker. Supervision for lines.   Stairs             Wheelchair Mobility    Modified Rankin (Stroke Patients Only)       Balance Overall balance assessment: Needs assistance Sitting-balance support: No upper extremity supported;Feet supported Sitting balance-Leahy Scale: Good     Standing balance support: No upper extremity supported Standing balance-Leahy  Scale: Good                              Cognition Arousal/Alertness: Awake/alert Behavior During Therapy: WFL for tasks assessed/performed Overall Cognitive Status: Within Functional Limits for tasks assessed                                        Exercises      General Comments General comments (skin integrity, edema, etc.): SpO2 92% on RA with amb when pleth good      Pertinent Vitals/Pain Pain Assessment: No/denies pain    Home Living                      Prior Function            PT Goals (current goals can now be found in the care plan section) Progress towards PT goals: Progressing toward goals    Frequency    Min 3X/week      PT Plan Current plan remains appropriate    Co-evaluation              AM-PAC PT "6 Clicks" Mobility   Outcome Measure  Help needed turning from your back to your side while in a flat bed without using bedrails?: None Help needed moving from lying on your back to sitting on the side of a flat bed without using bedrails?: A Little Help needed moving to and from a bed to a chair (including a wheelchair)?:  None Help needed standing up from a chair using your arms (e.g., wheelchair or bedside chair)?: None Help needed to walk in hospital room?: None Help needed climbing 3-5 steps with a railing? : A Little 6 Click Score: 22    End of Session   Activity Tolerance: Patient tolerated treatment well Patient left: in chair;with call bell/phone within reach Nurse Communication: Mobility status PT Visit Diagnosis: Other abnormalities of gait and mobility (R26.89)     Time: 2355-7322 PT Time Calculation (min) (ACUTE ONLY): 22 min  Charges:  $Gait Training: 8-22 mins                     [place] Pager [phone] Office [place] 05/25/2020, 1:47 PM

## 2020-05-25 NOTE — TOC Transition Note (Signed)
Transition of Care [facility]) - CM/SW Discharge Note   Patient Details  Name: Andrea Perry MRN: [id number] Date of Birth: 1951/03/09  Transition of Care [facility] CM/SW Contact:  Carles Collet, RN Phone Number: 05/25/2020, 12:54 PM   Clinical Narrative:    Patient does not qualify for home oxygen. Home health arranged through [facility]. Patient has family to provide transportation home. No other CM needs identified    Final next level of care: Home w Home Health Services Barriers to Discharge: Barriers Resolved   Patient Goals and CMS Choice Patient states their goals for this hospitalization and ongoing recovery are:: to go home CMS Medicare.gov Compare Post Acute Care list provided to:: Patient Choice offered to / list presented to : Patient  Discharge Placement                       Discharge Plan and Services                          HH Arranged: PT,OT [place]: [place] ([place]) Date [place]: 05/25/20 Time [place]: [place] Representative spoke with at [place]: [place] ([place]) Interventions     Readmission Risk Interventions No flowsheet data found.

## 2020-05-27 ENCOUNTER — Telehealth: Payer: Self-pay | Admitting: Internal Medicine

## 2020-05-27 ENCOUNTER — Telehealth: Payer: Self-pay | Admitting: Emergency Medicine

## 2020-05-27 MED ORDER — POTASSIUM CHLORIDE CRYS ER 20 MEQ PO TBCR: 20.0000 meq | EXTENDED_RELEASE_TABLET | Freq: Every day | ORAL | 3 refills | Status: DC

## 2020-05-27 NOTE — Telephone Encounter (Signed)
1.Medication Requested: potassium chloride SA (K-DUR,KLOR-CON) 20 MEQ tablet 2. Pharmacy (Name, Street, [place]): [place] - [address] Phone:  [phone]  Fax:  [phone]      3. On Med List: yes 4. Last Visit with PCP: 06.09.21 5. Next visit date with PCP: 02.22.22  Agent: Please be advised that RX refills may take up to 3 business days. We ask that you follow-up with your pharmacy.

## 2020-05-27 NOTE — Telephone Encounter (Signed)
Andrea Perry w/ [facility] called and said that she faxed paper work over the weekend with the patients start of care being changed to 05/31/20.

## 2020-05-28 IMAGING — CR DG SHOULDER 2+V*L*
3 series · 3 of 3 positions shown · non-contrast
Comparison: None.

CLINICAL DATA: Left-sided shoulder pain for 2 days, no known injury

EXAM:
LEFT SHOULDER - 2+ VIEW

[w shoulder ap internal left]
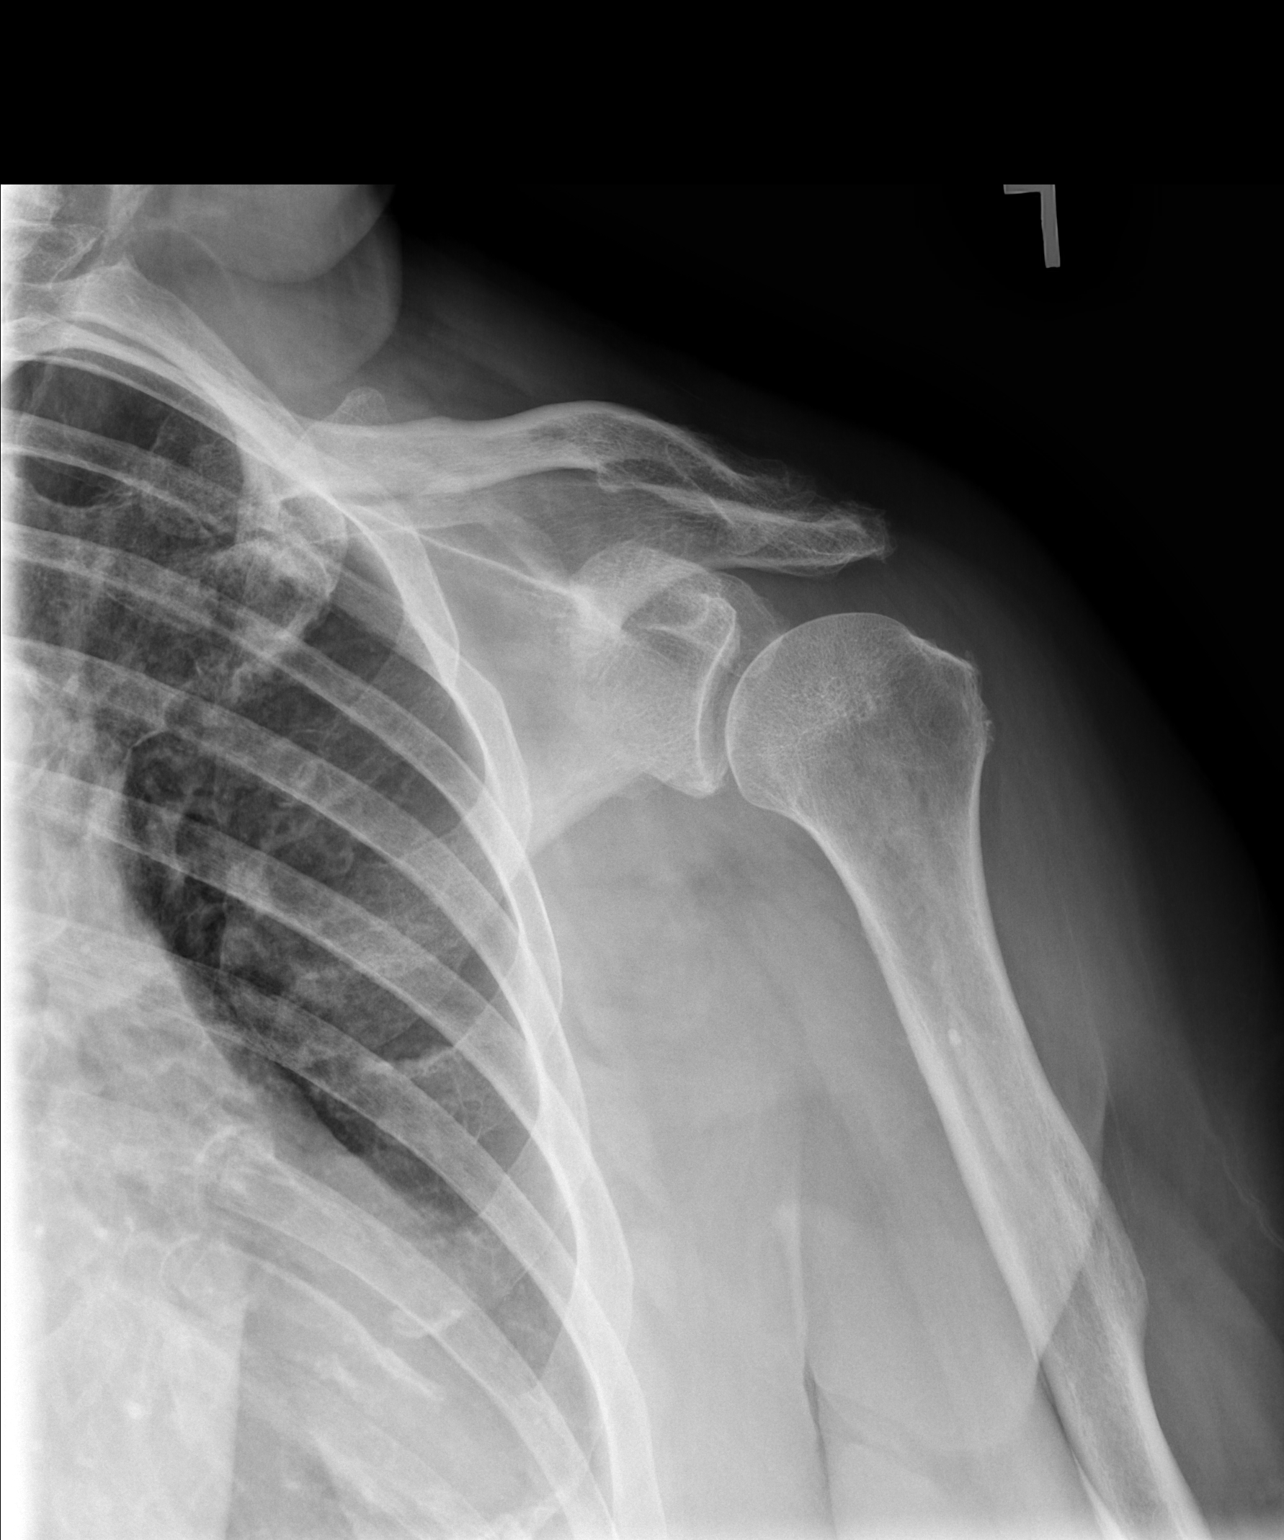

[w shoulder y view left]
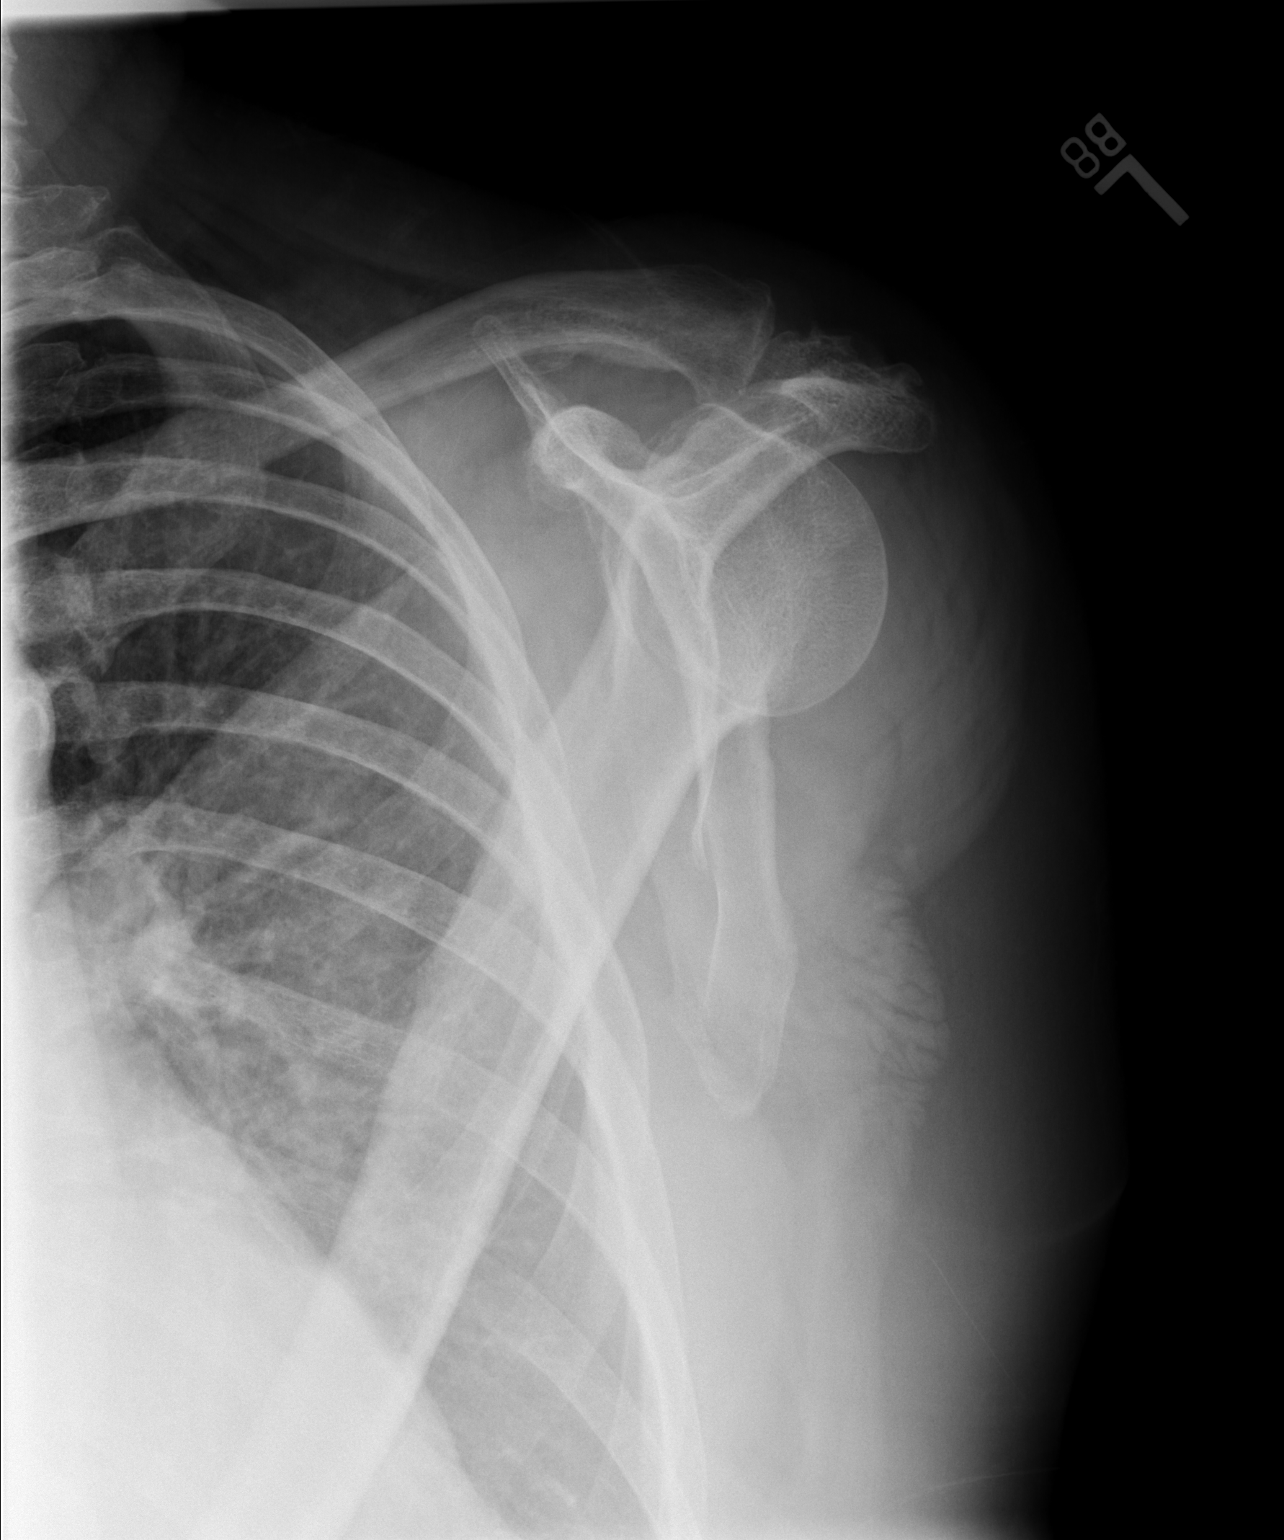

[w shoulder axillary left *]
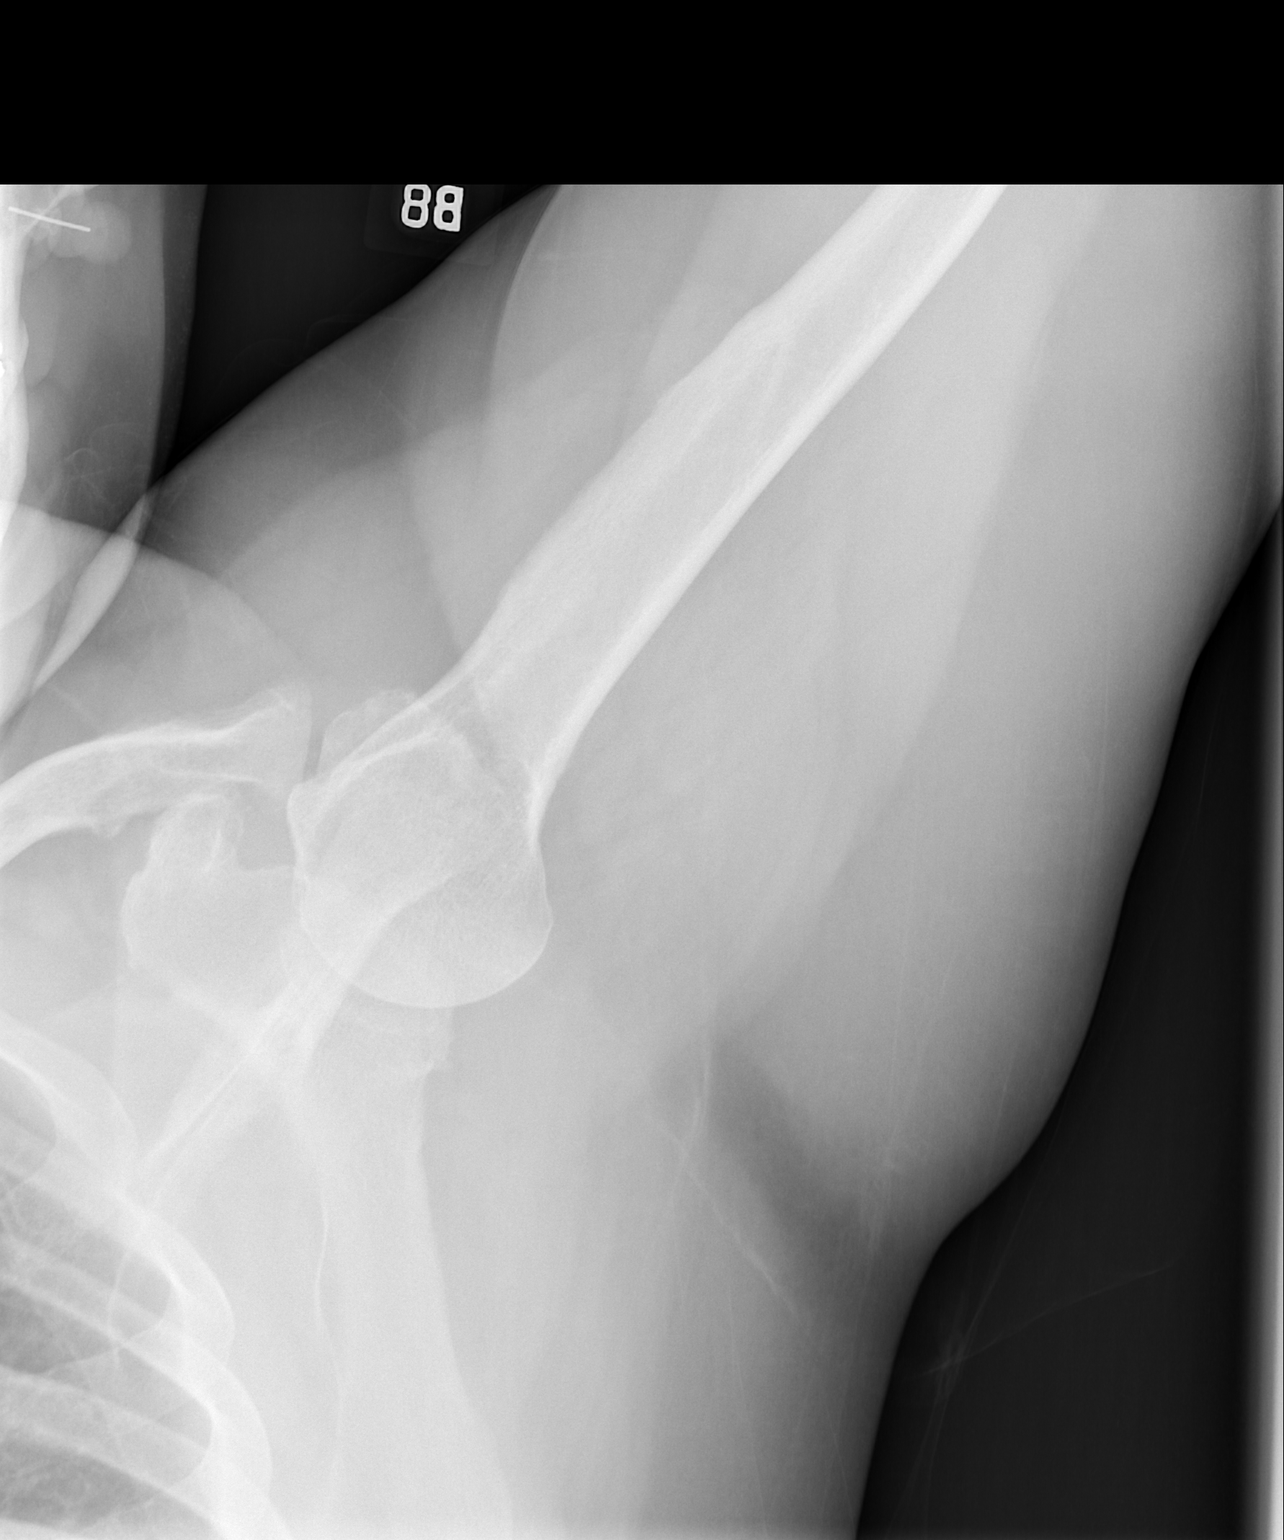

[3 of 3 positions shown; findings below may reference images not displayed]

FINDINGS: Degenerative changes of the acromioclavicular joint are noted. The
humeral head is well seated. No acute fracture or dislocation is
noted. No soft tissue changes are seen.
IMPRESSION: Degenerative change without acute abnormality.

## 2020-05-28 NOTE — Telephone Encounter (Signed)
Called and spoke with pt and she stated that she just got out of the hospital and they told her that she will need oxygen to use when she is up moving around.  She stated that they did not send her home with any oxygen but told her to call our office to get it set up.  She has pending appt with TP on 2/14

## 2020-05-31 ENCOUNTER — Telehealth: Payer: Self-pay | Admitting: Internal Medicine

## 2020-05-31 NOTE — Telephone Encounter (Signed)
° °  [facility] calling, states the patient is declining home health physical therapy and occupational therapy. Told them to reach out to Dr. Jenny Reichmann if she decided she wanted it.

## 2020-06-02 ENCOUNTER — Other Ambulatory Visit: Payer: Self-pay | Admitting: Internal Medicine

## 2020-06-02 NOTE — Telephone Encounter (Signed)
Please refill as per office routine med refill policy (all routine meds refilled for 3 mo or monthly per pt preference up to one year from last visit, then month to month grace period for 3 mo, then further med refills will have to be denied)  

## 2020-06-03 ENCOUNTER — Ambulatory Visit (INDEPENDENT_AMBULATORY_CARE_PROVIDER_SITE_OTHER): Payer: Medicare Other

## 2020-06-03 ENCOUNTER — Other Ambulatory Visit: Payer: Self-pay

## 2020-06-03 ENCOUNTER — Ambulatory Visit: Payer: Medicare Other | Admitting: Adult Health

## 2020-06-03 ENCOUNTER — Encounter: Payer: Self-pay | Admitting: Adult Health

## 2020-06-03 VITALS — BP 90/70 | HR 101 | Temp 97.1°F | Ht 62.0 in | Wt 261.2 lb

## 2020-06-03 DIAGNOSIS — J438 Other emphysema: Secondary | ICD-10-CM

## 2020-06-03 DIAGNOSIS — I4891 Unspecified atrial fibrillation: Secondary | ICD-10-CM

## 2020-06-03 DIAGNOSIS — E119 Type 2 diabetes mellitus without complications: Secondary | ICD-10-CM | POA: Diagnosis not present

## 2020-06-03 DIAGNOSIS — J4540 Moderate persistent asthma, uncomplicated: Secondary | ICD-10-CM

## 2020-06-03 DIAGNOSIS — J449 Chronic obstructive pulmonary disease, unspecified: Secondary | ICD-10-CM | POA: Diagnosis not present

## 2020-06-03 DIAGNOSIS — R5381 Other malaise: Secondary | ICD-10-CM

## 2020-06-03 DIAGNOSIS — I5030 Unspecified diastolic (congestive) heart failure: Secondary | ICD-10-CM | POA: Diagnosis not present

## 2020-06-03 NOTE — Assessment & Plan Note (Signed)
COPD with asthma.  Patient has significant reversibility on previous PFTs.  Patient is continue on her current regimen.  Appears clinically improved from recent exacerbation

## 2020-06-03 NOTE — Addendum Note (Signed)
Addended by: Vanessa Barbara on: 06/03/2020 12:46 PM   Modules accepted: Orders

## 2020-06-03 NOTE — Assessment & Plan Note (Signed)
Recent A. fib with RVR most likely due to COVID-19 infection.  Patient is improved.  She is continue follow-up with cardiology.  Currently on Eliquis.

## 2020-06-03 NOTE — Progress Notes (Signed)
@Patient  ID: Andrea Perry, female    DOB: Dec 26, 1950, 70 y.o.   MRN: [id number]  Chief Complaint  Patient presents with  . Hospitalization Follow-up    Referring provider: Biagio Borg, MD  HPI: 70 year old female former smoker followed for COPD with asthma Patient has obstructive sleep apnea CPAP intolerant.  TEST/EVENTS :  PFT October 2020 showed severe COPD with an FEV1 of 50%, ratio 58, FVC 66%, significant bronchodilator response (61% change), DLCO 72%  06/03/2020 Follow up : COPD , COVID 19  Patient returns for a post hospital follow-up. Last seen 10/2019 .   Patient was admitted February 1 for COVID-19 infection, A. fib with RVR and decompensated heart failure.  Prior to admission patient had 2 weeks of progressive shortness of breath and decreased activity tolerance, orthopnea.  She also had an associated cough.  She was unvaccinated for COVID-19.  Patient was found to be positive for COVID-19.  She required aggressive treatment with IV steroids nebulized bronchodilators and oxygen.  She did require remdesivir.  she also had decompensated congestive heart failure.  She required control of her A. fib.  She was treated with IV diuresis. 2D echo EF 50 to 55%.  She was weaned off of oxygen prior to discharge. Since discharge patient is feeling much better.  Patient remains on BREZTRI twice daily. Uses Albuterol Neb Three times a day  .  Has chronic rhinitis on zyrtec daily.  Says leg swelling is much better. Weight is down 15lbs since last year.  We discussed  Has low activity tolerance. Gets winded with activity . Has family to help her . She does not drive.  Can only do light chores . Son and family live with her.    Allergies  Allergen Reactions  . Tramadol Hcl Nausea And Vomiting and Other (See Comments)     mouth dryness, headache  . Codeine Nausea And Vomiting and Nausea Only  . Tape Rash    Plastic tape, bandaids and ekg leads, causes redness and rash  . Vicodin  [Hydrocodone-Acetaminophen] Nausea And Vomiting    Immunization History  Administered Date(s) Administered  . Fluad Quad(high Dose 65+) 02/09/2019  . Influenza, High Dose Seasonal PF 03/23/2018  . Influenza,inj,Quad PF,6+ Mos 03/01/2013, 02/14/2014, 03/08/2015, 04/16/2016, 01/12/2017  . Influenza-Unspecified 01/18/2017  . Pneumococcal Conjugate-13 03/30/2013  . Pneumococcal Polysaccharide-23 04/04/2014  . Tdap 09/24/2010    Past Medical History:  Diagnosis Date  . ALLERGIC RHINITIS 08/16/2008  . ANXIETY 08/16/2008  . Arthritis    hands, knees, lower back  . ASTHMA 08/16/2008  . Asthma    BRONCHITIS     DR. Lamonte Sakai    . Bladder leak   . Cancer ([place])    MELANOMA      . Chronic lower back pain    problems with disc L2-5  . COPD 08/16/2008  . DEPRESSION 08/16/2008  . Diabetes mellitus without complication ([place])   . Dysrhythmia    hx AF  . Excessive daytime sleepiness 06/19/2015  . GENITAL HERPES 12/03/2006  . GERD (gastroesophageal reflux disease)   . H/O hiatal hernia   . HEMATOCHEZIA 06/20/2009  . Hemorrhoids   . History of cardioversion 10/30/14  . History of kidney stones   . HYPERLIPIDEMIA 08/16/2008  . HYPERTENSION 12/03/2006  . Hypertension   . Impaired glucose tolerance 09/21/2010  . Overactive bladder 10/20/2015  . Pneumonia    as a child  . Pulmonary HTN ([place]) 06/19/2015  . Shortness of breath dyspnea   .  Sleep apnea    MILD NO CPAP ORDERED  . Snoring 06/19/2015    Tobacco History: Social History   Tobacco Use  Smoking Status Former Smoker  . Packs/day: 1.50  . Years: 30.00  . Pack years: 45.00  . Types: Cigarettes  . Quit date: 04/21/2003  . Years since quitting: 17.1  Smokeless Tobacco Never Used   Counseling given: Not Answered   Outpatient Medications Prior to Visit  Medication Sig Dispense Refill  . albuterol (PROVENTIL) (2.5 MG/3ML) 0.083% nebulizer solution Use every 4 hours with Ipratropium (Patient taking differently: Take 2.5 mg by nebulization every 4  (four) hours as needed for wheezing or shortness of breath.) 360 mL 12  . albuterol (VENTOLIN HFA) 108 (90 Base) MCG/ACT inhaler Inhale 2 puffs by mouth every 6 hours as needed for wheezing or shortness of breath. (Patient taking differently: Inhale 2 puffs into the lungs every 6 (six) hours as needed for wheezing or shortness of breath.) 18 g 5  . Blood Glucose Monitoring Suppl (FREESTYLE LITE) DEVI Use as directed daily E11.9 1 each 0  . BREZTRI AEROSPHERE 160-9-4.8 MCG/ACT AERO INHALE 2 PUFFS TWICE DAILY 11 g 1  . Budeson-Glycopyrrol-Formoterol (BREZTRI AEROSPHERE) 160-9-4.8 MCG/ACT AERO Inhale 2 puffs into the lungs in the morning, at noon, and at bedtime.    . cetirizine (ZYRTEC) 10 MG tablet Take 10 mg by mouth daily.    . diphenoxylate-atropine (LOMOTIL) 2.5-0.025 MG tablet Take 1 tablet by mouth 4 (four) times daily as needed for diarrhea or loose stools. 40 tablet 0  . ELIQUIS 5 MG TABS tablet Take 1 tablet by mouth twice daily (Patient taking differently: Take 5 mg by mouth 2 (two) times daily.) 60 tablet 5  . escitalopram (LEXAPRO) 20 MG tablet Take 1 tablet by mouth once daily (Patient taking differently: Take 20 mg by mouth at bedtime.) 90 tablet 1  . estradiol (ESTRACE) 1 MG tablet Take 1 mg by mouth daily.    Arna Medici 25 MCG tablet TAKE 1 TABLET BY MOUTH ONCE DAILY BEFORE BREAKFAST. PATIENT NEEDS AN OFFICE VISIT. 30 tablet 0  . furosemide (LASIX) 40 MG tablet Take 1 tablet by mouth once daily (Patient taking differently: Take 40 mg by mouth daily.) 90 tablet 0  . glucose blood (FREESTYLE LITE) test strip Use as instructed once daily E11.9 100 each 12  . ipratropium (ATROVENT) 0.02 % nebulizer solution Take 2.5 mLs (0.5 mg total) by nebulization 4 (four) times daily. (Patient taking differently: Take 0.5 mg by nebulization every 4 (four) hours as needed for wheezing or shortness of breath.) 300 mL 12  . irbesartan (AVAPRO) 150 MG tablet Take 1 tablet by mouth once daily (Patient taking  differently: Take 150 mg by mouth daily.) 90 tablet 1  . Lancets MISC Use as directed once daily E11.9 100 each 11  . LORazepam (ATIVAN) 1 MG tablet Take 1 tablet by mouth twice daily as needed for anxiety (Patient taking differently: Take 1 mg by mouth 2 (two) times daily.) 60 tablet 5  . metFORMIN (GLUCOPHAGE-XR) 500 MG 24 hr tablet Take 2 tablets (1,000 mg total) by mouth daily with breakfast. 180 tablet 3  . pantoprazole (PROTONIX) 40 MG tablet Take 1 tablet by mouth once daily (Patient taking differently: Take 40 mg by mouth daily.) 90 tablet 0  . potassium chloride SA (KLOR-CON) 20 MEQ tablet Take 1 tablet (20 mEq total) by mouth daily. 90 tablet 3  . pravastatin (PRAVACHOL) 40 MG tablet Take 1 tablet by  mouth once daily (Patient taking differently: Take 40 mg by mouth daily.) 90 tablet 0  . Cholecalciferol (VITAMIN D) 2000 units tablet Take 2,000 Units by mouth daily. (Patient not taking: Reported on 06/03/2020)    . cyclobenzaprine (FLEXERIL) 5 MG tablet Take 1 tablet by mouth three times daily as needed for muscle spasm (Patient not taking: Reported on 06/03/2020) 40 tablet 0  . dimenhyDRINATE (DRAMAMINE) 50 MG tablet Take 50 mg by mouth daily as needed for nausea. (Patient not taking: Reported on 06/03/2020)    . guaiFENesin (MUCINEX) 600 MG 12 hr tablet Take 1 tablet (600 mg total) by mouth 2 (two) times daily as needed for cough or to loosen phlegm. (Patient not taking: Reported on 06/03/2020) 60 tablet 2  . metoprolol tartrate (LOPRESSOR) 100 MG tablet Take 1 tablet (100 mg total) by mouth 2 (two) times daily. 180 tablet 3  . Omega-3 Fatty Acids (FISH OIL) 1200 MG CAPS Take 1,200 mg by mouth daily. (Patient not taking: Reported on 06/03/2020)    . promethazine-dextromethorphan (PROMETHAZINE-DM) 6.25-15 MG/5ML syrup Take 5 mLs by mouth 4 (four) times daily as needed for cough. (Patient not taking: Reported on 06/03/2020) 118 mL 0  . vitamin E 400 UNIT capsule Take 400 Units by mouth daily.  (Patient not taking: Reported on 06/03/2020)    . ipratropium-albuterol (DUONEB) 0.5-2.5 (3) MG/3ML SOLN USE 1 AMPULE IN NEBULIZER 4 TIMES DAILY (Patient not taking: No sig reported) 360 mL 12  . predniSONE (DELTASONE) 10 MG tablet Take 40 mg daily for 1 day, 30 mg daily for 1 day, 20 mg daily for 1 days,10 mg daily for 1 day, then stop (Patient not taking: Reported on 06/03/2020) 10 tablet 0   No facility-administered medications prior to visit.     Review of Systems:   Constitutional:   No  weight loss, night sweats,  Fevers, chills, + fatigue, or  lassitude.  HEENT:   No headaches,  Difficulty swallowing,  Tooth/dental problems, or  Sore throat,                No sneezing, itching, ear ache, nasal congestion, post nasal drip,   CV:  No chest pain,  Orthopnea, PND, swelling in lower extremities, anasarca, dizziness, palpitations, syncope.   GI  No heartburn, indigestion, abdominal pain, nausea, vomiting, diarrhea, change in bowel habits, loss of appetite, bloody stools.   Resp:    No chest wall deformity  Skin: no rash or lesions.  GU: no dysuria, change in color of urine, no urgency or frequency.  No flank pain, no hematuria   MS:  No joint pain or swelling.  No decreased range of motion.  No back pain.    Physical Exam  BP 90/70 (BP Location: Right Arm, Cuff Size: Large)   Pulse (!) 101   Temp (!) 97.1 F (36.2 C) (Temporal)   Ht 5\' 2"  (1.575 m)   Wt 261 lb 3.2 oz (118.5 kg)   SpO2 97%   BMI 47.77 kg/m   GEN: A/Ox3; pleasant , NAD, obese in wc    HEENT:  [place]/AT,  EACs-clear, TMs-wnl, NOSE-clear, THROAT-clear, no lesions, no postnasal drip or exudate noted.   NECK:  Supple w/ fair ROM; no JVD; normal carotid impulses w/o bruits; no thyromegaly or nodules palpated; no lymphadenopathy.    RESP  Clear  P & A; w/o, wheezes/ rales/ or rhonchi. no accessory muscle use, no dullness to percussion  CARD:  RRR, no m/r/g, tr-1 peripheral edema, pulses  intact, no cyanosis or  clubbing.  GI:   Soft & nt; nml bowel sounds; no organomegaly or masses detected.   Musco: Warm bil, no deformities or joint swelling noted.   Neuro: alert, no focal deficits noted.    Skin: Warm, no lesions or rashes    Lab Results:    Imaging: DG Chest Port 1 View  Result Date: 05/23/2020 CLINICAL DATA:  Shortness of breath.  COVID. EXAM: PORTABLE CHEST 1 VIEW COMPARISON:  05/21/2020. FINDINGS: Mediastinum hilar structures normal. Cardiomegaly. No pulmonary venous congestion. Low lung volumes with mild bibasilar atelectasis. Mild left base infiltrate cannot be excluded. No pleural effusion or pneumothorax. Degenerative change thoracic spine. IMPRESSION: 1. Low lung volumes with mild bibasilar atelectasis. Mild left base infiltrate cannot be excluded. 2.  Stable cardiomegaly.  No pulmonary venous congestion. Electronically Signed   By: Marcello Moores  Register   On: 05/23/2020 06:38   DG Chest Portable 1 View  Result Date: 05/21/2020 CLINICAL DATA:  Cough and shortness of breath. EXAM: PORTABLE CHEST 1 VIEW COMPARISON:  12/01/2018 FINDINGS: 0944 hours. Cardiopericardial silhouette is at upper limits of normal for size. There is pulmonary vascular congestion without overt pulmonary edema. No overt airspace pulmonary edema or focal lung consolidation. No substantial pleural effusion. Telemetry leads overlie the chest. IMPRESSION: Pulmonary vascular congestion without acute cardiopulmonary findings. Electronically Signed   By: Misty Stanley M.D.   On: 05/21/2020 09:55   ECHOCARDIOGRAM LIMITED  Result Date: 05/22/2020    ECHOCARDIOGRAM LIMITED REPORT   Patient Name:   Andrea Perry Date of Exam: 05/22/2020 Medical Rec #:  [id number]     Height:       62.0 in Accession #:    [id number]    Weight:       271.8 lb Date of Birth:  05-Apr-1951     BSA:          2.178 m Patient Age:    79 years      BP:           141/89 mmHg Patient Gender: F             HR:           91 bpm. Exam Location:  Inpatient Procedure:  Limited Echo, Cardiac Doppler and Color Doppler Indications:    CHF  History:        Patient has prior history of Echocardiogram examinations, most                 recent 05/24/2019. COPD, Arrythmias:Atrial Fibrillation,                 Signs/Symptoms:Shortness of Breath and LE edema; Risk                 Factors:Hypertension, Dyslipidemia and Obesity.  Sonographer:    Dustin Flock Referring Phys: [id number] [place] Comments: Patient is morbidly obese. Image acquisition challenging due to patient body habitus and Image acquisition challenging due to COPD. COVID+ IMPRESSIONS  1. Left ventricular ejection fraction, by estimation, is 50 to 55%. The left ventricle has low normal function. Left ventricular endocardial border not optimally defined to evaluate regional wall motion. There is mild left ventricular hypertrophy. Left ventricular diastolic parameters are indeterminate.  2. Right ventricle is not well visualized but grossly normal size and systolic function. The estimated right ventricular systolic pressure is 03.5 mmHg.  3. The mitral valve is normal in structure. No evidence of mitral valve regurgitation.  4.  The aortic valve was not well visualized. Aortic valve regurgitation is not visualized.  5. The inferior vena cava is dilated in size with >50% respiratory variability, suggesting right atrial pressure of 8 mmHg. FINDINGS  Left Ventricle: Left ventricular ejection fraction, by estimation, is 50 to 55%. The left ventricle has low normal function. Left ventricular endocardial border not optimally defined to evaluate regional wall motion. The left ventricular internal cavity  size was normal in size. There is mild left ventricular hypertrophy. Left ventricular diastolic parameters are indeterminate. Right Ventricle: The right ventricular size is normal. Right ventricular systolic function is normal. There is normal pulmonary artery systolic pressure. The tricuspid regurgitant velocity is  2.57 m/s, and with an assumed right atrial pressure of 8 mmHg,  the estimated right ventricular systolic pressure is 15.1 mmHg. Pericardium: There is no evidence of pericardial effusion. Mitral Valve: The mitral valve is normal in structure. Tricuspid Valve: The tricuspid valve is normal in structure. Tricuspid valve regurgitation is trivial. Aortic Valve: The aortic valve was not well visualized. Aortic valve regurgitation is not visualized. Pulmonic Valve: The pulmonic valve was not well visualized. Pulmonic valve regurgitation is not visualized. Aorta: The aortic root is normal in size and structure. Venous: The inferior vena cava is dilated in size with greater than 50% respiratory variability, suggesting right atrial pressure of 8 mmHg. IAS/Shunts: The interatrial septum was not well visualized. LEFT VENTRICLE PLAX 2D LVIDd:         4.50 cm  Diastology LVIDs:         3.40 cm  LV e' medial:    8.05 cm/s LV PW:         1.40 cm  LV E/e' medial:  12.2 LV IVS:        1.00 cm  LV e' lateral:   8.27 cm/s LVOT diam:     2.20 cm  LV E/e' lateral: 11.9 LVOT Area:     3.80 cm  RIGHT VENTRICLE RV S prime:     6.64 cm/s LEFT ATRIUM         Index LA diam:    3.70 cm 1.70 cm/m   AORTA Ao Root diam: 2.70 cm MITRAL VALVE               TRICUSPID VALVE MV Area (PHT): 2.73 cm    TR Peak grad:   26.4 mmHg MV Decel Time: 278 msec    TR Vmax:        257.00 cm/s MV E velocity: 98.50 cm/s MV A velocity: 28.10 cm/s  SHUNTS MV E/A ratio:  3.51        Systemic Diam: 2.20 cm Oswaldo Milian MD Electronically signed by Oswaldo Milian MD Signature Date/Time: 05/22/2020/1:25:17 PM    Final       PFT Results Latest Ref Rng & Units 02/09/2019  FVC-Pre L 1.48  FVC-Predicted Pre % 53  FVC-Post L 1.84  FVC-Predicted Post % 66  Pre FEV1/FVC % % 45  Post FEV1/FCV % % 58  FEV1-Pre L 0.66  FEV1-Predicted Pre % 31  FEV1-Post L 1.07  DLCO uncorrected ml/min/mmHg 12.93  DLCO [facility]% % 72  DLVA Predicted % 81  TLC L 4.73  TLC %  Predicted % 102  RV % Predicted % 152    No results found for: NITRICOXIDE      Assessment & Plan:   COPD (chronic obstructive pulmonary disease) Recent COPD exacerbation with COVID-19 patient is clinically improving.  She is continue on her aggressive  pulmonary hygiene regimen.  Patient has significant deconditioning.  Refer to pulmonary rehab.  Close follow-up  Plan  . Patient Instructions  Continue Breztri two puffs twice daily (rinse mouth after use) Albuterol inhaler or neb As needed   Activity as tolerated.  Refer to Pulmonary rehab.  Chest xray today .  Follow up with Dr. Lamonte Sakai  In 6 weeks and As needed   Please contact office for sooner follow up if symptoms do not improve or worsen or seek emergency care         CHF (congestive heart failure) ([place]) Recent decompensated congestive heart failure secondary to A. fib with RVR and COVID-19 Patient is clinically improved.  Weight is down 15 pounds over the last year.  Lower extremity swelling is decreased.  Patient has a follow-up visit with primary care with labs in the next week.  Patient is continue low-sodium diet.  Appears euvolemic on exam.  Atrial fibrillation, rapid ([facility]) Recent A. fib with RVR most likely due to COVID-19 infection.  Patient is improved.  She is continue follow-up with cardiology.  Currently on Eliquis.  Asthma COPD with asthma.  Patient has significant reversibility on previous PFTs.  Patient is continue on her current regimen.  Appears clinically improved from recent exacerbation  Physical deconditioning Patient has significant physical deconditioning.  Refer to pulmonary rehab.     Rexene Edison, NP 06/03/2020

## 2020-06-03 NOTE — Patient Instructions (Addendum)
Continue Breztri two puffs twice daily (rinse mouth after use) Albuterol inhaler or neb As needed   Activity as tolerated.  Refer to Pulmonary rehab.  Chest xray today .  Follow up with Dr. Lamonte Sakai  In 6 weeks and As needed   Please contact office for sooner follow up if symptoms do not improve or worsen or seek emergency care

## 2020-06-03 NOTE — Assessment & Plan Note (Signed)
Recent COPD exacerbation with COVID-19 patient is clinically improving.  She is continue on her aggressive pulmonary hygiene regimen.  Patient has significant deconditioning.  Refer to pulmonary rehab.  Close follow-up  Plan  . Patient Instructions  Continue Breztri two puffs twice daily (rinse mouth after use) Albuterol inhaler or neb As needed   Activity as tolerated.  Refer to Pulmonary rehab.  Chest xray today .  Follow up with Dr. Lamonte Sakai  In 6 weeks and As needed   Please contact office for sooner follow up if symptoms do not improve or worsen or seek emergency care

## 2020-06-03 NOTE — Assessment & Plan Note (Signed)
Patient has significant physical deconditioning.  Refer to pulmonary rehab.

## 2020-06-03 NOTE — Assessment & Plan Note (Signed)
Recent decompensated congestive heart failure secondary to A. fib with RVR and COVID-19 Patient is clinically improved.  Weight is down 15 pounds over the last year.  Lower extremity swelling is decreased.  Patient has a follow-up visit with primary care with labs in the next week.  Patient is continue low-sodium diet.  Appears euvolemic on exam.

## 2020-06-04 ENCOUNTER — Encounter: Payer: Self-pay | Admitting: *Deleted

## 2020-06-04 NOTE — Progress Notes (Signed)
Sent to normal results pool.  Nothing further needed.

## 2020-06-06 ENCOUNTER — Telehealth: Payer: Self-pay | Admitting: Internal Medicine

## 2020-06-06 DIAGNOSIS — M545 Low back pain, unspecified: Secondary | ICD-10-CM | POA: Diagnosis not present

## 2020-06-06 DIAGNOSIS — E039 Hypothyroidism, unspecified: Secondary | ICD-10-CM | POA: Diagnosis not present

## 2020-06-06 DIAGNOSIS — U071 COVID-19: Secondary | ICD-10-CM | POA: Diagnosis not present

## 2020-06-06 DIAGNOSIS — G8929 Other chronic pain: Secondary | ICD-10-CM | POA: Diagnosis not present

## 2020-06-06 DIAGNOSIS — K219 Gastro-esophageal reflux disease without esophagitis: Secondary | ICD-10-CM | POA: Diagnosis not present

## 2020-06-06 DIAGNOSIS — J441 Chronic obstructive pulmonary disease with (acute) exacerbation: Secondary | ICD-10-CM | POA: Diagnosis not present

## 2020-06-06 DIAGNOSIS — J440 Chronic obstructive pulmonary disease with acute lower respiratory infection: Secondary | ICD-10-CM | POA: Diagnosis not present

## 2020-06-06 DIAGNOSIS — E876 Hypokalemia: Secondary | ICD-10-CM | POA: Diagnosis not present

## 2020-06-06 DIAGNOSIS — E1165 Type 2 diabetes mellitus with hyperglycemia: Secondary | ICD-10-CM | POA: Diagnosis not present

## 2020-06-06 DIAGNOSIS — I110 Hypertensive heart disease with heart failure: Secondary | ICD-10-CM | POA: Diagnosis not present

## 2020-06-06 DIAGNOSIS — J9601 Acute respiratory failure with hypoxia: Secondary | ICD-10-CM | POA: Diagnosis not present

## 2020-06-06 DIAGNOSIS — I5033 Acute on chronic diastolic (congestive) heart failure: Secondary | ICD-10-CM | POA: Diagnosis not present

## 2020-06-06 DIAGNOSIS — E785 Hyperlipidemia, unspecified: Secondary | ICD-10-CM | POA: Diagnosis not present

## 2020-06-06 DIAGNOSIS — I272 Pulmonary hypertension, unspecified: Secondary | ICD-10-CM | POA: Diagnosis not present

## 2020-06-06 DIAGNOSIS — J1282 Pneumonia due to coronavirus disease 2019: Secondary | ICD-10-CM | POA: Diagnosis not present

## 2020-06-06 DIAGNOSIS — I480 Paroxysmal atrial fibrillation: Secondary | ICD-10-CM | POA: Diagnosis not present

## 2020-06-06 NOTE — Telephone Encounter (Signed)
[facility]ie w/ [facility] is requesting PT verbals for 1w2, 2w2, 1w4.   Okay to LVM [phone]

## 2020-06-07 NOTE — Telephone Encounter (Signed)
Ok for verbals 

## 2020-06-07 NOTE — Telephone Encounter (Signed)
Orders left on [employer]

## 2020-06-10 ENCOUNTER — Other Ambulatory Visit: Payer: Self-pay

## 2020-06-11 ENCOUNTER — Encounter (HOSPITAL_COMMUNITY): Payer: Self-pay | Admitting: *Deleted

## 2020-06-11 ENCOUNTER — Encounter: Payer: Self-pay | Admitting: Internal Medicine

## 2020-06-11 ENCOUNTER — Telehealth: Payer: Self-pay | Admitting: Internal Medicine

## 2020-06-11 ENCOUNTER — Ambulatory Visit (INDEPENDENT_AMBULATORY_CARE_PROVIDER_SITE_OTHER): Payer: Medicare Other | Admitting: Internal Medicine

## 2020-06-11 VITALS — BP 136/72 | HR 108 | Temp 97.4°F | Ht 62.0 in

## 2020-06-11 DIAGNOSIS — E1165 Type 2 diabetes mellitus with hyperglycemia: Secondary | ICD-10-CM | POA: Diagnosis not present

## 2020-06-11 DIAGNOSIS — R252 Cramp and spasm: Secondary | ICD-10-CM | POA: Diagnosis not present

## 2020-06-11 DIAGNOSIS — Z23 Encounter for immunization: Secondary | ICD-10-CM

## 2020-06-11 DIAGNOSIS — U071 COVID-19: Secondary | ICD-10-CM

## 2020-06-11 DIAGNOSIS — Z20822 Contact with and (suspected) exposure to covid-19: Secondary | ICD-10-CM

## 2020-06-11 DIAGNOSIS — I5030 Unspecified diastolic (congestive) heart failure: Secondary | ICD-10-CM

## 2020-06-11 LAB — CBC WITH DIFFERENTIAL/PLATELET
Basophils Absolute: 0.1 10*3/uL (ref 0.0–0.1)
Basophils Relative: 1.1 % (ref 0.0–3.0)
Eosinophils Absolute: 0.2 10*3/uL (ref 0.0–0.7)
Eosinophils Relative: 1.8 % (ref 0.0–5.0)
HCT: 36.5 % (ref 36.0–46.0)
Hemoglobin: 12.4 g/dL (ref 12.0–15.0)
Lymphocytes Relative: 15.6 % (ref 12.0–46.0)
Lymphs Abs: 1.3 10*3/uL (ref 0.7–4.0)
MCHC: 34 g/dL (ref 30.0–36.0)
MCV: 91.4 fl (ref 78.0–100.0)
Monocytes Absolute: 0.6 10*3/uL (ref 0.1–1.0)
Monocytes Relative: 7.2 % (ref 3.0–12.0)
Neutro Abs: 6.4 10*3/uL (ref 1.4–7.7)
Neutrophils Relative %: 74.3 % (ref 43.0–77.0)
Platelets: 205 10*3/uL (ref 150.0–400.0)
RBC: 3.99 Mil/uL (ref 3.87–5.11)
RDW: 14.5 % (ref 11.5–15.5)
WBC: 8.6 10*3/uL (ref 4.0–10.5)

## 2020-06-11 LAB — BASIC METABOLIC PANEL
BUN: 16 mg/dL (ref 6–23)
CO2: 27 mEq/L (ref 19–32)
Calcium: 7.1 mg/dL — ABNORMAL LOW (ref 8.4–10.5)
Chloride: 95 mEq/L — ABNORMAL LOW (ref 96–112)
Creatinine, Ser: 1.25 mg/dL — ABNORMAL HIGH (ref 0.40–1.20)
GFR: 44.62 mL/min — ABNORMAL LOW (ref >60.00)
Glucose, Bld: 200 mg/dL — ABNORMAL HIGH (ref 70–99)
Potassium: 4.6 mEq/L (ref 3.5–5.1)
Sodium: 138 mEq/L (ref 135–145)

## 2020-06-11 LAB — HEPATIC FUNCTION PANEL
ALT: 13 U/L (ref 0–35)
AST: 14 U/L (ref 0–37)
Albumin: 3 g/dL — ABNORMAL LOW (ref 3.5–5.2)
Alkaline Phosphatase: 95 U/L (ref 39–117)
Bilirubin, Direct: 0.2 mg/dL (ref 0.0–0.3)
Total Bilirubin: 1.1 mg/dL (ref 0.2–1.2)
Total Protein: 6.1 g/dL (ref 6.0–8.3)

## 2020-06-11 LAB — MAGNESIUM: Magnesium: 0.5 mg/dL — CL (ref 1.5–2.5)

## 2020-06-11 MED ORDER — TIZANIDINE HCL 2 MG PO TABS: 2.0000 mg | ORAL_TABLET | Freq: Four times a day (QID) | ORAL | 2 refills | Status: DC | PRN

## 2020-06-11 NOTE — Patient Instructions (Signed)
You had the flu shot today  Please take all new medication as prescribed - the tizanidine as needed for cramping  Please continue all other medications as before, and refills have been done if requested.  Please have the pharmacy call with any other refills you may need.  Please continue your efforts at being more active, low cholesterol diet, and weight control.  You are otherwise up to date with prevention measures today.  Please keep your appointments with your specialists as you may have planned  Please go to the LAB at the blood drawing area for the tests to be done  You will be contacted by phone if any changes need to be made immediately.  Otherwise, you will receive a letter about your results with an explanation, but please check with MyChart first.  Please remember to sign up for MyChart if you have not done so, as this will be important to you in the future with finding out test results, communicating by private email, and scheduling acute appointments online when needed.  Please make an Appointment to return in 1 month

## 2020-06-11 NOTE — Progress Notes (Signed)
Patient ID: Andrea Perry, female   DOB: 1950/11/08, 70 y.o.   MRN: [id number]         Chief Complaint:: Hospital F/U (Rm 12. She would like to discuss her Metformin. She would like to know why she can't eat grapefruits. She mentioned that she feels weak since being home from the hospital. She's working on getting her strength back. Dizziness at times. )         HPI:  Andrea Perry is a 70 y.o. female here post covid infection and recent diastolic HF with hypomagnesia in the setting of copd, afib, and diarrhea.  Pt c/o primarily leg cramp but has overall muscular twitching and cramping to all extremities as well.  Last mag level normalized.  Pt denies chest pain, increased sob or doe, wheezing, orthopnea, PND, increased LE swelling, palpitations, dizziness or syncope.   Pt denies polydipsia, polyuria, Denies other focal neuro symptoms  Wt down 3 lbs, has lower appetite.  Due for flu shot Wt Readings from Last 3 Encounters:  06/03/20 261 lb 3.2 oz (118.5 kg)  05/25/20 264 lb 8.8 oz (120 kg)  10/31/19 277 lb (125.6 kg)   BP Readings from Last 3 Encounters:  06/14/20 (!) 98/55  06/11/20 136/72  06/03/20 90/70   Immunization History  Administered Date(s) Administered  . Fluad Quad(high Dose 65+) 02/09/2019, 06/11/2020  . Influenza, High Dose Seasonal PF 03/23/2018  . Influenza,inj,Quad PF,6+ Mos 03/01/2013, 02/14/2014, 03/08/2015, 04/16/2016, 01/12/2017  . Influenza-Unspecified 01/18/2017  . Pneumococcal Conjugate-13 03/30/2013  . Pneumococcal Polysaccharide-23 04/04/2014  . Tdap 09/24/2010   Health Maintenance Due  Topic Date Due  . COVID-19 Vaccine (1) Never done  . MAMMOGRAM  03/04/2018  . PNA vac Low Risk Adult (2 of 2 - PPSV23) 04/05/2019  . OPHTHALMOLOGY EXAM  08/29/2019  . FOOT EXAM  09/26/2019      Past Medical History:  Diagnosis Date  . ALLERGIC RHINITIS 08/16/2008  . ANXIETY 08/16/2008  . Arthritis    hands, knees, lower back  . ASTHMA 08/16/2008  . Asthma     BRONCHITIS     DR. Lamonte Sakai    . Bladder leak   . Cancer ([place])    MELANOMA      . Chronic lower back pain    problems with disc L2-5  . COPD 08/16/2008  . DEPRESSION 08/16/2008  . Diabetes mellitus without complication ([place])   . Dysrhythmia    hx AF  . Excessive daytime sleepiness 06/19/2015  . GENITAL HERPES 12/03/2006  . GERD (gastroesophageal reflux disease)   . H/O hiatal hernia   . HEMATOCHEZIA 06/20/2009  . Hemorrhoids   . History of cardioversion 10/30/14  . History of kidney stones   . HYPERLIPIDEMIA 08/16/2008  . HYPERTENSION 12/03/2006  . Hypertension   . Impaired glucose tolerance 09/21/2010  . Overactive bladder 10/20/2015  . Pneumonia    as a child  . Pulmonary HTN ([place]) 06/19/2015  . Shortness of breath dyspnea   . Sleep apnea    MILD NO CPAP ORDERED  . Snoring 06/19/2015   Past Surgical History:  Procedure Laterality Date  . ABDOMINAL HYSTERECTOMY    . APPENDECTOMY    . CARDIOVERSION N/A 03/20/2014   Procedure: CARDIOVERSION;  Surgeon: Josue Hector, MD;  Location: [facility] ENDOSCOPY;  Service: Cardiovascular;  Laterality: N/A;  . CARDIOVERSION N/A 10/30/2014   Procedure: CARDIOVERSION;  Surgeon: Josue Hector, MD;  Location: [facility] ENDOSCOPY;  Service: Cardiovascular;  Laterality: N/A;  . CARDIOVERSION N/A 04/03/2019  Procedure: CARDIOVERSION;  Surgeon: Dorothy Spark, MD;  Location: [facility] ENDOSCOPY;  Service: Cardiovascular;  Laterality: N/A;  . CARPAL TUNNEL RELEASE     Right + LEFT  . CESAREAN SECTION     x 1   . COLONOSCOPY  2004  . CYST EXCISION     RT ARM   . CYSTOCELE REPAIR N/A 10/25/2012   Procedure: ANTERIOR REPAIR (CYSTOCELE);  Surgeon: Gus Height, MD;  Location: [facility];  Service: Gynecology;  Laterality: N/A;  . HEEL SPUR SURGERY Bilateral   . KNEE SURGERY     Right + LEFT  . LUMBAR LAMINECTOMY/DECOMPRESSION MICRODISCECTOMY N/A 03/19/2016   Procedure: Laminectomy and Foraminotomy - Lumbar three-four - Lumbar four-five;  Surgeon: Eustace Moore, MD;  Location: [place];   Service: Neurosurgery;  Laterality: N/A;  . RADIOLOGY WITH ANESTHESIA Right 11/01/2014   Procedure: MRI RIGHT FOREARM;  Surgeon: Medication Radiologist, MD;  Location: [place];  Service: Radiology;  Laterality: Right;  . RADIOLOGY WITH ANESTHESIA Right 08/29/2015   Procedure: MRI - RIGHT FOREARM;  Surgeon: Medication Radiologist, MD;  Location: [place];  Service: Radiology;  Laterality: Right;  . RADIOLOGY WITH ANESTHESIA N/A 12/05/2015   Procedure: MRI SPINE WITHOUT;  Surgeon: Medication Radiologist, MD;  Location: [place];  Service: Radiology;  Laterality: N/A;  . SKIN CANCER EXCISION     BILAT SHOULDERS  . SPLIT NIGHT STUDY  09/04/2015  . TONSILLECTOMY AND ADENOIDECTOMY      reports that she quit smoking about 17 years ago. Her smoking use included cigarettes. She has a 45.00 pack-year smoking history. She has never used smokeless tobacco. She reports current alcohol use of about 2.0 standard drinks of alcohol per week. She reports that she does not use drugs. family history includes Colon polyps in her mother; Diabetes in her mother; Hyperlipidemia in her mother; Hypertension in her mother; Stroke in her father. Allergies  Allergen Reactions  . Tramadol Hcl Nausea And Vomiting and Other (See Comments)     mouth dryness, headache  . Codeine Nausea And Vomiting and Nausea Only  . Tape Rash    Plastic tape, bandaids and ekg leads, causes redness and rash  . Vicodin [Hydrocodone-Acetaminophen] Nausea And Vomiting   Current Outpatient Medications on File Prior to Visit  Medication Sig Dispense Refill  . albuterol (PROVENTIL) (2.5 MG/3ML) 0.083% nebulizer solution Use every 4 hours with Ipratropium (Patient taking differently: Take 2.5 mg by nebulization every 4 (four) hours as needed for wheezing or shortness of breath.) 360 mL 12  . albuterol (VENTOLIN HFA) 108 (90 Base) MCG/ACT inhaler Inhale 2 puffs by mouth every 6 hours as needed for wheezing or shortness of breath. (Patient taking differently:  Inhale 2 puffs into the lungs every 6 (six) hours as needed for wheezing or shortness of breath.) 18 g 5  . Blood Glucose Monitoring Suppl (FREESTYLE LITE) DEVI Use as directed daily E11.9 1 each 0  . BREZTRI AEROSPHERE 160-9-4.8 MCG/ACT AERO INHALE 2 PUFFS TWICE DAILY 11 g 1  . cetirizine (ZYRTEC) 10 MG tablet Take 10 mg by mouth daily.    . diphenoxylate-atropine (LOMOTIL) 2.5-0.025 MG tablet Take 1 tablet by mouth 4 (four) times daily as needed for diarrhea or loose stools. 40 tablet 0  . ELIQUIS 5 MG TABS tablet Take 1 tablet by mouth twice daily (Patient taking differently: Take 5 mg by mouth 2 (two) times daily.) 60 tablet 5  . escitalopram (LEXAPRO) 20 MG tablet Take 1 tablet by mouth once daily (Patient  taking differently: Take 20 mg by mouth at bedtime.) 90 tablet 1  . estradiol (ESTRACE) 1 MG tablet Take 1 mg by mouth daily.    Arna Medici 25 MCG tablet TAKE 1 TABLET BY MOUTH ONCE DAILY BEFORE BREAKFAST. PATIENT NEEDS AN OFFICE VISIT. 30 tablet 0  . glucose blood (FREESTYLE LITE) test strip Use as instructed once daily E11.9 100 each 12  . ipratropium (ATROVENT) 0.02 % nebulizer solution Take 2.5 mLs (0.5 mg total) by nebulization 4 (four) times daily. (Patient taking differently: Take 0.5 mg by nebulization every 4 (four) hours as needed for wheezing or shortness of breath.) 300 mL 12  . irbesartan (AVAPRO) 150 MG tablet Take 1 tablet by mouth once daily (Patient taking differently: Take 150 mg by mouth daily.) 90 tablet 1  . Lancets MISC Use as directed once daily E11.9 100 each 11  . LORazepam (ATIVAN) 1 MG tablet Take 1 tablet by mouth twice daily as needed for anxiety (Patient taking differently: Take 1 mg by mouth 2 (two) times daily.) 60 tablet 5  . metFORMIN (GLUCOPHAGE-XR) 500 MG 24 hr tablet Take 2 tablets (1,000 mg total) by mouth daily with breakfast. 180 tablet 3  . pantoprazole (PROTONIX) 40 MG tablet Take 1 tablet by mouth once daily 90 tablet 0  . pravastatin (PRAVACHOL) 40  MG tablet Take 1 tablet by mouth once daily (Patient taking differently: Take 40 mg by mouth daily.) 90 tablet 0   No current facility-administered medications on file prior to visit.        ROS:  All others reviewed and negative.  Objective        PE:  BP 136/72   Pulse (!) 108   Temp (!) 97.4 F (36.3 C) (Oral)   Ht 5\' 2"  (1.575 m)   SpO2 98%   BMI 47.77 kg/m                 Constitutional: Pt appears in NAD               HENT: Head: NCAT.                Right Ear: External ear normal.                 Left Ear: External ear normal.                Eyes: . Pupils are equal, round, and reactive to light. Conjunctivae and EOM are normal               Nose: without d/c or deformity               Neck: Neck supple. Gross normal ROM               Cardiovascular: Normal rate and regular rhythm.                 Pulmonary/Chest: Effort normal and breath sounds without rales or wheezing.                Abd:  Soft, NT, ND, + BS, no organomegaly               Neurological: Pt is alert. At baseline orientation, motor grossly intact               Skin: Skin is warm. No rashes, no other new lesions, LE edema - none  Psychiatric: Pt behavior is normal without agitation   Micro: none  Cardiac tracings I have personally interpreted today:  none  Pertinent Radiological findings (summarize): none   Lab Results  Component Value Date   WBC 5.7 06/14/2020   HGB 11.4 (L) 06/14/2020   HCT 33.0 (L) 06/14/2020   PLT 224 06/14/2020   GLUCOSE 175 (H) 06/14/2020   CHOL 155 09/27/2019   TRIG 163 (H) 05/21/2020   HDL 38.20 (L) 09/27/2019   LDLDIRECT 87.0 09/27/2019   LDLCALC 87 03/23/2018   ALT 16 06/13/2020   AST 18 06/13/2020   NA 133 (L) 06/14/2020   K 3.4 (L) 06/14/2020   CL 93 (L) 06/14/2020   CREATININE 1.32 (H) 06/14/2020   BUN 15 06/14/2020   CO2 24 06/14/2020   TSH 4.548 (H) 06/14/2020   INR 1.00 03/18/2016   HGBA1C 7.3 (H) 05/21/2020   MICROALBUR 96.6 (H)  03/17/2017   Assessment/Plan:  LILYAN PRETE is a 70 y.o. White or Caucasian [1] female with  has a past medical history of ALLERGIC RHINITIS (08/16/2008), ANXIETY (08/16/2008), Arthritis, ASTHMA (08/16/2008), Asthma, Bladder leak, Cancer ([place]), Chronic lower back pain, COPD (08/16/2008), DEPRESSION (08/16/2008), Diabetes mellitus without complication ([place]), Dysrhythmia, Excessive daytime sleepiness (06/19/2015), GENITAL HERPES (12/03/2006), GERD (gastroesophageal reflux disease), H/O hiatal hernia, HEMATOCHEZIA (06/20/2009), Hemorrhoids, History of cardioversion (10/30/14), History of kidney stones, HYPERLIPIDEMIA (08/16/2008), HYPERTENSION (12/03/2006), Hypertension, Impaired glucose tolerance (09/21/2010), Overactive bladder (10/20/2015), Pneumonia, Pulmonary HTN ([place]) (06/19/2015), Shortness of breath dyspnea, Sleep apnea, and Snoring (06/19/2015).  Hypomagnesemia For f/u lab,  to f/u any worsening symptoms or concerns  CHF (congestive heart failure) (HCC) Stable volume, cont current med tx  COVID-19 D/w pt - for booster at 6 mo  Diabetes  Lab Results  Component Value Date   HGBA1C 7.3 (H) 05/21/2020   Stable, pt to continue current medical treatment metformin  Current Outpatient Medications (Endocrine & Metabolic):  .  estradiol (ESTRACE) 1 MG tablet, Take 1 mg by mouth daily. .  EUTHYROX 25 MCG tablet, TAKE 1 TABLET BY MOUTH ONCE DAILY BEFORE BREAKFAST. PATIENT NEEDS AN OFFICE VISIT. Marland Kitchen  metFORMIN (GLUCOPHAGE-XR) 500 MG 24 hr tablet, Take 2 tablets (1,000 mg total) by mouth daily with breakfast.  Current Outpatient Medications (Cardiovascular):  .  irbesartan (AVAPRO) 150 MG tablet, Take 1 tablet by mouth once daily (Patient taking differently: Take 150 mg by mouth daily.) .  pravastatin (PRAVACHOL) 40 MG tablet, Take 1 tablet by mouth once daily (Patient taking differently: Take 40 mg by mouth daily.) .  furosemide (LASIX) 40 MG tablet, Take 1 tablet (40 mg total) by mouth daily. .  metoprolol  tartrate (LOPRESSOR) 100 MG tablet, Take 100 mg by mouth 2 (two) times daily.  Current Outpatient Medications (Respiratory):  .  albuterol (PROVENTIL) (2.5 MG/3ML) 0.083% nebulizer solution, Use every 4 hours with Ipratropium (Patient taking differently: Take 2.5 mg by nebulization every 4 (four) hours as needed for wheezing or shortness of breath.) .  albuterol (VENTOLIN HFA) 108 (90 Base) MCG/ACT inhaler, Inhale 2 puffs by mouth every 6 hours as needed for wheezing or shortness of breath. (Patient taking differently: Inhale 2 puffs into the lungs every 6 (six) hours as needed for wheezing or shortness of breath.) .  BREZTRI AEROSPHERE 160-9-4.8 MCG/ACT AERO, INHALE 2 PUFFS TWICE DAILY .  cetirizine (ZYRTEC) 10 MG tablet, Take 10 mg by mouth daily. Marland Kitchen  ipratropium (ATROVENT) 0.02 % nebulizer solution, Take 2.5 mLs (0.5 mg total) by nebulization 4 (four) times daily. (  Patient taking differently: Take 0.5 mg by nebulization every 4 (four) hours as needed for wheezing or shortness of breath.) .  guaiFENesin (MUCINEX) 600 MG 12 hr tablet, Take 600 mg by mouth 2 (two) times daily as needed for cough.  Current Outpatient Medications (Analgesics):  .  acetaminophen (TYLENOL) 500 MG tablet, Take 500 mg by mouth every 6 (six) hours as needed for mild pain or moderate pain.  Current Outpatient Medications (Hematological):  Marland Kitchen  ELIQUIS 5 MG TABS tablet, Take 1 tablet by mouth twice daily (Patient taking differently: Take 5 mg by mouth 2 (two) times daily.)  Current Outpatient Medications (Other):  .  Blood Glucose Monitoring Suppl (FREESTYLE LITE) DEVI, Use as directed daily E11.9 .  diphenoxylate-atropine (LOMOTIL) 2.5-0.025 MG tablet, Take 1 tablet by mouth 4 (four) times daily as needed for diarrhea or loose stools. Marland Kitchen  escitalopram (LEXAPRO) 20 MG tablet, Take 1 tablet by mouth once daily (Patient taking differently: Take 20 mg by mouth at bedtime.) .  glucose blood (FREESTYLE LITE) test strip, Use as  instructed once daily E11.9 .  Lancets MISC, Use as directed once daily E11.9 .  LORazepam (ATIVAN) 1 MG tablet, Take 1 tablet by mouth twice daily as needed for anxiety (Patient taking differently: Take 1 mg by mouth 2 (two) times daily.) .  pantoprazole (PROTONIX) 40 MG tablet, Take 1 tablet by mouth once daily .  tiZANidine (ZANAFLEX) 2 MG tablet, Take 1 tablet (2 mg total) by mouth every 6 (six) hours as needed for muscle spasms. .  calcium-vitamin D (OSCAL 500/200 D-3) 500-200 MG-UNIT tablet, Take 1 tablet by mouth 2 (two) times daily. .  Magnesium Oxide 400 MG CAPS, Take 1 capsule (400 mg total) by mouth 2 (two) times daily. 1 tab by mouth daily .  potassium chloride SA (KLOR-CON) 20 MEQ tablet, Take 1 tablet (20 mEq total) by mouth daily.,  Muscle cramps For tizanidine prn  Followup: Return in about 4 weeks (around 07/09/2020).  Cathlean Cower, MD 06/18/2020 9:07 PM [facility]

## 2020-06-11 NOTE — Progress Notes (Signed)
Received referral from Dr.Byrum for this pt to participate in pulmonary rehab with the the diagnosis of Other Emphysema. Clinical review of pt follow up appt on 2/14 Pulmonary office note.  Pt with Covid Risk Score - 6. Pt is currently undergoing home physical therapy.  Additional visits requested by the PT.  Pt will need to be able to ambulate and engage in exercise consistently for a group setting. Pt has upcoming appt on 3/9 with cardiology in follow up from her admission for Covid and Afib with [facility].  Will forward to support staff for  verification of insurance eligibility/benefits with pt consent. Cherre Huger, BSN Cardiac and Training and development officer

## 2020-06-11 NOTE — Telephone Encounter (Signed)
Critically low Magnesium 0.45 -->rec IV transfusion mag at [facility] or [facility] hospital due to critically low  Nurse to Review Serious side effects low mag:  Severe muscle switching, chest pain, seizure activity, heart flutter,palpitaitons, heart can could go into funny rhythm --> rec go to ED for IV transfusion of magnesium   If does not want to go rec  Is she taking oral magnesium? --> if not rec 250 mg mag oxide 2x per day otc  Call PCP to f/u sch appt asap

## 2020-06-12 ENCOUNTER — Telehealth: Payer: Self-pay | Admitting: Internal Medicine

## 2020-06-12 ENCOUNTER — Encounter: Payer: Self-pay | Admitting: Internal Medicine

## 2020-06-12 MED ORDER — MAGNESIUM OXIDE 400 MG PO CAPS: ORAL_CAPSULE | ORAL | 1 refills | Status: DC

## 2020-06-12 NOTE — Telephone Encounter (Signed)
We were unable to reach patient nurse tried 3x and 1x with me leaving pt message and nurse left pt a message advised to return call if did not understand message 06/11/20  Dr. Gayland Curry

## 2020-06-12 NOTE — Telephone Encounter (Signed)
[facility] FYI   Critical Magnesium Lab Result : 0.45 (Read back) Ref range is 1.5 - 2.5 Ordering MD Dr. Cathlean Cower, drawn today at 2:21pm  Please note below and advise.

## 2020-06-12 NOTE — Telephone Encounter (Signed)
LMOM today due to pt no answering phone - pt needs IV mag at ED  I will also send mag oxide to pharmacy  Also to send letter for above and ask pt to make ROV next able

## 2020-06-13 ENCOUNTER — Emergency Department (HOSPITAL_COMMUNITY): Payer: Medicare Other

## 2020-06-13 ENCOUNTER — Observation Stay (HOSPITAL_COMMUNITY)
Admission: EM | Admit: 2020-06-13 | Discharge: 2020-06-14 | Disposition: A | Discharge status: Home / Self Care | Payer: Medicare Other | Attending: Internal Medicine | Admitting: Internal Medicine

## 2020-06-13 ENCOUNTER — Encounter (HOSPITAL_COMMUNITY): Payer: Self-pay | Admitting: Internal Medicine

## 2020-06-13 ENCOUNTER — Other Ambulatory Visit: Payer: Self-pay

## 2020-06-13 DIAGNOSIS — Z7901 Long term (current) use of anticoagulants: Secondary | ICD-10-CM | POA: Insufficient documentation

## 2020-06-13 DIAGNOSIS — Z8616 Personal history of COVID-19: Secondary | ICD-10-CM | POA: Insufficient documentation

## 2020-06-13 DIAGNOSIS — I5032 Chronic diastolic (congestive) heart failure: Secondary | ICD-10-CM | POA: Diagnosis not present

## 2020-06-13 DIAGNOSIS — E119 Type 2 diabetes mellitus without complications: Secondary | ICD-10-CM | POA: Diagnosis not present

## 2020-06-13 DIAGNOSIS — J45909 Unspecified asthma, uncomplicated: Secondary | ICD-10-CM | POA: Insufficient documentation

## 2020-06-13 DIAGNOSIS — I4891 Unspecified atrial fibrillation: Secondary | ICD-10-CM | POA: Diagnosis not present

## 2020-06-13 DIAGNOSIS — I10 Essential (primary) hypertension: Secondary | ICD-10-CM | POA: Diagnosis present

## 2020-06-13 DIAGNOSIS — Z87891 Personal history of nicotine dependence: Secondary | ICD-10-CM | POA: Diagnosis not present

## 2020-06-13 DIAGNOSIS — J449 Chronic obstructive pulmonary disease, unspecified: Secondary | ICD-10-CM | POA: Diagnosis not present

## 2020-06-13 DIAGNOSIS — R0602 Shortness of breath: Secondary | ICD-10-CM | POA: Diagnosis not present

## 2020-06-13 DIAGNOSIS — E039 Hypothyroidism, unspecified: Secondary | ICD-10-CM | POA: Diagnosis present

## 2020-06-13 DIAGNOSIS — E612 Magnesium deficiency: Secondary | ICD-10-CM | POA: Diagnosis present

## 2020-06-13 DIAGNOSIS — Z7984 Long term (current) use of oral hypoglycemic drugs: Secondary | ICD-10-CM | POA: Insufficient documentation

## 2020-06-13 DIAGNOSIS — I272 Pulmonary hypertension, unspecified: Secondary | ICD-10-CM | POA: Diagnosis present

## 2020-06-13 DIAGNOSIS — I4819 Other persistent atrial fibrillation: Secondary | ICD-10-CM | POA: Diagnosis present

## 2020-06-13 DIAGNOSIS — Z85820 Personal history of malignant melanoma of skin: Secondary | ICD-10-CM | POA: Diagnosis not present

## 2020-06-13 DIAGNOSIS — Z79899 Other long term (current) drug therapy: Secondary | ICD-10-CM | POA: Insufficient documentation

## 2020-06-13 DIAGNOSIS — I110 Hypertensive heart disease with heart failure: Secondary | ICD-10-CM | POA: Insufficient documentation

## 2020-06-13 LAB — TROPONIN I (HIGH SENSITIVITY)
Troponin I (High Sensitivity): 6 ng/L (ref <18)
Troponin I (High Sensitivity): 4 ng/L (ref <18)

## 2020-06-13 LAB — BRAIN NATRIURETIC PEPTIDE: B Natriuretic Peptide: 199.9 pg/mL — ABNORMAL HIGH (ref 0.0–100.0)

## 2020-06-13 LAB — COMPREHENSIVE METABOLIC PANEL
ALT: 16 U/L (ref 0–44)
AST: 18 U/L (ref 15–41)
Albumin: 2.4 g/dL — ABNORMAL LOW (ref 3.5–5.0)
Alkaline Phosphatase: 94 U/L (ref 38–126)
Anion gap: 15 (ref 5–15)
BUN: 12 mg/dL (ref 8–23)
CO2: 26 mmol/L (ref 22–32)
Calcium: 6.7 mg/dL — ABNORMAL LOW (ref 8.9–10.3)
Chloride: 95 mmol/L — ABNORMAL LOW (ref 98–111)
Creatinine, Ser: 1.31 mg/dL — ABNORMAL HIGH (ref 0.44–1.00)
GFR, Estimated: 45 mL/min — ABNORMAL LOW (ref >60)
Glucose, Bld: 171 mg/dL — ABNORMAL HIGH (ref 70–99)
Potassium: 4.5 mmol/L (ref 3.5–5.1)
Sodium: 136 mmol/L (ref 135–145)
Total Bilirubin: 1.2 mg/dL (ref 0.3–1.2)
Total Protein: 6.7 g/dL (ref 6.5–8.1)

## 2020-06-13 LAB — CBC
HCT: 36.9 % (ref 36.0–46.0)
Hemoglobin: 12.4 g/dL (ref 12.0–15.0)
MCH: 31.2 pg (ref 26.0–34.0)
MCHC: 33.6 g/dL (ref 30.0–36.0)
MCV: 92.9 fL (ref 80.0–100.0)
Platelets: 233 10*3/uL (ref 150–400)
RBC: 3.97 MIL/uL (ref 3.87–5.11)
RDW: 14.1 % (ref 11.5–15.5)
WBC: 10 10*3/uL (ref 4.0–10.5)
nRBC: 0 % (ref 0.0–0.2)

## 2020-06-13 LAB — CBG MONITORING, ED
Glucose-Capillary: 150 mg/dL — ABNORMAL HIGH (ref 70–99)
Glucose-Capillary: 128 mg/dL — ABNORMAL HIGH (ref 70–99)

## 2020-06-13 LAB — PHOSPHORUS: Phosphorus: 2.5 mg/dL (ref 2.5–4.6)

## 2020-06-13 LAB — MAGNESIUM: Magnesium: 0.6 mg/dL — CL (ref 1.7–2.4)

## 2020-06-13 LAB — GLUCOSE, CAPILLARY: Glucose-Capillary: 121 mg/dL — ABNORMAL HIGH (ref 70–99)

## 2020-06-13 MED ORDER — MAGNESIUM SULFATE 4 GM/100ML IV SOLN
4.0000 g | Freq: Once | INTRAVENOUS | Status: AC
  Administered 2020-06-13: 4 g via INTRAVENOUS
  Filled 2020-06-13 (×2): qty 100

## 2020-06-13 MED ORDER — OMEGA-3-ACID ETHYL ESTERS 1 G PO CAPS
1.0000 g | ORAL_CAPSULE | Freq: Every day | ORAL | Status: DC
  Administered 2020-06-14: 1 g via ORAL
  Filled 2020-06-13: qty 1

## 2020-06-13 MED ORDER — UMECLIDINIUM BROMIDE 62.5 MCG/INH IN AEPB
1.0000 | INHALATION_SPRAY | Freq: Every day | RESPIRATORY_TRACT | Status: DC
  Administered 2020-06-14: 1 via RESPIRATORY_TRACT
  Filled 2020-06-13: qty 7

## 2020-06-13 MED ORDER — METOPROLOL TARTRATE 100 MG PO TABS
100.0000 mg | ORAL_TABLET | Freq: Two times a day (BID) | ORAL | Status: DC
  Filled 2020-06-13: qty 1

## 2020-06-13 MED ORDER — TIZANIDINE HCL 4 MG PO TABS
2.0000 mg | ORAL_TABLET | Freq: Four times a day (QID) | ORAL | Status: DC | PRN
  Filled 2020-06-13: qty 1

## 2020-06-13 MED ORDER — PANTOPRAZOLE SODIUM 40 MG PO TBEC
40.0000 mg | DELAYED_RELEASE_TABLET | Freq: Every day | ORAL | Status: DC
  Administered 2020-06-14: 40 mg via ORAL
  Filled 2020-06-13: qty 1

## 2020-06-13 MED ORDER — APIXABAN 5 MG PO TABS
5.0000 mg | ORAL_TABLET | Freq: Two times a day (BID) | ORAL | Status: DC
  Administered 2020-06-13 – 2020-06-14 (×2): 5 mg via ORAL
  Filled 2020-06-13 (×2): qty 1

## 2020-06-13 MED ORDER — METOPROLOL TARTRATE 25 MG PO TABS
100.0000 mg | ORAL_TABLET | Freq: Once | ORAL | Status: AC
  Administered 2020-06-13: 100 mg via ORAL
  Filled 2020-06-13: qty 4

## 2020-06-13 MED ORDER — ESCITALOPRAM OXALATE 10 MG PO TABS
20.0000 mg | ORAL_TABLET | Freq: Every day | ORAL | Status: DC
  Administered 2020-06-13: 20 mg via ORAL
  Filled 2020-06-13: qty 2

## 2020-06-13 MED ORDER — FLUTICASONE FUROATE-VILANTEROL 200-25 MCG/INH IN AEPB
1.0000 | INHALATION_SPRAY | Freq: Every day | RESPIRATORY_TRACT | Status: DC
  Administered 2020-06-14: 1 via RESPIRATORY_TRACT
  Filled 2020-06-13: qty 28

## 2020-06-13 MED ORDER — LEVOTHYROXINE SODIUM 25 MCG PO TABS
25.0000 ug | ORAL_TABLET | Freq: Every day | ORAL | Status: DC
  Administered 2020-06-14: 25 ug via ORAL
  Filled 2020-06-13: qty 1

## 2020-06-13 MED ORDER — MAGNESIUM OXIDE 400 (241.3 MG) MG PO TABS: 400.0000 mg | ORAL_TABLET | Freq: Every day | ORAL | Status: DC

## 2020-06-13 MED ORDER — BUDESON-GLYCOPYRROL-FORMOTEROL 160-9-4.8 MCG/ACT IN AERO: 2.0000 | INHALATION_SPRAY | Freq: Two times a day (BID) | RESPIRATORY_TRACT | Status: DC

## 2020-06-13 MED ORDER — IRBESARTAN 150 MG PO TABS
150.0000 mg | ORAL_TABLET | Freq: Every day | ORAL | Status: DC
  Administered 2020-06-14: 150 mg via ORAL
  Filled 2020-06-13: qty 2
  Filled 2020-06-13: qty 1

## 2020-06-13 MED ORDER — ACETAMINOPHEN 325 MG PO TABS
650.0000 mg | ORAL_TABLET | Freq: Four times a day (QID) | ORAL | Status: DC | PRN
  Administered 2020-06-14: 650 mg via ORAL
  Filled 2020-06-13: qty 2

## 2020-06-13 MED ORDER — ALBUTEROL SULFATE HFA 108 (90 BASE) MCG/ACT IN AERS
2.0000 | INHALATION_SPRAY | Freq: Four times a day (QID) | RESPIRATORY_TRACT | Status: DC | PRN
  Filled 2020-06-13: qty 6.7

## 2020-06-13 MED ORDER — PRAVASTATIN SODIUM 40 MG PO TABS
40.0000 mg | ORAL_TABLET | Freq: Every day | ORAL | Status: DC
  Administered 2020-06-14: 40 mg via ORAL
  Filled 2020-06-13: qty 1

## 2020-06-13 MED ORDER — LORAZEPAM 1 MG PO TABS
1.0000 mg | ORAL_TABLET | Freq: Two times a day (BID) | ORAL | Status: DC
  Administered 2020-06-13 – 2020-06-14 (×2): 1 mg via ORAL
  Filled 2020-06-13 (×2): qty 1

## 2020-06-13 MED ORDER — INSULIN ASPART 100 UNIT/ML ~~LOC~~ SOLN
0.0000 [IU] | Freq: Three times a day (TID) | SUBCUTANEOUS | Status: DC
  Administered 2020-06-14: 5 [IU] via SUBCUTANEOUS
  Administered 2020-06-14: 3 [IU] via SUBCUTANEOUS
  Administered 2020-06-14: 1 [IU] via SUBCUTANEOUS

## 2020-06-13 MED ORDER — ACETAMINOPHEN 650 MG RE SUPP: 650.0000 mg | Freq: Four times a day (QID) | RECTAL | Status: DC | PRN

## 2020-06-13 NOTE — ED Notes (Signed)
RN reported critical to MD Lanny Hurst

## 2020-06-13 NOTE — ED Provider Notes (Signed)
[facility] EMERGENCY DEPARTMENT Provider Note   CSN: [id number] Arrival date & time: 06/13/20  1233     History Chief Complaint: low magnesium  Andrea Perry is a 70 y.o. female w/ h/o COPD, Afib on Eliquis, HTN, HLD, T2DM, obesity, and OSA unable to tolerate CPAP who presents to the ED for low magnesium. Patient contacted by PCP after outpatient labs demonstrated magnesium 0.5 drawn 2 days ago. Previously had low magnesium during most recent hospitalization requiring repletion. Currently takes Lasix at home. Denies confusion, difficulty walking, or seizures. Patient states she has been feeling well since being discharged home several weeks ago. Denies chest pain, fever, chills, vomiting, or abdominal pain. She reports SOB and cough, but states she is at her respiratory baseline.  The history is provided by the patient and medical records.  Illness Quality:  Low magnesium Severity:  Severe Onset quality:  Sudden Duration:  2 days Timing:  Constant Progression:  Unchanged Chronicity:  Recurrent Context:  Told magnesium low and needed to come to ED for repletion Relieved by:  None tried Worsened by:  None tried Ineffective treatments:  None tried Associated symptoms: cough and shortness of breath   Associated symptoms: no abdominal pain, no chest pain, no diarrhea, no ear pain, no fever, no nausea, no rash, no sore throat, no vomiting and no wheezing        Past Medical History:  Diagnosis Date  . ALLERGIC RHINITIS 08/16/2008  . ANXIETY 08/16/2008  . Arthritis    hands, knees, lower back  . ASTHMA 08/16/2008  . Asthma    BRONCHITIS     DR. Lamonte Sakai    . Bladder leak   . Cancer ([place])    MELANOMA      . Chronic lower back pain    problems with disc L2-5  . COPD 08/16/2008  . DEPRESSION 08/16/2008  . Diabetes mellitus without complication ([place])   . Dysrhythmia    hx AF  . Excessive daytime sleepiness 06/19/2015  . GENITAL HERPES 12/03/2006  . GERD  (gastroesophageal reflux disease)   . H/O hiatal hernia   . HEMATOCHEZIA 06/20/2009  . Hemorrhoids   . History of cardioversion 10/30/14  . History of kidney stones   . HYPERLIPIDEMIA 08/16/2008  . HYPERTENSION 12/03/2006  . Hypertension   . Impaired glucose tolerance 09/21/2010  . Overactive bladder 10/20/2015  . Pneumonia    as a child  . Pulmonary HTN ([place]) 06/19/2015  . Shortness of breath dyspnea   . Sleep apnea    MILD NO CPAP ORDERED  . Snoring 06/19/2015    Patient Active Problem List   Diagnosis Date Noted  . Hypomagnesemia 06/11/2020  . Physical deconditioning 06/03/2020  . COVID-19 05/21/2020  . CHF (congestive heart failure) ([place]) 05/21/2020  . Suspected COVID-19 virus infection 05/09/2020  . Complex care coordination 02/27/2019  . Healthcare maintenance 02/09/2019  . Medication management 12/01/2018  . Shortness of breath 12/01/2018  . Former smoker 12/01/2018  . Atrial fibrillation, rapid ([place]) 04/29/2018  . Acute gastroenteritis 03/23/2018  . GERD (gastroesophageal reflux disease) 01/03/2018  . Proteinuria 09/15/2017  . Insomnia 03/17/2017  . Rash 11/04/2016  . Hematoma 11/04/2016  . Pre-operative examination for internal medicine 09/11/2016  . S/P lumbar laminectomy 03/19/2016  . Overactive bladder 10/20/2015  . Pulmonary HTN ([place]) 06/19/2015  . Mild obstructive sleep apnea 06/19/2015  . Snoring 06/19/2015  . Abdominal pain 04/18/2015  . Cough 03/08/2015  . A-fib ([place]) 02/05/2014  . Preop cardiovascular  exam 02/05/2014  . Arthritis of knee 02/05/2014  . Arm pain, right 08/31/2013  . Low back pain 09/26/2011  . Leg pain 07/07/2011  . Dizziness 07/07/2011  . Menopausal disorder 07/07/2011  . Hemorrhage of rectum and anus 11/04/2010  . Internal thrombosed hemorrhoids 11/04/2010  . Internal hemorrhoids with other complication 16/38/4665  . External hemorrhoids without mention of complication 99/35/7017  . Dyspepsia 09/24/2010  . Peripheral edema 09/24/2010   . Diabetes ([place]) 09/21/2010  . Preventative health care 09/21/2010  . IBS 06/20/2009  . HEMATOCHEZIA 06/20/2009  . Elevated lipids 08/16/2008  . Anxiety state 08/16/2008  . Depression 08/16/2008  . Allergic rhinitis 08/16/2008  . Asthma 08/16/2008  . COPD (chronic obstructive pulmonary disease) ([place]) 08/16/2008  . GENITAL HERPES 12/03/2006  . Hypothyroidism 12/03/2006  . Essential hypertension 12/03/2006    Past Surgical History:  Procedure Laterality Date  . ABDOMINAL HYSTERECTOMY    . APPENDECTOMY    . CARDIOVERSION N/A 03/20/2014   Procedure: CARDIOVERSION;  Surgeon: Josue Hector, MD;  Location: [facility] ENDOSCOPY;  Service: Cardiovascular;  Laterality: N/A;  . CARDIOVERSION N/A 10/30/2014   Procedure: CARDIOVERSION;  Surgeon: Josue Hector, MD;  Location: [facility] ENDOSCOPY;  Service: Cardiovascular;  Laterality: N/A;  . CARDIOVERSION N/A 04/03/2019   Procedure: CARDIOVERSION;  Surgeon: Dorothy Spark, MD;  Location: [facility] ENDOSCOPY;  Service: Cardiovascular;  Laterality: N/A;  . CARPAL TUNNEL RELEASE     Right + LEFT  . CESAREAN SECTION     x 1   . COLONOSCOPY  2004  . CYST EXCISION     RT ARM   . CYSTOCELE REPAIR N/A 10/25/2012   Procedure: ANTERIOR REPAIR (CYSTOCELE);  Surgeon: Gus Height, MD;  Location: [facility];  Service: Gynecology;  Laterality: N/A;  . HEEL SPUR SURGERY Bilateral   . KNEE SURGERY     Right + LEFT  . LUMBAR LAMINECTOMY/DECOMPRESSION MICRODISCECTOMY N/A 03/19/2016   Procedure: Laminectomy and Foraminotomy - Lumbar three-four - Lumbar four-five;  Surgeon: Eustace Moore, MD;  Location: [facility];  Service: Neurosurgery;  Laterality: N/A;  . RADIOLOGY WITH ANESTHESIA Right 11/01/2014   Procedure: MRI RIGHT FOREARM;  Surgeon: Medication Radiologist, MD;  Location: [facility];  Service: Radiology;  Laterality: Right;  . RADIOLOGY WITH ANESTHESIA Right 08/29/2015   Procedure: MRI - RIGHT FOREARM;  Surgeon: Medication Radiologist, MD;  Location: [facility];  Service: Radiology;   Laterality: Right;  . RADIOLOGY WITH ANESTHESIA N/A 12/05/2015   Procedure: MRI SPINE WITHOUT;  Surgeon: Medication Radiologist, MD;  Location: [facility];  Service: Radiology;  Laterality: N/A;  . SKIN CANCER EXCISION     BILAT SHOULDERS  . SPLIT NIGHT STUDY  09/04/2015  . TONSILLECTOMY AND ADENOIDECTOMY       OB History   No obstetric history on file.     Family History  Problem Relation Age of Onset  . Diabetes Mother   . Hyperlipidemia Mother   . Hypertension Mother   . Colon polyps Mother   . Stroke Father   . Colon cancer Neg Hx     Social History   Tobacco Use  . Smoking status: Former Smoker    Packs/day: 1.50    Years: 30.00    Pack years: 45.00    Types: Cigarettes    Quit date: 04/21/2003    Years since quitting: 17.1  . Smokeless tobacco: Never Used  Vaping Use  . Vaping Use: Never used  Substance Use Topics  . Alcohol use: Yes  Alcohol/week: 2.0 standard drinks    Types: 2 Standard drinks or equivalent per week    Comment: OCCAS  . Drug use: No    Home Medications Prior to Admission medications   Medication Sig Start Date End Date Taking? Authorizing Provider  acetaminophen (TYLENOL) 500 MG tablet Take 500 mg by mouth every 6 (six) hours as needed for mild pain or moderate pain.   Yes [provider]  albuterol (PROVENTIL) (2.5 MG/3ML) 0.083% nebulizer solution Use every 4 hours with Ipratropium Patient taking differently: Take 2.5 mg by nebulization every 4 (four) hours as needed for wheezing or shortness of breath. 03/12/20  Yes Collene Gobble, MD  albuterol (VENTOLIN HFA) 108 (90 Base) MCG/ACT inhaler Inhale 2 puffs by mouth every 6 hours as needed for wheezing or shortness of breath. Patient taking differently: Inhale 2 puffs into the lungs every 6 (six) hours as needed for wheezing or shortness of breath. 05/16/19  Yes Byrum, Rose Fillers, MD  BREZTRI AEROSPHERE 160-9-4.8 MCG/ACT AERO INHALE 2 PUFFS TWICE DAILY 05/09/20  Yes Collene Gobble, MD   cetirizine (ZYRTEC) 10 MG tablet Take 10 mg by mouth daily.   Yes [provider]  ELIQUIS 5 MG TABS tablet Take 1 tablet by mouth twice daily Patient taking differently: Take 5 mg by mouth 2 (two) times daily. 04/05/20  Yes Josue Hector, MD  escitalopram (LEXAPRO) 20 MG tablet Take 1 tablet by mouth once daily Patient taking differently: Take 20 mg by mouth at bedtime. 03/21/20  Yes Biagio Borg, MD  estradiol (ESTRACE) 1 MG tablet Take 1 mg by mouth daily. 12/18/18  Yes [provider]  EUTHYROX 25 MCG tablet TAKE 1 TABLET BY MOUTH ONCE DAILY BEFORE BREAKFAST. PATIENT NEEDS AN OFFICE VISIT. 05/22/20  Yes Biagio Borg, MD  furosemide (LASIX) 40 MG tablet Take 1 tablet by mouth once daily Patient taking differently: Take 40 mg by mouth daily. 02/12/20  Yes Biagio Borg, MD  guaiFENesin (MUCINEX) 600 MG 12 hr tablet Take 600 mg by mouth 2 (two) times daily as needed for cough.   Yes [provider]  ipratropium (ATROVENT) 0.02 % nebulizer solution Take 2.5 mLs (0.5 mg total) by nebulization 4 (four) times daily. Patient taking differently: Take 0.5 mg by nebulization every 4 (four) hours as needed for wheezing or shortness of breath. 03/06/20  Yes Collene Gobble, MD  irbesartan (AVAPRO) 150 MG tablet Take 1 tablet by mouth once daily Patient taking differently: Take 150 mg by mouth daily. 03/21/20  Yes Biagio Borg, MD  LORazepam (ATIVAN) 1 MG tablet Take 1 tablet by mouth twice daily as needed for anxiety Patient taking differently: Take 1 mg by mouth 2 (two) times daily. 04/15/20  Yes Biagio Borg, MD  Magnesium Oxide 400 MG CAPS 1 tab by mouth daily 06/12/20  Yes Biagio Borg, MD  metFORMIN (GLUCOPHAGE-XR) 500 MG 24 hr tablet Take 2 tablets (1,000 mg total) by mouth daily with breakfast. 09/30/19  Yes Biagio Borg, MD  metoprolol tartrate (LOPRESSOR) 100 MG tablet Take 1 tablet (100 mg total) by mouth 2 (two) times daily. 11/09/19 02/07/20 Yes Josue Hector, MD   metoprolol tartrate (LOPRESSOR) 100 MG tablet Take 100 mg by mouth 2 (two) times daily.   Yes [provider]  pantoprazole (PROTONIX) 40 MG tablet Take 1 tablet by mouth once daily 06/03/20  Yes Biagio Borg, MD  potassium chloride SA (KLOR-CON) 20 MEQ tablet Take  1 tablet (20 mEq total) by mouth daily. 05/27/20  Yes Biagio Borg, MD  pravastatin (PRAVACHOL) 40 MG tablet Take 1 tablet by mouth once daily Patient taking differently: Take 40 mg by mouth daily. 05/10/20  Yes Biagio Borg, MD  Blood Glucose Monitoring Suppl (FREESTYLE LITE) DEVI Use as directed daily E11.9 09/26/18   Biagio Borg, MD  diphenoxylate-atropine (LOMOTIL) 2.5-0.025 MG tablet Take 1 tablet by mouth 4 (four) times daily as needed for diarrhea or loose stools. 03/23/18   Biagio Borg, MD  glucose blood (FREESTYLE LITE) test strip Use as instructed once daily E11.9 09/26/18   Biagio Borg, MD  Lancets MISC Use as directed once daily E11.9 09/26/18   Biagio Borg, MD  tiZANidine (ZANAFLEX) 2 MG tablet Take 1 tablet (2 mg total) by mouth every 6 (six) hours as needed for muscle spasms. 06/11/20   Biagio Borg, MD    Allergies    Tramadol hcl, Codeine, Tape, and Vicodin [hydrocodone-acetaminophen]  Review of Systems   Review of Systems  Constitutional: Negative for chills and fever.  HENT: Negative for ear pain and sore throat.   Eyes: Negative for pain and visual disturbance.  Respiratory: Positive for cough and shortness of breath. Negative for wheezing.   Cardiovascular: Negative for chest pain and palpitations.  Gastrointestinal: Negative for abdominal pain, diarrhea, nausea and vomiting.  Genitourinary: Negative for dysuria and hematuria.  Musculoskeletal: Negative for arthralgias and back pain.  Skin: Negative for color change and rash.  Neurological: Negative for seizures and syncope.  All other systems reviewed and are negative.   Physical Exam Updated Vital Signs BP 101/79   Pulse (!) 110   Temp  98.7 F (37.1 C)   Resp 13   Ht 5\' 2"  (1.575 m)   SpO2 97%   BMI 47.77 kg/m   Physical Exam Vitals and nursing note reviewed.  Constitutional:      General: She is not in acute distress.    Appearance: Normal appearance. She is well-developed and well-nourished. She is obese. She is ill-appearing. She is not diaphoretic.  HENT:     Head: Normocephalic and atraumatic.     Right Ear: External ear normal.     Left Ear: External ear normal.     Nose: Nose normal.  Eyes:     General: No scleral icterus.       Right eye: No discharge.        Left eye: No discharge.     Conjunctiva/sclera: Conjunctivae normal.  Cardiovascular:     Rate and Rhythm: Tachycardia present. Rhythm irregularly irregular.     Pulses:          Radial pulses are 2+ on the right side and 2+ on the left side.     Heart sounds: No murmur heard.   Pulmonary:     Effort: Pulmonary effort is normal. No respiratory distress.     Breath sounds: Normal breath sounds. No wheezing, rhonchi or rales.  Abdominal:     General: Abdomen is flat. There is no distension.     Palpations: Abdomen is soft.     Tenderness: There is no abdominal tenderness. There is no guarding or rebound.  Musculoskeletal:     Cervical back: Neck supple.     Right lower leg: 1+ Pitting Edema present.     Left lower leg: 1+ Pitting Edema present.  Skin:    General: Skin is warm and dry.  Findings: No rash.  Neurological:     General: No focal deficit present.     Mental Status: She is alert and oriented to person, place, and time.     Sensory: No sensory deficit.     Motor: No weakness.  Psychiatric:        Mood and Affect: Mood and affect and mood normal.        Behavior: Behavior normal.     ED Results / Procedures / Treatments   Labs (all labs ordered are listed, but only abnormal results are displayed) Labs Reviewed  COMPREHENSIVE METABOLIC PANEL - Abnormal; Notable for the following components:      Result Value    Chloride 95 (*)    Glucose, Bld 171 (*)    Creatinine, Ser 1.31 (*)    Calcium 6.7 (*)    Albumin 2.4 (*)    GFR, Estimated 45 (*)    All other components within normal limits  MAGNESIUM - Abnormal; Notable for the following components:   Magnesium 0.6 (*)    All other components within normal limits  BRAIN NATRIURETIC PEPTIDE - Abnormal; Notable for the following components:   B Natriuretic Peptide 199.9 (*)    All other components within normal limits  GLUCOSE, CAPILLARY - Abnormal; Notable for the following components:   Glucose-Capillary 121 (*)    All other components within normal limits  CBG MONITORING, ED - Abnormal; Notable for the following components:   Glucose-Capillary 150 (*)    All other components within normal limits  CBG MONITORING, ED - Abnormal; Notable for the following components:   Glucose-Capillary 128 (*)    All other components within normal limits  CBC  PHOSPHORUS  CALCIUM, IONIZED  BASIC METABOLIC PANEL  CBC  TSH  MAGNESIUM  TROPONIN I (HIGH SENSITIVITY)  TROPONIN I (HIGH SENSITIVITY)    EKG EKG Interpretation  Date/Time:  Thursday June 13 2020 17:44:16 EST Ventricular Rate:  128 PR Interval:    QRS Duration: 159 QT Interval:  340 QTC Calculation: 471 R Axis:   23 Text Interpretation: Atrial fibrillation Paired ventricular premature complexes Nonspecific intraventricular conduction delay Probable inferior infarct, age indeterminate Artifact in lead(s) I II III aVR aVL aVF V5 Confirmed by Madalyn Rob [phone]) on 06/13/2020 5:47:04 PM   Radiology DG Chest Portable 1 View  Result Date: 06/13/2020 CLINICAL DATA:  Hypomagnesemia, short of breath EXAM: PORTABLE CHEST 1 VIEW COMPARISON:  06/03/2020 FINDINGS: The heart size and mediastinal contours are within normal limits. Both lungs are clear. The visualized skeletal structures are unremarkable. IMPRESSION: No active disease. Electronically Signed   By: Randa Ngo M.D.   On: 06/13/2020  18:06    Procedures Procedures  Medications Ordered in ED Medications  irbesartan (AVAPRO) tablet 150 mg (has no administration in time range)  pravastatin (PRAVACHOL) tablet 40 mg (has no administration in time range)  escitalopram (LEXAPRO) tablet 20 mg (20 mg Oral Given 06/13/20 2248)  LORazepam (ATIVAN) tablet 1 mg (1 mg Oral Given 06/13/20 2248)  pantoprazole (PROTONIX) EC tablet 40 mg (has no administration in time range)  apixaban (ELIQUIS) tablet 5 mg (5 mg Oral Given 06/13/20 2248)  tiZANidine (ZANAFLEX) tablet 2 mg (has no administration in time range)  omega-3 acid ethyl esters (LOVAZA) capsule 1 g (has no administration in time range)  albuterol (VENTOLIN HFA) 108 (90 Base) MCG/ACT inhaler 2 puff (has no administration in time range)  levothyroxine (SYNTHROID) tablet 25 mcg (has no administration in time range)  insulin  aspart (novoLOG) injection 0-9 Units (has no administration in time range)  acetaminophen (TYLENOL) tablet 650 mg (has no administration in time range)    Or  acetaminophen (TYLENOL) suppository 650 mg (has no administration in time range)  magnesium oxide (MAG-OX) tablet 400 mg (has no administration in time range)  metoprolol tartrate (LOPRESSOR) tablet 100 mg (has no administration in time range)  fluticasone furoate-vilanterol (BREO ELLIPTA) 200-25 MCG/INH 1 puff (has no administration in time range)  umeclidinium bromide (INCRUSE ELLIPTA) 62.5 MCG/INH 1 puff (has no administration in time range)  metoprolol tartrate (LOPRESSOR) tablet 100 mg (100 mg Oral Given 06/13/20 1738)  magnesium sulfate IVPB 4 g 100 mL (0 g Intravenous Stopped 06/13/20 2217)    ED Course  I have reviewed the triage vital signs and the nursing notes.  Pertinent labs & imaging results that were available during my care of the patient were reviewed by me and considered in my medical decision making (see chart for details).    MDM Rules/Calculators/A&P                           Patient is a 72yoF with history and physical as described above who presents to the ED for hypomagnesemia and Afib with RVR. VS notable for tachycardia to 130s, otherwise HDS and satting well on RA with no respiratory distress. Mentating appropriate with GCS 15. Initial workup notable for Mg 0.6, BNP mildly elevated, Cr 1.31, and Ca 6.7. Ionized Ca and Phos pending. ECG with significant artifact and Afib with RVR but no QTc changes. Home-dose metoprolol given with improvement of HR to 100s. 4g IV Mg given in ED. Patient warrants admission for further magnesium repletion and evaluation for possible underlying etiology. Discussed patient with medicine who will admit. Patient remained HDS with no further acute events during ED course. Patient in stable condition at time of admission.  Final Clinical Impression(s) / ED Diagnoses Final diagnoses:  Atrial fibrillation, unspecified type ([place])  Hypomagnesemia    Rx / DC Orders ED Discharge Orders    None       Christy Gentles, MD 06/14/20 Laureen Abrahams    Lucrezia Starch, MD 06/14/20 2221

## 2020-06-13 NOTE — ED Triage Notes (Signed)
Pt sent over by her mD for a mag gtt , pt has known low mag levels for about 3 weeks but the lowest 2 days ago , pt prescribed po Mag by her MD

## 2020-06-13 NOTE — ED Notes (Signed)
RN attempted to call report x1 

## 2020-06-13 NOTE — Telephone Encounter (Signed)
Pt noted to be in ED

## 2020-06-13 NOTE — H&P (Signed)
History and Physical    Andrea Perry:[id number] DOB: 1950-08-10 DOA: 06/13/2020  PCP: Biagio Borg, MD  Patient coming from: Home.  Chief Complaint: Low magnesium levels.  HPI: Andrea Perry is a 70 y.o. female with history of chronic diastolic CHF, A. fib, COPD, diabetes mellitus, sleep apnea who was recently admitted and discharged after being admitted for Covid infection and CHF had followed up with patient's primary care physician and labs are drawn which showed low magnesium levels and was advised to come to the ER.  Denies any chest pain or new shortness of breath.  Patient had diarrhea during last admission but patient states since then her diarrhea has stopped.  ED Course: In the ER patient initially was in A. fib with RVR which improved after patient was given her home dose of metoprolol.  Patient labs show creatinine of 1.2 with magnesium of 0.5.  Blood glucose was 200.  Patient admitted for further management of severe hypomagnesemia.  EKG shows A. fib with RVR.  Chest x-ray does not show anything acute.  Review of Systems: As per HPI, rest all negative.   Past Medical History:  Diagnosis Date  . ALLERGIC RHINITIS 08/16/2008  . ANXIETY 08/16/2008  . Arthritis    hands, knees, lower back  . ASTHMA 08/16/2008  . Asthma    BRONCHITIS     DR. Lamonte Sakai    . Bladder leak   . Cancer ([place])    MELANOMA      . Chronic lower back pain    problems with disc L2-5  . COPD 08/16/2008  . DEPRESSION 08/16/2008  . Diabetes mellitus without complication ([place])   . Dysrhythmia    hx AF  . Excessive daytime sleepiness 06/19/2015  . GENITAL HERPES 12/03/2006  . GERD (gastroesophageal reflux disease)   . H/O hiatal hernia   . HEMATOCHEZIA 06/20/2009  . Hemorrhoids   . History of cardioversion 10/30/14  . History of kidney stones   . HYPERLIPIDEMIA 08/16/2008  . HYPERTENSION 12/03/2006  . Hypertension   . Impaired glucose tolerance 09/21/2010  . Overactive bladder 10/20/2015  . Pneumonia    as  a child  . Pulmonary HTN ([place]) 06/19/2015  . Shortness of breath dyspnea   . Sleep apnea    MILD NO CPAP ORDERED  . Snoring 06/19/2015    Past Surgical History:  Procedure Laterality Date  . ABDOMINAL HYSTERECTOMY    . APPENDECTOMY    . CARDIOVERSION N/A 03/20/2014   Procedure: CARDIOVERSION;  Surgeon: Josue Hector, MD;  Location: [facility] ENDOSCOPY;  Service: Cardiovascular;  Laterality: N/A;  . CARDIOVERSION N/A 10/30/2014   Procedure: CARDIOVERSION;  Surgeon: Josue Hector, MD;  Location: [facility] ENDOSCOPY;  Service: Cardiovascular;  Laterality: N/A;  . CARDIOVERSION N/A 04/03/2019   Procedure: CARDIOVERSION;  Surgeon: Dorothy Spark, MD;  Location: [facility] ENDOSCOPY;  Service: Cardiovascular;  Laterality: N/A;  . CARPAL TUNNEL RELEASE     Right + LEFT  . CESAREAN SECTION     x 1   . COLONOSCOPY  2004  . CYST EXCISION     RT ARM   . CYSTOCELE REPAIR N/A 10/25/2012   Procedure: ANTERIOR REPAIR (CYSTOCELE);  Surgeon: Gus Height, MD;  Location: [facility] [facility];  Service: Gynecology;  Laterality: N/A;  . HEEL SPUR SURGERY Bilateral   . KNEE SURGERY     Right + LEFT  . LUMBAR LAMINECTOMY/DECOMPRESSION MICRODISCECTOMY N/A 03/19/2016   Procedure: Laminectomy and Foraminotomy - Lumbar three-four - Lumbar four-five;  Surgeon: Eustace Moore, MD;  Location: [place];  Service: Neurosurgery;  Laterality: N/A;  . RADIOLOGY WITH ANESTHESIA Right 11/01/2014   Procedure: MRI RIGHT FOREARM;  Surgeon: Medication Radiologist, MD;  Location: [place];  Service: Radiology;  Laterality: Right;  . RADIOLOGY WITH ANESTHESIA Right 08/29/2015   Procedure: MRI - RIGHT FOREARM;  Surgeon: Medication Radiologist, MD;  Location: [place];  Service: Radiology;  Laterality: Right;  . RADIOLOGY WITH ANESTHESIA N/A 12/05/2015   Procedure: MRI SPINE WITHOUT;  Surgeon: Medication Radiologist, MD;  Location: [place];  Service: Radiology;  Laterality: N/A;  . SKIN CANCER EXCISION     BILAT SHOULDERS  . SPLIT NIGHT STUDY  09/04/2015  . TONSILLECTOMY  AND ADENOIDECTOMY       reports that she quit smoking about 17 years ago. Her smoking use included cigarettes. She has a 45.00 pack-year smoking history. She has never used smokeless tobacco. She reports current alcohol use of about 2.0 standard drinks of alcohol per week. She reports that she does not use drugs.  Allergies  Allergen Reactions  . Tramadol Hcl Nausea And Vomiting and Other (See Comments)     mouth dryness, headache  . Codeine Nausea And Vomiting and Nausea Only  . Tape Rash    Plastic tape, bandaids and ekg leads, causes redness and rash  . Vicodin [Hydrocodone-Acetaminophen] Nausea And Vomiting    Family History  Problem Relation Age of Onset  . Diabetes Mother   . Hyperlipidemia Mother   . Hypertension Mother   . Colon polyps Mother   . Stroke Father   . Colon cancer Neg Hx     Prior to Admission medications   Medication Sig Start Date End Date Taking? Authorizing Provider  albuterol (PROVENTIL) (2.5 MG/3ML) 0.083% nebulizer solution Use every 4 hours with Ipratropium Patient taking differently: Take 2.5 mg by nebulization every 4 (four) hours as needed for wheezing or shortness of breath. 03/12/20   Collene Gobble, MD  albuterol (VENTOLIN HFA) 108 (90 Base) MCG/ACT inhaler Inhale 2 puffs by mouth every 6 hours as needed for wheezing or shortness of breath. Patient taking differently: Inhale 2 puffs into the lungs every 6 (six) hours as needed for wheezing or shortness of breath. 05/16/19   Collene Gobble, MD  Blood Glucose Monitoring Suppl (FREESTYLE LITE) DEVI Use as directed daily E11.9 09/26/18   Biagio Borg, MD  BREZTRI AEROSPHERE 160-9-4.8 MCG/ACT AERO INHALE 2 PUFFS TWICE DAILY 05/09/20   Collene Gobble, MD  Budeson-Glycopyrrol-Formoterol (BREZTRI AEROSPHERE) 160-9-4.8 MCG/ACT AERO Inhale 2 puffs into the lungs in the morning, at noon, and at bedtime.    [provider]  cetirizine (ZYRTEC) 10 MG tablet Take 10 mg by mouth daily.    [provider]  diphenoxylate-atropine (LOMOTIL) 2.5-0.025 MG tablet Take 1 tablet by mouth 4 (four) times daily as needed for diarrhea or loose stools. 03/23/18   Biagio Borg, MD  ELIQUIS 5 MG TABS tablet Take 1 tablet by mouth twice daily Patient taking differently: Take 5 mg by mouth 2 (two) times daily. 04/05/20   Josue Hector, MD  escitalopram (LEXAPRO) 20 MG tablet Take 1 tablet by mouth once daily Patient taking differently: Take 20 mg by mouth at bedtime. 03/21/20   Biagio Borg, MD  estradiol (ESTRACE) 1 MG tablet Take 1 mg by mouth daily. 12/18/18   [provider]  EUTHYROX 25 MCG tablet TAKE 1 TABLET BY MOUTH ONCE DAILY BEFORE BREAKFAST. PATIENT NEEDS  AN OFFICE VISIT. 05/22/20   Biagio Borg, MD  furosemide (LASIX) 40 MG tablet Take 1 tablet by mouth once daily Patient taking differently: Take 40 mg by mouth daily. 02/12/20   Biagio Borg, MD  glucose blood (FREESTYLE LITE) test strip Use as instructed once daily E11.9 09/26/18   Biagio Borg, MD  guaiFENesin (MUCINEX) 600 MG 12 hr tablet Take 1 tablet (600 mg total) by mouth 2 (two) times daily as needed for cough or to loosen phlegm. 01/18/17   Biagio Borg, MD  ipratropium (ATROVENT) 0.02 % nebulizer solution Take 2.5 mLs (0.5 mg total) by nebulization 4 (four) times daily. Patient taking differently: Take 0.5 mg by nebulization every 4 (four) hours as needed for wheezing or shortness of breath. 03/06/20   Collene Gobble, MD  irbesartan (AVAPRO) 150 MG tablet Take 1 tablet by mouth once daily Patient taking differently: Take 150 mg by mouth daily. 03/21/20   Biagio Borg, MD  Lancets MISC Use as directed once daily E11.9 09/26/18   Biagio Borg, MD  LORazepam (ATIVAN) 1 MG tablet Take 1 tablet by mouth twice daily as needed for anxiety Patient taking differently: Take 1 mg by mouth 2 (two) times daily. 04/15/20   Biagio Borg, MD  Magnesium Oxide 400 MG CAPS 1 tab by mouth daily 06/12/20   Biagio Borg, MD  metFORMIN  (GLUCOPHAGE-XR) 500 MG 24 hr tablet Take 2 tablets (1,000 mg total) by mouth daily with breakfast. 09/30/19   Biagio Borg, MD  metoprolol tartrate (LOPRESSOR) 100 MG tablet Take 1 tablet (100 mg total) by mouth 2 (two) times daily. 11/09/19 02/07/20  Josue Hector, MD  Omega-3 Fatty Acids (FISH OIL) 1200 MG CAPS Take 1,200 mg by mouth daily.    [provider]  pantoprazole (PROTONIX) 40 MG tablet Take 1 tablet by mouth once daily 06/03/20   Biagio Borg, MD  potassium chloride SA (KLOR-CON) 20 MEQ tablet Take 1 tablet (20 mEq total) by mouth daily. 05/27/20   Biagio Borg, MD  pravastatin (PRAVACHOL) 40 MG tablet Take 1 tablet by mouth once daily Patient taking differently: Take 40 mg by mouth daily. 05/10/20   Biagio Borg, MD  promethazine-dextromethorphan (PROMETHAZINE-DM) 6.25-15 MG/5ML syrup Take 5 mLs by mouth 4 (four) times daily as needed for cough. Patient not taking: Reported on 06/11/2020 05/09/20   Hoyt Koch, MD  tiZANidine (ZANAFLEX) 2 MG tablet Take 1 tablet (2 mg total) by mouth every 6 (six) hours as needed for muscle spasms. 06/11/20   Biagio Borg, MD  vitamin E 400 UNIT capsule Take 400 Units by mouth daily.    [provider]    Physical Exam: Constitutional: Moderately built and nourished. Vitals:   06/13/20 1745 06/13/20 1800 06/13/20 1900 06/13/20 1915  BP: (!) 136/109  118/76   Pulse: (!) 125 (!) 124 (!) 124 (!) 106  Resp: 12 (!) 30 10 12   Temp:      TempSrc:      SpO2: 97% 99% 97% 97%   Eyes: Anicteric no pallor. ENMT: No discharge from the ears eyes nose or mouth. Neck: No mass felt.  No neck rigidity. Respiratory: No rhonchi or crepitations. Cardiovascular: S1-S2 heard. Abdomen: Soft nontender bowel sounds present. Musculoskeletal: No edema. Skin: No rash. Neurologic: Alert awake oriented to time place and person.  Moves all extremities. Psychiatric: Appears normal.  Normal affect.   Labs on Admission: I have personally  reviewed following labs and imaging studies  CBC: Recent Labs  Lab 06/11/20 1421 06/13/20 1326  WBC 8.6 10.0  NEUTROABS 6.4  --   HGB 12.4 12.4  HCT 36.5 36.9  MCV 91.4 92.9  PLT 205.0 601   Basic Metabolic Panel: Recent Labs  Lab 06/11/20 1421 06/13/20 1326 06/13/20 1806 06/13/20 1941  NA 138 136  --   --   K 4.6 4.5  --   --   CL 95* 95*  --   --   CO2 27 26  --   --   GLUCOSE 200* 171*  --   --   BUN 16 12  --   --   CREATININE 1.25* 1.31*  --   --   CALCIUM 7.1* 6.7*  --   --   MG 0.5*  --  0.6*  --   PHOS  --   --   --  2.5   GFR: Estimated Creatinine Clearance: 51 mL/min (A) (by C-G formula based on SCr of 1.31 mg/dL (H)). Liver Function Tests: Recent Labs  Lab 06/11/20 1421 06/13/20 1326  AST 14 18  ALT 13 16  ALKPHOS 95 94  BILITOT 1.1 1.2  PROT 6.1 6.7  ALBUMIN 3.0* 2.4*   No results for input(s): LIPASE, AMYLASE in the last 168 hours. No results for input(s): AMMONIA in the last 168 hours. Coagulation Profile: No results for input(s): INR, PROTIME in the last 168 hours. Cardiac Enzymes: No results for input(s): CKTOTAL, CKMB, CKMBINDEX, TROPONINI in the last 168 hours. BNP (last 3 results) No results for input(s): PROBNP in the last 8760 hours. HbA1C: No results for input(s): HGBA1C in the last 72 hours. CBG: Recent Labs  Lab 06/13/20 1649  GLUCAP 150*   Lipid Profile: No results for input(s): CHOL, HDL, LDLCALC, TRIG, CHOLHDL, LDLDIRECT in the last 72 hours. Thyroid Function Tests: No results for input(s): TSH, T4TOTAL, FREET4, T3FREE, THYROIDAB in the last 72 hours. Anemia Panel: No results for input(s): VITAMINB12, FOLATE, FERRITIN, TIBC, IRON, RETICCTPCT in the last 72 hours. Urine analysis:    Component Value Date/Time   COLORURINE YELLOW 05/23/2020 2342   APPEARANCEUR CLOUDY (A) 05/23/2020 2342   LABSPEC 1.013 05/23/2020 2342   PHURINE 8.0 05/23/2020 2342   GLUCOSEU NEGATIVE 05/23/2020 2342   GLUCOSEU NEGATIVE 03/17/2017 [place] 05/23/2020 2342   BILIRUBINUR NEGATIVE 05/23/2020 [place] 05/23/2020 2342   PROTEINUR NEGATIVE 05/23/2020 2342   UROBILINOGEN 0.2 03/17/2017 1525   NITRITE NEGATIVE 05/23/2020 2342   LEUKOCYTESUR MODERATE (A) 05/23/2020 2342   Sepsis Labs: @LABRCNTIP (procalcitonin:4,lacticidven:4) )No results found for this or any previous visit (from the past 240 hour(s)).   Radiological Exams on Admission: DG Chest Portable 1 View  Result Date: 06/13/2020 CLINICAL DATA:  Hypomagnesemia, short of breath EXAM: PORTABLE CHEST 1 VIEW COMPARISON:  06/03/2020 FINDINGS: The heart size and mediastinal contours are within normal limits. Both lungs are clear. The visualized skeletal structures are unremarkable. IMPRESSION: No active disease. Electronically Signed   By: Randa Ngo M.D.   On: 06/13/2020 18:06    EKG: Independently reviewed.  A. fib with RVR.  Assessment/Plan Principal Problem:   Hypomagnesemia Active Problems:   Hypothyroidism   Essential hypertension   COPD (chronic obstructive pulmonary disease) (HCC)   A-fib (HCC)   Pulmonary HTN ([place])    1. Severe hypomagnesemia -cause not clear patient did have some diarrhea during last admission which has resolved.  At this time will  hold off patient's Lasix and replace magnesium.  4 g was ordered by the ER physician through IV.  Patient is also on magnesium oxide. 2. Acute renal failure with creatinine worsening from 0.9 on February 5 is presently around 1.3.  Will hold Lasix and closely monitor.  If creatinine worsen may need IV fluids. 3. Chronic diastolic CHF presently holding Lasix due to severe hypomagnesemia and also worsening renal function. 4. A. fib with RVR on presentation in the ER improved with oral metoprolol.  We will continue oral metoprolol and Eliquis. 5. COPD not actively wheezing. 6. Patient admitted for respiratory failure secondary to Covid infection and CHF. 7. Hypothyroidism on Synthroid  check TSH.   DVT prophylaxis: Apixaban. Code Status: Full code. Family Communication: Discussed with patient. Disposition Plan: Home. Consults called: None. Admission status: Observation.   Rise Patience MD [facility] Pager [phone].  If 7PM-7AM, please contact night-coverage [email or web address] Password [facility]  06/13/2020, 8:59 PM

## 2020-06-13 NOTE — ED Notes (Signed)
Pt provided 300cc ice water. Ok per [employer] [facility]

## 2020-06-13 NOTE — ED Notes (Signed)
Nightly medications have not been given bc they are unverified

## 2020-06-13 NOTE — ED Notes (Signed)
RN attempted report x2 

## 2020-06-13 NOTE — Progress Notes (Signed)
Received report from ED RN,Tori. Room ready for patient.

## 2020-06-14 ENCOUNTER — Telehealth: Payer: Self-pay | Admitting: Internal Medicine

## 2020-06-14 DIAGNOSIS — I10 Essential (primary) hypertension: Secondary | ICD-10-CM | POA: Diagnosis not present

## 2020-06-14 DIAGNOSIS — I4819 Other persistent atrial fibrillation: Secondary | ICD-10-CM | POA: Diagnosis not present

## 2020-06-14 DIAGNOSIS — J438 Other emphysema: Secondary | ICD-10-CM

## 2020-06-14 DIAGNOSIS — I272 Pulmonary hypertension, unspecified: Secondary | ICD-10-CM

## 2020-06-14 DIAGNOSIS — E039 Hypothyroidism, unspecified: Secondary | ICD-10-CM

## 2020-06-14 LAB — MAGNESIUM: Magnesium: 1.6 mg/dL — ABNORMAL LOW (ref 1.7–2.4)

## 2020-06-14 LAB — BASIC METABOLIC PANEL
Anion gap: 16 — ABNORMAL HIGH (ref 5–15)
BUN: 15 mg/dL (ref 8–23)
CO2: 24 mmol/L (ref 22–32)
Calcium: 6.5 mg/dL — ABNORMAL LOW (ref 8.9–10.3)
Chloride: 93 mmol/L — ABNORMAL LOW (ref 98–111)
Creatinine, Ser: 1.32 mg/dL — ABNORMAL HIGH (ref 0.44–1.00)
GFR, Estimated: 44 mL/min — ABNORMAL LOW (ref >60)
Glucose, Bld: 175 mg/dL — ABNORMAL HIGH (ref 70–99)
Potassium: 3.4 mmol/L — ABNORMAL LOW (ref 3.5–5.1)
Sodium: 133 mmol/L — ABNORMAL LOW (ref 135–145)

## 2020-06-14 LAB — CBC
HCT: 33 % — ABNORMAL LOW (ref 36.0–46.0)
Hemoglobin: 11.4 g/dL — ABNORMAL LOW (ref 12.0–15.0)
MCH: 31.1 pg (ref 26.0–34.0)
MCHC: 34.5 g/dL (ref 30.0–36.0)
MCV: 90.2 fL (ref 80.0–100.0)
Platelets: 224 10*3/uL (ref 150–400)
RBC: 3.66 MIL/uL — ABNORMAL LOW (ref 3.87–5.11)
RDW: 14.1 % (ref 11.5–15.5)
WBC: 5.7 10*3/uL (ref 4.0–10.5)
nRBC: 0 % (ref 0.0–0.2)

## 2020-06-14 LAB — GLUCOSE, CAPILLARY
Glucose-Capillary: 131 mg/dL — ABNORMAL HIGH (ref 70–99)
Glucose-Capillary: 211 mg/dL — ABNORMAL HIGH (ref 70–99)
Glucose-Capillary: 144 mg/dL — ABNORMAL HIGH (ref 70–99)

## 2020-06-14 LAB — TSH: TSH: 4.548 u[IU]/mL — ABNORMAL HIGH (ref 0.350–4.500)

## 2020-06-14 LAB — CALCIUM, IONIZED: Calcium, Ionized, Serum: 3.4 mg/dL — ABNORMAL LOW (ref 4.5–5.6)

## 2020-06-14 MED ORDER — POTASSIUM CHLORIDE CRYS ER 20 MEQ PO TBCR: 20.0000 meq | EXTENDED_RELEASE_TABLET | Freq: Every day | ORAL | 3 refills | Status: DC

## 2020-06-14 MED ORDER — MENTHOL 3 MG MT LOZG
1.0000 | LOZENGE | OROMUCOSAL | Status: DC | PRN
  Administered 2020-06-14: 3 mg via ORAL
  Filled 2020-06-14: qty 9

## 2020-06-14 MED ORDER — MAGNESIUM SULFATE 2 GM/50ML IV SOLN
2.0000 g | Freq: Once | INTRAVENOUS | Status: AC
  Administered 2020-06-14: 2 g via INTRAVENOUS
  Filled 2020-06-14: qty 50

## 2020-06-14 MED ORDER — FUROSEMIDE 40 MG PO TABS: 40.0000 mg | ORAL_TABLET | Freq: Every day | ORAL | Status: DC

## 2020-06-14 MED ORDER — MAGNESIUM OXIDE 400 MG PO CAPS: 1.0000 | ORAL_CAPSULE | Freq: Two times a day (BID) | ORAL | 1 refills | Status: DC

## 2020-06-14 MED ORDER — OSCAL 500/200 D-3 500-200 MG-UNIT PO TABS: 1.0000 | ORAL_TABLET | Freq: Two times a day (BID) | ORAL | 2 refills | Status: DC

## 2020-06-14 MED ORDER — MAGNESIUM OXIDE 400 (241.3 MG) MG PO TABS
400.0000 mg | ORAL_TABLET | Freq: Two times a day (BID) | ORAL | Status: DC
  Administered 2020-06-14: 400 mg via ORAL
  Filled 2020-06-14: qty 1

## 2020-06-14 MED ORDER — POTASSIUM CHLORIDE CRYS ER 20 MEQ PO TBCR
40.0000 meq | EXTENDED_RELEASE_TABLET | Freq: Once | ORAL | Status: AC
  Administered 2020-06-14: 40 meq via ORAL
  Filled 2020-06-14: qty 2

## 2020-06-14 NOTE — Telephone Encounter (Signed)
Ok to hold this week and resume next week as planned

## 2020-06-14 NOTE — Telephone Encounter (Signed)
[facility] w/ [facility] called to report that the patient missed a PT visit. She deferred visits to next week. Please advise

## 2020-06-14 NOTE — Discharge Instructions (Signed)

## 2020-06-14 NOTE — Progress Notes (Signed)
Per patient not able to get a ride until son gets off work after 5:00.

## 2020-06-14 NOTE — Clinical Social Work Note (Signed)
[facility] transport arranged (5:47 pm) to get patient home - [address] [place].   Adem Costlow Givens, MSW, LCSW Licensed Clinical Social Worker Clinical Social Work Department [employer] [phone].

## 2020-06-14 NOTE — Discharge Summary (Signed)
Physician Discharge Summary  [name] UKG:[id number] DOB: [birth year] DOA: 06/13/2020  PCP: Biagio Borg, MD  Admit date: 06/13/2020 Discharge date: 06/14/2020  Admitted From: Home  Discharge disposition: Home  Recommendations for Outpatient Follow-Up:   . Follow up with your primary care provider in one week.  . Check CBC, BMP, magnesium in the next visit  Discharge Diagnosis:   Principal Problem:   Hypomagnesemia Active Problems:   Hypothyroidism   Essential hypertension   COPD (chronic obstructive pulmonary disease) (HCC)   A-fib (HCC)   Pulmonary HTN ([place])   Discharge Condition: Improved.  Diet recommendation: Low sodium, heart healthy.  Wound care: None.  Code status: Full.   History of Present Illness:   Andrea Perry is a 70 y.o. female with history of chronic diastolic CHF, atrial fibrillation,, COPD, diabetes mellitus, sleep apnea who was recently admitted to hospital for Covid infection and CHF was brought into the hospital since she was noted to have low magnesium levels at the PCP office.  Patient had been having some diarrhea on the last admission and was started on diuretics for CHF.  In the ED patient was noted to be in atrial fibrillation with RVR which improved with metoprolol.  Initial magnesium level was 0.5. Blood glucose was 200.  Patient admitted for further management of severe hypomagnesemia.  EKG showed A. fib with RVR.  Chest x-ray did not show anything acute.  Hospital Course:   Following conditions were addressed during hospitalization as listed below,  Severe hypomagnesemia -unclear etiology but was on diuretics and had diarrhea in the last admission.  Aggressively replenished.  Patient will be given magnesium oxide and potassium orally on discharge.  Will need to check electrolytes in the next visit.  Possible mild acute kidney injury.  Creatinine around 1.3.  Encourage oral hydration  Chronic diastolic CHF  Hold Lasix for next  couple of days.  Continue to replenish electrolytes on discharge   Hypocalcemia.  Will prescribe calcium supplement on discharge.  No cramps today  A. fib with RVR on presentation in the ER, improved with oral metoprolol.  We will continue oral metoprolol and Eliquis.  COPD not actively wheezing.  Resume nebulizer and inhaler at regimen.  Hypothyroidism on Synthroid   Disposition.  At this time, patient is stable for disposition home with PCP follow-up.  Medical Consultants:    None.  Procedures:    None Subjective:   Today, patient was seen and examined at bedside.  Denies any cramps nausea vomiting.  Feels much better today.  Discharge Exam:   Vitals:   06/14/20 0944 06/14/20 1102  BP: (!) 114/50 92/62  Pulse: 94   Resp: 17   Temp: 98.4 F (36.9 C)   SpO2: 94%    Vitals:   06/14/20 0730 06/14/20 0811 06/14/20 0944 06/14/20 1102  BP: 130/77  (!) 114/50 92/62  Pulse: (!) 105  94   Resp:   17   Temp: 98.2 F (36.8 C)  98.4 F (36.9 C)   TempSrc:   Oral   SpO2: 92% 94% 94%   Height:       General: Alert awake, not in obvious distress HENT: pupils equally reacting to light,  No scleral pallor or icterus noted. Oral mucosa is moist.  Chest:  Clear breath sounds.  Diminished breath sounds bilaterally. No crackles or wheezes.  CVS: S1 &S2 heard. No murmur.  Regular rate and rhythm. Abdomen: Soft, nontender, nondistended.  Bowel sounds are heard.  Extremities: No cyanosis, clubbing or edema.  Peripheral pulses are palpable. Psych: Alert, awake and oriented, normal mood CNS:  No cranial nerve deficits.  Power equal in all extremities.   Skin: Warm and dry.  No rashes noted.  The results of significant diagnostics from this hospitalization (including imaging, microbiology, ancillary and laboratory) are listed below for reference.     Diagnostic Studies:   DG Chest Portable 1 View  Result Date: 06/13/2020 CLINICAL DATA:  Hypomagnesemia, short of breath EXAM:  PORTABLE CHEST 1 VIEW COMPARISON:  06/03/2020 FINDINGS: The heart size and mediastinal contours are within normal limits. Both lungs are clear. The visualized skeletal structures are unremarkable. IMPRESSION: No active disease. Electronically Signed   By: Randa Ngo M.D.   On: 06/13/2020 18:06     Labs:   Basic Metabolic Panel: Recent Labs  Lab 06/11/20 1421 06/13/20 1326 06/13/20 1806 06/13/20 1941 06/14/20 0248  NA 138 136  --   --  133*  K 4.6 4.5  --   --  3.4*  CL 95* 95*  --   --  93*  CO2 27 26  --   --  24  GLUCOSE 200* 171*  --   --  175*  BUN 16 12  --   --  15  CREATININE 1.25* 1.31*  --   --  1.32*  CALCIUM 7.1* 6.7*  --   --  6.5*  MG 0.5*  --  0.6*  --  1.6*  PHOS  --   --   --  2.5  --    GFR Estimated Creatinine Clearance: 50.6 mL/min (A) (by C-G formula based on SCr of 1.32 mg/dL (H)). Liver Function Tests: Recent Labs  Lab 06/11/20 1421 06/13/20 1326  AST 14 18  ALT 13 16  ALKPHOS 95 94  BILITOT 1.1 1.2  PROT 6.1 6.7  ALBUMIN 3.0* 2.4*   No results for input(s): LIPASE, AMYLASE in the last 168 hours. No results for input(s): AMMONIA in the last 168 hours. Coagulation profile No results for input(s): INR, PROTIME in the last 168 hours.  CBC: Recent Labs  Lab 06/11/20 1421 06/13/20 1326 06/14/20 0248  WBC 8.6 10.0 5.7  NEUTROABS 6.4  --   --   HGB 12.4 12.4 11.4*  HCT 36.5 36.9 33.0*  MCV 91.4 92.9 90.2  PLT 205.0 233 224   Cardiac Enzymes: No results for input(s): CKTOTAL, CKMB, CKMBINDEX, TROPONINI in the last 168 hours. BNP: Invalid input(s): POCBNP CBG: Recent Labs  Lab 06/13/20 1649 06/13/20 2200 06/13/20 2236 06/14/20 0633 06/14/20 1143  GLUCAP 150* 128* 121* 131* 211*   D-Dimer No results for input(s): DDIMER in the last 72 hours. Hgb A1c No results for input(s): HGBA1C in the last 72 hours. Lipid Profile No results for input(s): CHOL, HDL, LDLCALC, TRIG, CHOLHDL, LDLDIRECT in the last 72 hours. Thyroid function  studies Recent Labs    06/14/20 0248  TSH 4.548*   Anemia work up No results for input(s): VITAMINB12, FOLATE, FERRITIN, TIBC, IRON, RETICCTPCT in the last 72 hours. Microbiology No results found for this or any previous visit (from the past 240 hour(s)).   Discharge Instructions:   Discharge Instructions     Diet - low sodium heart healthy   Complete by: As directed    Diet Carb Modified   Complete by: As directed    Discharge instructions   Complete by: As directed    Follow-up with your primary care physician in 1 week.  Check CBC BMP magnesium levels at that time.  Continue magnesium and potassium supplements on discharge.  Take magnesium twice a day.  Start taking diuretic (lasix) on 06/16/2020.   Increase activity slowly   Complete by: As directed       Allergies as of 06/14/2020       Reactions   Tramadol Hcl Nausea And Vomiting, Other (See Comments)    mouth dryness, headache   Codeine Nausea And Vomiting, Nausea Only   Tape Rash   Plastic tape, bandaids and ekg leads, causes redness and rash   Vicodin [hydrocodone-acetaminophen] Nausea And Vomiting        Medication List     TAKE these medications    acetaminophen 500 MG tablet Commonly known as: TYLENOL Take 500 mg by mouth every 6 (six) hours as needed for mild pain or moderate pain.   albuterol 108 (90 Base) MCG/ACT inhaler Commonly known as: VENTOLIN HFA Inhale 2 puffs by mouth every 6 hours as needed for wheezing or shortness of breath. What changed:   how much to take  how to take this  when to take this  reasons to take this  additional instructions   albuterol (2.5 MG/3ML) 0.083% nebulizer solution Commonly known as: PROVENTIL Use every 4 hours with Ipratropium What changed:   how much to take  how to take this  when to take this  reasons to take this  additional instructions   Breztri Aerosphere 160-9-4.8 MCG/ACT Aero Generic drug: Budeson-Glycopyrrol-Formoterol INHALE  2 PUFFS TWICE DAILY   cetirizine 10 MG tablet Commonly known as: ZYRTEC Take 10 mg by mouth daily.   diphenoxylate-atropine 2.5-0.025 MG tablet Commonly known as: Lomotil Take 1 tablet by mouth 4 (four) times daily as needed for diarrhea or loose stools.   Eliquis 5 MG Tabs tablet Generic drug: apixaban Take 1 tablet by mouth twice daily What changed: how much to take   escitalopram 20 MG tablet Commonly known as: LEXAPRO Take 1 tablet by mouth once daily What changed: when to take this   estradiol 1 MG tablet Commonly known as: ESTRACE Take 1 mg by mouth daily.   Euthyrox 25 MCG tablet Generic drug: levothyroxine TAKE 1 TABLET BY MOUTH ONCE DAILY BEFORE BREAKFAST. PATIENT NEEDS AN OFFICE VISIT.   FreeStyle [employer] Use as directed daily E11.9   furosemide 40 MG tablet Commonly known as: LASIX Take 1 tablet (40 mg total) by mouth daily. Start taking on: June 16, 2020 What changed: These instructions start on June 16, 2020. If you are unsure what to do until then, ask your doctor or other care provider.   glucose blood test strip Commonly known as: FREESTYLE LITE Use as instructed once daily E11.9   guaiFENesin 600 MG 12 hr tablet Commonly known as: MUCINEX Take 600 mg by mouth 2 (two) times daily as needed for cough.   ipratropium 0.02 % nebulizer solution Commonly known as: ATROVENT Take 2.5 mLs (0.5 mg total) by nebulization 4 (four) times daily. What changed:   when to take this  reasons to take this   irbesartan 150 MG tablet Commonly known as: AVAPRO Take 1 tablet by mouth once daily   Lancets Misc Use as directed once daily E11.9   LORazepam 1 MG tablet Commonly known as: ATIVAN Take 1 tablet by mouth twice daily as needed for anxiety What changed:   when to take this  additional instructions   Magnesium Oxide 400 MG Caps Take 1 capsule (400 mg total)  by mouth 2 (two) times daily. 1 tab by mouth daily What changed:   how much to  take  how to take this  when to take this   metFORMIN 500 MG 24 hr tablet Commonly known as: GLUCOPHAGE-XR Take 2 tablets (1,000 mg total) by mouth daily with breakfast.   metoprolol tartrate 100 MG tablet Commonly known as: LOPRESSOR Take 100 mg by mouth 2 (two) times daily. What changed: Another medication with the same name was removed. Continue taking this medication, and follow the directions you see here.   Oscal 500/200 D-3 500-200 MG-UNIT tablet Generic drug: calcium-vitamin D Take 1 tablet by mouth 2 (two) times daily.   pantoprazole 40 MG tablet Commonly known as: PROTONIX Take 1 tablet by mouth once daily   potassium chloride SA 20 MEQ tablet Commonly known as: KLOR-CON Take 1 tablet (20 mEq total) by mouth daily.   pravastatin 40 MG tablet Commonly known as: PRAVACHOL Take 1 tablet by mouth once daily   tiZANidine 2 MG tablet Commonly known as: ZANAFLEX Take 1 tablet (2 mg total) by mouth every 6 (six) hours as needed for muscle spasms.         Time coordinating discharge: 39 minutes  Signed:  Jaiyla Granados  [facility] 06/14/2020, 1:22 PM

## 2020-06-14 NOTE — Progress Notes (Signed)
New Admission Note:   Arrival Method: Arrived from [facility] ED via stretcher Mental Orientation: Alert and oriented x4 Telemetry: Box #12 Assessment: Completed Skin: See doc flowsheet IV: NSL-Rt FA Pain: 0/10 Tubes: N/A Safety Measures: Safety Fall Prevention Plan has been discussed.  Admission: Completed 5MW Orientation: Patient has been oriented to the room, unit and staff.  Family: none at bedside  Orders have been reviewed and implemented. Will continue to monitor the patient. Call light has been placed within reach and bed alarm has been activated.   Cecely Rengel [employer], RN-BC Phone number: [phone]

## 2020-06-14 NOTE — Progress Notes (Signed)
DISCHARGE NOTE HOME Andrea Perry to be discharged Home per MD order. Discussed prescriptions and follow up appointments with the patient. Prescriptions given to patient; medication list explained in detail. Patient verbalized understanding.  Skin clean, dry and intact without evidence of skin break down, no evidence of skin tears noted. IV catheter discontinued intact. Site without signs and symptoms of complications. Dressing and pressure applied. Pt denies pain at the site currently. No complaints noted.  Patient free of lines, drains, and wounds.   An After Visit Summary (AVS) was printed and given to the patient. Patient escorted via wheelchair, and discharged home via private auto.  Berneta Levins, RN

## 2020-06-17 ENCOUNTER — Telehealth: Payer: Self-pay

## 2020-06-17 NOTE — Telephone Encounter (Signed)
Notified [facility] w/ [facility] of Dr. Jenny Reichmann message

## 2020-06-18 ENCOUNTER — Encounter: Payer: Self-pay | Admitting: Internal Medicine

## 2020-06-18 DIAGNOSIS — R252 Cramp and spasm: Secondary | ICD-10-CM | POA: Insufficient documentation

## 2020-06-18 NOTE — Assessment & Plan Note (Signed)
Lab Results  Component Value Date   HGBA1C 7.3 (H) 05/21/2020   Stable, pt to continue current medical treatment metformin  Current Outpatient Medications (Endocrine & Metabolic):  .  estradiol (ESTRACE) 1 MG tablet, Take 1 mg by mouth daily. .  EUTHYROX 25 MCG tablet, TAKE 1 TABLET BY MOUTH ONCE DAILY BEFORE BREAKFAST. PATIENT NEEDS AN OFFICE VISIT. Marland Kitchen  metFORMIN (GLUCOPHAGE-XR) 500 MG 24 hr tablet, Take 2 tablets (1,000 mg total) by mouth daily with breakfast.  Current Outpatient Medications (Cardiovascular):  .  irbesartan (AVAPRO) 150 MG tablet, Take 1 tablet by mouth once daily (Patient taking differently: Take 150 mg by mouth daily.) .  pravastatin (PRAVACHOL) 40 MG tablet, Take 1 tablet by mouth once daily (Patient taking differently: Take 40 mg by mouth daily.) .  furosemide (LASIX) 40 MG tablet, Take 1 tablet (40 mg total) by mouth daily. .  metoprolol tartrate (LOPRESSOR) 100 MG tablet, Take 100 mg by mouth 2 (two) times daily.  Current Outpatient Medications (Respiratory):  .  albuterol (PROVENTIL) (2.5 MG/3ML) 0.083% nebulizer solution, Use every 4 hours with Ipratropium (Patient taking differently: Take 2.5 mg by nebulization every 4 (four) hours as needed for wheezing or shortness of breath.) .  albuterol (VENTOLIN HFA) 108 (90 Base) MCG/ACT inhaler, Inhale 2 puffs by mouth every 6 hours as needed for wheezing or shortness of breath. (Patient taking differently: Inhale 2 puffs into the lungs every 6 (six) hours as needed for wheezing or shortness of breath.) .  BREZTRI AEROSPHERE 160-9-4.8 MCG/ACT AERO, INHALE 2 PUFFS TWICE DAILY .  cetirizine (ZYRTEC) 10 MG tablet, Take 10 mg by mouth daily. Marland Kitchen  ipratropium (ATROVENT) 0.02 % nebulizer solution, Take 2.5 mLs (0.5 mg total) by nebulization 4 (four) times daily. (Patient taking differently: Take 0.5 mg by nebulization every 4 (four) hours as needed for wheezing or shortness of breath.) .  guaiFENesin (MUCINEX) 600 MG 12 hr tablet,  Take 600 mg by mouth 2 (two) times daily as needed for cough.  Current Outpatient Medications (Analgesics):  .  acetaminophen (TYLENOL) 500 MG tablet, Take 500 mg by mouth every 6 (six) hours as needed for mild pain or moderate pain.  Current Outpatient Medications (Hematological):  Marland Kitchen  ELIQUIS 5 MG TABS tablet, Take 1 tablet by mouth twice daily (Patient taking differently: Take 5 mg by mouth 2 (two) times daily.)  Current Outpatient Medications (Other):  .  Blood Glucose Monitoring Suppl (FREESTYLE LITE) DEVI, Use as directed daily E11.9 .  diphenoxylate-atropine (LOMOTIL) 2.5-0.025 MG tablet, Take 1 tablet by mouth 4 (four) times daily as needed for diarrhea or loose stools. Marland Kitchen  escitalopram (LEXAPRO) 20 MG tablet, Take 1 tablet by mouth once daily (Patient taking differently: Take 20 mg by mouth at bedtime.) .  glucose blood (FREESTYLE LITE) test strip, Use as instructed once daily E11.9 .  Lancets MISC, Use as directed once daily E11.9 .  LORazepam (ATIVAN) 1 MG tablet, Take 1 tablet by mouth twice daily as needed for anxiety (Patient taking differently: Take 1 mg by mouth 2 (two) times daily.) .  pantoprazole (PROTONIX) 40 MG tablet, Take 1 tablet by mouth once daily .  tiZANidine (ZANAFLEX) 2 MG tablet, Take 1 tablet (2 mg total) by mouth every 6 (six) hours as needed for muscle spasms. .  calcium-vitamin D (OSCAL 500/200 D-3) 500-200 MG-UNIT tablet, Take 1 tablet by mouth 2 (two) times daily. .  Magnesium Oxide 400 MG CAPS, Take 1 capsule (400 mg total) by mouth 2 (  two) times daily. 1 tab by mouth daily .  potassium chloride SA (KLOR-CON) 20 MEQ tablet, Take 1 tablet (20 mEq total) by mouth daily.,

## 2020-06-18 NOTE — Assessment & Plan Note (Signed)
Stable volume, cont current med tx

## 2020-06-18 NOTE — Assessment & Plan Note (Signed)
For tizanidine prn

## 2020-06-18 NOTE — Assessment & Plan Note (Signed)
D/w pt - for booster at 6 mo

## 2020-06-18 NOTE — Assessment & Plan Note (Signed)
For fu lab,  to f/u any worsening symptoms or concerns  

## 2020-06-20 ENCOUNTER — Telehealth: Payer: Self-pay | Admitting: Internal Medicine

## 2020-06-20 NOTE — Telephone Encounter (Signed)
Deena from [facility] calling, states the patient has a bed bug infestation at this time and she is getting someone to come out on the 7th to treat them. They will not be doing HH this week, will go next week if issue is resolved.

## 2020-06-23 NOTE — Progress Notes (Unsigned)
Cardiology Office Note   Date:  06/26/2020   ID:  Andrea Perry, DOB [birth year], MRN [id number]  PCP:  Biagio Borg, MD  Cardiologist:  Dr. Johnsie Cancel, MD  Chief Complaint  Patient presents with  . Follow-up    History of Present Illness: Andrea Perry is a 70 y.o. female who presents for follow up, seen for Dr. Johnsie Cancel.   Andrea Perry has a hx of diastolic CHF, DM2, HTN, HLD and long standing symptomatic PAF on anticoagulation with no prior hx of CAD.   She underwent a stress test 2016 that was normal. Echocardiogram 05/24/19 eith LVEF at 55-60% with normal atria and RV with no valvular disease. She has undergone DCCVx2 in the past and failed flecainide therapy. She deferred hospitalization for Tikosyn and has previously stayed rate controlled on Eliquis. She had some issues with clinical aspiration in the past with coughing after eating. She was last seen by Dr. Johnsie Cancel 09/2019 and was doing well.   She was seen in the ED 06/13/20 with hypomagnesimia levels at her PCPs office. In the ED she had AF with RVR that improved with metoprolol. She reportedly had diarrhea prior to admission that was felt to be the etiology. Otherwise she was discharged one day later to home.   Today she states that she has been doing well from a CV standpoint. She was started on twice daily Mg supplementation. Plan to re-check labs today. She denies chest pain or palpitations. She states that since having COVID several months back and changing her eating habits, she has lost from 275lb to 252lb today. She states that she feels much better and her breathing is improved. She underwent a sleep study per Dr. Lamonte Sakai and was not found to have sleep apnea.   Past Medical History:  Diagnosis Date  . ALLERGIC RHINITIS 08/16/2008  . ANXIETY 08/16/2008  . Arthritis    hands, knees, lower back  . ASTHMA 08/16/2008  . Asthma    BRONCHITIS     DR. Lamonte Sakai    . Bladder leak   . Cancer ([place])    MELANOMA      . Chronic lower back  pain    problems with disc L2-5  . COPD 08/16/2008  . DEPRESSION 08/16/2008  . Diabetes mellitus without complication ([place])   . Dysrhythmia    hx AF  . Excessive daytime sleepiness 06/19/2015  . GENITAL HERPES 12/03/2006  . GERD (gastroesophageal reflux disease)   . H/O hiatal hernia   . HEMATOCHEZIA 06/20/2009  . Hemorrhoids   . History of cardioversion 10/30/14  . History of kidney stones   . HYPERLIPIDEMIA 08/16/2008  . HYPERTENSION 12/03/2006  . Hypertension   . Impaired glucose tolerance 09/21/2010  . Overactive bladder 10/20/2015  . Pneumonia    as a child  . Pulmonary HTN ([place]) 06/19/2015  . Shortness of breath dyspnea   . Sleep apnea    MILD NO CPAP ORDERED  . Snoring 06/19/2015    Past Surgical History:  Procedure Laterality Date  . ABDOMINAL HYSTERECTOMY    . APPENDECTOMY    . CARDIOVERSION N/A 03/20/2014   Procedure: CARDIOVERSION;  Surgeon: Josue Hector, MD;  Location: [facility] ENDOSCOPY;  Service: Cardiovascular;  Laterality: N/A;  . CARDIOVERSION N/A 10/30/2014   Procedure: CARDIOVERSION;  Surgeon: Josue Hector, MD;  Location: [facility] ENDOSCOPY;  Service: Cardiovascular;  Laterality: N/A;  . CARDIOVERSION N/A 04/03/2019   Procedure: CARDIOVERSION;  Surgeon: Dorothy Spark, MD;  Location:  [facility] ENDOSCOPY;  Service: Cardiovascular;  Laterality: N/A;  . CARPAL TUNNEL RELEASE     Right + LEFT  . CESAREAN SECTION     x 1   . COLONOSCOPY  2004  . CYST EXCISION     RT ARM   . CYSTOCELE REPAIR N/A 10/25/2012   Procedure: ANTERIOR REPAIR (CYSTOCELE);  Surgeon: Gus Height, MD;  Location: [facility];  Service: Gynecology;  Laterality: N/A;  . HEEL SPUR SURGERY Bilateral   . KNEE SURGERY     Right + LEFT  . LUMBAR LAMINECTOMY/DECOMPRESSION MICRODISCECTOMY N/A 03/19/2016   Procedure: Laminectomy and Foraminotomy - Lumbar three-four - Lumbar four-five;  Surgeon: Eustace Moore, MD;  Location: [place];  Service: Neurosurgery;  Laterality: N/A;  . RADIOLOGY WITH ANESTHESIA Right 11/01/2014    Procedure: MRI RIGHT FOREARM;  Surgeon: Medication Radiologist, MD;  Location: [place];  Service: Radiology;  Laterality: Right;  . RADIOLOGY WITH ANESTHESIA Right 08/29/2015   Procedure: MRI - RIGHT FOREARM;  Surgeon: Medication Radiologist, MD;  Location: [place];  Service: Radiology;  Laterality: Right;  . RADIOLOGY WITH ANESTHESIA N/A 12/05/2015   Procedure: MRI SPINE WITHOUT;  Surgeon: Medication Radiologist, MD;  Location: [place];  Service: Radiology;  Laterality: N/A;  . SKIN CANCER EXCISION     BILAT SHOULDERS  . SPLIT NIGHT STUDY  09/04/2015  . TONSILLECTOMY AND ADENOIDECTOMY       Current Outpatient Medications  Medication Sig Dispense Refill  . acetaminophen (TYLENOL) 500 MG tablet Take 500 mg by mouth every 6 (six) hours as needed for mild pain or moderate pain.    Marland Kitchen albuterol (PROVENTIL) (2.5 MG/3ML) 0.083% nebulizer solution Use every 4 hours with Ipratropium (Patient taking differently: Take 2.5 mg by nebulization every 4 (four) hours as needed for wheezing or shortness of breath.) 360 mL 12  . albuterol (VENTOLIN HFA) 108 (90 Base) MCG/ACT inhaler Inhale 2 puffs by mouth every 6 hours as needed for wheezing or shortness of breath. 18 g 5  . Blood Glucose Monitoring Suppl (FREESTYLE LITE) DEVI Use as directed daily E11.9 1 each 0  . BREZTRI AEROSPHERE 160-9-4.8 MCG/ACT AERO INHALE 2 PUFFS TWICE DAILY 11 g 1  . calcium-vitamin D (OSCAL 500/200 D-3) 500-200 MG-UNIT tablet Take 1 tablet by mouth 2 (two) times daily. 60 tablet 2  . cetirizine (ZYRTEC) 10 MG tablet Take 10 mg by mouth daily.    . diphenoxylate-atropine (LOMOTIL) 2.5-0.025 MG tablet Take 1 tablet by mouth 4 (four) times daily as needed for diarrhea or loose stools. 40 tablet 0  . ELIQUIS 5 MG TABS tablet Take 1 tablet by mouth twice daily 60 tablet 5  . escitalopram (LEXAPRO) 20 MG tablet Take 1 tablet by mouth once daily 90 tablet 1  . estradiol (ESTRACE) 1 MG tablet Take 1 mg by mouth daily.    Arna Medici 25 MCG tablet  TAKE ONE TABLET BY MOUTH ONCE DAILY BEFORE BREAKFAST. PATIENT NEEDS OFFICE VISIT. 30 tablet 0  . furosemide (LASIX) 40 MG tablet Take 1 tablet (40 mg total) by mouth daily.    Marland Kitchen glucose blood (FREESTYLE LITE) test strip Use as instructed once daily E11.9 100 each 12  . guaiFENesin (MUCINEX) 600 MG 12 hr tablet Take 600 mg by mouth 2 (two) times daily as needed for cough.    Marland Kitchen ipratropium (ATROVENT) 0.02 % nebulizer solution Take 2.5 mLs (0.5 mg total) by nebulization 4 (four) times daily. (Patient taking differently: Take 0.5 mg by nebulization every 4 (four)  hours as needed for wheezing or shortness of breath.) 300 mL 12  . irbesartan (AVAPRO) 150 MG tablet Take 1 tablet by mouth once daily 90 tablet 1  . Lancets MISC Use as directed once daily E11.9 100 each 11  . LORazepam (ATIVAN) 1 MG tablet Take 1 tablet by mouth twice daily as needed for anxiety 60 tablet 5  . Magnesium Oxide 400 MG CAPS Take 1 capsule (400 mg total) by mouth 2 (two) times daily. 1 tab by mouth daily 90 capsule 1  . metFORMIN (GLUCOPHAGE-XR) 500 MG 24 hr tablet Take 2 tablets (1,000 mg total) by mouth daily with breakfast. 180 tablet 3  . metoprolol tartrate (LOPRESSOR) 100 MG tablet Take 100 mg by mouth 2 (two) times daily.    . pantoprazole (PROTONIX) 40 MG tablet Take 1 tablet by mouth once daily 90 tablet 0  . potassium chloride SA (KLOR-CON) 20 MEQ tablet Take 1 tablet (20 mEq total) by mouth daily. 90 tablet 3  . pravastatin (PRAVACHOL) 40 MG tablet Take 1 tablet by mouth once daily 90 tablet 0  . tiZANidine (ZANAFLEX) 2 MG tablet Take 1 tablet (2 mg total) by mouth every 6 (six) hours as needed for muscle spasms. 60 tablet 2   No current facility-administered medications for this visit.    Allergies:   Tramadol hcl, Codeine, Tape, and Vicodin [hydrocodone-acetaminophen]    Social History:  The patient  reports that she quit smoking about 17 years ago. Her smoking use included cigarettes. She has a 45.00 pack-year  smoking history. She has never used smokeless tobacco. She reports current alcohol use of about 2.0 standard drinks of alcohol per week. She reports that she does not use drugs.   Family History:  The patient's family history includes Colon polyps in her mother; Diabetes in her mother; Hyperlipidemia in her mother; Hypertension in her mother; Stroke in her father.    ROS:  Please see the history of present illness.   Otherwise, review of systems are positive for none. All other systems are reviewed and negative.    PHYSICAL EXAM: VS:  BP 112/60   Pulse 61   Ht 5\' 2"  (1.575 m)   Wt 252 lb (114.3 kg)   SpO2 98%   BMI 46.09 kg/m  , BMI Body mass index is 46.09 kg/m.   General: Well developed, well nourished, NAD Lungs:Clear to ausculation bilaterally. No wheezes, rales, or rhonchi. Breathing is unlabored. Cardiovascular: No murmurs Extremities: 1+ BLE edema. Neuro: Alert and oriented. No focal deficits. No facial asymmetry. MAE spontaneously. Psych: Responds to questions appropriately with normal affect.     EKG:  EKG is not ordered today.  Recent Labs: 06/13/2020: ALT 16; B Natriuretic Peptide 199.9 06/14/2020: BUN 15; Creatinine, Ser 1.32; Hemoglobin 11.4; Magnesium 1.6; Platelets 224; Potassium 3.4; Sodium 133; TSH 4.548    Lipid Panel    Component Value Date/Time   CHOL 155 09/27/2019 1456   TRIG 163 (H) 05/21/2020 1146   HDL 38.20 (L) 09/27/2019 1456   CHOLHDL 4 09/27/2019 1456   VLDL 54.0 (H) 09/27/2019 1456   LDLCALC 87 03/23/2018 1521   LDLDIRECT 87.0 09/27/2019 1456     Wt Readings from Last 3 Encounters:  06/26/20 252 lb (114.3 kg)  06/03/20 261 lb 3.2 oz (118.5 kg)  05/25/20 264 lb 8.8 oz (120 kg)     Other studies Reviewed: Additional studies/ records that were reviewed today include:  Review of the above records demonstrates:   ECHOIMPRESSIONS2/2021  1. Left ventricular ejection fraction, by visual estimation, is 55 to  60%. The left ventricle has  normal function. There is no left ventricular  hypertrophy.  2. Left ventricular diastolic parameters are indeterminate.  3. Global right ventricle has normal systolic function.The right  ventricular size is normal.  4. Left atrial size was normal.  5. Right atrial size was normal.  6. Mild mitral annular calcification.  7. The mitral valve is normal in structure. No evidence of mitral valve  regurgitation.  8. The tricuspid valve is normal in structure.  9. The tricuspid valve is normal in structure. Tricuspid valve  regurgitation is not demonstrated.  10. The aortic valve was not well visualized. Aortic valve regurgitation  is not visualized. No evidence of aortic valve stenosis.  11. The pulmonic valve was not well visualized. Pulmonic valve  regurgitation is not visualized.  12. The inferior vena cava is dilated in size with <50% respiratory  variability, suggesting right atrial pressure of 15 mmHg.  13. TR signal is inadequate for assessing pulmonary artery systolic  pressure.    DCCV 05/2018 Indications:atrial fibrillation  ASSESSMENT AND PLAN:  1. Persistent Atrial Fibrillation: -Plan previously declined ablation and opted for rate control and AC with Eliquis -Rate controlled today at 61bpm -Continue metoprolol 100mg  BID  2. HTN: -Stable, 112/60 -Continue current regimen with no changes   3. Dysphagia: -Swallow study was negative  4. Probable diastolic dysfunction: -Echo performed during COVID hospitalization with stable LVEF at 50-55%   -Appears euvolemic on exam   5. HLD: -Continue statin therapy   6. Hypomagnesemia: -Will repeat labs today  -Continue with BID Mg supplementation     Current medicines are reviewed at length with the patient today.  The patient does not have concerns regarding medicines.  The following changes have been made:  no change  Labs/ tests ordered today include: CMET, Mg+  Orders Placed This Encounter   Procedures  . Comprehensive metabolic panel  . Magnesium   Disposition:   FU with Dr. Marlou Porch in 6 months  Signed, Kathyrn Drown, NP  06/26/2020 12:17 PM    [address], [place], [place]  [address] Phone: [phone]; Fax: [phone]

## 2020-06-24 ENCOUNTER — Other Ambulatory Visit: Payer: Self-pay | Admitting: Internal Medicine

## 2020-06-24 NOTE — Telephone Encounter (Signed)
Please refill as per office routine med refill policy (all routine meds refilled for 3 mo or monthly per pt preference up to one year from last visit, then month to month grace period for 3 mo, then further med refills will have to be denied)  

## 2020-06-26 ENCOUNTER — Other Ambulatory Visit: Payer: Self-pay

## 2020-06-26 ENCOUNTER — Encounter: Payer: Self-pay | Admitting: Cardiology

## 2020-06-26 ENCOUNTER — Ambulatory Visit (INDEPENDENT_AMBULATORY_CARE_PROVIDER_SITE_OTHER): Payer: Medicare Other | Admitting: Cardiology

## 2020-06-26 VITALS — BP 112/60 | HR 61 | Ht 62.0 in | Wt 252.0 lb

## 2020-06-26 DIAGNOSIS — I4819 Other persistent atrial fibrillation: Secondary | ICD-10-CM | POA: Diagnosis not present

## 2020-06-26 DIAGNOSIS — I5032 Chronic diastolic (congestive) heart failure: Secondary | ICD-10-CM | POA: Diagnosis not present

## 2020-06-26 DIAGNOSIS — I480 Paroxysmal atrial fibrillation: Secondary | ICD-10-CM

## 2020-06-26 DIAGNOSIS — Z7901 Long term (current) use of anticoagulants: Secondary | ICD-10-CM

## 2020-06-26 NOTE — Patient Instructions (Addendum)
Medication Instructions:  Your physician recommends that you continue on your current medications as directed. Please refer to the Current Medication list given to you today.  *If you need a refill on your cardiac medications before your next appointment, please call your pharmacy*   Lab Work: TODAY: CMET, MAGNESIUM  If you have labs (blood work) drawn today and your tests are completely normal, you will receive your results only by: Marland Kitchen MyChart Message (if you have MyChart) OR . A paper copy in the mail If you have any lab test that is abnormal or we need to change your treatment, we will call you to review the results.   Testing/Procedures: NONE   Follow-Up: At [facility], you and your health needs are our priority.  As part of our continuing mission to provide you with exceptional heart care, we have created designated Provider Care Teams.  These Care Teams include your primary Cardiologist (physician) and Advanced Practice Providers (APPs -  Physician Assistants and Nurse Practitioners) who all work together to provide you with the care you need, when you need it.  We recommend signing up for the patient portal called "MyChart".  Sign up information is provided on this After Visit Summary.  MyChart is used to connect with patients for Virtual Visits (Telemedicine).  Patients are able to view lab/test results, encounter notes, upcoming appointments, etc.  Non-urgent messages can be sent to your provider as well.   To learn more about what you can do with MyChart, go to [email or web address].    Your next appointment:   6 month(s)  The format for your next appointment:   In Person  Provider:   You may see DR. NISHAN or one of the following Advanced Practice Providers on your designated Care Team:    Kathyrn Drown, NP

## 2020-06-27 ENCOUNTER — Telehealth: Payer: Self-pay | Admitting: Internal Medicine

## 2020-06-27 ENCOUNTER — Encounter: Payer: Self-pay | Admitting: Internal Medicine

## 2020-06-27 LAB — COMPREHENSIVE METABOLIC PANEL
ALT: 20 IU/L (ref 0–32)
AST: 33 IU/L (ref 0–40)
Albumin/Globulin Ratio: 1 — ABNORMAL LOW (ref 1.2–2.2)
Albumin: 3.6 g/dL — ABNORMAL LOW (ref 3.8–4.8)
Alkaline Phosphatase: 106 IU/L (ref 44–121)
BUN/Creatinine Ratio: 16 (ref 12–28)
BUN: 24 mg/dL (ref 8–27)
Bilirubin Total: 0.6 mg/dL (ref 0.0–1.2)
CO2: 22 mmol/L (ref 20–29)
Calcium: 8.5 mg/dL — ABNORMAL LOW (ref 8.7–10.3)
Chloride: 92 mmol/L — ABNORMAL LOW (ref 96–106)
Creatinine, Ser: 1.54 mg/dL — ABNORMAL HIGH (ref 0.57–1.00)
Globulin, Total: 3.7 g/dL (ref 1.5–4.5)
Glucose: 190 mg/dL — ABNORMAL HIGH (ref 65–99)
Potassium: 4.3 mmol/L (ref 3.5–5.2)
Sodium: 136 mmol/L (ref 134–144)
Total Protein: 7.3 g/dL (ref 6.0–8.5)
eGFR: 37 mL/min/{1.73_m2} — ABNORMAL LOW (ref >59)

## 2020-06-27 LAB — MAGNESIUM: Magnesium: 0.8 mg/dL — CL (ref 1.6–2.3)

## 2020-06-27 NOTE — Telephone Encounter (Signed)
Patient would like Dr Jenny Reichmann to know her Mg is 0.8  Seeking advice

## 2020-06-27 NOTE — Telephone Encounter (Signed)
error 

## 2020-06-27 NOTE — Telephone Encounter (Signed)
Patient states that the pharmacy label was messed up and that she has only been taking Mg pill per day and was wondering if she starts taking 2 per day as stated will that resolve the issue?

## 2020-06-27 NOTE — Telephone Encounter (Signed)
Ok to take med as prescribed as this can only help try to resolve the issue

## 2020-06-28 NOTE — Telephone Encounter (Signed)
Patient notified to take two per day

## 2020-06-30 ENCOUNTER — Other Ambulatory Visit: Payer: Self-pay | Admitting: Internal Medicine

## 2020-07-03 ENCOUNTER — Telehealth (HOSPITAL_COMMUNITY): Payer: Self-pay

## 2020-07-03 ENCOUNTER — Encounter (HOSPITAL_COMMUNITY): Payer: Self-pay

## 2020-07-03 NOTE — Telephone Encounter (Signed)
Attempted to contact pt in regards to pulmonary rehab. LMTCB Mailed letter

## 2020-07-09 ENCOUNTER — Encounter: Payer: Self-pay | Admitting: Internal Medicine

## 2020-07-09 ENCOUNTER — Ambulatory Visit (INDEPENDENT_AMBULATORY_CARE_PROVIDER_SITE_OTHER): Payer: Medicare Other | Admitting: Internal Medicine

## 2020-07-09 ENCOUNTER — Other Ambulatory Visit: Payer: Self-pay

## 2020-07-09 VITALS — BP 130/82 | HR 97 | Temp 97.8°F | Ht 62.0 in | Wt 258.0 lb

## 2020-07-09 DIAGNOSIS — I5030 Unspecified diastolic (congestive) heart failure: Secondary | ICD-10-CM

## 2020-07-09 DIAGNOSIS — E538 Deficiency of other specified B group vitamins: Secondary | ICD-10-CM | POA: Diagnosis not present

## 2020-07-09 DIAGNOSIS — R87612 Low grade squamous intraepithelial lesion on cytologic smear of cervix (LGSIL): Secondary | ICD-10-CM | POA: Insufficient documentation

## 2020-07-09 DIAGNOSIS — Z0001 Encounter for general adult medical examination with abnormal findings: Secondary | ICD-10-CM | POA: Diagnosis not present

## 2020-07-09 DIAGNOSIS — N393 Stress incontinence (female) (male): Secondary | ICD-10-CM | POA: Insufficient documentation

## 2020-07-09 DIAGNOSIS — N951 Menopausal and female climacteric states: Secondary | ICD-10-CM | POA: Insufficient documentation

## 2020-07-09 DIAGNOSIS — R5381 Other malaise: Secondary | ICD-10-CM

## 2020-07-09 DIAGNOSIS — Z23 Encounter for immunization: Secondary | ICD-10-CM | POA: Diagnosis not present

## 2020-07-09 DIAGNOSIS — E1165 Type 2 diabetes mellitus with hyperglycemia: Secondary | ICD-10-CM | POA: Diagnosis not present

## 2020-07-09 DIAGNOSIS — E559 Vitamin D deficiency, unspecified: Secondary | ICD-10-CM | POA: Diagnosis not present

## 2020-07-09 DIAGNOSIS — I4819 Other persistent atrial fibrillation: Secondary | ICD-10-CM

## 2020-07-09 DIAGNOSIS — I10 Essential (primary) hypertension: Secondary | ICD-10-CM

## 2020-07-09 DIAGNOSIS — J438 Other emphysema: Secondary | ICD-10-CM

## 2020-07-09 LAB — HEMOGLOBIN A1C: Hgb A1c MFr Bld: 7 % — ABNORMAL HIGH (ref 4.6–6.5)

## 2020-07-09 LAB — BASIC METABOLIC PANEL
BUN: 23 mg/dL (ref 6–23)
CO2: 30 mEq/L (ref 19–32)
Calcium: 8.7 mg/dL (ref 8.4–10.5)
Chloride: 97 mEq/L (ref 96–112)
Creatinine, Ser: 1.27 mg/dL — ABNORMAL HIGH (ref 0.40–1.20)
GFR: 43.76 mL/min — ABNORMAL LOW (ref >60.00)
Glucose, Bld: 139 mg/dL — ABNORMAL HIGH (ref 70–99)
Potassium: 4.4 mEq/L (ref 3.5–5.1)
Sodium: 141 mEq/L (ref 135–145)

## 2020-07-09 LAB — CBC WITH DIFFERENTIAL/PLATELET
Basophils Absolute: 0.1 10*3/uL (ref 0.0–0.1)
Basophils Relative: 0.7 % (ref 0.0–3.0)
Eosinophils Absolute: 0.2 10*3/uL (ref 0.0–0.7)
Eosinophils Relative: 2.6 % (ref 0.0–5.0)
HCT: 39 % (ref 36.0–46.0)
Hemoglobin: 13.1 g/dL (ref 12.0–15.0)
Lymphocytes Relative: 29.6 % (ref 12.0–46.0)
Lymphs Abs: 2.7 10*3/uL (ref 0.7–4.0)
MCHC: 33.5 g/dL (ref 30.0–36.0)
MCV: 94 fl (ref 78.0–100.0)
Monocytes Absolute: 0.8 10*3/uL (ref 0.1–1.0)
Monocytes Relative: 8.3 % (ref 3.0–12.0)
Neutro Abs: 5.4 10*3/uL (ref 1.4–7.7)
Neutrophils Relative %: 58.8 % (ref 43.0–77.0)
Platelets: 238 10*3/uL (ref 150.0–400.0)
RBC: 4.15 Mil/uL (ref 3.87–5.11)
RDW: 15.7 % — ABNORMAL HIGH (ref 11.5–15.5)
WBC: 9.2 10*3/uL (ref 4.0–10.5)

## 2020-07-09 LAB — TSH: TSH: 6.11 u[IU]/mL — ABNORMAL HIGH (ref 0.35–4.50)

## 2020-07-09 LAB — VITAMIN D 25 HYDROXY (VIT D DEFICIENCY, FRACTURES): VITD: 22.54 ng/mL — ABNORMAL LOW (ref 30.00–100.00)

## 2020-07-09 LAB — LIPID PANEL
Cholesterol: 179 mg/dL (ref 0–200)
HDL: 37.4 mg/dL — ABNORMAL LOW (ref >39.00)
NonHDL: 141.14
Total CHOL/HDL Ratio: 5
Triglycerides: 234 mg/dL — ABNORMAL HIGH (ref 0.0–149.0)
VLDL: 46.8 mg/dL — ABNORMAL HIGH (ref 0.0–40.0)

## 2020-07-09 LAB — MICROALBUMIN / CREATININE URINE RATIO
Creatinine,U: 57.6 mg/dL
Microalb Creat Ratio: 1.2 mg/g (ref 0.0–30.0)
Microalb, Ur: <0.7 mg/dL (ref 0.0–1.9)

## 2020-07-09 LAB — VITAMIN B12: Vitamin B-12: 405 pg/mL (ref 211–911)

## 2020-07-09 LAB — HEPATIC FUNCTION PANEL
ALT: 17 U/L (ref 0–35)
AST: 22 U/L (ref 0–37)
Albumin: 3.7 g/dL (ref 3.5–5.2)
Alkaline Phosphatase: 72 U/L (ref 39–117)
Bilirubin, Direct: 0.1 mg/dL (ref 0.0–0.3)
Total Bilirubin: 0.6 mg/dL (ref 0.2–1.2)
Total Protein: 7.1 g/dL (ref 6.0–8.3)

## 2020-07-09 LAB — BRAIN NATRIURETIC PEPTIDE: Pro B Natriuretic peptide (BNP): 244 pg/mL — ABNORMAL HIGH (ref 0.0–100.0)

## 2020-07-09 LAB — LDL CHOLESTEROL, DIRECT: Direct LDL: 111 mg/dL

## 2020-07-09 NOTE — Progress Notes (Signed)
Patient ID: Andrea Perry, female   DOB: 1951/02/09, 70 y.o.   MRN: [id number]         Chief Complaint:: wellness exam and Follow-up DIast CHF, low magnesium, low K, deconditioning, dm, copd, aifb, thn       HPI:  Andrea Perry is a 70 y.o. female here for wellness exam, declines mamogram o/w up to date with preventive referrals and immunizations, though due for tdap and eye exam and colonoscopy later this year.  Ok for pneumovax now.                        Also now taking 2 pills mag oxide per day as oringally rx and for some reason was only 1 tab before recent low mg. Is now set up to have PT twice per wk starting soon, no further cramping for over 1 wk now on the twice per day magnesium.  Has lost wt mostly fluid recently, but now going to work on fat wt loss.  Plans to walk outside more to come.  Has 5 steps to get into the home, has regular walker but decliens rollater for now.  Pt denies chest pain, increased sob or doe, wheezing, orthopnea, PND, palpitations, dizziness or syncope.  Denies worsening focal neuro s/s.   Pt denies polydipsia, polyuria,   Pt denies fever, wt loss, night sweats, loss of appetite, or other constitutional symptoms  No further muscle cramping and spasms as before.  No other new complaints  Wt Readings from Last 3 Encounters:  07/09/20 258 lb (117 kg)  06/26/20 252 lb (114.3 kg)  06/03/20 261 lb 3.2 oz (118.5 kg)   BP Readings from Last 3 Encounters:  07/09/20 130/82  06/26/20 112/60  06/14/20 (!) 98/55   Immunization History  Administered Date(s) Administered  . Fluad Quad(high Dose 65+) 02/09/2019, 06/11/2020  . Influenza, High Dose Seasonal PF 03/23/2018  . Influenza,inj,Quad PF,6+ Mos 03/01/2013, 02/14/2014, 03/08/2015, 04/16/2016, 01/12/2017  . Influenza-Unspecified 01/18/2017  . Pneumococcal Conjugate-13 03/30/2013  . Pneumococcal Polysaccharide-23 04/04/2014, 07/09/2020  . Tdap 09/24/2010   There are no preventive care reminders to display for this  patient.    Past Medical History:  Diagnosis Date  . ALLERGIC RHINITIS 08/16/2008  . ANXIETY 08/16/2008  . Arthritis    hands, knees, lower back  . ASTHMA 08/16/2008  . Asthma    BRONCHITIS     DR. Lamonte Sakai    . Bladder leak   . Cancer ([place])    MELANOMA      . Chronic lower back pain    problems with disc L2-5  . COPD 08/16/2008  . DEPRESSION 08/16/2008  . Diabetes mellitus without complication ([place])   . Dysrhythmia    hx AF  . Excessive daytime sleepiness 06/19/2015  . GENITAL HERPES 12/03/2006  . GERD (gastroesophageal reflux disease)   . H/O hiatal hernia   . HEMATOCHEZIA 06/20/2009  . Hemorrhoids   . History of cardioversion 10/30/14  . History of kidney stones   . HYPERLIPIDEMIA 08/16/2008  . HYPERTENSION 12/03/2006  . Hypertension   . Impaired glucose tolerance 09/21/2010  . Overactive bladder 10/20/2015  . Pneumonia    as a child  . Pulmonary HTN ([place]) 06/19/2015  . Shortness of breath dyspnea   . Sleep apnea    MILD NO CPAP ORDERED  . Snoring 06/19/2015   Past Surgical History:  Procedure Laterality Date  . ABDOMINAL HYSTERECTOMY    . APPENDECTOMY    .  CARDIOVERSION N/A 03/20/2014   Procedure: CARDIOVERSION;  Surgeon: Josue Hector, MD;  Location: [facility] ENDOSCOPY;  Service: Cardiovascular;  Laterality: N/A;  . CARDIOVERSION N/A 10/30/2014   Procedure: CARDIOVERSION;  Surgeon: Josue Hector, MD;  Location: [facility] ENDOSCOPY;  Service: Cardiovascular;  Laterality: N/A;  . CARDIOVERSION N/A 04/03/2019   Procedure: CARDIOVERSION;  Surgeon: Dorothy Spark, MD;  Location: [facility] ENDOSCOPY;  Service: Cardiovascular;  Laterality: N/A;  . CARPAL TUNNEL RELEASE     Right + LEFT  . CESAREAN SECTION     x 1   . COLONOSCOPY  2004  . CYST EXCISION     RT ARM   . CYSTOCELE REPAIR N/A 10/25/2012   Procedure: ANTERIOR REPAIR (CYSTOCELE);  Surgeon: Gus Height, MD;  Location: [place] [facility];  Service: Gynecology;  Laterality: N/A;  . HEEL SPUR SURGERY Bilateral   . KNEE SURGERY     Right + LEFT  . LUMBAR  LAMINECTOMY/DECOMPRESSION MICRODISCECTOMY N/A 03/19/2016   Procedure: Laminectomy and Foraminotomy - Lumbar three-four - Lumbar four-five;  Surgeon: Eustace Moore, MD;  Location: [place];  Service: Neurosurgery;  Laterality: N/A;  . RADIOLOGY WITH ANESTHESIA Right 11/01/2014   Procedure: MRI RIGHT FOREARM;  Surgeon: Medication Radiologist, MD;  Location: [place];  Service: Radiology;  Laterality: Right;  . RADIOLOGY WITH ANESTHESIA Right 08/29/2015   Procedure: MRI - RIGHT FOREARM;  Surgeon: Medication Radiologist, MD;  Location: [place];  Service: Radiology;  Laterality: Right;  . RADIOLOGY WITH ANESTHESIA N/A 12/05/2015   Procedure: MRI SPINE WITHOUT;  Surgeon: Medication Radiologist, MD;  Location: [place];  Service: Radiology;  Laterality: N/A;  . SKIN CANCER EXCISION     BILAT SHOULDERS  . SPLIT NIGHT STUDY  09/04/2015  . TONSILLECTOMY AND ADENOIDECTOMY      reports that she quit smoking about 17 years ago. Her smoking use included cigarettes. She has a 45.00 pack-year smoking history. She has never used smokeless tobacco. She reports current alcohol use of about 2.0 standard drinks of alcohol per week. She reports that she does not use drugs. family history includes Colon polyps in her mother; Diabetes in her mother; Hyperlipidemia in her mother; Hypertension in her mother; Stroke in her father. Allergies  Allergen Reactions  . Tramadol Hcl Nausea And Vomiting and Other (See Comments)     mouth dryness, headache  . Codeine Nausea And Vomiting and Nausea Only  . Tape Rash    Plastic tape, bandaids and ekg leads, causes redness and rash  . Vicodin [Hydrocodone-Acetaminophen] Nausea And Vomiting   Current Outpatient Medications on File Prior to Visit  Medication Sig Dispense Refill  . acetaminophen (TYLENOL) 500 MG tablet Take 500 mg by mouth every 6 (six) hours as needed for mild pain or moderate pain.    Marland Kitchen albuterol (PROVENTIL) (2.5 MG/3ML) 0.083% nebulizer solution Use every 4 hours with  Ipratropium (Patient taking differently: Take 2.5 mg by nebulization every 4 (four) hours as needed for wheezing or shortness of breath.) 360 mL 12  . albuterol (VENTOLIN HFA) 108 (90 Base) MCG/ACT inhaler Inhale 2 puffs by mouth every 6 hours as needed for wheezing or shortness of breath. 18 g 5  . Blood Glucose Monitoring Suppl (FREESTYLE LITE) DEVI Use as directed daily E11.9 1 each 0  . BREZTRI AEROSPHERE 160-9-4.8 MCG/ACT AERO INHALE 2 PUFFS TWICE DAILY 11 g 1  . calcium-vitamin D (OSCAL 500/200 D-3) 500-200 MG-UNIT tablet Take 1 tablet by mouth 2 (two) times daily. 60 tablet 2  . cetirizine (  ZYRTEC) 10 MG tablet Take 10 mg by mouth daily.    . diphenoxylate-atropine (LOMOTIL) 2.5-0.025 MG tablet Take 1 tablet by mouth 4 (four) times daily as needed for diarrhea or loose stools. 40 tablet 0  . ELIQUIS 5 MG TABS tablet Take 1 tablet by mouth twice daily 60 tablet 5  . escitalopram (LEXAPRO) 20 MG tablet Take 1 tablet by mouth once daily 90 tablet 1  . estradiol (ESTRACE) 1 MG tablet Take 1 mg by mouth daily.    Arna Medici 25 MCG tablet TAKE ONE TABLET BY MOUTH ONCE DAILY BEFORE BREAKFAST. PATIENT NEEDS OFFICE VISIT. 30 tablet 0  . furosemide (LASIX) 40 MG tablet Take 1 tablet by mouth once daily 90 tablet 3  . glucose blood (FREESTYLE LITE) test strip Use as instructed once daily E11.9 100 each 12  . guaiFENesin (MUCINEX) 600 MG 12 hr tablet Take 600 mg by mouth 2 (two) times daily as needed for cough.    Marland Kitchen ipratropium (ATROVENT) 0.02 % nebulizer solution Take 2.5 mLs (0.5 mg total) by nebulization 4 (four) times daily. (Patient taking differently: Take 0.5 mg by nebulization every 4 (four) hours as needed for wheezing or shortness of breath.) 300 mL 12  . irbesartan (AVAPRO) 150 MG tablet Take 1 tablet by mouth once daily 90 tablet 1  . Lancets MISC Use as directed once daily E11.9 100 each 11  . LORazepam (ATIVAN) 1 MG tablet Take 1 tablet by mouth twice daily as needed for anxiety 60 tablet 5   . metFORMIN (GLUCOPHAGE-XR) 500 MG 24 hr tablet Take 2 tablets (1,000 mg total) by mouth daily with breakfast. 180 tablet 3  . metoprolol tartrate (LOPRESSOR) 100 MG tablet Take 100 mg by mouth 2 (two) times daily.    . pantoprazole (PROTONIX) 40 MG tablet Take 1 tablet by mouth once daily 90 tablet 3  . potassium chloride SA (KLOR-CON) 20 MEQ tablet Take 1 tablet (20 mEq total) by mouth daily. 90 tablet 3  . pravastatin (PRAVACHOL) 40 MG tablet Take 1 tablet by mouth once daily 90 tablet 0  . tiZANidine (ZANAFLEX) 2 MG tablet Take 1 tablet (2 mg total) by mouth every 6 (six) hours as needed for muscle spasms. 60 tablet 2   No current facility-administered medications on file prior to visit.        ROS:  All others reviewed and negative.  Objective        PE:  BP 130/82   Pulse 97   Temp 97.8 F (36.6 C) (Oral)   Ht 5\' 2"  (1.575 m)   Wt 258 lb (117 kg)   SpO2 97%   BMI 47.19 kg/m                 Constitutional: Pt appears in NAD               HENT: Head: NCAT.                Right Ear: External ear normal.                 Left Ear: External ear normal.                Eyes: . Pupils are equal, round, and reactive to light. Conjunctivae and EOM are normal               Nose: without d/c or deformity  Neck: Neck supple. Gross normal ROM               Cardiovascular: Normal rate and regular rhythm.                 Pulmonary/Chest: Effort normal and breath sounds without rales or wheezing.                Abd:  Soft, NT, ND, + BS, no organomegaly               Neurological: Pt is alert. At baseline orientation, motor grossly intact               Skin: Skin is warm. No rashes, no other new lesions, LE edema - trace bilat               Psychiatric: Pt behavior is normal without agitation   Micro: none  Cardiac tracings I have personally interpreted today:  none  Pertinent Radiological findings (summarize): none   Lab Results  Component Value Date   WBC 9.2  07/09/2020   HGB 13.1 07/09/2020   HCT 39.0 07/09/2020   PLT 238.0 07/09/2020   GLUCOSE 139 (H) 07/09/2020   CHOL 179 07/09/2020   TRIG 234.0 (H) 07/09/2020   HDL 37.40 (L) 07/09/2020   LDLDIRECT 111.0 07/09/2020   LDLCALC 87 03/23/2018   ALT 17 07/09/2020   AST 22 07/09/2020   NA 141 07/09/2020   K 4.4 07/09/2020   CL 97 07/09/2020   CREATININE 1.27 (H) 07/09/2020   BUN 23 07/09/2020   CO2 30 07/09/2020   TSH 6.11 (H) 07/09/2020   INR 1.00 03/18/2016   HGBA1C 7.0 (H) 07/09/2020   MICROALBUR <0.7 07/09/2020   Assessment/Plan:  SAILOR HEVIA is a 70 y.o. White or Caucasian [1] female with  has a past medical history of ALLERGIC RHINITIS (08/16/2008), ANXIETY (08/16/2008), Arthritis, ASTHMA (08/16/2008), Asthma, Bladder leak, Cancer ([place]), Chronic lower back pain, COPD (08/16/2008), DEPRESSION (08/16/2008), Diabetes mellitus without complication ([place]), Dysrhythmia, Excessive daytime sleepiness (06/19/2015), GENITAL HERPES (12/03/2006), GERD (gastroesophageal reflux disease), H/O hiatal hernia, HEMATOCHEZIA (06/20/2009), Hemorrhoids, History of cardioversion (10/30/14), History of kidney stones, HYPERLIPIDEMIA (08/16/2008), HYPERTENSION (12/03/2006), Hypertension, Impaired glucose tolerance (09/21/2010), Overactive bladder (10/20/2015), Pneumonia, Pulmonary HTN ([place]) (06/19/2015), Shortness of breath dyspnea, Sleep apnea, and Snoring (06/19/2015).  Encounter for well adult exam with abnormal findings Age and sex appropriate education and counseling updated with regular exercise and diet Referrals for preventative services - declines mammogram, but due for eye exam and colonoscopy later this yr Immunizations addressed - due to tdap later this yr Smoking counseling  - none needed Evidence for depression or other mood disorder - none significant Most recent labs reviewed. I have personally reviewed and have noted: 1) the patient's medical and social history 2) The patient's current medications and  supplements 3) The patient's height, weight, and BMI have been recorded in the chart   CHF (congestive heart failure) ([place]) Exam and wt imprpved, cont current med tx - lasix   Physical deconditioning To continue PT,  to f/u any worsening symptoms or concerns  Hypomagnesemia For f/u lab, symptomatically much improved  Essential hypertension Low normal, asympt, cont current med tx - avapro, lopressor   Current Outpatient Medications (Endocrine & Metabolic):  .  estradiol (ESTRACE) 1 MG tablet, Take 1 mg by mouth daily. Arna Medici 25 MCG tablet, TAKE ONE TABLET BY MOUTH ONCE DAILY BEFORE BREAKFAST. PATIENT NEEDS OFFICE VISIT. Marland Kitchen  metFORMIN (GLUCOPHAGE-XR) 500 MG  24 hr tablet, Take 2 tablets (1,000 mg total) by mouth daily with breakfast.  Current Outpatient Medications (Cardiovascular):  .  furosemide (LASIX) 40 MG tablet, Take 1 tablet by mouth once daily .  irbesartan (AVAPRO) 150 MG tablet, Take 1 tablet by mouth once daily .  metoprolol tartrate (LOPRESSOR) 100 MG tablet, Take 100 mg by mouth 2 (two) times daily. .  pravastatin (PRAVACHOL) 40 MG tablet, Take 1 tablet by mouth once daily  Current Outpatient Medications (Respiratory):  .  albuterol (PROVENTIL) (2.5 MG/3ML) 0.083% nebulizer solution, Use every 4 hours with Ipratropium (Patient taking differently: Take 2.5 mg by nebulization every 4 (four) hours as needed for wheezing or shortness of breath.) .  albuterol (VENTOLIN HFA) 108 (90 Base) MCG/ACT inhaler, Inhale 2 puffs by mouth every 6 hours as needed for wheezing or shortness of breath. Marland Kitchen  BREZTRI AEROSPHERE 160-9-4.8 MCG/ACT AERO, INHALE 2 PUFFS TWICE DAILY .  cetirizine (ZYRTEC) 10 MG tablet, Take 10 mg by mouth daily. Marland Kitchen  guaiFENesin (MUCINEX) 600 MG 12 hr tablet, Take 600 mg by mouth 2 (two) times daily as needed for cough. Marland Kitchen  ipratropium (ATROVENT) 0.02 % nebulizer solution, Take 2.5 mLs (0.5 mg total) by nebulization 4 (four) times daily. (Patient taking  differently: Take 0.5 mg by nebulization every 4 (four) hours as needed for wheezing or shortness of breath.)  Current Outpatient Medications (Analgesics):  .  acetaminophen (TYLENOL) 500 MG tablet, Take 500 mg by mouth every 6 (six) hours as needed for mild pain or moderate pain.  Current Outpatient Medications (Hematological):  Marland Kitchen  ELIQUIS 5 MG TABS tablet, Take 1 tablet by mouth twice daily  Current Outpatient Medications (Other):  .  Blood Glucose Monitoring Suppl (FREESTYLE LITE) DEVI, Use as directed daily E11.9 .  calcium-vitamin D (OSCAL 500/200 D-3) 500-200 MG-UNIT tablet, Take 1 tablet by mouth 2 (two) times daily. .  diphenoxylate-atropine (LOMOTIL) 2.5-0.025 MG tablet, Take 1 tablet by mouth 4 (four) times daily as needed for diarrhea or loose stools. Marland Kitchen  escitalopram (LEXAPRO) 20 MG tablet, Take 1 tablet by mouth once daily .  glucose blood (FREESTYLE LITE) test strip, Use as instructed once daily E11.9 .  Lancets MISC, Use as directed once daily E11.9 .  LORazepam (ATIVAN) 1 MG tablet, Take 1 tablet by mouth twice daily as needed for anxiety .  pantoprazole (PROTONIX) 40 MG tablet, Take 1 tablet by mouth once daily .  potassium chloride SA (KLOR-CON) 20 MEQ tablet, Take 1 tablet (20 mEq total) by mouth daily. Marland Kitchen  tiZANidine (ZANAFLEX) 2 MG tablet, Take 1 tablet (2 mg total) by mouth every 6 (six) hours as needed for muscle spasms. .  Magnesium Oxide 500 MG CAPS, 1 tab by mouth twice per day   Diabetes Lab Results  Component Value Date   HGBA1C 7.0 (H) 07/09/2020   Stable, pt to continue current medical treatment metformin   COPD (chronic obstructive pulmonary disease) (HCC) Stable overall, cont albut in haler prn  A-fib ([facility]) Stable rate and volume, cont current lopressor and eliquis  Vitamin D deficiency Last vitamin D Lab Results  Component Value Date   VD25OH 22.54 (L) 07/09/2020   Low, to start oral replacement   Followup: Return in about 6 months (around  01/09/2021).  Cathlean Cower, MD 07/15/2020 5:25 AM [facility]

## 2020-07-09 NOTE — Patient Instructions (Signed)
You had the Pneumovax pneumonia shot today  Please continue all other medications as before, and refills have been done if requested.  Please have the pharmacy call with any other refills you may need.  Please continue your efforts at being more active, low cholesterol diet, and weight control.  You are otherwise up to date with prevention measures today.  Please keep your appointments with your specialists as you may have planned  Please go to the LAB at the blood drawing area for the tests to be done  You will be contacted by phone if any changes need to be made immediately.  Otherwise, you will receive a letter about your results with an explanation, but please check with MyChart first.  Please remember to sign up for MyChart if you have not done so, as this will be important to you in the future with finding out test results, communicating by private email, and scheduling acute appointments online when needed.  Please make an Appointment to return in 6 months, or sooner if needed

## 2020-07-10 ENCOUNTER — Encounter: Payer: Self-pay | Admitting: Internal Medicine

## 2020-07-10 LAB — URINALYSIS, ROUTINE W REFLEX MICROSCOPIC
Bilirubin Urine: NEGATIVE
Hgb urine dipstick: NEGATIVE
Ketones, ur: NEGATIVE
Nitrite: NEGATIVE
RBC / HPF: NONE SEEN (ref 0–?)
Specific Gravity, Urine: 1.01 (ref 1.000–1.030)
Total Protein, Urine: NEGATIVE
Urine Glucose: NEGATIVE
Urobilinogen, UA: 0.2 (ref 0.0–1.0)
pH: 6 (ref 5.0–8.0)

## 2020-07-11 ENCOUNTER — Telehealth: Payer: Self-pay

## 2020-07-11 ENCOUNTER — Other Ambulatory Visit: Payer: Self-pay | Admitting: Internal Medicine

## 2020-07-11 ENCOUNTER — Other Ambulatory Visit (INDEPENDENT_AMBULATORY_CARE_PROVIDER_SITE_OTHER): Payer: Medicare Other

## 2020-07-11 LAB — MAGNESIUM: Magnesium: 0.8 mg/dL — CL (ref 1.5–2.5)

## 2020-07-11 MED ORDER — MAGNESIUM OXIDE -MG SUPPLEMENT 500 MG PO CAPS: ORAL_CAPSULE | ORAL | 5 refills | Status: DC

## 2020-07-11 NOTE — Telephone Encounter (Signed)
Ok to contact pt  Magnesium 0.8 today (same as last test result)  Ok to increase the magnesium oxice to 500 mg bid - done erx  Please ask pt to recheck Magnesium level in 2 weeks - order done for [facility] or [facility] lab site

## 2020-07-11 NOTE — Telephone Encounter (Signed)
CRITICAL VALUE STICKER  CRITICAL VALUE: Magnesium 0.8  RECEIVER (on-site recipient of call):  Elza Rafter rnc  DATE & TIME NOTIFIED: 07/11/20 at 1051  MESSENGER (representative from lab): Hope  MD NOTIFIED: Dr Cathlean Cower  TIME OF NOTIFICATION: 1052  RESPONSE: Awaiting response

## 2020-07-15 ENCOUNTER — Encounter: Payer: Self-pay | Admitting: Internal Medicine

## 2020-07-15 DIAGNOSIS — E559 Vitamin D deficiency, unspecified: Secondary | ICD-10-CM | POA: Insufficient documentation

## 2020-07-15 NOTE — Assessment & Plan Note (Signed)
Stable rate and volume, cont current lopressor and eliquis

## 2020-07-15 NOTE — Assessment & Plan Note (Signed)
Age and sex appropriate education and counseling updated with regular exercise and diet Referrals for preventative services - declines mammogram, but due for eye exam and colonoscopy later this yr Immunizations addressed - due to tdap later this yr Smoking counseling  - none needed Evidence for depression or other mood disorder - none significant Most recent labs reviewed. I have personally reviewed and have noted: 1) the patient's medical and social history 2) The patient's current medications and supplements 3) The patient's height, weight, and BMI have been recorded in the chart

## 2020-07-15 NOTE — Assessment & Plan Note (Signed)
To continue PT,  to f/u any worsening symptoms or concerns

## 2020-07-15 NOTE — Assessment & Plan Note (Signed)
Low normal, asympt, cont current med tx - avapro, lopressor   Current Outpatient Medications (Endocrine & Metabolic):  .  estradiol (ESTRACE) 1 MG tablet, Take 1 mg by mouth daily. Arna Medici 25 MCG tablet, TAKE ONE TABLET BY MOUTH ONCE DAILY BEFORE BREAKFAST. PATIENT NEEDS OFFICE VISIT. Marland Kitchen  metFORMIN (GLUCOPHAGE-XR) 500 MG 24 hr tablet, Take 2 tablets (1,000 mg total) by mouth daily with breakfast.  Current Outpatient Medications (Cardiovascular):  .  furosemide (LASIX) 40 MG tablet, Take 1 tablet by mouth once daily .  irbesartan (AVAPRO) 150 MG tablet, Take 1 tablet by mouth once daily .  metoprolol tartrate (LOPRESSOR) 100 MG tablet, Take 100 mg by mouth 2 (two) times daily. .  pravastatin (PRAVACHOL) 40 MG tablet, Take 1 tablet by mouth once daily  Current Outpatient Medications (Respiratory):  .  albuterol (PROVENTIL) (2.5 MG/3ML) 0.083% nebulizer solution, Use every 4 hours with Ipratropium (Patient taking differently: Take 2.5 mg by nebulization every 4 (four) hours as needed for wheezing or shortness of breath.) .  albuterol (VENTOLIN HFA) 108 (90 Base) MCG/ACT inhaler, Inhale 2 puffs by mouth every 6 hours as needed for wheezing or shortness of breath. Marland Kitchen  BREZTRI AEROSPHERE 160-9-4.8 MCG/ACT AERO, INHALE 2 PUFFS TWICE DAILY .  cetirizine (ZYRTEC) 10 MG tablet, Take 10 mg by mouth daily. Marland Kitchen  guaiFENesin (MUCINEX) 600 MG 12 hr tablet, Take 600 mg by mouth 2 (two) times daily as needed for cough. Marland Kitchen  ipratropium (ATROVENT) 0.02 % nebulizer solution, Take 2.5 mLs (0.5 mg total) by nebulization 4 (four) times daily. (Patient taking differently: Take 0.5 mg by nebulization every 4 (four) hours as needed for wheezing or shortness of breath.)  Current Outpatient Medications (Analgesics):  .  acetaminophen (TYLENOL) 500 MG tablet, Take 500 mg by mouth every 6 (six) hours as needed for mild pain or moderate pain.  Current Outpatient Medications (Hematological):  Marland Kitchen  ELIQUIS 5 MG TABS tablet,  Take 1 tablet by mouth twice daily  Current Outpatient Medications (Other):  .  Blood Glucose Monitoring Suppl (FREESTYLE LITE) DEVI, Use as directed daily E11.9 .  calcium-vitamin D (OSCAL 500/200 D-3) 500-200 MG-UNIT tablet, Take 1 tablet by mouth 2 (two) times daily. .  diphenoxylate-atropine (LOMOTIL) 2.5-0.025 MG tablet, Take 1 tablet by mouth 4 (four) times daily as needed for diarrhea or loose stools. Marland Kitchen  escitalopram (LEXAPRO) 20 MG tablet, Take 1 tablet by mouth once daily .  glucose blood (FREESTYLE LITE) test strip, Use as instructed once daily E11.9 .  Lancets MISC, Use as directed once daily E11.9 .  LORazepam (ATIVAN) 1 MG tablet, Take 1 tablet by mouth twice daily as needed for anxiety .  pantoprazole (PROTONIX) 40 MG tablet, Take 1 tablet by mouth once daily .  potassium chloride SA (KLOR-CON) 20 MEQ tablet, Take 1 tablet (20 mEq total) by mouth daily. Marland Kitchen  tiZANidine (ZANAFLEX) 2 MG tablet, Take 1 tablet (2 mg total) by mouth every 6 (six) hours as needed for muscle spasms. .  Magnesium Oxide 500 MG CAPS, 1 tab by mouth twice per day

## 2020-07-15 NOTE — Assessment & Plan Note (Signed)
Exam and wt imprpved, cont current med tx - lasix

## 2020-07-15 NOTE — Assessment & Plan Note (Signed)
For f/u lab, symptomatically much improved

## 2020-07-15 NOTE — Assessment & Plan Note (Signed)
Stable overall, cont albut in haler prn

## 2020-07-15 NOTE — Assessment & Plan Note (Signed)
Last vitamin D Lab Results  Component Value Date   VD25OH 22.54 (L) 07/09/2020   Low, to start oral replacement

## 2020-07-15 NOTE — Assessment & Plan Note (Signed)
Lab Results  Component Value Date   HGBA1C 7.0 (H) 07/09/2020   Stable, pt to continue current medical treatment metformin

## 2020-07-18 NOTE — Telephone Encounter (Signed)
No response from pt.  Closed referral  

## 2020-07-23 ENCOUNTER — Other Ambulatory Visit: Payer: Self-pay | Admitting: Internal Medicine

## 2020-07-23 NOTE — Telephone Encounter (Signed)
Please refill as per office routine med refill policy (all routine meds refilled for 3 mo or monthly per pt preference up to one year from last visit, then month to month grace period for 3 mo, then further med refills will have to be denied)  

## 2020-08-12 DIAGNOSIS — Z961 Presence of intraocular lens: Secondary | ICD-10-CM | POA: Diagnosis not present

## 2020-08-15 ENCOUNTER — Ambulatory Visit: Payer: Medicare Other | Admitting: Sports Medicine

## 2020-08-19 ENCOUNTER — Other Ambulatory Visit: Payer: Self-pay | Admitting: Internal Medicine

## 2020-08-19 NOTE — Telephone Encounter (Signed)
Please refill as per office routine med refill policy (all routine meds refilled for 3 mo or monthly per pt preference up to one year from last visit, then month to month grace period for 3 mo, then further med refills will have to be denied)  

## 2020-08-20 ENCOUNTER — Other Ambulatory Visit: Payer: Self-pay

## 2020-08-20 ENCOUNTER — Ambulatory Visit (INDEPENDENT_AMBULATORY_CARE_PROVIDER_SITE_OTHER): Payer: Medicare Other | Admitting: Sports Medicine

## 2020-08-20 VITALS — BP 133/74 | Ht 62.0 in | Wt 258.0 lb

## 2020-08-20 DIAGNOSIS — M25511 Pain in right shoulder: Secondary | ICD-10-CM | POA: Diagnosis not present

## 2020-08-20 MED ORDER — METHYLPREDNISOLONE ACETATE 40 MG/ML IJ SUSP
40.0000 mg | Freq: Once | INTRAMUSCULAR | Status: AC
  Administered 2020-08-20: 40 mg via INTRA_ARTICULAR

## 2020-08-20 NOTE — Progress Notes (Signed)
d 

## 2020-08-21 ENCOUNTER — Encounter: Payer: Self-pay | Admitting: Sports Medicine

## 2020-08-21 NOTE — Progress Notes (Signed)
   Subjective:    Patient ID: Andrea Perry, female    DOB: [birth year], 70 y.o.   MRN: [id number]  HPI chief complaint: Right shoulder pain  Andrea Perry comes in today requesting a repeat cortisone injection into the right shoulder.  She has had intermittent shoulder pain for years.  An x-ray and 2017 showed some mild degenerative changes.  She has not had any recent imaging.  Pain is diffuse throughout the shoulder and worse with reaching away from her body.  Cortisone injections have been temporarily helpful in the past.  She denies any recent trauma.  Interim medical history reviewed Medications reviewed Allergies reviewed    Review of Systems    As above Objective:   Physical Exam  Well-developed, well-nourished.  No acute distress  Right shoulder: Good active and passive range of motion.  She is tender to palpation along the subacromial bursa.  No tenderness at the [facility] joint.  Positive empty can, positive Hawkins.  5/5 strength with resisted supraspinatus but 4/5 strength with resisted external rotation.  Good strength with resisted internal rotation.  Neurovascularly intact distally.      Assessment & Plan:   Returning right shoulder pain likely secondary to rotator cuff tendinopathy versus chronic rotator cuff tearing  Patient's right subacromial space was injected with cortisone today.  This was accomplished atraumatically under sterile technique.  She tolerates this without difficulty.  If pain persists despite todays injection then I would consider an updated x-ray and possibly an MRI.  Follow-up for ongoing or recalcitrant issues.  Consent obtained and verified. Time-out conducted. Noted no overlying erythema, induration, or other signs of local infection. Skin prepped in a sterile fashion. Topical analgesic spray: Ethyl chloride. Joint: right shoulder (subacromial) Needle: 25g 1.5 inch Completed without difficulty. Meds: 3cc 1% xylocaine, 1cc depomedrol  Advised to call  if fevers/chills, erythema, induration, drainage, or persistent bleeding.

## 2020-08-22 ENCOUNTER — Encounter: Payer: Self-pay | Admitting: Emergency Medicine

## 2020-08-22 ENCOUNTER — Other Ambulatory Visit: Payer: Self-pay

## 2020-08-22 ENCOUNTER — Ambulatory Visit (INDEPENDENT_AMBULATORY_CARE_PROVIDER_SITE_OTHER): Payer: Medicare Other | Admitting: Emergency Medicine

## 2020-08-22 ENCOUNTER — Ambulatory Visit: Payer: Medicare Other | Admitting: Sports Medicine

## 2020-08-22 DIAGNOSIS — R0602 Shortness of breath: Secondary | ICD-10-CM | POA: Diagnosis not present

## 2020-08-22 NOTE — Patient Instructions (Signed)
Walking oximetry on room air Please continue Breztri 2 puffs twice a day.  Rinse and gargle after using. Keep your albuterol inhaler available to use 2 puffs up to every 4 hours if needed for shortness of breath, chest tightness, wheezing. You can use your albuterol/ipratropium nebulizer treatments up to every 6 hours if you need them for shortness of breath, chest tightness, wheezing.  You are probably overusing these at this time.  See if you can get away with decreasing the frequency. Increase your Lasix to 40 mg twice a day for the next 2 days and then go back to your usual 40 mg once a day. Follow with Dr Lamonte Sakai in 6 months or sooner if you have any problems

## 2020-08-22 NOTE — Assessment & Plan Note (Signed)
Progressive dyspnea and woman with history of both cardiac and pulmonary disease.  No active wheezing to suggest an acute exacerbation of her COPD.  Plan to continue her [name], optimize her rescue medications.  Perform a walking oximetry to rule out occult desaturation.  She has lower extremity edema and history of CHF.  I will increase her Lasix to twice a day for 2 days and then back to her usual once daily.  Walking oximetry on room air Please continue Breztri 2 puffs twice a day.  Rinse and gargle after using. Keep your albuterol inhaler available to use 2 puffs up to every 4 hours if needed for shortness of breath, chest tightness, wheezing. You can use your albuterol/ipratropium nebulizer treatments up to every 6 hours if you need them for shortness of breath, chest tightness, wheezing.  You are probably overusing these at this time.  See if you can get away with decreasing the frequency. Increase your Lasix to 40 mg twice a day for the next 2 days and then go back to your usual 40 mg once a day. Follow with Dr Lamonte Sakai in 6 months or sooner if you have any problems

## 2020-08-22 NOTE — Progress Notes (Signed)
Subjective:    Patient ID: Andrea Perry, female    DOB: Nov 20, 1950, 70 y.o.   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  Also followed by cardiology for atrial fibrillation, hypertension with some secondary pulmonary hypertension.  Andrea Perry has had some difficulty getting Andrea Perry medications due to cost, the donut hole in Andrea Perry insurance coverage.  Andrea Perry has been managed on Breztri. DuoNeb on a schedule 4x a day. Andrea Perry A Fib has been labile - underwent DCCV in December but did not stay in NSR, being considered for possible ablation.   Andrea Perry notices that Andrea Perry is having coughing when Andrea Perry eats or drinks, ? Some occult aspiration. Andrea Perry has GERD, seems to be controlled on PPI. Andrea Perry has nasal congestion, gtt is fairly well managed on zyrtec. Andrea Perry likes the [place], benefits from this. Last flare was last year some time.   Andrea Perry has not decide yet whether Andrea Perry is going to get the COVID shot. Andrea Perry has never had anaphylaxis or a rxn to vaccine of any kind. Andrea Perry recommended that Andrea Perry receive  Andrea Perry snores heavily, has some interrupted sleep.  PSG with sleep apnea but on the mild side and we deferred treatment.  Andrea Perry is willing to go back and have this repeated, consider CPAP if indicated.  ROV 08/22/20 --70 year old former smoker with asthmatic COPD and mild peripheral eosinophilia, allergic rhinitis, atrial fibrillation, diabetes, hypertension and secondary pulmonary hypertension.  Andrea Perry follows up today after COVID-19 infection in February 4098 that was complicated by atrial fibrillation with RVR and decompensated CHF.  Andrea Perry is on Lasix 40 mg once daily, metoprolol Currently managed on Breztri.  Andrea Perry has albuterol and uses approximately 1-2x a day. Andrea Perry is using atrovent / albuterol nebs 4-5x a day also. Andrea Perry intermittently has bad days with increased exertional SOB. Trouble doing  housework. Andrea Perry did not take Andrea Perry lasix today.  On Zyrtec, Protonix once daily, Mucinex as needed    Review of Systems  Constitutional: Negative for fever and unexpected weight change.  HENT: Negative for congestion, dental problem, ear pain, nosebleeds, postnasal drip, rhinorrhea, sinus pressure, sneezing, sore throat and trouble swallowing.   Eyes: Negative for redness and itching.  Respiratory: Positive for cough and shortness of breath. Negative for chest tightness and wheezing.   Cardiovascular: Negative for palpitations and leg swelling.  Gastrointestinal: Negative for nausea and vomiting.  Genitourinary: Negative for dysuria.  Musculoskeletal: Negative for joint swelling.  Skin: Negative for rash.  Neurological: Negative for headaches.  Hematological: Does not bruise/bleed easily.  Psychiatric/Behavioral: Negative for dysphoric mood. The patient is not nervous/anxious.        Objective:   Physical Exam Vitals:   08/22/20 1221  BP: 118/70  Pulse: 93  Temp: (!) 97.1 F (36.2 C)  TempSrc: Temporal  SpO2: 95%  Weight: 267 lb (121.1 kg)  Height: 5\' 2"  (1.575 m)   Gen: Pleasant, overwt,  in no distress,  normal affect  ENT: No lesions,  mouth clear,  oropharynx clear, no postnasal drip  Neck: No JVD, no stridor  Lungs: No use of accessory muscles, clear, distant at both bases  Cardiovascular: RRR, heart sounds normal, no murmur or gallops, bilateral ankle peripheral edema  Musculoskeletal: No deformities, bilateral foot cyanosis, venous insufficiency  Neuro: alert, non focal  Skin: Warm, no lesions or rashes      Assessment & Plan:  Shortness  of breath Progressive dyspnea and woman with history of both cardiac and pulmonary disease.  No active wheezing to suggest an acute exacerbation of Andrea Perry COPD.  Plan to continue Andrea Perry [name], optimize Andrea Perry rescue medications.  Perform a walking oximetry to rule out occult desaturation.  Andrea Perry has lower extremity edema and history  of CHF.  Andrea Perry will increase Andrea Perry Lasix to twice a day for 2 days and then back to Andrea Perry usual once daily.  Walking oximetry on room air Please continue Breztri 2 puffs twice a day.  Rinse and gargle after using. Keep your albuterol inhaler available to use 2 puffs up to every 4 hours if needed for shortness of breath, chest tightness, wheezing. You can use your albuterol/ipratropium nebulizer treatments up to every 6 hours if you need them for shortness of breath, chest tightness, wheezing.  You are probably overusing these at this time.  See if you can get away with decreasing the frequency. Increase your Lasix to 40 mg twice a day for the next 2 days and then go back to your usual 40 mg once a day. Follow with Dr Lamonte Sakai in 6 months or sooner if you have any problems  Baltazar Apo, MD, PhD 08/22/2020, 2:59 PM [facility] [phone] or if no answer [phone]

## 2020-09-03 DIAGNOSIS — E119 Type 2 diabetes mellitus without complications: Secondary | ICD-10-CM | POA: Diagnosis not present

## 2020-10-10 ENCOUNTER — Other Ambulatory Visit: Payer: Self-pay | Admitting: Internal Medicine

## 2020-10-10 ENCOUNTER — Other Ambulatory Visit: Payer: Self-pay | Admitting: Cardiovascular Disease

## 2020-10-10 NOTE — Telephone Encounter (Signed)
Please refill as per office routine med refill policy (all routine meds refilled for 3 mo or monthly per pt preference up to one year from last visit, then month to month grace period for 3 mo, then further med refills will have to be denied)  

## 2020-10-10 NOTE — Telephone Encounter (Signed)
Age 70, weight 121kg, SCr 1.27 on 07/09/20 Last visit March 2022, afib indication

## 2020-10-11 ENCOUNTER — Other Ambulatory Visit: Payer: Self-pay | Admitting: Emergency Medicine

## 2020-10-25 ENCOUNTER — Other Ambulatory Visit: Payer: Self-pay | Admitting: Emergency Medicine

## 2020-10-26 ENCOUNTER — Other Ambulatory Visit: Payer: Self-pay | Admitting: Internal Medicine

## 2020-10-27 NOTE — Telephone Encounter (Signed)
Please refill as per office routine med refill policy (all routine meds refilled for 3 mo or monthly per pt preference up to one year from last visit, then month to month grace period for 3 mo, then further med refills will have to be denied)  

## 2020-10-29 ENCOUNTER — Other Ambulatory Visit: Payer: Self-pay | Admitting: Internal Medicine

## 2020-11-05 ENCOUNTER — Telehealth: Payer: Self-pay | Admitting: Emergency Medicine

## 2020-11-05 NOTE — Telephone Encounter (Signed)
Called and spoke with patient regarding Breztri. Patient would like to know if there is anything cheaper that can be prescribed for her.   I called [facility] and they state that since it went through on the insurance, it will not give any alternatives for cheaper inhaler. [facility] states her co-pay is $150.   Dr. Lamonte Sakai please advise

## 2020-11-07 NOTE — Telephone Encounter (Signed)
Called patient to confirm her insurance information we have on file. She did not answer. Left message for her to call back.

## 2020-11-07 NOTE — Telephone Encounter (Signed)
Please refer her to discuss with our Clinical Pharmacist - need more info re: her insurance formulary, whether she is in donut hole, whether she can get financial assistance.

## 2020-11-08 ENCOUNTER — Other Ambulatory Visit (HOSPITAL_COMMUNITY): Payer: Self-pay

## 2020-11-08 NOTE — Telephone Encounter (Signed)
Andrea Perry, nurse for Andrea Perry for f/u on this, thanks!

## 2020-11-08 NOTE — Telephone Encounter (Signed)
ATC, left VM, will route message to Dr. Lamonte Sakai as Juluis Rainier as to what the pharmacist sait.  Dr. Lamonte Sakai, just FYI on what [facility] the pharmacist said. Thanks!

## 2020-11-08 NOTE — Telephone Encounter (Addendum)
Patient's copay for [name] is $153 since she is Medicare Part D donut hole. There is PAF asthma grant currently open but will ned income information from patient to complete (max $1500 per year)  More sustainable strategy is to enroll patient into AZ&Me patient assistance which will require she sign and complete form on her end. Will mail to patient with note to complete and return to clinic. Will place provider portion in Dr. Lamonte Sakai mailbox to have completed pending patient portion.  Routing to triage for f/u.  ATC patient to enroll patient but unable to reach. Left VM requesting return call  Knox Saliva, PharmD, MPH, BCPS Clinical Pharmacist (Rheumatology and Pulmonology)

## 2020-11-08 NOTE — Telephone Encounter (Signed)
Patient portion of application was mailed to address on file. Provider portion placed in Dr. Agustina Caroli mailbox today  Knox Saliva, PharmD, MPH, BCPS Clinical Pharmacist (Rheumatology and Pulmonology)

## 2020-11-08 NOTE — Telephone Encounter (Signed)
Thank you for working on this.

## 2020-11-08 NOTE — Telephone Encounter (Signed)
I called and spoke with the pt and made her aware of the plan for [place] and [place] forms  She states fine to mail to her and she will fill out her portion and mail it back once complete I verified her address in Epic and it is correct  Devki, can you please advise if you already mailed her forms? I did not want to repeat if this was already done but I was not sure Let us know and we are happy to mail them to her, thanks!

## 2020-11-12 ENCOUNTER — Ambulatory Visit: Payer: Medicare Other | Admitting: Sports Medicine

## 2020-11-13 ENCOUNTER — Other Ambulatory Visit: Payer: Self-pay

## 2020-11-13 MED ORDER — BREZTRI AEROSPHERE 160-9-4.8 MCG/ACT IN AERO
2.0000 | INHALATION_SPRAY | Freq: Two times a day (BID) | RESPIRATORY_TRACT | 3 refills | Status: DC
Start: 2020-11-13 — End: 2021-01-28

## 2020-11-13 NOTE — Telephone Encounter (Signed)
Paperwork located and Rx printed. Place Dr. Agustina Caroli sign folder awaiting signature when he returns to clinic.

## 2020-11-14 ENCOUNTER — Telehealth: Payer: Self-pay | Admitting: Internal Medicine

## 2020-11-14 NOTE — Chronic Care Management (AMB) (Signed)
  Chronic Care Management   Outreach Note  11/14/2020 Name: Andrea Perry MRN: [id number] DOB: [birth year]  Referred by: Biagio Borg, MD Reason for referral : No chief complaint on file.   An unsuccessful telephone outreach was attempted today. The patient was referred to the pharmacist for assistance with care management and care coordination.   Follow Up Plan:   Lauretta Grill Upstream Scheduler

## 2020-11-15 ENCOUNTER — Ambulatory Visit: Payer: Medicare Other | Admitting: Sports Medicine

## 2020-11-16 ENCOUNTER — Other Ambulatory Visit: Payer: Self-pay | Admitting: Cardiovascular Disease

## 2020-11-18 ENCOUNTER — Encounter: Payer: Self-pay | Admitting: Gastroenterology

## 2020-11-22 NOTE — Telephone Encounter (Signed)
Received signed provider form for AZ&Me patient assistance app for [facility]. Will place back in Dr. Agustina Caroli box since we are waiting patient's portion. ATC patient to review if she has received and if she had question. Left VM requesting she complete app and return to clinic thereafter.  Please keep provider portion and patietn assistance application in Dr. Agustina Caroli folder as still awaiting patient's portion. F/u will be managed by nurse with Dr. Lamonte Sakai or triage team.  Knox Saliva, PharmD, MPH, BCPS Clinical Pharmacist (Rheumatology and Pulmonology)

## 2020-11-28 NOTE — Telephone Encounter (Signed)
Paperwork placed in Dr. Agustina Caroli box. LVM for pt to determine if application is completed. Asked pt to return call to office. Will hold on to papers until pt portion is returned.

## 2020-12-05 NOTE — Telephone Encounter (Signed)
ATC LVMTCB will close encounter at this time

## 2020-12-30 ENCOUNTER — Ambulatory Visit: Payer: Medicare Other | Admitting: Cardiovascular Disease

## 2021-01-09 ENCOUNTER — Encounter: Payer: Self-pay | Admitting: Internal Medicine

## 2021-01-09 ENCOUNTER — Ambulatory Visit (INDEPENDENT_AMBULATORY_CARE_PROVIDER_SITE_OTHER): Payer: Medicare Other | Admitting: Internal Medicine

## 2021-01-09 ENCOUNTER — Other Ambulatory Visit: Payer: Self-pay

## 2021-01-09 VITALS — BP 136/84 | HR 96 | Ht 62.0 in | Wt 262.0 lb

## 2021-01-09 DIAGNOSIS — E1165 Type 2 diabetes mellitus with hyperglycemia: Secondary | ICD-10-CM | POA: Diagnosis not present

## 2021-01-09 DIAGNOSIS — R300 Dysuria: Secondary | ICD-10-CM

## 2021-01-09 DIAGNOSIS — I10 Essential (primary) hypertension: Secondary | ICD-10-CM | POA: Diagnosis not present

## 2021-01-09 DIAGNOSIS — Z23 Encounter for immunization: Secondary | ICD-10-CM

## 2021-01-09 DIAGNOSIS — E039 Hypothyroidism, unspecified: Secondary | ICD-10-CM

## 2021-01-09 DIAGNOSIS — E785 Hyperlipidemia, unspecified: Secondary | ICD-10-CM | POA: Diagnosis not present

## 2021-01-09 DIAGNOSIS — Z1211 Encounter for screening for malignant neoplasm of colon: Secondary | ICD-10-CM

## 2021-01-09 DIAGNOSIS — E559 Vitamin D deficiency, unspecified: Secondary | ICD-10-CM

## 2021-01-09 LAB — BASIC METABOLIC PANEL
BUN: 18 mg/dL (ref 6–23)
CO2: 31 mEq/L (ref 19–32)
Calcium: 9.7 mg/dL (ref 8.4–10.5)
Chloride: 100 mEq/L (ref 96–112)
Creatinine, Ser: 0.96 mg/dL (ref 0.40–1.20)
GFR: 61 mL/min (ref >60.00)
Glucose, Bld: 118 mg/dL — ABNORMAL HIGH (ref 70–99)
Potassium: 4.1 mEq/L (ref 3.5–5.1)
Sodium: 141 mEq/L (ref 135–145)

## 2021-01-09 LAB — LIPID PANEL
Cholesterol: 166 mg/dL (ref 0–200)
HDL: 47 mg/dL (ref >39.00)
LDL Cholesterol: 80 mg/dL (ref 0–99)
NonHDL: 119.42
Total CHOL/HDL Ratio: 4
Triglycerides: 195 mg/dL — ABNORMAL HIGH (ref 0.0–149.0)
VLDL: 39 mg/dL (ref 0.0–40.0)

## 2021-01-09 LAB — URINALYSIS, ROUTINE W REFLEX MICROSCOPIC
Bilirubin Urine: NEGATIVE
Hgb urine dipstick: NEGATIVE
Nitrite: POSITIVE — AB
Specific Gravity, Urine: 1.01 (ref 1.000–1.030)
Total Protein, Urine: 30 — AB
Urine Glucose: NEGATIVE
Urobilinogen, UA: 0.2 (ref 0.0–1.0)
pH: 8.5 — AB (ref 5.0–8.0)

## 2021-01-09 LAB — TSH: TSH: 4.1 u[IU]/mL (ref 0.35–5.50)

## 2021-01-09 LAB — HEPATIC FUNCTION PANEL
ALT: 12 U/L (ref 0–35)
AST: 16 U/L (ref 0–37)
Albumin: 4.1 g/dL (ref 3.5–5.2)
Alkaline Phosphatase: 74 U/L (ref 39–117)
Bilirubin, Direct: 0.2 mg/dL (ref 0.0–0.3)
Total Bilirubin: 0.9 mg/dL (ref 0.2–1.2)
Total Protein: 7.4 g/dL (ref 6.0–8.3)

## 2021-01-09 LAB — MAGNESIUM: Magnesium: 2 mg/dL (ref 1.5–2.5)

## 2021-01-09 LAB — T4, FREE: Free T4: 1.01 ng/dL (ref 0.60–1.60)

## 2021-01-09 LAB — HEMOGLOBIN A1C: Hgb A1c MFr Bld: 6.5 % (ref 4.6–6.5)

## 2021-01-09 MED ORDER — CHOLECALCIFEROL 50 MCG (2000 UT) PO TABS: ORAL_TABLET | ORAL | 99 refills | Status: DC

## 2021-01-09 MED ORDER — ROSUVASTATIN CALCIUM 40 MG PO TABS: 40.0000 mg | ORAL_TABLET | Freq: Every day | ORAL | 3 refills | Status: DC

## 2021-01-09 NOTE — Patient Instructions (Addendum)
You had the flu shot today  You will be contacted regarding the referral for: cologuard  Ok to CHANGE the pravastatin to the crestor 40 mg per day  Please take OTC Vitamin D3 at 2000 units per day, indefinitely  Please continue all other medications as before, and refills have been done if requested.  Please have the pharmacy call with any other refills you may need.  Please continue your efforts at being more active, low cholesterol diet, and weight control.  Please keep your appointments with your specialists as you may have planned  Please go to the LAB at the blood drawing area for the tests to be done  You will be contacted by phone if any changes need to be made immediately.  Otherwise, you will receive a letter about your results with an explanation, but please check with MyChart first.  Please remember to sign up for MyChart if you have not done so, as this will be important to you in the future with finding out test results, communicating by private email, and scheduling acute appointments online when needed.  Please make an Appointment to return in 6 months, or sooner if needed

## 2021-01-09 NOTE — Progress Notes (Signed)
Patient ID: Andrea Perry, female   DOB: 1950/06/23, 70 y.o.   MRN: [id number]        Chief Complaint: follow up HTN, HLD and hyperglycemia, low vit d, dysuria, eleavted tsh, low magnesium       HPI:  Andrea Perry is a 70 y.o. female here overall doing ok, Pt denies chest pain, increased sob or doe, wheezing, orthopnea, PND, increased LE swelling, palpitations, dizziness or syncope.   Pt denies polydipsia, polyuria, or new focal neuro s/s.   Pt denies fever, wt loss, night sweats, loss of appetite, or other constitutional symptoms  Denies hyper or hypo thyroid symptoms such as voice, skin or hair change.   Has optho appt for dec 2022. To see cardiology next wk.  Also with 2-3 days onset dysuria but Denies urinary symptoms such as frequency, urgency, flank pain, hematuria or n/v, fever, chills.    Wt Readings from Last 3 Encounters:  01/09/21 262 lb (118.8 kg)  08/22/20 267 lb (121.1 kg)  08/20/20 258 lb (117 kg)   BP Readings from Last 3 Encounters:  01/09/21 136/84  08/22/20 118/70  08/20/20 133/74         Past Medical History:  Diagnosis Date   ALLERGIC RHINITIS 08/16/2008   ANXIETY 08/16/2008   Arthritis    hands, knees, lower back   ASTHMA 08/16/2008   Asthma    BRONCHITIS     DR. 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   GERD (gastroesophageal reflux disease)    H/O hiatal hernia    HEMATOCHEZIA 06/20/2009   Hemorrhoids    History of cardioversion 10/30/14   History of kidney stones    HYPERLIPIDEMIA 08/16/2008   HYPERTENSION 12/03/2006   Hypertension    Impaired glucose tolerance 09/21/2010   Overactive bladder 10/20/2015   Pneumonia    as a child   Pulmonary HTN ([place]) 06/19/2015   Shortness of breath dyspnea    Sleep apnea     MILD NO CPAP ORDERED   Snoring 06/19/2015   Past Surgical History:  Procedure Laterality Date   ABDOMINAL HYSTERECTOMY     APPENDECTOMY     CARDIOVERSION N/A 03/20/2014   Procedure: CARDIOVERSION;  Surgeon: Josue Hector, MD;  Location: [facility] ENDOSCOPY;  Service: Cardiovascular;  Laterality: N/A;   CARDIOVERSION N/A 10/30/2014   Procedure: CARDIOVERSION;  Surgeon: Josue Hector, MD;  Location: [facility] ENDOSCOPY;  Service: Cardiovascular;  Laterality: N/A;   CARDIOVERSION N/A 04/03/2019   Procedure: CARDIOVERSION;  Surgeon: Dorothy Spark, MD;  Location: [place];  Service: Cardiovascular;  Laterality: N/A;   CARPAL TUNNEL RELEASE     Right + LEFT   CESAREAN SECTION     x 1    COLONOSCOPY  2004   CYST EXCISION     RT ARM    CYSTOCELE REPAIR N/A 10/25/2012   Procedure: ANTERIOR REPAIR (CYSTOCELE);  Surgeon: Gus Height, MD;  Location: [place] [facility] [facility];  Service: Gynecology;  Laterality: N/A;   HEEL SPUR SURGERY Bilateral    KNEE SURGERY     Right + LEFT   LUMBAR LAMINECTOMY/DECOMPRESSION MICRODISCECTOMY N/A 03/19/2016   Procedure: Laminectomy and  Foraminotomy - Lumbar three-four - Lumbar four-five;  Surgeon: Eustace Moore, MD;  Location: [place];  Service: Neurosurgery;  Laterality: N/A;   RADIOLOGY WITH ANESTHESIA Right 11/01/2014   Procedure: MRI RIGHT FOREARM;  Surgeon: Medication Radiologist, MD;  Location: [place];  Service: Radiology;  Laterality: Right;   RADIOLOGY WITH ANESTHESIA Right 08/29/2015   Procedure: MRI - RIGHT FOREARM;  Surgeon: Medication Radiologist, MD;  Location: [place];  Service: Radiology;  Laterality: Right;   RADIOLOGY WITH ANESTHESIA N/A 12/05/2015   Procedure: MRI SPINE WITHOUT;  Surgeon: Medication Radiologist, MD;  Location: [place];  Service: Radiology;  Laterality: N/A;   SKIN CANCER EXCISION     BILAT SHOULDERS   SPLIT NIGHT STUDY  09/04/2015   TONSILLECTOMY AND ADENOIDECTOMY      reports that she quit smoking about 17 years ago. Her smoking use included cigarettes. She  has a 45.00 pack-year smoking history. She has never used smokeless tobacco. She reports current alcohol use of about 2.0 standard drinks per week. She reports that she does not use drugs. family history includes Colon polyps in her mother; Diabetes in her mother; Hyperlipidemia in her mother; Hypertension in her mother; Stroke in her father. Allergies  Allergen Reactions   Tramadol Hcl Nausea And Vomiting and Other (See Comments)     mouth dryness, headache   Codeine Nausea And Vomiting and Nausea Only   Tape Rash    Plastic tape, bandaids and ekg leads, causes redness and rash   Vicodin [Hydrocodone-Acetaminophen] Nausea And Vomiting   Current Outpatient Medications on File Prior to Visit  Medication Sig Dispense Refill   acetaminophen (TYLENOL) 500 MG tablet Take 500 mg by mouth every 6 (six) hours as needed for mild pain or moderate pain.     albuterol (PROVENTIL) (2.5 MG/3ML) 0.083% nebulizer solution Use every 4 hours with Ipratropium (Patient taking differently: Take 2.5 mg by nebulization every 4 (four) hours as needed for wheezing or shortness of breath.) 360 mL 12   albuterol (VENTOLIN HFA) 108 (90 Base) MCG/ACT inhaler Inhale 2 puffs by mouth every 6 hours as needed for wheezing or shortness of breath. 18 g 5   apixaban (ELIQUIS) 5 MG TABS tablet Take 1 tablet by mouth twice daily 60 tablet 5   Blood Glucose Monitoring Suppl (FREESTYLE LITE) DEVI Use as directed daily E11.9 1 each 0   Budeson-Glycopyrrol-Formoterol (BREZTRI AEROSPHERE) 160-9-4.8 MCG/ACT AERO Inhale 2 puffs into the lungs 2 (two) times daily. 32.1 g 3   cetirizine (ZYRTEC) 10 MG tablet Take 10 mg by mouth daily.     diphenoxylate-atropine (LOMOTIL) 2.5-0.025 MG tablet Take 1 tablet by mouth 4 (four) times daily as needed for diarrhea or loose stools. 40 tablet 0   escitalopram (LEXAPRO) 20 MG tablet Take 1 tablet by mouth once daily 90 tablet 1   estradiol (ESTRACE) 1 MG tablet Take 1 mg by mouth daily.     EUTHYROX  25 MCG tablet TAKE 1 TABLET BY MOUTH ONCE DAILY BEFORE BREAKFAST . APPOINTMENT REQUIRED FOR FUTURE REFILLS 90 tablet 1   furosemide (LASIX) 40 MG tablet Take 1 tablet by mouth once daily 90 tablet 3   glucose blood (FREESTYLE LITE) test strip Use as instructed once daily E11.9 100 each 12   guaiFENesin (MUCINEX) 600 MG 12 hr tablet Take 600 mg by mouth 2 (two) times daily as needed for cough.     ipratropium (ATROVENT) 0.02 % nebulizer solution Take 2.5 mLs (0.5 mg total) by nebulization 4 (  four) times daily. (Patient taking differently: Take 0.5 mg by nebulization every 4 (four) hours as needed for wheezing or shortness of breath.) 300 mL 12   irbesartan (AVAPRO) 150 MG tablet Take 1 tablet by mouth once daily 90 tablet 1   Lancets MISC Use as directed once daily E11.9 100 each 11   LORazepam (ATIVAN) 1 MG tablet Take 1 tablet by mouth twice daily as needed for anxiety 60 tablet 2   Magnesium Oxide 500 MG CAPS 1 tab by mouth twice per day 60 capsule 5   metFORMIN (GLUCOPHAGE-XR) 500 MG 24 hr tablet TAKE 2 TABLETS BY MOUTH ONCE DAILY WITH BREAKFAST 180 tablet 0   metoprolol tartrate (LOPRESSOR) 100 MG tablet Take 1 tablet by mouth twice daily 180 tablet 0   pantoprazole (PROTONIX) 40 MG tablet Take 1 tablet by mouth once daily 90 tablet 3   potassium chloride SA (KLOR-CON) 20 MEQ tablet Take 1 tablet (20 mEq total) by mouth daily. 90 tablet 3   tiZANidine (ZANAFLEX) 2 MG tablet Take 1 tablet (2 mg total) by mouth every 6 (six) hours as needed for muscle spasms. 60 tablet 2   calcium-vitamin D (OSCAL 500/200 D-3) 500-200 MG-UNIT tablet Take 1 tablet by mouth 2 (two) times daily. 60 tablet 2   No current facility-administered medications on file prior to visit.        ROS:  All others reviewed and negative.  Objective        PE:  BP 136/84 (BP Location: Right Arm, Patient Position: Sitting, Cuff Size: Large)   Pulse 96   Ht 5\' 2"  (1.575 m)   Wt 262 lb (118.8 kg)   SpO2 97%   BMI 47.92 kg/m                  Constitutional: Pt appears in NAD               HENT: Head: NCAT.                Right Ear: External ear normal.                 Left Ear: External ear normal.                Eyes: . Pupils are equal, round, and reactive to light. Conjunctivae and EOM are normal               Nose: without d/c or deformity               Neck: Neck supple. Gross normal ROM               Cardiovascular: Normal rate and regular rhythm.                 Pulmonary/Chest: Effort normal and breath sounds without rales or wheezing.                Abd:  Soft, NT, ND, + BS, no organomegaly               Neurological: Pt is alert. At baseline orientation, motor grossly intact               Skin: Skin is warm. No rashes, no other new lesions, LE edema - none               Psychiatric: Pt behavior is normal without agitation   Micro: none  Cardiac tracings I have personally interpreted today:  none  Pertinent Radiological findings (summarize): none   Lab Results  Component Value Date   WBC 9.2 07/09/2020   HGB 13.1 07/09/2020   HCT 39.0 07/09/2020   PLT 238.0 07/09/2020   GLUCOSE 118 (H) 01/09/2021   CHOL 166 01/09/2021   TRIG 195.0 (H) 01/09/2021   HDL 47.00 01/09/2021   LDLDIRECT 111.0 07/09/2020   LDLCALC 80 01/09/2021   ALT 12 01/09/2021   AST 16 01/09/2021   NA 141 01/09/2021   K 4.1 01/09/2021   CL 100 01/09/2021   CREATININE 0.96 01/09/2021   BUN 18 01/09/2021   CO2 31 01/09/2021   TSH 4.10 01/09/2021   INR 1.00 03/18/2016   HGBA1C 6.5 01/09/2021   MICROALBUR <0.7 07/09/2020   Assessment/Plan:  WILSON SAMPLE is a 70 y.o. White or Caucasian [1] female with  has a past medical history of ALLERGIC RHINITIS (08/16/2008), ANXIETY (08/16/2008), Arthritis, ASTHMA (08/16/2008), Asthma, Bladder leak, Cancer ([place]), Chronic lower back pain, COPD (08/16/2008), DEPRESSION (08/16/2008), Diabetes mellitus without complication ([place]), Dysrhythmia, Excessive daytime sleepiness (06/19/2015), GENITAL  HERPES (12/03/2006), GERD (gastroesophageal reflux disease), H/O hiatal hernia, HEMATOCHEZIA (06/20/2009), Hemorrhoids, History of cardioversion (10/30/14), History of kidney stones, HYPERLIPIDEMIA (08/16/2008), HYPERTENSION (12/03/2006), Hypertension, Impaired glucose tolerance (09/21/2010), Overactive bladder (10/20/2015), Pneumonia, Pulmonary HTN ([place]) (06/19/2015), Shortness of breath dyspnea, Sleep apnea, and Snoring (06/19/2015).  Vitamin D deficiency Last vitamin D Lab Results  Component Value Date   VD25OH 22.54 (L) 07/09/2020   Low, to start oral replacement   Hypothyroidism Lab Results  Component Value Date   TSH 4.10 01/09/2021   Stable, pt to continue levothyroxine   Hypomagnesemia Also for magnesium with labs,  to f/u any worsening symptoms or concerns   Essential hypertension BP Readings from Last 3 Encounters:  01/09/21 136/84  08/22/20 118/70  08/20/20 133/74   Stable, pt to continue medical treatment avapro, lopressor   Elevated lipids Lab Results  Component Value Date   [place] 80 01/09/2021   Uncontrolled, goal ldl < 70, pt to change pravastatin to crestor 40 qd, cont low chol diet   Diabetes Lab Results  Component Value Date   HGBA1C 6.5 01/09/2021   Stable, pt to continue current medical treatment metformin   Dysuria Exam benign, ok for urine studies  Followup: Return in about 6 months (around 07/09/2021).  Cathlean Cower, MD 01/12/2021 4:15 PM [facility]

## 2021-01-10 LAB — URINE CULTURE

## 2021-01-12 ENCOUNTER — Encounter: Payer: Self-pay | Admitting: Internal Medicine

## 2021-01-12 DIAGNOSIS — R300 Dysuria: Secondary | ICD-10-CM | POA: Insufficient documentation

## 2021-01-12 NOTE — Progress Notes (Deleted)
CARDIOLOGY OFFICE NOTE  Date:  01/12/2021    [name] Date of Birth: 02-10-1951 Medical Record #[id number]  PCP:  Biagio Borg, MD  Cardiologist:  Gillian Shields  No chief complaint on file.   History of Present Illness: Andrea Perry is a 70 y.o. female with a  history of diastolic dysfunction, DM, HTN, HLD and long standing symptomatic PAF - on anticoagulation  No history of CAD   - last Myoview from 2016 was normal.  - Echo 05/24/19 EF 55-60% normal atrial sizes normal RV no significant valve dx   Has failed [place] x 2 as well as flecainide She did not want to pursue hospitalization for Tikosyn or ablation and has been rate controlled on eliquis  Had weight gain and edema on Norvasc Improved when stopped . Complained to NP of some dysphagia on last visit  Seems to have clinical aspiration with coughing after eating frequently Ordered swallowing study June 2022 ***  No issues with afib No palpitations, syncope dyspnea or chest pain. Compliant with eliquis and no bleeding issues   Dyspnea sees [facility] ? COPD On Breztriand albuterol Former smoker with childhood asthma allergic rhinitis and ? OSA mild deferred Rx despite snoring heavily Had COVId February 2022 admitted 2/1-05/25/20     Past Medical History:  Diagnosis Date   ALLERGIC RHINITIS 08/16/2008   ANXIETY 08/16/2008   Arthritis    hands, knees, lower back   ASTHMA 08/16/2008   Asthma    BRONCHITIS     DR. 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   GERD (gastroesophageal reflux disease)    H/O hiatal hernia    HEMATOCHEZIA 06/20/2009   Hemorrhoids    History of cardioversion 10/30/14   History of kidney stones    HYPERLIPIDEMIA 08/16/2008   HYPERTENSION 12/03/2006    Hypertension    Impaired glucose tolerance 09/21/2010   Overactive bladder 10/20/2015   Pneumonia    as a child   Pulmonary HTN ([place]) 06/19/2015   Shortness of breath dyspnea    Sleep apnea    MILD NO CPAP ORDERED   Snoring 06/19/2015    Past Surgical History:  Procedure Laterality Date   ABDOMINAL HYSTERECTOMY     APPENDECTOMY     CARDIOVERSION N/A 03/20/2014   Procedure: CARDIOVERSION;  Surgeon: Josue Hector, MD;  Location: [facility] ENDOSCOPY;  Service: Cardiovascular;  Laterality: N/A;   CARDIOVERSION N/A 10/30/2014   Procedure: CARDIOVERSION;  Surgeon: Josue Hector, MD;  Location: [place];  Service: Cardiovascular;  Laterality: N/A;   CARDIOVERSION N/A 04/03/2019   Procedure: CARDIOVERSION;  Surgeon: Dorothy Spark, MD;  Location: [place];  Service: Cardiovascular;  Laterality: N/A;   CARPAL TUNNEL RELEASE     Right + LEFT   CESAREAN SECTION     x 1    COLONOSCOPY  2004   CYST EXCISION     RT ARM    CYSTOCELE REPAIR N/A 10/25/2012   Procedure: ANTERIOR REPAIR (CYSTOCELE);  Surgeon: Gus Height, MD;  Location: [place] [facility];  Service: Gynecology;  Laterality: N/A;   HEEL SPUR SURGERY Bilateral  KNEE SURGERY     Right + LEFT   LUMBAR LAMINECTOMY/DECOMPRESSION MICRODISCECTOMY N/A 03/19/2016   Procedure: Laminectomy and Foraminotomy - Lumbar three-four - Lumbar four-five;  Surgeon: Eustace Moore, MD;  Location: [place];  Service: Neurosurgery;  Laterality: N/A;   RADIOLOGY WITH ANESTHESIA Right 11/01/2014   Procedure: MRI RIGHT FOREARM;  Surgeon: Medication Radiologist, MD;  Location: [place];  Service: Radiology;  Laterality: Right;   RADIOLOGY WITH ANESTHESIA Right 08/29/2015   Procedure: MRI - RIGHT FOREARM;  Surgeon: Medication Radiologist, MD;  Location: [place];  Service: Radiology;  Laterality: Right;   RADIOLOGY WITH ANESTHESIA N/A 12/05/2015   Procedure: MRI SPINE WITHOUT;  Surgeon: Medication Radiologist, MD;  Location: [place];  Service: Radiology;  Laterality: N/A;   SKIN  CANCER EXCISION     BILAT SHOULDERS   SPLIT NIGHT STUDY  09/04/2015   TONSILLECTOMY AND ADENOIDECTOMY       Medications: No outpatient medications have been marked as taking for the 01/16/21 encounter (Appointment) with Josue Hector, MD.    Allergies: Allergies  Allergen Reactions   Tramadol Hcl Nausea And Vomiting and Other (See Comments)     mouth dryness, headache   Codeine Nausea And Vomiting and Nausea Only   Tape Rash    Plastic tape, bandaids and ekg leads, causes redness and rash   Vicodin [Hydrocodone-Acetaminophen] Nausea And Vomiting    Social History: The patient  reports that she quit smoking about 17 years ago. Her smoking use included cigarettes. She has a 45.00 pack-year smoking history. She has never used smokeless tobacco. She reports current alcohol use of about 2.0 standard drinks per week. She reports that she does not use drugs.   Family History: The patient's family history includes Colon polyps in her mother; Diabetes in her mother; Hyperlipidemia in her mother; Hypertension in her mother; Stroke in her father.   Review of Systems: Please see the history of present illness.   All other systems are reviewed and negative.   Physical Exam: VS:  There were no vitals taken for this visit. Marland Kitchen  BMI There is no height or weight on file to calculate BMI.  Wt Readings from Last 3 Encounters:  01/09/21 118.8 kg  08/22/20 121.1 kg  08/20/20 117 kg   Affect appropriate Overweight white female  HEENT: normal Neck supple with no adenopathy JVP normal no bruits no thyromegaly Lungs clear with no wheezing and good diaphragmatic motion Heart:  S1/S2 no murmur, no rub, gallop or click PMI normal Abdomen: benighn, BS positve, no tenderness, no AAA no bruit.  No HSM or HJR Distal pulses intact with no bruits No edema Neuro non-focal Skin warm and dry No muscular weakness    LABORATORY DATA:  EKG:   06/06/19 afib rate 99 nonspecific ST changes   Lab  Results  Component Value Date   WBC 9.2 07/09/2020   HGB 13.1 07/09/2020   HCT 39.0 07/09/2020   PLT 238.0 07/09/2020   GLUCOSE 118 (H) 01/09/2021   CHOL 166 01/09/2021   TRIG 195.0 (H) 01/09/2021   HDL 47.00 01/09/2021   LDLDIRECT 111.0 07/09/2020   LDLCALC 80 01/09/2021   ALT 12 01/09/2021   AST 16 01/09/2021   NA 141 01/09/2021   K 4.1 01/09/2021   CL 100 01/09/2021   CREATININE 0.96 01/09/2021   BUN 18 01/09/2021   CO2 31 01/09/2021   TSH 4.10 01/09/2021   INR 1.00 03/18/2016   HGBA1C 6.5 01/09/2021   MICROALBUR <0.7  07/09/2020     BNP (last 3 results) Recent Labs    05/21/20 0928 06/13/20 1806  BNP 152.0* 199.9*    ProBNP (last 3 results) Recent Labs    07/09/20 1441  PROBNP 244.0*     Other Studies Reviewed Today:  ECHO IMPRESSIONS 05/2019   1. Left ventricular ejection fraction, by visual estimation, is 55 to  60%. The left ventricle has normal function. There is no left ventricular  hypertrophy.   2. Left ventricular diastolic parameters are indeterminate.   3. Global right ventricle has normal systolic function.The right  ventricular size is normal.   4. Left atrial size was normal.   5. Right atrial size was normal.   6. Mild mitral annular calcification.   7. The mitral valve is normal in structure. No evidence of mitral valve  regurgitation.   8. The tricuspid valve is normal in structure.   9. The tricuspid valve is normal in structure. Tricuspid valve  regurgitation is not demonstrated.  10. The aortic valve was not well visualized. Aortic valve regurgitation  is not visualized. No evidence of aortic valve stenosis.  11. The pulmonic valve was not well visualized. Pulmonic valve  regurgitation is not visualized.  12. The inferior vena cava is dilated in size with <50% respiratory  variability, suggesting right atrial pressure of 15 mmHg.  13. TR signal is inadequate for assessing pulmonary artery systolic  pressure.         Procedure: DC Cardioversion 03/2019 Indications: atrial fibrillation   Procedure Details Consent: Obtained Time Out: Verified patient identification, verified procedure, site/side was marked, verified correct patient position, special equipment/implants available, Radiology Safety Procedures followed,  medications/allergies/relevent history reviewed, required imaging and test results available.  Performed   The patient has been on adequate anticoagulation.  The patient received IV propofol administered by anesthesia staff for deep sedation.  Synchronous cardioversion was performed at 120 joules.   The cardioversion was successful.     Complications: No apparent complications Patient did tolerate procedure well.     [name], MD, [facility] 04/03/2019, 10:38 AM     ASSESSMENT & PLAN:     1 Persistent AF - has opted for rate control and continued anticoagulation with no plans for further attempts at restoration back to NSR. HR is fine today. Continue eliquis and lopressor   2. HTN - improved norvasc d/c due to swelling   3. Dysphagia - ordered swallowing study f/u with GI or primary after results   4. Probable diastolic dysfunction - she has good BP control - weight trending down. On daily lasix and Kdur   5. HLD -  On pravastatin labs with primary   6. Dyspnea:  multifactorial ? Aspiration, COPD, diastolic dysfunction worse with elevated afib rates COVID February 2022 ***   Current medicines are reviewed with the patient today.  The patient does not have concerns regarding medicines other than what has been noted above.  The following changes have been made:  See above.  Labs/ tests ordered today include: Swallowing Study    No orders of the defined types were placed in this encounter.    Disposition:   F/U in a year  Patient is agreeable to this plan and will call if any problems develop in the interim.   Signed: Jenkins Rouge, MD  01/12/2021 11:23 AM  [facility] [address] [place], [place]  [address] Phone: [phone] Fax: [phone]

## 2021-01-12 NOTE — Assessment & Plan Note (Signed)
BP Readings from Last 3 Encounters:  01/09/21 136/84  08/22/20 118/70  08/20/20 133/74   Stable, pt to continue medical treatment avapro, lopressor

## 2021-01-12 NOTE — Assessment & Plan Note (Signed)
Last vitamin D Lab Results  Component Value Date   VD25OH 22.54 (L) 07/09/2020   Low, to start oral replacement

## 2021-01-12 NOTE — Assessment & Plan Note (Signed)
Also for magnesium with labs,  to f/u any worsening symptoms or concerns

## 2021-01-12 NOTE — Assessment & Plan Note (Signed)
Exam benign, ok for urine studies

## 2021-01-12 NOTE — Assessment & Plan Note (Signed)
Lab Results  Component Value Date   TSH 4.10 01/09/2021   Stable, pt to continue levothyroxine

## 2021-01-12 NOTE — Assessment & Plan Note (Signed)
Lab Results  Component Value Date   LDLCALC 80 01/09/2021   Uncontrolled, goal ldl < 70, pt to change pravastatin to crestor 40 qd, cont low chol diet

## 2021-01-12 NOTE — Assessment & Plan Note (Signed)
Lab Results  Component Value Date   HGBA1C 6.5 01/09/2021   Stable, pt to continue current medical treatment metformin

## 2021-01-16 ENCOUNTER — Ambulatory Visit: Payer: Medicare Other | Admitting: Cardiovascular Disease

## 2021-01-28 ENCOUNTER — Other Ambulatory Visit: Payer: Self-pay

## 2021-01-28 ENCOUNTER — Ambulatory Visit (INDEPENDENT_AMBULATORY_CARE_PROVIDER_SITE_OTHER): Payer: Medicare Other | Admitting: Emergency Medicine

## 2021-01-28 ENCOUNTER — Encounter: Payer: Self-pay | Admitting: Emergency Medicine

## 2021-01-28 DIAGNOSIS — J301 Allergic rhinitis due to pollen: Secondary | ICD-10-CM

## 2021-01-28 DIAGNOSIS — R0602 Shortness of breath: Secondary | ICD-10-CM

## 2021-01-28 DIAGNOSIS — J438 Other emphysema: Secondary | ICD-10-CM

## 2021-01-28 DIAGNOSIS — I272 Pulmonary hypertension, unspecified: Secondary | ICD-10-CM | POA: Diagnosis not present

## 2021-01-28 MED ORDER — BREZTRI AEROSPHERE 160-9-4.8 MCG/ACT IN AERO: 2.0000 | INHALATION_SPRAY | Freq: Two times a day (BID) | RESPIRATORY_TRACT | 0 refills | Status: DC

## 2021-01-28 MED ORDER — BREZTRI AEROSPHERE 160-9-4.8 MCG/ACT IN AERO: 2.0000 | INHALATION_SPRAY | Freq: Two times a day (BID) | RESPIRATORY_TRACT | 3 refills | Status: DC

## 2021-01-28 NOTE — Assessment & Plan Note (Signed)
Significant dyspnea on exertion, multifactorial in part due to her COPD, underlying heart disease.  Certainly a significant component of deconditioning.  She did not desaturate on ambulation last visit.  She may benefit from pulmonary rehab going forward.

## 2021-01-28 NOTE — Assessment & Plan Note (Signed)
Overall intact RV size and function on TTE from 05/22/2020

## 2021-01-28 NOTE — Progress Notes (Signed)
Subjective:    Patient ID: Andrea Perry, female    DOB: [birth year], 70 y.o.   MRN: [id number]  HPI  ROV 08/22/20 --70 year old former smoker with asthmatic COPD and mild peripheral eosinophilia, allergic rhinitis, atrial fibrillation, diabetes, hypertension and secondary pulmonary hypertension.  She follows up today after COVID-19 infection in February 3500 that was complicated by atrial fibrillation with RVR and decompensated CHF.  She is on Lasix 40 mg once daily, metoprolol Currently managed on Breztri.  She has albuterol and uses approximately 1-2x a day. She is using atrovent / albuterol nebs 4-5x a day also. She intermittently has bad days with increased exertional SOB. Trouble doing housework. She did not take her lasix today.  On Zyrtec, Protonix once daily, Mucinex as needed  ROV 01/28/21 --follow-up visit for 70 year old woman with a history of COPD with asthma component.  She has documented mild peripheral eosinophilia with allergic rhinitis.  Past medical history also significant for hypertension, A. fib, diabetes and secondary pulmonary hypertension. We have been managing her on Breztri, but she needs pt assistance and has been out of it for 2 months.  At her last visit in May she had lower extremity edema and increased dyspnea and I treated her with some extra Lasix.  A walking oximetry did not show desaturation although her walk distance was short.  She is using DuoNeb as needed, about every 5-6 hours. She does believe that she is benefiting from it.  She has nasal congestion and obstruction, on zyrtec.   TTE 05/22/2020 with EF 50-55%, indeterminate diastolic parameters, normal RV size and function with estimated RVSP 34 mmHg   Review of Systems  Constitutional:  Negative for fever and unexpected weight change.  HENT:  Negative for congestion, dental problem, ear pain, nosebleeds, postnasal drip, rhinorrhea, sinus pressure, sneezing, sore throat and trouble swallowing.   Eyes:   Negative for redness and itching.  Respiratory:  Positive for cough and shortness of breath. Negative for chest tightness and wheezing.   Cardiovascular:  Negative for palpitations and leg swelling.  Gastrointestinal:  Negative for nausea and vomiting.  Genitourinary:  Negative for dysuria.  Musculoskeletal:  Negative for joint swelling.  Skin:  Negative for rash.  Neurological:  Negative for headaches.  Hematological:  Does not bruise/bleed easily.  Psychiatric/Behavioral:  Negative for dysphoric mood. The patient is not nervous/anxious.       Objective:   Physical Exam Vitals:   01/28/21 1453  BP: 126/82  Pulse: 78  Temp: 98.2 F (36.8 C)  TempSrc: Oral  SpO2: 96%  Weight: 273 lb (123.8 kg)  Height: 5\' 2"  (1.575 m)   Gen: Pleasant, overwt,  in no distress,  normal affect  ENT: No lesions,  mouth clear,  oropharynx clear, no postnasal drip  Neck: No JVD, no stridor  Lungs: No use of accessory muscles, clear, distant at both bases  Cardiovascular: RRR, heart sounds normal, no murmur or gallops, bilateral ankle peripheral edema  Musculoskeletal: No deformities, bilateral foot cyanosis, venous insufficiency  Neuro: alert, non focal  Skin: Warm, no lesions or rashes      Assessment & Plan:  COPD (chronic obstructive pulmonary disease) (HCC) Worsened since she came off the [place].  Needs financial assistance for this through [place].  We have filled out the paperwork, still need for her to fill out her component.  We will work on this today.  We will give her samples to tide her over for now.  Continue DuoNeb up to every  6 hours as needed.  Allergic rhinitis Significant nasal congestion and nasal obstruction.  She is on Zyrtec, will have to take his own nasal spray  Pulmonary HTN (HCC) Overall intact RV size and function on TTE from 05/22/2020  Shortness of breath Significant dyspnea on exertion, multifactorial in part due to her COPD, underlying heart disease.   Certainly a significant component of deconditioning.  She did not desaturate on ambulation last visit.  She may benefit from pulmonary rehab going forward.  Baltazar Apo, MD, PhD 01/28/2021, 3:12 PM [facility] [phone] or if no answer [phone]

## 2021-01-28 NOTE — Addendum Note (Signed)
Addended by: Gavin Potters R on: 01/28/2021 03:29 PM   Modules accepted: Orders

## 2021-01-28 NOTE — Assessment & Plan Note (Signed)
Worsened since she came off the [place].  Needs financial assistance for this through [place].  We have filled out the paperwork, still need for her to fill out her component.  We will work on this today.  We will give her samples to tide her over for now.  Continue DuoNeb up to every 6 hours as needed.

## 2021-01-28 NOTE — Assessment & Plan Note (Signed)
Significant nasal congestion and nasal obstruction.  She is on Zyrtec, will have to take his own nasal spray

## 2021-01-28 NOTE — Patient Instructions (Addendum)
We need to get you back on your Breztri 2 puffs twice a day.  Rinse and gargle after using.  We will work on filling out patient assistance paperwork with [employer] Continue to use your DuoNeb up to every 6 hours when you need it for shortness of breath Continue Zyrtec once daily Try starting fluticasone nasal spray, 2 sprays each nostril once daily Follow with Dr. Lamonte Sakai in 2 months or sooner if you have any problems.

## 2021-02-02 ENCOUNTER — Other Ambulatory Visit: Payer: Self-pay | Admitting: Internal Medicine

## 2021-02-02 NOTE — Telephone Encounter (Signed)
Please refill as per office routine med refill policy (all routine meds to be refilled for 3 mo or monthly (per pt preference) up to one year from last visit, then month to month grace period for 3 mo, then further med refills will have to be denied) ? ?

## 2021-02-05 ENCOUNTER — Telehealth: Payer: Self-pay | Admitting: Pharmacy Technician

## 2021-02-05 ENCOUNTER — Other Ambulatory Visit (HOSPITAL_COMMUNITY): Payer: Self-pay

## 2021-02-05 NOTE — Telephone Encounter (Signed)
Patient Advocate Encounter  Received notification from [place] that prior authorization for BREZTRI is required.   PRIOR AUTHORIZATION IS NOT REQUIRED: Ran test claim, pt is in coverage gap, co-pay is $153.33  Luciano Cutter, CPhT Patient Advocate Phone: [phone] Fax:  [phone]

## 2021-02-18 ENCOUNTER — Telehealth: Payer: Self-pay | Admitting: Internal Medicine

## 2021-02-18 NOTE — Telephone Encounter (Signed)
LVM for pt to rtn y call to schedule AWV with [facility]. Please schedule AWV with [facility] if pt calls the office.

## 2021-02-21 ENCOUNTER — Telehealth: Payer: Self-pay | Admitting: Emergency Medicine

## 2021-03-07 NOTE — Telephone Encounter (Signed)
[facility] [employer], was placed on long hold. Will attempt to get update on status at later time. Dr. Lamonte Sakai may we provide with sample until we can reach [employer]?

## 2021-03-07 NOTE — Telephone Encounter (Signed)
Will forward to [place] to follow up on pt assistance forms.

## 2021-03-09 NOTE — Telephone Encounter (Signed)
Yes please

## 2021-03-10 MED ORDER — BREZTRI AEROSPHERE 160-9-4.8 MCG/ACT IN AERO
2.0000 | INHALATION_SPRAY | Freq: Two times a day (BID) | RESPIRATORY_TRACT | 0 refills | Status: DC
Start: 2021-03-10 — End: 2021-04-01

## 2021-03-10 NOTE — Telephone Encounter (Signed)
I called to leave a message to let her know about a sample until we can reach her patient assistance.  Waiting on a call back.

## 2021-03-10 NOTE — Telephone Encounter (Signed)
I have called and LM on VM for the pt to call our office back.

## 2021-03-10 NOTE — Telephone Encounter (Signed)
I called the patient and she is agreeable to a sample of the [place] and she will pick up the sample. Nothing further needed. Sample placed up front for pick up.

## 2021-03-27 ENCOUNTER — Other Ambulatory Visit: Payer: Self-pay | Admitting: Cardiovascular Disease

## 2021-04-01 ENCOUNTER — Ambulatory Visit (INDEPENDENT_AMBULATORY_CARE_PROVIDER_SITE_OTHER): Payer: Medicare Other | Admitting: Emergency Medicine

## 2021-04-01 ENCOUNTER — Other Ambulatory Visit: Payer: Self-pay

## 2021-04-01 ENCOUNTER — Encounter: Payer: Self-pay | Admitting: Emergency Medicine

## 2021-04-01 DIAGNOSIS — R053 Chronic cough: Secondary | ICD-10-CM

## 2021-04-01 DIAGNOSIS — J438 Other emphysema: Secondary | ICD-10-CM | POA: Diagnosis not present

## 2021-04-01 DIAGNOSIS — J301 Allergic rhinitis due to pollen: Secondary | ICD-10-CM

## 2021-04-01 MED ORDER — BREZTRI AEROSPHERE 160-9-4.8 MCG/ACT IN AERO
2.0000 | INHALATION_SPRAY | Freq: Two times a day (BID) | RESPIRATORY_TRACT | 0 refills | Status: DC
Start: 2021-04-01 — End: 2021-05-27

## 2021-04-01 NOTE — Assessment & Plan Note (Signed)
No breakthrough GERD symptoms.  Suspect a component of breakthrough rhinitis symptoms contributing to cough.  Please continue your Zyrtec once daily Use Mucinex if you need it for your cough. Try starting over-the-counter fluticasone nasal spray, 2 sprays each nostril once daily Continue your pantoprazole (Protonix) as you have been taking it

## 2021-04-01 NOTE — Progress Notes (Signed)
Subjective:    Patient ID: Andrea Perry, female    DOB: 01/09/1951, 70 y.o.   MRN: [id number]  HPI  ROV 01/28/21 --follow-up visit for 70 year old woman with a history of COPD with asthma component.  She has documented mild peripheral eosinophilia with allergic rhinitis.  Past medical history also significant for hypertension, A. fib, diabetes and secondary pulmonary hypertension. We have been managing her on Breztri, but she needs pt assistance and has been out of it for 2 months.  At her last visit in May she had lower extremity edema and increased dyspnea and I treated her with some extra Lasix.  A walking oximetry did not show desaturation although her walk distance was short.  She is using DuoNeb as needed, about every 5-6 hours. She does believe that she is benefiting from it.  She has nasal congestion and obstruction, on zyrtec.   TTE 05/22/2020 with EF 50-55%, indeterminate diastolic parameters, normal RV size and function with estimated RVSP 34 mmHg  ROV 04/01/21 --70 year old woman with history of COPD and reversibility/asthma component.  She has mild peripheral eosinophilia with associated allergic rhinitis.  Other past medical history significant for atrial fibrillation, hypertension, diabetes, secondary pulmonary hypertension.  She has benefited from [place] but has had difficulty getting it, needed a prior authorization.  We attempted to get assistance through [employer].  Today she reports that we still don't have this confirmed - she would need to  On Zyrtec daily, Mucinex as needed.  Uses albuterol / atrovent approximately 4x a day. She believes that she benefits.  Protonix once daily She reports that she has been having more dry cough over the last week or two. No sputum.  Her exertional shortness of breath is stable, still bothers her when she walks through the house. She is able to get around the house at a slow pace, is able to do some of her house work.    Review of Systems   Constitutional:  Negative for fever and unexpected weight change.  HENT:  Negative for congestion, dental problem, ear pain, nosebleeds, postnasal drip, rhinorrhea, sinus pressure, sneezing, sore throat and trouble swallowing.   Eyes:  Negative for redness and itching.  Respiratory:  Positive for cough and shortness of breath. Negative for chest tightness and wheezing.   Cardiovascular:  Negative for palpitations and leg swelling.  Gastrointestinal:  Negative for nausea and vomiting.  Genitourinary:  Negative for dysuria.  Musculoskeletal:  Negative for joint swelling.  Skin:  Negative for rash.  Neurological:  Negative for headaches.  Hematological:  Does not bruise/bleed easily.  Psychiatric/Behavioral:  Negative for dysphoric mood. The patient is not nervous/anxious.       Objective:   Physical Exam Vitals:   04/01/21 1343  BP: 134/74  Pulse: 93  Temp: 98.2 F (36.8 C)  TempSrc: Oral  SpO2: 97%  Weight: 275 lb 6.4 oz (124.9 kg)  Height: 5\' 2"  (1.575 m)   Gen: Pleasant, overwt,  in no distress,  normal affect  ENT: No lesions,  mouth clear,  oropharynx clear, no postnasal drip  Neck: No JVD, no stridor  Lungs: No use of accessory muscles, clear, distant at both bases  Cardiovascular: RRR, heart sounds normal, no murmur or gallops, bilateral ankle peripheral edema  Musculoskeletal: No deformities, bilateral foot cyanosis, venous insufficiency  Neuro: alert, non focal  Skin: ankles cool, no lesions or rashes      Assessment & Plan:  COPD (chronic obstructive pulmonary disease) (HCC) We will plan  to continue Breztri 2 puffs twice a day.  Rinse and gargle after using.  We will need to fill out new patients assistance paperwork with [employer] in January 2023. Okay to use your albuterol/ipratropium nebulizer treatments up to every 6 hours as you need them for shortness of breath, chest tightness, wheezing. We will work on trying to get you new tubing and mouthpiece  for your nebulizer machine Follow with [employer] in January 2023 so we can work on getting your financial assistance paperwork done Follow Dr. Lamonte Sakai in 6 months or sooner if you have any problems.  Allergic rhinitis Suspect persistent symptoms are contributing to her intermittent dry cough.  Will try adding fluticasone nasal spray to her existing Zyrtec  Cough No breakthrough GERD symptoms.  Suspect a component of breakthrough rhinitis symptoms contributing to cough.  Please continue your Zyrtec once daily Use Mucinex if you need it for your cough. Try starting over-the-counter fluticasone nasal spray, 2 sprays each nostril once daily Continue your pantoprazole (Protonix) as you have been taking it  Baltazar Apo, MD, PhD 04/01/2021, 2:00 PM [facility] [phone] or if no answer [phone]

## 2021-04-01 NOTE — Addendum Note (Signed)
Addended by: Gavin Potters R on: 04/01/2021 02:49 PM   Modules accepted: Orders

## 2021-04-01 NOTE — Patient Instructions (Addendum)
We will plan to continue Breztri 2 puffs twice a day.  Rinse and gargle after using.  We will need to fill out new patients assistance paperwork with [employer] in January 2023. Okay to use your albuterol/ipratropium nebulizer treatments up to every 6 hours as you need them for shortness of breath, chest tightness, wheezing. We will work on trying to get you new tubing and mouthpiece for your nebulizer machine Please continue your Zyrtec once daily Use Mucinex if you need it for your cough. Try starting over-the-counter fluticasone nasal spray, 2 sprays each nostril once daily Continue your pantoprazole (Protonix) as you have been taking it Follow with APP in January 2023 so we can work on getting your financial assistance paperwork done Follow Dr. Lamonte Sakai in 6 months or sooner if you have any problems.

## 2021-04-01 NOTE — Assessment & Plan Note (Signed)
We will plan to continue Breztri 2 puffs twice a day.  Rinse and gargle after using.  We will need to fill out new patients assistance paperwork with [employer] in January 2023. Okay to use your albuterol/ipratropium nebulizer treatments up to every 6 hours as you need them for shortness of breath, chest tightness, wheezing. We will work on trying to get you new tubing and mouthpiece for your nebulizer machine Follow with APP in January 2023 so we can work on getting your financial assistance paperwork done Follow Dr. Lamonte Sakai in 6 months or sooner if you have any problems.

## 2021-04-01 NOTE — Assessment & Plan Note (Signed)
Suspect persistent symptoms are contributing to her intermittent dry cough.  Will try adding fluticasone nasal spray to her existing Zyrtec

## 2021-04-08 ENCOUNTER — Other Ambulatory Visit: Payer: Self-pay | Admitting: Internal Medicine

## 2021-04-08 NOTE — Telephone Encounter (Signed)
Please refill as per office routine med refill policy (all routine meds to be refilled for 3 mo or monthly (per pt preference) up to one year from last visit, then month to month grace period for 3 mo, then further med refills will have to be denied) ? ?

## 2021-04-17 ENCOUNTER — Other Ambulatory Visit: Payer: Self-pay | Admitting: Emergency Medicine

## 2021-05-05 ENCOUNTER — Ambulatory Visit (INDEPENDENT_AMBULATORY_CARE_PROVIDER_SITE_OTHER): Payer: Medicare Other | Admitting: Primary Care

## 2021-05-05 ENCOUNTER — Other Ambulatory Visit: Payer: Self-pay

## 2021-05-05 ENCOUNTER — Encounter: Payer: Self-pay | Admitting: Primary Care

## 2021-05-05 DIAGNOSIS — J438 Other emphysema: Secondary | ICD-10-CM

## 2021-05-05 DIAGNOSIS — I5030 Unspecified diastolic (congestive) heart failure: Secondary | ICD-10-CM

## 2021-05-05 NOTE — Patient Instructions (Signed)
Nice seeing you today Ms Weier  Your COPD seems to be well controlled with [name] and you have no evidence of acute exacerbation on exam today. I do agree and think your dyspnea symptoms are related to not taking your fluid pill today. If you taken fluid pill as scheduled and breathing does not improve please contact us or your cardiologist  Recommendations: Continue Breztri aerosphere two puffs morning and evening  Use ipratropium-albuterol nebulizer every 6 hours as needed for shortness of breath/wheezing  Follow-up: Recall should already be in for June 2023 with Dr. Lamonte Sakai (please double check at front desk)  Call sooner if you run out of inhaler or you develop purulent cough, increased chest tightness/wheezing or shortness of breath

## 2021-05-05 NOTE — Assessment & Plan Note (Addendum)
COPD seems to be well controlled with Breztri, no evidence of acute exacerbation on exam today. Lungs were clear without wheezing. I think her intermittent dyspnea symptoms are related to her not taking diuretic today. She is receiving Breztri through patient assistance without issues. Due for follow-up with Dr. Lamonte Sakai in June 2023.   Recommendations: - Continue Breztri aerosphere two puffs morning and evening  - Use ipratropium-albuterol nebulizer every 6 hours as needed for shortness of breath/wheezing

## 2021-05-05 NOTE — Assessment & Plan Note (Signed)
-   Mild intermittent dyspnea symptoms with + 2 pedal edema. Advised patient to take lasix 40mg  as scheduled, if no improvement notify our office or cardiology/PCP

## 2021-05-05 NOTE — Progress Notes (Signed)
@Patient  ID: Andrea Perry, female    DOB: 1951/01/28, 71 y.o.   MRN: [id number]  Chief Complaint  Patient presents with   Follow-up    Patient has no complaints. Patient says everything is going good for her.    Referring provider: Biagio Borg, MD  HPI: 71 year old female, former smoker. PMH significant for HTN, afib, pulm HTN, COPD/asthma, emphysema, allergic rhinitis, GERD, hypothyroidism. Patient of Dr. Lamonte Sakai, last seen on 04/01/21.    Previous [facility] pulmonary encounter: ROV 01/28/21 --follow-up visit for 71 year old woman with a history of COPD with asthma component.  She has documented mild peripheral eosinophilia with allergic rhinitis.  Past medical history also significant for hypertension, A. fib, diabetes and secondary pulmonary hypertension. We have been managing her on Breztri, but she needs pt assistance and has been out of it for 2 months.  At her last visit in May she had lower extremity edema and increased dyspnea and I treated her with some extra Lasix.  A walking oximetry did not show desaturation although her walk distance was short.  She is using DuoNeb as needed, about every 5-6 hours. She does believe that she is benefiting from it.  She has nasal congestion and obstruction, on zyrtec.   TTE 05/22/2020 with EF 50-55%, indeterminate diastolic parameters, normal RV size and function with estimated RVSP 34 mmHg  ROV 04/01/21 --71 year old woman with history of COPD and reversibility/asthma component.  She has mild peripheral eosinophilia with associated allergic rhinitis.  Other past medical history significant for atrial fibrillation, hypertension, diabetes, secondary pulmonary hypertension.  She has benefited from [place] but has had difficulty getting it, needed a prior authorization.  We attempted to get assistance through [employer].  Today she reports that we still don't have this confirmed - she would need to  On Zyrtec daily, Mucinex as needed.  Uses albuterol /  atrovent approximately 4x a day. She believes that she benefits.  Protonix once daily She reports that she has been having more dry cough over the last week or two. No sputum.  Her exertional shortness of breath is stable, still bothers her when she walks through the house. She is able to get around the house at a slow pace, is able to do some of her house work.    05/05/2021- interim hx  Patient presents today for 1 month follow-up/medication compliance. No changes in chronic respiratory symptoms since last visit. She has good days and bad days. Today she is some more short winded. She has not taken her fluid pill today because of her appointment with [place]. No new or significant cough. She has a cough first thing in the morning. She has been compliant with [employer]. No issues getting medication, she has been approved for assistance and receiving through the mail. She is using ipratropium-albuterol three times a day.    Allergies  Allergen Reactions   Tramadol Hcl Nausea And Vomiting and Other (See Comments)     mouth dryness, headache   Codeine Nausea And Vomiting and Nausea Only   Tape Rash    Plastic tape, bandaids and ekg leads, causes redness and rash   Vicodin [Hydrocodone-Acetaminophen] Nausea And Vomiting    Immunization History  Administered Date(s) Administered   Fluad Quad(high Dose 65+) 02/09/2019, 06/11/2020, 01/09/2021   Influenza, High Dose Seasonal PF 03/23/2018   Influenza,inj,Quad PF,6+ Mos 03/01/2013, 02/14/2014, 03/08/2015, 04/16/2016, 01/12/2017   Influenza-Unspecified 01/18/2017   Pneumococcal Conjugate-13 03/30/2013   Pneumococcal Polysaccharide-23 04/04/2014, 07/09/2020  Tdap 09/24/2010    Past Medical History:  Diagnosis Date   ALLERGIC RHINITIS 08/16/2008   ANXIETY 08/16/2008   Arthritis    hands, knees, lower back   ASTHMA 08/16/2008   Asthma    BRONCHITIS     DR. Lamonte Sakai     Bladder leak    Cancer (HCC)    MELANOMA       Chronic lower back pain     problems with disc L2-5   COPD 08/16/2008   DEPRESSION 08/16/2008   Diabetes mellitus without complication ([place])    Dysrhythmia    hx AF   Excessive daytime sleepiness 06/19/2015   GENITAL HERPES 12/03/2006   GERD (gastroesophageal reflux disease)    H/O hiatal hernia    HEMATOCHEZIA 06/20/2009   Hemorrhoids    History of cardioversion 10/30/14   History of kidney stones    HYPERLIPIDEMIA 08/16/2008   HYPERTENSION 12/03/2006   Hypertension    Impaired glucose tolerance 09/21/2010   Overactive bladder 10/20/2015   Pneumonia    as a child   Pulmonary HTN ([place]) 06/19/2015   Shortness of breath dyspnea    Sleep apnea    MILD NO CPAP ORDERED   Snoring 06/19/2015    Tobacco History: Social History   Tobacco Use  Smoking Status Former   Packs/day: 1.50   Years: 30.00   Pack years: 45.00   Types: Cigarettes   Quit date: 04/21/2003   Years since quitting: 18.0  Smokeless Tobacco Never   Counseling given: Not Answered   Outpatient Medications Prior to Visit  Medication Sig Dispense Refill   acetaminophen (TYLENOL) 500 MG tablet Take 500 mg by mouth every 6 (six) hours as needed for mild pain or moderate pain.     albuterol (PROVENTIL) (2.5 MG/3ML) 0.083% nebulizer solution USE EVERY 4 HOURS WITH IPRATROPIUM 300 mL 12   albuterol (VENTOLIN HFA) 108 (90 Base) MCG/ACT inhaler Inhale 2 puffs by mouth every 6 hours as needed for wheezing or shortness of breath. 18 g 5   apixaban (ELIQUIS) 5 MG TABS tablet Take 1 tablet by mouth twice daily 60 tablet 5   Blood Glucose Monitoring Suppl (FREESTYLE LITE) DEVI Use as directed daily E11.9 1 each 0   Budeson-Glycopyrrol-Formoterol (BREZTRI AEROSPHERE) 160-9-4.8 MCG/ACT AERO Inhale 2 puffs into the lungs 2 (two) times daily. 32.1 g 3   Budeson-Glycopyrrol-Formoterol (BREZTRI AEROSPHERE) 160-9-4.8 MCG/ACT AERO Inhale 2 puffs into the lungs in the morning and at bedtime. 5.9 g 0   cetirizine (ZYRTEC) 10 MG tablet Take 10 mg by mouth daily.      Cholecalciferol 50 MCG (2000 UT) TABS 1 tab by mouth once daily 30 tablet 99   diphenoxylate-atropine (LOMOTIL) 2.5-0.025 MG tablet Take 1 tablet by mouth 4 (four) times daily as needed for diarrhea or loose stools. 40 tablet 0   escitalopram (LEXAPRO) 20 MG tablet Take 1 tablet by mouth once daily 90 tablet 1   estradiol (ESTRACE) 1 MG tablet Take 1 mg by mouth daily.     EUTHYROX 25 MCG tablet TAKE 1 TABLET BY MOUTH ONCE DAILY BEFORE BREAKFAST . APPOINTMENT REQUIRED FOR FUTURE REFILLS 90 tablet 1   furosemide (LASIX) 40 MG tablet Take 1 tablet by mouth once daily 90 tablet 3   glucose blood (FREESTYLE LITE) test strip Use as instructed once daily E11.9 100 each 12   guaiFENesin (MUCINEX) 600 MG 12 hr tablet Take 600 mg by mouth 2 (two) times daily as needed for cough.  ipratropium (ATROVENT) 0.02 % nebulizer solution INHALE 1 VIAL IN NEBULIZER 4 TIMES DAILY WITH  ALBUTEROL 300 mL 0   irbesartan (AVAPRO) 150 MG tablet TAKE ONE TABLET BY MOUTH ONCE DAILY 90 tablet 0   Lancets MISC Use as directed once daily E11.9 100 each 11   LORazepam (ATIVAN) 1 MG tablet Take 1 tablet by mouth twice daily as needed for anxiety 60 tablet 2   Magnesium Oxide 500 MG CAPS 1 tab by mouth twice per day 60 capsule 5   metFORMIN (GLUCOPHAGE-XR) 500 MG 24 hr tablet TAKE 2 TABLETS BY MOUTH ONCE DAILY WITH BREAKFAST 180 tablet 0   metoprolol tartrate (LOPRESSOR) 100 MG tablet Take 1 tablet by mouth twice daily 180 tablet 0   pantoprazole (PROTONIX) 40 MG tablet Take 1 tablet by mouth once daily 90 tablet 3   potassium chloride SA (KLOR-CON) 20 MEQ tablet Take 1 tablet (20 mEq total) by mouth daily. 90 tablet 3   rosuvastatin (CRESTOR) 40 MG tablet Take 1 tablet (40 mg total) by mouth daily. 90 tablet 3   tiZANidine (ZANAFLEX) 2 MG tablet Take 1 tablet (2 mg total) by mouth every 6 (six) hours as needed for muscle spasms. 60 tablet 2   calcium-vitamin D (OSCAL 500/200 D-3) 500-200 MG-UNIT tablet Take 1 tablet by mouth 2  (two) times daily. 60 tablet 2   No facility-administered medications prior to visit.      Review of Systems  Review of Systems  Constitutional: Negative.   HENT: Negative.    Respiratory:  Positive for shortness of breath. Negative for cough, chest tightness and wheezing.   Cardiovascular:  Positive for leg swelling.    Physical Exam  BP 126/82 (BP Location: Right Arm, Patient Position: Sitting, Cuff Size: Large)    Pulse 85    Temp 98 F (36.7 C) (Oral)    Ht 5\' 2"  (1.575 m)    Wt 274 lb (124.3 kg)    SpO2 96%    BMI 50.12 kg/m  Physical Exam Constitutional:      General: She is not in acute distress.    Appearance: Normal appearance. She is obese. She is not ill-appearing.  HENT:     Head: Normocephalic and atraumatic.  Cardiovascular:     Rate and Rhythm: Normal rate and regular rhythm.     Comments: + 2 pedal  Pulmonary:     Effort: Pulmonary effort is normal.     Breath sounds: Normal breath sounds.  Musculoskeletal:        General: Normal range of motion.  Skin:    General: Skin is warm and dry.  Neurological:     General: No focal deficit present.     Mental Status: She is alert and oriented to person, place, and time. Mental status is at baseline.  Psychiatric:        Mood and Affect: Mood normal.        Behavior: Behavior normal.        Thought Content: Thought content normal.        Judgment: Judgment normal.     Lab Results:  CBC    Component Value Date/Time   WBC 9.2 07/09/2020 1441   RBC 4.15 07/09/2020 1441   HGB 13.1 07/09/2020 1441   HCT 39.0 07/09/2020 1441   PLT 238.0 07/09/2020 1441   MCV 94.0 07/09/2020 1441   MCH 31.1 06/14/2020 0248   MCHC 33.5 07/09/2020 1441   RDW 15.7 (H) 07/09/2020 1441  LYMPHSABS 2.7 07/09/2020 1441   MONOABS 0.8 07/09/2020 1441   EOSABS 0.2 07/09/2020 1441   BASOSABS 0.1 07/09/2020 1441    BMET    Component Value Date/Time   NA 141 01/09/2021 1257   NA 136 06/26/2020 1247   K 4.1 01/09/2021 1257    CL 100 01/09/2021 1257   CO2 31 01/09/2021 1257   GLUCOSE 118 (H) 01/09/2021 1257   BUN 18 01/09/2021 1257   BUN 24 06/26/2020 1247   CREATININE 0.96 01/09/2021 1257   CALCIUM 9.7 01/09/2021 1257   GFRNONAA 44 (L) 06/14/2020 0248   GFRAA >60 03/28/2019 0912    BNP    Component Value Date/Time   BNP 199.9 (H) 06/13/2020 1806    ProBNP    Component Value Date/Time   PROBNP 244.0 (H) 07/09/2020 1441    Imaging: No results found.   Assessment & Plan:   COPD (chronic obstructive pulmonary disease) ([place]) COPD seems to be well controlled with Breztri, no evidence of acute exacerbation on exam today. Lungs were clear without wheezing. I think her intermittent dyspnea symptoms are related to her not taking diuretic today. She is receiving Breztri through patient assistance without issues. Due for follow-up with Dr. Lamonte Sakai in June 2023.   Recommendations: - Continue Breztri aerosphere two puffs morning and evening  - Use ipratropium-albuterol nebulizer every 6 hours as needed for shortness of breath/wheezing   CHF (congestive heart failure) (HCC) - Mild intermittent dyspnea symptoms with + 2 pedal edema. Advised patient to take lasix 40mg  as scheduled, if no improvement notify our office or cardiology/PCP      Martyn Ehrich, NP 05/05/2021

## 2021-05-12 ENCOUNTER — Other Ambulatory Visit: Payer: Self-pay | Admitting: Internal Medicine

## 2021-05-22 NOTE — Progress Notes (Signed)
CARDIOLOGY OFFICE NOTE  Date:  05/27/2021    [place] Date of Birth: 12/18/1950 Medical Record #[id number]  PCP:  Biagio Borg, MD  Cardiologist:  Gillian Shields  No chief complaint on file.   History of Present Illness: Andrea Perry is a 71 y.o. female with a  history of diastolic dysfunction, DM, HTN, HLD and long standing symptomatic PAF - on anticoagulation  No history of CAD   - last Myoview from 2016 was normal.  - Echo 05/22/20 EF 50-55% LA 37 mm done during COVID Infection    Has failed [place] x 2 as well as flecainide She did not want to pursue hospitalization for Tikosyn or ablation and has been rate controlled on eliquis  No issues with afib No palpitations, syncope dyspnea or chest pain. Compliant with eliquis and no bleeding issues Had more edema with norvasc and it was d/c   She sees [facility] for COPD/Asthma with some peripheral eosinophilia Rx with [name] and DuoNeb Zyrtec for allergic rhinitis Uses protonix for GERD Sleep study negative   Rushed to get here this am and did not take lopressor HR up In general doing well with no palpitations, syncope or chest pain Chronic dyspnea from obesity and lung dx    Past Medical History:  Diagnosis Date   ALLERGIC RHINITIS 08/16/2008   ANXIETY 08/16/2008   Arthritis    hands, knees, lower back   ASTHMA 08/16/2008   Asthma    BRONCHITIS     DR. Lamonte Sakai     Bladder leak    Cancer ([place])    MELANOMA       Chronic lower back pain    problems with disc L2-5   COPD 08/16/2008   DEPRESSION 08/16/2008   Diabetes mellitus without complication ([place])    Dysrhythmia    hx AF   Excessive daytime sleepiness 06/19/2015   GENITAL HERPES 12/03/2006   GERD (gastroesophageal reflux disease)    H/O hiatal hernia    HEMATOCHEZIA 06/20/2009   Hemorrhoids    History of cardioversion 10/30/14   History of kidney stones    HYPERLIPIDEMIA 08/16/2008   HYPERTENSION 12/03/2006   Hypertension    Impaired glucose tolerance 09/21/2010    Overactive bladder 10/20/2015   Pneumonia    as a child   Pulmonary HTN ([place]) 06/19/2015   Shortness of breath dyspnea    Sleep apnea    MILD NO CPAP ORDERED   Snoring 06/19/2015    Past Surgical History:  Procedure Laterality Date   ABDOMINAL HYSTERECTOMY     APPENDECTOMY     CARDIOVERSION N/A 03/20/2014   Procedure: CARDIOVERSION;  Surgeon: Josue Hector, MD;  Location: [facility] ENDOSCOPY;  Service: Cardiovascular;  Laterality: N/A;   CARDIOVERSION N/A 10/30/2014   Procedure: CARDIOVERSION;  Surgeon: Josue Hector, MD;  Location: [facility] ENDOSCOPY;  Service: Cardiovascular;  Laterality: N/A;   CARDIOVERSION N/A 04/03/2019   Procedure: CARDIOVERSION;  Surgeon: Dorothy Spark, MD;  Location: [place];  Service: Cardiovascular;  Laterality: N/A;   CARPAL TUNNEL RELEASE     Right + LEFT   CESAREAN SECTION     x 1    COLONOSCOPY  2004   CYST EXCISION     RT ARM    CYSTOCELE REPAIR N/A 10/25/2012   Procedure: ANTERIOR REPAIR (CYSTOCELE);  Surgeon: Gus Height, MD;  Location: [facility];  Service: Gynecology;  Laterality: N/A;   HEEL SPUR SURGERY Bilateral    KNEE SURGERY  Right + LEFT   LUMBAR LAMINECTOMY/DECOMPRESSION MICRODISCECTOMY N/A 03/19/2016   Procedure: Laminectomy and Foraminotomy - Lumbar three-four - Lumbar four-five;  Surgeon: Eustace Moore, MD;  Location: [place];  Service: Neurosurgery;  Laterality: N/A;   RADIOLOGY WITH ANESTHESIA Right 11/01/2014   Procedure: MRI RIGHT FOREARM;  Surgeon: Medication Radiologist, MD;  Location: [place];  Service: Radiology;  Laterality: Right;   RADIOLOGY WITH ANESTHESIA Right 08/29/2015   Procedure: MRI - RIGHT FOREARM;  Surgeon: Medication Radiologist, MD;  Location: [place];  Service: Radiology;  Laterality: Right;   RADIOLOGY WITH ANESTHESIA N/A 12/05/2015   Procedure: MRI SPINE WITHOUT;  Surgeon: Medication Radiologist, MD;  Location: [place];  Service: Radiology;  Laterality: N/A;   SKIN CANCER EXCISION     BILAT SHOULDERS   SPLIT NIGHT STUDY   09/04/2015   TONSILLECTOMY AND ADENOIDECTOMY       Medications: Current Meds  Medication Sig   acetaminophen (TYLENOL) 500 MG tablet Take 500 mg by mouth every 6 (six) hours as needed for mild pain or moderate pain.   albuterol (PROVENTIL) (2.5 MG/3ML) 0.083% nebulizer solution USE EVERY 4 HOURS WITH IPRATROPIUM   albuterol (VENTOLIN HFA) 108 (90 Base) MCG/ACT inhaler Inhale 2 puffs by mouth every 6 hours as needed for wheezing or shortness of breath.   apixaban (ELIQUIS) 5 MG TABS tablet Take 1 tablet by mouth twice daily   Blood Glucose Monitoring Suppl (FREESTYLE LITE) DEVI Use as directed daily E11.9   Budeson-Glycopyrrol-Formoterol (BREZTRI AEROSPHERE) 160-9-4.8 MCG/ACT AERO Inhale 2 puffs into the lungs 2 (two) times daily.   cetirizine (ZYRTEC) 10 MG tablet Take 10 mg by mouth daily.   Cholecalciferol 50 MCG (2000 UT) TABS 1 tab by mouth once daily   diphenoxylate-atropine (LOMOTIL) 2.5-0.025 MG tablet Take 1 tablet by mouth 4 (four) times daily as needed for diarrhea or loose stools.   escitalopram (LEXAPRO) 20 MG tablet Take 1 tablet by mouth once daily   estradiol (ESTRACE) 1 MG tablet Take 1 mg by mouth daily.   EUTHYROX 25 MCG tablet TAKE 1 TABLET BY MOUTH ONCE DAILY BEFORE BREAKFAST . APPOINTMENT REQUIRED FOR FUTURE REFILLS   furosemide (LASIX) 40 MG tablet Take 1 tablet by mouth once daily   glucose blood (FREESTYLE LITE) test strip Use as instructed once daily E11.9   guaiFENesin (MUCINEX) 600 MG 12 hr tablet Take 600 mg by mouth 2 (two) times daily as needed for cough.   ipratropium (ATROVENT) 0.02 % nebulizer solution INHALE 1 VIAL IN NEBULIZER 4 TIMES DAILY WITH  ALBUTEROL   irbesartan (AVAPRO) 150 MG tablet TAKE ONE TABLET BY MOUTH ONCE DAILY   Lancets MISC Use as directed once daily E11.9   LORazepam (ATIVAN) 1 MG tablet Take 1 tablet by mouth twice daily as needed for anxiety   Magnesium Oxide 500 MG CAPS 1 tab by mouth twice per day   metFORMIN (GLUCOPHAGE-XR) 500 MG  24 hr tablet TAKE 2 TABLETS BY MOUTH ONCE DAILY WITH BREAKFAST   metoprolol tartrate (LOPRESSOR) 100 MG tablet Take 1 tablet by mouth twice daily   pantoprazole (PROTONIX) 40 MG tablet Take 1 tablet by mouth once daily   potassium chloride SA (KLOR-CON) 20 MEQ tablet Take 1 tablet (20 mEq total) by mouth daily.   rosuvastatin (CRESTOR) 40 MG tablet Take 1 tablet (40 mg total) by mouth daily.   tiZANidine (ZANAFLEX) 2 MG tablet Take 1 tablet (2 mg total) by mouth every 6 (six) hours as needed for muscle spasms.   [  DISCONTINUED] Budeson-Glycopyrrol-Formoterol (BREZTRI AEROSPHERE) 160-9-4.8 MCG/ACT AERO Inhale 2 puffs into the lungs in the morning and at bedtime.    Allergies: Allergies  Allergen Reactions   Tramadol Hcl Nausea And Vomiting and Other (See Comments)     mouth dryness, headache   Codeine Nausea And Vomiting and Nausea Only   Tape Rash    Plastic tape, bandaids and ekg leads, causes redness and rash   Vicodin [Hydrocodone-Acetaminophen] Nausea And Vomiting    Social History: The patient  reports that she quit smoking about 18 years ago. Her smoking use included cigarettes. She has a 45.00 pack-year smoking history. She has never used smokeless tobacco. She reports current alcohol use of about 2.0 standard drinks per week. She reports that she does not use drugs.   Family History: The patient's family history includes Colon polyps in her mother; Diabetes in her mother; Hyperlipidemia in her mother; Hypertension in her mother; Stroke in her father.   Review of Systems: Please see the history of present illness.   All other systems are reviewed and negative.   Physical Exam: VS:  BP 132/72    Pulse (!) 124    Ht 5\' 2"  (1.575 m)    Wt 276 lb (125.2 kg)    SpO2 98%    BMI 50.48 kg/m  .  BMI Body mass index is 50.48 kg/m.  Wt Readings from Last 3 Encounters:  05/27/21 276 lb (125.2 kg)  05/05/21 274 lb (124.3 kg)  04/01/21 275 lb 6.4 oz (124.9 kg)   Affect  appropriate Overweight white female  HEENT: normal Neck supple with no adenopathy JVP normal no bruits no thyromegaly Lungs clear with no wheezing and good diaphragmatic motion Heart:  S1/S2 no murmur, no rub, gallop or click PMI normal Abdomen: benighn, BS positve, no tenderness, no AAA no bruit.  No HSM or HJR Distal pulses intact with no bruits No edema Neuro non-focal Skin warm and dry No muscular weakness   LABORATORY DATA:  EKG:   06/06/19 afib rate 99 nonspecific ST changes  05/27/2021 afib rate 124 PVC;s nonspecific ST changes   Lab Results  Component Value Date   WBC 9.2 07/09/2020   HGB 13.1 07/09/2020   HCT 39.0 07/09/2020   PLT 238.0 07/09/2020   GLUCOSE 118 (H) 01/09/2021   CHOL 166 01/09/2021   TRIG 195.0 (H) 01/09/2021   HDL 47.00 01/09/2021   LDLDIRECT 111.0 07/09/2020   LDLCALC 80 01/09/2021   ALT 12 01/09/2021   AST 16 01/09/2021   NA 141 01/09/2021   K 4.1 01/09/2021   CL 100 01/09/2021   CREATININE 0.96 01/09/2021   BUN 18 01/09/2021   CO2 31 01/09/2021   TSH 4.10 01/09/2021   INR 1.00 03/18/2016   HGBA1C 6.5 01/09/2021   MICROALBUR <0.7 07/09/2020     BNP (last 3 results) Recent Labs    06/13/20 1806  BNP 199.9*    ProBNP (last 3 results) Recent Labs    07/09/20 1441  PROBNP 244.0*     Other Studies Reviewed Today:  ECHO IMPRESSIONS 05/2019   1. Left ventricular ejection fraction, by visual estimation, is 55 to  60%. The left ventricle has normal function. There is no left ventricular  hypertrophy.   2. Left ventricular diastolic parameters are indeterminate.   3. Global right ventricle has normal systolic function.The right  ventricular size is normal.   4. Left atrial size was normal.   5. Right atrial size was normal.   6.  Mild mitral annular calcification.   7. The mitral valve is normal in structure. No evidence of mitral valve  regurgitation.   8. The tricuspid valve is normal in structure.   9. The tricuspid valve  is normal in structure. Tricuspid valve  regurgitation is not demonstrated.  10. The aortic valve was not well visualized. Aortic valve regurgitation  is not visualized. No evidence of aortic valve stenosis.  11. The pulmonic valve was not well visualized. Pulmonic valve  regurgitation is not visualized.  12. The inferior vena cava is dilated in size with <50% respiratory  variability, suggesting right atrial pressure of 15 mmHg.  13. TR signal is inadequate for assessing pulmonary artery systolic  pressure.        Procedure: DC Cardioversion 03/2019 Indications: atrial fibrillation   Procedure Details Consent: Obtained Time Out: Verified patient identification, verified procedure, site/side was marked, verified correct patient position, special equipment/implants available, Radiology Safety Procedures followed,  medications/allergies/relevent history reviewed, required imaging and test results available.  Performed   The patient has been on adequate anticoagulation.  The patient received IV propofol administered by anesthesia staff for deep sedation.  Synchronous cardioversion was performed at 120 joules.   The cardioversion was successful.     Complications: No apparent complications Patient did tolerate procedure well.     [name], MD, [facility] 04/03/2019, 10:38 AM     ASSESSMENT & PLAN:     1 Persistent AF - has opted for rate control and continued anticoagulation with no plans for further attempts at restoration back to NSR. HR is fine today. Continue eliquis and lopressor [employer] [employer] to contact regarding any patient assistance forms for eliquis   2. HTN - improved norvasc d/c due to swelling   3. Dysphagia - swallow study negative 02/02/20   4. Probable diastolic dysfunction - she has good BP control - weight trending down. On daily lasix and Kdur   5. HLD -  On pravastatin labs with primary LDL 80 01/09/21  6. COPD/Asthma:  f/u [facility] zyrtec and inhalers      Current medicines are reviewed with the patient today.  The patient does not have concerns regarding medicines other than what has been noted above.  The following changes have been made:  See above.  Labs/ tests ordered today include: None    No orders of the defined types were placed in this encounter.    Disposition:   F/U in a year  Patient is agreeable to this plan and will call if any problems develop in the interim.   Signed: Jenkins Rouge, MD  05/27/2021 9:17 AM  [facility] [address] [place], [place]  [address] Phone: [phone] Fax: [phone]

## 2021-05-27 ENCOUNTER — Ambulatory Visit (INDEPENDENT_AMBULATORY_CARE_PROVIDER_SITE_OTHER): Payer: Medicare Other | Admitting: Cardiovascular Disease

## 2021-05-27 ENCOUNTER — Encounter: Payer: Self-pay | Admitting: Cardiovascular Disease

## 2021-05-27 ENCOUNTER — Other Ambulatory Visit: Payer: Self-pay

## 2021-05-27 VITALS — BP 132/72 | HR 124 | Ht 62.0 in | Wt 276.0 lb

## 2021-05-27 DIAGNOSIS — I5032 Chronic diastolic (congestive) heart failure: Secondary | ICD-10-CM | POA: Diagnosis not present

## 2021-05-27 DIAGNOSIS — Z7901 Long term (current) use of anticoagulants: Secondary | ICD-10-CM | POA: Diagnosis not present

## 2021-05-27 DIAGNOSIS — I480 Paroxysmal atrial fibrillation: Secondary | ICD-10-CM | POA: Diagnosis not present

## 2021-05-27 NOTE — Patient Instructions (Signed)
Medication Instructions:  °Your physician recommends that you continue on your current medications as directed. Please refer to the Current Medication list given to you today. ° °*If you need a refill on your cardiac medications before your next appointment, please call your pharmacy* ° °Lab Work: °If you have labs (blood work) drawn today and your tests are completely normal, you will receive your results only by: °MyChart Message (if you have MyChart) OR °A paper copy in the mail °If you have any lab test that is abnormal or we need to change your treatment, we will call you to review the results. ° °Testing/Procedures: °None ordered today. ° °Follow-Up: °At [facility], you and your health needs are our priority.  As part of our continuing mission to provide you with exceptional heart care, we have created designated Provider Care Teams.  These Care Teams include your primary Cardiologist (physician) and Advanced Practice Providers (APPs -  Physician Assistants and Nurse Practitioners) who all work together to provide you with the care you need, when you need it. ° °We recommend signing up for the patient portal called "MyChart".  Sign up information is provided on this After Visit Summary.  MyChart is used to connect with patients for Virtual Visits (Telemedicine).  Patients are able to view lab/test results, encounter notes, upcoming appointments, etc.  Non-urgent messages can be sent to your provider as well.   °To learn more about what you can do with MyChart, go to [email or web address].   ° °Your next appointment:   °12 month(s) ° °The format for your next appointment:   °In Person ° °Provider:   °Peter Nishan, MD { ° °

## 2021-06-06 ENCOUNTER — Other Ambulatory Visit: Payer: Self-pay | Admitting: Emergency Medicine

## 2021-07-06 ENCOUNTER — Other Ambulatory Visit: Payer: Self-pay | Admitting: Cardiovascular Disease

## 2021-07-06 ENCOUNTER — Other Ambulatory Visit: Payer: Self-pay | Admitting: Internal Medicine

## 2021-07-07 NOTE — Telephone Encounter (Signed)
Please refill as per office routine med refill policy (all routine meds to be refilled for 3 mo or monthly (per pt preference) up to one year from last visit, then month to month grace period for 3 mo, then further med refills will have to be denied) ? ?

## 2021-07-10 ENCOUNTER — Ambulatory Visit: Payer: Medicare Other | Admitting: Internal Medicine

## 2021-07-18 ENCOUNTER — Encounter: Payer: Self-pay | Admitting: Internal Medicine

## 2021-07-18 ENCOUNTER — Ambulatory Visit (INDEPENDENT_AMBULATORY_CARE_PROVIDER_SITE_OTHER): Payer: Medicare Other | Admitting: Internal Medicine

## 2021-07-18 VITALS — BP 122/80 | HR 108 | Temp 98.3°F | Ht 62.0 in | Wt 275.0 lb

## 2021-07-18 DIAGNOSIS — I4811 Longstanding persistent atrial fibrillation: Secondary | ICD-10-CM | POA: Diagnosis not present

## 2021-07-18 DIAGNOSIS — Z1211 Encounter for screening for malignant neoplasm of colon: Secondary | ICD-10-CM

## 2021-07-18 DIAGNOSIS — E538 Deficiency of other specified B group vitamins: Secondary | ICD-10-CM | POA: Diagnosis not present

## 2021-07-18 DIAGNOSIS — E039 Hypothyroidism, unspecified: Secondary | ICD-10-CM

## 2021-07-18 DIAGNOSIS — E559 Vitamin D deficiency, unspecified: Secondary | ICD-10-CM

## 2021-07-18 DIAGNOSIS — E1165 Type 2 diabetes mellitus with hyperglycemia: Secondary | ICD-10-CM

## 2021-07-18 DIAGNOSIS — Z0001 Encounter for general adult medical examination with abnormal findings: Secondary | ICD-10-CM | POA: Diagnosis not present

## 2021-07-18 DIAGNOSIS — R300 Dysuria: Secondary | ICD-10-CM

## 2021-07-18 LAB — CBC WITH DIFFERENTIAL/PLATELET
Basophils Absolute: 0 10*3/uL (ref 0.0–0.1)
Basophils Relative: 0.6 % (ref 0.0–3.0)
Eosinophils Absolute: 0.2 10*3/uL (ref 0.0–0.7)
Eosinophils Relative: 2.9 % (ref 0.0–5.0)
HCT: 35.6 % — ABNORMAL LOW (ref 36.0–46.0)
Hemoglobin: 11.6 g/dL — ABNORMAL LOW (ref 12.0–15.0)
Lymphocytes Relative: 18.5 % (ref 12.0–46.0)
Lymphs Abs: 1.4 10*3/uL (ref 0.7–4.0)
MCHC: 32.5 g/dL (ref 30.0–36.0)
MCV: 86.1 fl (ref 78.0–100.0)
Monocytes Absolute: 0.4 10*3/uL (ref 0.1–1.0)
Monocytes Relative: 4.7 % (ref 3.0–12.0)
Neutro Abs: 5.7 10*3/uL (ref 1.4–7.7)
Neutrophils Relative %: 73.3 % (ref 43.0–77.0)
Platelets: 191 10*3/uL (ref 150.0–400.0)
RBC: 4.13 Mil/uL (ref 3.87–5.11)
RDW: 14.7 % (ref 11.5–15.5)
WBC: 7.8 10*3/uL (ref 4.0–10.5)

## 2021-07-18 LAB — MICROALBUMIN / CREATININE URINE RATIO
Creatinine,U: 121.4 mg/dL
Microalb Creat Ratio: 16.4 mg/g (ref 0.0–30.0)
Microalb, Ur: 20 mg/dL — ABNORMAL HIGH (ref 0.0–1.9)

## 2021-07-18 LAB — URINALYSIS, ROUTINE W REFLEX MICROSCOPIC
Bilirubin Urine: NEGATIVE
Hgb urine dipstick: NEGATIVE
Ketones, ur: NEGATIVE
Nitrite: POSITIVE — AB
Specific Gravity, Urine: 1.015 (ref 1.000–1.030)
Total Protein, Urine: 30 — AB
Urine Glucose: NEGATIVE
Urobilinogen, UA: 0.2 (ref 0.0–1.0)
pH: 5.5 (ref 5.0–8.0)

## 2021-07-18 LAB — VITAMIN B12: Vitamin B-12: 672 pg/mL (ref 211–911)

## 2021-07-18 LAB — BASIC METABOLIC PANEL
BUN: 14 mg/dL (ref 6–23)
CO2: 30 mEq/L (ref 19–32)
Calcium: 9 mg/dL (ref 8.4–10.5)
Chloride: 105 mEq/L (ref 96–112)
Creatinine, Ser: 0.99 mg/dL (ref 0.40–1.20)
GFR: 58.58 mL/min — ABNORMAL LOW (ref >60.00)
Glucose, Bld: 137 mg/dL — ABNORMAL HIGH (ref 70–99)
Potassium: 4.7 mEq/L (ref 3.5–5.1)
Sodium: 141 mEq/L (ref 135–145)

## 2021-07-18 LAB — HEPATIC FUNCTION PANEL
ALT: 7 U/L (ref 0–35)
AST: 12 U/L (ref 0–37)
Albumin: 3.4 g/dL — ABNORMAL LOW (ref 3.5–5.2)
Alkaline Phosphatase: 66 U/L (ref 39–117)
Bilirubin, Direct: 0.1 mg/dL (ref 0.0–0.3)
Total Bilirubin: 0.7 mg/dL (ref 0.2–1.2)
Total Protein: 6.2 g/dL (ref 6.0–8.3)

## 2021-07-18 LAB — LIPID PANEL
Cholesterol: 103 mg/dL (ref 0–200)
HDL: 35.2 mg/dL — ABNORMAL LOW (ref >39.00)
LDL Cholesterol: 39 mg/dL (ref 0–99)
NonHDL: 67.94
Total CHOL/HDL Ratio: 3
Triglycerides: 146 mg/dL (ref 0.0–149.0)
VLDL: 29.2 mg/dL (ref 0.0–40.0)

## 2021-07-18 LAB — HEMOGLOBIN A1C: Hgb A1c MFr Bld: 7.1 % — ABNORMAL HIGH (ref 4.6–6.5)

## 2021-07-18 LAB — MAGNESIUM: Magnesium: 1.3 mg/dL — ABNORMAL LOW (ref 1.5–2.5)

## 2021-07-18 LAB — TSH: TSH: 4.8 u[IU]/mL (ref 0.35–5.50)

## 2021-07-18 LAB — VITAMIN D 25 HYDROXY (VIT D DEFICIENCY, FRACTURES): VITD: 24.06 ng/mL — ABNORMAL LOW (ref 30.00–100.00)

## 2021-07-18 MED ORDER — LEVOTHYROXINE SODIUM 25 MCG PO TABS: ORAL_TABLET | ORAL | 3 refills | Status: DC

## 2021-07-18 MED ORDER — TIZANIDINE HCL 4 MG PO TABS: 4.0000 mg | ORAL_TABLET | Freq: Four times a day (QID) | ORAL | 2 refills | Status: DC | PRN

## 2021-07-18 NOTE — Assessment & Plan Note (Signed)
Last vitamin D ?Lab Results  ?Component Value Date  ? VD25OH 24.06 (L) 07/18/2021  ? ?Low, to start oral replacement ? ?

## 2021-07-18 NOTE — Patient Instructions (Signed)
Ok to increase the tizanidine as we discussed ? ?Please continue all other medications as before, and refills have been done if requested. ? ?Please have the pharmacy call with any other refills you may need. ? ?Please continue your efforts at being more active, low cholesterol diet, and weight control. ? ?You are otherwise up to date with prevention measures today. ? ?Please keep your appointments with your specialists as you may have planned ? ?Please go to the LAB at the blood drawing area for the tests to be done - including the urine culture and magnesium ? ?You will be contacted by phone if any changes need to be made immediately.  Otherwise, you will receive a letter about your results with an explanation, but please check with MyChart first. ? ?Please remember to sign up for MyChart if you have not done so, as this will be important to you in the future with finding out test results, communicating by private email, and scheduling acute appointments online when needed. ? ?Please make an Appointment to return in 6 months, or sooner if needed ? ? ?

## 2021-07-18 NOTE — Progress Notes (Addendum)
Patient ID: Andrea Perry, female   DOB: 18-Jul-1950, 71 y.o.   MRN: [id number] ? ? ? ?     Chief Complaint:: wellness exam and Follow-up (6 month f/u) ? Low thyroid, low magnesium, dysuria, hld, afib ? ?     HPI:  Andrea Perry is a 71 y.o. female here for wellness exam; due for colonoscopy but declines mammogram for now, plans to call for own eye exam, declines shingrix, tdap o/w up to date ?         ?              Also out of thyroid med for 1 wk.  Denies hyper or hypo thyroid symptoms such as voice, skin or hair change.  Pt denies chest pain, increased sob or doe, wheezing, orthopnea, PND, increased LE swelling, palpitations, dizziness or syncope.   Pt denies polydipsia, polyuria, or new focal neuro s/s.    Pt denies fever, wt loss, night sweats, loss of appetite, or other constitutional symptoms  Asks for f/u low thyroid, taking some otc magnesium supplement, wondering if enough.  Also has mild dysuria for 2 days but Denies urinary symptoms such as frequency, urgency, flank pain, hematuria or n/v, fever, chills. Wondering if she has uti.  HR taken today with walking in the door, has been < 100 at home more at rest.  Trying to follow lower chol diet.  Has had increased muscle cramps lately, asks for increased tizanidine ?  ?Wt Readings from Last 3 Encounters:  ?07/18/21 275 lb (124.7 kg)  ?05/27/21 276 lb (125.2 kg)  ?05/05/21 274 lb (124.3 kg)  ? ?BP Readings from Last 3 Encounters:  ?07/18/21 122/80  ?05/27/21 132/72  ?05/05/21 126/82  ? ?Immunization History  ?Administered Date(s) Administered  ? Fluad Quad(high Dose 65+) 02/09/2019, 06/11/2020, 01/09/2021  ? Influenza, High Dose Seasonal PF 03/23/2018  ? Influenza,inj,Quad PF,6+ Mos 03/01/2013, 02/14/2014, 03/08/2015, 04/16/2016, 01/12/2017  ? Influenza-Unspecified 01/18/2017  ? Pneumococcal Conjugate-13 03/30/2013  ? Pneumococcal Polysaccharide-23 04/04/2014, 07/09/2020  ? Tdap 09/24/2010  ? ?Health Maintenance Due  ?Topic Date Due  ? COLONOSCOPY (Pts 45-33yr  Insurance coverage will need to be confirmed)  11/16/2020  ? ?  ? ?Past Medical History:  ?Diagnosis Date  ? ALLERGIC RHINITIS 08/16/2008  ? ANXIETY 08/16/2008  ? Arthritis   ? hands, knees, lower back  ? ASTHMA 08/16/2008  ? Asthma   ? BRONCHITIS     DR. BLamonte Sakai   ? Bladder leak   ? [facility]   ? MELANOMA      ? Chronic lower back pain   ? problems with disc L2-5  ? COPD 08/16/2008  ? DEPRESSION 08/16/2008  ? Diabetes mellitus without complication ([facility]   ? Dysrhythmia   ? hx AF  ? Excessive daytime sleepiness 06/19/2015  ? GENITAL HERPES 12/03/2006  ? GERD (gastroesophageal reflux disease)   ? H/O hiatal hernia   ? HEMATOCHEZIA 06/20/2009  ? Hemorrhoids   ? History of cardioversion 10/30/14  ? History of kidney stones   ? HYPERLIPIDEMIA 08/16/2008  ? HYPERTENSION 12/03/2006  ? Hypertension   ? Impaired glucose tolerance 09/21/2010  ? Overactive bladder 10/20/2015  ? Pneumonia   ? as a child  ? Pulmonary HTN ([facility] 06/19/2015  ? Shortness of breath dyspnea   ? Sleep apnea   ? MILD NO CPAP ORDERED  ? Snoring 06/19/2015  ? ?Past Surgical History:  ?Procedure Laterality Date  ? ABDOMINAL HYSTERECTOMY    ? APPENDECTOMY    ?  CARDIOVERSION N/A 03/20/2014  ? Procedure: CARDIOVERSION;  Surgeon: Josue Hector, MD;  Location: [place];  Service: Cardiovascular;  Laterality: N/A;  ? CARDIOVERSION N/A 10/30/2014  ? Procedure: CARDIOVERSION;  Surgeon: Josue Hector, MD;  Location: [place];  Service: Cardiovascular;  Laterality: N/A;  ? CARDIOVERSION N/A 04/03/2019  ? Procedure: CARDIOVERSION;  Surgeon: Dorothy Spark, MD;  Location[facility] ENDOSCOPY;  Service: Cardiovascular;  Laterality: N/A;  ? CARPAL TUNNEL RELEASE    ? Right + LEFT  ? CESAREAN SECTION    ? x 1   ? COLONOSCOPY  2004  ? CYST EXCISION    ? RT ARM   ? CYSTOCELE REPAIR N/A 10/25/2012  ? Procedure: ANTERIOR REPAIR (CYSTOCELE);  Surgeon: Gus Height, MD;  Location: [facility];  Service: Gynecology;  Laterality: N/A;  ? HEEL SPUR SURGERY Bilateral   ? KNEE SURGERY    ? Right + LEFT   ? LUMBAR LAMINECTOMY/DECOMPRESSION MICRODISCECTOMY N/A 03/19/2016  ? Procedure: Laminectomy and Foraminotomy - Lumbar three-four - Lumbar four-five;  Surgeon: Eustace Moore, MD;  Location: [place];  Service: Neurosurgery;  Laterality: N/A;  ? RADIOLOGY WITH ANESTHESIA Right 11/01/2014  ? Procedure: MRI RIGHT FOREARM;  Surgeon: Medication Radiologist, MD;  Location: [place];  Service: Radiology;  Laterality: Right;  ? RADIOLOGY WITH ANESTHESIA Right 08/29/2015  ? Procedure: MRI - RIGHT FOREARM;  Surgeon: Medication Radiologist, MD;  Location: [place];  Service: Radiology;  Laterality: Right;  ? RADIOLOGY WITH ANESTHESIA N/A 12/05/2015  ? Procedure: MRI SPINE WITHOUT;  Surgeon: Medication Radiologist, MD;  Location: [place];  Service: Radiology;  Laterality: N/A;  ? SKIN CANCER EXCISION    ? BILAT SHOULDERS  ? SPLIT NIGHT STUDY  09/04/2015  ? TONSILLECTOMY AND ADENOIDECTOMY    ? ? reports that she quit smoking about 18 years ago. Her smoking use included cigarettes. She has a 45.00 pack-year smoking history. She has never used smokeless tobacco. She reports current alcohol use of about 2.0 standard drinks per week. She reports that she does not use drugs. ?family history includes Colon polyps in her mother; Diabetes in her mother; Hyperlipidemia in her mother; Hypertension in her mother; Stroke in her father. ?Allergies  ?Allergen Reactions  ? Tramadol Hcl Nausea And Vomiting and Other (See Comments)  ?   mouth dryness, headache  ? Codeine Nausea And Vomiting and Nausea Only  ? Tape Rash  ?  Plastic tape, bandaids and ekg leads, causes redness and rash  ? Vicodin [Hydrocodone-Acetaminophen] Nausea And Vomiting  ? ?Current Outpatient Medications on File Prior to Visit  ?Medication Sig Dispense Refill  ? acetaminophen (TYLENOL) 500 MG tablet Take 500 mg by mouth every 6 (six) hours as needed for mild pain or moderate pain.    ? albuterol (PROVENTIL) (2.5 MG/3ML) 0.083% nebulizer solution USE EVERY 4 HOURS WITH IPRATROPIUM 300 mL  12  ? albuterol (VENTOLIN HFA) 108 (90 Base) MCG/ACT inhaler Inhale 2 puffs by mouth every 6 hours as needed for wheezing or shortness of breath. 18 g 5  ? apixaban (ELIQUIS) 5 MG TABS tablet Take 1 tablet by mouth twice daily 60 tablet 5  ? Blood Glucose Monitoring Suppl (FREESTYLE LITE) DEVI Use as directed daily E11.9 1 each 0  ? Budeson-Glycopyrrol-Formoterol (BREZTRI AEROSPHERE) 160-9-4.8 MCG/ACT AERO Inhale 2 puffs into the lungs 2 (two) times daily. 32.1 g 3  ? calcium-vitamin D (OSCAL 500/200 D-3) 500-200 MG-UNIT tablet Take 1 tablet by mouth 2 (two) times daily. 60 tablet 2  ?  cetirizine (ZYRTEC) 10 MG tablet Take 10 mg by mouth daily.    ? Cholecalciferol 50 MCG (2000 UT) TABS 1 tab by mouth once daily 30 tablet 99  ? diphenoxylate-atropine (LOMOTIL) 2.5-0.025 MG tablet Take 1 tablet by mouth 4 (four) times daily as needed for diarrhea or loose stools. 40 tablet 0  ? escitalopram (LEXAPRO) 20 MG tablet Take 1 tablet by mouth once daily 90 tablet 1  ? estradiol (ESTRACE) 1 MG tablet Take 1 mg by mouth daily.    ? furosemide (LASIX) 40 MG tablet Take 1 tablet by mouth once daily 90 tablet 3  ? glucose blood (FREESTYLE LITE) test strip Use as instructed once daily E11.9 100 each 12  ? guaiFENesin (MUCINEX) 600 MG 12 hr tablet Take 600 mg by mouth 2 (two) times daily as needed for cough.    ? ipratropium (ATROVENT) 0.02 % nebulizer solution INHALE 1 VIAL IN NEBULIZER 4 TIMES DAILY WITH  ALBUTEROL 300 mL 0  ? irbesartan (AVAPRO) 150 MG tablet Take 1 tablet by mouth once daily 30 tablet 0  ? Lancets MISC Use as directed once daily E11.9 100 each 11  ? LORazepam (ATIVAN) 1 MG tablet Take 1 tablet by mouth twice daily as needed for anxiety 60 tablet 2  ? Magnesium Oxide 500 MG CAPS 1 tab by mouth twice per day 60 capsule 5  ? metFORMIN (GLUCOPHAGE-XR) 500 MG 24 hr tablet TAKE 2 TABLETS BY MOUTH ONCE DAILY WITH BREAKFAST 180 tablet 2  ? metoprolol tartrate (LOPRESSOR) 100 MG tablet Take 1 tablet by mouth twice  daily 180 tablet 3  ? pantoprazole (PROTONIX) 40 MG tablet Take 1 tablet by mouth once daily 30 tablet 0  ? potassium chloride SA (KLOR-CON) 20 MEQ tablet Take 1 tablet (20 mEq total) by mouth daily. 90 t

## 2021-07-19 ENCOUNTER — Encounter: Payer: Self-pay | Admitting: Internal Medicine

## 2021-07-19 NOTE — Assessment & Plan Note (Signed)
Lab Results  ?Component Value Date  ? TSH 4.80 07/18/2021  ? ?Stable, pt to continue levothyroxine ? ?

## 2021-07-19 NOTE — Assessment & Plan Note (Signed)
Mild, cant r/o uti  - also for urine cultrue ?

## 2021-07-19 NOTE — Assessment & Plan Note (Signed)
Also for f/u lab today 

## 2021-07-19 NOTE — Assessment & Plan Note (Signed)
Lab Results  ?Component Value Date  ? HGBA1C 7.1 (H) 07/18/2021  ? ?Mild uncontrolled, goal ldl < 7, , pt to continue current medical treatment metformin as declines change for now ? ?

## 2021-07-19 NOTE — Assessment & Plan Note (Signed)
With mild RVR on walking in, now < 100 and pt declines other med change for now, to f/u cardiology as planned ?

## 2021-07-19 NOTE — Assessment & Plan Note (Signed)
Age and sex appropriate education and counseling updated with regular exercise and diet ?Referrals for preventative services - for colonoscopy, declines mammogram will call for own eye exam ?Immunizations addressed - declines shiingrix, tdap ?Smoking counseling  - none needed ?Evidence for depression or other mood disorder - none significant ?Most recent labs reviewed. ?I have personally reviewed and have noted: ?1) the patient's medical and social history ?2) The patient's current medications and supplements ?3) The patient's height, weight, and BMI have been recorded in the chart ? ?

## 2021-07-20 ENCOUNTER — Encounter: Payer: Self-pay | Admitting: Internal Medicine

## 2021-07-20 ENCOUNTER — Other Ambulatory Visit: Payer: Self-pay | Admitting: Internal Medicine

## 2021-07-20 LAB — URINE CULTURE

## 2021-07-20 MED ORDER — MAGNESIUM OXIDE 400 MG PO CAPS: ORAL_CAPSULE | ORAL | 3 refills | Status: DC

## 2021-07-21 ENCOUNTER — Telehealth: Payer: Self-pay | Admitting: Internal Medicine

## 2021-07-21 ENCOUNTER — Other Ambulatory Visit: Payer: Self-pay | Admitting: Internal Medicine

## 2021-07-21 MED ORDER — CIPROFLOXACIN HCL 500 MG PO TABS: 500.0000 mg | ORAL_TABLET | Freq: Two times a day (BID) | ORAL | 0 refills | Status: AC

## 2021-07-21 NOTE — Telephone Encounter (Signed)
Ok to hold the tizanidine for now - ok to take the cipro ?

## 2021-07-21 NOTE — Telephone Encounter (Signed)
Pharmacy called to report interaction of ciprofloxacin (CIPRO) 500 MG tablet and tiZANidine (ZANAFLEX) 4 MG tablet.  ? ?Wants to know if MD advised pt stop taking tiZANidine, or continue?  ? ? ?

## 2021-07-22 NOTE — Telephone Encounter (Signed)
I called Pharmacy to advise that Dr. Jenny Reichmann states continue with the Cipro for now. ? ?FYI ? ?Pt called in and I advise the pt of the same information ?

## 2021-07-24 ENCOUNTER — Telehealth: Payer: Self-pay

## 2021-07-24 NOTE — Telephone Encounter (Signed)
These diagnoses are not a reason for not taking cipro, so unless she has another concern, she is ok for cipro ?

## 2021-07-24 NOTE — Telephone Encounter (Signed)
Left message for patient to call back  

## 2021-07-24 NOTE — Telephone Encounter (Signed)
Pt states that she can't take the current Rx Cipro due to her having COPD and Chronic Bronchitis.  ? ?Pt is request another Abx. ? ?Please advise ? ? ?

## 2021-07-31 ENCOUNTER — Telehealth: Payer: Self-pay | Admitting: Emergency Medicine

## 2021-07-31 NOTE — Telephone Encounter (Signed)
I called the pharmacy and they have a refill on file and the patient is aware that the refill on file. I called the patient and let her know that the pharmacy will fill the nebulizer solution. ?

## 2021-08-05 DIAGNOSIS — H52203 Unspecified astigmatism, bilateral: Secondary | ICD-10-CM | POA: Diagnosis not present

## 2021-08-05 NOTE — Telephone Encounter (Signed)
Pt never called back x's 2 weeks. Closing encounter.Marland KitchenJohny Perry ?

## 2021-08-07 ENCOUNTER — Telehealth: Payer: Self-pay | Admitting: Emergency Medicine

## 2021-08-07 MED ORDER — IPRATROPIUM BROMIDE 0.02 % IN SOLN: RESPIRATORY_TRACT | 11 refills | Status: DC

## 2021-08-07 NOTE — Telephone Encounter (Signed)
Called pt's pharmacy and spoke with Inez Catalina about message received. Per Inez Catalina, pt's Rx just needs to be refilled. Refill has been sent to pharmacy for pt's med. Nothing further needed. ?

## 2021-08-08 ENCOUNTER — Other Ambulatory Visit: Payer: Self-pay | Admitting: Internal Medicine

## 2021-08-13 ENCOUNTER — Telehealth: Payer: Self-pay

## 2021-08-13 ENCOUNTER — Telehealth: Payer: Self-pay | Admitting: Internal Medicine

## 2021-08-13 NOTE — Telephone Encounter (Signed)
Left message for patient to call back to schedule Medicare Annual Wellness Visit  ? ?No hx of AWV eligible as of 06/19/11 ? ?Please schedule at anytime with [facility] if patient calls the office back.   ? ?45 Minutes appointment  ? ?Any questions, please call me at [phone]  ?

## 2021-08-13 NOTE — Telephone Encounter (Signed)
Completed application for Breztri assistance was faxed to [place].  ?

## 2021-08-14 ENCOUNTER — Ambulatory Visit: Payer: Medicare Other

## 2021-08-16 ENCOUNTER — Other Ambulatory Visit: Payer: Self-pay | Admitting: Internal Medicine

## 2021-08-16 NOTE — Telephone Encounter (Signed)
Please refill as per office routine med refill policy (all routine meds to be refilled for 3 mo or monthly (per pt preference) up to one year from last visit, then month to month grace period for 3 mo, then further med refills will have to be denied) ? ?

## 2021-08-26 ENCOUNTER — Other Ambulatory Visit: Payer: Self-pay

## 2021-08-26 MED ORDER — BREZTRI AEROSPHERE 160-9-4.8 MCG/ACT IN AERO: 2.0000 | INHALATION_SPRAY | Freq: Two times a day (BID) | RESPIRATORY_TRACT | 11 refills | Status: DC

## 2021-09-24 ENCOUNTER — Other Ambulatory Visit: Payer: Self-pay | Admitting: Cardiovascular Disease

## 2021-09-24 DIAGNOSIS — I4811 Longstanding persistent atrial fibrillation: Secondary | ICD-10-CM

## 2021-09-24 DIAGNOSIS — H26491 Other secondary cataract, right eye: Secondary | ICD-10-CM | POA: Diagnosis not present

## 2021-09-24 NOTE — Telephone Encounter (Signed)
Prescription refill request for Eliquis received. Indication: Afib  Last office visit: 05/27/21 Johnsie Cancel)  Scr: 0.99 (07/18/21) Age: 71 Weight: 124.7kg  Appropriate dose and refill sent to requested pharmacy.

## 2021-10-02 ENCOUNTER — Telehealth: Payer: Self-pay | Admitting: *Deleted

## 2021-10-02 NOTE — Telephone Encounter (Signed)
Received Breztri in the mail from pt assistance ([employer])  Called pt and she is aware and will pick up  Left up front for pick up in the box that it came in

## 2021-10-10 DIAGNOSIS — H26492 Other secondary cataract, left eye: Secondary | ICD-10-CM | POA: Diagnosis not present

## 2021-10-12 ENCOUNTER — Other Ambulatory Visit: Payer: Self-pay | Admitting: Internal Medicine

## 2021-11-05 ENCOUNTER — Ambulatory Visit (INDEPENDENT_AMBULATORY_CARE_PROVIDER_SITE_OTHER): Payer: Medicare Other | Admitting: Emergency Medicine

## 2021-11-05 ENCOUNTER — Encounter: Payer: Self-pay | Admitting: Emergency Medicine

## 2021-11-05 DIAGNOSIS — J301 Allergic rhinitis due to pollen: Secondary | ICD-10-CM

## 2021-11-05 DIAGNOSIS — K219 Gastro-esophageal reflux disease without esophagitis: Secondary | ICD-10-CM | POA: Diagnosis not present

## 2021-11-05 DIAGNOSIS — J438 Other emphysema: Secondary | ICD-10-CM | POA: Diagnosis not present

## 2021-11-05 NOTE — Assessment & Plan Note (Signed)
We will plan to continue Breztri 2 puffs twice a day.  Rinse and gargle after using. Keep your DuoNeb available to use 1 nebulizer treatment up to every 6 hours if needed for shortness of breath, chest tightness, wheezing. Follow with Dr Lamonte Sakai in 6 months or sooner if you have any problems

## 2021-11-05 NOTE — Progress Notes (Signed)
Subjective:    Patient ID: Andrea Perry, female    DOB: 05/20/1950, 71 y.o.   MRN: [id number]  HPI  ROV 04/01/21 --71 year old woman with history of COPD and reversibility/asthma component.  She has mild peripheral eosinophilia with associated allergic rhinitis.  Other past medical history significant for atrial fibrillation, hypertension, diabetes, secondary pulmonary hypertension.  She has benefited from [place] but has had difficulty getting it, needed a prior authorization.  We attempted to get assistance through [employer].  Today she reports that we still don't have this confirmed - she would need to  On Zyrtec daily, Mucinex as needed.  Uses albuterol / atrovent approximately 4x a day. She believes that she benefits.  Protonix once daily She reports that she has been having more dry cough over the last week or two. No sputum.  Her exertional shortness of breath is stable, still bothers her when she walks through the house. She is able to get around the house at a slow pace, is able to do some of her house work.   ROV 11/05/2021 --follow-up visit 71 year old woman with a history of asthmatic COPD, positive bronchodilator response.  She has allergic rhinitis with mild peripheral eosinophilia.  Past medical history also significant for atrial fibrillation, hypertension, diabetes and secondary pulmonary hypertension.  We have been managing her on Breztri, had to go through financial assistance program but it seems that this is now being obtained.  She has DuoNeb available to use if needed, uses approximately.  At her last visit in December I added fluticasone nasal spray to her Zyrtec, continued her pantoprazole  Today she reports that she is having belching, some GERD in the daily pantoprazole. She has daily cough, non-productive. Sometimes associated w wheeze. Her breathing has been stable. No flares since last time. She is slow but active. Uses duoneb a few times a day.     Review of Systems   Constitutional:  Negative for fever and unexpected weight change.  HENT:  Negative for congestion, dental problem, ear pain, nosebleeds, postnasal drip, rhinorrhea, sinus pressure, sneezing, sore throat and trouble swallowing.   Eyes:  Negative for redness and itching.  Respiratory:  Positive for cough and shortness of breath. Negative for chest tightness and wheezing.   Cardiovascular:  Negative for palpitations and leg swelling.  Gastrointestinal:  Negative for nausea and vomiting.  Genitourinary:  Negative for dysuria.  Musculoskeletal:  Negative for joint swelling.  Skin:  Negative for rash.  Neurological:  Negative for headaches.  Hematological:  Does not bruise/bleed easily.  Psychiatric/Behavioral:  Negative for dysphoric mood. The patient is not nervous/anxious.        Objective:   Physical Exam Vitals:   11/05/21 1141  BP: 136/78  Pulse: 91  SpO2: 97%  Weight: 268 lb 12.8 oz (121.9 kg)  Height: '5\' 2"'$  (1.575 m)   Gen: Pleasant, overwt,  in no distress,  normal affect  ENT: No lesions,  mouth clear,  oropharynx clear, no postnasal drip  Neck: No JVD, no stridor  Lungs: No use of accessory muscles, clear, distant at both bases  Cardiovascular: RRR, heart sounds normal, no murmur or gallops, trace bilateral ankle peripheral edema  Musculoskeletal: No deformities, bilateral foot cyanosis, venous insufficiency  Neuro: alert, non focal  Skin: ankles cool, no lesions or rashes      Assessment & Plan:  COPD (chronic obstructive pulmonary disease) (HCC) We will plan to continue Breztri 2 puffs twice a day.  Rinse and gargle after using.  Keep your DuoNeb available to use 1 nebulizer treatment up to every 6 hours if needed for shortness of breath, chest tightness, wheezing. Follow with Dr Lamonte Sakai in 6 months or sooner if you have any problems  Allergic rhinitis Continue cetirizine (Zyrtec) once daily. Restart your fluticasone nasal spray, 2 sprays each nostril once  daily, during the fall and spring allergy seasons.  GERD (gastroesophageal reflux disease) Continue your Protonix once daily.  Take this medication 1 hour around food  Baltazar Apo, MD, PhD 11/05/2021, 11:57 AM  Pulmonary and Critical Care [phone] or if no answer [phone]

## 2021-11-05 NOTE — Assessment & Plan Note (Signed)
Continue cetirizine (Zyrtec) once daily. Restart your fluticasone nasal spray, 2 sprays each nostril once daily, during the fall and spring allergy seasons.

## 2021-11-05 NOTE — Assessment & Plan Note (Signed)
Continue your Protonix once daily.  Take this medication 1 hour around food

## 2021-11-05 NOTE — Patient Instructions (Addendum)
We will plan to continue Breztri 2 puffs twice a day.  Rinse and gargle after using. Keep your DuoNeb available to use 1 nebulizer treatment up to every 6 hours if needed for shortness of breath, chest tightness, wheezing. Continue your Protonix once daily.  Take this medication 1 hour around food. Continue cetirizine (Zyrtec) once daily. Restart your fluticasone nasal spray, 2 sprays each nostril once daily, during the fall and spring allergy seasons. Follow with Andrea Perry in 6 months or sooner if you have any problems

## 2021-12-02 ENCOUNTER — Telehealth: Payer: Self-pay | Admitting: *Deleted

## 2021-12-02 NOTE — Telephone Encounter (Signed)
ATC patient x1.  Left vm letting patient know that her inhalers are here at the office and she can pick them up anytime before 5 pm.

## 2021-12-09 ENCOUNTER — Ambulatory Visit: Payer: Self-pay | Admitting: Licensed Clinical Social Worker

## 2021-12-09 NOTE — Patient Outreach (Signed)
  Care Coordination   12/09/2021 Name: CORIE ALLIS MRN: [id number] DOB: 02-14-1951   Care Coordination Outreach Attempts:  An unsuccessful telephone outreach was attempted today to offer the patient information about available care coordination services as a benefit of their health plan.   Follow Up Plan:  Additional outreach attempts will be made to offer the patient care coordination information and services.   Encounter Outcome:  No Answer  Care Coordination Interventions Activated:  No   Care Coordination Interventions:  No, not indicated    Casimer Lanius, [place] [phone]

## 2021-12-11 ENCOUNTER — Ambulatory Visit: Payer: Self-pay | Admitting: Licensed Clinical Social Worker

## 2021-12-11 NOTE — Patient Outreach (Signed)
  Care Coordination   12/11/2021 Name: Andrea Perry MRN: [id number] DOB: 03-10-1951   Care Coordination Outreach Attempts:  incoming call / Contact was made with the patient today to offer care coordination services as a benefit of their health plan. The patient requested a return call on a later date.   Follow Up Plan:  Appointment scheduled Sept 7th  Additional outreach attempts will be made to offer the patient care coordination information and services.   Encounter Outcome:  Pt. Scheduled  Care Coordination Interventions Activated:  No   Care Coordination Interventions:  No, not indicated    Casimer Lanius, [place] [phone]

## 2021-12-22 ENCOUNTER — Other Ambulatory Visit: Payer: Self-pay | Admitting: Emergency Medicine

## 2021-12-22 DIAGNOSIS — R053 Chronic cough: Secondary | ICD-10-CM

## 2021-12-25 ENCOUNTER — Encounter: Payer: Self-pay | Admitting: Licensed Clinical Social Worker

## 2021-12-25 ENCOUNTER — Telehealth: Payer: Self-pay | Admitting: Licensed Clinical Social Worker

## 2021-12-25 NOTE — Patient Instructions (Signed)
     I am sorry you were unable to keep your phone appointment today.   The Care Guide will contact you to reschedule the phone appointment    Casimer Lanius, [place] [phone]

## 2021-12-25 NOTE — Patient Outreach (Signed)
  Care Coordination   12/25/2021 Name: Andrea Perry MRN: [id number] DOB: 07/26/1950   Care Coordination Outreach Attempts:  An unsuccessful telephone outreach was attempted for a scheduled appointment today.  Follow Up Plan:  Additional outreach attempts will be made to offer the patient care coordination information and services.  Will route to care guide to see if patient would like to reschedule.  Encounter Outcome:  No Answer  Care Coordination Interventions Activated:  No   Care Coordination Interventions:  No, not indicated    Casimer Lanius, [place] [phone]

## 2021-12-29 ENCOUNTER — Telehealth: Payer: Self-pay | Admitting: *Deleted

## 2021-12-29 NOTE — Chronic Care Management (AMB) (Signed)
  Care Coordination  Outreach Note  12/29/2021 Name: JASEMINE NAWAZ MRN: [id number] DOB: 1950-10-01   Care Coordination Outreach Attempts: An unsuccessful telephone outreach was attempted today to offer the patient information about available care coordination services as a benefit of their health plan.   Follow Up Plan:  Additional outreach attempts will be made to offer the patient care coordination information and services.   Encounter Outcome:  No Answer  Julian Hy, [place] Direct Dial: [phone]

## 2022-01-08 NOTE — Chronic Care Management (AMB) (Signed)
  Care Coordination  Outreach Note  01/08/2022 Name: Andrea Perry MRN: [id number] DOB: [birth year]   Care Coordination Outreach Attempts: A second unsuccessful outreach was attempted today to offer the patient with information about available care coordination services as a benefit of their health plan.     Follow Up Plan:  No further outreach attempts will be made at this time. We have been unable to contact the patient to offer or enroll patient in care coordination services  Encounter Outcome:  No Answer  Julian Hy, [place] Direct Dial: [phone]

## 2022-01-16 ENCOUNTER — Ambulatory Visit (INDEPENDENT_AMBULATORY_CARE_PROVIDER_SITE_OTHER): Payer: Medicare Other

## 2022-01-16 VITALS — Ht 62.0 in | Wt 268.0 lb

## 2022-01-16 DIAGNOSIS — Z0000 Encounter for general adult medical examination without abnormal findings: Secondary | ICD-10-CM | POA: Diagnosis not present

## 2022-01-16 NOTE — Patient Instructions (Signed)
[name] , Thank you for taking time to come for your Medicare Wellness Visit. I appreciate your ongoing commitment to your health goals. Please review the following plan we discussed and let me know if I can assist you in the future.   Screening recommendations/referrals: Colonoscopy: declined Mammogram: declined Bone Density: declined Recommended yearly ophthalmology/optometry visit for glaucoma screening and checkup Recommended yearly dental visit for hygiene and checkup  Vaccinations: Influenza vaccine: 01/09/21 Pneumococcal vaccine: 07/09/20 Tdap vaccine: 09/24/10, due if have injury Shingles vaccine: n/d   Covid-19:n/d  Advanced directives: no  Conditions/risks identified: none  Next appointment: Follow up in one year for your annual wellness visit 01/18/23 @ 1 pm by phone   Preventive Care 6 Years and Older, Female Preventive care refers to lifestyle choices and visits with your health care provider that can promote health and wellness. What does preventive care include? A yearly physical exam. This is also called an annual well check. Dental exams once or twice a year. Routine eye exams. Ask your health care provider how often you should have your eyes checked. Personal lifestyle choices, including: Daily care of your teeth and gums. Regular physical activity. Eating a healthy diet. Avoiding tobacco and drug use. Limiting alcohol use. Practicing safe sex. Taking low-dose aspirin every day. Taking vitamin and mineral supplements as recommended by your health care provider. What happens during an annual well check? The services and screenings done by your health care provider during your annual well check will depend on your age, overall health, lifestyle risk factors, and family history of disease. Counseling  Your health care provider may ask you questions about your: Alcohol use. Tobacco use. Drug use. Emotional well-being. Home and relationship well-being. Sexual  activity. Eating habits. History of falls. Memory and ability to understand (cognition). Work and work Statistician. Reproductive health. Screening  You may have the following tests or measurements: Height, weight, and BMI. Blood pressure. Lipid and cholesterol levels. These may be checked every 5 years, or more frequently if you are over 25 years old. Skin check. Lung cancer screening. You may have this screening every year starting at age 87 if you have a 30-pack-year history of smoking and currently smoke or have quit within the past 15 years. Fecal occult blood test (FOBT) of the stool. You may have this test every year starting at age 61. Flexible sigmoidoscopy or colonoscopy. You may have a sigmoidoscopy every 5 years or a colonoscopy every 10 years starting at age 69. Hepatitis C blood test. Hepatitis B blood test. Sexually transmitted disease (STD) testing. Diabetes screening. This is done by checking your blood sugar (glucose) after you have not eaten for a while (fasting). You may have this done every 1-3 years. Bone density scan. This is done to screen for osteoporosis. You may have this done starting at age 24. Mammogram. This may be done every 1-2 years. Talk to your health care provider about how often you should have regular mammograms. Talk with your health care provider about your test results, treatment options, and if necessary, the need for more tests. Vaccines  Your health care provider may recommend certain vaccines, such as: Influenza vaccine. This is recommended every year. Tetanus, diphtheria, and acellular pertussis (Tdap, Td) vaccine. You may need a Td booster every 10 years. Zoster vaccine. You may need this after age 86. Pneumococcal 13-valent conjugate (PCV13) vaccine. One dose is recommended after age 75. Pneumococcal polysaccharide (PPSV23) vaccine. One dose is recommended after age 33. Talk to your  health care provider about which screenings and vaccines  you need and how often you need them. This information is not intended to replace advice given to you by your health care provider. Make sure you discuss any questions you have with your health care provider. Document Released: 05/03/2015 Document Revised: 12/25/2015 Document Reviewed: 02/05/2015 Elsevier Interactive Patient Education  2017 [employer] Prevention in the Home Falls can cause injuries. They can happen to people of all ages. There are many things you can do to make your home safe and to help prevent falls. What can I do on the outside of my home? Regularly fix the edges of walkways and driveways and fix any cracks. Remove anything that might make you trip as you walk through a door, such as a raised step or threshold. Trim any bushes or trees on the path to your home. Use bright outdoor lighting. Clear any walking paths of anything that might make someone trip, such as rocks or tools. Regularly check to see if handrails are loose or broken. Make sure that both sides of any steps have handrails. Any raised decks and porches should have guardrails on the edges. Have any leaves, snow, or ice cleared regularly. Use sand or salt on walking paths during winter. Clean up any spills in your garage right away. This includes oil or grease spills. What can I do in the bathroom? Use night lights. Install grab bars by the toilet and in the tub and shower. Do not use towel bars as grab bars. Use non-skid mats or decals in the tub or shower. If you need to sit down in the shower, use a plastic, non-slip stool. Keep the floor dry. Clean up any water that spills on the floor as soon as it happens. Remove soap buildup in the tub or shower regularly. Attach bath mats securely with double-sided non-slip rug tape. Do not have throw rugs and other things on the floor that can make you trip. What can I do in the bedroom? Use night lights. Make sure that you have a light by your bed that  is easy to reach. Do not use any sheets or blankets that are too big for your bed. They should not hang down onto the floor. Have a firm chair that has side arms. You can use this for support while you get dressed. Do not have throw rugs and other things on the floor that can make you trip. What can I do in the kitchen? Clean up any spills right away. Avoid walking on wet floors. Keep items that you use a lot in easy-to-reach places. If you need to reach something above you, use a strong step stool that has a grab bar. Keep electrical cords out of the way. Do not use floor polish or wax that makes floors slippery. If you must use wax, use non-skid floor wax. Do not have throw rugs and other things on the floor that can make you trip. What can I do with my stairs? Do not leave any items on the stairs. Make sure that there are handrails on both sides of the stairs and use them. Fix handrails that are broken or loose. Make sure that handrails are as long as the stairways. Check any carpeting to make sure that it is firmly attached to the stairs. Fix any carpet that is loose or worn. Avoid having throw rugs at the top or bottom of the stairs. If you do have throw rugs, attach them  to the floor with carpet tape. Make sure that you have a light switch at the top of the stairs and the bottom of the stairs. If you do not have them, ask someone to add them for you. What else can I do to help prevent falls? Wear shoes that: Do not have high heels. Have rubber bottoms. Are comfortable and fit you well. Are closed at the toe. Do not wear sandals. If you use a stepladder: Make sure that it is fully opened. Do not climb a closed stepladder. Make sure that both sides of the stepladder are locked into place. Ask someone to hold it for you, if possible. Clearly mark and make sure that you can see: Any grab bars or handrails. First and last steps. Where the edge of each step is. Use tools that help you  move around (mobility aids) if they are needed. These include: Canes. Walkers. Scooters. Crutches. Turn on the lights when you go into a dark area. Replace any light bulbs as soon as they burn out. Set up your furniture so you have a clear path. Avoid moving your furniture around. If any of your floors are uneven, fix them. If there are any pets around you, be aware of where they are. Review your medicines with your doctor. Some medicines can make you feel dizzy. This can increase your chance of falling. Ask your doctor what other things that you can do to help prevent falls. This information is not intended to replace advice given to you by your health care provider. Make sure you discuss any questions you have with your health care provider. Document Released: 01/31/2009 Document Revised: 09/12/2015 Document Reviewed: 05/11/2014 Elsevier Interactive Patient Education  2017 [employer].

## 2022-01-16 NOTE — Progress Notes (Signed)
Virtual Visit via Telephone Note  I connected with  Andrea Perry on 01/16/22 at  3:15 PM EDT by telephone and verified that I am speaking with the correct person using two identifiers.  Location: Patient: home Provider: Ether Griffins Persons participating in the virtual visit: Catharine   I discussed the limitations, risks, security and privacy concerns of performing an evaluation and management service by telephone and the availability of in person appointments. The patient expressed understanding and agreed to proceed.  Interactive audio and video telecommunications were attempted between this nurse and patient, however failed, due to patient having technical difficulties OR patient did not have access to video capability.  We continued and completed visit with audio only.  Some vital signs may be absent or patient reported.   Dionisio David, LPN  Subjective:   Andrea Perry is a 71 y.o. female who presents for Medicare Annual (Subsequent) preventive examination.  Review of Systems     Cardiac Risk Factors include: advanced age (>29mn, >>62women);diabetes mellitus;hypertension;dyslipidemia     Objective:    There were no vitals filed for this visit. There is no height or weight on file to calculate BMI.     01/16/2022    3:09 PM 06/13/2020   10:36 PM 05/21/2020    4:40 PM 05/21/2020   10:40 AM 05/29/2019    8:52 PM 04/03/2019   10:09 AM 04/03/2019   10:08 AM  Advanced Directives  Does Patient Have a Medical Advance Directive? No No No No No No No  Would patient like information on creating a medical advance directive? No - Patient declined No - Patient declined No - Patient declined No - Patient declined Yes (MAU/Ambulatory/Procedural Areas - Information given) No - Patient declined No - Patient declined    Current Medications (verified) Outpatient Encounter Medications as of 01/16/2022  Medication Sig   acetaminophen (TYLENOL) 500 MG tablet Take 500  mg by mouth every 6 (six) hours as needed for mild pain or moderate pain.   albuterol (PROVENTIL) (2.5 MG/3ML) 0.083% nebulizer solution USE EVERY 4 HOURS WITH IPRATROPIUM   albuterol (VENTOLIN HFA) 108 (90 Base) MCG/ACT inhaler Inhale 2 puffs by mouth every 6 hours as needed for wheezing or shortness of breath.   amLODipine (NORVASC) 10 MG tablet Take 1 tablet by mouth daily.   apixaban (ELIQUIS) 5 MG TABS tablet Take 1 tablet by mouth twice daily   Blood Glucose Monitoring Suppl (FREESTYLE LITE) DEVI Use as directed daily E11.9   Budeson-Glycopyrrol-Formoterol (BREZTRI AEROSPHERE) 160-9-4.8 MCG/ACT AERO Inhale 2 puffs into the lungs 2 (two) times daily.   cetirizine (ZYRTEC) 10 MG tablet Take 10 mg by mouth daily.   Cholecalciferol 50 MCG (2000 UT) TABS 1 tab by mouth once daily   cyclobenzaprine (FLEXERIL) 5 MG tablet    diphenoxylate-atropine (LOMOTIL) 2.5-0.025 MG tablet Take 1 tablet by mouth 4 (four) times daily as needed for diarrhea or loose stools.   escitalopram (LEXAPRO) 10 MG tablet    escitalopram (LEXAPRO) 20 MG tablet Take 1 tablet by mouth once daily   estradiol (ESTRACE) 1 MG tablet Take 1 mg by mouth daily.   flecainide (TAMBOCOR) 50 MG tablet TAKE 1 TABLET BY MOUTH TWICE DAILY PLEASE KEEP UPCOMING APPOINTMENT FOR FURTHER REFILLS THANK YOU   fluorouracil (EFUDEX) 5 % cream    fluticasone (FLONASE) 50 MCG/ACT nasal spray    furosemide (LASIX) 40 MG tablet Take 1 tablet by mouth once daily   gabapentin (NEURONTIN)  300 MG capsule    glucose blood (FREESTYLE LITE) test strip Use as instructed once daily E11.9   ipratropium (ATROVENT) 0.02 % nebulizer solution INHALE 1 VIAL IN NEBULIZER 4 TIMES DAILY WITH  ALBUTEROL   ipratropium-albuterol (DUONEB) 0.5-2.5 (3) MG/3ML SOLN USE 1 AMPULE IN NEBULIZER 4 TIMES DAILY   irbesartan (AVAPRO) 150 MG tablet Take 1 tablet by mouth once daily   ketoconazole (NIZORAL) 2 % cream    Lancets MISC Use as directed once daily E11.9   levothyroxine  (EUTHYROX) 25 MCG tablet TAKE 1 TABLET BY MOUTH ONCE DAILY BEFORE BREAKFAST . APPOINTMENT REQUIRED FOR FUTURE REFILLS   LORazepam (ATIVAN) 1 MG tablet Take 1 tablet by mouth twice daily as needed for anxiety   metFORMIN (GLUCOPHAGE-XR) 500 MG 24 hr tablet TAKE 2 TABLETS BY MOUTH ONCE DAILY WITH BREAKFAST   metoprolol tartrate (LOPRESSOR) 100 MG tablet Take 1 tablet by mouth twice daily   metoprolol tartrate (LOPRESSOR) 50 MG tablet TAKE 1 TABLETS BY MOUTH TWICE DAILY   naproxen (NAPROSYN) 500 MG tablet    nystatin powder    pantoprazole (PROTONIX) 40 MG tablet Take 1 tablet by mouth daily.   potassium chloride SA (KLOR-CON) 20 MEQ tablet Take 1 tablet (20 mEq total) by mouth daily.   pravastatin (PRAVACHOL) 40 MG tablet Take 1 tablet by mouth daily.   rosuvastatin (CRESTOR) 40 MG tablet Take 1 tablet (40 mg total) by mouth daily.   calcium-vitamin D (OSCAL 500/200 D-3) 500-200 MG-UNIT tablet Take 1 tablet by mouth 2 (two) times daily.   doxycycline (VIBRA-TABS) 100 MG tablet Take 1 tablet by mouth 2 (two) times daily. (Patient not taking: Reported on 01/16/2022)   guaiFENesin (MUCINEX) 600 MG 12 hr tablet Take 600 mg by mouth 2 (two) times daily as needed for cough. (Patient not taking: Reported on 01/16/2022)   levofloxacin (LEVAQUIN) 250 MG tablet  (Patient not taking: Reported on 01/16/2022)   Magnesium Oxide 400 MG CAPS 1 tab by mouth once daily (Patient not taking: Reported on 01/16/2022)   Magnesium Oxide 500 MG CAPS 1 tab by mouth twice per day (Patient not taking: Reported on 01/16/2022)   pantoprazole (PROTONIX) 40 MG tablet Take 1 tablet by mouth once daily   penicillin v potassium (VEETID) 500 MG tablet  (Patient not taking: Reported on 01/16/2022)   predniSONE (DELTASONE) 10 MG tablet TAKE 4 TABS BY MOUTH FOR 1 DAY, THEN 3 TABS FOR 1 DAY, THEN 2 TABS FOR 1 DAY, THEN 1 TAB FOR 1 DAY, THEN STOP (Patient not taking: Reported on 01/16/2022)   predniSONE (DELTASONE) 20 MG tablet TAKE 2 TABLETS BY  MOUTH ONCE DAILY WITH BREAKFAST FOR 7 DAYS (Patient not taking: Reported on 01/16/2022)   promethazine-dextromethorphan (PROMETHAZINE-DM) 6.25-15 MG/5ML syrup TAKE 5 ML BY MOUTH 4 TIMES DAILY AS NEEDED FOR COUGH (Patient not taking: Reported on 01/16/2022)   rivaroxaban (XARELTO) 20 MG TABS tablet  (Patient not taking: Reported on 01/16/2022)   tiZANidine (ZANAFLEX) 2 MG tablet TAKE 1 TABLET BY MOUTH EVERY 6 HOURS AS NEEDED FOR MUSCLE SPASM (Patient not taking: Reported on 01/16/2022)   tiZANidine (ZANAFLEX) 4 MG tablet Take 1 tablet (4 mg total) by mouth every 6 (six) hours as needed for muscle spasms. (Patient not taking: Reported on 01/16/2022)   tolterodine (DETROL LA) 4 MG 24 hr capsule Take 1 capsule by mouth daily. (Patient not taking: Reported on 01/16/2022)   [DISCONTINUED] estradiol (ESTRACE) 1 MG tablet Take 1 tablet by mouth daily.   [  DISCONTINUED] LORazepam (ATIVAN) 1 MG tablet Take 1 tablet by mouth 2 (two) times daily as needed.   [DISCONTINUED] metFORMIN (GLUCOPHAGE) 500 MG tablet Take 1 tablet by mouth daily with breakfast.   No facility-administered encounter medications on file as of 01/16/2022.    Allergies (verified) Tramadol hcl, Codeine, Tape, and Vicodin [hydrocodone-acetaminophen]   History: Past Medical History:  Diagnosis Date   ALLERGIC RHINITIS 08/16/2008   ANXIETY 08/16/2008   Arthritis    hands, knees, lower back   ASTHMA 08/16/2008   Asthma    BRONCHITIS     DR. 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   GERD (gastroesophageal reflux disease)    H/O hiatal hernia    HEMATOCHEZIA 06/20/2009   Hemorrhoids    History of cardioversion 10/30/14   History of kidney stones    HYPERLIPIDEMIA 08/16/2008   HYPERTENSION  12/03/2006   Hypertension    Impaired glucose tolerance 09/21/2010   Overactive bladder 10/20/2015   Pneumonia    as a child   Pulmonary HTN ([place]) 06/19/2015   Shortness of breath dyspnea    Sleep apnea    MILD NO CPAP ORDERED   Snoring 06/19/2015   Past Surgical History:  Procedure Laterality Date   ABDOMINAL HYSTERECTOMY     APPENDECTOMY     CARDIOVERSION N/A 03/20/2014   Procedure: CARDIOVERSION;  Surgeon: Josue Hector, MD;  Location: [place];  Service: Cardiovascular;  Laterality: N/A;   CARDIOVERSION N/A 10/30/2014   Procedure: CARDIOVERSION;  Surgeon: Josue Hector, MD;  Location: [place];  Service: Cardiovascular;  Laterality: N/A;   CARDIOVERSION N/A 04/03/2019   Procedure: CARDIOVERSION;  Surgeon: Dorothy Spark, MD;  Location: [place];  Service: Cardiovascular;  Laterality: N/A;   CARPAL TUNNEL RELEASE     Right + LEFT   CESAREAN SECTION     x 1    COLONOSCOPY  2004   CYST EXCISION     RT ARM    CYSTOCELE REPAIR N/A 10/25/2012   Procedure: ANTERIOR REPAIR (CYSTOCELE);  Surgeon: Gus Height, MD;  Location: [facility];  Service: Gynecology;  Laterality: N/A;   HEEL SPUR SURGERY Bilateral    KNEE SURGERY     Right + LEFT   LUMBAR LAMINECTOMY/DECOMPRESSION MICRODISCECTOMY N/A 03/19/2016   Procedure: Laminectomy and Foraminotomy - Lumbar three-four - Lumbar four-five;  Surgeon: Eustace Moore, MD;  Location: [place];  Service: Neurosurgery;  Laterality: N/A;   RADIOLOGY WITH ANESTHESIA Right 11/01/2014   Procedure: MRI RIGHT FOREARM;  Surgeon: Medication Radiologist, MD;  Location: [place];  Service: Radiology;  Laterality: Right;   RADIOLOGY WITH ANESTHESIA Right 08/29/2015   Procedure: MRI - RIGHT FOREARM;  Surgeon: Medication Radiologist, MD;  Location: [place];  Service: Radiology;  Laterality: Right;   RADIOLOGY WITH ANESTHESIA N/A 12/05/2015   Procedure: MRI SPINE WITHOUT;  Surgeon: Medication Radiologist, MD;  Location: [place];  Service: Radiology;  Laterality: N/A;    SKIN CANCER EXCISION     BILAT SHOULDERS   SPLIT NIGHT STUDY  09/04/2015   TONSILLECTOMY AND ADENOIDECTOMY     Family History  Problem Relation Age of Onset  Diabetes Mother    Hyperlipidemia Mother    Hypertension Mother    Colon polyps Mother    Stroke Father    Colon cancer Neg Hx    Social History   Socioeconomic History   Marital status: Widowed    Spouse name: Not on file   Number of children: 1   Years of education: Not on file   Highest education level: Not on file  Occupational History   Occupation: currently unemployed, former [employer] Engineer, manufacturing systems  Tobacco Use   Smoking status: Former    Packs/day: 1.50    Years: 30.00    Total pack years: 45.00    Types: Cigarettes    Quit date: 04/21/2003    Years since quitting: 18.7   Smokeless tobacco: Never  Vaping Use   Vaping Use: Never used  Substance and Sexual Activity   Alcohol use: Yes    Alcohol/week: 2.0 standard drinks of alcohol    Types: 2 Standard drinks or equivalent per week    Comment: OCCAS   Drug use: No   Sexual activity: Not Currently    Birth control/protection: None  Other Topics Concern   Not on file  Social History Narrative   3 caffeine drinks daily    Social Determinants of Health   Financial Resource Strain: Low Risk  (01/16/2022)   Overall Financial Resource Strain (CARDIA)    Difficulty of Paying Living Expenses: Not hard at all  Food Insecurity: No Food Insecurity (01/16/2022)   Hunger Vital Sign    Worried About Running Out of Food in the Last Year: Never true    Ran Out of Food in the Last Year: Never true  Transportation Needs: No Transportation Needs (01/16/2022)   PRAPARE - Hydrologist (Medical): No    Lack of Transportation (Non-Medical): No  Physical Activity: Inactive (01/16/2022)   Exercise Vital Sign    Days of Exercise per Week: 0 days    Minutes of Exercise per Session: 0 min  Stress: No Stress Concern Present (01/16/2022)   [place]    Feeling of Stress : Not at all  Social Connections: Socially Isolated (01/16/2022)   Social Connection and Isolation Panel [NHANES]    Frequency of Communication with Friends and Family: More than three times a week    Frequency of Social Gatherings with Friends and Family: Once a week    Attends Religious Services: Never    Marine scientist or Organizations: No    Attends Archivist Meetings: Never    Marital Status: Widowed    Tobacco Counseling Counseling given: Not Answered   Clinical Intake:  Pre-visit preparation completed: Yes  Pain : No/denies pain     Diabetes: Yes CBG done?: No Did pt. bring in CBG monitor from home?: No  How often do you need to have someone help you when you read instructions, pamphlets, or other written materials from your doctor or pharmacy?: 1 - Never  Diabetic?yes Nutrition Risk Assessment:  Has the patient had any N/V/D within the last 2 months?  No  Does the patient have any non-healing wounds?  No  Has the patient had any unintentional weight loss or weight gain?  No   Diabetes:  Is the patient diabetic?  Yes  If diabetic, was a CBG obtained today?  No  Did the patient bring in their glucometer from home?  No  How often  do you monitor your CBG's? Twice/day   Financial Strains and Diabetes Management:  Are you having any financial strains with the device, your supplies or your medication? No .  Does the patient want to be seen by Chronic Care Management for management of their diabetes?  No  Would the patient like to be referred to a Nutritionist or for Diabetic Management?  No   Diabetic Exams:  Diabetic Eye Exam: Completed 10/19/19. Overdue for diabetic eye exam. Pt has been advised about the importance in completing this exam.   Diabetic Foot Exam: Completed 07/18/21. Pt has been advised about the importance in completing this exam.    Interpreter Needed?: No  Information entered by :: Kirke Shaggy, LPN   Activities of Daily Living    01/16/2022    3:10 PM  In your present state of health, do you have any difficulty performing the following activities:  Hearing? 0  Vision? 0  Difficulty concentrating or making decisions? 0  Walking or climbing stairs? 0  Dressing or bathing? 0  Doing errands, shopping? 0  Preparing Food and eating ? N  Using the Toilet? N  In the past six months, have you accidently leaked urine? N  Do you have problems with loss of bowel control? N  Managing your Medications? N  Managing your Finances? N  Housekeeping or managing your Housekeeping? N    Patient Care Team: Biagio Borg, MD as PCP - General Johnsie Cancel Wallis Bamberg, MD as PCP - Cardiology (Cardiology)  Indicate any recent Medical Services you may have received from other than [facility] providers in the past year (date may be approximate).     Assessment:   This is a routine wellness examination for Andrea Perry.  Hearing/Vision screen Hearing Screening - Comments:: No aids Vision Screening - Comments:: Wears glasses- Dr.McCuen  Dietary issues and exercise activities discussed: Current Exercise Habits: The patient does not participate in regular exercise at present   Goals Addressed             This Visit's Progress    DIET - EAT MORE FRUITS AND VEGETABLES         Depression Screen    01/16/2022    3:07 PM 07/18/2021    1:53 PM 07/18/2021    1:22 PM 01/09/2021   11:49 AM 07/09/2020    2:03 PM 03/29/2019    1:53 PM 02/28/2019   12:08 PM  PHQ 2/9 Scores  PHQ - 2 Score 0 1 0 0 0 1 1  PHQ- 9 Score 0          Fall Risk    01/16/2022    3:10 PM 07/18/2021    1:52 PM 07/18/2021    1:22 PM 01/09/2021   11:48 AM 07/09/2020    2:03 PM  [place] in the past year? 0 0 0 0 0  Number falls in past yr: 0 0 0 0   Injury with Fall? 0 0 0 0   Risk for fall due to : No Fall Risks      Follow up Falls prevention  discussed;Falls evaluation completed        FALL RISK PREVENTION PERTAINING TO THE HOME:  Any stairs in or around the home? No  If so, are there any without handrails? No  Home free of loose throw rugs in walkways, pet beds, electrical cords, etc? Yes  Adequate lighting in your home to reduce risk of falls? Yes   ASSISTIVE DEVICES  UTILIZED TO PREVENT FALLS:  Life alert? No  Use of a cane, walker or w/c? No  Grab bars in the bathroom? No  Shower chair or bench in shower? Yes  Elevated toilet seat or a handicapped toilet? No    Cognitive Function:        01/16/2022    3:12 PM  6CIT Screen  What Year? 0 points  What month? 0 points  What time? 0 points  Count back from 20 0 points  Months in reverse 0 points  Repeat phrase 2 points  Total Score 2 points    Immunizations Immunization History  Administered Date(s) Administered   Fluad Quad(high Dose 65+) 02/09/2019, 06/11/2020, 01/09/2021   Influenza, High Dose Seasonal PF 03/23/2018   Influenza,inj,Quad PF,6+ Mos 03/01/2013, 02/14/2014, 03/08/2015, 04/16/2016, 01/12/2017   Influenza-Unspecified 01/18/2017   Pneumococcal Conjugate-13 03/30/2013   Pneumococcal Polysaccharide-23 04/04/2014, 07/09/2020   Tdap 09/24/2010    TDAP status: Due, Education has been provided regarding the importance of this vaccine. Advised may receive this vaccine at local pharmacy or Health Dept. Aware to provide a copy of the vaccination record if obtained from local pharmacy or Health Dept. Verbalized acceptance and understanding.  Flu Vaccine status: Up to date  Pneumococcal vaccine status: Up to date  Covid-19 vaccine status: Declined, Education has been provided regarding the importance of this vaccine but patient still declined. Advised may receive this vaccine at local pharmacy or Health Dept.or vaccine clinic. Aware to provide a copy of the vaccination record if obtained from local pharmacy or Health Dept. Verbalized acceptance and  understanding.  Qualifies for Shingles Vaccine? Yes   Zostavax completed No   Shingrix Completed?: No.    Education has been provided regarding the importance of this vaccine. Patient has been advised to call insurance company to determine out of pocket expense if they have not yet received this vaccine. Advised may also receive vaccine at local pharmacy or Health Dept. Verbalized acceptance and understanding.  Screening Tests Health Maintenance  Topic Date Due   Zoster Vaccines- Shingrix (1 of 2) Never done   TETANUS/TDAP  09/23/2020   OPHTHALMOLOGY EXAM  10/18/2020   COLONOSCOPY (Pts 45-22yr Insurance coverage will need to be confirmed)  11/16/2020   INFLUENZA VACCINE  11/18/2021   MAMMOGRAM  03/03/2022 (Originally 03/04/2018)   HEMOGLOBIN A1C  01/17/2022   Diabetic kidney evaluation - GFR measurement  07/19/2022   Diabetic kidney evaluation - Urine ACR  07/19/2022   FOOT EXAM  07/19/2022   Pneumonia Vaccine 71 Years old  Completed   DEXA SCAN  Completed   Hepatitis C Screening  Completed   HPV VACCINES  Aged Out   COVID-19 Vaccine  Discontinued    Health Maintenance  Health Maintenance Due  Topic Date Due   Zoster Vaccines- Shingrix (1 of 2) Never done   TETANUS/TDAP  09/23/2020   OPHTHALMOLOGY EXAM  10/18/2020   COLONOSCOPY (Pts 45-475yrInsurance coverage will need to be confirmed)  11/16/2020   INFLUENZA VACCINE  11/18/2021    Declined referral for colonoscopy  Declined referral for mammogram    Lung Cancer Screening: (Low Dose CT Chest recommended if Age 69-80ears, 30 pack-year currently smoking OR have quit w/in 15years.) does not qualify.   Additional Screening:  Hepatitis C Screening: does qualify; Completed 10/16/15  Vision Screening: Recommended annual ophthalmology exams for early detection of glaucoma and other disorders of the eye. Is the patient up to date with their annual eye exam?  Yes  Who  is the provider or what is the name of the office in  which the patient attends annual eye exams? Dr.McCuen If pt is not established with a provider, would they like to be referred to a provider to establish care? No .   Dental Screening: Recommended annual dental exams for proper oral hygiene  Community Resource Referral / Chronic Care Management: CRR required this visit?  No   CCM required this visit?  No      Plan:     I have personally reviewed and noted the following in the patient's chart:   Medical and social history Use of alcohol, tobacco or illicit drugs  Current medications and supplements including opioid prescriptions. Patient is not currently taking opioid prescriptions. Functional ability and status Nutritional status Physical activity Advanced directives List of other physicians Hospitalizations, surgeries, and ER visits in previous 12 months Vitals Screenings to include cognitive, depression, and falls Referrals and appointments  In addition, I have reviewed and discussed with patient certain preventive protocols, quality metrics, and best practice recommendations. A written personalized care plan for preventive services as well as general preventive health recommendations were provided to patient.     Dionisio David, LPN   [id number]   Nurse Notes: none

## 2022-01-19 ENCOUNTER — Ambulatory Visit: Payer: Medicare Other | Admitting: Internal Medicine

## 2022-01-22 ENCOUNTER — Other Ambulatory Visit: Payer: Self-pay | Admitting: Internal Medicine

## 2022-01-22 NOTE — Telephone Encounter (Signed)
Please refill as per office routine med refill policy (all routine meds to be refilled for 3 mo or monthly (per pt preference) up to one year from last visit, then month to month grace period for 3 mo, then further med refills will have to be denied) ? ?

## 2022-01-23 ENCOUNTER — Telehealth: Payer: Self-pay | Admitting: *Deleted

## 2022-01-23 NOTE — Telephone Encounter (Signed)
ATC patient to let her know that her Breztri inhalers had arrived from [place].  No answer.  VM full.  Inhalers placed up at the front in the cabinet where we keep the samples.  Will attempt to reach at a later time.

## 2022-01-23 NOTE — Telephone Encounter (Signed)
Per Maryann Conners Recruitment consultant), advised to get patient's address and mail inhalers that she has not picked up (total of 6 now) from [place].  We have attempted to contact her on numerous occasions without success.  She does not use mychart so I am unable to contact her on mychart.  Nothing further needed.

## 2022-01-29 ENCOUNTER — Ambulatory Visit: Payer: Medicare Other | Admitting: Internal Medicine

## 2022-02-03 ENCOUNTER — Ambulatory Visit: Payer: Medicare Other | Admitting: Sports Medicine

## 2022-02-04 ENCOUNTER — Ambulatory Visit (INDEPENDENT_AMBULATORY_CARE_PROVIDER_SITE_OTHER): Payer: Medicare Other | Admitting: Internal Medicine

## 2022-02-04 VITALS — BP 146/90 | HR 78 | Temp 97.9°F | Ht 62.0 in | Wt 268.0 lb

## 2022-02-04 DIAGNOSIS — E785 Hyperlipidemia, unspecified: Secondary | ICD-10-CM

## 2022-02-04 DIAGNOSIS — R053 Chronic cough: Secondary | ICD-10-CM | POA: Diagnosis not present

## 2022-02-04 DIAGNOSIS — E1165 Type 2 diabetes mellitus with hyperglycemia: Secondary | ICD-10-CM | POA: Diagnosis not present

## 2022-02-04 DIAGNOSIS — E559 Vitamin D deficiency, unspecified: Secondary | ICD-10-CM

## 2022-02-04 DIAGNOSIS — Z23 Encounter for immunization: Secondary | ICD-10-CM | POA: Diagnosis not present

## 2022-02-04 DIAGNOSIS — E039 Hypothyroidism, unspecified: Secondary | ICD-10-CM | POA: Diagnosis not present

## 2022-02-04 DIAGNOSIS — Z1211 Encounter for screening for malignant neoplasm of colon: Secondary | ICD-10-CM

## 2022-02-04 DIAGNOSIS — I10 Essential (primary) hypertension: Secondary | ICD-10-CM

## 2022-02-04 LAB — HEPATIC FUNCTION PANEL
ALT: 13 U/L (ref 0–35)
AST: 16 U/L (ref 0–37)
Albumin: 3.6 g/dL (ref 3.5–5.2)
Alkaline Phosphatase: 78 U/L (ref 39–117)
Bilirubin, Direct: 0.1 mg/dL (ref 0.0–0.3)
Total Bilirubin: 0.5 mg/dL (ref 0.2–1.2)
Total Protein: 6.5 g/dL (ref 6.0–8.3)

## 2022-02-04 LAB — LIPID PANEL
Cholesterol: 100 mg/dL (ref 0–200)
HDL: 42.9 mg/dL (ref >39.00)
LDL Cholesterol: 32 mg/dL (ref 0–99)
NonHDL: 57.31
Total CHOL/HDL Ratio: 2
Triglycerides: 126 mg/dL (ref 0.0–149.0)
VLDL: 25.2 mg/dL (ref 0.0–40.0)

## 2022-02-04 LAB — CBC WITH DIFFERENTIAL/PLATELET
Basophils Absolute: 0.1 10*3/uL (ref 0.0–0.1)
Basophils Relative: 0.7 % (ref 0.0–3.0)
Eosinophils Absolute: 0.1 10*3/uL (ref 0.0–0.7)
Eosinophils Relative: 1.9 % (ref 0.0–5.0)
HCT: 33.1 % — ABNORMAL LOW (ref 36.0–46.0)
Hemoglobin: 10.6 g/dL — ABNORMAL LOW (ref 12.0–15.0)
Lymphocytes Relative: 20.6 % (ref 12.0–46.0)
Lymphs Abs: 1.5 10*3/uL (ref 0.7–4.0)
MCHC: 32.1 g/dL (ref 30.0–36.0)
MCV: 84.5 fl (ref 78.0–100.0)
Monocytes Absolute: 0.5 10*3/uL (ref 0.1–1.0)
Monocytes Relative: 6.7 % (ref 3.0–12.0)
Neutro Abs: 5.2 10*3/uL (ref 1.4–7.7)
Neutrophils Relative %: 70.1 % (ref 43.0–77.0)
Platelets: 218 10*3/uL (ref 150.0–400.0)
RBC: 3.92 Mil/uL (ref 3.87–5.11)
RDW: 18.5 % — ABNORMAL HIGH (ref 11.5–15.5)
WBC: 7.4 10*3/uL (ref 4.0–10.5)

## 2022-02-04 LAB — BASIC METABOLIC PANEL
BUN: 16 mg/dL (ref 6–23)
CO2: 30 mEq/L (ref 19–32)
Calcium: 8.8 mg/dL (ref 8.4–10.5)
Chloride: 102 mEq/L (ref 96–112)
Creatinine, Ser: 0.95 mg/dL (ref 0.40–1.20)
GFR: 61.31 mL/min (ref >60.00)
Glucose, Bld: 132 mg/dL — ABNORMAL HIGH (ref 70–99)
Potassium: 4.3 mEq/L (ref 3.5–5.1)
Sodium: 139 mEq/L (ref 135–145)

## 2022-02-04 LAB — TSH: TSH: 7.01 u[IU]/mL — ABNORMAL HIGH (ref 0.35–5.50)

## 2022-02-04 LAB — VITAMIN D 25 HYDROXY (VIT D DEFICIENCY, FRACTURES): VITD: 25.33 ng/mL — ABNORMAL LOW (ref 30.00–100.00)

## 2022-02-04 LAB — MAGNESIUM: Magnesium: 1.3 mg/dL — ABNORMAL LOW (ref 1.5–2.5)

## 2022-02-04 MED ORDER — LEVOTHYROXINE SODIUM 25 MCG PO TABS: ORAL_TABLET | ORAL | 3 refills | Status: DC

## 2022-02-04 MED ORDER — RYBELSUS 7 MG PO TABS: 7.0000 mg | ORAL_TABLET | Freq: Every day | ORAL | 3 refills | Status: DC

## 2022-02-04 MED ORDER — DOXYCYCLINE HYCLATE 100 MG PO TABS: 100.0000 mg | ORAL_TABLET | Freq: Two times a day (BID) | ORAL | 0 refills | Status: DC

## 2022-02-04 MED ORDER — ESCITALOPRAM OXALATE 20 MG PO TABS: 20.0000 mg | ORAL_TABLET | Freq: Every day | ORAL | 3 refills | Status: DC

## 2022-02-04 MED ORDER — AMLODIPINE BESYLATE 10 MG PO TABS: 10.0000 mg | ORAL_TABLET | Freq: Every day | ORAL | 3 refills | Status: DC

## 2022-02-04 MED ORDER — LORAZEPAM 1 MG PO TABS: 1.0000 mg | ORAL_TABLET | Freq: Two times a day (BID) | ORAL | 2 refills | Status: DC | PRN

## 2022-02-04 MED ORDER — RYBELSUS 3 MG PO TABS: 3.0000 mg | ORAL_TABLET | Freq: Every day | ORAL | 0 refills | Status: DC

## 2022-02-04 MED ORDER — ROSUVASTATIN CALCIUM 40 MG PO TABS: 40.0000 mg | ORAL_TABLET | Freq: Every day | ORAL | 0 refills | Status: DC

## 2022-02-04 MED ORDER — PREDNISONE 10 MG PO TABS: ORAL_TABLET | ORAL | 0 refills | Status: DC

## 2022-02-04 MED ORDER — HYDROCODONE BIT-HOMATROP MBR 5-1.5 MG/5ML PO SOLN: 5.0000 mL | Freq: Four times a day (QID) | ORAL | 0 refills | Status: AC | PRN

## 2022-02-04 MED ORDER — FUROSEMIDE 40 MG PO TABS: 40.0000 mg | ORAL_TABLET | Freq: Every day | ORAL | 3 refills | Status: DC

## 2022-02-04 MED ORDER — TOLTERODINE TARTRATE ER 4 MG PO CP24: 4.0000 mg | ORAL_CAPSULE | Freq: Every day | ORAL | 3 refills | Status: AC

## 2022-02-04 MED ORDER — IRBESARTAN 150 MG PO TABS: 150.0000 mg | ORAL_TABLET | Freq: Every day | ORAL | 3 refills | Status: DC

## 2022-02-04 MED ORDER — METFORMIN HCL ER 500 MG PO TB24: ORAL_TABLET | ORAL | 3 refills | Status: DC

## 2022-02-04 NOTE — Patient Instructions (Signed)
You had the flu shot today  Please take all new medication as prescribed - the antibiotic, cough medicine and prednisone  Please take all new medication as prescribed  - the rybelsus 3 mg for 1 mo, then 7 mg per day after that  Please continue all other medications as before, and refills have been done if requested.  Please have the pharmacy call with any other refills you may need.  Please continue your efforts at being more active, low cholesterol diet, and weight control  Please keep your appointments with your specialists as you may have planned  You will be contacted regarding the referral for: [email or web address]  Please go to the LAB at the blood drawing area for the tests to be done  You will be contacted by phone if any changes need to be made immediately.  Otherwise, you will receive a letter about your results with an explanation, but please check with MyChart first.  Please remember to sign up for MyChart if you have not done so, as this will be important to you in the future with finding out test results, communicating by private email, and scheduling acute appointments online when needed.  Please make an Appointment to return in 6 months, or sooner if needed

## 2022-02-04 NOTE — Progress Notes (Unsigned)
Patient ID: Andrea Perry, female   DOB: November 20, 1950, 71 y.o.   MRN: [id number]        Chief Complaint: follow up HTN, HLD and hyperglycemia, low magnesium, low vit d, bronchitis, wheezing       HPI:  [name] is a 71 y.o. female Here with acute onset mild to mod 2-3 days ST, HA, general weakness and malaise, with prod cough greenish sputum, but Pt denies chest pain, increased sob or doe, wheezing, orthopnea, PND, increased LE swelling, palpitations, dizziness or syncope, except for onset mild wheezing and sob since last pm.   Pt denies polydipsia, polyuria, or new focal neuro s/s.    Pt denies fever, wt loss, night sweats, loss of appetite, or other constitutional symptoms  Due for cologuard.  Due for flu shot.  Wt Readings from Last 3 Encounters:  02/04/22 268 lb (121.6 kg)  01/16/22 268 lb (121.6 kg)  11/05/21 268 lb 12.8 oz (121.9 kg)   BP Readings from Last 3 Encounters:  02/04/22 (!) 146/90  11/05/21 136/78  07/18/21 122/80         Past Medical History:  Diagnosis Date   ALLERGIC RHINITIS 08/16/2008   ANXIETY 08/16/2008   Arthritis    hands, knees, lower back   ASTHMA 08/16/2008   Asthma    BRONCHITIS     DR. Lamonte Sakai     Bladder leak    Cancer (HCC)    MELANOMA       Chronic lower back pain    problems with disc L2-5   COPD 08/16/2008   DEPRESSION 08/16/2008   Diabetes mellitus without complication ([place])    Dysrhythmia    hx AF   Excessive daytime sleepiness 06/19/2015   GENITAL HERPES 12/03/2006   GERD (gastroesophageal reflux disease)    H/O hiatal hernia    HEMATOCHEZIA 06/20/2009   Hemorrhoids    History of cardioversion 10/30/14   History of kidney stones    HYPERLIPIDEMIA 08/16/2008   HYPERTENSION 12/03/2006   Hypertension    Impaired glucose tolerance 09/21/2010   Overactive bladder 10/20/2015   Pneumonia    as a child   Pulmonary HTN ([place]) 06/19/2015   Shortness of breath dyspnea    Sleep apnea    MILD NO CPAP ORDERED   Snoring 06/19/2015   Past Surgical History:   Procedure Laterality Date   ABDOMINAL HYSTERECTOMY     APPENDECTOMY     CARDIOVERSION N/A 03/20/2014   Procedure: CARDIOVERSION;  Surgeon: Josue Hector, MD;  Location: [facility];  Service: Cardiovascular;  Laterality: N/A;   CARDIOVERSION N/A 10/30/2014   Procedure: CARDIOVERSION;  Surgeon: Josue Hector, MD;  Location: [facility]COPY;  Service: Cardiovascular;  Laterality: N/A;   CARDIOVERSION N/A 04/03/2019   Procedure: CARDIOVERSION;  Surgeon: Dorothy Spark, MD;  Location: [place];  Service: Cardiovascular;  Laterality: N/A;   CARPAL TUNNEL RELEASE     Right + LEFT   CESAREAN SECTION     x 1    COLONOSCOPY  2004   CYST EXCISION     RT ARM    CYSTOCELE REPAIR N/A 10/25/2012   Procedure: ANTERIOR REPAIR (CYSTOCELE);  Surgeon: Gus Height, MD;  Location: [facility];  Service: Gynecology;  Laterality: N/A;   HEEL SPUR SURGERY Bilateral    KNEE SURGERY     Right + LEFT   LUMBAR LAMINECTOMY/DECOMPRESSION MICRODISCECTOMY N/A 03/19/2016   Procedure: Laminectomy and Foraminotomy - Lumbar three-four - Lumbar four-five;  Surgeon: Eustace Moore,  MD;  Location: [place];  Service: Neurosurgery;  Laterality: N/A;   RADIOLOGY WITH ANESTHESIA Right 11/01/2014   Procedure: MRI RIGHT FOREARM;  Surgeon: Medication Radiologist, MD;  Location: [place];  Service: Radiology;  Laterality: Right;   RADIOLOGY WITH ANESTHESIA Right 08/29/2015   Procedure: MRI - RIGHT FOREARM;  Surgeon: Medication Radiologist, MD;  Location: [place];  Service: Radiology;  Laterality: Right;   RADIOLOGY WITH ANESTHESIA N/A 12/05/2015   Procedure: MRI SPINE WITHOUT;  Surgeon: Medication Radiologist, MD;  Location: [place];  Service: Radiology;  Laterality: N/A;   SKIN CANCER EXCISION     BILAT SHOULDERS   SPLIT NIGHT STUDY  09/04/2015   TONSILLECTOMY AND ADENOIDECTOMY      reports that she quit smoking about 18 years ago. Her smoking use included cigarettes. She has a 45.00 pack-year smoking history. She has never used smokeless  tobacco. She reports current alcohol use of about 2.0 standard drinks of alcohol per week. She reports that she does not use drugs. family history includes Colon polyps in her mother; Diabetes in her mother; Hyperlipidemia in her mother; Hypertension in her mother; Stroke in her father. Allergies  Allergen Reactions   Tramadol Hcl Nausea And Vomiting and Other (See Comments)     mouth dryness, headache   Other Nausea Only   Codeine Nausea And Vomiting and Nausea Only   Tape Rash    Plastic tape, bandaids and ekg leads, causes redness and rash Other reaction(s): Not available   Vicodin [Hydrocodone-Acetaminophen] Nausea And Vomiting   Current Outpatient Medications on File Prior to Visit  Medication Sig Dispense Refill   acetaminophen (TYLENOL) 500 MG tablet Take 500 mg by mouth every 6 (six) hours as needed for mild pain or moderate pain.     albuterol (PROVENTIL) (2.5 MG/3ML) 0.083% nebulizer solution USE EVERY 4 HOURS WITH IPRATROPIUM 300 mL 12   albuterol (VENTOLIN HFA) 108 (90 Base) MCG/ACT inhaler Inhale 2 puffs by mouth every 6 hours as needed for wheezing or shortness of breath. 18 g 5   apixaban (ELIQUIS) 5 MG TABS tablet Take 1 tablet by mouth twice daily 60 tablet 5   Blood Glucose Monitoring Suppl (FREESTYLE LITE) DEVI Use as directed daily E11.9 1 each 0   Budeson-Glycopyrrol-Formoterol (BREZTRI AEROSPHERE) 160-9-4.8 MCG/ACT AERO Inhale 2 puffs into the lungs 2 (two) times daily. 10.7 g 11   cetirizine (ZYRTEC) 10 MG tablet Take 10 mg by mouth daily.     cyclobenzaprine (FLEXERIL) 5 MG tablet      diphenoxylate-atropine (LOMOTIL) 2.5-0.025 MG tablet Take 1 tablet by mouth 4 (four) times daily as needed for diarrhea or loose stools. 40 tablet 0   estradiol (ESTRACE) 1 MG tablet Take 1 mg by mouth daily.     flecainide (TAMBOCOR) 50 MG tablet TAKE 1 TABLET BY MOUTH TWICE DAILY PLEASE KEEP UPCOMING APPOINTMENT FOR FURTHER REFILLS THANK YOU     fluorouracil (EFUDEX) 5 % cream       fluticasone (FLONASE) 50 MCG/ACT nasal spray      gabapentin (NEURONTIN) 300 MG capsule      glucose blood (FREESTYLE LITE) test strip Use as instructed once daily E11.9 100 each 12   guaiFENesin (MUCINEX) 600 MG 12 hr tablet Take 600 mg by mouth 2 (two) times daily as needed for cough.     ipratropium (ATROVENT) 0.02 % nebulizer solution INHALE 1 VIAL IN NEBULIZER 4 TIMES DAILY WITH  ALBUTEROL 300 mL 11   ipratropium-albuterol (DUONEB) 0.5-2.5 (3) MG/3ML SOLN  USE 1 AMPULE IN NEBULIZER 4 TIMES DAILY 360 mL 5   ketoconazole (NIZORAL) 2 % cream      Lancets MISC Use as directed once daily E11.9 100 each 11   metoprolol tartrate (LOPRESSOR) 100 MG tablet Take 1 tablet by mouth twice daily 180 tablet 3   metoprolol tartrate (LOPRESSOR) 50 MG tablet TAKE 1 TABLETS BY MOUTH TWICE DAILY     naproxen (NAPROSYN) 500 MG tablet      nystatin powder      pantoprazole (PROTONIX) 40 MG tablet Take 1 tablet by mouth daily.     penicillin v potassium (VEETID) 500 MG tablet      potassium chloride SA (KLOR-CON) 20 MEQ tablet Take 1 tablet (20 mEq total) by mouth daily. 90 tablet 3   pravastatin (PRAVACHOL) 40 MG tablet Take 1 tablet by mouth daily.     promethazine-dextromethorphan (PROMETHAZINE-DM) 6.25-15 MG/5ML syrup      rivaroxaban (XARELTO) 20 MG TABS tablet      tiZANidine (ZANAFLEX) 4 MG tablet Take 1 tablet (4 mg total) by mouth every 6 (six) hours as needed for muscle spasms. 60 tablet 2   calcium-vitamin D (OSCAL 500/200 D-3) 500-200 MG-UNIT tablet Take 1 tablet by mouth 2 (two) times daily. 60 tablet 2   No current facility-administered medications on file prior to visit.        ROS:  All others reviewed and negative.  Objective        PE:  BP (!) 146/90 (BP Location: Right Arm, Patient Position: Sitting, Cuff Size: Large)   Pulse 78   Temp 97.9 F (36.6 C) (Oral)   Ht '5\' 2"'$  (1.575 m)   Wt 268 lb (121.6 kg)   SpO2 97%   BMI 49.02 kg/m                 Constitutional: Pt appears in  NAD               HENT: Head: NCAT.                Right Ear: External ear normal.                 Left Ear: External ear normal.                Eyes: . Pupils are equal, round, and reactive to light. Conjunctivae and EOM are normal               Nose: without d/c or deformity               Neck: Neck supple. Gross normal ROM               Cardiovascular: Normal rate and regular rhythm.                 Pulmonary/Chest: Effort normal and breath sounds decreased without rales but with mild wheezing.                              Neurological: Pt is alert. At baseline orientation, motor grossly intact               Skin: Skin is warm. No rashes, no other new lesions, LE edema - none               Psychiatric: Pt behavior is normal without agitation   Micro: none  Cardiac tracings I have personally interpreted  today:  none  Pertinent Radiological findings (summarize): none   Lab Results  Component Value Date   WBC 7.4 02/04/2022   HGB 10.6 (L) 02/04/2022   HCT 33.1 (L) 02/04/2022   PLT 218.0 02/04/2022   GLUCOSE 132 (H) 02/04/2022   CHOL 100 02/04/2022   TRIG 126.0 02/04/2022   HDL 42.90 02/04/2022   LDLDIRECT 111.0 07/09/2020   LDLCALC 32 02/04/2022   ALT 13 02/04/2022   AST 16 02/04/2022   NA 139 02/04/2022   K 4.3 02/04/2022   CL 102 02/04/2022   CREATININE 0.95 02/04/2022   BUN 16 02/04/2022   CO2 30 02/04/2022   TSH 7.01 (H) 02/04/2022   INR 1.00 03/18/2016   HGBA1C 7.0 (H) 02/04/2022   MICROALBUR 20.0 (H) 07/18/2021   Assessment/Plan:  ZENNIE AYARS is a 71 y.o. White or Caucasian [1] female with  has a past medical history of ALLERGIC RHINITIS (08/16/2008), ANXIETY (08/16/2008), Arthritis, ASTHMA (08/16/2008), Asthma, Bladder leak, Cancer ([place]), Chronic lower back pain, COPD (08/16/2008), DEPRESSION (08/16/2008), Diabetes mellitus without complication ([place]), Dysrhythmia, Excessive daytime sleepiness (06/19/2015), GENITAL HERPES (12/03/2006), GERD (gastroesophageal reflux  disease), H/O hiatal hernia, HEMATOCHEZIA (06/20/2009), Hemorrhoids, History of cardioversion (10/30/14), History of kidney stones, HYPERLIPIDEMIA (08/16/2008), HYPERTENSION (12/03/2006), Hypertension, Impaired glucose tolerance (09/21/2010), Overactive bladder (10/20/2015), Pneumonia, Pulmonary HTN ([place]) (06/19/2015), Shortness of breath dyspnea, Sleep apnea, and Snoring (06/19/2015).  Vitamin D deficiency Last vitamin D Lab Results  Component Value Date   VD25OH 25.33 (L) 02/04/2022   Low, to start oral replacement   Hypothyroidism Lab Results  Component Value Date   TSH 7.01 (H) 02/04/2022   Uncontrolled, for increased levothryoxine to 50 mcg qd   Hypomagnesemia Has been out of mag supplement - for f/u lab today  Essential hypertension BP Readings from Last 3 Encounters:  02/04/22 (!) 146/90  11/05/21 136/78  07/18/21 122/80   Uncontrolled today, likely reactive, pt to continue medical treatment lopressor 100 and 50 qd, norvasc 10 mg qd, avapro 150 qd as declines change   Elevated lipids Lab Results  Component Value Date   LDLCALC 32 02/04/2022   Stable, pt to continue current statin pravachol 40 mg qd   Diabetes Lab Results  Component Value Date   HGBA1C 7.0 (H) 02/04/2022   Uncontrolled,, pt to continue current medical treatment metformin ER 500 - 2 qd and rybelsus 3 mg then 7 mg qd  Cough C/w bronchitis vs pna, mild, declines cxr, now for zpack, cough med prn, and prednisone taper for mild wheezing  Followup: Return in about 6 months (around 08/06/2022).  Cathlean Cower, MD 02/05/2022 7:38 PM [facility]

## 2022-02-05 ENCOUNTER — Telehealth: Payer: Self-pay

## 2022-02-05 ENCOUNTER — Other Ambulatory Visit: Payer: Self-pay | Admitting: Internal Medicine

## 2022-02-05 ENCOUNTER — Ambulatory Visit: Payer: Medicare Other | Admitting: Sports Medicine

## 2022-02-05 ENCOUNTER — Encounter: Payer: Self-pay | Admitting: Internal Medicine

## 2022-02-05 LAB — HEMOGLOBIN A1C: Hgb A1c MFr Bld: 7 % — ABNORMAL HIGH (ref 4.6–6.5)

## 2022-02-05 MED ORDER — CHOLECALCIFEROL 50 MCG (2000 UT) PO TABS: ORAL_TABLET | ORAL | 99 refills | Status: AC

## 2022-02-05 MED ORDER — MAGNESIUM OXIDE -MG SUPPLEMENT 500 MG PO CAPS: ORAL_CAPSULE | ORAL | 11 refills | Status: AC

## 2022-02-05 MED ORDER — LEVOTHYROXINE SODIUM 50 MCG PO TABS: 50.0000 ug | ORAL_TABLET | Freq: Every day | ORAL | 3 refills | Status: DC

## 2022-02-05 NOTE — Assessment & Plan Note (Signed)
Lab Results  Component Value Date   LDLCALC 32 02/04/2022   Stable, pt to continue current statin pravachol 40 mg qd

## 2022-02-05 NOTE — Assessment & Plan Note (Signed)
BP Readings from Last 3 Encounters:  02/04/22 (!) 146/90  11/05/21 136/78  07/18/21 122/80   Uncontrolled today, likely reactive, pt to continue medical treatment lopressor 100 and 50 qd, norvasc 10 mg qd, avapro 150 qd as declines change

## 2022-02-05 NOTE — Telephone Encounter (Signed)
Patient is returning a call about lab results.  

## 2022-02-05 NOTE — Assessment & Plan Note (Addendum)
C/w bronchitis vs pna, mild, declines cxr, now for zpack, cough med prn, and prednisone taper for mild wheezing

## 2022-02-05 NOTE — Assessment & Plan Note (Signed)
Lab Results  Component Value Date   TSH 7.01 (H) 02/04/2022   Uncontrolled, for increased levothryoxine to 50 mcg qd

## 2022-02-05 NOTE — Assessment & Plan Note (Signed)
Has been out of mag supplement - for f/u lab today

## 2022-02-05 NOTE — Assessment & Plan Note (Addendum)
Lab Results  Component Value Date   HGBA1C 7.0 (H) 02/04/2022   Uncontrolled,, pt to continue current medical treatment metformin ER 500 - 2 qd and rybelsus 3 mg then 7 mg qd

## 2022-02-05 NOTE — Assessment & Plan Note (Signed)
Last vitamin D Lab Results  Component Value Date   VD25OH 25.33 (L) 02/04/2022   Low, to start oral replacement  

## 2022-02-09 ENCOUNTER — Telehealth: Payer: Self-pay | Admitting: Internal Medicine

## 2022-02-09 NOTE — Telephone Encounter (Signed)
Pt called to ask if she should throw out her previous thyroid medication now that provider increased her to 50 mcg.  Pt states she still has a lot of the 25 mcg left. Informed pt she may be able to take 2 pills if provider agrees. Forwarded to provider to review and contact patient with instructions.

## 2022-02-10 ENCOUNTER — Ambulatory Visit: Payer: Medicare Other | Admitting: Sports Medicine

## 2022-02-10 NOTE — Telephone Encounter (Signed)
Ok to take 2 of the 25 mcg per day if she has this leftover, then change to the 50 mcg.   thanks

## 2022-02-10 NOTE — Telephone Encounter (Signed)
Please advise, patient would like to discard previous dose of thyroid medication.

## 2022-02-10 NOTE — Telephone Encounter (Signed)
Was able to leave a detailed message regarding thyroid meds and advised patient to take 2 67mg  until gone and then switch to the 50.

## 2022-02-19 NOTE — Telephone Encounter (Signed)
Made pt aware of lab results and also medication changes per Dr. Gwynn Burly notes. Let pt know medication is sent to pharmacy and to call them for pick up  Patient was notified of results two weeks ago, other staff are making new encounters other than adding to current ones-AW

## 2022-02-24 ENCOUNTER — Ambulatory Visit: Payer: Medicare Other | Admitting: Sports Medicine

## 2022-04-08 ENCOUNTER — Telehealth: Payer: Self-pay | Admitting: *Deleted

## 2022-04-08 NOTE — Telephone Encounter (Signed)
Called and spoke with patient, let her know that we have her Andrea Perry in the office that was mailed from the manufacturer.  She said she did not know why they were mailing it to [place], they had been mailing it to her to start with.  I advised her to call the manufacture and make sure that she checked for it to be mailed to her home address and not our office.  She stated she would do so.  She asked if I would mail the inhalers to her home as she does not have transportation.  I verified her mailing address and let her know I would put it in the mail to her.  She said she thought she had a touch of the flu, she has had vomiting and diarrhea for the past few days.  I advised her to call her PCP office and make them aware and see if they want to do a virtual visit.  She verbalized understanding.  Nothing further needed.

## 2022-04-18 ENCOUNTER — Other Ambulatory Visit: Payer: Self-pay | Admitting: Emergency Medicine

## 2022-04-20 ENCOUNTER — Other Ambulatory Visit: Payer: Self-pay

## 2022-04-20 ENCOUNTER — Encounter (HOSPITAL_COMMUNITY): Payer: Self-pay | Admitting: Emergency Medicine

## 2022-04-20 ENCOUNTER — Inpatient Hospital Stay (HOSPITAL_COMMUNITY)
Admission: EM | Admit: 2022-04-20 | Discharge: 2022-04-26 | DRG: 189 | Disposition: A | Discharge status: Home Health | Payer: Medicare Other | Attending: Internal Medicine | Admitting: Internal Medicine

## 2022-04-20 ENCOUNTER — Emergency Department (HOSPITAL_COMMUNITY): Payer: Medicare Other

## 2022-04-20 DIAGNOSIS — B974 Respiratory syncytial virus as the cause of diseases classified elsewhere: Secondary | ICD-10-CM | POA: Diagnosis not present

## 2022-04-20 DIAGNOSIS — I272 Pulmonary hypertension, unspecified: Secondary | ICD-10-CM | POA: Diagnosis present

## 2022-04-20 DIAGNOSIS — I4819 Other persistent atrial fibrillation: Secondary | ICD-10-CM | POA: Diagnosis present

## 2022-04-20 DIAGNOSIS — Z8249 Family history of ischemic heart disease and other diseases of the circulatory system: Secondary | ICD-10-CM

## 2022-04-20 DIAGNOSIS — I5033 Acute on chronic diastolic (congestive) heart failure: Secondary | ICD-10-CM | POA: Diagnosis not present

## 2022-04-20 DIAGNOSIS — Z833 Family history of diabetes mellitus: Secondary | ICD-10-CM

## 2022-04-20 DIAGNOSIS — I110 Hypertensive heart disease with heart failure: Secondary | ICD-10-CM | POA: Diagnosis not present

## 2022-04-20 DIAGNOSIS — F32A Depression, unspecified: Secondary | ICD-10-CM | POA: Diagnosis not present

## 2022-04-20 DIAGNOSIS — J9600 Acute respiratory failure, unspecified whether with hypoxia or hypercapnia: Secondary | ICD-10-CM | POA: Diagnosis present

## 2022-04-20 DIAGNOSIS — J441 Chronic obstructive pulmonary disease with (acute) exacerbation: Secondary | ICD-10-CM | POA: Diagnosis present

## 2022-04-20 DIAGNOSIS — J440 Chronic obstructive pulmonary disease with acute lower respiratory infection: Secondary | ICD-10-CM | POA: Diagnosis not present

## 2022-04-20 DIAGNOSIS — I10 Essential (primary) hypertension: Secondary | ICD-10-CM | POA: Diagnosis present

## 2022-04-20 DIAGNOSIS — T380X5A Adverse effect of glucocorticoids and synthetic analogues, initial encounter: Secondary | ICD-10-CM | POA: Diagnosis present

## 2022-04-20 DIAGNOSIS — Z7901 Long term (current) use of anticoagulants: Secondary | ICD-10-CM

## 2022-04-20 DIAGNOSIS — Z1152 Encounter for screening for COVID-19: Secondary | ICD-10-CM | POA: Diagnosis not present

## 2022-04-20 DIAGNOSIS — R059 Cough, unspecified: Secondary | ICD-10-CM | POA: Diagnosis not present

## 2022-04-20 DIAGNOSIS — G4733 Obstructive sleep apnea (adult) (pediatric): Secondary | ICD-10-CM | POA: Diagnosis not present

## 2022-04-20 DIAGNOSIS — Z85820 Personal history of malignant melanoma of skin: Secondary | ICD-10-CM | POA: Diagnosis not present

## 2022-04-20 DIAGNOSIS — K219 Gastro-esophageal reflux disease without esophagitis: Secondary | ICD-10-CM | POA: Diagnosis present

## 2022-04-20 DIAGNOSIS — E039 Hypothyroidism, unspecified: Secondary | ICD-10-CM | POA: Diagnosis not present

## 2022-04-20 DIAGNOSIS — J121 Respiratory syncytial virus pneumonia: Secondary | ICD-10-CM | POA: Diagnosis present

## 2022-04-20 DIAGNOSIS — K589 Irritable bowel syndrome without diarrhea: Secondary | ICD-10-CM | POA: Diagnosis not present

## 2022-04-20 DIAGNOSIS — E1165 Type 2 diabetes mellitus with hyperglycemia: Secondary | ICD-10-CM | POA: Diagnosis not present

## 2022-04-20 DIAGNOSIS — B338 Other specified viral diseases: Secondary | ICD-10-CM

## 2022-04-20 DIAGNOSIS — F419 Anxiety disorder, unspecified: Secondary | ICD-10-CM | POA: Diagnosis not present

## 2022-04-20 DIAGNOSIS — R0602 Shortness of breath: Secondary | ICD-10-CM | POA: Diagnosis not present

## 2022-04-20 DIAGNOSIS — E785 Hyperlipidemia, unspecified: Secondary | ICD-10-CM | POA: Diagnosis present

## 2022-04-20 DIAGNOSIS — Z79890 Hormone replacement therapy (postmenopausal): Secondary | ICD-10-CM

## 2022-04-20 DIAGNOSIS — Z83438 Family history of other disorder of lipoprotein metabolism and other lipidemia: Secondary | ICD-10-CM

## 2022-04-20 DIAGNOSIS — E119 Type 2 diabetes mellitus without complications: Secondary | ICD-10-CM

## 2022-04-20 DIAGNOSIS — I509 Heart failure, unspecified: Secondary | ICD-10-CM

## 2022-04-20 DIAGNOSIS — Z6841 Body Mass Index (BMI) 40.0 and over, adult: Secondary | ICD-10-CM | POA: Diagnosis not present

## 2022-04-20 DIAGNOSIS — I4811 Longstanding persistent atrial fibrillation: Secondary | ICD-10-CM | POA: Diagnosis not present

## 2022-04-20 DIAGNOSIS — I4891 Unspecified atrial fibrillation: Secondary | ICD-10-CM | POA: Diagnosis present

## 2022-04-20 DIAGNOSIS — Z79899 Other long term (current) drug therapy: Secondary | ICD-10-CM

## 2022-04-20 DIAGNOSIS — J9601 Acute respiratory failure with hypoxia: Secondary | ICD-10-CM | POA: Diagnosis not present

## 2022-04-20 DIAGNOSIS — Z7951 Long term (current) use of inhaled steroids: Secondary | ICD-10-CM

## 2022-04-20 DIAGNOSIS — Z823 Family history of stroke: Secondary | ICD-10-CM

## 2022-04-20 DIAGNOSIS — Z87891 Personal history of nicotine dependence: Secondary | ICD-10-CM | POA: Diagnosis not present

## 2022-04-20 DIAGNOSIS — Z90710 Acquired absence of both cervix and uterus: Secondary | ICD-10-CM

## 2022-04-20 LAB — CBC WITH DIFFERENTIAL/PLATELET
Abs Immature Granulocytes: 0.02 10*3/uL (ref 0.00–0.07)
Basophils Absolute: 0.1 10*3/uL (ref 0.0–0.1)
Basophils Relative: 1 %
Eosinophils Absolute: 0.1 10*3/uL (ref 0.0–0.5)
Eosinophils Relative: 1 %
HCT: 35.4 % — ABNORMAL LOW (ref 36.0–46.0)
Hemoglobin: 11 g/dL — ABNORMAL LOW (ref 12.0–15.0)
Immature Granulocytes: 0 %
Lymphocytes Relative: 13 %
Lymphs Abs: 1.1 10*3/uL (ref 0.7–4.0)
MCH: 27.8 pg (ref 26.0–34.0)
MCHC: 31.1 g/dL (ref 30.0–36.0)
MCV: 89.6 fL (ref 80.0–100.0)
Monocytes Absolute: 0.7 10*3/uL (ref 0.1–1.0)
Monocytes Relative: 8 %
Neutro Abs: 6.7 10*3/uL (ref 1.7–7.7)
Neutrophils Relative %: 77 %
Platelets: 258 10*3/uL (ref 150–400)
RBC: 3.95 MIL/uL (ref 3.87–5.11)
RDW: 15.5 % (ref 11.5–15.5)
WBC: 8.8 10*3/uL (ref 4.0–10.5)
nRBC: 0 % (ref 0.0–0.2)

## 2022-04-20 LAB — PROTIME-INR
INR: 1.2 (ref 0.8–1.2)
Prothrombin Time: 15.4 seconds — ABNORMAL HIGH (ref 11.4–15.2)

## 2022-04-20 MED ORDER — IPRATROPIUM-ALBUTEROL 0.5-2.5 (3) MG/3ML IN SOLN
3.0000 mL | Freq: Once | RESPIRATORY_TRACT | Status: AC
  Administered 2022-04-20: 3 mL via RESPIRATORY_TRACT
  Filled 2022-04-20: qty 3

## 2022-04-20 MED ORDER — IPRATROPIUM-ALBUTEROL 0.5-2.5 (3) MG/3ML IN SOLN
3.0000 mL | Freq: Once | RESPIRATORY_TRACT | Status: AC
  Filled 2022-04-20: qty 3

## 2022-04-20 MED ORDER — METHYLPREDNISOLONE SODIUM SUCC 125 MG IJ SOLR
125.0000 mg | Freq: Once | INTRAMUSCULAR | Status: AC
  Administered 2022-04-21: 125 mg via INTRAVENOUS
  Filled 2022-04-20: qty 2

## 2022-04-20 NOTE — ED Triage Notes (Signed)
Pt reports cough and SOB x3 days which continues to get worse despite OTC and prescribed medications.  Hx of COPD

## 2022-04-20 NOTE — ED Provider Triage Note (Signed)
Emergency Medicine Provider Triage Evaluation Note  Andrea Perry , a 72 y.o. female  was evaluated in triage.  Pt complains of cough and shortness of breath x 3 days with minimal improvement with home nebulizer treatments.  History of COPD.  Does not require any oxygen at home.  Chest tightness but no chest pain or palpitations that are new for the patient. A-fib anticoagulated on Eliquis.  Review of Systems  Positive: Sob, cough, chills Negative: Chest pain, fever  Physical Exam  BP (!) 141/75   Pulse (!) 119   Temp 98.6 F (37 C) (Oral)   Resp 16   SpO2 91%  Gen:   Awake, no distress  Resp:  Increased WOB, tachypneic, prolonged expiration, wheezing throughout lung fields bilaterally, decreased air movement in the bases MSK:   Moves extremities without difficulty  Other:  Irregularly irregular rhythm, normal rate at time of my exam.   Medical Decision Making  Medically screening exam initiated at 11:04 PM.  Appropriate orders placed.  Andrea Perry was informed that the remainder of the evaluation will be completed by another provider, this initial triage assessment does not replace that evaluation, and the importance of remaining in the ED until their evaluation is complete.  Duoneb initiated in triage.   This chart was dictated using voice recognition software, Dragon. Despite the best efforts of this provider to proofread and correct errors, errors may still occur which can change documentation meaning.    Andrea Darling, PA-C 04/20/22 2312

## 2022-04-21 DIAGNOSIS — J9600 Acute respiratory failure, unspecified whether with hypoxia or hypercapnia: Secondary | ICD-10-CM | POA: Diagnosis present

## 2022-04-21 DIAGNOSIS — J441 Chronic obstructive pulmonary disease with (acute) exacerbation: Secondary | ICD-10-CM | POA: Diagnosis not present

## 2022-04-21 DIAGNOSIS — J121 Respiratory syncytial virus pneumonia: Secondary | ICD-10-CM | POA: Diagnosis present

## 2022-04-21 DIAGNOSIS — Z6841 Body Mass Index (BMI) 40.0 and over, adult: Secondary | ICD-10-CM

## 2022-04-21 DIAGNOSIS — E785 Hyperlipidemia, unspecified: Secondary | ICD-10-CM

## 2022-04-21 DIAGNOSIS — B338 Other specified viral diseases: Secondary | ICD-10-CM

## 2022-04-21 DIAGNOSIS — I4811 Longstanding persistent atrial fibrillation: Secondary | ICD-10-CM | POA: Diagnosis not present

## 2022-04-21 DIAGNOSIS — J9601 Acute respiratory failure with hypoxia: Principal | ICD-10-CM | POA: Diagnosis present

## 2022-04-21 DIAGNOSIS — E039 Hypothyroidism, unspecified: Secondary | ICD-10-CM

## 2022-04-21 DIAGNOSIS — F32A Depression, unspecified: Secondary | ICD-10-CM

## 2022-04-21 DIAGNOSIS — F419 Anxiety disorder, unspecified: Secondary | ICD-10-CM

## 2022-04-21 LAB — COMPREHENSIVE METABOLIC PANEL
ALT: 13 U/L (ref 0–44)
AST: 24 U/L (ref 15–41)
Albumin: 3.1 g/dL — ABNORMAL LOW (ref 3.5–5.0)
Alkaline Phosphatase: 75 U/L (ref 38–126)
Anion gap: 13 (ref 5–15)
BUN: 11 mg/dL (ref 8–23)
CO2: 24 mmol/L (ref 22–32)
Calcium: 8.6 mg/dL — ABNORMAL LOW (ref 8.9–10.3)
Chloride: 102 mmol/L (ref 98–111)
Creatinine, Ser: 1 mg/dL (ref 0.44–1.00)
GFR, Estimated: >60 mL/min (ref >60)
Glucose, Bld: 151 mg/dL — ABNORMAL HIGH (ref 70–99)
Potassium: 4.4 mmol/L (ref 3.5–5.1)
Sodium: 139 mmol/L (ref 135–145)
Total Bilirubin: 0.6 mg/dL (ref 0.3–1.2)
Total Protein: 6.4 g/dL — ABNORMAL LOW (ref 6.5–8.1)

## 2022-04-21 LAB — CBC
HCT: 34.2 % — ABNORMAL LOW (ref 36.0–46.0)
Hemoglobin: 10.5 g/dL — ABNORMAL LOW (ref 12.0–15.0)
MCH: 27.3 pg (ref 26.0–34.0)
MCHC: 30.7 g/dL (ref 30.0–36.0)
MCV: 88.8 fL (ref 80.0–100.0)
Platelets: 259 10*3/uL (ref 150–400)
RBC: 3.85 MIL/uL — ABNORMAL LOW (ref 3.87–5.11)
RDW: 15.6 % — ABNORMAL HIGH (ref 11.5–15.5)
WBC: 9.8 10*3/uL (ref 4.0–10.5)
nRBC: 0 % (ref 0.0–0.2)

## 2022-04-21 LAB — CBG MONITORING, ED
Glucose-Capillary: 316 mg/dL — ABNORMAL HIGH (ref 70–99)
Glucose-Capillary: 186 mg/dL — ABNORMAL HIGH (ref 70–99)
Glucose-Capillary: 224 mg/dL — ABNORMAL HIGH (ref 70–99)

## 2022-04-21 LAB — CREATININE, SERUM
Creatinine, Ser: 0.94 mg/dL (ref 0.44–1.00)
GFR, Estimated: >60 mL/min (ref >60)

## 2022-04-21 LAB — GLUCOSE, CAPILLARY: Glucose-Capillary: 207 mg/dL — ABNORMAL HIGH (ref 70–99)

## 2022-04-21 LAB — RESP PANEL BY RT-PCR (RSV, FLU A&B, COVID)  RVPGX2
Influenza A by PCR: NEGATIVE
Influenza B by PCR: NEGATIVE
Resp Syncytial Virus by PCR: POSITIVE — AB
SARS Coronavirus 2 by RT PCR: NEGATIVE

## 2022-04-21 LAB — BRAIN NATRIURETIC PEPTIDE: B Natriuretic Peptide: 365.6 pg/mL — ABNORMAL HIGH (ref 0.0–100.0)

## 2022-04-21 LAB — HIV ANTIBODY (ROUTINE TESTING W REFLEX): HIV Screen 4th Generation wRfx: NONREACTIVE

## 2022-04-21 LAB — TROPONIN I (HIGH SENSITIVITY)
Troponin I (High Sensitivity): 4 ng/L (ref <18)
Troponin I (High Sensitivity): 4 ng/L (ref <18)

## 2022-04-21 MED ORDER — AMLODIPINE BESYLATE 10 MG PO TABS
10.0000 mg | ORAL_TABLET | Freq: Every day | ORAL | Status: DC
  Administered 2022-04-21 – 2022-04-25 (×5): 10 mg via ORAL
  Filled 2022-04-21 (×5): qty 1
  Filled 2022-04-21: qty 2

## 2022-04-21 MED ORDER — ALBUTEROL SULFATE (2.5 MG/3ML) 0.083% IN NEBU
2.5000 mg | INHALATION_SOLUTION | RESPIRATORY_TRACT | Status: DC | PRN
  Administered 2022-04-21 – 2022-04-23 (×7): 2.5 mg via RESPIRATORY_TRACT
  Filled 2022-04-21 (×8): qty 3

## 2022-04-21 MED ORDER — INSULIN ASPART 100 UNIT/ML IJ SOLN
0.0000 [IU] | Freq: Three times a day (TID) | INTRAMUSCULAR | Status: DC
  Administered 2022-04-21: 6 [IU] via SUBCUTANEOUS
  Administered 2022-04-21: 11 [IU] via SUBCUTANEOUS
  Administered 2022-04-21 – 2022-04-22 (×2): 3 [IU] via SUBCUTANEOUS
  Administered 2022-04-22 (×2): 5 [IU] via SUBCUTANEOUS
  Administered 2022-04-23: 3 [IU] via SUBCUTANEOUS
  Administered 2022-04-23: 8 [IU] via SUBCUTANEOUS
  Administered 2022-04-23 – 2022-04-24 (×2): 5 [IU] via SUBCUTANEOUS
  Administered 2022-04-24: 3 [IU] via SUBCUTANEOUS
  Administered 2022-04-24: 5 [IU] via SUBCUTANEOUS
  Administered 2022-04-25: 8 [IU] via SUBCUTANEOUS
  Administered 2022-04-25: 5 [IU] via SUBCUTANEOUS
  Administered 2022-04-25 – 2022-04-26 (×3): 3 [IU] via SUBCUTANEOUS

## 2022-04-21 MED ORDER — BENZONATATE 100 MG PO CAPS
100.0000 mg | ORAL_CAPSULE | Freq: Once | ORAL | Status: AC
  Administered 2022-04-21: 100 mg via ORAL
  Filled 2022-04-21: qty 1

## 2022-04-21 MED ORDER — APIXABAN 5 MG PO TABS
5.0000 mg | ORAL_TABLET | Freq: Two times a day (BID) | ORAL | Status: DC
  Administered 2022-04-21 – 2022-04-26 (×12): 5 mg via ORAL
  Filled 2022-04-21 (×12): qty 1

## 2022-04-21 MED ORDER — LORAZEPAM 1 MG PO TABS
1.0000 mg | ORAL_TABLET | Freq: Two times a day (BID) | ORAL | Status: DC | PRN
  Administered 2022-04-21 – 2022-04-22 (×2): 1 mg via ORAL
  Filled 2022-04-21 (×2): qty 1

## 2022-04-21 MED ORDER — PREDNISONE 20 MG PO TABS: 40.0000 mg | ORAL_TABLET | Freq: Every day | ORAL | Status: DC

## 2022-04-21 MED ORDER — ROSUVASTATIN CALCIUM 20 MG PO TABS
40.0000 mg | ORAL_TABLET | Freq: Every day | ORAL | Status: DC
  Administered 2022-04-21 – 2022-04-26 (×6): 40 mg via ORAL
  Filled 2022-04-21 (×6): qty 2

## 2022-04-21 MED ORDER — GUAIFENESIN 100 MG/5ML PO LIQD
10.0000 mL | ORAL | Status: DC | PRN
  Administered 2022-04-22 – 2022-04-25 (×2): 10 mL via ORAL
  Filled 2022-04-21 (×2): qty 15

## 2022-04-21 MED ORDER — PRAVASTATIN SODIUM 40 MG PO TABS: 40.0000 mg | ORAL_TABLET | Freq: Every day | ORAL | Status: DC

## 2022-04-21 MED ORDER — IPRATROPIUM BROMIDE 0.02 % IN SOLN
RESPIRATORY_TRACT | Status: AC
  Filled 2022-04-21: qty 2.5

## 2022-04-21 MED ORDER — AZITHROMYCIN 500 MG PO TABS
500.0000 mg | ORAL_TABLET | Freq: Every day | ORAL | Status: AC
  Administered 2022-04-22 – 2022-04-25 (×4): 500 mg via ORAL
  Filled 2022-04-21 (×4): qty 1

## 2022-04-21 MED ORDER — PREDNISONE 20 MG PO TABS
40.0000 mg | ORAL_TABLET | Freq: Every day | ORAL | Status: DC
  Administered 2022-04-22 – 2022-04-23 (×2): 40 mg via ORAL
  Filled 2022-04-21 (×2): qty 2

## 2022-04-21 MED ORDER — METHYLPREDNISOLONE SODIUM SUCC 40 MG IJ SOLR
40.0000 mg | Freq: Two times a day (BID) | INTRAMUSCULAR | Status: AC
  Administered 2022-04-21: 40 mg via INTRAVENOUS
  Filled 2022-04-21: qty 1

## 2022-04-21 MED ORDER — SODIUM CHLORIDE 0.9 % IV SOLN
500.0000 mg | INTRAVENOUS | Status: AC
  Administered 2022-04-21: 500 mg via INTRAVENOUS
  Filled 2022-04-21: qty 5

## 2022-04-21 MED ORDER — BENZONATATE 100 MG PO CAPS
100.0000 mg | ORAL_CAPSULE | Freq: Three times a day (TID) | ORAL | Status: DC
  Administered 2022-04-21 – 2022-04-26 (×16): 100 mg via ORAL
  Filled 2022-04-21 (×16): qty 1

## 2022-04-21 MED ORDER — IPRATROPIUM-ALBUTEROL 0.5-2.5 (3) MG/3ML IN SOLN
RESPIRATORY_TRACT | Status: AC
  Administered 2022-04-21: 3 mL via RESPIRATORY_TRACT
  Filled 2022-04-21: qty 3

## 2022-04-21 MED ORDER — FESOTERODINE FUMARATE ER 4 MG PO TB24
4.0000 mg | ORAL_TABLET | Freq: Every day | ORAL | Status: DC
  Administered 2022-04-22 – 2022-04-26 (×5): 4 mg via ORAL
  Filled 2022-04-21 (×6): qty 1

## 2022-04-21 MED ORDER — FLECAINIDE ACETATE 50 MG PO TABS
50.0000 mg | ORAL_TABLET | Freq: Two times a day (BID) | ORAL | Status: DC
  Administered 2022-04-21 – 2022-04-22 (×3): 50 mg via ORAL
  Filled 2022-04-21 (×4): qty 1

## 2022-04-21 MED ORDER — INSULIN ASPART 100 UNIT/ML IJ SOLN
0.0000 [IU] | Freq: Every day | INTRAMUSCULAR | Status: DC
  Administered 2022-04-21: 2 [IU] via SUBCUTANEOUS
  Administered 2022-04-22: 3 [IU] via SUBCUTANEOUS
  Administered 2022-04-23 – 2022-04-25 (×3): 2 [IU] via SUBCUTANEOUS

## 2022-04-21 MED ORDER — METHYLPREDNISOLONE SODIUM SUCC 40 MG IJ SOLR: 40.0000 mg | Freq: Two times a day (BID) | INTRAMUSCULAR | Status: DC

## 2022-04-21 MED ORDER — IRBESARTAN 75 MG PO TABS
150.0000 mg | ORAL_TABLET | Freq: Every day | ORAL | Status: DC
  Administered 2022-04-21 – 2022-04-26 (×6): 150 mg via ORAL
  Filled 2022-04-21: qty 1
  Filled 2022-04-21 (×5): qty 2

## 2022-04-21 MED ORDER — PANTOPRAZOLE SODIUM 40 MG PO TBEC
40.0000 mg | DELAYED_RELEASE_TABLET | Freq: Every day | ORAL | Status: DC
  Administered 2022-04-21 – 2022-04-26 (×6): 40 mg via ORAL
  Filled 2022-04-21 (×6): qty 1

## 2022-04-21 MED ORDER — FUROSEMIDE 10 MG/ML IJ SOLN
40.0000 mg | Freq: Two times a day (BID) | INTRAMUSCULAR | Status: AC
  Administered 2022-04-21 – 2022-04-22 (×4): 40 mg via INTRAVENOUS
  Filled 2022-04-21 (×4): qty 4

## 2022-04-21 MED ORDER — IPRATROPIUM BROMIDE 0.02 % IN SOLN
0.5000 mg | Freq: Four times a day (QID) | RESPIRATORY_TRACT | Status: AC
  Administered 2022-04-21 (×4): 0.5 mg via RESPIRATORY_TRACT
  Filled 2022-04-21 (×4): qty 2.5

## 2022-04-21 MED ORDER — HEPARIN SODIUM (PORCINE) 5000 UNIT/ML IJ SOLN: 5000.0000 [IU] | Freq: Three times a day (TID) | INTRAMUSCULAR | Status: DC

## 2022-04-21 MED ORDER — ESCITALOPRAM OXALATE 20 MG PO TABS
20.0000 mg | ORAL_TABLET | Freq: Every day | ORAL | Status: DC
  Administered 2022-04-21 – 2022-04-26 (×6): 20 mg via ORAL
  Filled 2022-04-21: qty 2
  Filled 2022-04-21 (×5): qty 1

## 2022-04-21 MED ORDER — METOPROLOL TARTRATE 100 MG PO TABS
100.0000 mg | ORAL_TABLET | Freq: Two times a day (BID) | ORAL | Status: DC
  Administered 2022-04-21 – 2022-04-25 (×11): 100 mg via ORAL
  Filled 2022-04-21: qty 1
  Filled 2022-04-21: qty 2
  Filled 2022-04-21 (×6): qty 1
  Filled 2022-04-21 (×2): qty 4
  Filled 2022-04-21 (×2): qty 1

## 2022-04-21 MED ORDER — METOPROLOL TARTRATE 25 MG PO TABS: 50.0000 mg | ORAL_TABLET | Freq: Two times a day (BID) | ORAL | Status: DC

## 2022-04-21 MED ORDER — LEVOTHYROXINE SODIUM 50 MCG PO TABS
50.0000 ug | ORAL_TABLET | Freq: Every day | ORAL | Status: DC
  Administered 2022-04-21 – 2022-04-26 (×6): 50 ug via ORAL
  Filled 2022-04-21 (×3): qty 1
  Filled 2022-04-21: qty 2
  Filled 2022-04-21 (×2): qty 1

## 2022-04-21 NOTE — Progress Notes (Signed)
   Patient seen and examined at bedside, patient admitted after midnight, please see earlier detailed admission note by Quintella Baton, MD. Briefly, patient presented secondary to shortness of breath and was found to have acute hypoxia secondary to COPD exacerbation secondary to RSV infection. Supportive care, IV steroids, supplemental oxygen azithromycin started.  Subjective: Patient reports feeling better than on admission. Still having significant dyspnea with exertion but dyspnea is not as bad at rest.   BP 102/66   Pulse (!) 130   Temp 99 F (37.2 C) (Oral)   Resp 17   Ht '5\' 2"'$  (1.575 m)   Wt 121.6 kg   SpO2 93%   BMI 49.03 kg/m   General exam: Appears calm. Just ambulated from bedside commode to stretcher Respiratory system: Diminished but no wheezing heard. No rhonchi. Mild rales. Tachypnea Cardiovascular system: S1 & S2 heard, irregular rhythm with increased rate. Gastrointestinal system: Abdomen is nondistended, soft and nontender. No organomegaly or masses felt. Normal bowel sounds heard. Central nervous system: Alert and oriented. No focal neurological deficits. Musculoskeletal: BLE pitting edema. No calf tenderness Skin: No cyanosis. No rashes Psychiatry: Judgement and insight appear normal. Mood & affect appropriate.   Brief assessment/Plan:  Acute respiratory failure with hypoxia Secondary to RSV/COPD exacerbation.  -Wean to room air -Will start ambulatory oxygen tests from tomorrow -Continue PT/OT  RSV infection No associated pneumonia at this time. -Supportive care  COPD exacerbation Secondary to infection. Patient with extremely poor functional capacity. -Continue Solu-medrol > prednisone, azithromycin -PT/OT  Atrial fibrillation Patient with RVR during mobility. Likely worsened secondary to acute illness and reduced functional capacity -Continue metoprolol and Eliquis  Peripheral edema No associated pulmonary edema. No history of heart failure although  last Transthoracic Echocardiogram was significant for minimally reduced LVEF of 50-55%  Hyperlipidemia Hypothyroidism Primary hypertension Depression OSA GERD -Per H&P  Family communication: None at bedside DVT prophylaxis: Eliquis Disposition: Discharge home likely in 2-3 days pending ability to wean oxygen if able and pending improvement of functional capacity  Cordelia Poche, MD [facility] 04/21/2022, 2:13 PM

## 2022-04-21 NOTE — H&P (Addendum)
PCP:   Biagio Borg, MD   Chief Complaint:  Shortness of breath  HPI: This is a 72 year old female with history of anxiety and depression, COPD, hypertension, dyslipidemia, diabetes mellitus, history of melanoma, obstructive sleep apnea (not on CPAP), extreme morbid obesity, atrial fibrillation.  Per patient she has chronic shortness of breath secondary to COPD and her morbid obesity.  Over the last 2 to 3 days, her shortness has gotten progressively worse.  She has been taking over-the-counter medication but it has not helped.  Tonight she and her family went to [place] [employer] for dinner, after which she asked them to take her to the ER.  She has nebulizers at home which she was using but they did not help, her shortness of breath always rebounded.  Per patient slight movement just simply takes her breath away.  She has a dry cough that per patient is improving.  She denies fevers or chills.  She states her ankles have been swelling up to her knee over the past 2 weeks.  She takes Lasix daily at baseline and elevates her legs but it has not helped.  In the ER patient is wheezing.  Attempt was made at ambulation, patient's oxygen dropped to the low 80s on room air.  History provided by the patient  Review of Systems:  The patient denies anorexia, fever, weight loss,, vision loss, decreased hearing, hoarseness, chest pain, syncope, dyspnea on exertion, peripheral edema, balance deficits, hemoptysis, abdominal pain, melena, hematochezia, severe indigestion/heartburn, hematuria, incontinence, genital sores, muscle weakness, suspicious skin lesions, transient blindness, difficulty walking, depression, unusual weight change, abnormal bleeding, enlarged lymph nodes, angioedema, and breast masses. Positives: Shortness of breath, wheeze, cough  Past Medical History: Past Medical History:  Diagnosis Date   ALLERGIC RHINITIS 08/16/2008   ANXIETY 08/16/2008   Arthritis    hands, knees, lower back   ASTHMA  08/16/2008   Asthma    BRONCHITIS     DR. 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   GERD (gastroesophageal reflux disease)    H/O hiatal hernia    HEMATOCHEZIA 06/20/2009   Hemorrhoids    History of cardioversion 10/30/14   History of kidney stones    HYPERLIPIDEMIA 08/16/2008   HYPERTENSION 12/03/2006   Hypertension    Impaired glucose tolerance 09/21/2010   Overactive bladder 10/20/2015   Pneumonia    as a child   Pulmonary HTN ([place]) 06/19/2015   Shortness of breath dyspnea    Sleep apnea    MILD NO CPAP ORDERED   Snoring 06/19/2015   Past Surgical History:  Procedure Laterality Date   ABDOMINAL HYSTERECTOMY     APPENDECTOMY     CARDIOVERSION N/A 03/20/2014   Procedure: CARDIOVERSION;  Surgeon: Josue Hector, MD;  Location: [facility];  Service: Cardiovascular;  Laterality: N/A;   CARDIOVERSION N/A 10/30/2014   Procedure: CARDIOVERSION;  Surgeon: Josue Hector, MD;  Location: [facility];  Service: Cardiovascular;  Laterality: N/A;   CARDIOVERSION N/A 04/03/2019   Procedure: CARDIOVERSION;  Surgeon: Dorothy Spark, MD;  Location: [facility];  Service: Cardiovascular;  Laterality: N/A;   CARPAL TUNNEL RELEASE  Right + LEFT   CESAREAN SECTION     x 1    COLONOSCOPY  2004   CYST EXCISION     RT ARM    CYSTOCELE REPAIR N/A 10/25/2012   Procedure: ANTERIOR REPAIR (CYSTOCELE);  Surgeon: Gus Height, MD;  Location: [facility];  Service: Gynecology;  Laterality: N/A;   HEEL SPUR SURGERY Bilateral    KNEE SURGERY     Right + LEFT   LUMBAR LAMINECTOMY/DECOMPRESSION MICRODISCECTOMY N/A 03/19/2016   Procedure: Laminectomy and Foraminotomy - Lumbar three-four - Lumbar four-five;  Surgeon: Eustace Moore, MD;  Location: [facility];  Service:  Neurosurgery;  Laterality: N/A;   RADIOLOGY WITH ANESTHESIA Right 11/01/2014   Procedure: MRI RIGHT FOREARM;  Surgeon: Medication Radiologist, MD;  Location: [facility];  Service: Radiology;  Laterality: Right;   RADIOLOGY WITH ANESTHESIA Right 08/29/2015   Procedure: MRI - RIGHT FOREARM;  Surgeon: Medication Radiologist, MD;  Location: [place] [facility];  Service: Radiology;  Laterality: Right;   RADIOLOGY WITH ANESTHESIA N/A 12/05/2015   Procedure: MRI SPINE WITHOUT;  Surgeon: Medication Radiologist, MD;  Location: [facility];  Service: Radiology;  Laterality: N/A;   SKIN CANCER EXCISION     BILAT SHOULDERS   SPLIT NIGHT STUDY  09/04/2015   TONSILLECTOMY AND ADENOIDECTOMY      Medications: Prior to Admission medications   Medication Sig Start Date End Date Taking? Authorizing Provider  acetaminophen (TYLENOL) 500 MG tablet Take 500 mg by mouth every 6 (six) hours as needed for mild pain or moderate pain.    [provider]  albuterol (PROVENTIL) (2.5 MG/3ML) 0.083% nebulizer solution USE EVERY 4 HOURS WITH IPRATROPIUM 04/17/21   Collene Gobble, MD  albuterol (VENTOLIN HFA) 108 (90 Base) MCG/ACT inhaler Inhale 2 puffs by mouth every 6 hours as needed for wheezing or shortness of breath. 05/16/19   Collene Gobble, MD  amLODipine (NORVASC) 10 MG tablet Take 1 tablet (10 mg total) by mouth daily. 02/04/22   Biagio Borg, MD  apixaban Arne Cleveland) 5 MG TABS tablet Take 1 tablet by mouth twice daily 09/24/21   Josue Hector, MD  Blood Glucose Monitoring Suppl (FREESTYLE LITE) DEVI Use as directed daily E11.9 09/26/18   Biagio Borg, MD  Budeson-Glycopyrrol-Formoterol (BREZTRI AEROSPHERE) 160-9-4.8 MCG/ACT AERO Inhale 2 puffs into the lungs 2 (two) times daily. 08/26/21   Collene Gobble, MD  calcium-vitamin D (OSCAL 500/200 D-3) 500-200 MG-UNIT tablet Take 1 tablet by mouth 2 (two) times daily. 06/14/20 07/18/21  Pokhrel, Corrie Mckusick, MD  cetirizine (ZYRTEC) 10 MG tablet Take 10 mg by mouth daily.    [provider]  Cholecalciferol 50 MCG (2000 UT) TABS 1 tab by mouth once daily 02/05/22   Biagio Borg, MD  cyclobenzaprine (FLEXERIL) 5 MG tablet     [provider]  diphenoxylate-atropine (LOMOTIL) 2.5-0.025 MG tablet Take 1 tablet by mouth 4 (four) times daily as needed for diarrhea or loose stools. 03/23/18   Biagio Borg, MD  doxycycline (VIBRA-TABS) 100 MG tablet Take 1 tablet (100 mg total) by mouth 2 (two) times daily. 02/04/22   Biagio Borg, MD  escitalopram (LEXAPRO) 20 MG tablet Take 1 tablet (20 mg total) by mouth daily. 02/04/22   Biagio Borg, MD  estradiol (ESTRACE) 1 MG tablet Take 1 mg by mouth daily. 12/18/18   [provider]  flecainide (TAMBOCOR) 50 MG tablet TAKE 1 TABLET BY MOUTH TWICE DAILY PLEASE KEEP UPCOMING APPOINTMENT FOR FURTHER REFILLS [place]  YOU    [provider]  fluorouracil (EFUDEX) 5 % cream     [provider]  fluticasone (FLONASE) 50 MCG/ACT nasal spray     [provider]  furosemide (LASIX) 40 MG tablet Take 1 tablet (40 mg total) by mouth daily. 02/04/22   Biagio Borg, MD  gabapentin (NEURONTIN) 300 MG capsule     [provider]  glucose blood (FREESTYLE LITE) test strip Use as instructed once daily E11.9 09/26/18   Biagio Borg, MD  guaiFENesin (MUCINEX) 600 MG 12 hr tablet Take 600 mg by mouth 2 (two) times daily as needed for cough.    [provider]  ipratropium (ATROVENT) 0.02 % nebulizer solution INHALE 1 VIAL IN NEBULIZER 4 TIMES DAILY WITH  ALBUTEROL 08/07/21   Byrum, Rose Fillers, MD  ipratropium-albuterol (DUONEB) 0.5-2.5 (3) MG/3ML SOLN USE 1 AMPULE IN NEBULIZER 4 TIMES DAILY 12/23/21   Collene Gobble, MD  irbesartan (AVAPRO) 150 MG tablet Take 1 tablet (150 mg total) by mouth daily. 02/04/22   Biagio Borg, MD  ketoconazole (NIZORAL) 2 % cream     [provider]  Lancets MISC Use as directed once daily E11.9 09/26/18   Biagio Borg, MD  levothyroxine (SYNTHROID) 50 MCG tablet Take 1  tablet (50 mcg total) by mouth daily. 02/05/22   Biagio Borg, MD  LORazepam (ATIVAN) 1 MG tablet Take 1 tablet (1 mg total) by mouth 2 (two) times daily as needed. for anxiety 02/04/22   Biagio Borg, MD  Magnesium Oxide -Mg Supplement 500 MG CAPS 1 tab by mouth twice per day 02/05/22   Biagio Borg, MD  metFORMIN (GLUCOPHAGE-XR) 500 MG 24 hr tablet TAKE 2 TABLETS BY MOUTH ONCE DAILY WITH BREAKFAST 02/04/22   Biagio Borg, MD  metoprolol tartrate (LOPRESSOR) 100 MG tablet Take 1 tablet by mouth twice daily 07/07/21   Josue Hector, MD  metoprolol tartrate (LOPRESSOR) 50 MG tablet TAKE 1 TABLETS BY MOUTH TWICE DAILY    [provider]  naproxen (NAPROSYN) 500 MG tablet     [provider]  nystatin powder     [provider]  pantoprazole (PROTONIX) 40 MG tablet Take 1 tablet by mouth daily.    [provider]  penicillin v potassium (VEETID) 500 MG tablet     [provider]  potassium chloride SA (KLOR-CON) 20 MEQ tablet Take 1 tablet (20 mEq total) by mouth daily. 06/14/20   Pokhrel, Corrie Mckusick, MD  pravastatin (PRAVACHOL) 40 MG tablet Take 1 tablet by mouth daily.    [provider]  predniSONE (DELTASONE) 10 MG tablet 3 tabs by mouth per day for 3 days,2tabs per day for 3 days,1tab per day for 3 days 02/04/22   Biagio Borg, MD  promethazine-dextromethorphan (PROMETHAZINE-DM) 6.25-15 MG/5ML syrup     [provider]  rivaroxaban (XARELTO) 20 MG TABS tablet     [provider]  rosuvastatin (CRESTOR) 40 MG tablet Take 1 tablet (40 mg total) by mouth daily. 02/04/22   Biagio Borg, MD  Semaglutide (RYBELSUS) 3 MG TABS Take 3 mg by mouth daily. 02/04/22   Biagio Borg, MD  Semaglutide (RYBELSUS) 7 MG TABS Take 7 mg by mouth daily. 02/04/22   Biagio Borg, MD  tiZANidine (ZANAFLEX) 4 MG tablet Take 1 tablet (4 mg total) by mouth every 6 (six) hours as needed for muscle spasms. 07/18/21   Biagio Borg, MD  tolterodine  (DETROL LA) 4 MG 24 hr capsule Take 1 capsule (4 mg total) by mouth daily. 02/04/22   Biagio Borg, MD    Allergies:   Allergies  Allergen Reactions   Tramadol Hcl Nausea And Vomiting and Other (See Comments)     mouth dryness, headache   Other Nausea Only   Codeine Nausea And Vomiting and Nausea Only   Tape Rash    Plastic tape, bandaids and ekg leads, causes redness and rash Other reaction(s): Not available   Vicodin [Hydrocodone-Acetaminophen] Nausea And Vomiting    Social History:  reports that she quit smoking about 19 years ago. Her smoking use included cigarettes. She has a 45.00 pack-year smoking history. She has never used smokeless tobacco. She reports current alcohol use of about 2.0 standard drinks of alcohol per week. She reports that she does not use drugs.  Family History: Family History  Problem Relation Age of Onset   Diabetes Mother    Hyperlipidemia Mother    Hypertension Mother    Colon polyps Mother    Stroke Father    Colon cancer Neg Hx     Physical Exam: Vitals:   04/20/22 2305 04/21/22 0057 04/21/22 0202 04/21/22 0306  BP:  (!) 125/103 139/60 (!) 130/96  Pulse:  85 86 98  Resp:  (!) '22 13 14  '$ Temp:  99.7 F (37.6 C)    TempSrc:  Oral    SpO2:  95% 100% 100%  Weight: 121.6 kg     Height: '5\' 2"'$  (1.575 m)       General:  Alert and oriented times three, extreme morbid obesity, no acute distress Eyes: PERRLA, pink conjunctiva, no scleral icterus ENT: Moist oral mucosa, neck supple, no thyromegaly Lungs: Generalized wheezing, no crackles, no use of accessory muscles Cardiovascular: regular rate and rhythm, no regurgitation, no gallops, no murmurs. No carotid bruits, no JVD Abdomen: soft, positive BS, non-tender, non-distended, no organomegaly, not an acute abdomen GU: not examined Neuro: CN II - XII grossly intact, sensation intact Musculoskeletal: strength 5/5 all extremities, no clubbing, cyanosis.  Lower extremity edema Skin: no rash, no  subcutaneous crepitation, no decubitus Psych: appropriate patient  Labs on Admission:  Recent Labs    04/20/22 2314  NA 139  K 4.4  CL 102  CO2 24  GLUCOSE 151*  BUN 11  CREATININE 1.00  CALCIUM 8.6*   Recent Labs    04/20/22 2314  AST 24  ALT 13  ALKPHOS 75  BILITOT 0.6  PROT 6.4*  ALBUMIN 3.1*    Recent Labs    04/20/22 2314  WBC 8.8  NEUTROABS 6.7  HGB 11.0*  HCT 35.4*  MCV 89.6  PLT 258     Micro Results: Recent Results (from the past 240 hour(s))  Resp panel by RT-PCR (RSV, Flu A&B, Covid) Anterior Nasal Swab     Status: Abnormal   Collection Time: 04/20/22 11:09 PM   Specimen: Anterior Nasal Swab  Result Value Ref Range Status   SARS Coronavirus 2 by RT PCR NEGATIVE NEGATIVE Final    Comment: (NOTE) SARS-CoV-2 target nucleic acids are NOT DETECTED.  The SARS-CoV-2 RNA is generally detectable in upper respiratory specimens during the acute phase of infection. The lowest concentration of SARS-CoV-2 viral copies this assay can detect is 138 copies/mL. A negative result does not preclude SARS-Cov-2 infection and should not be used as the sole basis for treatment or other patient management decisions. A negative result may occur with  improper  specimen collection/handling, submission of specimen other than nasopharyngeal swab, presence of viral mutation(s) within the areas targeted by this assay, and inadequate number of viral copies(<138 copies/mL). A negative result must be combined with clinical observations, patient history, and epidemiological information. The expected result is Negative.  Fact Sheet for Patients:  EntrepreneurPulse.com.au  Fact Sheet for Healthcare Providers:  IncredibleEmployment.be  This test is no t yet approved or cleared by the [place] FDA and  has been authorized for detection and/or diagnosis of SARS-CoV-2 by FDA under an Emergency Use Authorization (EUA). This EUA will remain   in effect (meaning this test can be used) for the duration of the COVID-19 declaration under Section 564(b)(1) of the Act, 21 U.S.C.section 360bbb-3(b)(1), unless the authorization is terminated  or revoked sooner.       Influenza A by PCR NEGATIVE NEGATIVE Final   Influenza B by PCR NEGATIVE NEGATIVE Final    Comment: (NOTE) The Xpert Xpress SARS-CoV-2/FLU/RSV plus assay is intended as an aid in the diagnosis of influenza from Nasopharyngeal swab specimens and should not be used as a sole basis for treatment. Nasal washings and aspirates are unacceptable for Xpert Xpress SARS-CoV-2/FLU/RSV testing.  Fact Sheet for Patients: EntrepreneurPulse.com.au  Fact Sheet for Healthcare Providers: IncredibleEmployment.be  This test is not yet approved or cleared by the [place] FDA and has been authorized for detection and/or diagnosis of SARS-CoV-2 by FDA under an Emergency Use Authorization (EUA). This EUA will remain in effect (meaning this test can be used) for the duration of the COVID-19 declaration under Section 564(b)(1) of the Act, 21 U.S.C. section 360bbb-3(b)(1), unless the authorization is terminated or revoked.     Resp Syncytial Virus by PCR POSITIVE (A) NEGATIVE Final    Comment: (NOTE) Fact Sheet for Patients: EntrepreneurPulse.com.au  Fact Sheet for Healthcare Providers: IncredibleEmployment.be  This test is not yet approved or cleared by the [place] FDA and has been authorized for detection and/or diagnosis of SARS-CoV-2 by FDA under an Emergency Use Authorization (EUA). This EUA will remain in effect (meaning this test can be used) for the duration of the COVID-19 declaration under Section 564(b)(1) of the Act, 21 U.S.C. section 360bbb-3(b)(1), unless the authorization is terminated or revoked.  Performed at [facility], [place] [address], [place], [place] [address]       Radiological Exams on Admission: DG Chest Port 1 View  Result Date: 04/20/2022 CLINICAL DATA:  sob, cough EXAM: PORTABLE CHEST 1 VIEW COMPARISON:  Chest x-ray 06/13/2020 FINDINGS: The heart and mediastinal contours are unchanged. Aortic calcification. No focal consolidation. No pulmonary edema. No pleural effusion. No pneumothorax. No acute osseous abnormality. IMPRESSION: 1. No active disease. 2.  Aortic Atherosclerosis (ICD10-I70.0). Electronically Signed   By: Iven Finn M.D.   On: 04/20/2022 23:32    Assessment/Plan Present on Admission:  Acute respiratory failure with hypoxemia (HCC)  COPD with acute exacerbation (HCC)/ RSV (respiratory syncytial virus pneumonia) -Admit to MedSurg -COPD order set initiated -IV Solu-Medrol, oxygen to keep sats greater than 88% -antitussive and nebs scheduled and PRN -IV azithromycin to cover COPD  Fluid overload-lower extremity edema likely secondary to body habitus -IV lasix.  Hold p.o. Lasix   Hyperlipidemia -stable, pravachol resumed   Hypothyroidism -Stable, Synthroid resumed   Essential hypertension -Stable, Norvasc, and Avapro resumed   Depression -Stable, Lexapro resumed   A-fib (HCC) -Stable, flecainide and Eliquis resumed   Mild obstructive sleep apnea -does  not use CPaP   GERD (gastroesophageal reflux disease) -  Chantelle Verdi 04/21/2022, 3:31 AM

## 2022-04-21 NOTE — Plan of Care (Signed)
Problem: Education: Goal: Ability to describe self-care measures that may prevent or decrease complications (Diabetes Survival Skills Education) will improve 04/21/2022 2255 by Jule Ser, RN Outcome: Progressing 04/21/2022 2255 by Jule Ser, RN Outcome: Progressing Goal: Individualized Educational Video(s) 04/21/2022 2255 by Jule Ser, RN Outcome: Progressing 04/21/2022 2255 by Jule Ser, RN Outcome: Progressing   Problem: Coping: Goal: Ability to adjust to condition or change in health will improve 04/21/2022 2255 by Jule Ser, RN Outcome: Progressing 04/21/2022 2255 by Jule Ser, RN Outcome: Progressing   Problem: Fluid Volume: Goal: Ability to maintain a balanced intake and output will improve 04/21/2022 2255 by Jule Ser, RN Outcome: Progressing 04/21/2022 2255 by Jule Ser, RN Outcome: Progressing   Problem: Health Behavior/Discharge Planning: Goal: Ability to identify and utilize available resources and services will improve 04/21/2022 2255 by Jule Ser, RN Outcome: Progressing 04/21/2022 2255 by Jule Ser, RN Outcome: Progressing Goal: Ability to manage health-related needs will improve 04/21/2022 2255 by Jule Ser, RN Outcome: Progressing 04/21/2022 2255 by Jule Ser, RN Outcome: Progressing   Problem: Metabolic: Goal: Ability to maintain appropriate glucose levels will improve 04/21/2022 2255 by Jule Ser, RN Outcome: Progressing 04/21/2022 2255 by Jule Ser, RN Outcome: Progressing   Problem: Nutritional: Goal: Maintenance of adequate nutrition will improve 04/21/2022 2255 by Jule Ser, RN Outcome: Progressing 04/21/2022 2255 by Jule Ser, RN Outcome: Progressing Goal: Progress toward achieving an optimal weight will improve 04/21/2022 2255 by Jule Ser, RN Outcome: Progressing 04/21/2022 2255 by Jule Ser, RN Outcome: Progressing   Problem: Skin  Integrity: Goal: Risk for impaired skin integrity will decrease 04/21/2022 2255 by Jule Ser, RN Outcome: Progressing 04/21/2022 2255 by Jule Ser, RN Outcome: Progressing   Problem: Tissue Perfusion: Goal: Adequacy of tissue perfusion will improve 04/21/2022 2255 by Jule Ser, RN Outcome: Progressing 04/21/2022 2255 by Jule Ser, RN Outcome: Progressing   Problem: Education: Goal: Knowledge of General Education information will improve Description: Including pain rating scale, medication(s)/side effects and non-pharmacologic comfort measures 04/21/2022 2255 by Jule Ser, RN Outcome: Progressing 04/21/2022 2255 by Jule Ser, RN Outcome: Progressing   Problem: Health Behavior/Discharge Planning: Goal: Ability to manage health-related needs will improve 04/21/2022 2255 by Jule Ser, RN Outcome: Progressing 04/21/2022 2255 by Jule Ser, RN Outcome: Progressing   Problem: Clinical Measurements: Goal: Ability to maintain clinical measurements within normal limits will improve 04/21/2022 2255 by Jule Ser, RN Outcome: Progressing 04/21/2022 2255 by Jule Ser, RN Outcome: Progressing Goal: Will remain free from infection 04/21/2022 2255 by Jule Ser, RN Outcome: Progressing 04/21/2022 2255 by Jule Ser, RN Outcome: Progressing Goal: Diagnostic test results will improve 04/21/2022 2255 by Jule Ser, RN Outcome: Progressing 04/21/2022 2255 by Jule Ser, RN Outcome: Progressing Goal: Respiratory complications will improve 04/21/2022 2255 by Jule Ser, RN Outcome: Progressing 04/21/2022 2255 by Jule Ser, RN Outcome: Progressing Goal: Cardiovascular complication will be avoided 04/21/2022 2255 by Jule Ser, RN Outcome: Progressing 04/21/2022 2255 by Jule Ser, RN Outcome: Progressing   Problem: Activity: Goal: Risk for activity intolerance will decrease 04/21/2022 2255 by Jule Ser, RN Outcome: Progressing 04/21/2022 2255 by Jule Ser, RN Outcome: Progressing   Problem: Nutrition: Goal: Adequate nutrition will be maintained 04/21/2022 2255 by Jule Ser, RN Outcome: Progressing 04/21/2022 2255 by Jule Ser, RN Outcome: Progressing  Problem: Coping: Goal: Level of anxiety will decrease 04/21/2022 2255 by Jule Ser, RN Outcome: Progressing 04/21/2022 2255 by Jule Ser, RN Outcome: Progressing   Problem: Elimination: Goal: Will not experience complications related to bowel motility 04/21/2022 2255 by Jule Ser, RN Outcome: Progressing 04/21/2022 2255 by Jule Ser, RN Outcome: Progressing Goal: Will not experience complications related to urinary retention 04/21/2022 2255 by Jule Ser, RN Outcome: Progressing 04/21/2022 2255 by Jule Ser, RN Outcome: Progressing   Problem: Pain Managment: Goal: General experience of comfort will improve 04/21/2022 2255 by Jule Ser, RN Outcome: Progressing 04/21/2022 2255 by Jule Ser, RN Outcome: Progressing   Problem: Safety: Goal: Ability to remain free from injury will improve 04/21/2022 2255 by Jule Ser, RN Outcome: Progressing 04/21/2022 2255 by Jule Ser, RN Outcome: Progressing   Problem: Skin Integrity: Goal: Risk for impaired skin integrity will decrease 04/21/2022 2255 by Jule Ser, RN Outcome: Progressing 04/21/2022 2255 by Jule Ser, RN Outcome: Progressing

## 2022-04-21 NOTE — ED Provider Notes (Signed)
[facility] EMERGENCY DEPARTMENT Provider Note   CSN: [id number] Arrival date & time: 04/20/22  2146     History  Chief Complaint  Patient presents with   Shortness of Breath   Cough    Andrea Perry is a 72 y.o. female.  72 y/o female with hx of HTN, persistent Afib (on Eliquis), HLD, pulmonary HTN, DM, and asthma/COPD presents to the ED for SOB x 3 days. Symptoms have been constant, worsening. She reports dyspnea on exertion with persistent, harsh cough. The patient has been using her home nebulizer treatments with minimal relief. Symptoms associated with diffuse chest tightness. No fevers, other chest pain, palpitations, syncope/near syncope.  The history is provided by the patient. No language interpreter was used.  Shortness of Breath Associated symptoms: cough   Cough Associated symptoms: shortness of breath        Home Medications Prior to Admission medications   Medication Sig Start Date End Date Taking? Authorizing Provider  acetaminophen (TYLENOL) 500 MG tablet Take 500 mg by mouth every 6 (six) hours as needed for mild pain or moderate pain.    [provider]  albuterol (PROVENTIL) (2.5 MG/3ML) 0.083% nebulizer solution USE EVERY 4 HOURS WITH IPRATROPIUM 04/17/21   Collene Gobble, MD  albuterol (VENTOLIN HFA) 108 (90 Base) MCG/ACT inhaler Inhale 2 puffs by mouth every 6 hours as needed for wheezing or shortness of breath. 05/16/19   Collene Gobble, MD  amLODipine (NORVASC) 10 MG tablet Take 1 tablet (10 mg total) by mouth daily. 02/04/22   Biagio Borg, MD  apixaban Arne [place]) 5 MG TABS tablet Take 1 tablet by mouth twice daily 09/24/21   Josue Hector, MD  Blood Glucose Monitoring Suppl (FREESTYLE LITE) DEVI Use as directed daily E11.9 09/26/18   Biagio Borg, MD  Budeson-Glycopyrrol-Formoterol (BREZTRI AEROSPHERE) 160-9-4.8 MCG/ACT AERO Inhale 2 puffs into the lungs 2 (two) times daily. 08/26/21   Collene Gobble, MD  calcium-vitamin D  (OSCAL 500/200 D-3) 500-200 MG-UNIT tablet Take 1 tablet by mouth 2 (two) times daily. 06/14/20 07/18/21  Pokhrel, Corrie Mckusick, MD  cetirizine (ZYRTEC) 10 MG tablet Take 10 mg by mouth daily.    [provider]  Cholecalciferol 50 MCG (2000 UT) TABS 1 tab by mouth once daily 02/05/22   Biagio Borg, MD  cyclobenzaprine (FLEXERIL) 5 MG tablet     [provider]  diphenoxylate-atropine (LOMOTIL) 2.5-0.025 MG tablet Take 1 tablet by mouth 4 (four) times daily as needed for diarrhea or loose stools. 03/23/18   Biagio Borg, MD  doxycycline (VIBRA-TABS) 100 MG tablet Take 1 tablet (100 mg total) by mouth 2 (two) times daily. 02/04/22   Biagio Borg, MD  escitalopram (LEXAPRO) 20 MG tablet Take 1 tablet (20 mg total) by mouth daily. 02/04/22   Biagio Borg, MD  estradiol (ESTRACE) 1 MG tablet Take 1 mg by mouth daily. 12/18/18   [provider]  flecainide (TAMBOCOR) 50 MG tablet TAKE 1 TABLET BY MOUTH TWICE DAILY PLEASE KEEP UPCOMING APPOINTMENT FOR FURTHER REFILLS [place] YOU    [provider]  fluorouracil (EFUDEX) 5 % cream     [provider]  fluticasone (FLONASE) 50 MCG/ACT nasal spray     [provider]  furosemide (LASIX) 40 MG tablet Take 1 tablet (40 mg total) by mouth daily. 02/04/22   Biagio Borg, MD  gabapentin (NEURONTIN) 300 MG capsule     [provider]  glucose  blood (FREESTYLE LITE) test strip Use as instructed once daily E11.9 09/26/18   Biagio Borg, MD  guaiFENesin (MUCINEX) 600 MG 12 hr tablet Take 600 mg by mouth 2 (two) times daily as needed for cough.    [provider]  ipratropium (ATROVENT) 0.02 % nebulizer solution INHALE 1 VIAL IN NEBULIZER 4 TIMES DAILY WITH  ALBUTEROL 08/07/21   Byrum, Rose Fillers, MD  ipratropium-albuterol (DUONEB) 0.5-2.5 (3) MG/3ML SOLN USE 1 AMPULE IN NEBULIZER 4 TIMES DAILY 12/23/21   Collene Gobble, MD  irbesartan (AVAPRO) 150 MG tablet Take 1 tablet (150 mg total) by mouth daily.  02/04/22   Biagio Borg, MD  ketoconazole (NIZORAL) 2 % cream     [provider]  Lancets MISC Use as directed once daily E11.9 09/26/18   Biagio Borg, MD  levothyroxine (SYNTHROID) 50 MCG tablet Take 1 tablet (50 mcg total) by mouth daily. 02/05/22   Biagio Borg, MD  LORazepam (ATIVAN) 1 MG tablet Take 1 tablet (1 mg total) by mouth 2 (two) times daily as needed. for anxiety 02/04/22   Biagio Borg, MD  Magnesium Oxide -Mg Supplement 500 MG CAPS 1 tab by mouth twice per day 02/05/22   Biagio Borg, MD  metFORMIN (GLUCOPHAGE-XR) 500 MG 24 hr tablet TAKE 2 TABLETS BY MOUTH ONCE DAILY WITH BREAKFAST 02/04/22   Biagio Borg, MD  metoprolol tartrate (LOPRESSOR) 100 MG tablet Take 1 tablet by mouth twice daily 07/07/21   Josue Hector, MD  metoprolol tartrate (LOPRESSOR) 50 MG tablet TAKE 1 TABLETS BY MOUTH TWICE DAILY    [provider]  naproxen (NAPROSYN) 500 MG tablet     [provider]  nystatin powder     [provider]  pantoprazole (PROTONIX) 40 MG tablet Take 1 tablet by mouth daily.    [provider]  penicillin v potassium (VEETID) 500 MG tablet     [provider]  potassium chloride SA (KLOR-CON) 20 MEQ tablet Take 1 tablet (20 mEq total) by mouth daily. 06/14/20   Pokhrel, Corrie Mckusick, MD  pravastatin (PRAVACHOL) 40 MG tablet Take 1 tablet by mouth daily.    [provider]  predniSONE (DELTASONE) 10 MG tablet 3 tabs by mouth per day for 3 days,2tabs per day for 3 days,1tab per day for 3 days 02/04/22   Biagio Borg, MD  promethazine-dextromethorphan (PROMETHAZINE-DM) 6.25-15 MG/5ML syrup     [provider]  rivaroxaban (XARELTO) 20 MG TABS tablet     [provider]  rosuvastatin (CRESTOR) 40 MG tablet Take 1 tablet (40 mg total) by mouth daily. 02/04/22   Biagio Borg, MD  Semaglutide (RYBELSUS) 3 MG TABS Take 3 mg by mouth daily. 02/04/22   Biagio Borg, MD  Semaglutide (RYBELSUS) 7 MG TABS Take  7 mg by mouth daily. 02/04/22   Biagio Borg, MD  tiZANidine (ZANAFLEX) 4 MG tablet Take 1 tablet (4 mg total) by mouth every 6 (six) hours as needed for muscle spasms. 07/18/21   Biagio Borg, MD  tolterodine (DETROL LA) 4 MG 24 hr capsule Take 1 capsule (4 mg total) by mouth daily. 02/04/22   Biagio Borg, MD      Allergies    Tramadol hcl, Other, Codeine, Tape, and Vicodin [hydrocodone-acetaminophen]    Review of Systems   Review of Systems  Respiratory:  Positive for cough and shortness of breath.   Ten systems reviewed and are negative  for acute change, except as noted in the HPI.    Physical Exam Updated Vital Signs BP (!) 130/96   Pulse 98   Temp 99.7 F (37.6 C) (Oral)   Resp 14   Ht '5\' 2"'$  (1.575 m)   Wt 121.6 kg   SpO2 100%   BMI 49.03 kg/m   Physical Exam Vitals and nursing note reviewed.  Constitutional:      General: She is not in acute distress.    Appearance: She is well-developed. She is not diaphoretic.     Comments: Obese female, nontoxic.  HENT:     Head: Normocephalic and atraumatic.  Eyes:     General: No scleral icterus.    Conjunctiva/sclera: Conjunctivae normal.  Cardiovascular:     Rate and Rhythm: Normal rate. Rhythm irregular.     Pulses: Normal pulses.  Pulmonary:     Effort: Pulmonary effort is normal. No respiratory distress.     Breath sounds: No stridor. Wheezing (expiratory wheeze throughout) present.     Comments: Tachypnea with dyspnea. No accessory muscle use. Speaking in short sentences. Musculoskeletal:        General: Normal range of motion.     Cervical back: Normal range of motion.     Right lower leg: Edema present.     Left lower leg: Edema present.     Comments: 2+ pitting edema BLE  Skin:    General: Skin is warm and dry.     Coloration: Skin is not pale.     Findings: No erythema or rash.  Neurological:     Mental Status: She is alert and oriented to person, place, and time.     Coordination: Coordination normal.   Psychiatric:        Behavior: Behavior normal.     ED Results / Procedures / Treatments   Labs (all labs ordered are listed, but only abnormal results are displayed) Labs Reviewed  RESP PANEL BY RT-PCR (RSV, FLU A&B, COVID)  RVPGX2 - Abnormal; Notable for the following components:      Result Value   Resp Syncytial Virus by PCR POSITIVE (*)    All other components within normal limits  CBC WITH DIFFERENTIAL/PLATELET - Abnormal; Notable for the following components:   Hemoglobin 11.0 (*)    HCT 35.4 (*)    All other components within normal limits  COMPREHENSIVE METABOLIC PANEL - Abnormal; Notable for the following components:   Glucose, Bld 151 (*)    Calcium 8.6 (*)    Total Protein 6.4 (*)    Albumin 3.1 (*)    All other components within normal limits  PROTIME-INR - Abnormal; Notable for the following components:   Prothrombin Time 15.4 (*)    All other components within normal limits  BRAIN NATRIURETIC PEPTIDE - Abnormal; Notable for the following components:   B Natriuretic Peptide 365.6 (*)    All other components within normal limits  TROPONIN I (HIGH SENSITIVITY)  TROPONIN I (HIGH SENSITIVITY)    EKG EKG Interpretation  Date/Time:  Monday April 20 2022 21:55:01 EST Ventricular Rate:  111 PR Interval:    QRS Duration: 64 QT Interval:  314 QTC Calculation: 427 R Axis:   66 Text Interpretation: Atrial fibrillation with rapid ventricular response with premature ventricular or aberrantly conducted complexes Low voltage QRS Septal infarct , age undetermined Abnormal ECG When compared with ECG of 13-Jun-2020 17:44, no significant change is noted Confirmed by Veryl Speak [phone]) on 04/21/2022 2:47:13 AM  Radiology DG Chest  Port 1 View  Result Date: 04/20/2022 CLINICAL DATA:  sob, cough EXAM: PORTABLE CHEST 1 VIEW COMPARISON:  Chest x-ray 06/13/2020 FINDINGS: The heart and mediastinal contours are unchanged. Aortic calcification. No focal consolidation. No pulmonary  edema. No pleural effusion. No pneumothorax. No acute osseous abnormality. IMPRESSION: 1. No active disease. 2.  Aortic Atherosclerosis (ICD10-I70.0). Electronically Signed   By: Iven Finn M.D.   On: 04/20/2022 23:32    Procedures Procedures    Medications Ordered in ED Medications  apixaban (ELIQUIS) tablet 5 mg (5 mg Oral Given 04/21/22 0107)  LORazepam (ATIVAN) tablet 1 mg (has no administration in time range)  escitalopram (LEXAPRO) tablet 20 mg (20 mg Oral Given 04/21/22 0107)  metoprolol tartrate (LOPRESSOR) tablet 100 mg (100 mg Oral Given 04/21/22 0106)  rosuvastatin (CRESTOR) tablet 40 mg (40 mg Oral Given 04/21/22 0106)  ipratropium-albuterol (DUONEB) 0.5-2.5 (3) MG/3ML nebulizer solution 3 mL (3 mLs Nebulization Given 04/20/22 2318)  methylPREDNISolone sodium succinate (SOLU-MEDROL) 125 mg/2 mL injection 125 mg (125 mg Intravenous Given 04/21/22 0155)  ipratropium-albuterol (DUONEB) 0.5-2.5 (3) MG/3ML nebulizer solution 3 mL (3 mLs Nebulization Given 04/21/22 0134)  benzonatate (TESSALON) capsule 100 mg (100 mg Oral Given 04/21/22 0305)    ED Course/ Medical Decision Making/ A&P Clinical Course as of 04/21/22 0325  Tue Apr 21, 2022  0205 Patient placed on 2L via [place] for comfort while at rest. Patient was noted to desat into the mid 80's with any degree of movement; hypoxic when she ambulated to the restroom. [KH]  0206 Lung sounds improved on repeat auscultation. Some wheezing does persist. Patient with mild tachypnea, dyspnea.  [KH]  0246 Lung sounds improved, but patient still appears symptomatic. Anticipate she will require q2 neb treatments. Given her hypoxemia with exertion, plan for admission.  [KH]  0310 Case discussed with Dr. Claria Dice who will admit. [KH]    Clinical Course User Index [KH] Antonietta Breach, PA-C                           Medical Decision Making Amount and/or Complexity of Data Reviewed Labs: ordered.  Risk Prescription drug management. Decision regarding  hospitalization.   This patient presents to the ED for concern of SOB and DOE x 3 days, this involves an extensive number of treatment options, and is a complaint that carries with it a high risk of complications and morbidity.  The differential diagnosis includes viral illness vs COPD exacerbation vs CHF exacerbation vs PNA vs PTX vs ACS.   Co morbidities that complicate the patient evaluation  COPD Afib (on Eliquis) Pulmonary HTN DM   Additional history obtained:  Additional history obtained from family, at bedside External records from outside source obtained and reviewed including pulmonary note from outpatient visit on 11/05/2021   Lab Tests:  I Ordered, and personally interpreted labs.  The pertinent results include:  RSV positive, BNP of 365.6 (up from 199.9 one year ago), anemia of 11 is stable   Imaging Studies ordered:  I ordered imaging studies including CXR  I independently visualized and interpreted imaging which showed no acute cardiopulmonary abnormality. I agree with the radiologist interpretation   Cardiac Monitoring:  The patient was maintained on a cardiac monitor.  I personally viewed and interpreted the cardiac monitored which showed an underlying rhythm of: Atrial fibrillation   Medicines ordered and prescription drug management:  I ordered medication including Solumedrol and Duoneb x3 for expiratory wheezing, SOB  Reevaluation of the  patient after these medicines showed that the patient improved I have reviewed the patients home medicines and have made adjustments as needed   Test Considered:  VBG   Consultations Obtained:  I requested consultation with the hospitalist service and discussed lab and imaging findings - they will assess the patient in the ED for admission.   Problem List / ED Course:  As above   Reevaluation:  After the interventions noted above, I reevaluated the patient and found that they have :improved   Social  Determinants of Health:  Insured patient   Dispostion:  After consideration of the diagnostic results and the patients response to treatment, I feel that the patent would benefit from admission for management of COPD exacerbation, likely exacerbated by RSV infection. Dr. Claria Dice of [facility] to assess patient for admission.         Final Clinical Impression(s) / ED Diagnoses Final diagnoses:  COPD with acute exacerbation ([place])  RSV infection    Rx / DC Orders ED Discharge Orders     None         Antonietta Breach, PA-C 04/21/22 0325    Veryl Speak, MD 04/21/22 [phone]

## 2022-04-21 NOTE — ED Notes (Signed)
Pt wheeled to restroom to change into gown and urinate. Pt's O2 dropped to 85% RA while moving from bed to wheelchair and wheelchair to bed. Pt placed on 2L [place], O2 now 95%. Pt unable to ambulate for long distance at this time d/t SHOB.

## 2022-04-21 NOTE — Plan of Care (Signed)

## 2022-04-21 NOTE — Evaluation (Signed)
Physical Therapy Evaluation Patient Details Name: Andrea Perry MRN: [id number] DOB: [birth year] Today's Date: 04/21/2022  History of Present Illness  72 yo female admitted 1/1 with SOB and respiratory failure due to COPD exacerbation. PMHx: anxiety, COPD, HTN, HLD, DM, OSA, melanoma, afib, obesity  Clinical Impression  Pt pleasant and reports family assist at home with pt normally able to walk without assist and perform ADLs on her own. Pt with decreased ability with transfers, gait, functional mobility and respiratory function who will benefit from acute therapy to maximize mobility, safety and independence to decrease burden of care.   SPO2 on RA at rest 94% with drop to 90% with transition to standing Pt maintained 93% on 2L with limited gait HR max 132 with activity        Recommendations for follow up therapy are one component of a multi-disciplinary discharge planning process, led by the attending physician.  Recommendations may be updated based on patient status, additional functional criteria and insurance authorization.  Follow Up Recommendations Home health PT      Assistance Recommended at Discharge Intermittent Supervision/Assistance  Patient can return home with the following  A little help with walking and/or transfers;A little help with bathing/dressing/bathroom;Assistance with cooking/housework;Direct supervision/assist for medications management;Assist for transportation;Help with stairs or ramp for entrance    Equipment Recommendations Rollator (4 wheels) (bariatric)  Recommendations for Other Services       Functional Status Assessment Patient has had a recent decline in their functional status and demonstrates the ability to make significant improvements in function in a reasonable and predictable amount of time.     Precautions / Restrictions Precautions Precautions: Fall;Other (comment) Precaution Comments: incontinent      Mobility  Bed Mobility Overal  bed mobility: Needs Assistance Bed Mobility: Supine to Sit, Sit to Supine     Supine to sit: HOB elevated, Min assist Sit to supine: HOB elevated, Min assist   General bed mobility comments: HOB elevated with reliance on stretcher rails to position and shift hips to transition to sitting EOB with HHA to elevate trunk. return to stretcher with increased time, use of foot stool to push off of and assist to elevate legs to surface    Transfers Overall transfer level: Needs assistance   Transfers: Sit to/from Stand Sit to Stand: Min guard           General transfer comment: cues for hand placement and safety. Pt able to pull up on foot stool handle to stand then grasp RW to step down to floor. return to bed pt able to sit then push off foot stool to scoot back    Ambulation/Gait Ambulation/Gait assistance: Min guard Gait Distance (Feet): 40 Feet Assistive device: Rolling walker (2 wheels) Gait Pattern/deviations: Step-through pattern, Decreased stride length, Trunk flexed   Gait velocity interpretation: <1.31 ft/sec, indicative of household ambulator   General Gait Details: cues for posture, proximity to RW and breathing technique. Pt maintaining 93% on 2L with limited gait  Stairs            Wheelchair Mobility    Modified Rankin (Stroke Patients Only)       Balance Overall balance assessment: Needs assistance   Sitting balance-Leahy Scale: Fair     Standing balance support: Bilateral upper extremity supported, During functional activity Standing balance-Leahy Scale: Poor Standing balance comment: bil UE support on RW in standing  Pertinent Vitals/Pain Pain Assessment Pain Assessment: No/denies pain    Home Living Family/patient expects to be discharged to:: Private residence Living Arrangements: Children Available Help at Discharge: Family;Available 24 hours/day Type of Home: House Home Access: Stairs to  enter Entrance Stairs-Rails: Right Entrance Stairs-Number of Steps: 5   Home Layout: One level Home Equipment: Shower seat;Standard Environmental consultant      Prior Function Prior Level of Function : Needs assist             Mobility Comments: pt reports she does not use AD for gait and reports no falls, uses power cart in stores ADLs Comments: pt states independence with ADLs, family does the IADLs     Hand Dominance        Extremity/Trunk Assessment   Upper Extremity Assessment Upper Extremity Assessment: Generalized weakness    Lower Extremity Assessment Lower Extremity Assessment: Generalized weakness;RLE deficits/detail;LLE deficits/detail RLE Deficits / Details: bil LE with increased body habitus and limited ROM, pt with noted black and dried plantar skin of bil feet. Pt reports she normally has to soak in a tub to clean her feet LLE Deficits / Details: bil LE with increased body habitus and limited ROM, pt with noted black and dried plantar skin of bil feet. Pt reports she normally has to soak in a tub to clean her feet    Cervical / Trunk Assessment Cervical / Trunk Assessment: Other exceptions Cervical / Trunk Exceptions: increased body habitus  Communication   Communication: No difficulties  Cognition Arousal/Alertness: Awake/alert Behavior During Therapy: WFL for tasks assessed/performed Overall Cognitive Status: Within Functional Limits for tasks assessed                                          General Comments      Exercises     Assessment/Plan    PT Assessment Patient needs continued PT services  PT Problem List Decreased strength;Decreased balance;Decreased range of motion;Decreased mobility;Decreased activity tolerance;Decreased skin integrity;Obesity       PT Treatment Interventions DME instruction;Functional mobility training;Balance training;Patient/family education;Gait training;Therapeutic activities;Stair training;Therapeutic  exercise    PT Goals (Current goals can be found in the Care Plan section)  Acute Rehab PT Goals Patient Stated Goal: return home PT Goal Formulation: With patient Time For Goal Achievement: 05/05/22 Potential to Achieve Goals: Fair    Frequency Min 3X/week     Co-evaluation               AM-PAC PT "6 Clicks" Mobility  Outcome Measure Help needed turning from your back to your side while in a flat bed without using bedrails?: A Little Help needed moving from lying on your back to sitting on the side of a flat bed without using bedrails?: A Little Help needed moving to and from a bed to a chair (including a wheelchair)?: A Little Help needed standing up from a chair using your arms (e.g., wheelchair or bedside chair)?: A Little Help needed to walk in hospital room?: A Little Help needed climbing 3-5 steps with a railing? : A Lot 6 Click Score: 17    End of Session Equipment Utilized During Treatment: Oxygen Activity Tolerance: Patient tolerated treatment well Patient left: in bed;with call bell/phone within reach Nurse Communication: Mobility status PT Visit Diagnosis: Other abnormalities of gait and mobility (R26.89);Muscle weakness (generalized) (M62.81)    Time: 0938-1829 PT Time Calculation (min) (ACUTE  ONLY): 26 min   Charges:   PT Evaluation $PT Eval Moderate Complexity: 1 Mod PT Treatments $Therapeutic Activity: 8-22 mins        Bayard Males, PT Acute Rehabilitation Services Office: [phone]   Sandy Salaam [place] 04/21/2022, 1:14 PM

## 2022-04-21 NOTE — Evaluation (Signed)
Occupational Therapy Evaluation Patient Details Name: Andrea Perry MRN: [id number] DOB: 06/26/1950 Today's Date: 04/21/2022   History of Present Illness 72 yo female admitted 1/1 with SOB and respiratory failure due to COPD exacerbation. PMHx: anxiety, COPD, HTN, HLD, DM, OSA, melanoma, afib, obesity   Clinical Impression   Andrea Perry was evaluated s/p the above admission list, 72e is typically indep with ADLs and mobility at baseline and lives with family to assist intermittently. Upon evaluation pt had limitations due to generalized weakness, SOB, increased O2 needs, unsteady gait, and poor activity tolerance. Overall she required min A to stand from elevated stretcher height and min G for in room mobility. Due to deficits listed below, she requires up to mod A for ADLs. OT to continue to follow acutely. Recommend d/c to home with [place].     Recommendations for follow up therapy are one component of a multi-disciplinary discharge planning process, led by the attending physician.  Recommendations may be updated based on patient status, additional functional criteria and insurance authorization.   Follow Up Recommendations  Home health OT     Assistance Recommended at Discharge Frequent or constant Supervision/Assistance  Patient can return home with the following A little help with walking and/or transfers;A little help with bathing/dressing/bathroom;Assistance with cooking/housework;Help with stairs or ramp for entrance;Assist for transportation;Direct supervision/assist for financial management;Direct supervision/assist for medications management    Functional Status Assessment  Patient has had a recent decline in their functional status and demonstrates the ability to make significant improvements in function in a reasonable and predictable amount of time.  Equipment Recommendations  None recommended by OT       Precautions / Restrictions Precautions Precautions: Fall;Other  (comment) Precaution Comments: incontinent Restrictions Weight Bearing Restrictions: No      Mobility Bed Mobility Overal bed mobility: Needs Assistance Bed Mobility: Supine to Sit     Supine to sit: Min assist     General bed mobility comments: increased time and effort, extended rest break needed    Transfers Overall transfer level: Needs assistance Equipment used: 1 person hand held assist Transfers: Sit to/from Stand Sit to Stand: Min assist           General transfer comment: from elevated stretcher      Balance Overall balance assessment: Needs assistance Sitting-balance support: No upper extremity supported, Feet supported Sitting balance-Leahy Scale: Fair     Standing balance support: Single extremity supported, During functional activity Standing balance-Leahy Scale: Fair                             ADL either performed or assessed with clinical judgement   ADL Overall ADL's : Needs assistance/impaired Eating/Feeding: Independent;Sitting   Grooming: Set up;Sitting   Upper Body Bathing: Set up;Sitting   Lower Body Bathing: Maximal assistance;Sit to/from stand   Upper Body Dressing : Set up;Sitting   Lower Body Dressing: Maximal assistance;Sit to/from stand   Toilet Transfer: Minimal assistance;Ambulation;BSC/3in1   Toileting- Clothing Manipulation and Hygiene: Minimal assistance;Sit to/from stand       Functional mobility during ADLs: Minimal assistance General ADL Comments: difficulty standing from elevated stretcher, increased SOB and HR with activity     Vision Baseline Vision/History: 1 Wears glasses Vision Assessment?: No apparent visual deficits     Perception Perception Perception Tested?: No   Praxis Praxis Praxis tested?: Not tested    Pertinent Vitals/Pain Pain Assessment Pain Assessment: No/denies pain     Hand  Dominance Right   Extremity/Trunk Assessment Upper Extremity Assessment Upper Extremity  Assessment: Generalized weakness   Lower Extremity Assessment Lower Extremity Assessment: Defer to PT evaluation RLE Deficits / Details: bil LE with increased body habitus and limited ROM, pt with noted black and dried plantar skin of bil feet. Pt reports she normally has to soak in a tub to clean her feet LLE Deficits / Details: bil LE with increased body habitus and limited ROM, pt with noted black and dried plantar skin of bil feet. Pt reports she normally has to soak in a tub to clean her feet   Cervical / Trunk Assessment Cervical / Trunk Assessment: Other exceptions Cervical / Trunk Exceptions: increased body habitus   Communication Communication Communication: No difficulties   Cognition Arousal/Alertness: Awake/alert Behavior During Therapy: WFL for tasks assessed/performed Overall Cognitive Status: Within Functional Limits for tasks assessed                                       General Comments  HR to 130s during mobility, VSS on RA, BP stable            Home Living Family/patient expects to be discharged to:: Private residence Living Arrangements: Children Available Help at Discharge: Family;Available PRN/intermittently Type of Home: House Home Access: Stairs to enter [employer] of Steps: 5 Entrance Stairs-Rails: Right Home Layout: One level     Bathroom Shower/Tub: Teacher, early years/pre: Standard     Home Equipment: Shower seat;Standard Proofreader (2 wheels)          Prior Functioning/Environment Prior Level of Function : Needs assist             Mobility Comments: pt reports she does not use AD for gait and reports no falls, uses power cart in stores ADLs Comments: pt states independence with ADLs, family does cooking and cleaning, pt manages meds and money, she does not drive        OT Problem List: Decreased strength;Decreased range of motion;Decreased activity tolerance;Impaired balance  (sitting and/or standing);Decreased safety awareness;Decreased knowledge of use of DME or AE;Decreased knowledge of precautions      OT Treatment/Interventions: Self-care/ADL training;Therapeutic exercise;DME and/or AE instruction;Therapeutic activities;Patient/family education;Balance training    OT Goals(Current goals can be found in the care plan section) Acute Rehab OT Goals Patient Stated Goal: to feel better OT Goal Formulation: With patient Time For Goal Achievement: 05/05/22 Potential to Achieve Goals: Good ADL Goals Pt Will Perform Lower Body Dressing: with min guard assist;sit to/from stand Pt Will Transfer to Toilet: with supervision;ambulating Additional ADL Goal #1: Pt will complete at least 8 minutes of standing functional task to demonstrate increased activity tolerance  OT Frequency: Min 2X/week       AM-PAC OT "6 Clicks" Daily Activity     Outcome Measure Help from another person eating meals?: None Help from another person taking care of personal grooming?: A Little Help from another person toileting, which includes using toliet, bedpan, or urinal?: A Little Help from another person bathing (including washing, rinsing, drying)?: A Lot Help from another person to put on and taking off regular upper body clothing?: A Little Help from another person to put on and taking off regular lower body clothing?: A Lot 6 Click Score: 17   End of Session Equipment Utilized During Treatment: Gait belt Nurse Communication: Mobility status ([place])  Activity Tolerance: Patient  tolerated treatment well Patient left: in chair;with call bell/phone within reach  OT Visit Diagnosis: Unsteadiness on feet (R26.81);Muscle weakness (generalized) (M62.81);Other abnormalities of gait and mobility (R26.89)                Time: 0677-0340 OT Time Calculation (min): 20 min Charges:  OT General Charges $OT Visit: 1 Visit OT Evaluation $OT Eval Moderate Complexity: 1 Mod   [place] Near D  Causey 04/21/2022, 4:26 PM

## 2022-04-22 DIAGNOSIS — J440 Chronic obstructive pulmonary disease with acute lower respiratory infection: Secondary | ICD-10-CM | POA: Diagnosis present

## 2022-04-22 DIAGNOSIS — Z85820 Personal history of malignant melanoma of skin: Secondary | ICD-10-CM | POA: Diagnosis not present

## 2022-04-22 DIAGNOSIS — I4819 Other persistent atrial fibrillation: Secondary | ICD-10-CM | POA: Diagnosis present

## 2022-04-22 DIAGNOSIS — I10 Essential (primary) hypertension: Secondary | ICD-10-CM

## 2022-04-22 DIAGNOSIS — I110 Hypertensive heart disease with heart failure: Secondary | ICD-10-CM | POA: Diagnosis present

## 2022-04-22 DIAGNOSIS — G4733 Obstructive sleep apnea (adult) (pediatric): Secondary | ICD-10-CM | POA: Diagnosis present

## 2022-04-22 DIAGNOSIS — E785 Hyperlipidemia, unspecified: Secondary | ICD-10-CM | POA: Diagnosis present

## 2022-04-22 DIAGNOSIS — I5033 Acute on chronic diastolic (congestive) heart failure: Secondary | ICD-10-CM | POA: Diagnosis present

## 2022-04-22 DIAGNOSIS — J441 Chronic obstructive pulmonary disease with (acute) exacerbation: Secondary | ICD-10-CM | POA: Diagnosis present

## 2022-04-22 DIAGNOSIS — Z7901 Long term (current) use of anticoagulants: Secondary | ICD-10-CM | POA: Diagnosis not present

## 2022-04-22 DIAGNOSIS — J121 Respiratory syncytial virus pneumonia: Secondary | ICD-10-CM

## 2022-04-22 DIAGNOSIS — T380X5A Adverse effect of glucocorticoids and synthetic analogues, initial encounter: Secondary | ICD-10-CM | POA: Diagnosis present

## 2022-04-22 DIAGNOSIS — R0602 Shortness of breath: Secondary | ICD-10-CM | POA: Diagnosis present

## 2022-04-22 DIAGNOSIS — Z1152 Encounter for screening for COVID-19: Secondary | ICD-10-CM | POA: Diagnosis not present

## 2022-04-22 DIAGNOSIS — K589 Irritable bowel syndrome without diarrhea: Secondary | ICD-10-CM | POA: Diagnosis present

## 2022-04-22 DIAGNOSIS — Z6841 Body Mass Index (BMI) 40.0 and over, adult: Secondary | ICD-10-CM | POA: Diagnosis not present

## 2022-04-22 DIAGNOSIS — K219 Gastro-esophageal reflux disease without esophagitis: Secondary | ICD-10-CM | POA: Diagnosis present

## 2022-04-22 DIAGNOSIS — F32A Depression, unspecified: Secondary | ICD-10-CM | POA: Diagnosis present

## 2022-04-22 DIAGNOSIS — E039 Hypothyroidism, unspecified: Secondary | ICD-10-CM | POA: Diagnosis not present

## 2022-04-22 DIAGNOSIS — F419 Anxiety disorder, unspecified: Secondary | ICD-10-CM | POA: Diagnosis present

## 2022-04-22 DIAGNOSIS — I4811 Longstanding persistent atrial fibrillation: Secondary | ICD-10-CM | POA: Diagnosis not present

## 2022-04-22 DIAGNOSIS — Z87891 Personal history of nicotine dependence: Secondary | ICD-10-CM | POA: Diagnosis not present

## 2022-04-22 DIAGNOSIS — Z8249 Family history of ischemic heart disease and other diseases of the circulatory system: Secondary | ICD-10-CM | POA: Diagnosis not present

## 2022-04-22 DIAGNOSIS — J9601 Acute respiratory failure with hypoxia: Secondary | ICD-10-CM | POA: Diagnosis not present

## 2022-04-22 DIAGNOSIS — E1165 Type 2 diabetes mellitus with hyperglycemia: Secondary | ICD-10-CM | POA: Diagnosis present

## 2022-04-22 DIAGNOSIS — I272 Pulmonary hypertension, unspecified: Secondary | ICD-10-CM | POA: Diagnosis present

## 2022-04-22 LAB — CBC
HCT: 35.7 % — ABNORMAL LOW (ref 36.0–46.0)
Hemoglobin: 11.7 g/dL — ABNORMAL LOW (ref 12.0–15.0)
MCH: 27.9 pg (ref 26.0–34.0)
MCHC: 32.8 g/dL (ref 30.0–36.0)
MCV: 85.2 fL (ref 80.0–100.0)
Platelets: 304 10*3/uL (ref 150–400)
RBC: 4.19 MIL/uL (ref 3.87–5.11)
RDW: 15.7 % — ABNORMAL HIGH (ref 11.5–15.5)
WBC: 16.7 10*3/uL — ABNORMAL HIGH (ref 4.0–10.5)
nRBC: 0 % (ref 0.0–0.2)

## 2022-04-22 LAB — GLUCOSE, CAPILLARY
Glucose-Capillary: 166 mg/dL — ABNORMAL HIGH (ref 70–99)
Glucose-Capillary: 210 mg/dL — ABNORMAL HIGH (ref 70–99)
Glucose-Capillary: 215 mg/dL — ABNORMAL HIGH (ref 70–99)
Glucose-Capillary: 275 mg/dL — ABNORMAL HIGH (ref 70–99)

## 2022-04-22 LAB — BASIC METABOLIC PANEL
Anion gap: 11 (ref 5–15)
BUN: 16 mg/dL (ref 8–23)
CO2: 28 mmol/L (ref 22–32)
Calcium: 8.9 mg/dL (ref 8.9–10.3)
Chloride: 97 mmol/L — ABNORMAL LOW (ref 98–111)
Creatinine, Ser: 1.1 mg/dL — ABNORMAL HIGH (ref 0.44–1.00)
GFR, Estimated: 54 mL/min — ABNORMAL LOW (ref >60)
Glucose, Bld: 173 mg/dL — ABNORMAL HIGH (ref 70–99)
Potassium: 4 mmol/L (ref 3.5–5.1)
Sodium: 136 mmol/L (ref 135–145)

## 2022-04-22 MED ORDER — LIP MEDEX EX OINT
TOPICAL_OINTMENT | CUTANEOUS | Status: DC | PRN
  Administered 2022-04-23: 75 via TOPICAL
  Filled 2022-04-22: qty 7

## 2022-04-22 MED ORDER — ACETAMINOPHEN 325 MG PO TABS
650.0000 mg | ORAL_TABLET | Freq: Four times a day (QID) | ORAL | Status: DC | PRN
  Administered 2022-04-22 – 2022-04-23 (×3): 650 mg via ORAL
  Filled 2022-04-22 (×3): qty 2

## 2022-04-22 NOTE — Progress Notes (Signed)
PROGRESS NOTE        PATIENT DETAILS Name: Andrea Perry Age: 72 y.o. Sex: female Date of Birth: 05-26-1950 Admit Date: 04/20/2022 Admitting Physician Quintella Baton, MD GUR:[facility], Hunt Oris, MD  Brief Summary: Patient is a 72 y.o.  female with history of COPD, HTN, HLD, DM-2, OSA-not on CPAP, morbid obesity-who presented with approximately 3-day history of cough/shortness of breath-she was found to have COPD exacerbation in the setting of RSV infection and subsequently admitted to the hospitalist service.  Significant events: 01/01>> admit to [facility]-COPD exacerbation-RSV infection.  Significant studies: 01/01>> CXR: No PNA  Significant microbiology data: 01/01>> RSV PCR: Positive 01/01>> COVID/influenza PCR: Negative  Procedures: None  Consults: None  Subjective: Slowly improving and had an episode of bronchospasm this morning requiring as needed bronchodilators.  Has significant lower extremity edema-more than baseline.  Objective: Vitals: Blood pressure 104/60, pulse 91, temperature 97.7 F (36.5 C), temperature source Oral, resp. rate 17, height '5\' 2"'$  (1.575 m), weight 121.6 kg, SpO2 94 %.   Exam: Gen Exam:Alert awake-not in any distress HEENT:atraumatic, normocephalic Chest: Coarse rhonchi all over-moving air CVS:S1S2 regular Abdomen:soft non tender, non distended Extremities:++edema Neurology: Non focal Skin: no rash  Pertinent Labs/Radiology:    Latest Ref Rng & Units 04/22/2022    4:45 AM 04/21/2022    4:17 AM 04/20/2022   11:14 PM  CBC  WBC 4.0 - 10.5 K/uL 16.7  9.8  8.8   Hemoglobin 12.0 - 15.0 g/dL 11.7  10.5  11.0   Hematocrit 36.0 - 46.0 % 35.7  34.2  35.4   Platelets 150 - 400 K/uL 304  259  258     Lab Results  Component Value Date   NA 136 04/22/2022   K 4.0 04/22/2022   CL 97 (L) 04/22/2022   CO2 28 04/22/2022      Assessment/Plan: Acute hypoxic respiratory failure COPD exacerbation due to RSV infection Slowly  improving-still with some bronchospasm this morning-not yet at baseline. Continue steroids/bronchodilators Slowly to attempt to titrate down FiO2.  Acute on chronic HFpEF Remains volume overloaded-significant lower extremity edema Continue IV Lasix-keep in negative balance Follow electrolytes/weights/intake/output closely.  Chronic atrial fibrillation Rate controlled this morning Continue metoprolol-remains on Eliquis Patient unclear whether she is on flecainide-she will ask family members to bring her home medication list.  HTN BP stable Continue Avapro/metoprolol/amlodipine  HLD Continue statin  DM-2 (A1c 7.0 on 10/18) with steroid-induced mild hyperglycemia CBG stable with SSI Resume oral hypoglycemics on discharge  Recent Labs    04/21/22 2257 04/22/22 0724 04/22/22 1120  GLUCAP 207* 166* 210*     Hypothyroidism Continue levothyroxine  Mood disorder Stable Continue Lexapro  Morbid Obesity: Estimated body mass index is 49.03 kg/m as calculated from the following:   Height as of this encounter: '5\' 2"'$  (1.575 m).   Weight as of this encounter: 121.6 kg.   Code status:   Code Status: Full Code   DVT Prophylaxis: SCDs Start: 04/21/22 0327 apixaban (ELIQUIS) tablet 5 mg    Family Communication: None at bedside   Disposition Plan: Status is: Observation The patient will require care spanning > 2 midnights and should be moved to inpatient because: Severity of illness.   Planned Discharge Destination:Home over the next several days.  Currently not stable for discharge given ongoing bronchospasm/volume overload.   Diet: Diet Order  Diet Heart Room service appropriate? Yes; Fluid consistency: Thin  Diet effective now                     Antimicrobial agents: Anti-infectives (From admission, onward)    Start     Dose/Rate Route Frequency Ordered Stop   04/22/22 0330  azithromycin (ZITHROMAX) tablet 500 mg       See Hyperspace for  full Linked Orders Report.   500 mg Oral Daily 04/21/22 0329 04/26/22 0959   04/21/22 0330  azithromycin (ZITHROMAX) 500 mg in sodium chloride 0.9 % 250 mL IVPB       See Hyperspace for full Linked Orders Report.   500 mg 250 mL/hr over 60 Minutes Intravenous Every 24 hours 04/21/22 0329 04/21/22 0552        MEDICATIONS: Scheduled Meds:  amLODipine  10 mg Oral Daily   apixaban  5 mg Oral BID   azithromycin  500 mg Oral Daily   benzonatate  100 mg Oral TID   escitalopram  20 mg Oral Daily   fesoterodine  4 mg Oral Daily   flecainide  50 mg Oral Q12H   furosemide  40 mg Intravenous BID   insulin aspart  0-15 Units Subcutaneous TID WC   insulin aspart  0-5 Units Subcutaneous QHS   irbesartan  150 mg Oral Daily   levothyroxine  50 mcg Oral Daily   metoprolol tartrate  100 mg Oral BID   pantoprazole  40 mg Oral Daily   predniSONE  40 mg Oral Q breakfast   rosuvastatin  40 mg Oral Daily   Continuous Infusions: PRN Meds:.albuterol, guaiFENesin, LORazepam   I have personally reviewed following labs and imaging studies  LABORATORY DATA: CBC: Recent Labs  Lab 04/20/22 2314 04/21/22 0417 04/22/22 0445  WBC 8.8 9.8 16.7*  NEUTROABS 6.7  --   --   HGB 11.0* 10.5* 11.7*  HCT 35.4* 34.2* 35.7*  MCV 89.6 88.8 85.2  PLT 258 259 778    Basic Metabolic Panel: Recent Labs  Lab 04/20/22 2314 04/21/22 0417 04/22/22 0445  NA 139  --  136  K 4.4  --  4.0  CL 102  --  97*  CO2 24  --  28  GLUCOSE 151*  --  173*  BUN 11  --  16  CREATININE 1.00 0.94 1.10*  CALCIUM 8.6*  --  8.9    GFR: Estimated Creatinine Clearance: 60 mL/min (A) (by C-G formula based on SCr of 1.1 mg/dL (H)).  Liver Function Tests: Recent Labs  Lab 04/20/22 2314  AST 24  ALT 13  ALKPHOS 75  BILITOT 0.6  PROT 6.4*  ALBUMIN 3.1*   No results for input(s): "LIPASE", "AMYLASE" in the last 168 hours. No results for input(s): "AMMONIA" in the last 168 hours.  Coagulation Profile: Recent Labs   Lab 04/20/22 2314  INR 1.2    Cardiac Enzymes: No results for input(s): "CKTOTAL", "CKMB", "CKMBINDEX", "TROPONINI" in the last 168 hours.  BNP (last 3 results) No results for input(s): "PROBNP" in the last 8760 hours.  Lipid Profile: No results for input(s): "CHOL", "HDL", "LDLCALC", "TRIG", "CHOLHDL", "LDLDIRECT" in the last 72 hours.  Thyroid Function Tests: No results for input(s): "TSH", "T4TOTAL", "FREET4", "T3FREE", "THYROIDAB" in the last 72 hours.  Anemia Panel: No results for input(s): "VITAMINB12", "FOLATE", "FERRITIN", "TIBC", "IRON", "RETICCTPCT" in the last 72 hours.  Urine analysis:    Component Value Date/Time   COLORURINE YELLOW 07/18/2021 1418  APPEARANCEUR CLOUDY (A) 07/18/2021 1418   LABSPEC 1.015 07/18/2021 1418   PHURINE 5.5 07/18/2021 1418   GLUCOSEU NEGATIVE 07/18/2021 1418   HGBUR NEGATIVE 07/18/2021 1418   BILIRUBINUR NEGATIVE 07/18/2021 1418   KETONESUR NEGATIVE 07/18/2021 1418   PROTEINUR NEGATIVE 05/23/2020 2342   UROBILINOGEN 0.2 07/18/2021 1418   NITRITE POSITIVE (A) 07/18/2021 1418   LEUKOCYTESUR TRACE (A) 07/18/2021 1418    Sepsis Labs: Lactic Acid, Venous No results found for: "LATICACIDVEN"  MICROBIOLOGY: Recent Results (from the past 240 hour(s))  Resp panel by RT-PCR (RSV, Flu A&B, Covid) Anterior Nasal Swab     Status: Abnormal   Collection Time: 04/20/22 11:09 PM   Specimen: Anterior Nasal Swab  Result Value Ref Range Status   SARS Coronavirus 2 by RT PCR NEGATIVE NEGATIVE Final    Comment: (NOTE) SARS-CoV-2 target nucleic acids are NOT DETECTED.  The SARS-CoV-2 RNA is generally detectable in upper respiratory specimens during the acute phase of infection. The lowest concentration of SARS-CoV-2 viral copies this assay can detect is 138 copies/mL. A negative result does not preclude SARS-Cov-2 infection and should not be used as the sole basis for treatment or other patient management decisions. A negative result may  occur with  improper specimen collection/handling, submission of specimen other than nasopharyngeal swab, presence of viral mutation(s) within the areas targeted by this assay, and inadequate number of viral copies(<138 copies/mL). A negative result must be combined with clinical observations, patient history, and epidemiological information. The expected result is Negative.  Fact Sheet for Patients:  EntrepreneurPulse.com.au  Fact Sheet for Healthcare Providers:  IncredibleEmployment.be  This test is no t yet approved or cleared by the [place] FDA and  has been authorized for detection and/or diagnosis of SARS-CoV-2 by FDA under an Emergency Use Authorization (EUA). This EUA will remain  in effect (meaning this test can be used) for the duration of the COVID-19 declaration under Section 564(b)(1) of the Act, 21 U.S.C.section 360bbb-3(b)(1), unless the authorization is terminated  or revoked sooner.       Influenza A by PCR NEGATIVE NEGATIVE Final   Influenza B by PCR NEGATIVE NEGATIVE Final    Comment: (NOTE) The Xpert Xpress SARS-CoV-2/FLU/RSV plus assay is intended as an aid in the diagnosis of influenza from Nasopharyngeal swab specimens and should not be used as a sole basis for treatment. Nasal washings and aspirates are unacceptable for Xpert Xpress SARS-CoV-2/FLU/RSV testing.  Fact Sheet for Patients: EntrepreneurPulse.com.au  Fact Sheet for Healthcare Providers: IncredibleEmployment.be  This test is not yet approved or cleared by the [place] FDA and has been authorized for detection and/or diagnosis of SARS-CoV-2 by FDA under an Emergency Use Authorization (EUA). This EUA will remain in effect (meaning this test can be used) for the duration of the COVID-19 declaration under Section 564(b)(1) of the Act, 21 U.S.C. section 360bbb-3(b)(1), unless the authorization is terminated  or revoked.     Resp Syncytial Virus by PCR POSITIVE (A) NEGATIVE Final    Comment: (NOTE) Fact Sheet for Patients: EntrepreneurPulse.com.au  Fact Sheet for Healthcare Providers: IncredibleEmployment.be  This test is not yet approved or cleared by the [place] FDA and has been authorized for detection and/or diagnosis of SARS-CoV-2 by FDA under an Emergency Use Authorization (EUA). This EUA will remain in effect (meaning this test can be used) for the duration of the COVID-19 declaration under Section 564(b)(1) of the Act, 21 U.S.C. section 360bbb-3(b)(1), unless the authorization is terminated or revoked.  Performed at [facility], [place] [address] [place], [place] [address]     RADIOLOGY STUDIES/RESULTS: DG Chest Port 1 View  Result Date: 04/20/2022 CLINICAL DATA:  sob, cough EXAM: PORTABLE CHEST 1 VIEW COMPARISON:  Chest x-ray 06/13/2020 FINDINGS: The heart and mediastinal contours are unchanged. Aortic calcification. No focal consolidation. No pulmonary edema. No pleural effusion. No pneumothorax. No acute osseous abnormality. IMPRESSION: 1. No active disease. 2.  Aortic Atherosclerosis (ICD10-I70.0). Electronically Signed   By: Iven Finn M.D.   On: 04/20/2022 23:32     LOS: 1 day   Oren Binet, MD  [facility]    To contact the attending provider between 7A-7P or the covering provider during after hours 7P-7A, please log into the web site [email or web address] and access using universal [place] password for that web site. If you do not have the password, please call the hospital operator.  04/22/2022, 12:22 PM

## 2022-04-22 NOTE — TOC Initial Note (Addendum)
Transition of Care [facility]) - Initial/Assessment Note    Patient Details  Name: Andrea Perry MRN: [id number] Date of Birth: [birth year]  Transition of Care [facility]) CM/SW Contact:    Tom-Johnson, Renea Ee, RN Phone Number: 04/22/2022, 2:50 PM  Clinical Narrative:                  CM spoke with patient at bedside about needs for hospital transition. Admitted for ARF with Hypoxia, on Oral abx, 2L O2 acute, does not use home O2.  Patient is from home with son, grand daughter and niece. Has a RW, shower seat. PT recommended a rollator, order called in to [place] states patient had a walker within 5 years, patient will have to pay out of pocket. CM notified patient and she declined to pay out of pocket. States she will use her regular walker.  Home health PT/OT referral called in to [facility] per patient's request, [facility] voiced acceptance and info on AVS.  PCP is Biagio Borg, MD and uses [facility] on [address].  Family to transport at discharge. CM will continue to follow as patient progresses towards discharge.          Barriers to Discharge: Continued Medical Work up   Patient Goals and CMS Choice Patient states their goals for this hospitalization and ongoing recovery are:: To return home CMS Medicare.gov Compare Post Acute Care list provided to:: Patient Choice offered to / list presented to : Patient      Expected Discharge Plan and Services   Discharge Planning Services: CM Consult Post Acute Care Choice: [place] arrangements for the past 2 months: Single Family Home                 DME Arranged: Walker rolling with seat         HH Arranged: PT, OT HH Agency: [place] Date [facility] Agency Contacted: 04/22/22 Time HH Agency Contacted: 43 Representative spoke with at [place]: Tommi Rumps  Prior Living Arrangements/Services Living arrangements for the past 2 months: [place] Lives with:: Adult Children (Son, grand  daughter and niece.) Patient language and need for interpreter reviewed:: Yes Do you feel safe going back to the place where you live?: Yes      Need for Family Participation in Patient Care: Yes (Comment) Care giver support system in place?: Yes (comment) Current home services: DME (RW, shower seat) Criminal Activity/Legal Involvement Pertinent to Current Situation/Hospitalization: No - Comment as needed  Activities of Daily Living Home Assistive Devices/Equipment: None ADL Screening (condition at time of admission) Patient's cognitive ability adequate to safely complete daily activities?: Yes Is the patient deaf or have difficulty hearing?: No Does the patient have difficulty seeing, even when wearing glasses/contacts?: No Does the patient have difficulty concentrating, remembering, or making decisions?: No Patient able to express need for assistance with ADLs?: Yes Does the patient have difficulty dressing or bathing?: No Independently performs ADLs?: Yes (appropriate for developmental age) Does the patient have difficulty walking or climbing stairs?: No Weakness of Legs: Both Weakness of Arms/Hands: Both  Permission Sought/Granted Permission sought to share information with : Case Manager, Customer service manager, Family Supports Permission granted to share information with : Yes, Verbal Permission Granted              Emotional Assessment Appearance:: Appears stated age Attitude/Demeanor/Rapport: Engaged, Gracious Affect (typically observed): Accepting, Appropriate, Calm, Hopeful, Pleasant Orientation: : Oriented to Self, Oriented to Place, Oriented  to  Time, Oriented to Situation Alcohol / Substance Use: Not Applicable Psych Involvement: No (comment)  Admission diagnosis:  Acute respiratory failure (HCC) [J96.00] COPD with acute exacerbation (HCC) [J44.1] RSV infection [B33.8] Acute respiratory failure with hypoxemia (HCC) [J96.01] COPD exacerbation ([place])  [J44.1] Patient Active Problem List   Diagnosis Date Noted   COPD exacerbation ([place]) 04/22/2022   Acute respiratory failure with hypoxemia (HCC) 04/21/2022   RSV (respiratory syncytial virus pneumonia) 04/21/2022   Anxiety and depression 04/21/2022   Acute respiratory failure ([place]) 04/21/2022   Dysuria 01/12/2021   Vitamin D deficiency 07/15/2020   Low grade squamous intraepithelial lesion (LGSIL) on cervicovaginal cytologic smear 07/09/2020   Menopausal syndrome 07/09/2020   Stress incontinence of urine 07/09/2020   Muscle cramps 06/18/2020   Hypomagnesemia 06/11/2020   Physical deconditioning 06/03/2020   COVID-19 05/21/2020   CHF (congestive heart failure) ([place]) 05/21/2020   Suspected COVID-19 virus infection 05/09/2020   Complex care coordination 02/27/2019   Healthcare maintenance 02/09/2019   Medication management 12/01/2018   Shortness of breath 12/01/2018   Former smoker 12/01/2018   Atrial fibrillation, rapid ([place]) 04/29/2018   Acute gastroenteritis 03/23/2018   GERD (gastroesophageal reflux disease) 01/03/2018   Proteinuria 09/15/2017   Insomnia 03/17/2017   Rash 11/04/2016   Hematoma 11/04/2016   Pre-operative examination for internal medicine 09/11/2016   Back wound, unspecified laterality, initial encounter 05/26/2016   S/P lumbar laminectomy 03/19/2016   Overactive bladder 10/20/2015   Pulmonary HTN ([place]) 06/19/2015   Mild obstructive sleep apnea 06/19/2015   Snoring 06/19/2015   Abdominal pain 04/18/2015   Cough 03/08/2015   A-fib ([place]) 02/05/2014   Preop cardiovascular exam 02/05/2014   Arthritis of knee 02/05/2014   Arm pain, right 08/31/2013   Low back pain 09/26/2011   Leg pain 07/07/2011   Dizziness 07/07/2011   Menopausal disorder 07/07/2011   COPD with acute exacerbation ([place]) 02/25/2011   Hemorrhage of rectum and anus 11/04/2010   Internal thrombosed hemorrhoids 11/04/2010   Internal hemorrhoids with other complication 04/22/7251   External  hemorrhoids without mention of complication 66/44/0347   Dyspepsia 09/24/2010   Peripheral edema 09/24/2010   Diabetes ([place]) 09/21/2010   Encounter for well adult exam with abnormal findings 09/21/2010   IBS 06/20/2009   HEMATOCHEZIA 06/20/2009   Elevated lipids 08/16/2008   Anxiety state 08/16/2008   Depression 08/16/2008   Allergic rhinitis 08/16/2008   Asthma 08/16/2008   COPD (chronic obstructive pulmonary disease) ([place]) 08/16/2008   GENITAL HERPES 12/03/2006   Hypothyroidism 12/03/2006   Essential hypertension 12/03/2006   PCP:  Biagio Borg, MD Pharmacy:   [place] ([place]), [place] - [address] [place] [address] Phone: [phone] Fax: [phone]  [id number] [place] [place]Phone: [phone]: [phone]    Social Determinants of Health (SDOH) Social History: SDOH Screenings   Food Insecurity: No Food Insecurity (04/21/2022)  Housing: Low Risk  (04/21/2022)  Transportation Needs: No Transportation Needs (04/21/2022)  Utilities: Not At Risk (01/16/2022)  Alcohol Screen: Low Risk  (01/16/2022)  Depression (PHQ2-9): Low Risk  (02/04/2022)  Financial Resource Strain: Low Risk  (01/16/2022)  Physical Activity: Inactive (01/16/2022)  Social Connections: Socially Isolated (01/16/2022)  Stress: No Stress Concern Present (01/16/2022)  Tobacco Use: Medium Risk (04/20/2022)   SDOH Interventions: Transportation Interventions: Intervention Not Indicated, Inpatient TOC, Patient Resources (Friends/Family)  Readmission Risk Interventions     No data to display

## 2022-04-22 NOTE — Progress Notes (Signed)
Initial Nutrition Assessment  DOCUMENTATION CODES:   Morbid obesity  INTERVENTION:  - Continue current diet order.   NUTRITION DIAGNOSIS:  No nutrition diagnosis at this time.   REASON FOR ASSESSMENT:   Consult Assessment of nutrition requirement/status  ASSESSMENT:   72 y.o. female admits related to SOB. PMH includes: anxiety, depression, COPD, HTN, dyslipidemia, DM, melanoma, afib. Pt is currently receiving medical management for acute respiratory failure with hypoxemia.  Meds reviewed: Lasix, sliding scale insulin. Labs reviewed.   The pt reports that she has been eating well since admission. She states that she was also eating well PTA. No significant wt changes. Pt declines diet education at this time.   NUTRITION - FOCUSED PHYSICAL EXAM:  WNL - no wasting noted.   Diet Order:   Diet Order             Diet Heart Room service appropriate? Yes; Fluid consistency: Thin  Diet effective now                   EDUCATION NEEDS:      Skin:  Skin Assessment: Reviewed RN Assessment  Last BM:  1/1  Height:   Ht Readings from Last 1 Encounters:  04/20/22 '5\' 2"'$  (1.575 m)    Weight:   Wt Readings from Last 1 Encounters:  04/20/22 121.6 kg    Ideal Body Weight:     BMI:  Body mass index is 49.03 kg/m.  Estimated Nutritional Needs:   Kcal:  5284-1324 kcals  Protein:  90-110 gm  Fluid:  >/= 1.8 L  Thalia Bloodgood, RD, LDN, CNSC.

## 2022-04-23 DIAGNOSIS — I10 Essential (primary) hypertension: Secondary | ICD-10-CM | POA: Diagnosis not present

## 2022-04-23 DIAGNOSIS — J9601 Acute respiratory failure with hypoxia: Secondary | ICD-10-CM | POA: Diagnosis not present

## 2022-04-23 DIAGNOSIS — E039 Hypothyroidism, unspecified: Secondary | ICD-10-CM | POA: Diagnosis not present

## 2022-04-23 DIAGNOSIS — J121 Respiratory syncytial virus pneumonia: Secondary | ICD-10-CM | POA: Diagnosis not present

## 2022-04-23 LAB — GLUCOSE, CAPILLARY
Glucose-Capillary: 162 mg/dL — ABNORMAL HIGH (ref 70–99)
Glucose-Capillary: 203 mg/dL — ABNORMAL HIGH (ref 70–99)
Glucose-Capillary: 278 mg/dL — ABNORMAL HIGH (ref 70–99)
Glucose-Capillary: 239 mg/dL — ABNORMAL HIGH (ref 70–99)

## 2022-04-23 LAB — BASIC METABOLIC PANEL
Anion gap: 12 (ref 5–15)
BUN: 20 mg/dL (ref 8–23)
CO2: 31 mmol/L (ref 22–32)
Calcium: 8.2 mg/dL — ABNORMAL LOW (ref 8.9–10.3)
Chloride: 94 mmol/L — ABNORMAL LOW (ref 98–111)
Creatinine, Ser: 1.15 mg/dL — ABNORMAL HIGH (ref 0.44–1.00)
GFR, Estimated: 52 mL/min — ABNORMAL LOW (ref >60)
Glucose, Bld: 117 mg/dL — ABNORMAL HIGH (ref 70–99)
Potassium: 3.6 mmol/L (ref 3.5–5.1)
Sodium: 137 mmol/L (ref 135–145)

## 2022-04-23 MED ORDER — IPRATROPIUM-ALBUTEROL 0.5-2.5 (3) MG/3ML IN SOLN
3.0000 mL | Freq: Four times a day (QID) | RESPIRATORY_TRACT | Status: DC
  Administered 2022-04-23 – 2022-04-25 (×9): 3 mL via RESPIRATORY_TRACT
  Filled 2022-04-23 (×9): qty 3

## 2022-04-23 MED ORDER — BUDESONIDE 0.25 MG/2ML IN SUSP
0.2500 mg | Freq: Two times a day (BID) | RESPIRATORY_TRACT | Status: DC
  Administered 2022-04-23 – 2022-04-24 (×4): 0.25 mg via RESPIRATORY_TRACT
  Filled 2022-04-23 (×4): qty 2

## 2022-04-23 MED ORDER — FUROSEMIDE 10 MG/ML IJ SOLN
40.0000 mg | Freq: Two times a day (BID) | INTRAMUSCULAR | Status: DC
  Administered 2022-04-23 – 2022-04-24 (×4): 40 mg via INTRAVENOUS
  Filled 2022-04-23 (×4): qty 4

## 2022-04-23 MED ORDER — METHYLPREDNISOLONE SODIUM SUCC 40 MG IJ SOLR
40.0000 mg | Freq: Two times a day (BID) | INTRAMUSCULAR | Status: DC
  Administered 2022-04-23 – 2022-04-24 (×4): 40 mg via INTRAVENOUS
  Filled 2022-04-23 (×4): qty 1

## 2022-04-23 NOTE — Progress Notes (Signed)
Occupational Therapy Treatment Patient Details Name: Andrea Perry MRN: [id number] DOB: 29-Apr-1950 Today's Date: 04/23/2022   History of present illness 72 yo female admitted 1/1 with SOB and respiratory failure due to COPD exacerbation. PMHx: anxiety, COPD, HTN, HLD, DM, OSA, melanoma, afib, obesity   OT comments  Patient in supine and agreeable to OT session. Patient seen to address standing tolerance with patient tolerating 2 minutes of 1st attempt and 3 on 2nd. Patient is one person HHA for ambulating to bathroom and demonstrated gains with toilet hygiene with patient performing with MI. Patient is motivated towards therapy and recovery. Acute OT to continue to follow to increase standing tolerance and self care tasks. Discharge recommendations continue to be appropriate.    Recommendations for follow up therapy are one component of a multi-disciplinary discharge planning process, led by the attending physician.  Recommendations may be updated based on patient status, additional functional criteria and insurance authorization.    Follow Up Recommendations  Home health OT     Assistance Recommended at Discharge Frequent or constant Supervision/Assistance  Patient can return home with the following  A little help with walking and/or transfers;A little help with bathing/dressing/bathroom;Assistance with cooking/housework;Help with stairs or ramp for entrance;Assist for transportation;Direct supervision/assist for financial management;Direct supervision/assist for medications management   Equipment Recommendations  None recommended by OT    Recommendations for Other Services      Precautions / Restrictions Precautions Precautions: Fall;Other (comment) Restrictions Weight Bearing Restrictions: No       Mobility Bed Mobility Overal bed mobility: Needs Assistance Bed Mobility: Supine to Sit     Supine to sit: Min assist     General bed mobility comments: increased time and  assistance scooting to EOB    Transfers Overall transfer level: Needs assistance Equipment used: 1 person hand held assist Transfers: Sit to/from Stand Sit to Stand: Min assist           General transfer comment: from EOB patient was min guard assist and min assist to stand from recliner. Patient used bathroom rail to assist with standing from toilet     Balance Overall balance assessment: Needs assistance Sitting-balance support: No upper extremity supported, Feet supported Sitting balance-Leahy Scale: Fair     Standing balance support: Single extremity supported, During functional activity Standing balance-Leahy Scale: Fair Standing balance comment: single extremity assist for balance with standing. Patient tolerated 2 minutes of static standing on first attempt and 3 on 2nd.                           ADL either performed or assessed with clinical judgement   ADL Overall ADL's : Needs assistance/impaired     Grooming: Set up;Sitting Grooming Details (indicate cue type and reason): in recliner                 Toilet Transfer: Minimal assistance;Min guard;Ambulation;Regular Glass blower/designer Details (indicate cue type and reason): min assist to min guard assist to ambulate to bathroom Toileting- Clothing Manipulation and Hygiene: Modified independent;Sitting/lateral lean              Extremity/Trunk Assessment              Vision       Perception     Praxis      Cognition Arousal/Alertness: Awake/alert Behavior During Therapy: WFL for tasks assessed/performed Overall Cognitive Status: Within Functional Limits for tasks assessed  Exercises      Shoulder Instructions       General Comments seated on RA SpO2 92 after standing on 2 liters patient was 97%    Pertinent Vitals/ Pain       Pain Assessment Pain Assessment: No/denies pain  Home Living                                           Prior Functioning/Environment              Frequency  Min 2X/week        Progress Toward Goals  OT Goals(current goals can now be found in the care plan section)  Progress towards OT goals: Progressing toward goals  Acute Rehab OT Goals Patient Stated Goal: go home OT Goal Formulation: With patient Time For Goal Achievement: 05/05/22 Potential to Achieve Goals: Good ADL Goals Pt Will Perform Lower Body Dressing: with min guard assist;sit to/from stand Pt Will Transfer to Toilet: with supervision;ambulating Additional ADL Goal #1: Pt will complete at least 8 minutes of standing functional task to demonstrate increased activity tolerance  Plan Discharge plan remains appropriate    Co-evaluation                 AM-PAC OT "6 Clicks" Daily Activity     Outcome Measure   Help from another person eating meals?: None Help from another person taking care of personal grooming?: A Little Help from another person toileting, which includes using toliet, bedpan, or urinal?: A Little Help from another person bathing (including washing, rinsing, drying)?: A Lot Help from another person to put on and taking off regular upper body clothing?: A Little Help from another person to put on and taking off regular lower body clothing?: A Lot 6 Click Score: 17    End of Session    OT Visit Diagnosis: Unsteadiness on feet (R26.81);Muscle weakness (generalized) (M62.81);Other abnormalities of gait and mobility (R26.89)   Activity Tolerance Patient tolerated treatment well   Patient Left in chair;with call bell/phone within reach   Nurse Communication Mobility status (purwick needed)        Time: 7544-9201 OT Time Calculation (min): 27 min  Charges: OT General Charges $OT Visit: 1 Visit OT Treatments $Self Care/Home Management : 8-22 mins $Therapeutic Activity: 8-22 mins  Lodema Hong, [place]  Office  [place] 04/23/2022, 11:12 AM

## 2022-04-23 NOTE — Progress Notes (Signed)
PROGRESS NOTE        PATIENT DETAILS Name: Andrea Perry Age: 72 y.o. Sex: female Date of Birth: [birth year] Admit Date: 04/20/2022 Admitting Physician Evalee Mutton Kristeen Mans, MD [id number], Hunt Oris, MD  Brief Summary: Patient is a 72 y.o.  female with history of COPD, HTN, HLD, DM-2, OSA-not on CPAP, morbid obesity-who presented with approximately 3-day history of cough/shortness of breath-she was found to have COPD exacerbation in the setting of RSV infection and subsequently admitted to the hospitalist service.  Significant events: 01/01>> admit to [facility]-COPD exacerbation-RSV infection.  Significant studies: 01/01>> CXR: No PNA  Significant microbiology data: 01/01>> RSV PCR: Positive 01/01>> COVID/influenza PCR: Negative  Procedures: None  Consults: None  Subjective: Slowly improving-continues to have wheezing spells-lower extremity edema better but still not yet at baseline.  Objective: Vitals: Blood pressure 121/64, pulse 89, temperature 98.1 F (36.7 C), temperature source Oral, resp. rate 18, height '5\' 2"'$  (1.575 m), weight 121.6 kg, SpO2 96 %.   Exam: Gen Exam:Alert awake-not in any distress HEENT:atraumatic, normocephalic Chest: B/L clear to auscultation anteriorly CVS:S1S2 regular Abdomen:soft non tender, non distended Extremities:++ edema Neurology: Non focal Skin: no rash  Pertinent Labs/Radiology:    Latest Ref Rng & Units 04/22/2022    4:45 AM 04/21/2022    4:17 AM 04/20/2022   11:14 PM  CBC  WBC 4.0 - 10.5 K/uL 16.7  9.8  8.8   Hemoglobin 12.0 - 15.0 g/dL 11.7  10.5  11.0   Hematocrit 36.0 - 46.0 % 35.7  34.2  35.4   Platelets 150 - 400 K/uL 304  259  258     Lab Results  Component Value Date   NA 137 04/23/2022   K 3.6 04/23/2022   CL 94 (L) 04/23/2022   CO2 31 04/23/2022      Assessment/Plan: Acute hypoxic respiratory failure COPD exacerbation due to RSV infection Slow improvement continues Still wheezing but moving air  well. Continue steroids/bronchodilators Continue to attempt to titrate down FiO2.  Acute on chronic HFpEF Volume status better but still with significant lower extremity edema Continue IV Lasix Keep in negative balance Follow electrolytes/weights/intake/output closely.  Chronic atrial fibrillation Rate controlled this morning Continue metoprolol-remains on Eliquis  HTN BP stable Continue Avapro/metoprolol/amlodipine  HLD Continue statin  DM-2 (A1c 7.0 on 10/18) with steroid-induced mild hyperglycemia CBG stable with SSI Resume oral hypoglycemics on discharge  Recent Labs    04/22/22 2101 04/23/22 0716 04/23/22 1116  GLUCAP 275* 162* 203*      Hypothyroidism Continue levothyroxine  Mood disorder Stable Continue Lexapro  Morbid Obesity: Estimated body mass index is 49.03 kg/m as calculated from the following:   Height as of this encounter: '5\' 2"'$  (1.575 m).   Weight as of this encounter: 121.6 kg.   Code status:   Code Status: Full Code   DVT Prophylaxis: SCDs Start: 04/21/22 0327 apixaban (ELIQUIS) tablet 5 mg    Family Communication: None at bedside   Disposition Plan: Status is: Observation The patient will require care spanning > 2 midnights and should be moved to inpatient because: Severity of illness.   Planned Discharge Destination:Home over the next several days.  Currently not stable for discharge given ongoing bronchospasm/volume overload.   Diet: Diet Order             Diet Heart Room service appropriate? Yes; Fluid consistency:  Thin; Fluid restriction: 1500 mL Fluid  Diet effective now                     Antimicrobial agents: Anti-infectives (From admission, onward)    Start     Dose/Rate Route Frequency Ordered Stop   04/22/22 0330  azithromycin (ZITHROMAX) tablet 500 mg       See Hyperspace for full Linked Orders Report.   500 mg Oral Daily 04/21/22 0329 04/26/22 0959   04/21/22 0330  azithromycin (ZITHROMAX) 500 mg in  sodium chloride 0.9 % 250 mL IVPB       See Hyperspace for full Linked Orders Report.   500 mg 250 mL/hr over 60 Minutes Intravenous Every 24 hours 04/21/22 0329 04/21/22 0552        MEDICATIONS: Scheduled Meds:  amLODipine  10 mg Oral Daily   apixaban  5 mg Oral BID   azithromycin  500 mg Oral Daily   benzonatate  100 mg Oral TID   budesonide (PULMICORT) nebulizer solution  0.25 mg Nebulization BID   escitalopram  20 mg Oral Daily   fesoterodine  4 mg Oral Daily   insulin aspart  0-15 Units Subcutaneous TID WC   insulin aspart  0-5 Units Subcutaneous QHS   ipratropium-albuterol  3 mL Nebulization QID   irbesartan  150 mg Oral Daily   levothyroxine  50 mcg Oral Daily   methylPREDNISolone (SOLU-MEDROL) injection  40 mg Intravenous Q12H   metoprolol tartrate  100 mg Oral BID   pantoprazole  40 mg Oral Daily   rosuvastatin  40 mg Oral Daily   Continuous Infusions: PRN Meds:.acetaminophen, albuterol, guaiFENesin, lip balm, LORazepam   I have personally reviewed following labs and imaging studies  LABORATORY DATA: CBC: Recent Labs  Lab 04/20/22 2314 04/21/22 0417 04/22/22 0445  WBC 8.8 9.8 16.7*  NEUTROABS 6.7  --   --   HGB 11.0* 10.5* 11.7*  HCT 35.4* 34.2* 35.7*  MCV 89.6 88.8 85.2  PLT 258 259 304     Basic Metabolic Panel: Recent Labs  Lab 04/20/22 2314 04/21/22 0417 04/22/22 0445 04/23/22 0327  NA 139  --  136 137  K 4.4  --  4.0 3.6  CL 102  --  97* 94*  CO2 24  --  28 31  GLUCOSE 151*  --  173* 117*  BUN 11  --  16 20  CREATININE 1.00 0.94 1.10* 1.15*  CALCIUM 8.6*  --  8.9 8.2*     GFR: Estimated Creatinine Clearance: 57.4 mL/min (A) (by C-G formula based on SCr of 1.15 mg/dL (H)).  Liver Function Tests: Recent Labs  Lab 04/20/22 2314  AST 24  ALT 13  ALKPHOS 75  BILITOT 0.6  PROT 6.4*  ALBUMIN 3.1*    No results for input(s): "LIPASE", "AMYLASE" in the last 168 hours. No results for input(s): "AMMONIA" in the last 168  hours.  Coagulation Profile: Recent Labs  Lab 04/20/22 2314  INR 1.2     Cardiac Enzymes: No results for input(s): "CKTOTAL", "CKMB", "CKMBINDEX", "TROPONINI" in the last 168 hours.  BNP (last 3 results) No results for input(s): "PROBNP" in the last 8760 hours.  Lipid Profile: No results for input(s): "CHOL", "HDL", "LDLCALC", "TRIG", "CHOLHDL", "LDLDIRECT" in the last 72 hours.  Thyroid Function Tests: No results for input(s): "TSH", "T4TOTAL", "FREET4", "T3FREE", "THYROIDAB" in the last 72 hours.  Anemia Panel: No results for input(s): "VITAMINB12", "FOLATE", "FERRITIN", "TIBC", "IRON", "RETICCTPCT" in the last 72  hours.  Urine analysis:    Component Value Date/Time   COLORURINE YELLOW 07/18/2021 1418   APPEARANCEUR CLOUDY (A) 07/18/2021 1418   LABSPEC 1.015 07/18/2021 1418   PHURINE 5.5 07/18/2021 1418   GLUCOSEU NEGATIVE 07/18/2021 1418   HGBUR NEGATIVE 07/18/2021 1418   BILIRUBINUR NEGATIVE 07/18/2021 1418   KETONESUR NEGATIVE 07/18/2021 1418   PROTEINUR NEGATIVE 05/23/2020 2342   UROBILINOGEN 0.2 07/18/2021 1418   NITRITE POSITIVE (A) 07/18/2021 1418   LEUKOCYTESUR TRACE (A) 07/18/2021 1418    Sepsis Labs: Lactic Acid, Venous No results found for: "LATICACIDVEN"  MICROBIOLOGY: Recent Results (from the past 240 hour(s))  Resp panel by RT-PCR (RSV, Flu A&B, Covid) Anterior Nasal Swab     Status: Abnormal   Collection Time: 04/20/22 11:09 PM   Specimen: Anterior Nasal Swab  Result Value Ref Range Status   SARS Coronavirus 2 by RT PCR NEGATIVE NEGATIVE Final    Comment: (NOTE) SARS-CoV-2 target nucleic acids are NOT DETECTED.  The SARS-CoV-2 RNA is generally detectable in upper respiratory specimens during the acute phase of infection. The lowest concentration of SARS-CoV-2 viral copies this assay can detect is 138 copies/mL. A negative result does not preclude SARS-Cov-2 infection and should not be used as the sole basis for treatment or other patient  management decisions. A negative result may occur with  improper specimen collection/handling, submission of specimen other than nasopharyngeal swab, presence of viral mutation(s) within the areas targeted by this assay, and inadequate number of viral copies(<138 copies/mL). A negative result must be combined with clinical observations, patient history, and epidemiological information. The expected result is Negative.  Fact Sheet for Patients:  EntrepreneurPulse.com.au  Fact Sheet for Healthcare Providers:  IncredibleEmployment.be  This test is no t yet approved or cleared by the [place] FDA and  has been authorized for detection and/or diagnosis of SARS-CoV-2 by FDA under an Emergency Use Authorization (EUA). This EUA will remain  in effect (meaning this test can be used) for the duration of the COVID-19 declaration under Section 564(b)(1) of the Act, 21 U.S.C.section 360bbb-3(b)(1), unless the authorization is terminated  or revoked sooner.       Influenza A by PCR NEGATIVE NEGATIVE Final   Influenza B by PCR NEGATIVE NEGATIVE Final    Comment: (NOTE) The Xpert Xpress SARS-CoV-2/FLU/RSV plus assay is intended as an aid in the diagnosis of influenza from Nasopharyngeal swab specimens and should not be used as a sole basis for treatment. Nasal washings and aspirates are unacceptable for Xpert Xpress SARS-CoV-2/FLU/RSV testing.  Fact Sheet for Patients: EntrepreneurPulse.com.au  Fact Sheet for Healthcare Providers: IncredibleEmployment.be  This test is not yet approved or cleared by the [place] FDA and has been authorized for detection and/or diagnosis of SARS-CoV-2 by FDA under an Emergency Use Authorization (EUA). This EUA will remain in effect (meaning this test can be used) for the duration of the COVID-19 declaration under Section 564(b)(1) of the Act, 21 U.S.C. section 360bbb-3(b)(1),  unless the authorization is terminated or revoked.     Resp Syncytial Virus by PCR POSITIVE (A) NEGATIVE Final    Comment: (NOTE) Fact Sheet for Patients: EntrepreneurPulse.com.au  Fact Sheet for Healthcare Providers: IncredibleEmployment.be  This test is not yet approved or cleared by the [place] FDA and has been authorized for detection and/or diagnosis of SARS-CoV-2 by FDA under an Emergency Use Authorization (EUA). This EUA will remain in effect (meaning this test can be used) for the duration of the COVID-19 declaration under Section 564(b)(1) of  the Act, 21 U.S.C. section 360bbb-3(b)(1), unless the authorization is terminated or revoked.  Performed at [facility], [place] [address], [place], [place] [address]     RADIOLOGY STUDIES/RESULTS: No results found.   LOS: 2 days   Oren Binet, MD  [facility]    To contact the attending provider between 7A-7P or the covering provider during after hours 7P-7A, please log into the web site [email or web address] and access using universal [place] password for that web site. If you do not have the password, please call the hospital operator.  04/23/2022, 11:57 AM

## 2022-04-23 NOTE — Progress Notes (Signed)
Physical Therapy Treatment Patient Details Name: Andrea Perry MRN: [id number] DOB: 09-May-1950 Today's Date: 04/23/2022   History of Present Illness 72 yo female admitted 1/1 with SOB and respiratory failure due to COPD exacerbation. PMHx: anxiety, COPD, HTN, HLD, DM, OSA, melanoma, afib, obesity    PT Comments    Patient seated in the recliner on arrival to room. She declined standing/ambulation due to urinary frequency. She reports walking earlier in the room. She was agreeable to LE exercises for strengthening. Encouraged exercises, energy conservation techniques, being OOB to chair for upright conditioning. Recommend to continue PT to maximize independence and decrease caregiver burden. Patient reports she has help at home if needed. HHPT recommended.    Recommendations for follow up therapy are one component of a multi-disciplinary discharge planning process, led by the attending physician.  Recommendations may be updated based on patient status, additional functional criteria and insurance authorization.  Follow Up Recommendations  Home health PT     Assistance Recommended at Discharge Intermittent Supervision/Assistance  Patient can return home with the following A little help with walking and/or transfers;A little help with bathing/dressing/bathroom;Assistance with cooking/housework;Direct supervision/assist for medications management;Assist for transportation;Help with stairs or ramp for entrance   Equipment Recommendations  Rollator (4 wheels)    Recommendations for Other Services       Precautions / Restrictions Precautions Precautions: Fall Restrictions Weight Bearing Restrictions: No     Mobility  Bed Mobility               General bed mobility comments: not assessed as patient sitting up in recliner chair on arrival and post session    Transfers                   General transfer comment: patient refused to stand/ambulate due to urinary frequency.  patient can reposition independently in the recliner chair.    Ambulation/Gait                   Stairs             Wheelchair Mobility    Modified Rankin (Stroke Patients Only)       Balance                                            Cognition Arousal/Alertness: Awake/alert Behavior During Therapy: WFL for tasks assessed/performed Overall Cognitive Status: Within Functional Limits for tasks assessed                                          Exercises General Exercises - Lower Extremity Ankle Circles/Pumps: AROM, Strengthening, Both, 15 reps, Seated Long Arc Quad: AROM, AAROM, Strengthening, Both, 10 reps, Seated Hip ABduction/ADduction: AAROM, Strengthening, Both, 10 reps, Supine Hip Flexion/Marching: AAROM, Strengthening, Both, 10 reps, Seated Other Exercises Other Exercises: verbal cues for exercise technique for strengthening    General Comments General comments (skin integrity, edema, etc.): educated patient on energy conservation techniques, pursed lip breathing, and encouragement provided for being up in chair to promote conditioning.      Pertinent Vitals/Pain Pain Assessment Pain Assessment: No/denies pain    Home Living  Prior Function            PT Goals (current goals can now be found in the care plan section) Acute Rehab PT Goals Patient Stated Goal: return home PT Goal Formulation: With patient Time For Goal Achievement: 05/05/22 Potential to Achieve Goals: Fair Progress towards PT goals: Progressing toward goals    Frequency    Min 3X/week      PT Plan Current plan remains appropriate    Co-evaluation              AM-PAC PT "6 Clicks" Mobility   Outcome Measure  Help needed turning from your back to your side while in a flat bed without using bedrails?: A Little Help needed moving from lying on your back to sitting on the side of a flat bed  without using bedrails?: A Little Help needed moving to and from a bed to a chair (including a wheelchair)?: A Little Help needed standing up from a chair using your arms (e.g., wheelchair or bedside chair)?: A Little Help needed to walk in hospital room?: A Little Help needed climbing 3-5 steps with a railing? : A Lot 6 Click Score: 17    End of Session Equipment Utilized During Treatment: Oxygen Activity Tolerance: Patient limited by fatigue Patient left: in chair;with call bell/phone within reach   PT Visit Diagnosis: Other abnormalities of gait and mobility (R26.89);Muscle weakness (generalized) (M62.81)     Time: 2111-5520 PT Time Calculation (min) (ACUTE ONLY): 10 min  Charges:  $Therapeutic Exercise: 8-22 mins                    [name], PT, MPT    [name] 04/23/2022, 2:05 PM

## 2022-04-24 DIAGNOSIS — K219 Gastro-esophageal reflux disease without esophagitis: Secondary | ICD-10-CM | POA: Diagnosis not present

## 2022-04-24 DIAGNOSIS — I4811 Longstanding persistent atrial fibrillation: Secondary | ICD-10-CM | POA: Diagnosis not present

## 2022-04-24 DIAGNOSIS — J441 Chronic obstructive pulmonary disease with (acute) exacerbation: Secondary | ICD-10-CM | POA: Diagnosis not present

## 2022-04-24 DIAGNOSIS — J9601 Acute respiratory failure with hypoxia: Secondary | ICD-10-CM | POA: Diagnosis not present

## 2022-04-24 LAB — BASIC METABOLIC PANEL
Anion gap: 11 (ref 5–15)
BUN: 22 mg/dL (ref 8–23)
CO2: 32 mmol/L (ref 22–32)
Calcium: 8 mg/dL — ABNORMAL LOW (ref 8.9–10.3)
Chloride: 92 mmol/L — ABNORMAL LOW (ref 98–111)
Creatinine, Ser: 1.06 mg/dL — ABNORMAL HIGH (ref 0.44–1.00)
GFR, Estimated: 57 mL/min — ABNORMAL LOW (ref >60)
Glucose, Bld: 240 mg/dL — ABNORMAL HIGH (ref 70–99)
Potassium: 3.7 mmol/L (ref 3.5–5.1)
Sodium: 135 mmol/L (ref 135–145)

## 2022-04-24 LAB — GLUCOSE, CAPILLARY
Glucose-Capillary: 215 mg/dL — ABNORMAL HIGH (ref 70–99)
Glucose-Capillary: 211 mg/dL — ABNORMAL HIGH (ref 70–99)
Glucose-Capillary: 253 mg/dL — ABNORMAL HIGH (ref 70–99)
Glucose-Capillary: 192 mg/dL — ABNORMAL HIGH (ref 70–99)
Glucose-Capillary: 236 mg/dL — ABNORMAL HIGH (ref 70–99)

## 2022-04-24 MED ORDER — IPRATROPIUM-ALBUTEROL 0.5-2.5 (3) MG/3ML IN SOLN
RESPIRATORY_TRACT | Status: AC
  Filled 2022-04-24: qty 3

## 2022-04-24 NOTE — Progress Notes (Signed)
PROGRESS NOTE        PATIENT DETAILS Name: Andrea Perry Age: 72 y.o. Sex: female Date of Birth: 16-Nov-1950 Admit Date: 04/20/2022 Admitting Physician Evalee Mutton Kristeen Mans, MD TMA:UQJF, Hunt Oris, MD  Brief Summary: Patient is a 72 y.o.  female with history of COPD, HTN, HLD, DM-2, OSA-not on CPAP, morbid obesity-who presented with approximately 3-day history of cough/shortness of breath-she was found to have COPD exacerbation in the setting of RSV infection and subsequently admitted to the hospitalist service.  Significant events: 01/01>> admit to [facility]-COPD exacerbation-RSV infection.  Significant studies: 01/01>> CXR: No PNA  Significant microbiology data: 01/01>> RSV PCR: Positive 01/01>> COVID/influenza PCR: Negative  Procedures: None  Consults: None  Subjective: Slowly improving-still continues to have episodes of bronchospasms.  Lower leg extremity with significant improvement in swelling.  Objective: Vitals: Blood pressure 111/69, pulse (!) 101, temperature 98.4 F (36.9 C), temperature source Oral, resp. rate 18, height '5\' 2"'$  (1.575 m), weight 121.6 kg, SpO2 97 %.   Exam: Gen Exam:Alert awake-not in any distress HEENT:atraumatic, normocephalic Chest: B/L clear to auscultation anteriorly CVS:S1S2 regular Abdomen:soft non tender, non distended Extremities:+edema Neurology: Non focal Skin: no rash  Pertinent Labs/Radiology:    Latest Ref Rng & Units 04/22/2022    4:45 AM 04/21/2022    4:17 AM 04/20/2022   11:14 PM  CBC  WBC 4.0 - 10.5 K/uL 16.7  9.8  8.8   Hemoglobin 12.0 - 15.0 g/dL 11.7  10.5  11.0   Hematocrit 36.0 - 46.0 % 35.7  34.2  35.4   Platelets 150 - 400 K/uL 304  259  258     Lab Results  Component Value Date   NA 135 04/24/2022   K 3.7 04/24/2022   CL 92 (L) 04/24/2022   CO2 32 04/24/2022      Assessment/Plan: Acute hypoxic respiratory failure COPD exacerbation due to RSV infection Slow improvement continues Still  wheezing but moving air well-continues to have episodes of bronchospasms. Continue steroids/bronchodilators Continue to attempt to titrate down FiO2.  Acute on chronic HFpEF Volume status better-improvement in lower extremity edema Continue IV Lasix for 1 additional day Follow electrolytes/weights/intake/output closely.    Chronic atrial fibrillation Rate controlled this morning Continue metoprolol-remains on Eliquis  HTN BP stable Continue Avapro/metoprolol/amlodipine  HLD Continue statin  DM-2 (A1c 7.0 on 10/18) with steroid-induced mild hyperglycemia CBG stable with SSI Resume oral hypoglycemics on discharge  Recent Labs    04/24/22 0621 04/24/22 0722 04/24/22 1110  GLUCAP 215* 211* 253*      Hypothyroidism Continue levothyroxine  Mood disorder Stable Continue Lexapro  Morbid Obesity: Estimated body mass index is 49.03 kg/m as calculated from the following:   Height as of this encounter: '5\' 2"'$  (1.575 m).   Weight as of this encounter: 121.6 kg.   Code status:   Code Status: Full Code   DVT Prophylaxis: SCDs Start: 04/21/22 0327 apixaban (ELIQUIS) tablet 5 mg    Family Communication: None at bedside   Disposition Plan: Status is: Observation The patient will require care spanning > 2 midnights and should be moved to inpatient because: Severity of illness.   Planned Discharge Destination:Home hopefully over the weekend if clinical improvement continues.   Diet: Diet Order             Diet Heart Room service appropriate? Yes; Fluid consistency: Thin;  Fluid restriction: 1500 mL Fluid  Diet effective now                     Antimicrobial agents: Anti-infectives (From admission, onward)    Start     Dose/Rate Route Frequency Ordered Stop   04/22/22 0330  azithromycin (ZITHROMAX) tablet 500 mg       See Hyperspace for full Linked Orders Report.   500 mg Oral Daily 04/21/22 0329 04/26/22 0959   04/21/22 0330  azithromycin (ZITHROMAX) 500  mg in sodium chloride 0.9 % 250 mL IVPB       See Hyperspace for full Linked Orders Report.   500 mg 250 mL/hr over 60 Minutes Intravenous Every 24 hours 04/21/22 0329 04/21/22 0552        MEDICATIONS: Scheduled Meds:  amLODipine  10 mg Oral Daily   apixaban  5 mg Oral BID   azithromycin  500 mg Oral Daily   benzonatate  100 mg Oral TID   budesonide (PULMICORT) nebulizer solution  0.25 mg Nebulization BID   escitalopram  20 mg Oral Daily   fesoterodine  4 mg Oral Daily   furosemide  40 mg Intravenous BID   insulin aspart  0-15 Units Subcutaneous TID WC   insulin aspart  0-5 Units Subcutaneous QHS   ipratropium-albuterol  3 mL Nebulization QID   irbesartan  150 mg Oral Daily   levothyroxine  50 mcg Oral Daily   methylPREDNISolone (SOLU-MEDROL) injection  40 mg Intravenous Q12H   metoprolol tartrate  100 mg Oral BID   pantoprazole  40 mg Oral Daily   rosuvastatin  40 mg Oral Daily   Continuous Infusions: PRN Meds:.acetaminophen, albuterol, guaiFENesin, lip balm, LORazepam   I have personally reviewed following labs and imaging studies  LABORATORY DATA: CBC: Recent Labs  Lab 04/20/22 2314 04/21/22 0417 04/22/22 0445  WBC 8.8 9.8 16.7*  NEUTROABS 6.7  --   --   HGB 11.0* 10.5* 11.7*  HCT 35.4* 34.2* 35.7*  MCV 89.6 88.8 85.2  PLT 258 259 304     Basic Metabolic Panel: Recent Labs  Lab 04/20/22 2314 04/21/22 0417 04/22/22 0445 04/23/22 0327 04/24/22 0711  NA 139  --  136 137 135  K 4.4  --  4.0 3.6 3.7  CL 102  --  97* 94* 92*  CO2 24  --  28 31 32  GLUCOSE 151*  --  173* 117* 240*  BUN 11  --  '16 20 22  '$ CREATININE 1.00 0.94 1.10* 1.15* 1.06*  CALCIUM 8.6*  --  8.9 8.2* 8.0*     GFR: Estimated Creatinine Clearance: 62.2 mL/min (A) (by C-G formula based on SCr of 1.06 mg/dL (H)).  Liver Function Tests: Recent Labs  Lab 04/20/22 2314  AST 24  ALT 13  ALKPHOS 75  BILITOT 0.6  PROT 6.4*  ALBUMIN 3.1*    No results for input(s): "LIPASE",  "AMYLASE" in the last 168 hours. No results for input(s): "AMMONIA" in the last 168 hours.  Coagulation Profile: Recent Labs  Lab 04/20/22 2314  INR 1.2     Cardiac Enzymes: No results for input(s): "CKTOTAL", "CKMB", "CKMBINDEX", "TROPONINI" in the last 168 hours.  BNP (last 3 results) No results for input(s): "PROBNP" in the last 8760 hours.  Lipid Profile: No results for input(s): "CHOL", "HDL", "LDLCALC", "TRIG", "CHOLHDL", "LDLDIRECT" in the last 72 hours.  Thyroid Function Tests: No results for input(s): "TSH", "T4TOTAL", "FREET4", "T3FREE", "THYROIDAB" in the last 72 hours.  Anemia Panel: No results for input(s): "VITAMINB12", "FOLATE", "FERRITIN", "TIBC", "IRON", "RETICCTPCT" in the last 72 hours.  Urine analysis:    Component Value Date/Time   COLORURINE YELLOW 07/18/2021 1418   APPEARANCEUR CLOUDY (A) 07/18/2021 1418   LABSPEC 1.015 07/18/2021 1418   PHURINE 5.5 07/18/2021 1418   GLUCOSEU NEGATIVE 07/18/2021 1418   HGBUR NEGATIVE 07/18/2021 1418   BILIRUBINUR NEGATIVE 07/18/2021 1418   KETONESUR NEGATIVE 07/18/2021 1418   PROTEINUR NEGATIVE 05/23/2020 2342   UROBILINOGEN 0.2 07/18/2021 1418   NITRITE POSITIVE (A) 07/18/2021 1418   LEUKOCYTESUR TRACE (A) 07/18/2021 1418    Sepsis Labs: Lactic Acid, Venous No results found for: "LATICACIDVEN"  MICROBIOLOGY: Recent Results (from the past 240 hour(s))  Resp panel by RT-PCR (RSV, Flu A&B, Covid) Anterior Nasal Swab     Status: Abnormal   Collection Time: 04/20/22 11:09 PM   Specimen: Anterior Nasal Swab  Result Value Ref Range Status   SARS Coronavirus 2 by RT PCR NEGATIVE NEGATIVE Final    Comment: (NOTE) SARS-CoV-2 target nucleic acids are NOT DETECTED.  The SARS-CoV-2 RNA is generally detectable in upper respiratory specimens during the acute phase of infection. The lowest concentration of SARS-CoV-2 viral copies this assay can detect is 138 copies/mL. A negative result does not preclude  SARS-Cov-2 infection and should not be used as the sole basis for treatment or other patient management decisions. A negative result may occur with  improper specimen collection/handling, submission of specimen other than nasopharyngeal swab, presence of viral mutation(s) within the areas targeted by this assay, and inadequate number of viral copies(<138 copies/mL). A negative result must be combined with clinical observations, patient history, and epidemiological information. The expected result is Negative.  Fact Sheet for Patients:  EntrepreneurPulse.com.au  Fact Sheet for Healthcare Providers:  IncredibleEmployment.be  This test is no t yet approved or cleared by the [place] FDA and  has been authorized for detection and/or diagnosis of SARS-CoV-2 by FDA under an Emergency Use Authorization (EUA). This EUA will remain  in effect (meaning this test can be used) for the duration of the COVID-19 declaration under Section 564(b)(1) of the Act, 21 U.S.C.section 360bbb-3(b)(1), unless the authorization is terminated  or revoked sooner.       Influenza A by PCR NEGATIVE NEGATIVE Final   Influenza B by PCR NEGATIVE NEGATIVE Final    Comment: (NOTE) The Xpert Xpress SARS-CoV-2/FLU/RSV plus assay is intended as an aid in the diagnosis of influenza from Nasopharyngeal swab specimens and should not be used as a sole basis for treatment. Nasal washings and aspirates are unacceptable for Xpert Xpress SARS-CoV-2/FLU/RSV testing.  Fact Sheet for Patients: EntrepreneurPulse.com.au  Fact Sheet for Healthcare Providers: IncredibleEmployment.be  This test is not yet approved or cleared by the [place] FDA and has been authorized for detection and/or diagnosis of SARS-CoV-2 by FDA under an Emergency Use Authorization (EUA). This EUA will remain in effect (meaning this test can be used) for the duration of  the COVID-19 declaration under Section 564(b)(1) of the Act, 21 U.S.C. section 360bbb-3(b)(1), unless the authorization is terminated or revoked.     Resp Syncytial Virus by PCR POSITIVE (A) NEGATIVE Final    Comment: (NOTE) Fact Sheet for Patients: EntrepreneurPulse.com.au  Fact Sheet for Healthcare Providers: IncredibleEmployment.be  This test is not yet approved or cleared by the [place] FDA and has been authorized for detection and/or diagnosis of SARS-CoV-2 by FDA under an Emergency Use Authorization (EUA). This EUA will remain in effect (meaning  this test can be used) for the duration of the COVID-19 declaration under Section 564(b)(1) of the Act, 21 U.S.C. section 360bbb-3(b)(1), unless the authorization is terminated or revoked.  Performed at [facility], [place] [address] [place], [place] [address]     RADIOLOGY STUDIES/RESULTS: No results found.   LOS: 3 days   Oren Binet, MD  [facility]    To contact the attending provider between 7A-7P or the covering provider during after hours 7P-7A, please log into the web site [email or web address] and access using universal [employer] password for that web site. If you do not have the password, please call the hospital operator.  04/24/2022, 11:54 AM

## 2022-04-24 NOTE — Inpatient Diabetes Management (Signed)
Inpatient Diabetes Program Recommendations  AACE/ADA: New Consensus Statement on Inpatient Glycemic Control (2015)  Target Ranges:  Prepandial:   less than 140 mg/dL      Peak postprandial:   less than 180 mg/dL (1-2 hours)      Critically ill patients:  140 - 180 mg/dL   Lab Results  Component Value Date   GLUCAP 211 (H) 04/24/2022   HGBA1C 7.0 (H) 02/04/2022    Review of Glycemic Control  Latest Reference Range & Units 04/23/22 07:16 04/23/22 11:16 04/23/22 16:44 04/23/22 20:59 04/24/22 06:21 04/24/22 07:22  Glucose-Capillary 70 - 99 mg/dL 162 (H)  Novolog 3 units 203 (H)  Novolog 5 units 278 (H)  Novolog 8 units 239 (H)  Novolog 2 units 215 (H) 211 (H)  Novolog 5 units   Diabetes history: DM 2 Outpatient Diabetes medications: None Current orders for Inpatient glycemic control:  Novolog 0-15 units tid + hs  Solumedrol 40 mg Q12 hours A1c 7% on 10/18  Inpatient Diabetes Program Recommendations:    -   Add Novolog 4 units tid meal coverage if eating >50% of meals  Thanks,  Tama Headings RN, MSN, BC-ADM Inpatient Diabetes Coordinator Team Pager [phone] (8a-5p)

## 2022-04-24 NOTE — Plan of Care (Signed)
  Problem: Education: Goal: Individualized Educational Video(s) Outcome: Progressing   Problem: Coping: Goal: Ability to adjust to condition or change in health will improve Outcome: Progressing   Problem: Fluid Volume: Goal: Ability to maintain a balanced intake and output will improve Outcome: Progressing   Problem: Health Behavior/Discharge Planning: Goal: Ability to identify and utilize available resources and services will improve Outcome: Progressing Goal: Ability to manage health-related needs will improve Outcome: Progressing   Problem: Metabolic: Goal: Ability to maintain appropriate glucose levels will improve Outcome: Progressing   Problem: Nutritional: Goal: Maintenance of adequate nutrition will improve Outcome: Progressing Goal: Progress toward achieving an optimal weight will improve Outcome: Progressing   Problem: Skin Integrity: Goal: Risk for impaired skin integrity will decrease Outcome: Progressing   Problem: Tissue Perfusion: Goal: Adequacy of tissue perfusion will improve Outcome: Progressing   Problem: Education: Goal: Knowledge of General Education information will improve Description: Including pain rating scale, medication(s)/side effects and non-pharmacologic comfort measures Outcome: Progressing   Problem: Health Behavior/Discharge Planning: Goal: Ability to manage health-related needs will improve Outcome: Progressing   Problem: Clinical Measurements: Goal: Ability to maintain clinical measurements within normal limits will improve Outcome: Progressing Goal: Will remain free from infection Outcome: Progressing Goal: Diagnostic test results will improve Outcome: Progressing Goal: Respiratory complications will improve Outcome: Progressing Goal: Cardiovascular complication will be avoided Outcome: Progressing   Problem: Activity: Goal: Risk for activity intolerance will decrease Outcome: Progressing   Problem: Nutrition: Goal:  Adequate nutrition will be maintained Outcome: Progressing   Problem: Coping: Goal: Level of anxiety will decrease Outcome: Progressing   Problem: Elimination: Goal: Will not experience complications related to bowel motility Outcome: Progressing Goal: Will not experience complications related to urinary retention Outcome: Progressing   Problem: Pain Managment: Goal: General experience of comfort will improve Outcome: Progressing   Problem: Safety: Goal: Ability to remain free from injury will improve Outcome: Progressing   Problem: Skin Integrity: Goal: Risk for impaired skin integrity will decrease Outcome: Progressing

## 2022-04-25 DIAGNOSIS — J441 Chronic obstructive pulmonary disease with (acute) exacerbation: Secondary | ICD-10-CM | POA: Diagnosis not present

## 2022-04-25 DIAGNOSIS — I4811 Longstanding persistent atrial fibrillation: Secondary | ICD-10-CM | POA: Diagnosis not present

## 2022-04-25 DIAGNOSIS — J9601 Acute respiratory failure with hypoxia: Secondary | ICD-10-CM | POA: Diagnosis not present

## 2022-04-25 DIAGNOSIS — K219 Gastro-esophageal reflux disease without esophagitis: Secondary | ICD-10-CM | POA: Diagnosis not present

## 2022-04-25 LAB — GLUCOSE, CAPILLARY
Glucose-Capillary: 254 mg/dL — ABNORMAL HIGH (ref 70–99)
Glucose-Capillary: 209 mg/dL — ABNORMAL HIGH (ref 70–99)
Glucose-Capillary: 197 mg/dL — ABNORMAL HIGH (ref 70–99)
Glucose-Capillary: 205 mg/dL — ABNORMAL HIGH (ref 70–99)

## 2022-04-25 LAB — BASIC METABOLIC PANEL
Anion gap: 17 — ABNORMAL HIGH (ref 5–15)
BUN: 28 mg/dL — ABNORMAL HIGH (ref 8–23)
CO2: 30 mmol/L (ref 22–32)
Calcium: 8.3 mg/dL — ABNORMAL LOW (ref 8.9–10.3)
Chloride: 86 mmol/L — ABNORMAL LOW (ref 98–111)
Creatinine, Ser: 1.14 mg/dL — ABNORMAL HIGH (ref 0.44–1.00)
GFR, Estimated: 52 mL/min — ABNORMAL LOW (ref >60)
Glucose, Bld: 277 mg/dL — ABNORMAL HIGH (ref 70–99)
Potassium: 3.5 mmol/L (ref 3.5–5.1)
Sodium: 133 mmol/L — ABNORMAL LOW (ref 135–145)

## 2022-04-25 MED ORDER — LORATADINE 10 MG PO TABS
10.0000 mg | ORAL_TABLET | Freq: Every day | ORAL | Status: DC
  Administered 2022-04-25 – 2022-04-26 (×2): 10 mg via ORAL
  Filled 2022-04-25 (×2): qty 1

## 2022-04-25 MED ORDER — IPRATROPIUM-ALBUTEROL 0.5-2.5 (3) MG/3ML IN SOLN
3.0000 mL | Freq: Three times a day (TID) | RESPIRATORY_TRACT | Status: DC
  Administered 2022-04-25: 3 mL via RESPIRATORY_TRACT
  Filled 2022-04-25: qty 3

## 2022-04-25 MED ORDER — MOMETASONE FURO-FORMOTEROL FUM 100-5 MCG/ACT IN AERO
2.0000 | INHALATION_SPRAY | Freq: Two times a day (BID) | RESPIRATORY_TRACT | Status: DC
  Administered 2022-04-25 – 2022-04-26 (×3): 2 via RESPIRATORY_TRACT
  Filled 2022-04-25: qty 8.8

## 2022-04-25 MED ORDER — FUROSEMIDE 40 MG PO TABS
40.0000 mg | ORAL_TABLET | Freq: Two times a day (BID) | ORAL | Status: DC
  Administered 2022-04-25 – 2022-04-26 (×3): 40 mg via ORAL
  Filled 2022-04-25 (×3): qty 1

## 2022-04-25 MED ORDER — UMECLIDINIUM BROMIDE 62.5 MCG/ACT IN AEPB
1.0000 | INHALATION_SPRAY | Freq: Every day | RESPIRATORY_TRACT | Status: DC
  Administered 2022-04-25 – 2022-04-26 (×2): 1 via RESPIRATORY_TRACT
  Filled 2022-04-25: qty 7

## 2022-04-25 MED ORDER — BUDESON-GLYCOPYRROL-FORMOTEROL 160-9-4.8 MCG/ACT IN AERO: 2.0000 | INHALATION_SPRAY | Freq: Two times a day (BID) | RESPIRATORY_TRACT | Status: DC

## 2022-04-25 NOTE — Progress Notes (Signed)
PROGRESS NOTE        PATIENT DETAILS Name: Andrea Perry Age: 72 y.o. Sex: female Date of Birth: 28-Sep-1950 Admit Date: 04/20/2022 Admitting Physician Evalee Mutton Kristeen Mans, MD TKP:[id number], Hunt Oris, MD  Brief Summary: Patient is a 72 y.o.  female with history of COPD, HTN, HLD, DM-2, OSA-not on CPAP, morbid obesity-who presented with approximately 3-day history of cough/shortness of breath-she was found to have COPD exacerbation in the setting of RSV infection and subsequently admitted to the hospitalist service.  Significant events: 01/01>> admit to [facility]-COPD exacerbation-RSV infection.  Significant studies: 01/01>> CXR: No PNA  Significant microbiology data: 01/01>> RSV PCR: Positive 01/01>> COVID/influenza PCR: Negative  Procedures: None  Consults: None  Subjective: Had another episode of bronchospasm last night.  Slowly improving.  Acknowledges that she is much better than how she first presented.  Significant improvement in lower extremity edema.  Objective: Vitals: Blood pressure 104/62, pulse 88, temperature 98.4 F (36.9 C), temperature source Oral, resp. rate 19, height '5\' 2"'$  (1.575 m), weight 121.6 kg, SpO2 97 %.   Exam: Gen Exam:Alert awake-not in any distress HEENT:atraumatic, normocephalic Chest: B/L clear to auscultation anteriorly CVS:S1S2 regular Abdomen:soft non tender, non distended Extremities:trace edema Neurology: Non focal Skin: no rash  Pertinent Labs/Radiology:    Latest Ref Rng & Units 04/22/2022    4:45 AM 04/21/2022    4:17 AM 04/20/2022   11:14 PM  CBC  WBC 4.0 - 10.5 K/uL 16.7  9.8  8.8   Hemoglobin 12.0 - 15.0 g/dL 11.7  10.5  11.0   Hematocrit 36.0 - 46.0 % 35.7  34.2  35.4   Platelets 150 - 400 K/uL 304  259  258     Lab Results  Component Value Date   NA 135 04/24/2022   K 3.7 04/24/2022   CL 92 (L) 04/24/2022   CO2 32 04/24/2022      Assessment/Plan: Acute hypoxic respiratory failure COPD exacerbation  due to RSV infection Much improved  Minimal scattered rhonchi today  Transition to oral prednisone and oral diuretics today Back her usual inhaler regimen Titrate down FiO2-assess for home O2 requirement  Acute on chronic HFpEF Significant improvement in volume status-suspect she is close to being euvolemic Transition to oral diuretics today.   Follow electrolytes/weights/intake/output closely.    Chronic atrial fibrillation Rate controlled this morning Continue metoprolol-remains on Eliquis  HTN BP stable Continue Avapro/metoprolol/amlodipine  HLD Continue statin  DM-2 (A1c 7.0 on 10/18) with steroid-induced mild hyperglycemia CBG stable with SSI Resume oral hypoglycemics on discharge  Recent Labs    04/24/22 1558 04/24/22 2111 04/25/22 0730  GLUCAP 192* 236* 254*      Hypothyroidism Continue levothyroxine  Mood disorder Stable Continue Lexapro  Morbid Obesity: Estimated body mass index is 49.03 kg/m as calculated from the following:   Height as of this encounter: '5\' 2"'$  (1.575 m).   Weight as of this encounter: 121.6 kg.   Code status:   Code Status: Full Code   DVT Prophylaxis: SCDs Start: 04/21/22 0327 apixaban (ELIQUIS) tablet 5 mg    Family Communication: None at bedside   Disposition Plan: Status is: Observation The patient will require care spanning > 2 midnights and should be moved to inpatient because: Severity of illness.   Planned Discharge Destination:Home hopefully over the weekend if clinical improvement continues.   Diet: Diet Order  Diet Heart Room service appropriate? Yes; Fluid consistency: Thin; Fluid restriction: 1500 mL Fluid  Diet effective now                     Antimicrobial agents: Anti-infectives (From admission, onward)    Start     Dose/Rate Route Frequency Ordered Stop   04/22/22 0330  azithromycin (ZITHROMAX) tablet 500 mg       See Hyperspace for full Linked Orders Report.   500 mg Oral  Daily 04/21/22 0329 04/25/22 0806   04/21/22 0330  azithromycin (ZITHROMAX) 500 mg in sodium chloride 0.9 % 250 mL IVPB       See Hyperspace for full Linked Orders Report.   500 mg 250 mL/hr over 60 Minutes Intravenous Every 24 hours 04/21/22 0329 04/21/22 0552        MEDICATIONS: Scheduled Meds:  amLODipine  10 mg Oral Daily   apixaban  5 mg Oral BID   benzonatate  100 mg Oral TID   escitalopram  20 mg Oral Daily   fesoterodine  4 mg Oral Daily   furosemide  40 mg Oral BID   insulin aspart  0-15 Units Subcutaneous TID WC   insulin aspart  0-5 Units Subcutaneous QHS   ipratropium-albuterol  3 mL Nebulization QID   irbesartan  150 mg Oral Daily   levothyroxine  50 mcg Oral Daily   loratadine  10 mg Oral Daily   metoprolol tartrate  100 mg Oral BID   mometasone-formoterol  2 puff Inhalation BID   And   umeclidinium bromide  1 puff Inhalation Daily   pantoprazole  40 mg Oral Daily   rosuvastatin  40 mg Oral Daily   Continuous Infusions: PRN Meds:.acetaminophen, albuterol, guaiFENesin, lip balm, LORazepam   I have personally reviewed following labs and imaging studies  LABORATORY DATA: CBC: Recent Labs  Lab 04/20/22 2314 04/21/22 0417 04/22/22 0445  WBC 8.8 9.8 16.7*  NEUTROABS 6.7  --   --   HGB 11.0* 10.5* 11.7*  HCT 35.4* 34.2* 35.7*  MCV 89.6 88.8 85.2  PLT 258 259 304     Basic Metabolic Panel: Recent Labs  Lab 04/20/22 2314 04/21/22 0417 04/22/22 0445 04/23/22 0327 04/24/22 0711  NA 139  --  136 137 135  K 4.4  --  4.0 3.6 3.7  CL 102  --  97* 94* 92*  CO2 24  --  28 31 32  GLUCOSE 151*  --  173* 117* 240*  BUN 11  --  '16 20 22  '$ CREATININE 1.00 0.94 1.10* 1.15* 1.06*  CALCIUM 8.6*  --  8.9 8.2* 8.0*     GFR: Estimated Creatinine Clearance: 62.2 mL/min (A) (by C-G formula based on SCr of 1.06 mg/dL (H)).  Liver Function Tests: Recent Labs  Lab 04/20/22 2314  AST 24  ALT 13  ALKPHOS 75  BILITOT 0.6  PROT 6.4*  ALBUMIN 3.1*    No  results for input(s): "LIPASE", "AMYLASE" in the last 168 hours. No results for input(s): "AMMONIA" in the last 168 hours.  Coagulation Profile: Recent Labs  Lab 04/20/22 2314  INR 1.2     Cardiac Enzymes: No results for input(s): "CKTOTAL", "CKMB", "CKMBINDEX", "TROPONINI" in the last 168 hours.  BNP (last 3 results) No results for input(s): "PROBNP" in the last 8760 hours.  Lipid Profile: No results for input(s): "CHOL", "HDL", "LDLCALC", "TRIG", "CHOLHDL", "LDLDIRECT" in the last 72 hours.  Thyroid Function Tests: No results for input(s): "TSH", "T4TOTAL", "  FREET4", "T3FREE", "THYROIDAB" in the last 72 hours.  Anemia Panel: No results for input(s): "VITAMINB12", "FOLATE", "FERRITIN", "TIBC", "IRON", "RETICCTPCT" in the last 72 hours.  Urine analysis:    Component Value Date/Time   COLORURINE YELLOW 07/18/2021 1418   APPEARANCEUR CLOUDY (A) 07/18/2021 1418   LABSPEC 1.015 07/18/2021 1418   PHURINE 5.5 07/18/2021 1418   GLUCOSEU NEGATIVE 07/18/2021 1418   HGBUR NEGATIVE 07/18/2021 1418   BILIRUBINUR NEGATIVE 07/18/2021 1418   KETONESUR NEGATIVE 07/18/2021 1418   PROTEINUR NEGATIVE 05/23/2020 2342   UROBILINOGEN 0.2 07/18/2021 1418   NITRITE POSITIVE (A) 07/18/2021 1418   LEUKOCYTESUR TRACE (A) 07/18/2021 1418    Sepsis Labs: Lactic Acid, Venous No results found for: "LATICACIDVEN"  MICROBIOLOGY: Recent Results (from the past 240 hour(s))  Resp panel by RT-PCR (RSV, Flu A&B, Covid) Anterior Nasal Swab     Status: Abnormal   Collection Time: 04/20/22 11:09 PM   Specimen: Anterior Nasal Swab  Result Value Ref Range Status   SARS Coronavirus 2 by RT PCR NEGATIVE NEGATIVE Final    Comment: (NOTE) SARS-CoV-2 target nucleic acids are NOT DETECTED.  The SARS-CoV-2 RNA is generally detectable in upper respiratory specimens during the acute phase of infection. The lowest concentration of SARS-CoV-2 viral copies this assay can detect is 138 copies/mL. A negative  result does not preclude SARS-Cov-2 infection and should not be used as the sole basis for treatment or other patient management decisions. A negative result may occur with  improper specimen collection/handling, submission of specimen other than nasopharyngeal swab, presence of viral mutation(s) within the areas targeted by this assay, and inadequate number of viral copies(<138 copies/mL). A negative result must be combined with clinical observations, patient history, and epidemiological information. The expected result is Negative.  Fact Sheet for Patients:  EntrepreneurPulse.com.au  Fact Sheet for Healthcare Providers:  IncredibleEmployment.be  This test is no t yet approved or cleared by the [place] FDA and  has been authorized for detection and/or diagnosis of SARS-CoV-2 by FDA under an Emergency Use Authorization (EUA). This EUA will remain  in effect (meaning this test can be used) for the duration of the COVID-19 declaration under Section 564(b)(1) of the Act, 21 U.S.C.section 360bbb-3(b)(1), unless the authorization is terminated  or revoked sooner.       Influenza A by PCR NEGATIVE NEGATIVE Final   Influenza B by PCR NEGATIVE NEGATIVE Final    Comment: (NOTE) The Xpert Xpress SARS-CoV-2/FLU/RSV plus assay is intended as an aid in the diagnosis of influenza from Nasopharyngeal swab specimens and should not be used as a sole basis for treatment. Nasal washings and aspirates are unacceptable for Xpert Xpress SARS-CoV-2/FLU/RSV testing.  Fact Sheet for Patients: EntrepreneurPulse.com.au  Fact Sheet for Healthcare Providers: IncredibleEmployment.be  This test is not yet approved or cleared by the [place] FDA and has been authorized for detection and/or diagnosis of SARS-CoV-2 by FDA under an Emergency Use Authorization (EUA). This EUA will remain in effect (meaning this test can be used)  for the duration of the COVID-19 declaration under Section 564(b)(1) of the Act, 21 U.S.C. section 360bbb-3(b)(1), unless the authorization is terminated or revoked.     Resp Syncytial Virus by PCR POSITIVE (A) NEGATIVE Final    Comment: (NOTE) Fact Sheet for Patients: EntrepreneurPulse.com.au  Fact Sheet for Healthcare Providers: IncredibleEmployment.be  This test is not yet approved or cleared by the [place] FDA and has been authorized for detection and/or diagnosis of SARS-CoV-2 by FDA under an Emergency Use  Authorization (EUA). This EUA will remain in effect (meaning this test can be used) for the duration of the COVID-19 declaration under Section 564(b)(1) of the Act, 21 U.S.C. section 360bbb-3(b)(1), unless the authorization is terminated or revoked.  Performed at [facility], [place] [address], [place], [place] [address]     RADIOLOGY STUDIES/RESULTS: No results found.   LOS: 4 days   Oren Binet, MD  [facility]    To contact the attending provider between 7A-7P or the covering provider during after hours 7P-7A, please log into the web site [email or web address] and access using universal [place] password for that web site. If you do not have the password, please call the hospital operator.  04/25/2022, 10:59 AM

## 2022-04-25 NOTE — Progress Notes (Signed)
SATURATION QUALIFICATIONS: (This note is used to comply with regulatory documentation for home oxygen)  Patient Saturations on Room Air at Rest = 93%  Patient Saturations on Room Air while Ambulating = 91%  Patient Saturations on 2 Liters of oxygen while Ambulating = 95%  Please briefly explain why patient needs home oxygen:  2 minutes of ambulation in hallway with walker assist.

## 2022-04-26 DIAGNOSIS — E039 Hypothyroidism, unspecified: Secondary | ICD-10-CM | POA: Diagnosis not present

## 2022-04-26 DIAGNOSIS — I4811 Longstanding persistent atrial fibrillation: Secondary | ICD-10-CM | POA: Diagnosis not present

## 2022-04-26 DIAGNOSIS — J441 Chronic obstructive pulmonary disease with (acute) exacerbation: Secondary | ICD-10-CM | POA: Diagnosis not present

## 2022-04-26 DIAGNOSIS — J9601 Acute respiratory failure with hypoxia: Secondary | ICD-10-CM | POA: Diagnosis not present

## 2022-04-26 LAB — GLUCOSE, CAPILLARY
Glucose-Capillary: 169 mg/dL — ABNORMAL HIGH (ref 70–99)
Glucose-Capillary: 180 mg/dL — ABNORMAL HIGH (ref 70–99)
Glucose-Capillary: 176 mg/dL — ABNORMAL HIGH (ref 70–99)

## 2022-04-26 MED ORDER — PREDNISONE 10 MG PO TABS: ORAL_TABLET | ORAL | 0 refills | Status: DC

## 2022-04-26 NOTE — Discharge Summary (Signed)
PATIENT DETAILS Name: Andrea Perry Age: 72 y.o. Sex: female Date of Birth: 06-23-1950 MRN: [id number]. Admitting Physician: Jonetta Osgood, MD [id number], Hunt Oris, MD  Admit Date: 04/20/2022 Discharge date: 04/26/2022  Recommendations for Outpatient Follow-up:  Follow up with PCP in 1-2 weeks Please obtain CMP/CBC in one week  Admitted From:  Home  Disposition: Home health   Discharge Condition: fair  CODE STATUS:   Code Status: Full Code   Diet recommendation:  Diet Order             Diet - low sodium heart healthy           Diet Carb Modified           Diet Heart Room service appropriate? Yes; Fluid consistency: Thin; Fluid restriction: 1500 mL Fluid  Diet effective now                    Brief Summary: Patient is a 72 y.o.  female with history of COPD, HTN, HLD, DM-2, OSA-not on CPAP, morbid obesity-who presented with approximately 3-day history of cough/shortness of breath-she was found to have COPD exacerbation in the setting of RSV infection and subsequently admitted to the hospitalist service.   Significant events: 01/01>> admit to [facility]-COPD exacerbation-RSV infection.   Significant studies: 01/01>> CXR: No PNA   Significant microbiology data: 01/01>> RSV PCR: Positive 01/01>> COVID/influenza PCR: Negative   Procedures: None   Consults: None  Brief Hospital Course: Acute hypoxic respiratory failure COPD exacerbation due to RSV infection Significantly better-treated with steroids/diuretics/bronchodilators  Slowly improved-now back to baseline Has been transitioned to her oral prednisone/oral diuretic regimen on 1/6-she remains on bronchodilators. Since she is significantly better and back to her baseline-she is stable for discharge today.  She will continue her inhaler regimen and use nebulized bronchodilators as rescue (at baseline uses it around 3 times a day)   Acute on chronic HFpEF Treated with IV Lasix-significant improvement in  close to being euvolemic. Transition to oral diuretics in 1/6-volume status remains stable.    Chronic atrial fibrillation Rate controlled this morning Continue metoprolol-remains on Eliquis   HTN BP stable Continue Avapro/metoprolol/amlodipine   HLD Continue statin   DM-2 (A1c 7.0 on 10/18) with steroid-induced mild hyperglycemia CBG stable with SSI Resume oral hypoglycemics on discharge  Hypothyroidism Continue levothyroxine   Mood disorder Stable Continue Lexapro   Morbid Obesity: Estimated body mass index is 49.03 kg/m as calculated from the following:   Height as of this encounter: '5\' 2"'$  (1.575 m).   Weight as of this encounter: 121.6 kg.    Discharge Diagnoses:  Principal Problem:   Acute respiratory failure with hypoxemia (HCC) Active Problems:   Hypothyroidism   Elevated lipids   Essential hypertension   IBS   Diabetes ([place])   COPD with acute exacerbation (HCC)   A-fib (HCC)   Mild obstructive sleep apnea   GERD (gastroesophageal reflux disease)   CHF (congestive heart failure) (HCC)   RSV (respiratory syncytial virus pneumonia)   Anxiety and depression   Acute respiratory failure (HCC)   COPD exacerbation ([place])   Discharge Instructions:  Activity:  As tolerated with Full fall precautions use walker/cane & assistance as needed  Discharge Instructions     Call MD for:  difficulty breathing, headache or visual disturbances   Complete by: As directed    Diet - low sodium heart healthy   Complete by: As directed    Diet Carb Modified   Complete by:  As directed    Discharge instructions   Complete by: As directed    Follow with Primary MD  Biagio Borg, MD in 1-2 weeks  Please get a complete blood count and chemistry panel checked by your Primary MD at your next visit, and again as instructed by your Primary MD.  Get Medicines reviewed and adjusted: Please take all your medications with you for your next visit with your Primary  MD  Laboratory/radiological data: Please request your Primary MD to go over all hospital tests and procedure/radiological results at the follow up, please ask your Primary MD to get all Hospital records sent to his/her office.  In some cases, they will be blood work, cultures and biopsy results pending at the time of your discharge. Please request that your primary care M.D. follows up on these results.  Also Note the following: If you experience worsening of your admission symptoms, develop shortness of breath, life threatening emergency, suicidal or homicidal thoughts you must seek medical attention immediately by calling 911 or calling your MD immediately  if symptoms less severe.  You must read complete instructions/literature along with all the possible adverse reactions/side effects for all the Medicines you take and that have been prescribed to you. Take any new Medicines after you have completely understood and accpet all the possible adverse reactions/side effects.   Do not drive when taking Pain medications or sleeping medications (Benzodaizepines)  Do not take more than prescribed Pain, Sleep and Anxiety Medications. It is not advisable to combine anxiety,sleep and pain medications without talking with your primary care practitioner  Special Instructions: If you have smoked or chewed Tobacco  in the last 2 yrs please stop smoking, stop any regular Alcohol  and or any Recreational drug use.  Wear Seat belts while driving.  Please note: You were cared for by a hospitalist during your hospital stay. Once you are discharged, your primary care physician will handle any further medical issues. Please note that NO REFILLS for any discharge medications will be authorized once you are discharged, as it is imperative that you return to your primary care physician (or establish a relationship with a primary care physician if you do not have one) for your post hospital discharge needs so that  they can reassess your need for medications and monitor your lab values.   Increase activity slowly   Complete by: As directed       Allergies as of 04/26/2022       Reactions   Tramadol Hcl Nausea And Vomiting, Other (See Comments)    mouth dryness, headache   Other Nausea Only   Codeine Nausea And Vomiting, Nausea Only   Tape Rash   Plastic tape, bandaids and ekg leads, causes redness and rash Other reaction(s): Not available   Vicodin [hydrocodone-acetaminophen] Nausea And Vomiting        Medication List     STOP taking these medications    doxycycline 100 MG tablet Commonly known as: VIBRA-TABS   flecainide 50 MG tablet Commonly known as: TAMBOCOR   fluorouracil 5 % cream Commonly known as: EFUDEX   fluticasone 50 MCG/ACT nasal spray Commonly known as: FLONASE   gabapentin 300 MG capsule Commonly known as: NEURONTIN   Rybelsus 3 MG Tabs Generic drug: Semaglutide   Rybelsus 7 MG Tabs Generic drug: Semaglutide       TAKE these medications    acetaminophen 500 MG tablet Commonly known as: TYLENOL Take 500 mg by mouth 2 (two) times  daily.   albuterol 108 (90 Base) MCG/ACT inhaler Commonly known as: VENTOLIN HFA Inhale 2 puffs by mouth every 6 hours as needed for wheezing or shortness of breath. What changed: Another medication with the same name was changed. Make sure you understand how and when to take each.   albuterol (2.5 MG/3ML) 0.083% nebulizer solution Commonly known as: PROVENTIL USE EVERY 4 HOURS WITH IPRATROPIUM What changed: See the new instructions.   amLODipine 10 MG tablet Commonly known as: NORVASC Take 1 tablet (10 mg total) by mouth daily.   Breztri Aerosphere 160-9-4.8 MCG/ACT Aero Generic drug: Budeson-Glycopyrrol-Formoterol Inhale 2 puffs into the lungs 2 (two) times daily.   cetirizine 10 MG tablet Commonly known as: ZYRTEC Take 10 mg by mouth daily.   Cholecalciferol 50 MCG (2000 UT) Tabs 1 tab by mouth once daily    cyclobenzaprine 5 MG tablet Commonly known as: FLEXERIL   Eliquis 5 MG Tabs tablet Generic drug: apixaban Take 1 tablet by mouth twice daily What changed:  how much to take additional instructions   escitalopram 20 MG tablet Commonly known as: LEXAPRO Take 1 tablet (20 mg total) by mouth daily. What changed: when to take this   estradiol 1 MG tablet Commonly known as: ESTRACE Take 1 mg by mouth daily.   FreeStyle [employer] Use as directed daily E11.9   furosemide 40 MG tablet Commonly known as: LASIX Take 1 tablet (40 mg total) by mouth daily.   glucose blood test strip Commonly known as: FREESTYLE LITE Use as instructed once daily E11.9   GUAIFENESIN ER PO Take 600 mg by mouth daily as needed for cough.   ipratropium 0.02 % nebulizer solution Commonly known as: ATROVENT INHALE 1 VIAL IN NEBULIZER 4 TIMES DAILY WITH  ALBUTEROL   ipratropium-albuterol 0.5-2.5 (3) MG/3ML Soln Commonly known as: DUONEB USE 1 AMPULE IN NEBULIZER 4 TIMES DAILY   irbesartan 150 MG tablet Commonly known as: AVAPRO Take 1 tablet (150 mg total) by mouth daily.   ketoconazole 2 % cream Commonly known as: NIZORAL   Lancets Misc Use as directed once daily E11.9   levothyroxine 50 MCG tablet Commonly known as: SYNTHROID Take 1 tablet (50 mcg total) by mouth daily.   LORazepam 1 MG tablet Commonly known as: ATIVAN Take 1 tablet (1 mg total) by mouth 2 (two) times daily as needed. for anxiety   Magnesium Oxide -Mg Supplement 500 MG Caps 1 tab by mouth twice per day What changed:  how much to take how to take this when to take this   metFORMIN 500 MG 24 hr tablet Commonly known as: GLUCOPHAGE-XR TAKE 2 TABLETS BY MOUTH ONCE DAILY WITH BREAKFAST   metoprolol tartrate 100 MG tablet Commonly known as: LOPRESSOR Take 1 tablet by mouth twice daily What changed: Another medication with the same name was removed. Continue taking this medication, and follow the directions you see  here.   pantoprazole 40 MG tablet Commonly known as: PROTONIX Take 1 tablet by mouth daily.   potassium chloride SA 20 MEQ tablet Commonly known as: KLOR-CON M Take 1 tablet (20 mEq total) by mouth daily.   predniSONE 10 MG tablet Commonly known as: DELTASONE Take 40 mg daily for 1 day, 30 mg daily for 1 day, 20 mg daily for 1 days,10 mg daily for 1 day, then stop What changed: additional instructions   promethazine-dextromethorphan 6.25-15 MG/5ML syrup Commonly known as: PROMETHAZINE-DM   rosuvastatin 40 MG tablet Commonly known as: CRESTOR Take 1 tablet (  40 mg total) by mouth daily.   tiZANidine 4 MG tablet Commonly known as: Zanaflex Take 1 tablet (4 mg total) by mouth every 6 (six) hours as needed for muscle spasms.   tolterodine 4 MG 24 hr capsule Commonly known as: DETROL LA Take 1 capsule (4 mg total) by mouth daily.        Follow-up Information     [facility] Follow up.   Specialty: Home Health Services Why: Someone will call you to schedule first home visit. If you have not received a call after two days of discharging home, call their number listed. If no one comes to assess, call Case Manager at [phone]. Contact information: [address] [place] [address] [phone]                Allergies  Allergen Reactions   Tramadol Hcl Nausea And Vomiting and Other (See Comments)     mouth dryness, headache   Other Nausea Only   Codeine Nausea And Vomiting and Nausea Only   Tape Rash    Plastic tape, bandaids and ekg leads, causes redness and rash Other reaction(s): Not available   Vicodin [Hydrocodone-Acetaminophen] Nausea And Vomiting     Other Procedures/Studies: DG Chest Port 1 View  Result Date: 04/20/2022 CLINICAL DATA:  sob, cough EXAM: PORTABLE CHEST 1 VIEW COMPARISON:  Chest x-ray 06/13/2020 FINDINGS: The heart and mediastinal contours are unchanged. Aortic calcification. No focal consolidation. No  pulmonary edema. No pleural effusion. No pneumothorax. No acute osseous abnormality. IMPRESSION: 1. No active disease. 2.  Aortic Atherosclerosis (ICD10-I70.0). Electronically Signed   By: Iven Finn M.D.   On: 04/20/2022 23:32     TODAY-DAY OF DISCHARGE:  Subjective:   Andrea Perry today has no headache,no chest abdominal pain,no new weakness tingling or numbness, feels much better wants to go home today.   Objective:   Blood pressure (!) 96/48, pulse 92, temperature (!) 97.4 F (36.3 C), temperature source Oral, resp. rate 16, height '5\' 2"'$  (1.575 m), weight 121.6 kg, SpO2 98 %.  Intake/Output Summary (Last 24 hours) at 04/26/2022 0856 Last data filed at 04/26/2022 0601 Gross per 24 hour  Intake 700 ml  Output 0 ml  Net 700 ml   Filed Weights   04/20/22 2305  Weight: 121.6 kg    Exam: Awake Alert, Oriented *3, No new F.N deficits, Normal affect [place].AT,PERRAL Supple Neck,No JVD, No cervical lymphadenopathy appriciated.  Symmetrical Chest wall movement, Good air movement bilaterally, CTAB RRR,No Gallops,Rubs or new Murmurs, No Parasternal Heave +ve B.Sounds, Abd Soft, Non tender, No organomegaly appriciated, No rebound -guarding or rigidity. No Cyanosis, Clubbing or edema, No new Rash or bruise   PERTINENT RADIOLOGIC STUDIES: No results found.   PERTINENT LAB RESULTS: CBC: No results for input(s): "WBC", "HGB", "HCT", "PLT" in the last 72 hours. CMET CMP     Component Value Date/Time   NA 133 (L) 04/25/2022 0758   NA 136 06/26/2020 1247   K 3.5 04/25/2022 0758   CL 86 (L) 04/25/2022 0758   CO2 30 04/25/2022 0758   GLUCOSE 277 (H) 04/25/2022 0758   BUN 28 (H) 04/25/2022 0758   BUN 24 06/26/2020 1247   CREATININE 1.14 (H) 04/25/2022 0758   CALCIUM 8.3 (L) 04/25/2022 0758   PROT 6.4 (L) 04/20/2022 2314   PROT 7.3 06/26/2020 1247   ALBUMIN 3.1 (L) 04/20/2022 2314   ALBUMIN 3.6 (L) 06/26/2020 1247   AST 24 04/20/2022 2314   ALT  13 04/20/2022 2314   ALKPHOS 75  04/20/2022 2314   BILITOT 0.6 04/20/2022 2314   BILITOT 0.6 06/26/2020 1247   GFRNONAA 52 (L) 04/25/2022 0758   GFRAA >60 03/28/2019 0912    GFR Estimated Creatinine Clearance: 57.9 mL/min (A) (by C-G formula based on SCr of 1.14 mg/dL (H)). No results for input(s): "LIPASE", "AMYLASE" in the last 72 hours. No results for input(s): "CKTOTAL", "CKMB", "CKMBINDEX", "TROPONINI" in the last 72 hours. Invalid input(s): "POCBNP" No results for input(s): "DDIMER" in the last 72 hours. No results for input(s): "HGBA1C" in the last 72 hours. No results for input(s): "CHOL", "HDL", "LDLCALC", "TRIG", "CHOLHDL", "LDLDIRECT" in the last 72 hours. No results for input(s): "TSH", "T4TOTAL", "T3FREE", "THYROIDAB" in the last 72 hours.  Invalid input(s): "FREET3" No results for input(s): "VITAMINB12", "FOLATE", "FERRITIN", "TIBC", "IRON", "RETICCTPCT" in the last 72 hours. Coags: No results for input(s): "INR" in the last 72 hours.  Invalid input(s): "PT" Microbiology: Recent Results (from the past 240 hour(s))  Resp panel by RT-PCR (RSV, Flu A&B, Covid) Anterior Nasal Swab     Status: Abnormal   Collection Time: 04/20/22 11:09 PM   Specimen: Anterior Nasal Swab  Result Value Ref Range Status   SARS Coronavirus 2 by RT PCR NEGATIVE NEGATIVE Final    Comment: (NOTE) SARS-CoV-2 target nucleic acids are NOT DETECTED.  The SARS-CoV-2 RNA is generally detectable in upper respiratory specimens during the acute phase of infection. The lowest concentration of SARS-CoV-2 viral copies this assay can detect is 138 copies/mL. A negative result does not preclude SARS-Cov-2 infection and should not be used as the sole basis for treatment or other patient management decisions. A negative result may occur with  improper specimen collection/handling, submission of specimen other than nasopharyngeal swab, presence of viral mutation(s) within the areas targeted by this assay, and inadequate number of  viral copies(<138 copies/mL). A negative result must be combined with clinical observations, patient history, and epidemiological information. The expected result is Negative.  Fact Sheet for Patients:  EntrepreneurPulse.com.au  Fact Sheet for Healthcare Providers:  IncredibleEmployment.be  This test is no t yet approved or cleared by the [place] FDA and  has been authorized for detection and/or diagnosis of SARS-CoV-2 by FDA under an Emergency Use Authorization (EUA). This EUA will remain  in effect (meaning this test can be used) for the duration of the COVID-19 declaration under Section 564(b)(1) of the Act, 21 U.S.C.section 360bbb-3(b)(1), unless the authorization is terminated  or revoked sooner.       Influenza A by PCR NEGATIVE NEGATIVE Final   Influenza B by PCR NEGATIVE NEGATIVE Final    Comment: (NOTE) The Xpert Xpress SARS-CoV-2/FLU/RSV plus assay is intended as an aid in the diagnosis of influenza from Nasopharyngeal swab specimens and should not be used as a sole basis for treatment. Nasal washings and aspirates are unacceptable for Xpert Xpress SARS-CoV-2/FLU/RSV testing.  Fact Sheet for Patients: EntrepreneurPulse.com.au  Fact Sheet for Healthcare Providers: IncredibleEmployment.be  This test is not yet approved or cleared by the [place] FDA and has been authorized for detection and/or diagnosis of SARS-CoV-2 by FDA under an Emergency Use Authorization (EUA). This EUA will remain in effect (meaning this test can be used) for the duration of the COVID-19 declaration under Section 564(b)(1) of the Act, 21 U.S.C. section 360bbb-3(b)(1), unless the authorization is terminated or revoked.     Resp Syncytial Virus by PCR POSITIVE (A) NEGATIVE Final    Comment: (NOTE) Fact Sheet for  Patients: EntrepreneurPulse.com.au  Fact Sheet for Healthcare  Providers: IncredibleEmployment.be  This test is not yet approved or cleared by the [place] FDA and has been authorized for detection and/or diagnosis of SARS-CoV-2 by FDA under an Emergency Use Authorization (EUA). This EUA will remain in effect (meaning this test can be used) for the duration of the COVID-19 declaration under Section 564(b)(1) of the Act, 21 U.S.C. section 360bbb-3(b)(1), unless the authorization is terminated or revoked.  Performed at [facility], [place] [address] [place], [place] [address]     FURTHER DISCHARGE INSTRUCTIONS:  Get Medicines reviewed and adjusted: Please take all your medications with you for your next visit with your Primary MD  Laboratory/radiological data: Please request your Primary MD to go over all hospital tests and procedure/radiological results at the follow up, please ask your Primary MD to get all Hospital records sent to his/her office.  In some cases, they will be blood work, cultures and biopsy results pending at the time of your discharge. Please request that your primary care M.D. goes through all the records of your hospital data and follows up on these results.  Also Note the following: If you experience worsening of your admission symptoms, develop shortness of breath, life threatening emergency, suicidal or homicidal thoughts you must seek medical attention immediately by calling 911 or calling your MD immediately  if symptoms less severe.  You must read complete instructions/literature along with all the possible adverse reactions/side effects for all the Medicines you take and that have been prescribed to you. Take any new Medicines after you have completely understood and accpet all the possible adverse reactions/side effects.   Do not drive when taking Pain medications or sleeping medications (Benzodaizepines)  Do not take more than prescribed Pain, Sleep and Anxiety Medications. It is not  advisable to combine anxiety,sleep and pain medications without talking with your primary care practitioner  Special Instructions: If you have smoked or chewed Tobacco  in the last 2 yrs please stop smoking, stop any regular Alcohol  and or any Recreational drug use.  Wear Seat belts while driving.  Please note: You were cared for by a hospitalist during your hospital stay. Once you are discharged, your primary care physician will handle any further medical issues. Please note that NO REFILLS for any discharge medications will be authorized once you are discharged, as it is imperative that you return to your primary care physician (or establish a relationship with a primary care physician if you do not have one) for your post hospital discharge needs so that they can reassess your need for medications and monitor your lab values.  Total Time spent coordinating discharge including counseling, education and face to face time equals greater than 30 minutes.  SignedOren Binet 04/26/2022 8:56 AM

## 2022-04-26 NOTE — Progress Notes (Signed)
Pt AVS reviewed and pt verbalized understanding of all DC teaching and instruction. Pt son will be picking pt up this afternoon when he gets off of work. She has all her belongings in her possession. IV removed without complications.

## 2022-04-28 ENCOUNTER — Telehealth: Payer: Self-pay | Admitting: *Deleted

## 2022-04-28 ENCOUNTER — Encounter: Payer: Self-pay | Admitting: *Deleted

## 2022-04-28 NOTE — Patient Outreach (Signed)
  Care Coordination [facility] Note Transition Care Management Unsuccessful Follow-up Telephone Call  Date of discharge and from where:  Sunday, 04/26/22 [facility]; acute respiratory failure with hypoxia/ COPD exacerbation secondary to RSV infection  Attempts:  1st Attempt  Reason for unsuccessful TCM follow-up call:  Left voice message  Oneta Rack, RN, BSN, CCRN Alumnus RN CM Care Coordination/ Transition of [place] Management [phone]: direct office

## 2022-04-29 ENCOUNTER — Telehealth: Payer: Self-pay | Admitting: *Deleted

## 2022-04-29 ENCOUNTER — Encounter: Payer: Self-pay | Admitting: *Deleted

## 2022-04-29 NOTE — Patient Outreach (Signed)
  Care Coordination [facility] Note Transition Care Management Unsuccessful Follow-up Telephone Call  Date of discharge and from where:  Sunday, 04/26/22 [facility]; acute respiratory failure with hypoxia/ COPD exacerbation secondary to RSV infection   Attempts:  2nd Attempt  Reason for unsuccessful TCM follow-up call:  Left voice message  Oneta Rack, RN, BSN, CCRN Alumnus RN CM Care Coordination/ Transition of [place] Management [phone]: direct office

## 2022-04-30 ENCOUNTER — Telehealth: Payer: Self-pay | Admitting: *Deleted

## 2022-04-30 ENCOUNTER — Encounter: Payer: Self-pay | Admitting: *Deleted

## 2022-04-30 NOTE — Patient Outreach (Signed)
Care Coordination [facility] Note Transition Care Management Follow-up Telephone Call Date of discharge and from where: Sunday 04/26/22; [facility]; Acute Respiratory Failure with hypoxia secondary to RSV How have you been since you were released from the hospital? "I am so much better and doing fine now.  I didn't think I needed home health coming out because I feel so much better.... I know if that changes that I can ask Dr. Jenny Reichmann to re-order the services when I see him in a couple of weeks.  I have all of my medications and only have one more day of prednisone; my son and daughter-in-law have gone through all of the medications and they get them ready for me in a pill box-- they handle all of my medicines and they are at work, so they are not here to review all of them, but we have reviewed them all together and we are positive we are doing things the way they told us to.  They arranged me to get meals through [employer], so that is a great benefit, and I should get the meals by [employer] soon.... until then, we have plenty to eat." Any questions or concerns? No  Items Reviewed: Did the pt receive and understand the discharge instructions provided? Yes  Medications obtained and verified? Yes -- verified new medications post-hospital discharge; patient unable to complete full medication review today; her family manages all aspects of medication administration and family is not currently available to complete full review Other? No  Any new allergies since your discharge? No  Dietary orders reviewed? Yes Do you have support at home? Yes   Home Care and Equipment/Supplies: Were home health services ordered? yes If so, what is the name of the agency? [facility]  Has the agency set up a time to come to the patient's home? Patient reports that she did hear from home health agency and declined services; states she does not need home health services because she is doing so much better Were any new equipment or  medical supplies ordered?  No What is the name of the medical supply agency? N/A Were you able to get the supplies/equipment? not applicable Do you have any questions related to the use of the equipment or supplies? No N/A  Functional Questionnaire: (I = Independent and D = Dependent) ADLs: I  Bathing/Dressing- I  Meal Prep- I  Eating- I  Maintaining continence- I  Transferring/Ambulation- I  Managing Meds- D- reports family/ son manages all aspects of medication administration, uses pill box, once prepared, patient take medications independently  Follow up appointments reviewed:  PCP Hospital f/u appt confirmed? Yes  Scheduled to see PCP, Dr. Jenny Reichmann on Wednesday, 05/13/22 @ 10:40 am-- verified this is within the recommended time frame for hospital follow up per discharge instructions Specialist Hospital f/u appt confirmed? No  Scheduled to see - on - @ - Are transportation arrangements needed? No  If their condition worsens, is the pt aware to call PCP or go to the Emergency Dept.? Yes Was the patient provided with contact information for the PCP's office or ED? Yes Was to pt encouraged to call back with questions or concerns? Yes- patient declined taking my direct contact information, stated she will call PCP office if she needs to call me or talk to anyone at office  SDOH assessments and interventions completed:   Yes SDOH Interventions Today    Flowsheet Row Most Recent Value  SDOH Interventions   Food Insecurity Interventions Intervention Not  Indicated  [employer] has set up meal delivery and she is expecting arrival of food "soon"]  Transportation Interventions Intervention Not Indicated  [family provides transportation]      Care Coordination Interventions:  Provided education around need to attend PCP office visit as scheduled; need to promptly contact care providers if new concerns arise    Encounter Outcome:  Pt. Visit Completed    Oneta Rack, RN, BSN, CCRN Alumnus RN CM Care Coordination/ Transition of [place] Management [phone]: direct office

## 2022-05-13 ENCOUNTER — Encounter: Payer: Self-pay | Admitting: Internal Medicine

## 2022-05-13 ENCOUNTER — Ambulatory Visit (INDEPENDENT_AMBULATORY_CARE_PROVIDER_SITE_OTHER): Payer: Medicare Other | Admitting: Internal Medicine

## 2022-05-13 VITALS — BP 118/64 | HR 77 | Temp 98.0°F | Ht 62.0 in | Wt 254.0 lb

## 2022-05-13 DIAGNOSIS — E1165 Type 2 diabetes mellitus with hyperglycemia: Secondary | ICD-10-CM

## 2022-05-13 DIAGNOSIS — E559 Vitamin D deficiency, unspecified: Secondary | ICD-10-CM

## 2022-05-13 DIAGNOSIS — J441 Chronic obstructive pulmonary disease with (acute) exacerbation: Secondary | ICD-10-CM

## 2022-05-13 DIAGNOSIS — J121 Respiratory syncytial virus pneumonia: Secondary | ICD-10-CM

## 2022-05-13 NOTE — Assessment & Plan Note (Signed)
Resolved, no further tx needed

## 2022-05-13 NOTE — Assessment & Plan Note (Signed)
Last vitamin D Lab Results  Component Value Date   VD25OH 25.33 (L) 02/04/2022   Low, to start oral replacement

## 2022-05-13 NOTE — Assessment & Plan Note (Signed)
Lab Results  Component Value Date   HGBA1C 7.0 (H) 02/04/2022   Stable, pt to continue current medical treatment metfomrin ER 500 mg - 2 qd

## 2022-05-13 NOTE — Progress Notes (Signed)
Patient ID: Andrea Perry, female   DOB: 1950-08-10, 72 y.o.   MRN: [id number]        Chief Complaint: follow up post hospn with Jan 1- 7 with RSV pna, copd exacerbation, DM, low vit d, low thyroid       HPI:  Andrea Perry is a 72 y.o. female here overall much improved after recent hospn as above;  Pt denies chest pain, increased sob or doe, wheezing, orthopnea, PND, increased LE swelling, palpitations, dizziness or syncope.   Pt denies polydipsia, polyuria, or new focal neuro s/s.    Pt denies fever, night sweats, loss of appetite, or other constitutional symptoms but Has Lost 14 ;lbs post d/c with better diet.   Wt Readings from Last 3 Encounters:  05/13/22 254 lb (115.2 kg)  04/20/22 268 lb 1.3 oz (121.6 kg)  02/04/22 268 lb (121.6 kg)   BP Readings from Last 3 Encounters:  05/13/22 118/64  04/26/22 (!) 96/48  02/04/22 (!) 146/90         Past Medical History:  Diagnosis Date   ALLERGIC RHINITIS 08/16/2008   ANXIETY 08/16/2008   Arthritis    hands, knees, lower back   ASTHMA 08/16/2008   Asthma    BRONCHITIS     DR. 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   GERD (gastroesophageal reflux disease)    H/O hiatal hernia    HEMATOCHEZIA 06/20/2009   Hemorrhoids    History of cardioversion 10/30/14   History of kidney stones    HYPERLIPIDEMIA 08/16/2008   HYPERTENSION 12/03/2006   Hypertension    Impaired glucose tolerance 09/21/2010   Overactive bladder 10/20/2015   Pneumonia    as a child   Pulmonary HTN ([place]) 06/19/2015   Shortness of breath dyspnea    Sleep apnea    MILD NO CPAP ORDERED   Snoring 06/19/2015   Past Surgical History:  Procedure Laterality Date   ABDOMINAL HYSTERECTOMY     APPENDECTOMY     CARDIOVERSION N/A 03/20/2014    Procedure: CARDIOVERSION;  Surgeon: Josue Hector, MD;  Location: [facility] ENDOSCOPY;  Service: Cardiovascular;  Laterality: N/A;   CARDIOVERSION N/A 10/30/2014   Procedure: CARDIOVERSION;  Surgeon: Josue Hector, MD;  Location: [facility] ENDOSCOPY;  Service: Cardiovascular;  Laterality: N/A;   CARDIOVERSION N/A 04/03/2019   Procedure: CARDIOVERSION;  Surgeon: Dorothy Spark, MD;  Location: [place];  Service: Cardiovascular;  Laterality: N/A;   CARPAL TUNNEL RELEASE     Right + LEFT   CESAREAN SECTION     x 1    COLONOSCOPY  2004   CYST EXCISION     RT ARM    CYSTOCELE REPAIR N/A 10/25/2012   Procedure: ANTERIOR REPAIR (CYSTOCELE);  Surgeon: Gus Height, MD;  Location: [facility];  Service: Gynecology;  Laterality: N/A;   HEEL SPUR SURGERY Bilateral    KNEE SURGERY     Right + LEFT   LUMBAR LAMINECTOMY/DECOMPRESSION MICRODISCECTOMY N/A 03/19/2016   Procedure: Laminectomy and Foraminotomy - Lumbar three-four - Lumbar four-five;  Surgeon: Eustace Moore, MD;  Location: [place];  Service: Neurosurgery;  Laterality: N/A;   RADIOLOGY  WITH ANESTHESIA Right 11/01/2014   Procedure: MRI RIGHT FOREARM;  Surgeon: Medication Radiologist, MD;  Location: [place];  Service: Radiology;  Laterality: Right;   RADIOLOGY WITH ANESTHESIA Right 08/29/2015   Procedure: MRI - RIGHT FOREARM;  Surgeon: Medication Radiologist, MD;  Location: [place];  Service: Radiology;  Laterality: Right;   RADIOLOGY WITH ANESTHESIA N/A 12/05/2015   Procedure: MRI SPINE WITHOUT;  Surgeon: Medication Radiologist, MD;  Location: [place];  Service: Radiology;  Laterality: N/A;   SKIN CANCER EXCISION     BILAT SHOULDERS   SPLIT NIGHT STUDY  09/04/2015   TONSILLECTOMY AND ADENOIDECTOMY      reports that she quit smoking about 19 years ago. Her smoking use included cigarettes. She has a 45.00 pack-year smoking history. She has never used smokeless tobacco. She reports current alcohol use of about 2.0 standard drinks of alcohol per week. She reports that  she does not use drugs. family history includes Colon polyps in her mother; Diabetes in her mother; Hyperlipidemia in her mother; Hypertension in her mother; Stroke in her father. Allergies  Allergen Reactions   Tramadol Hcl Nausea And Vomiting and Other (See Comments)     mouth dryness, headache   Other Nausea Only   Codeine Nausea And Vomiting and Nausea Only   Tape Rash    Plastic tape, bandaids and ekg leads, causes redness and rash Other reaction(s): Not available   Vicodin [Hydrocodone-Acetaminophen] Nausea And Vomiting   Current Outpatient Medications on File Prior to Visit  Medication Sig Dispense Refill   acetaminophen (TYLENOL) 500 MG tablet Take 500 mg by mouth 2 (two) times daily.     albuterol (PROVENTIL) (2.5 MG/3ML) 0.083% nebulizer solution USE EVERY 4 HOURS WITH IPRATROPIUM 300 mL 12   albuterol (VENTOLIN HFA) 108 (90 Base) MCG/ACT inhaler Inhale 2 puffs by mouth every 6 hours as needed for wheezing or shortness of breath. 18 g 5   amLODipine (NORVASC) 10 MG tablet Take 1 tablet (10 mg total) by mouth daily. 90 tablet 3   apixaban (ELIQUIS) 5 MG TABS tablet Take 1 tablet by mouth twice daily (Patient taking differently: Take 5 mg by mouth 2 (two) times daily. Take 1 tablet by mouth twice daily) 60 tablet 5   Blood Glucose Monitoring Suppl (FREESTYLE LITE) DEVI Use as directed daily E11.9 1 each 0   Budeson-Glycopyrrol-Formoterol (BREZTRI AEROSPHERE) 160-9-4.8 MCG/ACT AERO Inhale 2 puffs into the lungs 2 (two) times daily. 10.7 g 11   cetirizine (ZYRTEC) 10 MG tablet Take 10 mg by mouth daily.     Cholecalciferol 50 MCG (2000 UT) TABS 1 tab by mouth once daily 30 tablet 99   cyclobenzaprine (FLEXERIL) 5 MG tablet      escitalopram (LEXAPRO) 20 MG tablet Take 1 tablet (20 mg total) by mouth daily. (Patient taking differently: Take 20 mg by mouth at bedtime.) 90 tablet 3   estradiol (ESTRACE) 1 MG tablet Take 1 mg by mouth daily.     furosemide (LASIX) 40 MG tablet Take 1  tablet (40 mg total) by mouth daily. 90 tablet 3   glucose blood (FREESTYLE LITE) test strip Use as instructed once daily E11.9 100 each 12   GUAIFENESIN ER PO Take 600 mg by mouth daily as needed for cough.     ipratropium (ATROVENT) 0.02 % nebulizer solution INHALE 1 VIAL IN NEBULIZER 4 TIMES DAILY WITH  ALBUTEROL 300 mL 11   ipratropium-albuterol (DUONEB) 0.5-2.5 (3) MG/3ML SOLN USE 1 AMPULE IN NEBULIZER 4 TIMES DAILY  360 mL 5   irbesartan (AVAPRO) 150 MG tablet Take 1 tablet (150 mg total) by mouth daily. 90 tablet 3   ketoconazole (NIZORAL) 2 % cream      Lancets MISC Use as directed once daily E11.9 100 each 11   levothyroxine (SYNTHROID) 50 MCG tablet Take 1 tablet (50 mcg total) by mouth daily. 90 tablet 3   LORazepam (ATIVAN) 1 MG tablet Take 1 tablet (1 mg total) by mouth 2 (two) times daily as needed. for anxiety 60 tablet 2   Magnesium Oxide -Mg Supplement 500 MG CAPS 1 tab by mouth twice per day (Patient taking differently: Take 1 capsule by mouth in the morning. 1 tab by mouth twice per day) 60 capsule 11   metFORMIN (GLUCOPHAGE-XR) 500 MG 24 hr tablet TAKE 2 TABLETS BY MOUTH ONCE DAILY WITH BREAKFAST 180 tablet 3   metoprolol tartrate (LOPRESSOR) 100 MG tablet Take 1 tablet by mouth twice daily (Patient taking differently: Take 100 mg by mouth 2 (two) times daily.) 180 tablet 3   pantoprazole (PROTONIX) 40 MG tablet Take 1 tablet by mouth daily.     potassium chloride SA (KLOR-CON) 20 MEQ tablet Take 1 tablet (20 mEq total) by mouth daily. 90 tablet 3   predniSONE (DELTASONE) 10 MG tablet Take 40 mg daily for 1 day, 30 mg daily for 1 day, 20 mg daily for 1 days,10 mg daily for 1 day, then stop 10 tablet 0   promethazine-dextromethorphan (PROMETHAZINE-DM) 6.25-15 MG/5ML syrup      rosuvastatin (CRESTOR) 40 MG tablet Take 1 tablet (40 mg total) by mouth daily. 90 tablet 0   tiZANidine (ZANAFLEX) 4 MG tablet Take 1 tablet (4 mg total) by mouth every 6 (six) hours as needed for muscle  spasms. 60 tablet 2   tolterodine (DETROL LA) 4 MG 24 hr capsule Take 1 capsule (4 mg total) by mouth daily. (Patient not taking: Reported on 04/21/2022) 90 capsule 3   No current facility-administered medications on file prior to visit.        ROS:  All others reviewed and negative.  Objective        PE:  BP 118/64 (BP Location: Right Arm, Patient Position: Sitting, Cuff Size: Large)   Pulse 77   Temp 98 F (36.7 C) (Oral)   Ht '5\' 2"'$  (1.575 m)   Wt 254 lb (115.2 kg)   SpO2 94%   BMI 46.46 kg/m                 Constitutional: Pt appears in NAD               HENT: Head: NCAT.                Right Ear: External ear normal.                 Left Ear: External ear normal.                Eyes: . Pupils are equal, round, and reactive to light. Conjunctivae and EOM are normal               Nose: without d/c or deformity               Neck: Neck supple. Gross normal ROM               Cardiovascular: Normal rate and regular rhythm.  Pulmonary/Chest: Effort normal and breath sounds decreased without rales or wheezing.                Abd:  Soft, NT, ND, + BS, no organomegaly               Neurological: Pt is alert. At baseline orientation, motor grossly intact               Skin: Skin is warm. No rashes, no other new lesions, LE edema - none               Psychiatric: Pt behavior is normal without agitation   Micro: none  Cardiac tracings I have personally interpreted today:  none  Pertinent Radiological findings (summarize): none   Lab Results  Component Value Date   WBC 16.7 (H) 04/22/2022   HGB 11.7 (L) 04/22/2022   HCT 35.7 (L) 04/22/2022   PLT 304 04/22/2022   GLUCOSE 277 (H) 04/25/2022   CHOL 100 02/04/2022   TRIG 126.0 02/04/2022   HDL 42.90 02/04/2022   LDLDIRECT 111.0 07/09/2020   LDLCALC 32 02/04/2022   ALT 13 04/20/2022   AST 24 04/20/2022   NA 133 (L) 04/25/2022   K 3.5 04/25/2022   CL 86 (L) 04/25/2022   CREATININE 1.14 (H) 04/25/2022   BUN 28  (H) 04/25/2022   CO2 30 04/25/2022   TSH 7.01 (H) 02/04/2022   INR 1.2 04/20/2022   HGBA1C 7.0 (H) 02/04/2022   MICROALBUR 20.0 (H) 07/18/2021   Assessment/Plan:  GENEEN DIETER is a 72 y.o. White or Caucasian [1] female with  has a past medical history of ALLERGIC RHINITIS (08/16/2008), ANXIETY (08/16/2008), Arthritis, ASTHMA (08/16/2008), Asthma, Bladder leak, Cancer ([place]), Chronic lower back pain, COPD (08/16/2008), DEPRESSION (08/16/2008), Diabetes mellitus without complication ([place]), Dysrhythmia, Excessive daytime sleepiness (06/19/2015), GENITAL HERPES (12/03/2006), GERD (gastroesophageal reflux disease), H/O hiatal hernia, HEMATOCHEZIA (06/20/2009), Hemorrhoids, History of cardioversion (10/30/14), History of kidney stones, HYPERLIPIDEMIA (08/16/2008), HYPERTENSION (12/03/2006), Hypertension, Impaired glucose tolerance (09/21/2010), Overactive bladder (10/20/2015), Pneumonia, Pulmonary HTN ([place]) (06/19/2015), Shortness of breath dyspnea, Sleep apnea, and Snoring (06/19/2015).  Vitamin D deficiency Last vitamin D Lab Results  Component Value Date   VD25OH 25.33 (L) 02/04/2022   Low, to start oral replacement   COPD exacerbation (HCC) Improved, cont albut inhaler prn  Diabetes ([place]) Lab Results  Component Value Date   HGBA1C 7.0 (H) 02/04/2022   Stable, pt to continue current medical treatment metfomrin ER 500 mg - 2 qd   RSV (respiratory syncytial virus pneumonia) Resolved, no further tx needed  Followup: Return in about 3 months (around 08/07/2022).  Cathlean Cower, MD 05/13/2022 12:48 PM [facility]

## 2022-05-13 NOTE — Patient Instructions (Signed)
Please continue all other medications as before, and refills have been done if requested.  Please have the pharmacy call with any other refills you may need.  Please continue your efforts at being more active, low cholesterol diet, and weight control.  Please keep your appointments with your specialists as you may have planned  Please make an Appointment to return in April 19 as planned, or sooner if needed

## 2022-05-13 NOTE — Assessment & Plan Note (Signed)
Improved, cont albut inhaler prn

## 2022-05-20 NOTE — Progress Notes (Deleted)
CARDIOLOGY OFFICE NOTE  Date:  05/20/2022    [place] Date of Birth: 04-01-1951 Medical Record #[id number]  PCP:  Biagio Borg, MD  Cardiologist:  Gillian Shields  No chief complaint on file.   History of Present Illness: Andrea Perry is a 72 y.o. female with a  history of diastolic dysfunction, DM, HTN, HLD and long standing symptomatic PAF - on anticoagulation  No history of CAD  - last Myoview from 2016 was normal.  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 Compliant with eliquis and no bleeding issues Had more edema with norvasc and it was d/c   She sees [facility] for COPD/Asthma with some peripheral eosinophilia Rx with [name] and DuoNeb Zyrtec for allergic rhinitis Uses protonix for GERD Sleep study negative   In general doing well with no palpitations, syncope or chest pain Chronic dyspnea from obesity and lung dx  Hospitalized 04/26/2022 with COPD exacerbation from RSV  CXR no PNA Rx with steroids, diuretics and nebs BNP only mildly elevated during stay at [facility]   ***    Past Medical History:  Diagnosis Date   ALLERGIC RHINITIS 08/16/2008   ANXIETY 08/16/2008   Arthritis    hands, knees, lower back   ASTHMA 08/16/2008   Asthma    BRONCHITIS     DR. Lamonte Sakai     Bladder leak    Cancer (HCC)    MELANOMA       Chronic lower back pain    problems with disc L2-5   COPD 08/16/2008   DEPRESSION 08/16/2008   Diabetes mellitus without complication (HCC)    Dysrhythmia    hx AF   Excessive daytime sleepiness 06/19/2015   GENITAL HERPES 12/03/2006   GERD (gastroesophageal reflux disease)    H/O hiatal hernia    HEMATOCHEZIA 06/20/2009   Hemorrhoids    History of cardioversion 10/30/14   History of kidney stones    HYPERLIPIDEMIA  08/16/2008   HYPERTENSION 12/03/2006   Hypertension    Impaired glucose tolerance 09/21/2010   Overactive bladder 10/20/2015   Pneumonia    as a child   Pulmonary HTN ([place]) 06/19/2015   Shortness of breath dyspnea    Sleep apnea    MILD NO CPAP ORDERED   Snoring 06/19/2015    Past Surgical History:  Procedure Laterality Date   ABDOMINAL HYSTERECTOMY     APPENDECTOMY     CARDIOVERSION N/A 03/20/2014   Procedure: CARDIOVERSION;  Surgeon: Josue Hector, MD;  Location: [facility] ENDOSCOPY;  Service: Cardiovascular;  Laterality: N/A;   CARDIOVERSION N/A 10/30/2014   Procedure: CARDIOVERSION;  Surgeon: Josue Hector, MD;  Location: [facility] ENDOSCOPY;  Service: Cardiovascular;  Laterality: N/A;   CARDIOVERSION N/A 04/03/2019   Procedure: CARDIOVERSION;  Surgeon: Dorothy Spark, MD;  Location: [facility] ENDOSCOPY;  Service: Cardiovascular;  Laterality: N/A;   CARPAL TUNNEL RELEASE     Right + LEFT   CESAREAN SECTION     x 1    COLONOSCOPY  2004   CYST EXCISION     RT ARM    CYSTOCELE REPAIR N/A 10/25/2012   Procedure: ANTERIOR REPAIR (CYSTOCELE);  Surgeon: Gus Height, MD;  Location: [facility];  Service: Gynecology;  Laterality: N/A;   HEEL SPUR SURGERY Bilateral    KNEE SURGERY     Right + LEFT   LUMBAR LAMINECTOMY/DECOMPRESSION MICRODISCECTOMY N/A 03/19/2016   Procedure: Laminectomy and Foraminotomy - Lumbar three-four - Lumbar four-five;  Surgeon: Eustace Moore, MD;  Location: [place];  Service: Neurosurgery;  Laterality: N/A;   RADIOLOGY WITH ANESTHESIA Right 11/01/2014   Procedure: MRI RIGHT FOREARM;  Surgeon: Medication Radiologist, MD;  Location: [place];  Service: Radiology;  Laterality: Right;   RADIOLOGY WITH ANESTHESIA Right 08/29/2015   Procedure: MRI - RIGHT FOREARM;  Surgeon: Medication Radiologist, MD;  Location: [place];  Service: Radiology;  Laterality: Right;   RADIOLOGY WITH ANESTHESIA N/A 12/05/2015   Procedure: MRI SPINE WITHOUT;  Surgeon: Medication Radiologist, MD;  Location: [place];  Service:  Radiology;  Laterality: N/A;   SKIN CANCER EXCISION     BILAT SHOULDERS   SPLIT NIGHT STUDY  09/04/2015   TONSILLECTOMY AND ADENOIDECTOMY       Medications: No outpatient medications have been marked as taking for the 05/27/22 encounter (Appointment) with Josue Hector, MD.    Allergies: Allergies  Allergen Reactions   Tramadol Hcl Nausea And Vomiting and Other (See Comments)     mouth dryness, headache   Other Nausea Only   Codeine Nausea And Vomiting and Nausea Only   Tape Rash    Plastic tape, bandaids and ekg leads, causes redness and rash Other reaction(s): Not available   Vicodin [Hydrocodone-Acetaminophen] Nausea And Vomiting    Social History: The patient  reports that she quit smoking about 19 years ago. Her smoking use included cigarettes. She has a 45.00 pack-year smoking history. She has never used smokeless tobacco. She reports current alcohol use of about 2.0 standard drinks of alcohol per week. She reports that she does not use drugs.   Family History: The patient's family history includes Colon polyps in her mother; Diabetes in her mother; Hyperlipidemia in her mother; Hypertension in her mother; Stroke in her father.   Review of Systems: Please see the history of present illness.   All other systems are reviewed and negative.   Physical Exam: VS:  There were no vitals taken for this visit. Marland Kitchen  BMI There is no height or weight on file to calculate BMI.  Wt Readings from Last 3 Encounters:  05/13/22 254 lb (115.2 kg)  04/20/22 268 lb 1.3 oz (121.6 kg)  02/04/22 268 lb (121.6 kg)   Affect appropriate Overweight white female  HEENT: normal Neck supple with no adenopathy JVP normal no bruits no thyromegaly Lungs clear with no wheezing and good diaphragmatic motion Heart:  S1/S2 no murmur, no rub, gallop or click PMI normal Abdomen: benighn, BS positve, no tenderness, no AAA no bruit.  No HSM or HJR Distal pulses intact with no bruits No edema Neuro  non-focal Skin warm and dry No muscular weakness   LABORATORY DATA:  EKG:   06/06/19 afib rate 99 nonspecific ST changes  05/20/2022 afib rate 124 PVC;s nonspecific ST changes   Lab Results  Component Value Date   WBC 16.7 (H) 04/22/2022   HGB 11.7 (L) 04/22/2022   HCT 35.7 (L) 04/22/2022   PLT 304 04/22/2022   GLUCOSE 277 (H) 04/25/2022   CHOL 100 02/04/2022   TRIG 126.0 02/04/2022   HDL 42.90 02/04/2022   LDLDIRECT 111.0 07/09/2020   LDLCALC 32 02/04/2022   ALT 13 04/20/2022   AST 24 04/20/2022   NA 133 (L) 04/25/2022   K  3.5 04/25/2022   CL 86 (L) 04/25/2022   CREATININE 1.14 (H) 04/25/2022   BUN 28 (H) 04/25/2022   CO2 30 04/25/2022   TSH 7.01 (H) 02/04/2022   INR 1.2 04/20/2022   HGBA1C 7.0 (H) 02/04/2022   MICROALBUR 20.0 (H) 07/18/2021     BNP (last 3 results) Recent Labs    04/21/22 0208  BNP 365.6*    ProBNP (last 3 results) No results for input(s): "PROBNP" in the last 8760 hours.    Other Studies Reviewed Today:  ECHO IMPRESSIONS 05/2019   1. Left ventricular ejection fraction, by visual estimation, is 55 to  60%. The left ventricle has normal function. There is no left ventricular  hypertrophy.   2. Left ventricular diastolic parameters are indeterminate.   3. Global right ventricle has normal systolic function.The right  ventricular size is normal.   4. Left atrial size was normal.   5. Right atrial size was normal.   6. Mild mitral annular calcification.   7. The mitral valve is normal in structure. No evidence of mitral valve  regurgitation.   8. The tricuspid valve is normal in structure.   9. The tricuspid valve is normal in structure. Tricuspid valve  regurgitation is not demonstrated.  10. The aortic valve was not well visualized. Aortic valve regurgitation  is not visualized. No evidence of aortic valve stenosis.  11. The pulmonic valve was not well visualized. Pulmonic valve  regurgitation is not visualized.  12. The inferior  vena cava is dilated in size with <50% respiratory  variability, suggesting right atrial pressure of 15 mmHg.  13. TR signal is inadequate for assessing pulmonary artery systolic  pressure.        Procedure: DC Cardioversion 03/2019 Indications: atrial fibrillation   Procedure Details Consent: Obtained Time Out: Verified patient identification, verified procedure, site/side was marked, verified correct patient position, special equipment/implants available, Radiology Safety Procedures followed,  medications/allergies/relevent history reviewed, required imaging and test results available.  Performed   The patient has been on adequate anticoagulation.  The patient received IV propofol administered by anesthesia staff for deep sedation.  Synchronous cardioversion was performed at 120 joules.   The cardioversion was successful.     Complications: No apparent complications Patient did tolerate procedure well.     [name], MD, [facility] 04/03/2019, 10:38 AM     ASSESSMENT & PLAN:     1 Persistent AF - has opted for rate control and continued anticoagulation with no plans for further attempts at restoration back to NSR. HR is fine today. Continue eliquis and lopressor [facility] to contact regarding any patient assistance forms for eliquis   2. HTN - improved norvasc d/c due to swelling   3. Dysphagia - swallow study negative 02/02/20   4. Probable diastolic dysfunction - she has good BP control - weight trending down. On daily lasix and Kdur update echo given recent hospitalization with mildly elevated BNP   5. HLD -  On pravastatin labs with primary LDL 80 01/09/21  6. COPD/Asthma:  f/u [facility] zyrtec and inhalers  Recent steroid use with RSV infection CXR with no overt CHF or PNA    Current medicines are reviewed with the patient today.  The patient does not have concerns regarding medicines other than what has been noted above.  The following changes have been made:  See  above.  Labs/ tests ordered today include: Echo for afib and diastolic dysfunction     No orders of the defined  types were placed in this encounter.    Disposition:   F/U in a year  Patient is agreeable to this plan and will call if any problems develop in the interim.   Signed: Jenkins Rouge, MD  05/20/2022 5:19 PM  [facility] [address] [place], [place]  [address] Phone: [phone] Fax: [phone]

## 2022-05-27 ENCOUNTER — Ambulatory Visit: Payer: Medicare Other | Admitting: Cardiovascular Disease

## 2022-05-28 ENCOUNTER — Encounter (HOSPITAL_COMMUNITY): Payer: Self-pay | Admitting: *Deleted

## 2022-06-08 ENCOUNTER — Telehealth: Payer: Self-pay | Admitting: Emergency Medicine

## 2022-06-10 NOTE — Telephone Encounter (Signed)
ATC x1 and can not leave a message.

## 2022-06-10 NOTE — Telephone Encounter (Signed)
Tried calling the pt x 3 and each time her line rings busy.

## 2022-06-12 ENCOUNTER — Other Ambulatory Visit: Payer: Self-pay

## 2022-06-12 MED ORDER — DOXYCYCLINE HYCLATE 100 MG PO TABS: 100.0000 mg | ORAL_TABLET | Freq: Two times a day (BID) | ORAL | 0 refills | Status: DC

## 2022-06-12 NOTE — Telephone Encounter (Signed)
Spoke with the pt  She is c/o increased SOB, wheezing and cough with green sputum over the past 5-7 days  She is using all meds as directed  Has not taken a covid test, but states she just had covid back in Jan 2024  She denies any fevers, aches  Please advise thanks!  Allergies  Allergen Reactions   Tramadol Hcl Nausea And Vomiting and Other (See Comments)     mouth dryness, headache   Other Nausea Only   Codeine Nausea And Vomiting and Nausea Only   Tape Rash    Plastic tape, bandaids and ekg leads, causes redness and rash Other reaction(s): Not available   Vicodin [Hydrocodone-Acetaminophen] Nausea And Vomiting

## 2022-06-12 NOTE — Telephone Encounter (Signed)
ATC X1 mailbox is full. When patient returns call advise doxycycline has been sent to pharmacy and to please call back once prescription is finished and there is no improvement

## 2022-06-12 NOTE — Telephone Encounter (Signed)
Please ask her to take doxycycline 100 mg twice a day for 7 days.  She is to call us if she not improving.

## 2022-06-18 NOTE — Telephone Encounter (Signed)
I called and spoke with the pt  She was able to get her doxy and is feeling much improved with one dose remaining  She will complete this and call back for ov if symptoms worsen again  Nothing further needed

## 2022-07-05 NOTE — Progress Notes (Unsigned)
Cardiology Office Note:    Date:  07/06/2022   ID:  Andrea Perry, DOB 08-27-1950, MRN [id number]  PCP:  Andrea Borg, MD   [facility] Prov[facility] Cardiologist:  Andrea Rouge, MD     Referring MD: Andrea Borg, MD   Chief Complaint: follow-up a fib  History of Present Illness:    Andrea Perry is a pleasant 72 y.o. female with a hx of symptomatic persistent atrial fibrillation, hypertension, hyperlipidemia, diastolic dysfunction, diabetes, GERD, COPD, asthma, and obesity.  Myoview 2016 was normal. Echo 05/22/2020 with EF 50 to 55% LA 37 mm done during COVID infection.  Failed DCCV x 2 as well as flecainide. Did not want to pursue hospitalization for Tikosyn or ablation. Has been rate controlled and on Eliquis for [place].  Is Dr. Malvin Perry with pulmonology for COPD/asthma with some peripheral eosinophilia.  Had a negative sleep study.  Last cardiology clinic visit was 05/27/2021 with Dr. Johnsie Cancel.  She was doing well at that time with no specific cardiac concerns.  She was advised to return in 1 year for follow-up  Today, she is here alone for follow-up. Hospital admission for COPD exacerbation the first of January. Has been doing well since that time. Has bilateral lower extremity edema that has worsened recently. Was told by Dr. Jenny Perry to increase Lasix x 3 days. Edema is getting better. States her feet stay red in color all of the time. Admits to dietary indiscretion with sodium. Tries to elevate legs when she is sitting. Does not get much exercise. She denies chest pain, fatigue, palpitations, melena, hematuria, hemoptysis, diaphoresis, weakness, presyncope, syncope, orthopnea, and PND.    Past Medical History:  Diagnosis Date   ALLERGIC RHINITIS 08/16/2008   ANXIETY 08/16/2008   Arthritis    hands, knees, lower back   ASTHMA 08/16/2008   Asthma    BRONCHITIS     DR. Lamonte Sakai     Bladder leak    Cancer (HCC)    MELANOMA       Chronic lower back pain    problems with disc L2-5   COPD  08/16/2008   DEPRESSION 08/16/2008   Diabetes mellitus without complication ([place])    Dysrhythmia    hx AF   Excessive daytime sleepiness 06/19/2015   GENITAL HERPES 12/03/2006   GERD (gastroesophageal reflux disease)    H/O hiatal hernia    HEMATOCHEZIA 06/20/2009   Hemorrhoids    History of cardioversion 10/30/14   History of kidney stones    HYPERLIPIDEMIA 08/16/2008   HYPERTENSION 12/03/2006   Hypertension    Impaired glucose tolerance 09/21/2010   Overactive bladder 10/20/2015   Pneumonia    as a child   Pulmonary HTN ([place]) 06/19/2015   Shortness of breath dyspnea    Sleep apnea    MILD NO CPAP ORDERED   Snoring 06/19/2015    Past Surgical History:  Procedure Laterality Date   ABDOMINAL HYSTERECTOMY     APPENDECTOMY     CARDIOVERSION N/A 03/20/2014   Procedure: CARDIOVERSION;  Surgeon: Josue Hector, MD;  Location: [facility];  Service: Cardiovascular;  Laterality: N/A;   CARDIOVERSION N/A 10/30/2014   Procedure: CARDIOVERSION;  Surgeon: Josue Hector, MD;  Location: [facility];  Service: Cardiovascular;  Laterality: N/A;   CARDIOVERSION N/A 04/03/2019   Procedure: CARDIOVERSION;  Surgeon: Dorothy Spark, MD;  Location: [facility] ENDOSCOPY;  Service: Cardiovascular;  Laterality: N/A;   CARPAL TUNNEL RELEASE     Right + LEFT  CESAREAN SECTION     x 1    COLONOSCOPY  2004   CYST EXCISION     RT ARM    CYSTOCELE REPAIR N/A 10/25/2012   Procedure: ANTERIOR REPAIR (CYSTOCELE);  Surgeon: Gus Height, MD;  Location: [facility];  Service: Gynecology;  Laterality: N/A;   HEEL SPUR SURGERY Bilateral    KNEE SURGERY     Right + LEFT   LUMBAR LAMINECTOMY/DECOMPRESSION MICRODISCECTOMY N/A 03/19/2016   Procedure: Laminectomy and Foraminotomy - Lumbar three-four - Lumbar four-five;  Surgeon: Eustace Moore, MD;  Location: [facility];  Service: Neurosurgery;  Laterality: N/A;   RADIOLOGY WITH ANESTHESIA Right 11/01/2014   Procedure: MRI RIGHT FOREARM;  Surgeon: Medication Radiologist, MD;  Location: [facility];   Service: Radiology;  Laterality: Right;   RADIOLOGY WITH ANESTHESIA Right 08/29/2015   Procedure: MRI - RIGHT FOREARM;  Surgeon: Medication Radiologist, MD;  Location: [facility];  Service: Radiology;  Laterality: Right;   RADIOLOGY WITH ANESTHESIA N/A 12/05/2015   Procedure: MRI SPINE WITHOUT;  Surgeon: Medication Radiologist, MD;  Location: [facility];  Service: Radiology;  Laterality: N/A;   SKIN CANCER EXCISION     BILAT SHOULDERS   SPLIT NIGHT STUDY  09/04/2015   TONSILLECTOMY AND ADENOIDECTOMY      Current Medications: Current Meds  Medication Sig   acetaminophen (TYLENOL) 500 MG tablet Take 500 mg by mouth 2 (two) times daily.   albuterol (PROVENTIL) (2.5 MG/3ML) 0.083% nebulizer solution USE EVERY 4 HOURS WITH IPRATROPIUM   amLODipine (NORVASC) 10 MG tablet Take 1 tablet (10 mg total) by mouth daily.   apixaban (ELIQUIS) 5 MG TABS tablet Take 1 tablet by mouth twice daily (Patient taking differently: Take 5 mg by mouth 2 (two) times daily. Take 1 tablet by mouth twice daily)   Blood Glucose Monitoring Suppl (FREESTYLE LITE) DEVI Use as directed daily E11.9   Budeson-Glycopyrrol-Formoterol (BREZTRI AEROSPHERE) 160-9-4.8 MCG/ACT AERO Inhale 2 puffs into the lungs 2 (two) times daily.   cetirizine (ZYRTEC) 10 MG tablet Take 10 mg by mouth daily.   Cholecalciferol 50 MCG (2000 UT) TABS 1 tab by mouth once daily   cyclobenzaprine (FLEXERIL) 5 MG tablet    dimenhyDRINATE (DRAMAMINE) 50 MG tablet Take by mouth every 8 (eight) hours as needed.   escitalopram (LEXAPRO) 20 MG tablet Take 1 tablet (20 mg total) by mouth daily. (Patient taking differently: Take 20 mg by mouth at bedtime.)   estradiol (ESTRACE) 1 MG tablet Take 1 mg by mouth daily.   fluorouracil (EFUDEX) 5 % cream Apply topically.   furosemide (LASIX) 40 MG tablet Take 1 tablet (40 mg total) by mouth daily.   glucose blood (FREESTYLE LITE) test strip Use as instructed once daily E11.9   GUAIFENESIN ER PO Take 600 mg by mouth daily as  needed for cough.   ipratropium (ATROVENT) 0.02 % nebulizer solution INHALE 1 VIAL IN NEBULIZER 4 TIMES DAILY WITH  ALBUTEROL   ipratropium-albuterol (DUONEB) 0.5-2.5 (3) MG/3ML SOLN USE 1 AMPULE IN NEBULIZER 4 TIMES DAILY   irbesartan (AVAPRO) 150 MG tablet Take 1 tablet (150 mg total) by mouth daily.   ketoconazole (NIZORAL) 2 % cream    Lancets MISC Use as directed once daily E11.9   levothyroxine (SYNTHROID) 50 MCG tablet Take 1 tablet (50 mcg total) by mouth daily.   LORazepam (ATIVAN) 1 MG tablet Take 1 tablet (1 mg total) by mouth 2 (two) times daily as needed. for anxiety   Magnesium Oxide -Mg Supplement 500 MG  CAPS 1 tab by mouth twice per day (Patient taking differently: Take 1 capsule by mouth in the morning. 1 tab by mouth twice per day)   metFORMIN (GLUCOPHAGE-XR) 500 MG 24 hr tablet TAKE 2 TABLETS BY MOUTH ONCE DAILY WITH BREAKFAST   metoprolol tartrate (LOPRESSOR) 100 MG tablet Take 1 tablet by mouth twice daily (Patient taking differently: Take 100 mg by mouth 2 (two) times daily.)   pantoprazole (PROTONIX) 40 MG tablet Take 1 tablet by mouth daily.   potassium chloride SA (KLOR-CON) 20 MEQ tablet Take 1 tablet (20 mEq total) by mouth daily.   promethazine-dextromethorphan (PROMETHAZINE-DM) 6.25-15 MG/5ML syrup    rosuvastatin (CRESTOR) 40 MG tablet Take 1 tablet (40 mg total) by mouth daily.   TETRAHYDROZOLINE HCL OP Apply to eye.   tiZANidine (ZANAFLEX) 4 MG tablet Take 1 tablet (4 mg total) by mouth every 6 (six) hours as needed for muscle spasms.   vitamin E 180 MG (400 UNITS) capsule Take by mouth.   [DISCONTINUED] albuterol (VENTOLIN HFA) 108 (90 Base) MCG/ACT inhaler Inhale 2 puffs by mouth every 6 hours as needed for wheezing or shortness of breath.     Allergies:   Tramadol hcl, Other, Codeine, Tape, and Vicodin [hydrocodone-acetaminophen]   Social History   Socioeconomic History   Marital status: Widowed    Spouse name: Not on file   Number of children: 1    Years of education: Not on file   Highest education level: Not on file  Occupational History   Occupation: currently unemployed, former [employer] Engineer, manufacturing systems  Tobacco Use   Smoking status: Former    Packs/day: 1.50    Years: 30.00    Additional pack years: 0.00    Total pack years: 45.00    Types: Cigarettes    Quit date: 04/21/2003    Years since quitting: 19.2   Smokeless tobacco: Never  Vaping Use   Vaping Use: Never used  Substance and Sexual Activity   Alcohol use: Yes    Alcohol/week: 2.0 standard drinks of alcohol    Types: 2 Standard drinks or equivalent per week    Comment: OCCAS   Drug use: No   Sexual activity: Not Currently    Birth control/protection: None  Other Topics Concern   Not on file  Social History Narrative   3 caffeine drinks daily    Social Determinants of Health   Financial Resource Strain: Low Risk  (01/16/2022)   Overall Financial Resource Strain (CARDIA)    Difficulty of Paying Living Expenses: Not hard at all  Food Insecurity: No Food Insecurity (04/30/2022)   Hunger Vital Sign    Worried About Running Out of Food in the Last Year: Never true    Ran Out of Food in the Last Year: Never true  Transportation Needs: No Transportation Needs (04/30/2022)   PRAPARE - Hydrologist (Medical): No    Lack of Transportation (Non-Medical): No  Physical Activity: Inactive (01/16/2022)   Exercise Vital Sign    Days of Exercise per Week: 0 days    Minutes of Exercise per Session: 0 min  Stress: No Stress Concern Present (01/16/2022)   [place]    Feeling of Stress : Not at all  Social Connections: Socially Isolated (01/16/2022)   Social Connection and Isolation Panel [NHANES]    Frequency of Communication with Friends and Family: More than three times a week    Frequency of  Social Gatherings with Friends and Family: Once a week    Attends Religious Services:  Never    Marine scientist or Organizations: No    Attends Archivist Meetings: Never    Marital Status: Widowed     Family History: The patient's family history includes Colon polyps in her mother; Diabetes in her mother; Hyperlipidemia in her mother; Hypertension in her mother; Stroke in her father. There is no history of Colon cancer.  ROS:   Please see the history of present illness.    2+ pitting edema bilateral LE All other systems reviewed and are negative.  Labs/Other Studies Reviewed:    The following studies were reviewed today:  Cardiac Studies & Procedures       ECHOCARDIOGRAM  ECHOCARDIOGRAM LIMITED 05/22/2020  Sonographer Comments: Patient is morbidly obese. Image acquisition challenging due to patient body habitus and Image acquisition challenging due to COPD. COVID+ IMPRESSIONS   1. Left ventricular ejection fraction, by estimation, is 50 to 55%. The left ventricle has low normal function. Left ventricular endocardial border not optimally defined to evaluate regional wall motion. There is mild left ventricular hypertrophy. Left ventricular diastolic parameters are indeterminate. 2. Right ventricle is not well visualized but grossly normal size and systolic function. The estimated right ventricular systolic pressure is 99991111 mmHg. 3. The mitral valve is normal in structure. No evidence of mitral valve regurgitation. 4. The aortic valve was not well visualized. Aortic valve regurgitation is not visualized. 5. The inferior vena cava is dilated in size with >50% respiratory variability, suggesting right atrial pressure of 8 mmHg.  FINDINGS Left Ventricle: Left ventricular ejection fraction, by estimation, is 50 to 55%. The left ventricle has low normal function. Left ventricular endocardial border not optimally defined to evaluate regional wall motion. The left ventricular internal cavity size was normal in size. There is mild left ventricular  hypertrophy. Left ventricular diastolic parameters are indeterminate.  Right Ventricle: The right ventricular size is normal. Right ventricular systolic function is normal. There is normal pulmonary artery systolic pressure. The tricuspid regurgitant velocity is 2.57 m/s, and with an assumed right atrial pressure of 8 mmHg, the estimated right ventricular systolic pressure is 99991111 mmHg.  Pericardium: There is no evidence of pericardial effusion.  Mitral Valve: The mitral valve is normal in structure.  Tricuspid Valve: The tricuspid valve is normal in structure. Tricuspid valve regurgitation is trivial.  Aortic Valve: The aortic valve was not well visualized. Aortic valve regurgitation is not visualized.  Pulmonic Valve: The pulmonic valve was not well visualized. Pulmonic valve regurgitation is not visualized.  Aorta: The aortic root is normal in size and structure.  Venous: The inferior vena cava is dilated in size with greater than 50% respiratory variability, suggesting right atrial pressure of 8 mmHg.  IAS/Shunts: The interatrial septum was not well visualized.              Recent Labs: 02/04/2022: Magnesium 1.3; TSH 7.01 04/20/2022: ALT 13 04/21/2022: B Natriuretic Peptide 365.6 04/22/2022: Hemoglobin 11.7; Platelets 304 04/25/2022: BUN 28; Creatinine, Ser 1.14; Potassium 3.5; Sodium 133  Recent Lipid Panel    Component Value Date/Time   CHOL 100 02/04/2022 1523   TRIG 126.0 02/04/2022 1523   HDL 42.90 02/04/2022 1523   CHOLHDL 2 02/04/2022 1523   VLDL 25.2 02/04/2022 1523   LDLCALC 32 02/04/2022 1523   LDLDIRECT 111.0 07/09/2020 1441     Risk Assessment/Calculations:    CHA2DS2-VASc Score = 7   This indicates  a 11.2% annual risk of stroke. The patient's score is based upon: CHF History: 1 HTN History: 1 Diabetes History: 1 Stroke History: 2 Vascular Disease History: 0 Age Score: 1 Gender Score: 1    Physical Exam:    VS:  BP 116/72   Pulse 78   Ht 5\' 2"   (1.575 m)   Wt 255 lb 12.8 oz (116 kg)   SpO2 98%   BMI 46.79 kg/m     Wt Readings from Last 3 Encounters:  07/06/22 255 lb 12.8 oz (116 kg)  05/13/22 254 lb (115.2 kg)  04/20/22 268 lb 1.3 oz (121.6 kg)     GEN: Obese, well developed in no acute distress HEENT: Normal NECK: No JVD; No carotid bruits CARDIAC: Irregular RR, no murmurs, rubs, gallops RESPIRATORY:  Clear to auscultation without rales, wheezing or rhonchi  ABDOMEN: Soft, non-tender, non-distended MUSCULOSKELETAL:  No edema; No deformity. 2+ pedal pulses, equal bilaterally SKIN: Warm and dry NEUROLOGIC:  Alert and oriented x 3 PSYCHIATRIC:  Normal affect   EKG:  EKG is not ordered today.   Diagnoses:    1. Longstanding persistent atrial fibrillation ([place])   2. Chronic anticoagulation   3. Chronic diastolic heart failure ([place])   4. Essential hypertension   5. Bilateral leg edema   6. Class 3 severe obesity due to excess calories with serious comorbidity and body mass index (BMI) of 45.0 to 49.9 in adult [facility])    Assessment and Plan:     Longstanding atrial fibrillation on chronic anticoagulation: Remains asymptomatic with HR well controlled. No bleeding concerns. Continue Eliquis 5 mg twice daily which is appropriate dose for stroke prevention and metoprolol for rate control.   Chronic HFpEF/Leg edema: LVEF 50-55%, indeterminate diastolic parameters on 123456. 2+ pitting edema bilateral LE for which she has increased Lasix x 3 days with some improvement. No dyspnea, orthopnea, PND. Encouraged leg compression and elevation. Low sodium diet.  Increase physical activity to achieve 150 minutes of moderate intensity exercise each week.  Hypertension: BP is well controlled. Renal function is stable on lab results 04/25/2022.   Obesity: Encouraged weight loss. 150 minutes of moderate intensity exercise each week and heart healthy, mostly plant-based diet.    Disposition: 1 year with Dr. Johnsie Cancel  Medication  Adjustments/Labs and Tests Ordered: Current medicines are reviewed at length with the patient today.  Concerns regarding medicines are outlined above.  No orders of the defined types were placed in this encounter.  No orders of the defined types were placed in this encounter.   Patient Instructions  Medication Instructions:  Your physician recommends that you continue on your current medications as directed. Please refer to the Current Medication list given to you today.  *If you need a refill on your cardiac medications before your next appointment, please call your pharmacy*   Lab Work: None ordered  If you have labs (blood work) drawn today and your tests are completely normal, you will receive your results only by: [place] (if you have MyChart) OR A paper copy in the mail If you have any lab test that is abnormal or we need to change your treatment, we will call you to review the results.   Testing/Procedures: None ordered   Follow-Up: At [facility], you and your health needs are our priority.  As part of our continuing mission to provide you with exceptional heart care, we have created designated Provider Care Teams.  These Care Teams include your primary Cardiologist (physician)  and Advanced Practice Providers (APPs -  Physician Assistants and Nurse Practitioners) who all work together to provide you with the care you need, when you need it.  We recommend signing up for the patient portal called "MyChart".  Sign up information is provided on this After Visit Summary.  MyChart is used to connect with patients for Virtual Visits (Telemedicine).  Patients are able to view lab/test results, encounter notes, upcoming appointments, etc.  Non-urgent messages can be sent to your provider as well.   To learn more about what you can do with MyChart, go to [email or web address].    Your next appointment:   1 year(s)  Provider:   Jenkins Rouge, MD     Other  Instructions   For your  leg edema you  should do  the following 1. Leg elevation - I recommend the Lounge Dr. Leg rest.  See below for details  2. Salt restriction  -  Use potassium chloride instead of regular salt as a salt substitute. 3. Walk regularly 4. Compression hose - Medical Supply store  5. Weight loss    Available on [email or web address] Or  Go to [email or web address]       Signed, Emmaline Life, NP  07/06/2022 6:49 PM    [place]

## 2022-07-06 ENCOUNTER — Encounter: Payer: Self-pay | Admitting: Nurse Practitioner

## 2022-07-06 ENCOUNTER — Ambulatory Visit: Payer: Medicare Other | Attending: Cardiovascular Disease | Admitting: Nurse Practitioner

## 2022-07-06 VITALS — BP 116/72 | HR 78 | Ht 62.0 in | Wt 255.8 lb

## 2022-07-06 DIAGNOSIS — Z7901 Long term (current) use of anticoagulants: Secondary | ICD-10-CM | POA: Diagnosis not present

## 2022-07-06 DIAGNOSIS — I4811 Longstanding persistent atrial fibrillation: Secondary | ICD-10-CM

## 2022-07-06 DIAGNOSIS — R600 Localized edema: Secondary | ICD-10-CM

## 2022-07-06 DIAGNOSIS — I5032 Chronic diastolic (congestive) heart failure: Secondary | ICD-10-CM | POA: Diagnosis not present

## 2022-07-06 DIAGNOSIS — I10 Essential (primary) hypertension: Secondary | ICD-10-CM | POA: Diagnosis not present

## 2022-07-06 DIAGNOSIS — Z6841 Body Mass Index (BMI) 40.0 and over, adult: Secondary | ICD-10-CM

## 2022-07-06 NOTE — Patient Instructions (Signed)
Medication Instructions:  Your physician recommends that you continue on your current medications as directed. Please refer to the Current Medication list given to you today.  *If you need a refill on your cardiac medications before your next appointment, please call your pharmacy*   Lab Work: None ordered  If you have labs (blood work) drawn today and your tests are completely normal, you will receive your results only by: [place] (if you have MyChart) OR A paper copy in the mail If you have any lab test that is abnormal or we need to change your treatment, we will call you to review the results.   Testing/Procedures: None ordered   Follow-Up: At [facility], you and your health needs are our priority.  As part of our continuing mission to provide you with exceptional heart care, we have created designated Provider Care Teams.  These Care Teams include your primary Cardiologist (physician) and Advanced Practice Providers (APPs -  Physician Assistants and Nurse Practitioners) who all work together to provide you with the care you need, when you need it.  We recommend signing up for the patient portal called "MyChart".  Sign up information is provided on this After Visit Summary.  MyChart is used to connect with patients for Virtual Visits (Telemedicine).  Patients are able to view lab/test results, encounter notes, upcoming appointments, etc.  Non-urgent messages can be sent to your provider as well.   To learn more about what you can do with MyChart, go to [email or web address].    Your next appointment:   1 year(s)  Provider:   Jenkins Rouge, MD     Other Instructions   For your  leg edema you  should do  the following 1. Leg elevation - I recommend the Lounge Dr. Leg rest.  See below for details  2. Salt restriction  -  Use potassium chloride instead of regular salt as a salt substitute. 3. Walk regularly 4. Compression hose - Medical Supply store  5.  Weight loss    Available on [email or web address] Or  Go to [email or web address]

## 2022-07-21 ENCOUNTER — Ambulatory Visit: Payer: Medicare Other | Admitting: Internal Medicine

## 2022-07-24 ENCOUNTER — Ambulatory Visit (INDEPENDENT_AMBULATORY_CARE_PROVIDER_SITE_OTHER): Payer: Medicare Other | Admitting: Internal Medicine

## 2022-07-24 ENCOUNTER — Other Ambulatory Visit: Payer: Self-pay | Admitting: Cardiovascular Disease

## 2022-07-24 ENCOUNTER — Encounter: Payer: Self-pay | Admitting: Internal Medicine

## 2022-07-24 VITALS — BP 118/72 | HR 67 | Temp 98.1°F | Ht 62.0 in | Wt 262.0 lb

## 2022-07-24 DIAGNOSIS — E1165 Type 2 diabetes mellitus with hyperglycemia: Secondary | ICD-10-CM

## 2022-07-24 DIAGNOSIS — E538 Deficiency of other specified B group vitamins: Secondary | ICD-10-CM

## 2022-07-24 DIAGNOSIS — Z0001 Encounter for general adult medical examination with abnormal findings: Secondary | ICD-10-CM

## 2022-07-24 DIAGNOSIS — I10 Essential (primary) hypertension: Secondary | ICD-10-CM

## 2022-07-24 DIAGNOSIS — Z20822 Contact with and (suspected) exposure to covid-19: Secondary | ICD-10-CM

## 2022-07-24 DIAGNOSIS — E039 Hypothyroidism, unspecified: Secondary | ICD-10-CM

## 2022-07-24 DIAGNOSIS — E559 Vitamin D deficiency, unspecified: Secondary | ICD-10-CM | POA: Diagnosis not present

## 2022-07-24 DIAGNOSIS — R300 Dysuria: Secondary | ICD-10-CM

## 2022-07-24 DIAGNOSIS — I4811 Longstanding persistent atrial fibrillation: Secondary | ICD-10-CM

## 2022-07-24 LAB — URINALYSIS, ROUTINE W REFLEX MICROSCOPIC
Bilirubin Urine: NEGATIVE
Ketones, ur: NEGATIVE
Nitrite: NEGATIVE
Specific Gravity, Urine: 1.01 (ref 1.000–1.030)
Total Protein, Urine: NEGATIVE
Urine Glucose: NEGATIVE
Urobilinogen, UA: 0.2 (ref 0.0–1.0)
pH: 5.5 (ref 5.0–8.0)

## 2022-07-24 LAB — CBC WITH DIFFERENTIAL/PLATELET
Basophils Absolute: 0.1 10*3/uL (ref 0.0–0.1)
Basophils Relative: 0.8 % (ref 0.0–3.0)
Eosinophils Absolute: 0.3 10*3/uL (ref 0.0–0.7)
Eosinophils Relative: 3.3 % (ref 0.0–5.0)
HCT: 34.7 % — ABNORMAL LOW (ref 36.0–46.0)
Hemoglobin: 11.1 g/dL — ABNORMAL LOW (ref 12.0–15.0)
Lymphocytes Relative: 27.9 % (ref 12.0–46.0)
Lymphs Abs: 2.7 10*3/uL (ref 0.7–4.0)
MCHC: 32 g/dL (ref 30.0–36.0)
MCV: 86.2 fl (ref 78.0–100.0)
Monocytes Absolute: 0.5 10*3/uL (ref 0.1–1.0)
Monocytes Relative: 5.2 % (ref 3.0–12.0)
Neutro Abs: 6 10*3/uL (ref 1.4–7.7)
Neutrophils Relative %: 62.8 % (ref 43.0–77.0)
Platelets: 326 10*3/uL (ref 150.0–400.0)
RBC: 4.03 Mil/uL (ref 3.87–5.11)
RDW: 16.7 % — ABNORMAL HIGH (ref 11.5–15.5)
WBC: 9.6 10*3/uL (ref 4.0–10.5)

## 2022-07-24 LAB — VITAMIN D 25 HYDROXY (VIT D DEFICIENCY, FRACTURES): VITD: 24.31 ng/mL — ABNORMAL LOW (ref 30.00–100.00)

## 2022-07-24 LAB — HEPATIC FUNCTION PANEL
ALT: 10 U/L (ref 0–35)
AST: 16 U/L (ref 0–37)
Albumin: 3.9 g/dL (ref 3.5–5.2)
Alkaline Phosphatase: 74 U/L (ref 39–117)
Bilirubin, Direct: 0.1 mg/dL (ref 0.0–0.3)
Total Bilirubin: 0.6 mg/dL (ref 0.2–1.2)
Total Protein: 6.9 g/dL (ref 6.0–8.3)

## 2022-07-24 LAB — MICROALBUMIN / CREATININE URINE RATIO
Creatinine,U: 21.5 mg/dL
Microalb Creat Ratio: 28.6 mg/g (ref 0.0–30.0)
Microalb, Ur: 6.2 mg/dL — ABNORMAL HIGH (ref 0.0–1.9)

## 2022-07-24 LAB — LIPID PANEL
Cholesterol: 136 mg/dL (ref 0–200)
HDL: 49.7 mg/dL (ref >39.00)
LDL Cholesterol: 54 mg/dL (ref 0–99)
NonHDL: 85.87
Total CHOL/HDL Ratio: 3
Triglycerides: 161 mg/dL — ABNORMAL HIGH (ref 0.0–149.0)
VLDL: 32.2 mg/dL (ref 0.0–40.0)

## 2022-07-24 LAB — BASIC METABOLIC PANEL
BUN: 17 mg/dL (ref 6–23)
CO2: 27 mEq/L (ref 19–32)
Calcium: 8.9 mg/dL (ref 8.4–10.5)
Chloride: 101 mEq/L (ref 96–112)
Creatinine, Ser: 1.04 mg/dL (ref 0.40–1.20)
GFR: 54.82 mL/min — ABNORMAL LOW (ref >60.00)
Glucose, Bld: 139 mg/dL — ABNORMAL HIGH (ref 70–99)
Potassium: 3.9 mEq/L (ref 3.5–5.1)
Sodium: 142 mEq/L (ref 135–145)

## 2022-07-24 LAB — HEMOGLOBIN A1C: Hgb A1c MFr Bld: 6.7 % — ABNORMAL HIGH (ref 4.6–6.5)

## 2022-07-24 LAB — VITAMIN B12: Vitamin B-12: 442 pg/mL (ref 211–911)

## 2022-07-24 LAB — TSH: TSH: 3.81 u[IU]/mL (ref 0.35–5.50)

## 2022-07-24 LAB — POC SOFIA SARS ANTIGEN FIA: SARS Coronavirus 2 Ag: NEGATIVE

## 2022-07-24 MED ORDER — CIPROFLOXACIN HCL 500 MG PO TABS: 500.0000 mg | ORAL_TABLET | Freq: Two times a day (BID) | ORAL | 0 refills | Status: AC

## 2022-07-24 NOTE — Patient Instructions (Signed)
Your Covid testing today is negative  Please take all new medication as prescribed - the antibiotic for the probable UTI  Please continue all other medications as before, and refills have been done if requested.  Please have the pharmacy call with any other refills you may need.  Please continue your efforts at being more active, low cholesterol diet, and weight control.  You are otherwise up to date with prevention measures today.  Please keep your appointments with your specialists as you may have planned  Please go to the LAB at the blood drawing area for the tests to be done  You will be contacted by phone if any changes need to be made immediately.  Otherwise, you will receive a letter about your results with an explanation, but please check with MyChart first.  Please remember to sign up for MyChart if you have not done so, as this will be important to you in the future with finding out test results, communicating by private email, and scheduling acute appointments online when needed.  Please make an Appointment to return in 6 months, or sooner if needed  OK to cancel the Apr 19 appt

## 2022-07-24 NOTE — Telephone Encounter (Signed)
Prescription refill request for Eliquis received. Indication: Afib  Last office visit: 07/06/22 ([place])  Scr: 1.06 (04/24/22)  Age: 72 Weight: 116kg  Appropriate dose. Refill sent.

## 2022-07-24 NOTE — Progress Notes (Unsigned)
Patient ID: Ashby DawesGloria G Bobak, female   DOB: 19-May-1950, 72 y.o.   MRN: [id number]         Chief Complaint:: wellness exam and Possible UTI (Blood during urination)  , recent lower stamina - fatigue, coid exposure, dm, htn, low thyroid, low vit d       HPI:  Ashby DawesGloria G Maqueda is a 72 y.o. female here for wellness exam; for GYN exam and mammograma and DXA apr 17, for shingrix and tdap at pharmacy, declines colonoscopy for now, o/w up to date                        Also with 3 days onset dysuria, frequency, and gross hematuiria last pm, assoc with reduced stamina, fatigue, but Denies urinary symptoms such as urgency, flank pain, hematuria or n/v, fever, chills.  Pt denies chest pain, increased sob or doe, wheezing, orthopnea, PND, increased LE swelling, palpitations, dizziness or syncope.   Pt denies polydipsia, polyuria, or new focal neuro s/s.    Pt denies fever, wt loss, night sweats, loss of appetite, or other constitutional symptoms  Pt currently has a family member with covid living in her home, but pt herself denies uri symptoms or cough.  Denies hyper or hypo thyroid symptoms such as voice, skin or hair change.     Wt Readings from Last 3 Encounters:  07/24/22 262 lb (118.8 kg)  07/06/22 255 lb 12.8 oz (116 kg)  05/13/22 254 lb (115.2 kg)   BP Readings from Last 3 Encounters:  07/24/22 118/72  07/06/22 116/72  05/13/22 118/64   Immunization History  Administered Date(s) Administered   Fluad Quad(high Dose 65+) 02/09/2019, 06/11/2020, 01/09/2021, 02/04/2022   Influenza, High Dose Seasonal PF 03/23/2018   Influenza,inj,Quad PF,6+ Mos 03/01/2013, 02/14/2014, 03/08/2015, 04/16/2016, 01/12/2017   Influenza-Unspecified 01/18/2017   Pneumococcal Conjugate-13 03/30/2013   Pneumococcal Polysaccharide-23 04/04/2014, 07/09/2020   Tdap 09/24/2010   Health Maintenance Due  Topic Date Due   Zoster Vaccines- Shingrix (1 of 2) Never done   MAMMOGRAM  03/04/2018   DTaP/Tdap/Td (2 - Td or Tdap)  09/23/2020      Past Medical History:  Diagnosis Date   ALLERGIC RHINITIS 08/16/2008   ANXIETY 08/16/2008   Arthritis    hands, knees, lower back   ASTHMA 08/16/2008   Asthma    BRONCHITIS     DR. Delton CoombesBYRUM     Bladder leak    Cancer    MELANOMA       Chronic lower back pain    problems with disc L2-5   COPD 08/16/2008   DEPRESSION 08/16/2008   Diabetes mellitus without complication    Dysrhythmia    hx AF   Excessive daytime sleepiness 06/19/2015   GENITAL HERPES 12/03/2006   GERD (gastroesophageal reflux disease)    H/O hiatal hernia    HEMATOCHEZIA 06/20/2009   Hemorrhoids    History of cardioversion 10/30/14   History of kidney stones    HYPERLIPIDEMIA 08/16/2008   HYPERTENSION 12/03/2006   Hypertension    Impaired glucose tolerance 09/21/2010   Overactive bladder 10/20/2015   Pneumonia    as a child   Pulmonary HTN 06/19/2015   Shortness of breath dyspnea    Sleep apnea    MILD NO CPAP ORDERED   Snoring 06/19/2015   Past Surgical History:  Procedure Laterality Date   ABDOMINAL HYSTERECTOMY     APPENDECTOMY     CARDIOVERSION N/A 03/20/2014   Procedure: CARDIOVERSION;  Surgeon: Wendall Stade, MD;  Location: [facility] ENDOSCOPY;  Service: Cardiovascular;  Laterality: N/A;   CARDIOVERSION N/A 10/30/2014   Procedure: CARDIOVERSION;  Surgeon: Wendall Stade, MD;  Location: [facility] ENDOSCOPY;  Service: Cardiovascular;  Laterality: N/A;   CARDIOVERSION N/A 04/03/2019   Procedure: CARDIOVERSION;  Surgeon: Lars Masson, MD;  Location: [facility] ENDOSCOPY;  Service: Cardiovascular;  Laterality: N/A;   CARPAL TUNNEL RELEASE     Right + LEFT   CESAREAN SECTION     x 1    COLONOSCOPY  2004   CYST EXCISION     RT ARM    CYSTOCELE REPAIR N/A 10/25/2012   Procedure: ANTERIOR REPAIR (CYSTOCELE);  Surgeon: Miguel Aschoff, MD;  Location: [facility] ORS;  Service: Gynecology;  Laterality: N/A;   HEEL SPUR SURGERY Bilateral    KNEE SURGERY     Right + LEFT   LUMBAR LAMINECTOMY/DECOMPRESSION MICRODISCECTOMY N/A  03/19/2016   Procedure: Laminectomy and Foraminotomy - Lumbar three-four - Lumbar four-five;  Surgeon: Tia Alert, MD;  Location: [facility] OR;  Service: Neurosurgery;  Laterality: N/A;   RADIOLOGY WITH ANESTHESIA Right 11/01/2014   Procedure: MRI RIGHT FOREARM;  Surgeon: Medication Radiologist, MD;  Location: [facility] OR;  Service: Radiology;  Laterality: Right;   RADIOLOGY WITH ANESTHESIA Right 08/29/2015   Procedure: MRI - RIGHT FOREARM;  Surgeon: Medication Radiologist, MD;  Location: [facility] OR;  Service: Radiology;  Laterality: Right;   RADIOLOGY WITH ANESTHESIA N/A 12/05/2015   Procedure: MRI SPINE WITHOUT;  Surgeon: Medication Radiologist, MD;  Location: [facility] OR;  Service: Radiology;  Laterality: N/A;   SKIN CANCER EXCISION     BILAT SHOULDERS   SPLIT NIGHT STUDY  09/04/2015   TONSILLECTOMY AND ADENOIDECTOMY      reports that she quit smoking about 19 years ago. Her smoking use included cigarettes. She has a 45.00 pack-year smoking history. She has never used smokeless tobacco. She reports current alcohol use of about 2.0 standard drinks of alcohol per week. She reports that she does not use drugs. family history includes Colon polyps in her mother; Diabetes in her mother; Hyperlipidemia in her mother; Hypertension in her mother; Stroke in her father. Allergies  Allergen Reactions   Tramadol Hcl Nausea And Vomiting and Other (See Comments)     mouth dryness, headache   Other Nausea Only   Codeine Nausea And Vomiting and Nausea Only   Tape Rash    Plastic tape, bandaids and ekg leads, causes redness and rash Other reaction(s): Not available   Vicodin [Hydrocodone-Acetaminophen] Nausea And Vomiting   Current Outpatient Medications on File Prior to Visit  Medication Sig Dispense Refill   acetaminophen (TYLENOL) 500 MG tablet Take 500 mg by mouth 2 (two) times daily.     albuterol (PROVENTIL) (2.5 MG/3ML) 0.083% nebulizer solution USE EVERY 4 HOURS WITH IPRATROPIUM 300 mL 12   amLODipine (NORVASC) 10 MG  tablet Take 1 tablet (10 mg total) by mouth daily. 90 tablet 3   Blood Glucose Monitoring Suppl (FREESTYLE LITE) DEVI Use as directed daily E11.9 1 each 0   Budeson-Glycopyrrol-Formoterol (BREZTRI AEROSPHERE) 160-9-4.8 MCG/ACT AERO Inhale 2 puffs into the lungs 2 (two) times daily. 10.7 g 11   cetirizine (ZYRTEC) 10 MG tablet Take 10 mg by mouth daily.     Cholecalciferol 50 MCG (2000 UT) TABS 1 tab by mouth once daily 30 tablet 99   cyclobenzaprine (FLEXERIL) 5 MG tablet      dimenhyDRINATE (DRAMAMINE) 50 MG tablet Take by mouth every 8 (eight) hours  as needed.     escitalopram (LEXAPRO) 20 MG tablet Take 1 tablet (20 mg total) by mouth daily. (Patient taking differently: Take 20 mg by mouth at bedtime.) 90 tablet 3   estradiol (ESTRACE) 1 MG tablet Take 1 mg by mouth daily.     fluorouracil (EFUDEX) 5 % cream Apply topically.     furosemide (LASIX) 40 MG tablet Take 1 tablet (40 mg total) by mouth daily. 90 tablet 3   glucose blood (FREESTYLE LITE) test strip Use as instructed once daily E11.9 100 each 12   GUAIFENESIN ER PO Take 600 mg by mouth daily as needed for cough.     ipratropium (ATROVENT) 0.02 % nebulizer solution INHALE 1 VIAL IN NEBULIZER 4 TIMES DAILY WITH  ALBUTEROL 300 mL 11   ipratropium-albuterol (DUONEB) 0.5-2.5 (3) MG/3ML SOLN USE 1 AMPULE IN NEBULIZER 4 TIMES DAILY 360 mL 5   irbesartan (AVAPRO) 150 MG tablet Take 1 tablet (150 mg total) by mouth daily. 90 tablet 3   ketoconazole (NIZORAL) 2 % cream      Lancets MISC Use as directed once daily E11.9 100 each 11   levothyroxine (SYNTHROID) 50 MCG tablet Take 1 tablet (50 mcg total) by mouth daily. 90 tablet 3   LORazepam (ATIVAN) 1 MG tablet Take 1 tablet (1 mg total) by mouth 2 (two) times daily as needed. for anxiety 60 tablet 2   Magnesium Oxide -Mg Supplement 500 MG CAPS 1 tab by mouth twice per day (Patient taking differently: Take 1 capsule by mouth in the morning. 1 tab by mouth twice per day) 60 capsule 11    metFORMIN (GLUCOPHAGE-XR) 500 MG 24 hr tablet TAKE 2 TABLETS BY MOUTH ONCE DAILY WITH BREAKFAST 180 tablet 3   metoprolol tartrate (LOPRESSOR) 100 MG tablet Take 1 tablet by mouth twice daily (Patient taking differently: Take 100 mg by mouth 2 (two) times daily.) 180 tablet 3   pantoprazole (PROTONIX) 40 MG tablet Take 1 tablet by mouth daily.     potassium chloride SA (KLOR-CON) 20 MEQ tablet Take 1 tablet (20 mEq total) by mouth daily. 90 tablet 3   predniSONE (DELTASONE) 10 MG tablet Take 40 mg daily for 1 day, 30 mg daily for 1 day, 20 mg daily for 1 days,10 mg daily for 1 day, then stop 10 tablet 0   promethazine-dextromethorphan (PROMETHAZINE-DM) 6.25-15 MG/5ML syrup      rosuvastatin (CRESTOR) 40 MG tablet Take 1 tablet (40 mg total) by mouth daily. 90 tablet 0   TETRAHYDROZOLINE HCL OP Apply to eye.     tiZANidine (ZANAFLEX) 4 MG tablet Take 1 tablet (4 mg total) by mouth every 6 (six) hours as needed for muscle spasms. 60 tablet 2   tolterodine (DETROL LA) 4 MG 24 hr capsule Take 1 capsule (4 mg total) by mouth daily. 90 capsule 3   vitamin E 180 MG (400 UNITS) capsule Take by mouth.     doxycycline (VIBRA-TABS) 100 MG tablet Take 1 tablet (100 mg total) by mouth 2 (two) times daily. (Patient not taking: Reported on 07/06/2022) 14 tablet 0   No current facility-administered medications on file prior to visit.        ROS:  All others reviewed and negative.  Objective        PE:  BP 118/72   Pulse 67   Temp 98.1 F (36.7 C) (Oral)   Ht 5\' 2"  (1.575 m)   Wt 262 lb (118.8 kg)   SpO2 98%  BMI 47.92 kg/m                 Constitutional: Pt appears in NAD               HENT: Head: NCAT.                Right Ear: External ear normal.                 Left Ear: External ear normal.                Eyes: . Pupils are equal, round, and reactive to light. Conjunctivae and EOM are normal               Nose: without d/c or deformity               Neck: Neck supple. Gross normal ROM                Cardiovascular: Normal rate and regular rhythm.                 Pulmonary/Chest: Effort normal and breath sounds without rales or wheezing.                Abd:  Soft, NT, ND, + BS, no organomegaly               Neurological: Pt is alert. At baseline orientation, motor grossly intact               Skin: Skin is warm. No rashes, no other new lesions, LE edema - none               Psychiatric: Pt behavior is normal without agitation   Micro: none  Cardiac tracings I have personally interpreted today:  none  Pertinent Radiological findings (summarize): none   Lab Results  Component Value Date   WBC 9.6 07/24/2022   HGB 11.1 (L) 07/24/2022   HCT 34.7 (L) 07/24/2022   PLT 326.0 07/24/2022   GLUCOSE 139 (H) 07/24/2022   CHOL 136 07/24/2022   TRIG 161.0 (H) 07/24/2022   HDL 49.70 07/24/2022   LDLDIRECT 111.0 07/09/2020   LDLCALC 54 07/24/2022   ALT 10 07/24/2022   AST 16 07/24/2022   NA 142 07/24/2022   K 3.9 07/24/2022   CL 101 07/24/2022   CREATININE 1.04 07/24/2022   BUN 17 07/24/2022   CO2 27 07/24/2022   TSH 3.81 07/24/2022   INR 1.2 04/20/2022   HGBA1C 6.7 (H) 07/24/2022   MICROALBUR 6.2 (H) 07/24/2022   POCT - COVID - neg  Assessment/Plan:  CARLYN LEMKE is a 72 y.o. White or Caucasian [1] female with  has a past medical history of ALLERGIC RHINITIS (08/16/2008), ANXIETY (08/16/2008), Arthritis, ASTHMA (08/16/2008), Asthma, Bladder leak, Cancer, Chronic lower back pain, COPD (08/16/2008), DEPRESSION (08/16/2008), Diabetes mellitus without complication, Dysrhythmia, Excessive daytime sleepiness (06/19/2015), GENITAL HERPES (12/03/2006), GERD (gastroesophageal reflux disease), H/O hiatal hernia, HEMATOCHEZIA (06/20/2009), Hemorrhoids, History of cardioversion (10/30/14), History of kidney stones, HYPERLIPIDEMIA (08/16/2008), HYPERTENSION (12/03/2006), Hypertension, Impaired glucose tolerance (09/21/2010), Overactive bladder (10/20/2015), Pneumonia, Pulmonary HTN (06/19/2015), Shortness of  breath dyspnea, Sleep apnea, and Snoring (06/19/2015).  Encounter for well adult exam with abnormal findings Age and sex appropriate education and counseling updated with regular exercise and diet Referrals for preventative services - has scheduled pap and mammogram and dxa for apr 17 Immunizations addressed - for shingrix and tdap at pharmacy Smoking counseling  - none needed Evidence for depression  or other mood disorder - none significant Most recent labs reviewed. I have personally reviewed and have noted: 1) the patient's medical and social history 2) The patient's current medications and supplements 3) The patient's height, weight, and BMI have been recorded in the chart   Diabetes [facility](HCC) Lab Results  Component Value Date   HGBA1C 6.7 (H) 07/24/2022   Stable, pt to continue current medical treatment metfomrin ER 500 mg - 2 qd   Dysuria Probable uti - for urine culture, empiric cipro course 500 bid, and consider urology for gross hematuria if culture is negative  Essential hypertension BP Readings from Last 3 Encounters:  07/24/22 118/72  07/06/22 116/72  05/13/22 118/64   Stable, pt to continue medical treatment norvasc 10 mg qd, avapro 150 mg qd, lopressor 100  bid   Vitamin D deficiency Last vitamin D Lab Results  Component Value Date   VD25OH 24.31 (L) 07/24/2022   Low, to start oral replacement   Hypothyroidism Lab Results  Component Value Date   TSH 3.81 07/24/2022   Stable, pt to continue levothyroxine 50 mcg qd   Exposure to COVID-19 virus Covid negative,  to f/u any worsening symptoms or concerns Followup: Return in about 6 months (around 01/23/2023).  Oliver BarreJames Ladarien Beeks, MD 07/26/2022 6:59 PM [facility]

## 2022-07-26 ENCOUNTER — Encounter: Payer: Self-pay | Admitting: Internal Medicine

## 2022-07-26 DIAGNOSIS — Z20822 Contact with and (suspected) exposure to covid-19: Secondary | ICD-10-CM | POA: Insufficient documentation

## 2022-07-26 LAB — URINE CULTURE

## 2022-07-26 NOTE — Assessment & Plan Note (Signed)
Last vitamin D Lab Results  Component Value Date   VD25OH 24.31 (L) 07/24/2022   Low, to start oral replacement

## 2022-07-26 NOTE — Assessment & Plan Note (Signed)
Lab Results  Component Value Date   TSH 3.81 07/24/2022   Stable, pt to continue levothyroxine 50 mcg qd

## 2022-07-26 NOTE — Assessment & Plan Note (Signed)
Covid negative,  to f/u any worsening symptoms or concerns

## 2022-07-26 NOTE — Assessment & Plan Note (Signed)
Probable uti - for urine culture, empiric cipro course 500 bid, and consider urology for gross hematuria if culture is negative

## 2022-07-26 NOTE — Assessment & Plan Note (Signed)
BP Readings from Last 3 Encounters:  07/24/22 118/72  07/06/22 116/72  05/13/22 118/64   Stable, pt to continue medical treatment norvasc 10 mg qd, avapro 150 mg qd, lopressor 100  bid

## 2022-07-26 NOTE — Assessment & Plan Note (Signed)
Age and sex appropriate education and counseling updated with regular exercise and diet Referrals for preventative services - has scheduled pap and mammogram and dxa for apr 17 Immunizations addressed - for shingrix and tdap at pharmacy Smoking counseling  - none needed Evidence for depression or other mood disorder - none significant Most recent labs reviewed. I have personally reviewed and have noted: 1) the patient's medical and social history 2) The patient's current medications and supplements 3) The patient's height, weight, and BMI have been recorded in the chart

## 2022-07-26 NOTE — Assessment & Plan Note (Signed)
Lab Results  Component Value Date   HGBA1C 6.7 (H) 07/24/2022   Stable, pt to continue current medical treatment metfomrin ER 500 mg - 2 qd

## 2022-08-05 ENCOUNTER — Other Ambulatory Visit: Payer: Self-pay | Admitting: Cardiovascular Disease

## 2022-08-05 DIAGNOSIS — R319 Hematuria, unspecified: Secondary | ICD-10-CM | POA: Diagnosis not present

## 2022-08-05 DIAGNOSIS — Z1231 Encounter for screening mammogram for malignant neoplasm of breast: Secondary | ICD-10-CM | POA: Diagnosis not present

## 2022-08-05 DIAGNOSIS — Z01419 Encounter for gynecological examination (general) (routine) without abnormal findings: Secondary | ICD-10-CM | POA: Diagnosis not present

## 2022-08-05 DIAGNOSIS — N390 Urinary tract infection, site not specified: Secondary | ICD-10-CM | POA: Diagnosis not present

## 2022-08-06 ENCOUNTER — Ambulatory Visit (INDEPENDENT_AMBULATORY_CARE_PROVIDER_SITE_OTHER): Payer: Medicare Other | Admitting: Internal Medicine

## 2022-08-06 ENCOUNTER — Encounter: Payer: Self-pay | Admitting: Internal Medicine

## 2022-08-06 VITALS — BP 130/82 | HR 59 | Temp 98.1°F | Ht 62.0 in | Wt 259.0 lb

## 2022-08-06 DIAGNOSIS — I10 Essential (primary) hypertension: Secondary | ICD-10-CM | POA: Diagnosis not present

## 2022-08-06 DIAGNOSIS — N1831 Chronic kidney disease, stage 3a: Secondary | ICD-10-CM | POA: Diagnosis not present

## 2022-08-06 DIAGNOSIS — E1165 Type 2 diabetes mellitus with hyperglycemia: Secondary | ICD-10-CM

## 2022-08-06 DIAGNOSIS — E559 Vitamin D deficiency, unspecified: Secondary | ICD-10-CM | POA: Diagnosis not present

## 2022-08-06 DIAGNOSIS — R8290 Unspecified abnormal findings in urine: Secondary | ICD-10-CM

## 2022-08-06 NOTE — Patient Instructions (Signed)
Please continue all other medications as before, and refills have been done if requested.  Please have the pharmacy call with any other refills you may need.  Please continue your efforts at being more active, low cholesterol diet, and weight control.  You are otherwise up to date with prevention measures today.  Please keep your appointments with your specialists as you may have planned  You will be contacted regarding the referral for: Nephrology (kidney doctor)

## 2022-08-06 NOTE — Progress Notes (Signed)
Patient ID: Andrea Perry, female   DOB: 08/10/1950, 72 y.o.   MRN: [id number]        Chief Complaint: follow up HTN, low vit d, and hyperglycemia, ckd 3a, abnormal dark urine       HPI:  Andrea Perry is a 72 y.o. female here overall doing ok, Pt denies chest pain, increased sob or doe, wheezing, orthopnea, PND, increased LE swelling, palpitations, dizziness or syncope.   Pt denies polydipsia, polyuria, or new focal neuro s/s.    Pt denies fever, wt loss, night sweats, loss of appetite, or other constitutional symptoms  Denies urinary symptoms such as dysuria, frequency, urgency, flank pain, hematuria or n/v, fever, chills, but has noted recent darker urine but has been drinking less recently.        Wt Readings from Last 3 Encounters:  08/06/22 259 lb (117.5 kg)  07/24/22 262 lb (118.8 kg)  07/06/22 255 lb 12.8 oz (116 kg)   BP Readings from Last 3 Encounters:  08/06/22 130/82  07/24/22 118/72  07/06/22 116/72         Past Medical History:  Diagnosis Date   ALLERGIC RHINITIS 08/16/2008   ANXIETY 08/16/2008   Arthritis    hands, knees, lower back   ASTHMA 08/16/2008   Asthma    BRONCHITIS     DR. 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   GERD (gastroesophageal reflux disease)    H/O hiatal hernia    HEMATOCHEZIA 06/20/2009   Hemorrhoids    History of cardioversion 10/30/14   History of kidney stones    HYPERLIPIDEMIA 08/16/2008   HYPERTENSION 12/03/2006   Hypertension    Impaired glucose tolerance 09/21/2010   Overactive bladder 10/20/2015   Pneumonia    as a child   Pulmonary HTN 06/19/2015   Shortness of breath dyspnea    Sleep apnea    MILD NO CPAP ORDERED   Snoring 06/19/2015   Past Surgical History:  Procedure Laterality Date   ABDOMINAL  HYSTERECTOMY     APPENDECTOMY     CARDIOVERSION N/A 03/20/2014   Procedure: CARDIOVERSION;  Surgeon: Wendall Stade, MD;  Location: [facility] ENDOSCOPY;  Service: Cardiovascular;  Laterality: N/A;   CARDIOVERSION N/A 10/30/2014   Procedure: CARDIOVERSION;  Surgeon: Wendall Stade, MD;  Location: [facility] ENDOSCOPY;  Service: Cardiovascular;  Laterality: N/A;   CARDIOVERSION N/A 04/03/2019   Procedure: CARDIOVERSION;  Surgeon: Lars Masson, MD;  Location: [facility] ENDOSCOPY;  Service: Cardiovascular;  Laterality: N/A;   CARPAL TUNNEL RELEASE     Right + LEFT   CESAREAN SECTION     x 1    COLONOSCOPY  2004   CYST EXCISION     RT ARM    CYSTOCELE REPAIR N/A 10/25/2012   Procedure: ANTERIOR REPAIR (CYSTOCELE);  Surgeon: Miguel Aschoff, MD;  Location: [facility] ORS;  Service: Gynecology;  Laterality: N/A;   HEEL SPUR SURGERY Bilateral    KNEE SURGERY     Right + LEFT   LUMBAR LAMINECTOMY/DECOMPRESSION MICRODISCECTOMY N/A 03/19/2016   Procedure: Laminectomy and Foraminotomy - Lumbar three-four - Lumbar four-five;  Surgeon: Tia Alert, MD;  Location: [facility] OR;  Service: Neurosurgery;  Laterality: N/A;   RADIOLOGY WITH ANESTHESIA Right 11/01/2014   Procedure: MRI RIGHT FOREARM;  Surgeon: Medication Radiologist, MD;  Location: [facility] OR;  Service: Radiology;  Laterality: Right;   RADIOLOGY WITH ANESTHESIA Right 08/29/2015   Procedure: MRI - RIGHT FOREARM;  Surgeon: Medication Radiologist, MD;  Location: [facility] OR;  Service: Radiology;  Laterality: Right;   RADIOLOGY WITH ANESTHESIA N/A 12/05/2015   Procedure: MRI SPINE WITHOUT;  Surgeon: Medication Radiologist, MD;  Location: [facility] OR;  Service: Radiology;  Laterality: N/A;   SKIN CANCER EXCISION     BILAT SHOULDERS   SPLIT NIGHT STUDY  09/04/2015   TONSILLECTOMY AND ADENOIDECTOMY      reports that she quit smoking about 19 years ago. Her smoking use included cigarettes. She has a 45.00 pack-year smoking history. She has never used smokeless tobacco. She reports current alcohol use of  about 2.0 standard drinks of alcohol per week. She reports that she does not use drugs. family history includes Colon polyps in her mother; Diabetes in her mother; Hyperlipidemia in her mother; Hypertension in her mother; Stroke in her father. Allergies  Allergen Reactions   Tramadol Hcl Nausea And Vomiting and Other (See Comments)     mouth dryness, headache   Other Nausea Only   Codeine Nausea And Vomiting and Nausea Only   Tape Rash    Plastic tape, bandaids and ekg leads, causes redness and rash Other reaction(s): Not available   Vicodin [Hydrocodone-Acetaminophen] Nausea And Vomiting   Current Outpatient Medications on File Prior to Visit  Medication Sig Dispense Refill   acetaminophen (TYLENOL) 500 MG tablet Take 500 mg by mouth 2 (two) times daily.     albuterol (PROVENTIL) (2.5 MG/3ML) 0.083% nebulizer solution USE EVERY 4 HOURS WITH IPRATROPIUM 300 mL 12   amLODipine (NORVASC) 10 MG tablet Take 1 tablet (10 mg total) by mouth daily. 90 tablet 3   apixaban (ELIQUIS) 5 MG TABS tablet Take 1 tablet by mouth twice daily 60 tablet 5   Blood Glucose Monitoring Suppl (FREESTYLE LITE) DEVI Use as directed daily E11.9 1 each 0   Budeson-Glycopyrrol-Formoterol (BREZTRI AEROSPHERE) 160-9-4.8 MCG/ACT AERO Inhale 2 puffs into the lungs 2 (two) times daily. 10.7 g 11   cetirizine (ZYRTEC) 10 MG tablet Take 10 mg by mouth daily.     Cholecalciferol 50 MCG (2000 UT) TABS 1 tab by mouth once daily 30 tablet 99   cyclobenzaprine (FLEXERIL) 5 MG tablet      dimenhyDRINATE (DRAMAMINE) 50 MG tablet Take by mouth every 8 (eight) hours as needed.     doxycycline (VIBRA-TABS) 100 MG tablet Take 1 tablet (100 mg total) by mouth 2 (two) times daily. 14 tablet 0   escitalopram (LEXAPRO) 20 MG tablet Take 1 tablet (20 mg total) by mouth daily. (Patient taking differently: Take 20 mg by mouth at bedtime.) 90 tablet 3   estradiol (ESTRACE) 1 MG tablet Take 1 mg by mouth daily.     fluorouracil (EFUDEX) 5 %  cream Apply topically.     furosemide (LASIX) 40 MG tablet Take 1 tablet (40 mg total) by mouth daily. 90 tablet 3   glucose blood (FREESTYLE LITE) test strip Use as instructed once daily E11.9 100 each 12   GUAIFENESIN ER PO Take 600 mg by mouth daily as needed for cough.     ipratropium (ATROVENT) 0.02 % nebulizer solution INHALE 1 VIAL IN NEBULIZER 4 TIMES DAILY WITH  ALBUTEROL 300 mL 11   ipratropium-albuterol (DUONEB) 0.5-2.5 (  3) MG/3ML SOLN USE 1 AMPULE IN NEBULIZER 4 TIMES DAILY 360 mL 5   irbesartan (AVAPRO) 150 MG tablet Take 1 tablet (150 mg total) by mouth daily. 90 tablet 3   ketoconazole (NIZORAL) 2 % cream      Lancets MISC Use as directed once daily E11.9 100 each 11   levothyroxine (SYNTHROID) 50 MCG tablet Take 1 tablet (50 mcg total) by mouth daily. 90 tablet 3   LORazepam (ATIVAN) 1 MG tablet Take 1 tablet (1 mg total) by mouth 2 (two) times daily as needed. for anxiety 60 tablet 2   Magnesium Oxide -Mg Supplement 500 MG CAPS 1 tab by mouth twice per day (Patient taking differently: Take 1 capsule by mouth in the morning. 1 tab by mouth twice per day) 60 capsule 11   metFORMIN (GLUCOPHAGE-XR) 500 MG 24 hr tablet TAKE 2 TABLETS BY MOUTH ONCE DAILY WITH BREAKFAST 180 tablet 3   metoprolol tartrate (LOPRESSOR) 100 MG tablet Take 1 tablet (100 mg total) by mouth 2 (two) times daily. 180 tablet 2   pantoprazole (PROTONIX) 40 MG tablet Take 1 tablet by mouth daily.     potassium chloride SA (KLOR-CON) 20 MEQ tablet Take 1 tablet (20 mEq total) by mouth daily. 90 tablet 3   predniSONE (DELTASONE) 10 MG tablet Take 40 mg daily for 1 day, 30 mg daily for 1 day, 20 mg daily for 1 days,10 mg daily for 1 day, then stop 10 tablet 0   promethazine-dextromethorphan (PROMETHAZINE-DM) 6.25-15 MG/5ML syrup      rosuvastatin (CRESTOR) 40 MG tablet Take 1 tablet (40 mg total) by mouth daily. 90 tablet 0   TETRAHYDROZOLINE HCL OP Apply to eye.     tiZANidine (ZANAFLEX) 4 MG tablet Take 1 tablet (4  mg total) by mouth every 6 (six) hours as needed for muscle spasms. 60 tablet 2   tolterodine (DETROL LA) 4 MG 24 hr capsule Take 1 capsule (4 mg total) by mouth daily. 90 capsule 3   vitamin E 180 MG (400 UNITS) capsule Take by mouth.     No current facility-administered medications on file prior to visit.        ROS:  All others reviewed and negative.  Objective        PE:  BP 130/82 (BP Location: Right Arm, Patient Position: Sitting, Cuff Size: Normal)   Pulse (!) 59   Temp 98.1 F (36.7 C) (Oral)   Ht 5\' 2"  (1.575 m)   Wt 259 lb (117.5 kg)   SpO2 96%   BMI 47.37 kg/m                 Constitutional: Pt appears in NAD               HENT: Head: NCAT.                Right Ear: External ear normal.                 Left Ear: External ear normal.                Eyes: . Pupils are equal, round, and reactive to light. Conjunctivae and EOM are normal               Nose: without d/c or deformity               Neck: Neck supple. Gross normal ROM  Cardiovascular: Normal rate and regular rhythm.                 Pulmonary/Chest: Effort normal and breath sounds without rales or wheezing.                Abd:  Soft, NT, ND, + BS, no organomegaly               Neurological: Pt is alert. At baseline orientation, motor grossly intact               Skin: Skin is warm. No rashes, no other new lesions, LE edema - none               Psychiatric: Pt behavior is normal without agitation   Micro: none  Cardiac tracings I have personally interpreted today:  none  Pertinent Radiological findings (summarize): none   Lab Results  Component Value Date   WBC 9.6 07/24/2022   HGB 11.1 (L) 07/24/2022   HCT 34.7 (L) 07/24/2022   PLT 326.0 07/24/2022   GLUCOSE 139 (H) 07/24/2022   CHOL 136 07/24/2022   TRIG 161.0 (H) 07/24/2022   HDL 49.70 07/24/2022   LDLDIRECT 111.0 07/09/2020   LDLCALC 54 07/24/2022   ALT 10 07/24/2022   AST 16 07/24/2022   NA 142 07/24/2022   K 3.9 07/24/2022    CL 101 07/24/2022   CREATININE 1.04 07/24/2022   BUN 17 07/24/2022   CO2 27 07/24/2022   TSH 3.81 07/24/2022   INR 1.2 04/20/2022   HGBA1C 6.7 (H) 07/24/2022   MICROALBUR 6.2 (H) 07/24/2022   Assessment/Plan:  Andrea Perry is a 72 y.o. White or Caucasian [1] female with  has a past medical history of ALLERGIC RHINITIS (08/16/2008), ANXIETY (08/16/2008), Arthritis, ASTHMA (08/16/2008), Asthma, Bladder leak, Cancer, Chronic lower back pain, COPD (08/16/2008), DEPRESSION (08/16/2008), Diabetes mellitus without complication, Dysrhythmia, Excessive daytime sleepiness (06/19/2015), GENITAL HERPES (12/03/2006), GERD (gastroesophageal reflux disease), H/O hiatal hernia, HEMATOCHEZIA (06/20/2009), Hemorrhoids, History of cardioversion (10/30/14), History of kidney stones, HYPERLIPIDEMIA (08/16/2008), HYPERTENSION (12/03/2006), Hypertension, Impaired glucose tolerance (09/21/2010), Overactive bladder (10/20/2015), Pneumonia, Pulmonary HTN (06/19/2015), Shortness of breath dyspnea, Sleep apnea, and Snoring (06/19/2015).  Abnormal urine Exam benign, pt reassured, declines UA today  CKD stage 3a, GFR 45-59 ml/min Lab Results  Component Value Date   CREATININE 1.04 07/24/2022   Stable overall but chronic persistent, cont to avoid nephrotoxins, refer renal  Diabetes (HCC) Lab Results  Component Value Date   HGBA1C 6.7 (H) 07/24/2022   Stable, pt to continue current medical treatment metformin ER 500 mg - 2 qd   Essential hypertension BP Readings from Last 3 Encounters:  08/06/22 130/82  07/24/22 118/72  07/06/22 116/72   Stable, pt to continue medical treatment norvasc 10 mg qd, avapro 150 mg qd, lopressor 100 bid   Vitamin D deficiency Last vitamin D Lab Results  Component Value Date   VD25OH 24.31 (L) 07/24/2022   Low, to start oral replacement  Followup: Return in about 25 weeks (around 01/28/2023).  Oliver Barre, MD 08/08/2022 4:16 PM [facility]

## 2022-08-07 ENCOUNTER — Ambulatory Visit: Payer: Medicare Other | Admitting: Internal Medicine

## 2022-08-08 ENCOUNTER — Encounter: Payer: Self-pay | Admitting: Internal Medicine

## 2022-08-08 NOTE — Assessment & Plan Note (Signed)
Last vitamin D Lab Results  Component Value Date   VD25OH 24.31 (L) 07/24/2022   Low, to start oral replacement  

## 2022-08-08 NOTE — Assessment & Plan Note (Signed)
Lab Results  Component Value Date   HGBA1C 6.7 (H) 07/24/2022   Stable, pt to continue current medical treatment metformin ER 500 mg - 2 qd

## 2022-08-08 NOTE — Assessment & Plan Note (Signed)
BP Readings from Last 3 Encounters:  08/06/22 130/82  07/24/22 118/72  07/06/22 116/72   Stable, pt to continue medical treatment norvasc 10 mg qd, avapro 150 mg qd, lopressor 100 bid

## 2022-08-08 NOTE — Assessment & Plan Note (Signed)
Exam benign, pt reassured, declines UA today

## 2022-08-08 NOTE — Assessment & Plan Note (Signed)
Lab Results  Component Value Date   CREATININE 1.04 07/24/2022   Stable overall but chronic persistent, cont to avoid nephrotoxins, refer renal

## 2022-09-02 ENCOUNTER — Other Ambulatory Visit: Payer: Self-pay | Admitting: Internal Medicine

## 2022-09-03 ENCOUNTER — Ambulatory Visit: Payer: Medicare Other | Admitting: Emergency Medicine

## 2022-09-06 ENCOUNTER — Other Ambulatory Visit: Payer: Self-pay | Admitting: Internal Medicine

## 2022-09-08 ENCOUNTER — Ambulatory Visit: Payer: Medicare Other | Admitting: Sports Medicine

## 2022-09-10 ENCOUNTER — Ambulatory Visit: Payer: Medicare Other | Admitting: Sports Medicine

## 2022-09-17 ENCOUNTER — Ambulatory Visit: Payer: Medicare Other | Admitting: Sports Medicine

## 2022-09-24 ENCOUNTER — Ambulatory Visit: Payer: Medicare Other | Admitting: Sports Medicine

## 2022-10-02 ENCOUNTER — Ambulatory Visit (INDEPENDENT_AMBULATORY_CARE_PROVIDER_SITE_OTHER): Payer: Medicare Other | Admitting: Emergency Medicine

## 2022-10-02 ENCOUNTER — Encounter: Payer: Self-pay | Admitting: Emergency Medicine

## 2022-10-02 VITALS — BP 128/70 | HR 67 | Ht 62.0 in | Wt 260.0 lb

## 2022-10-02 DIAGNOSIS — J438 Other emphysema: Secondary | ICD-10-CM

## 2022-10-02 MED ORDER — PREDNISONE 10 MG PO TABS: 20.0000 mg | ORAL_TABLET | Freq: Every day | ORAL | 0 refills | Status: AC

## 2022-10-02 MED ORDER — DOXYCYCLINE HYCLATE 100 MG PO TABS: 100.0000 mg | ORAL_TABLET | Freq: Two times a day (BID) | ORAL | 0 refills | Status: DC

## 2022-10-02 NOTE — Addendum Note (Signed)
Addended by: Maurene Capes on: 10/02/2022 11:48 AM   Modules accepted: Orders

## 2022-10-02 NOTE — Patient Instructions (Addendum)
Please take doxycycline 100 mg twice a day for 7 days until completely gone Please take prednisone 20 mg daily for 5 days and then stop Continue Breztri 2 puffs twice a day.  Rinse and gargle after using.  We will confirm that you have refills through your mail order pharmacy for this. Continue your DuoNeb up to every 6 hours if needed for shortness of breath, chest tightness, wheezing.  We will try to find you a nebulizer mouthpiece and tubing Continue your other medications as you have been taking them Follow with Dr Delton Coombes in 6 months or sooner if you have any problems

## 2022-10-02 NOTE — Assessment & Plan Note (Signed)
She feels like she might be coming down with a mild bronchitis, has URI symptoms.  She is not wheezing on exam today.  I think we can avoid prolonged prednisone taper, will give her 20 mg daily for 5 days.  Also treat with doxycycline.  Continue her maintenance medications  Please take doxycycline 100 mg twice a day for 7 days until completely gone Please take prednisone 20 mg daily for 5 days and then stop Continue Breztri 2 puffs twice a day.  Rinse and gargle after using.  We will confirm that you have refills through your mail order pharmacy for this. Continue your DuoNeb up to every 6 hours if needed for shortness of breath, chest tightness, wheezing.  We will try to find you a nebulizer mouthpiece and tubing Continue your other medications as you have been taking them Follow with Dr Delton Coombes in 6 months or sooner if you have any problems

## 2022-10-02 NOTE — Progress Notes (Signed)
Subjective:    Patient ID: Andrea Perry, female    DOB: 08-Dec-1950, 72 y.o.   MRN: [id number]  HPI  ROV 11/05/2021 --follow-up visit 72 year old woman with a history of asthmatic COPD, positive bronchodilator response.  She has allergic rhinitis with mild peripheral eosinophilia.  Past medical history also significant for atrial fibrillation, hypertension, diabetes and secondary pulmonary hypertension.  We have been managing her on Breztri, had to go through financial assistance program but it seems that this is now being obtained.  She has DuoNeb available to use if needed, uses approximately.  At her last visit in December I added fluticasone nasal spray to her Zyrtec, continued her pantoprazole  Today she reports that she is having belching, some GERD in the daily pantoprazole. She has daily cough, non-productive. Sometimes associated w wheeze. Her breathing has been stable. No flares since last time. She is slow but active. Uses duoneb a few times a day.   ROV 10/02/2022 --72 year old woman with COPD and asthmatic features with a positive bronchodilator response.  She has mild peripheral eosinophilia and chronic rhinitis.  PMH also significant for A-fib, hypertension, diabetes, secondary PAH.  She is managed on Breztri, has DuoNeb available to use if needed - using tid.  Also has been on pantoprazole, fluticasone nasal spray, Zyrtec.  She was hospitalized in January with an acute exacerbation of her COPD.  Today she reports. She is able to exert but slowly. Has some bad days, somewhat random. Having increased cough and congestion, nasal obstruction.     Review of Systems  Constitutional:  Negative for fever and unexpected weight change.  HENT:  Negative for congestion, dental problem, ear pain, nosebleeds, postnasal drip, rhinorrhea, sinus pressure, sneezing, sore throat and trouble swallowing.   Eyes:  Negative for redness and itching.  Respiratory:  Positive for cough and shortness of breath.  Negative for chest tightness and wheezing.   Cardiovascular:  Negative for palpitations and leg swelling.  Gastrointestinal:  Negative for nausea and vomiting.  Genitourinary:  Negative for dysuria.  Musculoskeletal:  Negative for joint swelling.  Skin:  Negative for rash.  Neurological:  Negative for headaches.  Hematological:  Does not bruise/bleed easily.  Psychiatric/Behavioral:  Negative for dysphoric mood. The patient is not nervous/anxious.        Objective:   Physical Exam Vitals:   10/02/22 1121  BP: 128/70  Pulse: 67  SpO2: 98%  Weight: 260 lb (117.9 kg)  Height: 5\' 2"  (1.575 m)   Gen: Pleasant, overwt,  in no distress,  normal affect  ENT: No lesions,  mouth clear,  oropharynx clear, no postnasal drip  Neck: No JVD, no stridor  Lungs: No use of accessory muscles, clear, distant at both bases  Cardiovascular: RRR, heart sounds normal, no murmur or gallops, trace bilateral ankle peripheral edema  Musculoskeletal: No deformities, bilateral foot cyanosis, venous insufficiency  Neuro: alert, non focal  Skin: ankles cool, no lesions or rashes      Assessment & Plan:  COPD (chronic obstructive pulmonary disease) (HCC) She feels like she might be coming down with a mild bronchitis, has URI symptoms.  She is not wheezing on exam today.  I think we can avoid prolonged prednisone taper, will give her 20 mg daily for 5 days.  Also treat with doxycycline.  Continue her maintenance medications  Please take doxycycline 100 mg twice a day for 7 days until completely gone Please take prednisone 20 mg daily for 5 days and then stop Continue  Breztri 2 puffs twice a day.  Rinse and gargle after using.  We will confirm that you have refills through your mail order pharmacy for this. Continue your DuoNeb up to every 6 hours if needed for shortness of breath, chest tightness, wheezing.  We will try to find you a nebulizer mouthpiece and tubing Continue your other medications as  you have been taking them Follow with Dr Delton Coombes in 6 months or sooner if you have any problems  Levy Pupa, MD, PhD 10/02/2022, 11:45 AM [facility] [phone] or if no answer [phone]

## 2022-11-12 DIAGNOSIS — N1831 Chronic kidney disease, stage 3a: Secondary | ICD-10-CM | POA: Diagnosis not present

## 2022-11-12 DIAGNOSIS — R3129 Other microscopic hematuria: Secondary | ICD-10-CM | POA: Diagnosis not present

## 2022-11-12 DIAGNOSIS — I129 Hypertensive chronic kidney disease with stage 1 through stage 4 chronic kidney disease, or unspecified chronic kidney disease: Secondary | ICD-10-CM | POA: Diagnosis not present

## 2022-11-12 DIAGNOSIS — E1122 Type 2 diabetes mellitus with diabetic chronic kidney disease: Secondary | ICD-10-CM | POA: Diagnosis not present

## 2022-11-19 ENCOUNTER — Other Ambulatory Visit: Payer: Self-pay | Admitting: Nephrology

## 2022-11-19 DIAGNOSIS — R3129 Other microscopic hematuria: Secondary | ICD-10-CM

## 2022-12-03 ENCOUNTER — Encounter (INDEPENDENT_AMBULATORY_CARE_PROVIDER_SITE_OTHER): Payer: Self-pay

## 2022-12-11 ENCOUNTER — Ambulatory Visit
Admission: RE | Admit: 2022-12-11 | Discharge: 2022-12-11 | Disposition: A | Discharge status: Home / Self Care | Payer: Medicare Other | Source: Ambulatory Visit | Attending: Nephrology | Admitting: Nephrology

## 2022-12-11 DIAGNOSIS — R3129 Other microscopic hematuria: Secondary | ICD-10-CM | POA: Diagnosis not present

## 2022-12-17 ENCOUNTER — Other Ambulatory Visit: Payer: Self-pay

## 2022-12-17 ENCOUNTER — Emergency Department (HOSPITAL_COMMUNITY): Payer: Medicare Other

## 2022-12-17 ENCOUNTER — Encounter (HOSPITAL_COMMUNITY): Payer: Self-pay

## 2022-12-17 ENCOUNTER — Emergency Department (HOSPITAL_COMMUNITY)
Admission: EM | Admit: 2022-12-17 | Discharge: 2022-12-18 | Disposition: A | Discharge status: Home / Self Care | Payer: Medicare Other | Attending: Emergency Medicine | Admitting: Emergency Medicine

## 2022-12-17 DIAGNOSIS — Z85828 Personal history of other malignant neoplasm of skin: Secondary | ICD-10-CM | POA: Diagnosis not present

## 2022-12-17 DIAGNOSIS — R600 Localized edema: Secondary | ICD-10-CM | POA: Diagnosis not present

## 2022-12-17 DIAGNOSIS — J449 Chronic obstructive pulmonary disease, unspecified: Secondary | ICD-10-CM | POA: Diagnosis not present

## 2022-12-17 DIAGNOSIS — D649 Anemia, unspecified: Secondary | ICD-10-CM

## 2022-12-17 DIAGNOSIS — Z79899 Other long term (current) drug therapy: Secondary | ICD-10-CM | POA: Diagnosis not present

## 2022-12-17 DIAGNOSIS — R2243 Localized swelling, mass and lump, lower limb, bilateral: Secondary | ICD-10-CM | POA: Insufficient documentation

## 2022-12-17 DIAGNOSIS — Z7901 Long term (current) use of anticoagulants: Secondary | ICD-10-CM | POA: Insufficient documentation

## 2022-12-17 DIAGNOSIS — R0602 Shortness of breath: Secondary | ICD-10-CM | POA: Diagnosis not present

## 2022-12-17 DIAGNOSIS — J45909 Unspecified asthma, uncomplicated: Secondary | ICD-10-CM | POA: Insufficient documentation

## 2022-12-17 DIAGNOSIS — Z7984 Long term (current) use of oral hypoglycemic drugs: Secondary | ICD-10-CM | POA: Insufficient documentation

## 2022-12-17 DIAGNOSIS — E119 Type 2 diabetes mellitus without complications: Secondary | ICD-10-CM | POA: Insufficient documentation

## 2022-12-17 LAB — BASIC METABOLIC PANEL
Anion gap: 11 (ref 5–15)
BUN: 20 mg/dL (ref 8–23)
CO2: 26 mmol/L (ref 22–32)
Calcium: 9 mg/dL (ref 8.9–10.3)
Chloride: 101 mmol/L (ref 98–111)
Creatinine, Ser: 0.99 mg/dL (ref 0.44–1.00)
GFR, Estimated: >60 mL/min (ref >60)
Glucose, Bld: 146 mg/dL — ABNORMAL HIGH (ref 70–99)
Potassium: 4.1 mmol/L (ref 3.5–5.1)
Sodium: 138 mmol/L (ref 135–145)

## 2022-12-17 LAB — CBC
HCT: 27.8 % — ABNORMAL LOW (ref 36.0–46.0)
Hemoglobin: 8.2 g/dL — ABNORMAL LOW (ref 12.0–15.0)
MCH: 22.8 pg — ABNORMAL LOW (ref 26.0–34.0)
MCHC: 29.5 g/dL — ABNORMAL LOW (ref 30.0–36.0)
MCV: 77.4 fL — ABNORMAL LOW (ref 80.0–100.0)
Platelets: 374 10*3/uL (ref 150–400)
RBC: 3.59 MIL/uL — ABNORMAL LOW (ref 3.87–5.11)
RDW: 16.4 % — ABNORMAL HIGH (ref 11.5–15.5)
WBC: 7.9 10*3/uL (ref 4.0–10.5)
nRBC: 0 % (ref 0.0–0.2)

## 2022-12-17 LAB — TROPONIN I (HIGH SENSITIVITY)
Troponin I (High Sensitivity): 4 ng/L (ref <18)
Troponin I (High Sensitivity): 3 ng/L (ref <18)

## 2022-12-17 LAB — BRAIN NATRIURETIC PEPTIDE: B Natriuretic Peptide: 204.2 pg/mL — ABNORMAL HIGH (ref 0.0–100.0)

## 2022-12-17 NOTE — ED Triage Notes (Signed)
Reports sob and can't catch her breath and excessive swelling in bilateral legs.  Hx of afib.

## 2022-12-17 NOTE — ED Provider Triage Note (Signed)
Emergency Medicine Provider Triage Evaluation Note  Andrea Perry , a 72 y.o. female  was evaluated in triage.  Pt complains of shortness of breath with bilateral leg swelling for the past 3 to 4 days.  Patient denies any history of any congestive heart failure.  She reports that she has been doubling up on her Lasix, 80 mg a day for the past 3 days to see if this helps.  She reports that she has been urinating more but does not feel like it has been helping her leg swelling.  She denies any history of any congestive heart failure however it is listed in her medical history.  Additionally, she does have history of COPD.  Review of Systems  Positive:  Negative:   Physical Exam  BP (!) 107/51 (BP Location: Right Wrist)   Pulse 85   Temp 98.4 F (36.9 C)   Resp (!) 22   Ht 5\' 2"  (1.575 m)   Wt 122.5 kg   SpO2 99%   BMI 49.38 kg/m  Gen:   Awake, no distress   Resp:  Mild tachypnea.  Needs to take a break almost at the end of the sentence.  Lung sounds are diminished at the bases. MSK:   Moves extremities without difficulty  Other:  2+ pitting edema bilateral lower legs.  No overlying cellulitis or warmth noted.  Medical Decision Making  Medically screening exam initiated at 5:34 PM.  Appropriate orders placed.  Andrea Perry was informed that the remainder of the evaluation will be completed by another provider, this initial triage assessment does not replace that evaluation, and the importance of remaining in the ED until their evaluation is complete.  Chest pain labs ordered.  Patient satting well on room air.  Only becomes tachypneic with speech.   Achille Rich, [place] 12/17/22 1735

## 2022-12-18 ENCOUNTER — Other Ambulatory Visit: Payer: Self-pay

## 2022-12-18 LAB — HEMOGLOBIN AND HEMATOCRIT, BLOOD
HCT: 30.5 % — ABNORMAL LOW (ref 36.0–46.0)
Hemoglobin: 8.9 g/dL — ABNORMAL LOW (ref 12.0–15.0)

## 2022-12-18 LAB — POC OCCULT BLOOD, ED: Fecal Occult Bld: NEGATIVE

## 2022-12-18 MED ORDER — FERROUS SULFATE 325 (65 FE) MG PO TABS: 325.0000 mg | ORAL_TABLET | Freq: Every day | ORAL | 0 refills | Status: AC

## 2022-12-18 MED ORDER — FUROSEMIDE 10 MG/ML IJ SOLN
40.0000 mg | Freq: Once | INTRAMUSCULAR | Status: AC
  Administered 2022-12-18: 40 mg via INTRAVENOUS
  Filled 2022-12-18: qty 4

## 2022-12-18 MED ORDER — ACETAMINOPHEN 500 MG PO TABS
1000.0000 mg | ORAL_TABLET | Freq: Once | ORAL | Status: AC
  Administered 2022-12-18: 1000 mg via ORAL
  Filled 2022-12-18: qty 2

## 2022-12-18 NOTE — ED Provider Notes (Signed)
[name] Provider Note   CSN: [id number] Arrival date & time: 12/17/22  1628     History  Chief Complaint  Patient presents with   Shortness of Breath   Leg Swelling    Andrea Perry is a 72 y.o. female.  HPI     This is a 72 year old female who presents with shortness of breath.  Patient reports worsening shortness of breath and bilateral leg swelling.  Does have a history of COPD but no history of heart failure.  She reports she has doubled her Lasix for the last several days without help.  She reports some cough but no fevers.   Denies chest pain.  Hemoglobin noted to be acutely low.  Denies any black or tarry stools.  Denies blood in stool.  No obvious bleeding sources.  Home Medications Prior to Admission medications   Medication Sig Start Date End Date Taking? Authorizing Provider  ferrous sulfate 325 (65 FE) MG tablet Take 1 tablet (325 mg total) by mouth daily. 12/18/22  Yes Jamale Spangler, Mayer Masker, MD  acetaminophen (TYLENOL) 500 MG tablet Take 500 mg by mouth 2 (two) times daily.    [provider]  albuterol (PROVENTIL) (2.5 MG/3ML) 0.083% nebulizer solution USE EVERY 4 HOURS WITH IPRATROPIUM 04/21/22   Leslye Peer, MD  amLODipine (NORVASC) 10 MG tablet Take 1 tablet (10 mg total) by mouth daily. 02/04/22   Corwin Levins, MD  apixaban Everlene Balls) 5 MG TABS tablet Take 1 tablet by mouth twice daily 07/24/22   Wendall Stade, MD  Blood Glucose Monitoring Suppl (FREESTYLE LITE) DEVI Use as directed daily E11.9 09/26/18   Corwin Levins, MD  Budeson-Glycopyrrol-Formoterol (BREZTRI AEROSPHERE) 160-9-4.8 MCG/ACT AERO Inhale 2 puffs into the lungs 2 (two) times daily. 08/26/21   Leslye Peer, MD  cetirizine (ZYRTEC) 10 MG tablet Take 10 mg by mouth daily.    [provider]  Cholecalciferol 50 MCG (2000 UT) TABS 1 tab by mouth once daily 02/05/22   Corwin Levins, MD  cyclobenzaprine (FLEXERIL) 5 MG tablet     [provider]  dimenhyDRINATE (DRAMAMINE) 50 MG tablet Take by mouth every 8 (eight) hours as needed. 05/26/16   [provider]  doxycycline (VIBRA-TABS) 100 MG tablet Take 1 tablet (100 mg total) by mouth 2 (two) times daily. 10/02/22   Leslye Peer, MD  escitalopram (LEXAPRO) 20 MG tablet Take 1 tablet (20 mg total) by mouth daily. Patient taking differently: Take 20 mg by mouth at bedtime. 02/04/22   Corwin Levins, MD  estradiol (ESTRACE) 1 MG tablet Take 1 mg by mouth daily. 12/18/18   [provider]  fluorouracil (EFUDEX) 5 % cream Apply topically. 11/06/15   [provider]  furosemide (LASIX) 40 MG tablet Take 1 tablet (40 mg total) by mouth daily. 02/04/22   Corwin Levins, MD  glucose blood (FREESTYLE LITE) test strip Use as instructed once daily E11.9 09/26/18   Corwin Levins, MD  GUAIFENESIN ER PO Take 600 mg by mouth daily as needed for cough.    [provider]  ipratropium (ATROVENT) 0.02 % nebulizer solution INHALE 1 VIAL IN NEBULIZER 4 TIMES DAILY WITH  ALBUTEROL 08/07/21   Leslye Peer, MD  ipratropium-albuterol (DUONEB) 0.5-2.5 (3) MG/3ML SOLN USE 1 AMPULE IN NEBULIZER 4 TIMES DAILY 12/23/21   Leslye Peer, MD  irbesartan (AVAPRO) 150 MG tablet Take 1 tablet (150 mg total)  by mouth daily. 02/04/22   Corwin Levins, MD  ketoconazole (NIZORAL) 2 % cream     [provider]  Lancets MISC Use as directed once daily E11.9 09/26/18   Corwin Levins, MD  levothyroxine (SYNTHROID) 50 MCG tablet Take 1 tablet (50 mcg total) by mouth daily. 02/05/22   Corwin Levins, MD  LORazepam (ATIVAN) 1 MG tablet Take 1 tablet by mouth twice daily as needed for anxiety 09/07/22   Corwin Levins, MD  Magnesium Oxide -Mg Supplement 500 MG CAPS 1 tab by mouth twice per day Patient taking differently: Take 1 capsule by mouth in the morning. 1 tab by mouth twice per day 02/05/22   Corwin Levins, MD  metFORMIN (GLUCOPHAGE-XR) 500 MG 24 hr tablet TAKE 2 TABLETS BY MOUTH  ONCE DAILY WITH BREAKFAST 02/04/22   Corwin Levins, MD  metoprolol tartrate (LOPRESSOR) 100 MG tablet Take 1 tablet (100 mg total) by mouth 2 (two) times daily. 08/05/22   Wendall Stade, MD  pantoprazole (PROTONIX) 40 MG tablet Take 1 tablet by mouth once daily 09/07/22   Corwin Levins, MD  potassium chloride SA (KLOR-CON) 20 MEQ tablet Take 1 tablet (20 mEq total) by mouth daily. 06/14/20   Joycelyn Das, MD  promethazine-dextromethorphan (PROMETHAZINE-DM) 6.25-15 MG/5ML syrup     [provider]  rosuvastatin (CRESTOR) 40 MG tablet Take 1 tablet by mouth once daily 09/02/22   Corwin Levins, MD  TETRAHYDROZOLINE HCL OP Apply to eye. 05/26/16   [provider]  tiZANidine (ZANAFLEX) 4 MG tablet Take 1 tablet (4 mg total) by mouth every 6 (six) hours as needed for muscle spasms. 07/18/21   Corwin Levins, MD  tolterodine (DETROL LA) 4 MG 24 hr capsule Take 1 capsule (4 mg total) by mouth daily. 02/04/22   Corwin Levins, MD  vitamin E 180 MG (400 UNITS) capsule Take by mouth. 05/26/16   [provider]      Allergies    Tramadol hcl, Other, Codeine, Tape, and Vicodin [hydrocodone-acetaminophen]    Review of Systems   Review of Systems  Constitutional:  Negative for fever.  Respiratory:  Positive for shortness of breath.   Cardiovascular:  Positive for leg swelling. Negative for chest pain.  Gastrointestinal:  Negative for abdominal pain, nausea and vomiting.  All other systems reviewed and are negative.   Physical Exam Updated Vital Signs BP (!) 105/52   Pulse 80   Temp 97.9 F (36.6 C) (Oral)   Resp 14   Ht 1.575 m (5\' 2" )   Wt 122.5 kg   SpO2 98%   BMI 49.38 kg/m  Physical Exam Vitals and nursing note reviewed.  Constitutional:      Appearance: She is well-developed. She is obese. She is not ill-appearing.  HENT:     Head: Normocephalic and atraumatic.  Eyes:     Pupils: Pupils are equal, round, and reactive to light.  Cardiovascular:     Rate and  Rhythm: Normal rate and regular rhythm.     Heart sounds: Normal heart sounds.  Pulmonary:     Effort: Pulmonary effort is normal. No respiratory distress.     Breath sounds: No wheezing.  Abdominal:     General: Bowel sounds are normal.     Palpations: Abdomen is soft.     Tenderness: There is no guarding.     Comments: No obvious blood in stool  Musculoskeletal:     Cervical back: Neck  supple.     Right lower leg: Edema present.     Left lower leg: Edema present.  Skin:    General: Skin is warm and dry.  Neurological:     Mental Status: She is alert and oriented to person, place, and time.  Psychiatric:        Mood and Affect: Mood normal.     ED Results / Procedures / Treatments   Labs (all labs ordered are listed, but only abnormal results are displayed) Labs Reviewed  BASIC METABOLIC PANEL - Abnormal; Notable for the following components:      Result Value   Glucose, Bld 146 (*)    All other components within normal limits  CBC - Abnormal; Notable for the following components:   RBC 3.59 (*)    Hemoglobin 8.2 (*)    HCT 27.8 (*)    MCV 77.4 (*)    MCH 22.8 (*)    MCHC 29.5 (*)    RDW 16.4 (*)    All other components within normal limits  BRAIN NATRIURETIC PEPTIDE - Abnormal; Notable for the following components:   B Natriuretic Peptide 204.2 (*)    All other components within normal limits  HEMOGLOBIN AND HEMATOCRIT, BLOOD - Abnormal; Notable for the following components:   Hemoglobin 8.9 (*)    HCT 30.5 (*)    All other components within normal limits  POC OCCULT BLOOD, ED  TROPONIN I (HIGH SENSITIVITY)  TROPONIN I (HIGH SENSITIVITY)    EKG None  Radiology DG Chest 2 View  Result Date: 12/17/2022 CLINICAL DATA:  Shortness of breath EXAM: CHEST - 2 VIEW COMPARISON:  04/20/2022 and older FINDINGS: No consolidation, pneumothorax or effusion. No edema. Normal cardiopericardial silhouette. Degenerative changes seen along the spine. Film is slightly rotated  to the left. Film is under penetrated. IMPRESSION: No acute cardiopulmonary disease. Electronically Signed   By: Karen Kays M.D.   On: 12/17/2022 17:18    Procedures Procedures    Medications Ordered in ED Medications  furosemide (LASIX) injection 40 mg (40 mg Intravenous Given 12/18/22 0153)  acetaminophen (TYLENOL) tablet 1,000 mg (1,000 mg Oral Given 12/18/22 0358)    ED Course/ Medical Decision Making/ A&P Clinical Course as of 12/18/22 0448  Fri Dec 18, 2022  0047 Hemoglobin(!): 8.2 [JB]    Clinical Course User Index [JB] Barrett, Horald Chestnut, PA-C                                 Medical Decision Making Amount and/or Complexity of Data Reviewed Labs: ordered. Radiology: ordered.  Risk OTC drugs. Prescription drug management.   This patient presents to the ED for concern of shortness of breath, this involves an extensive number of treatment options, and is a complaint that carries with it a high risk of complications and morbidity.  I considered the following differential and admission for this acute, potentially life threatening condition.  The differential diagnosis includes pneumonia, pneumothorax, pulmonary edema, atypical ACS  MDM:    This is a 72 year old female who presents with concern for shortness of breath.  Also noted to have some leg swelling.  She has had increased Lasix with little relief.  Denies history of heart failure but has a documented EF of low normal 50 to 55%.  No signs or symptoms of infection.  She is hemodynamically stable.  No fever.  Exam is fairly unremarkable and she is in no respiratory  distress.  She does appear slightly volume overloaded.  Chest x-ray with mild pulmonary edema.  Patient was given 1 dose of IV Lasix.  EKG is nonischemic.  Troponin x 2 negative.  Only notable lab is hemoglobin of 8.2 which is down from baseline around 11.  She denies any active bleeding.  Repeat confirms at 8.9.  Hemoccult negative with brown stool in the rectal  vault.  She ambulated and maintain her pulse ox without any respiratory distress.  Recommend she follow-up with her primary physician for repeat of hemoglobin.  Continue increased dose of Lasix for the next several days.  (Labs, imaging, consults)  Labs: I Ordered, and personally interpreted labs.  The pertinent results include: CBC, BMP, troponin x 2  Imaging Studies ordered: I ordered imaging studies including chest x-ray I independently visualized and interpreted imaging. I agree with the radiologist interpretation  Additional history obtained from chart review.  External records from outside source obtained and reviewed including prior evaluations, echocardiogram  Cardiac Monitoring: The patient was maintained on a cardiac monitor.  If on the cardiac monitor, I personally viewed and interpreted the cardiac monitored which showed an underlying rhythm of: Atrial fibrillation  Reevaluation: After the interventions noted above, I reevaluated the patient and found that they have :stayed the same  Social Determinants of Health:  lives independently  Disposition: Discharge  Co morbidities that complicate the patient evaluation  Past Medical History:  Diagnosis Date   ALLERGIC RHINITIS 08/16/2008   ANXIETY 08/16/2008   Arthritis    hands, knees, lower back   ASTHMA 08/16/2008   Asthma    BRONCHITIS     DR. 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   GERD (gastroesophageal reflux disease)    H/O hiatal hernia    HEMATOCHEZIA 06/20/2009   Hemorrhoids    History of cardioversion 10/30/14   History of kidney stones    HYPERLIPIDEMIA 08/16/2008   HYPERTENSION 12/03/2006   Hypertension    Impaired glucose tolerance 09/21/2010   Overactive  bladder 10/20/2015   Pneumonia    as a child   Pulmonary HTN (HCC) 06/19/2015   Shortness of breath dyspnea    Sleep apnea    MILD NO CPAP ORDERED   Snoring 06/19/2015     Medicines Meds ordered this encounter  Medications   furosemide (LASIX) injection 40 mg   acetaminophen (TYLENOL) tablet 1,000 mg   ferrous sulfate 325 (65 FE) MG tablet    Sig: Take 1 tablet (325 mg total) by mouth daily.    Dispense:  30 tablet    Refill:  0    I have reviewed the patients home medicines and have made adjustments as needed  Problem List / ED Course: Problem List Items Addressed This Visit       Other   Shortness of breath - Primary   Other Visit Diagnoses     Bilateral lower extremity edema       Anemia, unspecified type       Relevant Medications   ferrous sulfate 325 (65 FE) MG tablet  Final Clinical Impression(s) / ED Diagnoses Final diagnoses:  Shortness of breath  Bilateral lower extremity edema  Anemia, unspecified type    Rx / DC Orders ED Discharge Orders          Ordered    ferrous sulfate 325 (65 FE) MG tablet  Daily        12/18/22 0439              Shon Baton, MD 12/18/22 [phone]

## 2022-12-18 NOTE — Discharge Instructions (Addendum)
You were seen today for shortness of breath.  It does appear that you have some volume overload.  Continue your increased dose of Lasix.  Follow-up closely with your primary doctor for recheck.  You also were found to have a low hemoglobin.  Follow-up with your primary doctor for recheck and iron studies.  Start iron tablets.

## 2022-12-18 NOTE — ED Notes (Signed)
Ambulated pt with walker( pt stated she ambulates with a walker at home). Pt maintained  95-97% RA and heart rate at 100. Pt did display sob towards the end of our walk, but maitained a stat at 96% during that time. No complaint of dizziness.

## 2023-01-06 ENCOUNTER — Telehealth: Payer: Self-pay | Admitting: *Deleted

## 2023-01-06 NOTE — Telephone Encounter (Signed)
Transition Care Management Unsuccessful Follow-up Telephone Call  Date of discharge and from where:  [facility]. [facility]  12/18/2022  Attempts:  1st Attempt  Reason for unsuccessful TCM follow-up call:  No answer/busy

## 2023-01-07 ENCOUNTER — Telehealth: Payer: Self-pay | Admitting: Emergency Medicine

## 2023-01-07 NOTE — Telephone Encounter (Signed)
Pt calling in to get a refill for free Budeson-Glycopyrrol-Formoterol (BREZTRI AEROSPHERE) 160-9-4.8 MCG/ACT [id number] - [place] ([place]), [place] - [address]

## 2023-01-08 MED ORDER — BREZTRI AEROSPHERE 160-9-4.8 MCG/ACT IN AERO: 2.0000 | INHALATION_SPRAY | Freq: Two times a day (BID) | RESPIRATORY_TRACT | 11 refills | Status: DC

## 2023-01-08 NOTE — Telephone Encounter (Signed)
Rx for pt's breztri inhaler has been sent to pharmacy for pt.

## 2023-01-15 ENCOUNTER — Ambulatory Visit: Payer: Medicare Other | Admitting: Sports Medicine

## 2023-01-18 ENCOUNTER — Other Ambulatory Visit: Payer: Self-pay | Admitting: Emergency Medicine

## 2023-01-18 ENCOUNTER — Ambulatory Visit (INDEPENDENT_AMBULATORY_CARE_PROVIDER_SITE_OTHER): Payer: Medicare Other

## 2023-01-18 VITALS — Ht 62.0 in | Wt 270.0 lb

## 2023-01-18 DIAGNOSIS — R053 Chronic cough: Secondary | ICD-10-CM

## 2023-01-18 DIAGNOSIS — Z0000 Encounter for general adult medical examination without abnormal findings: Secondary | ICD-10-CM | POA: Diagnosis not present

## 2023-01-18 NOTE — Patient Instructions (Addendum)
Andrea Perry , Thank you for taking time to come for your Medicare Wellness Visit. I appreciate your ongoing commitment to your health goals. Please review the following plan we discussed and let me know if I can assist you in the future.   Referrals/Orders/Follow-Ups/Clinician Recommendations: No  This is a list of the screening recommended for you and due dates:  Health Maintenance  Topic Date Due   Zoster (Shingles) Vaccine (1 of 2) Never done   Mammogram  03/04/2018   DTaP/Tdap/Td vaccine (2 - Td or Tdap) 09/23/2020   Eye exam for diabetics  09/23/2022   Flu Shot  11/19/2022   Colon Cancer Screening  02/05/2023*   Hemoglobin A1C  01/23/2023   Yearly kidney health urinalysis for diabetes  07/24/2023   Complete foot exam   07/24/2023   Yearly kidney function blood test for diabetes  12/17/2023   Medicare Annual Wellness Visit  01/18/2024   Pneumonia Vaccine  Completed   DEXA scan (bone density measurement)  Completed   Hepatitis C Screening  Completed   HPV Vaccine  Aged Out   COVID-19 Vaccine  Discontinued  *Topic was postponed. The date shown is not the original due date.    Advanced directives: (Declined) Advance directive discussed with you today. Even though you declined this today, please call our office should you change your mind, and we can give you the proper paperwork for you to fill out.  Next Medicare Annual Wellness Visit scheduled for next year: Yes  Preventive Care attachment FALL PREVENTION attachment

## 2023-01-18 NOTE — Progress Notes (Signed)
Subjective:   Andrea Perry is a 72 y.o. female who presents for Medicare Annual (Subsequent) preventive examination.  Visit Complete: Virtual  I connected with  Andrea Perry on 01/18/23 by a audio enabled telemedicine application and verified that I am speaking with the correct person using two identifiers.  Patient Location: Home  Provider Location: Office/Clinic  I discussed the limitations of evaluation and management by telemedicine. The patient expressed understanding and agreed to proceed.  Because this visit was a virtual/telehealth visit, some criteria may be missing or patient reported. Any vitals not documented were not able to be obtained and vitals that have been documented are patient reported.   Cardiac Risk Factors include: advanced age (>24men, >63 women);diabetes mellitus;dyslipidemia;family history of premature cardiovascular disease;hypertension;obesity (BMI >30kg/m2)     Objective:    Today's Vitals   01/18/23 1303  Weight: 270 lb (122.5 kg)  Height: 5\' 2"  (1.575 m)  PainSc: 0-No pain   Body mass index is 49.38 kg/m.     01/18/2023    1:05 PM 12/17/2022    4:36 PM 04/21/2022    8:23 PM 04/21/2022    7:02 PM 01/16/2022    3:09 PM 06/13/2020   10:36 PM 05/21/2020    4:40 PM  Advanced Directives  Does Patient Have a Medical Advance Directive? No No  No No No No  Would patient like information on creating a medical advance directive? No - Patient declined No - Patient declined No - Patient declined  No - Patient declined No - Patient declined No - Patient declined    Current Medications (verified) Outpatient Encounter Medications as of 01/18/2023  Medication Sig   acetaminophen (TYLENOL) 500 MG tablet Take 500 mg by mouth 2 (two) times daily.   albuterol (PROVENTIL) (2.5 MG/3ML) 0.083% nebulizer solution USE EVERY 4 HOURS WITH IPRATROPIUM   amLODipine (NORVASC) 10 MG tablet Take 1 tablet (10 mg total) by mouth daily.   apixaban (ELIQUIS) 5 MG TABS tablet  Take 1 tablet by mouth twice daily   Blood Glucose Monitoring Suppl (FREESTYLE LITE) DEVI Use as directed daily E11.9   Budeson-Glycopyrrol-Formoterol (BREZTRI AEROSPHERE) 160-9-4.8 MCG/ACT AERO Inhale 2 puffs into the lungs 2 (two) times daily.   cetirizine (ZYRTEC) 10 MG tablet Take 10 mg by mouth daily.   Cholecalciferol 50 MCG (2000 UT) TABS 1 tab by mouth once daily   cyclobenzaprine (FLEXERIL) 5 MG tablet    dimenhyDRINATE (DRAMAMINE) 50 MG tablet Take by mouth every 8 (eight) hours as needed.   doxycycline (VIBRA-TABS) 100 MG tablet Take 1 tablet (100 mg total) by mouth 2 (two) times daily.   escitalopram (LEXAPRO) 20 MG tablet Take 1 tablet (20 mg total) by mouth daily. (Patient taking differently: Take 20 mg by mouth at bedtime.)   estradiol (ESTRACE) 1 MG tablet Take 1 mg by mouth daily.   ferrous sulfate 325 (65 FE) MG tablet Take 1 tablet (325 mg total) by mouth daily.   fluorouracil (EFUDEX) 5 % cream Apply topically.   furosemide (LASIX) 40 MG tablet Take 1 tablet (40 mg total) by mouth daily.   glucose blood (FREESTYLE LITE) test strip Use as instructed once daily E11.9   GUAIFENESIN ER PO Take 600 mg by mouth daily as needed for cough.   ipratropium (ATROVENT) 0.02 % nebulizer solution INHALE 1 VIAL IN NEBULIZER 4 TIMES DAILY WITH  ALBUTEROL   ipratropium-albuterol (DUONEB) 0.5-2.5 (3) MG/3ML SOLN USE 1 AMPULE IN NEBULIZER 4 TIMES DAILY   irbesartan (  AVAPRO) 150 MG tablet Take 1 tablet (150 mg total) by mouth daily.   ketoconazole (NIZORAL) 2 % cream    Lancets MISC Use as directed once daily E11.9   levothyroxine (SYNTHROID) 50 MCG tablet Take 1 tablet (50 mcg total) by mouth daily.   LORazepam (ATIVAN) 1 MG tablet Take 1 tablet by mouth twice daily as needed for anxiety   Magnesium Oxide -Mg Supplement 500 MG CAPS 1 tab by mouth twice per day (Patient taking differently: Take 1 capsule by mouth in the morning. 1 tab by mouth twice per day)   metFORMIN (GLUCOPHAGE-XR) 500 MG  24 hr tablet TAKE 2 TABLETS BY MOUTH ONCE DAILY WITH BREAKFAST   metoprolol tartrate (LOPRESSOR) 100 MG tablet Take 1 tablet (100 mg total) by mouth 2 (two) times daily.   pantoprazole (PROTONIX) 40 MG tablet Take 1 tablet by mouth once daily   potassium chloride SA (KLOR-CON) 20 MEQ tablet Take 1 tablet (20 mEq total) by mouth daily.   promethazine-dextromethorphan (PROMETHAZINE-DM) 6.25-15 MG/5ML syrup    rosuvastatin (CRESTOR) 40 MG tablet Take 1 tablet by mouth once daily   TETRAHYDROZOLINE HCL OP Apply to eye.   tiZANidine (ZANAFLEX) 4 MG tablet Take 1 tablet (4 mg total) by mouth every 6 (six) hours as needed for muscle spasms.   tolterodine (DETROL LA) 4 MG 24 hr capsule Take 1 capsule (4 mg total) by mouth daily.   vitamin E 180 MG (400 UNITS) capsule Take by mouth.   No facility-administered encounter medications on file as of 01/18/2023.    Allergies (verified) Tramadol hcl, Other, Codeine, Tape, and Vicodin [hydrocodone-acetaminophen]   History: Past Medical History:  Diagnosis Date   ALLERGIC RHINITIS 08/16/2008   ANXIETY 08/16/2008   Arthritis    hands, knees, lower back   ASTHMA 08/16/2008   Asthma    BRONCHITIS     DR. 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   GERD (gastroesophageal reflux disease)    H/O hiatal hernia    HEMATOCHEZIA 06/20/2009   Hemorrhoids    History of cardioversion 10/30/14   History of kidney stones    HYPERLIPIDEMIA 08/16/2008   HYPERTENSION 12/03/2006   Hypertension    Impaired glucose tolerance 09/21/2010   Overactive bladder 10/20/2015   Pneumonia    as a child   Pulmonary HTN (HCC) 06/19/2015   Shortness of breath dyspnea    Sleep apnea    MILD NO CPAP ORDERED   Snoring 06/19/2015   Past  Surgical History:  Procedure Laterality Date   ABDOMINAL HYSTERECTOMY     APPENDECTOMY     CARDIOVERSION N/A 03/20/2014   Procedure: CARDIOVERSION;  Surgeon: Wendall Stade, MD;  Location: [facility] ENDOSCOPY;  Service: Cardiovascular;  Laterality: N/A;   CARDIOVERSION N/A 10/30/2014   Procedure: CARDIOVERSION;  Surgeon: Wendall Stade, MD;  Location[facility] ENDOSCOPY;  Service: Cardiovascular;  Laterality: N/A;   CARDIOVERSION N/A 04/03/2019   Procedure: CARDIOVERSION;  Surgeon: Lars Masson, MD;  Location: [facility] ENDOSCOPY;  Service: Cardiovascular;  Laterality: N/A;   CARPAL TUNNEL RELEASE     Right + LEFT   CESAREAN SECTION  x 1    COLONOSCOPY  2004   CYST EXCISION     RT ARM    CYSTOCELE REPAIR N/A 10/25/2012   Procedure: ANTERIOR REPAIR (CYSTOCELE);  Surgeon: Miguel Aschoff, MD;  Location: [facility] ORS;  Service: Gynecology;  Laterality: N/A;   HEEL SPUR SURGERY Bilateral    KNEE SURGERY     Right + LEFT   LUMBAR LAMINECTOMY/DECOMPRESSION MICRODISCECTOMY N/A 03/19/2016   Procedure: Laminectomy and Foraminotomy - Lumbar three-four - Lumbar four-five;  Surgeon: Tia Alert, MD;  Location: [facility] OR;  Service: Neurosurgery;  Laterality: N/A;   RADIOLOGY WITH ANESTHESIA Right 11/01/2014   Procedure: MRI RIGHT FOREARM;  Surgeon: Medication Radiologist, MD;  Location: [facility] OR;  Service: Radiology;  Laterality: Right;   RADIOLOGY WITH ANESTHESIA Right 08/29/2015   Procedure: MRI - RIGHT FOREARM;  Surgeon: Medication Radiologist, MD;  Location: [facility] OR;  Service: Radiology;  Laterality: Right;   RADIOLOGY WITH ANESTHESIA N/A 12/05/2015   Procedure: MRI SPINE WITHOUT;  Surgeon: Medication Radiologist, MD;  Location: [facility] OR;  Service: Radiology;  Laterality: N/A;   SKIN CANCER EXCISION     BILAT SHOULDERS   SPLIT NIGHT STUDY  09/04/2015   TONSILLECTOMY AND ADENOIDECTOMY     Family History  Problem Relation Age of Onset   Diabetes Mother    Hyperlipidemia Mother    Hypertension Mother    Colon polyps Mother     Stroke Father    Colon cancer Neg Hx    Social History   Socioeconomic History   Marital status: Widowed    Spouse name: Not on file   Number of children: 1   Years of education: Not on file   Highest education level: Not on file  Occupational History   Occupation: currently unemployed, former [employer] (medical)  Tobacco Use   Smoking status: Former    Current packs/day: 0.00    Average packs/day: 1.5 packs/day for 30.0 years (45.0 ttl pk-yrs)    Types: Cigarettes    Start date: 04/20/1973    Quit date: 04/21/2003    Years since quitting: 19.7   Smokeless tobacco: Never  Vaping Use   Vaping status: Never Used  Substance and Sexual Activity   Alcohol use: Yes    Alcohol/week: 2.0 standard drinks of alcohol    Types: 2 Standard drinks or equivalent per week    Comment: OCCAS   Drug use: No   Sexual activity: Not Currently    Birth control/protection: None  Other Topics Concern   Not on file  Social History Narrative   3 caffeine drinks daily    Social Determinants of Health   Financial Resource Strain: Low Risk  (01/18/2023)   Overall Financial Resource Strain (CARDIA)    Difficulty of Paying Living Expenses: Not hard at all  Food Insecurity: No Food Insecurity (01/18/2023)   Hunger Vital Sign    Worried About Running Out of Food in the Last Year: Never true    Ran Out of Food in the Last Year: Never true  Transportation Needs: No Transportation Needs (01/18/2023)   PRAPARE - Administrator, Civil Service (Medical): No    Lack of Transportation (Non-Medical): No  Physical Activity: Sufficiently Active (01/18/2023)   Exercise Vital Sign    Days of Exercise per Week: 5 days    Minutes of Exercise per Session: 30 min  Stress: No Stress Concern Present (01/18/2023)   [employer] of Occupational Health - Occupational Stress Questionnaire  Feeling of Stress : Not at all  Social Connections: Socially Isolated (01/18/2023)   Social Connection and  Isolation Panel [NHANES]    Frequency of Communication with Friends and Family: More than three times a week    Frequency of Social Gatherings with Friends and Family: Once a week    Attends Religious Services: Never    Database administrator or Organizations: No    Attends Banker Meetings: Never    Marital Status: Widowed    Tobacco Counseling Counseling given: Not Answered   Clinical Intake:  Pre-visit preparation completed: Yes  Pain : No/denies pain Pain Score: 0-No pain     BMI - recorded: 49.38 Nutritional Status: BMI > 30  Obese Nutritional Risks: None Diabetes: Yes CBG done?: No Did pt. bring in CBG monitor from home?: No  How often do you need to have someone help you when you read instructions, pamphlets, or other written materials from your doctor or pharmacy?: 1 - Never What is the last grade level you completed in school?: HSG  Interpreter Needed?: No  Information entered by :: Hayze Gazda N. Aidin Doane, LPN.   Activities of Daily Living    01/18/2023    1:07 PM 04/21/2022    8:19 PM  In your present state of health, do you have any difficulty performing the following activities:  Hearing? 0 0  Vision? 0 0  Difficulty concentrating or making decisions? 0 0  Walking or climbing stairs? 0 0  Dressing or bathing? 0 0  Doing errands, shopping? 0 0  Preparing Food and eating ? N   Using the Toilet? N   In the past six months, have you accidently leaked urine? Y   Comment OAB   Do you have problems with loss of bowel control? N   Managing your Medications? N   Managing your Finances? N   Housekeeping or managing your Housekeeping? N     Patient Care Team: Corwin Levins, MD as PCP - General Eden Emms Noralyn Pick, MD as PCP - Cardiology (Cardiology) Leslye Peer, MD as PCP - Pulmonology (Pulmonary Disease)  Indicate any recent Medical Services you may have received from other than [facility] providers in the past year (date may be approximate).      Assessment:   This is a routine wellness examination for [place].  Hearing/Vision screen Hearing Screening - Comments:: Patient denied any hearing difficulty.   No hearing aids.   Vision Screening - Comments:: Patient does wear corrective lenses/contacts.  Annual eye exam done by: Maris Berger, MD.     Goals Addressed             This Visit's Progress    Client understands the importance of follow-up with providers by attending scheduled visits        Depression Screen    01/18/2023    1:06 PM 08/06/2022    3:32 PM 07/24/2022    2:09 PM 05/13/2022   10:49 AM 02/04/2022    2:38 PM 01/16/2022    3:07 PM 07/18/2021    1:53 PM  PHQ 2/9 Scores  PHQ - 2 Score 0 0 0 0 0 0 1  PHQ- 9 Score 0   0 0 0     Fall Risk    01/18/2023    1:06 PM 08/06/2022    3:32 PM 07/24/2022    2:09 PM 05/13/2022   10:49 AM 02/04/2022    2:38 PM  Fall Risk   Falls in the  past year? 0 0 0 0 0  Number falls in past yr: 0 0 0 0   Injury with Fall? 0 0 0 0   Risk for fall due to : No Fall Risks No Fall Risks  No Fall Risks No Fall Risks  Follow up Falls prevention discussed Falls evaluation completed  Falls evaluation completed Falls evaluation completed    MEDICARE RISK AT HOME: Medicare Risk at Home Any stairs in or around the home?: No If so, are there any without handrails?: No Home free of loose throw rugs in walkways, pet beds, electrical cords, etc?: Yes Adequate lighting in your home to reduce risk of falls?: Yes Life alert?: No Use of a cane, walker or w/c?: No Grab bars in the bathroom?: Yes Shower chair or bench in shower?: Yes Elevated toilet seat or a handicapped toilet?: Yes  TIMED UP AND GO:  Was the test performed?  No    Cognitive Function:        01/18/2023    1:07 PM 01/16/2022    3:12 PM  6CIT Screen  What Year? 0 points 0 points  What month? 0 points 0 points  What time? 0 points 0 points  Count back from 20 0 points 0 points  Months in reverse 0 points 0  points  Repeat phrase 0 points 2 points  Total Score 0 points 2 points    Immunizations Immunization History  Administered Date(s) Administered   Fluad Quad(high Dose 65+) 02/09/2019, 06/11/2020, 01/09/2021, 02/04/2022   Influenza, High Dose Seasonal PF 03/23/2018   Influenza,inj,Quad PF,6+ Mos 03/01/2013, 02/14/2014, 03/08/2015, 04/16/2016, 01/12/2017   Influenza-Unspecified 01/18/2017   Pneumococcal Conjugate-13 03/30/2013   Pneumococcal Polysaccharide-23 04/04/2014, 07/09/2020   Tdap 09/24/2010    TDAP status: Due, Education has been provided regarding the importance of this vaccine. Advised may receive this vaccine at local pharmacy or Health Dept. Aware to provide a copy of the vaccination record if obtained from local pharmacy or Health Dept. Verbalized acceptance and understanding.  Flu Vaccine status: Due, Education has been provided regarding the importance of this vaccine. Advised may receive this vaccine at local pharmacy or Health Dept. Aware to provide a copy of the vaccination record if obtained from local pharmacy or Health Dept. Verbalized acceptance and understanding.  Pneumococcal vaccine status: Up to date  Covid-19 vaccine status: Declined, Education has been provided regarding the importance of this vaccine but patient still declined. Advised may receive this vaccine at local pharmacy or Health Dept.or vaccine clinic. Aware to provide a copy of the vaccination record if obtained from local pharmacy or Health Dept. Verbalized acceptance and understanding.  Qualifies for Shingles Vaccine? Yes   Zostavax completed No   Shingrix Completed?: No.    Education has been provided regarding the importance of this vaccine. Patient has been advised to call insurance company to determine out of pocket expense if they have not yet received this vaccine. Advised may also receive vaccine at local pharmacy or Health Dept. Verbalized acceptance and understanding.  Screening  Tests Health Maintenance  Topic Date Due   Zoster Vaccines- Shingrix (1 of 2) Never done   MAMMOGRAM  03/04/2018   DTaP/Tdap/Td (2 - Td or Tdap) 09/23/2020   OPHTHALMOLOGY EXAM  09/23/2022   INFLUENZA VACCINE  11/19/2022   Colonoscopy  02/05/2023 (Originally 11/16/2020)   HEMOGLOBIN A1C  01/23/2023   Diabetic kidney evaluation - Urine ACR  07/24/2023   FOOT EXAM  07/24/2023   Diabetic kidney evaluation - eGFR measurement  12/17/2023   Medicare Annual Wellness (AWV)  01/18/2024   Pneumonia Vaccine 94+ Years old  Completed   DEXA SCAN  Completed   Hepatitis C Screening  Completed   HPV VACCINES  Aged Out   COVID-19 Vaccine  Discontinued    Health Maintenance  Health Maintenance Due  Topic Date Due   Zoster Vaccines- Shingrix (1 of 2) Never done   MAMMOGRAM  03/04/2018   DTaP/Tdap/Td (2 - Td or Tdap) 09/23/2020   OPHTHALMOLOGY EXAM  09/23/2022   INFLUENZA VACCINE  11/19/2022    Colorectal cancer screening: Type of screening: FOBT/FIT. Completed 12/18/2022. Repeat every 1 years  Mammogram status: Completed 2024 at OB/GYN Office. Repeat every year  Bone Density status: Completed 3/127/2013. Results reflect: Bone density results: NORMAL. Repeat every 5 years. Patient declined  Lung Cancer Screening: (Low Dose CT Chest recommended if Age 61-80 years, 20 pack-year currently smoking OR have quit w/in 15years.) does not qualify.   Lung Cancer Screening Referral: no  Additional Screening:  Hepatitis C Screening: does qualify; Completed 10/16/2015  Vision Screening: Recommended annual ophthalmology exams for early detection of glaucoma and other disorders of the eye. Is the patient up to date with their annual eye exam?  Yes  Who is the provider or what is the name of the office in which the patient attends annual eye exams? Maris Berger, MD. If pt is not established with a provider, would they like to be referred to a provider to establish care? No .   Dental Screening:  Recommended annual dental exams for proper oral hygiene  Diabetic Foot Exam: 07/24/2022  Community Resource Referral / Chronic Care Management: CRR required this visit?  No   CCM required this visit?  No     Plan:     I have personally reviewed and noted the following in the patient's chart:   Medical and social history Use of alcohol, tobacco or illicit drugs  Current medications and supplements including opioid prescriptions. Patient is not currently taking opioid prescriptions. Functional ability and status Nutritional status Physical activity Advanced directives List of other physicians Hospitalizations, surgeries, and ER visits in previous 12 months Vitals Screenings to include cognitive, depression, and falls Referrals and appointments  In addition, I have reviewed and discussed with patient certain preventive protocols, quality metrics, and best practice recommendations. A written personalized care plan for preventive services as well as general preventive health recommendations were provided to patient.     Mickeal Needy, LPN   [id number]/[id number]   After Visit Summary: (MyChart) Due to this being a telephonic visit, the after visit summary with patients personalized plan was offered to patient via MyChart   Nurse Notes: Normal cognitive status assessed by direct observation via telephone conversation by this Nurse Health Advisor. No abnormalities found.

## 2023-01-19 ENCOUNTER — Telehealth: Payer: Self-pay | Admitting: Emergency Medicine

## 2023-01-19 DIAGNOSIS — R053 Chronic cough: Secondary | ICD-10-CM

## 2023-01-19 NOTE — Telephone Encounter (Addendum)
Pt needs a refill for ipratropium-albuterol (DUONEB) 0.5-2.5 (3) MG/3ML SOLN  and ipratropium (ATROVENT) 0.02 % nebulizer solution  Pharmacy: [id number] - [place], [place] - [address]

## 2023-01-21 MED ORDER — IPRATROPIUM-ALBUTEROL 0.5-2.5 (3) MG/3ML IN SOLN
3.0000 mL | Freq: Four times a day (QID) | RESPIRATORY_TRACT | 0 refills | Status: DC | PRN
Start: 2023-01-21 — End: 2023-08-04

## 2023-01-21 NOTE — Telephone Encounter (Signed)
Rx sent to pharmacy   

## 2023-01-25 ENCOUNTER — Telehealth: Payer: Self-pay | Admitting: Emergency Medicine

## 2023-01-25 NOTE — Telephone Encounter (Signed)
Patient wants to have the paperwork filled out so she can get her Breztri inhaler renewed and for free. Patient would like a call back.

## 2023-01-27 ENCOUNTER — Other Ambulatory Visit: Payer: Self-pay

## 2023-01-27 ENCOUNTER — Emergency Department (HOSPITAL_COMMUNITY)
Admission: EM | Admit: 2023-01-27 | Discharge: 2023-01-28 | Disposition: A | Discharge status: Home / Self Care | Payer: Medicare Other | Attending: Emergency Medicine | Admitting: Emergency Medicine

## 2023-01-27 DIAGNOSIS — Z7984 Long term (current) use of oral hypoglycemic drugs: Secondary | ICD-10-CM | POA: Diagnosis not present

## 2023-01-27 DIAGNOSIS — S99921A Unspecified injury of right foot, initial encounter: Secondary | ICD-10-CM | POA: Diagnosis not present

## 2023-01-27 DIAGNOSIS — S92421B Displaced fracture of distal phalanx of right great toe, initial encounter for open fracture: Secondary | ICD-10-CM | POA: Diagnosis not present

## 2023-01-27 DIAGNOSIS — D649 Anemia, unspecified: Secondary | ICD-10-CM | POA: Diagnosis not present

## 2023-01-27 DIAGNOSIS — S92531A Displaced fracture of distal phalanx of right lesser toe(s), initial encounter for closed fracture: Secondary | ICD-10-CM | POA: Diagnosis not present

## 2023-01-27 DIAGNOSIS — Z23 Encounter for immunization: Secondary | ICD-10-CM | POA: Insufficient documentation

## 2023-01-27 DIAGNOSIS — M19071 Primary osteoarthritis, right ankle and foot: Secondary | ICD-10-CM | POA: Diagnosis not present

## 2023-01-27 DIAGNOSIS — W1830XA Fall on same level, unspecified, initial encounter: Secondary | ICD-10-CM | POA: Insufficient documentation

## 2023-01-27 DIAGNOSIS — Z7901 Long term (current) use of anticoagulants: Secondary | ICD-10-CM | POA: Insufficient documentation

## 2023-01-27 DIAGNOSIS — E119 Type 2 diabetes mellitus without complications: Secondary | ICD-10-CM | POA: Diagnosis not present

## 2023-01-27 LAB — BASIC METABOLIC PANEL
Anion gap: 11 (ref 5–15)
BUN: 14 mg/dL (ref 8–23)
CO2: 22 mmol/L (ref 22–32)
Calcium: 8.3 mg/dL — ABNORMAL LOW (ref 8.9–10.3)
Chloride: 100 mmol/L (ref 98–111)
Creatinine, Ser: 0.95 mg/dL (ref 0.44–1.00)
GFR, Estimated: >60 mL/min (ref >60)
Glucose, Bld: 185 mg/dL — ABNORMAL HIGH (ref 70–99)
Potassium: 3.3 mmol/L — ABNORMAL LOW (ref 3.5–5.1)
Sodium: 133 mmol/L — ABNORMAL LOW (ref 135–145)

## 2023-01-27 LAB — CBC
HCT: 28 % — ABNORMAL LOW (ref 36.0–46.0)
Hemoglobin: 7.7 g/dL — ABNORMAL LOW (ref 12.0–15.0)
MCH: 21 pg — ABNORMAL LOW (ref 26.0–34.0)
MCHC: 27.5 g/dL — ABNORMAL LOW (ref 30.0–36.0)
MCV: 76.5 fL — ABNORMAL LOW (ref 80.0–100.0)
Platelets: 414 10*3/uL — ABNORMAL HIGH (ref 150–400)
RBC: 3.66 MIL/uL — ABNORMAL LOW (ref 3.87–5.11)
RDW: 16.8 % — ABNORMAL HIGH (ref 11.5–15.5)
WBC: 6.8 10*3/uL (ref 4.0–10.5)
nRBC: 0 % (ref 0.0–0.2)

## 2023-01-27 LAB — CBG MONITORING, ED: Glucose-Capillary: 173 mg/dL — ABNORMAL HIGH (ref 70–99)

## 2023-01-27 NOTE — ED Provider Triage Note (Signed)
Emergency Medicine Provider Triage Evaluation Note  Andrea Perry , a 72 y.o. female  was evaluated in triage.  Pt complains of who presents with concern of feeling shaky for the past couple of days and not eating as well.  Reports her knees gave out on her yesterday walking to the kitchen and she sustained a injury to her right toe.  Patient is on Eliquis.  Denies hitting head, no loss of consciousness.   Denies any fever, shortness of breath, cough, sore throat, chest pain. Review of Systems  Positive: As above Negative: As above  Physical Exam  BP (!) 155/72 (BP Location: Left Arm)   Pulse 97   Temp 98.3 F (36.8 C) (Oral)   Resp 18   SpO2 100%  Gen:   Awake, no distress   Resp:  Normal effort  MSK:   Moves extremities without difficulty    Medical Decision Making  Medically screening exam initiated at 8:13 PM.  Appropriate orders placed.  Andrea Perry was informed that the remainder of the evaluation will be completed by another provider, this initial triage assessment does not replace that evaluation, and the importance of remaining in the ED until their evaluation is complete.     Arabella Merles, PA-C 01/27/23 2019

## 2023-01-27 NOTE — ED Triage Notes (Addendum)
Patient stated she fell lastnight and hurt her bug toe on the right foot. Denies hitting her head. No LOC. Patient takes eliquis. Patient states all of sudden she just fell. She is not sure how she did it. She said she occasionally has weak knees. Pain is rated 2/10.  Patient also states she has a spot on her neck she wants looked at and she has had a decrease in appetite and only ate an oatmeal cookie and diet soda today.

## 2023-01-28 ENCOUNTER — Telehealth: Payer: Self-pay

## 2023-01-28 ENCOUNTER — Ambulatory Visit: Payer: Medicare Other | Admitting: Internal Medicine

## 2023-01-28 ENCOUNTER — Emergency Department (HOSPITAL_COMMUNITY): Payer: Medicare Other

## 2023-01-28 DIAGNOSIS — S92531A Displaced fracture of distal phalanx of right lesser toe(s), initial encounter for closed fracture: Secondary | ICD-10-CM | POA: Diagnosis not present

## 2023-01-28 DIAGNOSIS — M19071 Primary osteoarthritis, right ankle and foot: Secondary | ICD-10-CM | POA: Diagnosis not present

## 2023-01-28 DIAGNOSIS — S92421A Displaced fracture of distal phalanx of right great toe, initial encounter for closed fracture: Secondary | ICD-10-CM | POA: Diagnosis not present

## 2023-01-28 MED ORDER — AMOXICILLIN-POT CLAVULANATE 875-125 MG PO TABS
1.0000 | ORAL_TABLET | Freq: Once | ORAL | Status: AC
  Administered 2023-01-28: 1 via ORAL
  Filled 2023-01-28: qty 1

## 2023-01-28 MED ORDER — AMOXICILLIN-POT CLAVULANATE 875-125 MG PO TABS: 1.0000 | ORAL_TABLET | Freq: Two times a day (BID) | ORAL | 0 refills | Status: DC

## 2023-01-28 MED ORDER — TETANUS-DIPHTHERIA TOXOIDS TD 5-2 LFU IM INJ
0.5000 mL | INJECTION | Freq: Once | INTRAMUSCULAR | Status: AC
  Administered 2023-01-28: 0.5 mL via INTRAMUSCULAR
  Filled 2023-01-28: qty 0.5

## 2023-01-28 NOTE — Telephone Encounter (Signed)
Pt is sch for 02/04/2023 with Dr. Lajoyce Corners

## 2023-01-28 NOTE — ED Provider Notes (Signed)
[place] EMERGENCY DEPARTMENT AT [facility] Provider Note   CSN: [id number] Arrival date & time: 01/27/23  1927     History  Chief Complaint  Patient presents with   Fall   Toe Injury    Andrea Perry is a 72 y.o. female.  The history is provided by the patient, a relative and medical records.  Fall  Andrea Perry is a 72 y.o. female who presents to the Emergency Department complaining of toe injury.  She presents to the emergency department for evaluation of right great toe injury that occurred on Tuesday night when she had a fall going into the kitchen.  She wrapped up the wound but did have intermittent bleeding after the fall occurred.  She presents to the emergency department for evaluation by her son due to degree of wound and ongoing intermittent bleeding.  She is still able to walk.  She does take Eliquis for history of A-fib.  She is not having any black or bloody stools.  No recent illnesses.  No chest pain, difficulty breathing, nausea, vomiting.  She is a diabetic as well. She did not strike her head in the fall.  She has been able to ambulate without difficulty since the fall.    Home Medications Prior to Admission medications   Medication Sig Start Date End Date Taking? Authorizing Provider  amoxicillin-clavulanate (AUGMENTIN) 875-125 MG tablet Take 1 tablet by mouth every 12 (twelve) hours. 01/28/23  Yes Tilden Fossa, MD  acetaminophen (TYLENOL) 500 MG tablet Take 500 mg by mouth 2 (two) times daily.    [provider]  albuterol (PROVENTIL) (2.5 MG/3ML) 0.083% nebulizer solution USE EVERY 4 HOURS WITH IPRATROPIUM 04/21/22   Leslye Peer, MD  amLODipine (NORVASC) 10 MG tablet Take 1 tablet (10 mg total) by mouth daily. 02/04/22   Corwin Levins, MD  apixaban Everlene Balls) 5 MG TABS tablet Take 1 tablet by mouth twice daily 07/24/22   Wendall Stade, MD  Blood Glucose Monitoring Suppl (FREESTYLE LITE) DEVI Use as directed daily E11.9 09/26/18   Corwin Levins, MD  Budeson-Glycopyrrol-Formoterol (BREZTRI AEROSPHERE) 160-9-4.8 MCG/ACT AERO Inhale 2 puffs into the lungs 2 (two) times daily. 01/08/23   Leslye Peer, MD  cetirizine (ZYRTEC) 10 MG tablet Take 10 mg by mouth daily.    [provider]  Cholecalciferol 50 MCG (2000 UT) TABS 1 tab by mouth once daily 02/05/22   Corwin Levins, MD  cyclobenzaprine (FLEXERIL) 5 MG tablet     [provider]  dimenhyDRINATE (DRAMAMINE) 50 MG tablet Take by mouth every 8 (eight) hours as needed. 05/26/16   [provider]  doxycycline (VIBRA-TABS) 100 MG tablet Take 1 tablet (100 mg total) by mouth 2 (two) times daily. 10/02/22   Leslye Peer, MD  escitalopram (LEXAPRO) 20 MG tablet Take 1 tablet (20 mg total) by mouth daily. Patient taking differently: Take 20 mg by mouth at bedtime. 02/04/22   Corwin Levins, MD  estradiol (ESTRACE) 1 MG tablet Take 1 mg by mouth daily. 12/18/18   [provider]  ferrous sulfate 325 (65 FE) MG tablet Take 1 tablet (325 mg total) by mouth daily. 12/18/22   Horton, Mayer Masker, MD  fluorouracil (EFUDEX) 5 % cream Apply topically. 11/06/15   [provider]  furosemide (LASIX) 40 MG tablet Take 1 tablet (40 mg total) by mouth daily. 02/04/22   Corwin Levins, MD  glucose blood (FREESTYLE LITE) test strip  Use as instructed once daily E11.9 09/26/18   Corwin Levins, MD  GUAIFENESIN ER PO Take 600 mg by mouth daily as needed for cough.    [provider]  ipratropium-albuterol (DUONEB) 0.5-2.5 (3) MG/3ML SOLN Take 3 mLs by nebulization every 6 (six) hours as needed. 01/21/23   Leslye Peer, MD  irbesartan (AVAPRO) 150 MG tablet Take 1 tablet (150 mg total) by mouth daily. 02/04/22   Corwin Levins, MD  ketoconazole (NIZORAL) 2 % cream     [provider]  Lancets MISC Use as directed once daily E11.9 09/26/18   Corwin Levins, MD  levothyroxine (SYNTHROID) 50 MCG tablet Take 1 tablet (50 mcg total) by mouth daily. 02/05/22    Corwin Levins, MD  LORazepam (ATIVAN) 1 MG tablet Take 1 tablet by mouth twice daily as needed for anxiety 09/07/22   Corwin Levins, MD  Magnesium Oxide -Mg Supplement 500 MG CAPS 1 tab by mouth twice per day Patient taking differently: Take 1 capsule by mouth in the morning. 1 tab by mouth twice per day 02/05/22   Corwin Levins, MD  metFORMIN (GLUCOPHAGE-XR) 500 MG 24 hr tablet TAKE 2 TABLETS BY MOUTH ONCE DAILY WITH BREAKFAST 02/04/22   Corwin Levins, MD  metoprolol tartrate (LOPRESSOR) 100 MG tablet Take 1 tablet (100 mg total) by mouth 2 (two) times daily. 08/05/22   Wendall Stade, MD  pantoprazole (PROTONIX) 40 MG tablet Take 1 tablet by mouth once daily 09/07/22   Corwin Levins, MD  potassium chloride SA (KLOR-CON) 20 MEQ tablet Take 1 tablet (20 mEq total) by mouth daily. 06/14/20   Joycelyn Das, MD  promethazine-dextromethorphan (PROMETHAZINE-DM) 6.25-15 MG/5ML syrup     [provider]  rosuvastatin (CRESTOR) 40 MG tablet Take 1 tablet by mouth once daily 09/02/22   Corwin Levins, MD  TETRAHYDROZOLINE HCL OP Apply to eye. 05/26/16   [provider]  tiZANidine (ZANAFLEX) 4 MG tablet Take 1 tablet (4 mg total) by mouth every 6 (six) hours as needed for muscle spasms. 07/18/21   Corwin Levins, MD  tolterodine (DETROL LA) 4 MG 24 hr capsule Take 1 capsule (4 mg total) by mouth daily. 02/04/22   Corwin Levins, MD  vitamin E 180 MG (400 UNITS) capsule Take by mouth. 05/26/16   [provider]      Allergies    Tramadol hcl, Other, Codeine, Tape, and Vicodin [hydrocodone-acetaminophen]    Review of Systems   Review of Systems  All other systems reviewed and are negative.   Physical Exam Updated Vital Signs BP 138/73   Pulse 91   Temp 98.1 F (36.7 C)   Resp 12   SpO2 100%  Physical Exam Vitals and nursing note reviewed.  Constitutional:      Appearance: She is well-developed.  HENT:     Head: Normocephalic and atraumatic.  Cardiovascular:     Rate and  Rhythm: Normal rate and regular rhythm.  Pulmonary:     Effort: Pulmonary effort is normal. No respiratory distress.  Musculoskeletal:     Comments: 2+ DP pulses bilaterally.  Pitting edema to bilateral lower extremities.  There is a laceration over the right great toe that involves the nailbed that is approximately 3 cm long.  It is hemostatic.  She does have tenderness in this region, no foot tenderness to palpation.  Skin:    General: Skin is warm and dry.  Neurological:  Mental Status: She is alert and oriented to person, place, and time.  Psychiatric:        Behavior: Behavior normal.     ED Results / Procedures / Treatments   Labs (all labs ordered are listed, but only abnormal results are displayed) Labs Reviewed  CBC - Abnormal; Notable for the following components:      Result Value   RBC 3.66 (*)    Hemoglobin 7.7 (*)    HCT 28.0 (*)    MCV 76.5 (*)    MCH 21.0 (*)    MCHC 27.5 (*)    RDW 16.8 (*)    Platelets 414 (*)    All other components within normal limits  BASIC METABOLIC PANEL - Abnormal; Notable for the following components:   Sodium 133 (*)    Potassium 3.3 (*)    Glucose, Bld 185 (*)    Calcium 8.3 (*)    All other components within normal limits  CBG MONITORING, ED - Abnormal; Notable for the following components:   Glucose-Capillary 173 (*)    All other components within normal limits    EKG None  Radiology DG Toe Great Right  Result Date: 01/28/2023 CLINICAL DATA:  Fall, toe injury. EXAM: RIGHT GREAT TOE COMPARISON:  None Available. FINDINGS: There is a fracture of the tuft of the distal phalanx of the first digit with plantar displacement of the distal fracture fragment. Degenerative changes are present in the first metatarsophalangeal joint. A defect is noted at the first toenail. IMPRESSION: Displaced fracture of the tuft of the distal phalanx of the first digit. Electronically Signed   By: Thornell Sartorius M.D.   On: 01/28/2023 04:02     Procedures Procedures    Medications Ordered in ED Medications  amoxicillin-clavulanate (AUGMENTIN) 875-125 MG per tablet 1 tablet (has no administration in time range)  tetanus & diphtheria toxoids (adult) (TENIVAC) injection 0.5 mL (0.5 mLs Intramuscular Given 01/28/23 6045)    ED Course/ Medical Decision Making/ A&P                                 Medical Decision Making Amount and/or Complexity of Data Reviewed Radiology: ordered.  Risk Prescription drug management.   Patient with history of diabetes, A-fib on anticoagulation here for evaluation of right great toe injury that occurred on Tuesday.  She does have a laceration to the distal great toe that involves the nailbed.  This is an old wound that is currently hemostatic.  She does have an underlying fracture concern for open fracture.  Her tetanus was updated.  Wound was cleaned multiple times at bedside by staff.  Discussed with Dr. Linna Caprice with orthopedics-recommend starting Augmentin with referral to Dr. Lajoyce Corners.  There is no current evidence of infection on examination.  She does have good perfusion to her foot.  Of note her hemoglobin is down to 7.7 today, no reported hematochezia or melena.  She did have bleeding at home intermittently from her toe.  She is asymptomatic in terms of her anemia.  Discussed anemia precautions.  Discussed orthopedics follow-up as well as return precautions for infection.  Discussed importance of outpatient follow-up.        Final Clinical Impression(s) / ED Diagnoses Final diagnoses:  Open displaced fracture of distal phalanx of right great toe, initial encounter  Anemia, unspecified type    Rx / DC Orders ED Discharge Orders  Ordered    amoxicillin-clavulanate (AUGMENTIN) 875-125 MG tablet  Every 12 hours        01/28/23 0436              Tilden Fossa, MD 01/28/23 [phone]

## 2023-01-28 NOTE — Telephone Encounter (Signed)
I called pt and offered an appt with Dr. Lajoyce Corners for next week. Offered several days and she said that she will need to check with her son and see if she could make arrangements.

## 2023-02-04 ENCOUNTER — Encounter: Payer: Self-pay | Admitting: Orthopedic Surgery

## 2023-02-04 ENCOUNTER — Ambulatory Visit: Payer: Medicare Other | Admitting: Orthopedic Surgery

## 2023-02-04 DIAGNOSIS — S92424B Nondisplaced fracture of distal phalanx of right great toe, initial encounter for open fracture: Secondary | ICD-10-CM | POA: Diagnosis not present

## 2023-02-04 MED ORDER — MUPIROCIN 2 % EX OINT: 1.0000 | TOPICAL_OINTMENT | Freq: Two times a day (BID) | CUTANEOUS | 3 refills | Status: DC

## 2023-02-04 MED ORDER — DOXYCYCLINE HYCLATE 100 MG PO TABS
100.0000 mg | ORAL_TABLET | Freq: Two times a day (BID) | ORAL | 0 refills | Status: DC
Start: 2023-02-04 — End: 2023-09-15

## 2023-02-04 NOTE — Progress Notes (Signed)
Office Visit Note   Patient: Andrea Perry           Date of Birth: [birth year]           MRN: [id number] Visit Date: 02/04/2023              Requested by: Corwin Levins, MD [address] [place],  [place] [address] PCP: Corwin Levins, MD  Chief Complaint  Patient presents with   Right Foot - Follow-up      HPI: Patient is a 72 year old woman who is seen for initial evaluation for an open fracture of the tuft of the right great toe.  Patient sustained the fracture on the eighth went to the emergency room on the ninth.  She is currently on Eliquis and has completed a course of Augmentin.  Assessment & Plan: Visit Diagnoses:  1. Open nondisplaced fracture of distal phalanx of right great toe, initial encounter     Plan: Discussed open fracture with concern for osteomyelitis.  We will place her on doxycycline and Bactroban, dressing changes continue with her postoperative shoe.  Follow-Up Instructions: Return in about 3 weeks (around 02/25/2023).   Ortho Exam  Patient is alert, oriented, no adenopathy, well-dressed, normal affect, normal respiratory effort. Examination patient has a palpable dorsalis pedis pulse.  She has a healed transverse laceration across the mid aspect of the nailbed where she sustained the open fracture.  There is no ascending cellulitis no sausage digit swelling no drainage.  Clinically patient had a large open fracture.  Most recent hemoglobin A1c 6.7.  Imaging: No results found. No images are attached to the encounter.  Labs: Lab Results  Component Value Date   HGBA1C 6.7 (H) 07/24/2022   HGBA1C 7.0 (H) 02/04/2022   HGBA1C 7.1 (H) 07/18/2021   CRP 1.6 (H) 05/25/2020   CRP 4.0 (H) 05/24/2020   CRP 7.9 (H) 05/23/2020     Lab Results  Component Value Date   ALBUMIN 3.9 07/24/2022   ALBUMIN 3.1 (L) 04/20/2022   ALBUMIN 3.6 02/04/2022    Lab Results  Component Value Date   MG 1.3 (L) 02/04/2022   MG 1.3 (L) 07/18/2021   MG 2.0  01/09/2021   Lab Results  Component Value Date   VD25OH 24.31 (L) 07/24/2022   VD25OH 25.33 (L) 02/04/2022   VD25OH 24.06 (L) 07/18/2021    No results found for: "PREALBUMIN"    Latest Ref Rng & Units 01/27/2023    8:26 PM 12/18/2022    1:45 AM 12/17/2022    5:27 PM  CBC EXTENDED  WBC 4.0 - 10.5 K/uL 6.8   7.9   RBC 3.87 - 5.11 MIL/uL 3.66   3.59   Hemoglobin 12.0 - 15.0 g/dL 7.7  8.9  8.2   HCT 09.3 - 46.0 % 28.0  30.5  27.8   Platelets 150 - 400 K/uL 414   374      There is no height or weight on file to calculate BMI.  Orders:  No orders of the defined types were placed in this encounter.  No orders of the defined types were placed in this encounter.    Procedures: No procedures performed  Clinical Data: No additional findings.  ROS:  All other systems negative, except as noted in the HPI. Review of Systems  Objective: Vital Signs: There were no vitals taken for this visit.  Specialty Comments:  No specialty comments available.  PMFS History: Patient Active Problem List   Diagnosis  Date Noted   CKD stage 3a, GFR 45-59 ml/min (HCC) 08/06/2022   Abnormal urine 08/06/2022   Urinary tract infectious disease 08/05/2022   Exposure to COVID-19 virus 07/26/2022   COPD exacerbation (HCC) 04/22/2022   Acute respiratory failure with hypoxemia (HCC) 04/21/2022   RSV (respiratory syncytial virus pneumonia) 04/21/2022   Anxiety and depression 04/21/2022   Acute respiratory failure (HCC) 04/21/2022   Dysuria 01/12/2021   Vitamin D deficiency 07/15/2020   Low grade squamous intraepithelial lesion (LGSIL) on cervicovaginal cytologic smear 07/09/2020   Menopausal syndrome 07/09/2020   Stress incontinence of urine 07/09/2020   Muscle cramps 06/18/2020   Hypomagnesemia 06/11/2020   Physical deconditioning 06/03/2020   COVID-19 05/21/2020   CHF (congestive heart failure) (HCC) 05/21/2020   Suspected COVID-19 virus infection 05/09/2020   Complex care coordination  02/27/2019   Healthcare maintenance 02/09/2019   Medication management 12/01/2018   Shortness of breath 12/01/2018   Former smoker 12/01/2018   Atrial fibrillation, rapid (HCC) 04/29/2018   Acute gastroenteritis 03/23/2018   GERD (gastroesophageal reflux disease) 01/03/2018   Proteinuria 09/15/2017   Insomnia 03/17/2017   Rash 11/04/2016   Hematoma 11/04/2016   Pre-operative examination for internal medicine 09/11/2016   Back wound, unspecified laterality, initial encounter 05/26/2016   S/P lumbar laminectomy 03/19/2016   Overactive bladder 10/20/2015   Pulmonary HTN (HCC) 06/19/2015   Mild obstructive sleep apnea 06/19/2015   Snoring 06/19/2015   Abdominal pain 04/18/2015   Cough 03/08/2015   A-fib (HCC) 02/05/2014   Preop cardiovascular exam 02/05/2014   Arthritis of knee 02/05/2014   Arm pain, right 08/31/2013   Low back pain 09/26/2011   Leg pain 07/07/2011   Dizziness 07/07/2011   Menopausal disorder 07/07/2011   COPD with acute exacerbation (HCC) 02/25/2011   Hemorrhage of rectum and anus 11/04/2010   Internal thrombosed hemorrhoids 11/04/2010   Internal hemorrhoids with other complication 11/04/2010   External hemorrhoids 11/04/2010   Dyspepsia 09/24/2010   Peripheral edema 09/24/2010   Diabetes (HCC) 09/21/2010   Encounter for well adult exam with abnormal findings 09/21/2010   IBS 06/20/2009   HEMATOCHEZIA 06/20/2009   Elevated lipids 08/16/2008   Anxiety state 08/16/2008   Depression 08/16/2008   Allergic rhinitis 08/16/2008   Asthma 08/16/2008   COPD (chronic obstructive pulmonary disease) (HCC) 08/16/2008   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   GERD (gastroesophageal reflux disease)    H/O hiatal hernia    HEMATOCHEZIA 06/20/2009   Hemorrhoids    History of cardioversion 10/30/14   History of kidney stones    HYPERLIPIDEMIA 08/16/2008   HYPERTENSION 12/03/2006   Hypertension    Impaired glucose tolerance 09/21/2010   Overactive bladder 10/20/2015   Pneumonia    as a child   Pulmonary HTN (HCC) 06/19/2015   Shortness of breath dyspnea  Sleep apnea    MILD NO CPAP ORDERED   Snoring 06/19/2015    Family History  Problem Relation Age of Onset   Diabetes Mother    Hyperlipidemia Mother    Hypertension Mother    Colon polyps Mother    Stroke Father    Colon cancer Neg Hx     Past Surgical History:  Procedure Laterality Date   ABDOMINAL HYSTERECTOMY     APPENDECTOMY     CARDIOVERSION N/A 03/20/2014   Procedure: CARDIOVERSION;  Surgeon: Wendall Stade, MD;  Location: [facility] ENDOSCOPY;  Service: Cardiovascular;  Laterality: N/A;   CARDIOVERSION N/A 10/30/2014   Procedure: CARDIOVERSION;  Surgeon: Wendall Stade, MD;  Location: [facility] ENDOSCOPY;  Service: Cardiovascular;  Laterality: N/A;   CARDIOVERSION N/A 04/03/2019   Procedure: CARDIOVERSION;  Surgeon: Lars Masson, MD;  Location: [facility] ENDOSCOPY;  Service: Cardiovascular;  Laterality: N/A;   CARPAL TUNNEL RELEASE     Right + LEFT   CESAREAN SECTION     x 1    COLONOSCOPY  2004   CYST EXCISION     RT ARM    CYSTOCELE REPAIR N/A 10/25/2012   Procedure: ANTERIOR REPAIR (CYSTOCELE);  Surgeon: Miguel Aschoff, MD;  Location: [facility] ORS;  Service: Gynecology;  Laterality: N/A;   HEEL SPUR SURGERY Bilateral    KNEE SURGERY     Right + LEFT   LUMBAR LAMINECTOMY/DECOMPRESSION MICRODISCECTOMY N/A 03/19/2016   Procedure: Laminectomy and Foraminotomy - Lumbar three-four - Lumbar  four-five;  Surgeon: Tia Alert, MD;  Location: [facility] OR;  Service: Neurosurgery;  Laterality: N/A;   RADIOLOGY WITH ANESTHESIA Right 11/01/2014   Procedure: MRI RIGHT FOREARM;  Surgeon: Medication Radiologist, MD;  Location: [facility] OR;  Service: Radiology;  Laterality: Right;   RADIOLOGY WITH ANESTHESIA Right 08/29/2015   Procedure: MRI - RIGHT FOREARM;  Surgeon: Medication Radiologist, MD;  Location: [facility] OR;  Service: Radiology;  Laterality: Right;   RADIOLOGY WITH ANESTHESIA N/A 12/05/2015   Procedure: MRI SPINE WITHOUT;  Surgeon: Medication Radiologist, MD;  Location: [facility] OR;  Service: Radiology;  Laterality: N/A;   SKIN CANCER EXCISION     BILAT SHOULDERS   SPLIT NIGHT STUDY  09/04/2015   TONSILLECTOMY AND ADENOIDECTOMY     Social History   Occupational History   Occupation: currently unemployed, former [employer] (medical)  Tobacco Use   Smoking status: Former    Current packs/day: 0.00    Average packs/day: 1.5 packs/day for 30.0 years (45.0 ttl pk-yrs)    Types: Cigarettes    Start date: 04/20/1973    Quit date: 04/21/2003    Years since quitting: 19.8   Smokeless tobacco: Never  Vaping Use   Vaping status: Never Used  Substance and Sexual Activity   Alcohol use: Yes    Alcohol/week: 2.0 standard drinks of alcohol    Types: 2 Standard drinks or equivalent per week    Comment: OCCAS   Drug use: No   Sexual activity: Not Currently    Birth control/protection: None

## 2023-02-25 ENCOUNTER — Ambulatory Visit (INDEPENDENT_AMBULATORY_CARE_PROVIDER_SITE_OTHER): Payer: Medicare Other | Admitting: Orthopedic Surgery

## 2023-02-25 DIAGNOSIS — S92424B Nondisplaced fracture of distal phalanx of right great toe, initial encounter for open fracture: Secondary | ICD-10-CM | POA: Diagnosis not present

## 2023-03-01 ENCOUNTER — Encounter: Payer: Self-pay | Admitting: Orthopedic Surgery

## 2023-03-01 NOTE — Progress Notes (Signed)
Office Visit Note   Patient: Andrea Perry           Date of Birth: 1950/07/31           MRN: [id number] Visit Date: 02/25/2023              Requested by: Corwin Levins, MD [address] [place],  [place] [address] PCP: Corwin Levins, MD  Chief Complaint  Patient presents with   Right Foot - Wound Check     And dressing changes. HPI: Patient is a 72 year old woman who presents in follow-up status post open fracture right great toe.  Patient has completed her course of doxycycline and has been using Bactroban.  She is in a postoperative shoe.  Assessment & Plan: Visit Diagnoses:  1. Open nondisplaced fracture of distal phalanx of right great toe, initial encounter     Plan: Patient will continue with her postoperative shoe continue with Bactroban  Follow-Up Instructions: Return in about 3 weeks (around 03/18/2023).   Ortho Exam  Patient is alert, oriented, no adenopathy, well-dressed, normal affect, normal respiratory effort. Examination there is no swelling.  No sausage digit swelling.  She does have some redness with granulation tissue there is no drainage.  No exposed bone.  Imaging: No results found. No images are attached to the encounter.  Labs: Lab Results  Component Value Date   HGBA1C 6.7 (H) 07/24/2022   HGBA1C 7.0 (H) 02/04/2022   HGBA1C 7.1 (H) 07/18/2021   CRP 1.6 (H) 05/25/2020   CRP 4.0 (H) 05/24/2020   CRP 7.9 (H) 05/23/2020     Lab Results  Component Value Date   ALBUMIN 3.9 07/24/2022   ALBUMIN 3.1 (L) 04/20/2022   ALBUMIN 3.6 02/04/2022    Lab Results  Component Value Date   MG 1.3 (L) 02/04/2022   MG 1.3 (L) 07/18/2021   MG 2.0 01/09/2021   Lab Results  Component Value Date   VD25OH 24.31 (L) 07/24/2022   VD25OH 25.33 (L) 02/04/2022   VD25OH 24.06 (L) 07/18/2021    No results found for: "PREALBUMIN"    Latest Ref Rng & Units 01/27/2023    8:26 PM 12/18/2022    1:45 AM 12/17/2022    5:27 PM  CBC EXTENDED  WBC 4.0 - 10.5  K/uL 6.8   7.9   RBC 3.87 - 5.11 MIL/uL 3.66   3.59   Hemoglobin 12.0 - 15.0 g/dL 7.7  8.9  8.2   HCT 62.1 - 46.0 % 28.0  30.5  27.8   Platelets 150 - 400 K/uL 414   374      There is no height or weight on file to calculate BMI.  Orders:  No orders of the defined types were placed in this encounter.  No orders of the defined types were placed in this encounter.    Procedures: No procedures performed  Clinical Data: No additional findings.  ROS:  All other systems negative, except as noted in the HPI. Review of Systems  Objective: Vital Signs: There were no vitals taken for this visit.  Specialty Comments:  No specialty comments available.  PMFS History: Patient Active Problem List   Diagnosis Date Noted   CKD stage 3a, GFR 45-59 ml/min (HCC) 08/06/2022   Abnormal urine 08/06/2022   Urinary tract infectious disease 08/05/2022   Exposure to COVID-19 virus 07/26/2022   COPD exacerbation (HCC) 04/22/2022   Acute respiratory failure with hypoxemia (HCC) 04/21/2022   RSV (respiratory syncytial virus pneumonia)  04/21/2022   Anxiety and depression 04/21/2022   Acute respiratory failure (HCC) 04/21/2022   Dysuria 01/12/2021   Vitamin D deficiency 07/15/2020   Low grade squamous intraepithelial lesion (LGSIL) on cervicovaginal cytologic smear 07/09/2020   Menopausal syndrome 07/09/2020   Stress incontinence of urine 07/09/2020   Muscle cramps 06/18/2020   Hypomagnesemia 06/11/2020   Physical deconditioning 06/03/2020   COVID-19 05/21/2020   CHF (congestive heart failure) (HCC) 05/21/2020   Suspected COVID-19 virus infection 05/09/2020   Complex care coordination 02/27/2019   Healthcare maintenance 02/09/2019   Medication management 12/01/2018   Shortness of breath 12/01/2018   Former smoker 12/01/2018   Atrial fibrillation, rapid (HCC) 04/29/2018   Acute gastroenteritis 03/23/2018   GERD (gastroesophageal reflux disease) 01/03/2018   Proteinuria 09/15/2017    Insomnia 03/17/2017   Rash 11/04/2016   Hematoma 11/04/2016   Pre-operative examination for internal medicine 09/11/2016   Back wound, unspecified laterality, initial encounter 05/26/2016   S/P lumbar laminectomy 03/19/2016   Overactive bladder 10/20/2015   Pulmonary HTN (HCC) 06/19/2015   Mild obstructive sleep apnea 06/19/2015   Snoring 06/19/2015   Abdominal pain 04/18/2015   Cough 03/08/2015   A-fib (HCC) 02/05/2014   Preop cardiovascular exam 02/05/2014   Arthritis of knee 02/05/2014   Arm pain, right 08/31/2013   Low back pain 09/26/2011   Leg pain 07/07/2011   Dizziness 07/07/2011   Menopausal disorder 07/07/2011   COPD with acute exacerbation (HCC) 02/25/2011   Hemorrhage of rectum and anus 11/04/2010   Internal thrombosed hemorrhoids 11/04/2010   Internal hemorrhoids with other complication 11/04/2010   External hemorrhoids 11/04/2010   Dyspepsia 09/24/2010   Peripheral edema 09/24/2010   Diabetes (HCC) 09/21/2010   Encounter for well adult exam with abnormal findings 09/21/2010   IBS 06/20/2009   HEMATOCHEZIA 06/20/2009   Elevated lipids 08/16/2008   Anxiety state 08/16/2008   Depression 08/16/2008   Allergic rhinitis 08/16/2008   Asthma 08/16/2008   COPD (chronic obstructive pulmonary disease) (HCC) 08/16/2008   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   GERD (gastroesophageal reflux disease)    H/O hiatal hernia    HEMATOCHEZIA 06/20/2009    Hemorrhoids    History of cardioversion 10/30/14   History of kidney stones    HYPERLIPIDEMIA 08/16/2008   HYPERTENSION 12/03/2006   Hypertension    Impaired glucose tolerance 09/21/2010   Overactive bladder 10/20/2015   Pneumonia    as a child   Pulmonary HTN (HCC) 06/19/2015   Shortness of breath dyspnea    Sleep apnea    MILD NO CPAP ORDERED   Snoring 06/19/2015    Family History  Problem Relation Age of Onset   Diabetes Mother    Hyperlipidemia Mother    Hypertension Mother    Colon polyps Mother    Stroke Father  Colon cancer Neg Hx     Past Surgical History:  Procedure Laterality Date   ABDOMINAL HYSTERECTOMY     APPENDECTOMY     CARDIOVERSION N/A 03/20/2014   Procedure: CARDIOVERSION;  Surgeon: Wendall Stade, MD;  Location: [facility] ENDOSCOPY;  Service: Cardiovascular;  Laterality: N/A;   CARDIOVERSION N/A 10/30/2014   Procedure: CARDIOVERSION;  Surgeon: Wendall Stade, MD;  Location: [facility] ENDOSCOPY;  Service: Cardiovascular;  Laterality: N/A;   CARDIOVERSION N/A 04/03/2019   Procedure: CARDIOVERSION;  Surgeon: Lars Masson, MD;  Location: [facility] ENDOSCOPY;  Service: Cardiovascular;  Laterality: N/A;   CARPAL TUNNEL RELEASE     Right + LEFT   CESAREAN SECTION     x 1    COLONOSCOPY  2004   CYST EXCISION     RT ARM    CYSTOCELE REPAIR N/A 10/25/2012   Procedure: ANTERIOR REPAIR (CYSTOCELE);  Surgeon: Miguel Aschoff, MD;  Location: [facility] ORS;  Service: Gynecology;  Laterality: N/A;   HEEL SPUR SURGERY Bilateral    KNEE SURGERY     Right + LEFT   LUMBAR LAMINECTOMY/DECOMPRESSION MICRODISCECTOMY N/A 03/19/2016   Procedure: Laminectomy and Foraminotomy - Lumbar three-four - Lumbar four-five;  Surgeon: Tia Alert, MD;  Location: [facility] OR;  Service: Neurosurgery;  Laterality: N/A;   RADIOLOGY WITH ANESTHESIA Right 11/01/2014   Procedure: MRI RIGHT FOREARM;  Surgeon: Medication Radiologist, MD;  Location: [facility] OR;  Service: Radiology;  Laterality: Right;   RADIOLOGY WITH ANESTHESIA Right  08/29/2015   Procedure: MRI - RIGHT FOREARM;  Surgeon: Medication Radiologist, MD;  Location: [facility] OR;  Service: Radiology;  Laterality: Right;   RADIOLOGY WITH ANESTHESIA N/A 12/05/2015   Procedure: MRI SPINE WITHOUT;  Surgeon: Medication Radiologist, MD;  Location: [facility] OR;  Service: Radiology;  Laterality: N/A;   SKIN CANCER EXCISION     BILAT SHOULDERS   SPLIT NIGHT STUDY  09/04/2015   TONSILLECTOMY AND ADENOIDECTOMY     Social History   Occupational History   Occupation: currently unemployed, former [employer] (medical)  Tobacco Use   Smoking status: Former    Current packs/day: 0.00    Average packs/day: 1.5 packs/day for 30.0 years (45.0 ttl pk-yrs)    Types: Cigarettes    Start date: 04/20/1973    Quit date: 04/21/2003    Years since quitting: 19.8   Smokeless tobacco: Never  Vaping Use   Vaping status: Never Used  Substance and Sexual Activity   Alcohol use: Yes    Alcohol/week: 2.0 standard drinks of alcohol    Types: 2 Standard drinks or equivalent per week    Comment: OCCAS   Drug use: No   Sexual activity: Not Currently    Birth control/protection: None

## 2023-03-08 ENCOUNTER — Other Ambulatory Visit: Payer: Self-pay | Admitting: Internal Medicine

## 2023-03-09 ENCOUNTER — Other Ambulatory Visit: Payer: Self-pay

## 2023-03-10 ENCOUNTER — Other Ambulatory Visit: Payer: Self-pay | Admitting: Internal Medicine

## 2023-03-10 ENCOUNTER — Telehealth: Payer: Self-pay | Admitting: Emergency Medicine

## 2023-03-10 NOTE — Telephone Encounter (Signed)
PT is inq about [facility] ppwk for the forth coming year. Pls call to advise or perhaps in the future front desk can go on line, print and mail the ppwk to PT. Let us know.

## 2023-03-11 DIAGNOSIS — E1122 Type 2 diabetes mellitus with diabetic chronic kidney disease: Secondary | ICD-10-CM | POA: Diagnosis not present

## 2023-03-11 DIAGNOSIS — I129 Hypertensive chronic kidney disease with stage 1 through stage 4 chronic kidney disease, or unspecified chronic kidney disease: Secondary | ICD-10-CM | POA: Diagnosis not present

## 2023-03-11 DIAGNOSIS — N1831 Chronic kidney disease, stage 3a: Secondary | ICD-10-CM | POA: Diagnosis not present

## 2023-03-11 DIAGNOSIS — R3129 Other microscopic hematuria: Secondary | ICD-10-CM | POA: Diagnosis not present

## 2023-03-11 DIAGNOSIS — D638 Anemia in other chronic diseases classified elsewhere: Secondary | ICD-10-CM | POA: Diagnosis not present

## 2023-03-13 LAB — LAB REPORT - SCANNED
Creatinine, POC: 158.5 mg/dL
EGFR: 65

## 2023-03-15 ENCOUNTER — Telehealth: Payer: Self-pay | Admitting: Internal Medicine

## 2023-03-15 NOTE — Telephone Encounter (Signed)
Please return call Copied from CRM [id number]. Topic: Clinical - Request for Lab/Test Order >> Mar 15, 2023  9:21 AM Conni Elliot wrote: Reason for CRM: Michelle Nasuti from [facility] was calling in checking on order of unit of blood for pt. Call back number is [phone] ex. [phone]

## 2023-03-16 NOTE — Telephone Encounter (Signed)
Spoke with patient and she needs to renew her PAP from last year for [facility]. Provided patient with ph nbr to [employer] so she can update her information with them. She will let us know if she needs anything further.

## 2023-03-22 ENCOUNTER — Encounter: Payer: Self-pay | Admitting: Orthopedic Surgery

## 2023-03-22 ENCOUNTER — Ambulatory Visit: Payer: Medicare Other | Admitting: Orthopedic Surgery

## 2023-03-22 DIAGNOSIS — S92424B Nondisplaced fracture of distal phalanx of right great toe, initial encounter for open fracture: Secondary | ICD-10-CM | POA: Diagnosis not present

## 2023-03-22 MED ORDER — DOXYCYCLINE HYCLATE 100 MG PO TABS
100.0000 mg | ORAL_TABLET | Freq: Two times a day (BID) | ORAL | 0 refills | Status: DC
Start: 2023-03-22 — End: 2023-09-15

## 2023-03-22 NOTE — Progress Notes (Signed)
Office Visit Note   Patient: Andrea Perry           Date of Birth: 19-Oct-1950           MRN: [id number] Visit Date: 03/22/2023              Requested by: Corwin Levins, MD [address] [place],  [place] [address] PCP: Corwin Levins, MD  Chief Complaint  Patient presents with   Right Foot - Wound Check      HPI: Patient is a 72 year old woman who presents in follow-up status post open fracture right great toe.  She is using Bactroban and has been on oral antibiotics for the past 4 weeks.  Assessment & Plan: Visit Diagnoses:  1. Open nondisplaced fracture of distal phalanx of right great toe, initial encounter     Plan: Will have her continue with the Bactroban and will provide a refill for doxycycline.  Follow-Up Instructions: Return in about 4 weeks (around 04/19/2023).   Ortho Exam  Patient is alert, oriented, no adenopathy, well-dressed, normal affect, normal respiratory effort. Examination patient has a palpable dorsalis pedis pulse.  The traumatic wound is completely healed there is no cellulitis no drainage.  There is granulation tissue over the nailbed.  No sausage digit swelling.  Most recent hemoglobin A1c 6.7.  Imaging: No results found. No images are attached to the encounter.  Labs: Lab Results  Component Value Date   HGBA1C 6.7 (H) 07/24/2022   HGBA1C 7.0 (H) 02/04/2022   HGBA1C 7.1 (H) 07/18/2021   CRP 1.6 (H) 05/25/2020   CRP 4.0 (H) 05/24/2020   CRP 7.9 (H) 05/23/2020     Lab Results  Component Value Date   ALBUMIN 3.9 07/24/2022   ALBUMIN 3.1 (L) 04/20/2022   ALBUMIN 3.6 02/04/2022    Lab Results  Component Value Date   MG 1.3 (L) 02/04/2022   MG 1.3 (L) 07/18/2021   MG 2.0 01/09/2021   Lab Results  Component Value Date   VD25OH 24.31 (L) 07/24/2022   VD25OH 25.33 (L) 02/04/2022   VD25OH 24.06 (L) 07/18/2021    No results found for: "PREALBUMIN"    Latest Ref Rng & Units 01/27/2023    8:26 PM 12/18/2022    1:45 AM 12/17/2022     5:27 PM  CBC EXTENDED  WBC 4.0 - 10.5 K/uL 6.8   7.9   RBC 3.87 - 5.11 MIL/uL 3.66   3.59   Hemoglobin 12.0 - 15.0 g/dL 7.7  8.9  8.2   HCT 19.1 - 46.0 % 28.0  30.5  27.8   Platelets 150 - 400 K/uL 414   374      There is no height or weight on file to calculate BMI.  Orders:  No orders of the defined types were placed in this encounter.  No orders of the defined types were placed in this encounter.    Procedures: No procedures performed  Clinical Data: No additional findings.  ROS:  All other systems negative, except as noted in the HPI. Review of Systems  Objective: Vital Signs: There were no vitals taken for this visit.  Specialty Comments:  No specialty comments available.  PMFS History: Patient Active Problem List   Diagnosis Date Noted   CKD stage 3a, GFR 45-59 ml/min (HCC) 08/06/2022   Abnormal urine 08/06/2022   Urinary tract infectious disease 08/05/2022   Exposure to COVID-19 virus 07/26/2022   COPD exacerbation (HCC) 04/22/2022   Acute respiratory failure  with hypoxemia (HCC) 04/21/2022   RSV (respiratory syncytial virus pneumonia) 04/21/2022   Anxiety and depression 04/21/2022   Acute respiratory failure (HCC) 04/21/2022   Dysuria 01/12/2021   Vitamin D deficiency 07/15/2020   Low grade squamous intraepithelial lesion (LGSIL) on cervicovaginal cytologic smear 07/09/2020   Menopausal syndrome 07/09/2020   Stress incontinence of urine 07/09/2020   Muscle cramps 06/18/2020   Hypomagnesemia 06/11/2020   Physical deconditioning 06/03/2020   COVID-19 05/21/2020   CHF (congestive heart failure) (HCC) 05/21/2020   Suspected COVID-19 virus infection 05/09/2020   Complex care coordination 02/27/2019   Healthcare maintenance 02/09/2019   Medication management 12/01/2018   Shortness of breath 12/01/2018   Former smoker 12/01/2018   Atrial fibrillation, rapid (HCC) 04/29/2018   Acute gastroenteritis 03/23/2018   GERD (gastroesophageal reflux  disease) 01/03/2018   Proteinuria 09/15/2017   Insomnia 03/17/2017   Rash 11/04/2016   Hematoma 11/04/2016   Pre-operative examination for internal medicine 09/11/2016   Back wound, unspecified laterality, initial encounter 05/26/2016   S/P lumbar laminectomy 03/19/2016   Overactive bladder 10/20/2015   Pulmonary HTN (HCC) 06/19/2015   Mild obstructive sleep apnea 06/19/2015   Snoring 06/19/2015   Abdominal pain 04/18/2015   Cough 03/08/2015   A-fib (HCC) 02/05/2014   Preop cardiovascular exam 02/05/2014   Arthritis of knee 02/05/2014   Arm pain, right 08/31/2013   Low back pain 09/26/2011   Leg pain 07/07/2011   Dizziness 07/07/2011   Menopausal disorder 07/07/2011   COPD with acute exacerbation (HCC) 02/25/2011   Hemorrhage of rectum and anus 11/04/2010   Internal thrombosed hemorrhoids 11/04/2010   Internal hemorrhoids with other complication 11/04/2010   External hemorrhoids 11/04/2010   Dyspepsia 09/24/2010   Peripheral edema 09/24/2010   Diabetes (HCC) 09/21/2010   Encounter for well adult exam with abnormal findings 09/21/2010   IBS 06/20/2009   HEMATOCHEZIA 06/20/2009   Elevated lipids 08/16/2008   Anxiety state 08/16/2008   Depression 08/16/2008   Allergic rhinitis 08/16/2008   Asthma 08/16/2008   COPD (chronic obstructive pulmonary disease) (HCC) 08/16/2008   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   GERD (gastroesophageal reflux  disease)    H/O hiatal hernia    HEMATOCHEZIA 06/20/2009   Hemorrhoids    History of cardioversion 10/30/14   History of kidney stones    HYPERLIPIDEMIA 08/16/2008   HYPERTENSION 12/03/2006   Hypertension    Impaired glucose tolerance 09/21/2010   Overactive bladder 10/20/2015   Pneumonia    as a child   Pulmonary HTN (HCC) 06/19/2015   Shortness of breath dyspnea    Sleep apnea    MILD NO CPAP ORDERED   Snoring 06/19/2015    Family History  Problem Relation Age of Onset   Diabetes Mother    Hyperlipidemia Mother    Hypertension Mother  Colon polyps Mother    Stroke Father    Colon cancer Neg Hx     Past Surgical History:  Procedure Laterality Date   ABDOMINAL HYSTERECTOMY     APPENDECTOMY     CARDIOVERSION N/A 03/20/2014   Procedure: CARDIOVERSION;  Surgeon: Wendall Stade, MD;  Location: [facility] ENDOSCOPY;  Service: Cardiovascular;  Laterality: N/A;   CARDIOVERSION N/A 10/30/2014   Procedure: CARDIOVERSION;  Surgeon: Wendall Stade, MD;  Location: [facility] ENDOSCOPY;  Service: Cardiovascular;  Laterality: N/A;   CARDIOVERSION N/A 04/03/2019   Procedure: CARDIOVERSION;  Surgeon: Lars Masson, MD;  Location: [facility] ENDOSCOPY;  Service: Cardiovascular;  Laterality: N/A;   CARPAL TUNNEL RELEASE     Right + LEFT   CESAREAN SECTION     x 1    COLONOSCOPY  2004   CYST EXCISION     RT ARM    CYSTOCELE REPAIR N/A 10/25/2012   Procedure: ANTERIOR REPAIR (CYSTOCELE);  Surgeon: Miguel Aschoff, MD;  Location: [facility] ORS;  Service: Gynecology;  Laterality: N/A;   HEEL SPUR SURGERY Bilateral    KNEE SURGERY     Right + LEFT   LUMBAR LAMINECTOMY/DECOMPRESSION MICRODISCECTOMY N/A 03/19/2016   Procedure: Laminectomy and Foraminotomy - Lumbar three-four - Lumbar four-five;  Surgeon: Tia Alert, MD;  Location: [facility] OR;  Service: Neurosurgery;  Laterality: N/A;   RADIOLOGY WITH ANESTHESIA Right 11/01/2014   Procedure: MRI RIGHT FOREARM;  Surgeon: Medication Radiologist, MD;  Location: [facility] OR;  Service: Radiology;   Laterality: Right;   RADIOLOGY WITH ANESTHESIA Right 08/29/2015   Procedure: MRI - RIGHT FOREARM;  Surgeon: Medication Radiologist, MD;  Location: [facility] OR;  Service: Radiology;  Laterality: Right;   RADIOLOGY WITH ANESTHESIA N/A 12/05/2015   Procedure: MRI SPINE WITHOUT;  Surgeon: Medication Radiologist, MD;  Location: [facility] OR;  Service: Radiology;  Laterality: N/A;   SKIN CANCER EXCISION     BILAT SHOULDERS   SPLIT NIGHT STUDY  09/04/2015   TONSILLECTOMY AND ADENOIDECTOMY     Social History   Occupational History   Occupation: currently unemployed, former [employer] (medical)  Tobacco Use   Smoking status: Former    Current packs/day: 0.00    Average packs/day: 1.5 packs/day for 30.0 years (45.0 ttl pk-yrs)    Types: Cigarettes    Start date: 04/20/1973    Quit date: 04/21/2003    Years since quitting: 19.9   Smokeless tobacco: Never  Vaping Use   Vaping status: Never Used  Substance and Sexual Activity   Alcohol use: Yes    Alcohol/week: 2.0 standard drinks of alcohol    Types: 2 Standard drinks or equivalent per week    Comment: OCCAS   Drug use: No   Sexual activity: Not Currently    Birth control/protection: None

## 2023-03-23 ENCOUNTER — Ambulatory Visit: Payer: Medicare Other | Admitting: Internal Medicine

## 2023-03-25 DIAGNOSIS — H52203 Unspecified astigmatism, bilateral: Secondary | ICD-10-CM | POA: Diagnosis not present

## 2023-03-29 ENCOUNTER — Other Ambulatory Visit: Payer: Self-pay

## 2023-03-29 ENCOUNTER — Other Ambulatory Visit: Payer: Self-pay | Admitting: Internal Medicine

## 2023-03-29 ENCOUNTER — Other Ambulatory Visit (HOSPITAL_COMMUNITY): Payer: Self-pay | Admitting: *Deleted

## 2023-03-29 DIAGNOSIS — D649 Anemia, unspecified: Secondary | ICD-10-CM

## 2023-03-30 ENCOUNTER — Ambulatory Visit (HOSPITAL_COMMUNITY)
Admission: RE | Admit: 2023-03-30 | Discharge: 2023-03-30 | Disposition: A | Discharge status: Home / Self Care | Payer: Medicare Other | Source: Ambulatory Visit | Attending: Nephrology | Admitting: Nephrology

## 2023-03-30 DIAGNOSIS — D649 Anemia, unspecified: Secondary | ICD-10-CM

## 2023-03-30 LAB — PREPARE RBC (CROSSMATCH)

## 2023-03-30 LAB — ABO/RH: ABO/RH(D): O NEG

## 2023-03-30 MED ORDER — SODIUM CHLORIDE 0.9% IV SOLUTION: Freq: Once | INTRAVENOUS | Status: DC

## 2023-03-30 NOTE — Progress Notes (Signed)
Blood bank informed staff that pt has antibodies in her blood that is taking longer to match.  Pt has rescheduled for Friday at 0900.

## 2023-04-01 ENCOUNTER — Ambulatory Visit: Payer: Medicare Other | Admitting: Internal Medicine

## 2023-04-01 ENCOUNTER — Other Ambulatory Visit: Payer: Self-pay | Admitting: Internal Medicine

## 2023-04-02 ENCOUNTER — Other Ambulatory Visit: Payer: Self-pay

## 2023-04-02 ENCOUNTER — Encounter (HOSPITAL_COMMUNITY)
Admission: RE | Admit: 2023-04-02 | Discharge: 2023-04-02 | Disposition: A | Discharge status: Home / Self Care | Payer: Medicare Other | Source: Ambulatory Visit | Attending: Nephrology | Admitting: Nephrology

## 2023-04-02 DIAGNOSIS — D509 Iron deficiency anemia, unspecified: Secondary | ICD-10-CM | POA: Insufficient documentation

## 2023-04-03 LAB — BPAM RBC
Blood Product Expiration Date: 202412222359
ISSUE DATE / TIME: 202412130947
Unit Type and Rh: 9500

## 2023-04-03 LAB — TYPE AND SCREEN
ABO/RH(D): O NEG
Antibody Screen: NEGATIVE
Unit division: 0

## 2023-04-12 ENCOUNTER — Encounter: Payer: Self-pay | Admitting: Internal Medicine

## 2023-04-12 ENCOUNTER — Ambulatory Visit (INDEPENDENT_AMBULATORY_CARE_PROVIDER_SITE_OTHER): Payer: Medicare Other | Admitting: Internal Medicine

## 2023-04-12 VITALS — BP 124/64 | HR 75 | Temp 98.3°F | Ht 62.0 in | Wt 261.0 lb

## 2023-04-12 DIAGNOSIS — Z7984 Long term (current) use of oral hypoglycemic drugs: Secondary | ICD-10-CM

## 2023-04-12 DIAGNOSIS — E1165 Type 2 diabetes mellitus with hyperglycemia: Secondary | ICD-10-CM

## 2023-04-12 DIAGNOSIS — I10 Essential (primary) hypertension: Secondary | ICD-10-CM | POA: Diagnosis not present

## 2023-04-12 DIAGNOSIS — R32 Unspecified urinary incontinence: Secondary | ICD-10-CM

## 2023-04-12 DIAGNOSIS — D649 Anemia, unspecified: Secondary | ICD-10-CM

## 2023-04-12 DIAGNOSIS — J438 Other emphysema: Secondary | ICD-10-CM

## 2023-04-12 DIAGNOSIS — Z23 Encounter for immunization: Secondary | ICD-10-CM | POA: Diagnosis not present

## 2023-04-12 LAB — IBC PANEL
Iron: 11 ug/dL — ABNORMAL LOW (ref 42–145)
Saturation Ratios: 2 % — ABNORMAL LOW (ref 20.0–50.0)
TIBC: 551.6 ug/dL — ABNORMAL HIGH (ref 250.0–450.0)
Transferrin: 394 mg/dL — ABNORMAL HIGH (ref 212.0–360.0)

## 2023-04-12 LAB — URINALYSIS, ROUTINE W REFLEX MICROSCOPIC
Leukocytes,Ua: NEGATIVE
Nitrite: NEGATIVE
Specific Gravity, Urine: 1.02 (ref 1.000–1.030)
Total Protein, Urine: 100 — AB
Urine Glucose: NEGATIVE
Urobilinogen, UA: 1 (ref 0.0–1.0)
pH: 8 (ref 5.0–8.0)

## 2023-04-12 LAB — CBC WITH DIFFERENTIAL/PLATELET
Basophils Absolute: 0.1 10*3/uL (ref 0.0–0.1)
Basophils Relative: 1 % (ref 0.0–3.0)
Eosinophils Absolute: 0.3 10*3/uL (ref 0.0–0.7)
Eosinophils Relative: 2.5 % (ref 0.0–5.0)
HCT: 30.8 % — ABNORMAL LOW (ref 36.0–46.0)
Hemoglobin: 8.8 g/dL — ABNORMAL LOW (ref 12.0–15.0)
Lymphocytes Relative: 18 % (ref 12.0–46.0)
Lymphs Abs: 2.1 10*3/uL (ref 0.7–4.0)
MCHC: 28.7 g/dL — ABNORMAL LOW (ref 30.0–36.0)
MCV: 72.2 fL — ABNORMAL LOW (ref 78.0–100.0)
Monocytes Absolute: 0.7 10*3/uL (ref 0.1–1.0)
Monocytes Relative: 6.2 % (ref 3.0–12.0)
Neutro Abs: 8.4 10*3/uL — ABNORMAL HIGH (ref 1.4–7.7)
Neutrophils Relative %: 72.3 % (ref 43.0–77.0)
Platelets: 451 10*3/uL — ABNORMAL HIGH (ref 150.0–400.0)
RBC: 4.27 Mil/uL (ref 3.87–5.11)
RDW: 24.7 % — ABNORMAL HIGH (ref 11.5–15.5)
WBC: 11.6 10*3/uL — ABNORMAL HIGH (ref 4.0–10.5)

## 2023-04-12 LAB — HEPATIC FUNCTION PANEL
ALT: 9 U/L (ref 0–35)
AST: 16 U/L (ref 0–37)
Albumin: 3.9 g/dL (ref 3.5–5.2)
Alkaline Phosphatase: 82 U/L (ref 39–117)
Bilirubin, Direct: 0.1 mg/dL (ref 0.0–0.3)
Total Bilirubin: 0.6 mg/dL (ref 0.2–1.2)
Total Protein: 7 g/dL (ref 6.0–8.3)

## 2023-04-12 LAB — BASIC METABOLIC PANEL
BUN: 19 mg/dL (ref 6–23)
CO2: 27 meq/L (ref 19–32)
Calcium: 9.1 mg/dL (ref 8.4–10.5)
Chloride: 105 meq/L (ref 96–112)
Creatinine, Ser: 0.96 mg/dL (ref 0.40–1.20)
GFR: 60.05 mL/min (ref >60.00)
Glucose, Bld: 129 mg/dL — ABNORMAL HIGH (ref 70–99)
Potassium: 4.8 meq/L (ref 3.5–5.1)
Sodium: 141 meq/L (ref 135–145)

## 2023-04-12 LAB — LIPID PANEL
Cholesterol: 119 mg/dL (ref 0–200)
HDL: 51.3 mg/dL (ref >39.00)
LDL Cholesterol: 45 mg/dL (ref 0–99)
NonHDL: 67.96
Total CHOL/HDL Ratio: 2
Triglycerides: 117 mg/dL (ref 0.0–149.0)
VLDL: 23.4 mg/dL (ref 0.0–40.0)

## 2023-04-12 LAB — MICROALBUMIN / CREATININE URINE RATIO
Creatinine,U: 179.4 mg/dL
Microalb Creat Ratio: 19.4 mg/g (ref 0.0–30.0)
Microalb, Ur: 34.8 mg/dL — ABNORMAL HIGH (ref 0.0–1.9)

## 2023-04-12 LAB — TSH: TSH: 3.89 u[IU]/mL (ref 0.35–5.50)

## 2023-04-12 LAB — FERRITIN: Ferritin: 17.7 ng/mL (ref 10.0–291.0)

## 2023-04-12 NOTE — Patient Instructions (Signed)
You had the flu shot today  You are given the script for the [facility] [place] system to take to [facility]  Please continue all other medications as before, and refills have been done if requested.  Please have the pharmacy call with any other refills you may need.  Please continue your efforts at being more active, low cholesterol diet, and weight control.  You are otherwise up to date with prevention measures today.  Please keep your appointments with your specialists as you may have planned  You will likely need Gastroenterology vs Hematology (or maybe both) depending on the Lab testing today  Please go to the LAB at the blood drawing area for the tests to be done  You will be contacted by phone if any changes need to be made immediately.  Otherwise, you will receive a letter about your results with an explanation, but please check with MyChart first.  Please make an Appointment to return in 3 months, or sooner if needed

## 2023-04-12 NOTE — Progress Notes (Unsigned)
Patient ID: Andrea Perry, female   DOB: 08/23/1950, 72 y.o.   MRN: [id number]        Chief Complaint: follow up urinary incontinence, anemiia, dm, htn       HPI:  Andrea Perry is a 72 y.o. female here with c/o 3 day onset urinary incontnence, but Denies urinary symptoms such as dysuria, frequency, urgency, flank pain, hematuria or n/v, fever, chills.  No overt bleeding bruising.  Pt denies chest pain, increased sob or doe, wheezing, orthopnea, PND, increased LE swelling, palpitations, dizziness or syncope.   Pt denies polydipsia, polyuria, or new focal neuro s/s.          Wt Readings from Last 3 Encounters:  04/12/23 261 lb (118.4 kg)  04/02/23 262 lb (118.8 kg)  03/30/23 260 lb (117.9 kg)   BP Readings from Last 3 Encounters:  04/12/23 124/64  04/02/23 117/68  03/30/23 134/75         Past Medical History:  Diagnosis Date   ALLERGIC RHINITIS 08/16/2008   ANXIETY 08/16/2008   Arthritis    hands, knees, lower back   ASTHMA 08/16/2008   Asthma    BRONCHITIS     DR. 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   GERD (gastroesophageal reflux disease)    H/O hiatal hernia    HEMATOCHEZIA 06/20/2009   Hemorrhoids    History of cardioversion 10/30/14   History of kidney stones    HYPERLIPIDEMIA 08/16/2008   HYPERTENSION 12/03/2006   Hypertension    Impaired glucose tolerance 09/21/2010   Overactive bladder 10/20/2015   Pneumonia    as a child   Pulmonary HTN (HCC) 06/19/2015   Shortness of breath dyspnea    Sleep apnea    MILD NO CPAP ORDERED   Snoring 06/19/2015   Past Surgical History:  Procedure Laterality Date   ABDOMINAL HYSTERECTOMY     APPENDECTOMY     CARDIOVERSION N/A 03/20/2014   Procedure: CARDIOVERSION;  Surgeon: Wendall Stade,  MD;  Location[facility] ENDOSCOPY;  Service: Cardiovascular;  Laterality: N/A;   CARDIOVERSION N/A 10/30/2014   Procedure: CARDIOVERSION;  Surgeon: Wendall Stade, MD;  Location: [facility] ENDOSCOPY;  Service: Cardiovascular;  Laterality: N/A;   CARDIOVERSION N/A 04/03/2019   Procedure: CARDIOVERSION;  Surgeon: Lars Masson, MD;  Location: [facility] ENDOSCOPY;  Service: Cardiovascular;  Laterality: N/A;   CARPAL TUNNEL RELEASE     Right + LEFT   CESAREAN SECTION     x 1    COLONOSCOPY  2004   CYST EXCISION     RT ARM    CYSTOCELE REPAIR N/A 10/25/2012   Procedure: ANTERIOR REPAIR (CYSTOCELE);  Surgeon: Miguel Aschoff, MD;  Location: [facility] ORS;  Service: Gynecology;  Laterality: N/A;   HEEL SPUR SURGERY Bilateral    KNEE SURGERY     Right + LEFT   LUMBAR LAMINECTOMY/DECOMPRESSION MICRODISCECTOMY N/A 03/19/2016   Procedure: Laminectomy and Foraminotomy - Lumbar three-four - Lumbar four-five;  Surgeon: Tia Alert, MD;  Location: [facility] OR;  Service: Neurosurgery;  Laterality: N/A;   RADIOLOGY WITH ANESTHESIA Right 11/01/2014   Procedure: MRI RIGHT FOREARM;  Surgeon: Medication Radiologist,  MD;  Location: [facility] OR;  Service: Radiology;  Laterality: Right;   RADIOLOGY WITH ANESTHESIA Right 08/29/2015   Procedure: MRI - RIGHT FOREARM;  Surgeon: Medication Radiologist, MD;  Location: [facility] OR;  Service: Radiology;  Laterality: Right;   RADIOLOGY WITH ANESTHESIA N/A 12/05/2015   Procedure: MRI SPINE WITHOUT;  Surgeon: Medication Radiologist, MD;  Location: [facility] OR;  Service: Radiology;  Laterality: N/A;   SKIN CANCER EXCISION     BILAT SHOULDERS   SPLIT NIGHT STUDY  09/04/2015   TONSILLECTOMY AND ADENOIDECTOMY      reports that she quit smoking about 19 years ago. Her smoking use included cigarettes. She started smoking about 50 years ago. She has a 45 pack-year smoking history. She has never used smokeless tobacco. She reports current alcohol use of about 2.0 standard drinks of alcohol per week. She reports that she does not use  drugs. family history includes Colon polyps in her mother; Diabetes in her mother; Hyperlipidemia in her mother; Hypertension in her mother; Stroke in her father. Allergies  Allergen Reactions   Tramadol Hcl Nausea And Vomiting and Other (See Comments)     mouth dryness, headache   Other Nausea Only   Codeine Nausea And Vomiting and Nausea Only   Tape Rash    Plastic tape, bandaids and ekg leads, causes redness and rash Other reaction(s): Not available   Vicodin [Hydrocodone-Acetaminophen] Nausea And Vomiting   Current Outpatient Medications on File Prior to Visit  Medication Sig Dispense Refill   acetaminophen (TYLENOL) 500 MG tablet Take 500 mg by mouth 2 (two) times daily.     albuterol (PROVENTIL) (2.5 MG/3ML) 0.083% nebulizer solution USE EVERY 4 HOURS WITH IPRATROPIUM 300 mL 12   amLODipine (NORVASC) 10 MG tablet Take 1 tablet by mouth once daily 90 tablet 3   amoxicillin-clavulanate (AUGMENTIN) 875-125 MG tablet Take 1 tablet by mouth every 12 (twelve) hours. 14 tablet 0   apixaban (ELIQUIS) 5 MG TABS tablet Take 1 tablet by mouth twice daily 60 tablet 5   Blood Glucose Monitoring Suppl (FREESTYLE LITE) DEVI Use as directed daily E11.9 1 each 0   Budeson-Glycopyrrol-Formoterol (BREZTRI AEROSPHERE) 160-9-4.8 MCG/ACT AERO Inhale 2 puffs into the lungs 2 (two) times daily. 10.7 g 11   cetirizine (ZYRTEC) 10 MG tablet Take 10 mg by mouth daily.     Cholecalciferol 50 MCG (2000 UT) TABS 1 tab by mouth once daily 30 tablet 99   cyclobenzaprine (FLEXERIL) 5 MG tablet      dimenhyDRINATE (DRAMAMINE) 50 MG tablet Take by mouth every 8 (eight) hours as needed.     doxycycline (VIBRA-TABS) 100 MG tablet Take 1 tablet (100 mg total) by mouth 2 (two) times daily. 14 tablet 0   doxycycline (VIBRA-TABS) 100 MG tablet Take 1 tablet (100 mg total) by mouth 2 (two) times daily. 30 tablet 0   doxycycline (VIBRA-TABS) 100 MG tablet Take 1 tablet (100 mg total) by mouth 2 (two) times daily. 60 tablet 0    escitalopram (LEXAPRO) 20 MG tablet Take 1 tablet (20 mg total) by mouth daily. (Patient taking differently: Take 20 mg by mouth at bedtime.) 90 tablet 3   estradiol (ESTRACE) 1 MG tablet Take 1 mg by mouth daily.     ferrous sulfate 325 (65 FE) MG tablet Take 1 tablet (325 mg total) by mouth daily. 30 tablet 0   fluorouracil (EFUDEX) 5 % cream Apply topically.     furosemide (LASIX) 40 MG tablet Take 1 tablet (40 mg total)  by mouth daily. 90 tablet 3   glucose blood (FREESTYLE LITE) test strip Use as instructed once daily E11.9 100 each 12   GUAIFENESIN ER PO Take 600 mg by mouth daily as needed for cough.     ipratropium-albuterol (DUONEB) 0.5-2.5 (3) MG/3ML SOLN Take 3 mLs by nebulization every 6 (six) hours as needed. 360 mL 0   irbesartan (AVAPRO) 150 MG tablet Take 1 tablet by mouth once daily 90 tablet 3   ketoconazole (NIZORAL) 2 % cream      Lancets MISC Use as directed once daily E11.9 100 each 11   levothyroxine (SYNTHROID) 50 MCG tablet Take 1 tablet (50 mcg total) by mouth daily. 90 tablet 3   LORazepam (ATIVAN) 1 MG tablet Take 1 tablet by mouth twice daily as needed for anxiety 60 tablet 2   Magnesium Oxide -Mg Supplement 500 MG CAPS 1 tab by mouth twice per day (Patient taking differently: Take 1 capsule by mouth in the morning. 1 tab by mouth twice per day) 60 capsule 11   metFORMIN (GLUCOPHAGE-XR) 500 MG 24 hr tablet TAKE 2 TABLETS BY MOUTH ONCE DAILY WITH BREAKFAST 180 tablet 3   metoprolol tartrate (LOPRESSOR) 100 MG tablet Take 1 tablet (100 mg total) by mouth 2 (two) times daily. 180 tablet 2   mupirocin ointment (BACTROBAN) 2 % Apply 1 Application topically 2 (two) times daily. Apply to the affected area 2 times a day 22 g 3   pantoprazole (PROTONIX) 40 MG tablet Take 1 tablet by mouth once daily 90 tablet 3   potassium chloride SA (KLOR-CON) 20 MEQ tablet Take 1 tablet (20 mEq total) by mouth daily. 90 tablet 3   promethazine-dextromethorphan (PROMETHAZINE-DM) 6.25-15  MG/5ML syrup      rosuvastatin (CRESTOR) 40 MG tablet Take 1 tablet by mouth once daily 90 tablet 3   TETRAHYDROZOLINE HCL OP Apply to eye.     tiZANidine (ZANAFLEX) 4 MG tablet Take 1 tablet (4 mg total) by mouth every 6 (six) hours as needed for muscle spasms. 60 tablet 2   tolterodine (DETROL LA) 4 MG 24 hr capsule Take 1 capsule (4 mg total) by mouth daily. 90 capsule 3   vitamin E 180 MG (400 UNITS) capsule Take by mouth.     No current facility-administered medications on file prior to visit.        ROS:  All others reviewed and negative.  Objective        PE:  BP 124/64 (BP Location: Left Arm, Patient Position: Sitting, Cuff Size: Normal)   Pulse 75   Temp 98.3 F (36.8 C) (Oral)   Ht 5\' 2"  (1.575 m)   Wt 261 lb (118.4 kg)   SpO2 99%   BMI 47.74 kg/m                 Constitutional: Pt appears in NAD               HENT: Head: NCAT.                Right Ear: External ear normal.                 Left Ear: External ear normal.                Eyes: . Pupils are equal, round, and reactive to light. Conjunctivae and EOM are normal               Nose: without d/c or deformity  Neck: Neck supple. Gross normal ROM               Cardiovascular: Normal rate and regular rhythm.                 Pulmonary/Chest: Effort normal and breath sounds without rales or wheezing.                Abd:  Soft, NT, ND, + BS, no organomegaly               Neurological: Pt is alert. At baseline orientation, motor grossly intact               Skin: Skin is warm. No rashes, no other new lesions, LE edema - none               Psychiatric: Pt behavior is normal without agitation   Micro: none  Cardiac tracings I have personally interpreted today:  none  Pertinent Radiological findings (summarize): none   Lab Results  Component Value Date   WBC 11.6 (H) 04/12/2023   HGB 8.8 Repeated and verified X2. (L) 04/12/2023   HCT 30.8 (L) 04/12/2023   PLT 451.0 (H) 04/12/2023   GLUCOSE 129 (H)  04/12/2023   CHOL 119 04/12/2023   TRIG 117.0 04/12/2023   HDL 51.30 04/12/2023   LDLDIRECT 111.0 07/09/2020   LDLCALC 45 04/12/2023   ALT 9 04/12/2023   AST 16 04/12/2023   NA 141 04/12/2023   K 4.8 04/12/2023   CL 105 04/12/2023   CREATININE 0.96 04/12/2023   BUN 19 04/12/2023   CO2 27 04/12/2023   TSH 3.89 04/12/2023   INR 1.2 04/20/2022   HGBA1C 5.9 04/12/2023   MICROALBUR 34.8 (H) 04/12/2023   Assessment/Plan:  Andrea Perry is a 72 y.o. White or Caucasian [1] female with  has a past medical history of ALLERGIC RHINITIS (08/16/2008), ANXIETY (08/16/2008), Arthritis, ASTHMA (08/16/2008), Asthma, Bladder leak, Cancer (HCC), Chronic lower back pain, COPD (08/16/2008), DEPRESSION (08/16/2008), Diabetes mellitus without complication (HCC), Dysrhythmia, Excessive daytime sleepiness (06/19/2015), GENITAL HERPES (12/03/2006), GERD (gastroesophageal reflux disease), H/O hiatal hernia, HEMATOCHEZIA (06/20/2009), Hemorrhoids, History of cardioversion (10/30/14), History of kidney stones, HYPERLIPIDEMIA (08/16/2008), HYPERTENSION (12/03/2006), Hypertension, Impaired glucose tolerance (09/21/2010), Overactive bladder (10/20/2015), Pneumonia, Pulmonary HTN (HCC) (06/19/2015), Shortness of breath dyspnea, Sleep apnea, and Snoring (06/19/2015).  Diabetes (HCC) Lab Results  Component Value Date   HGBA1C 5.9 04/12/2023   Stable, pt to continue current medical treatment metfomrin ER 500 gm - 2 every day,    Urinary incontinence Etiology unclear, for ua and culture  Anemia Lab Results  Component Value Date   WBC 11.6 (H) 04/12/2023   HGB 8.8 Repeated and verified X2. (L) 04/12/2023   HCT 30.8 (L) 04/12/2023   MCV 72.2 (L) 04/12/2023   PLT 451.0 (H) 04/12/2023  Can't r/o worsening iron deficiency - for iron lab, and consider GI referral vs heme, consider iron infusion  COPD (chronic obstructive pulmonary disease) (HCC) Stable, continue inhaler prn  Essential hypertension BP Readings from Last 3 Encounters:   04/12/23 124/64  04/02/23 117/68  03/30/23 134/75   Stable, pt to continue medical treatment norvasc 10 every day, avapro 150 every day, lopressor 100 bid  Followup: Return in about 3 months (around 07/11/2023).  Oliver Barre, MD 04/15/2023 8:17 PM [facility]

## 2023-04-13 ENCOUNTER — Other Ambulatory Visit: Payer: Self-pay | Admitting: Internal Medicine

## 2023-04-13 DIAGNOSIS — D509 Iron deficiency anemia, unspecified: Secondary | ICD-10-CM | POA: Insufficient documentation

## 2023-04-13 DIAGNOSIS — R3129 Other microscopic hematuria: Secondary | ICD-10-CM

## 2023-04-13 LAB — URINE CULTURE

## 2023-04-13 LAB — HEMOGLOBIN A1C: Hgb A1c MFr Bld: 5.9 % (ref 4.6–6.5)

## 2023-04-15 ENCOUNTER — Encounter: Payer: Self-pay | Admitting: Internal Medicine

## 2023-04-15 ENCOUNTER — Telehealth: Payer: Self-pay

## 2023-04-15 ENCOUNTER — Telehealth: Payer: Self-pay | Admitting: Radiology

## 2023-04-15 DIAGNOSIS — R32 Unspecified urinary incontinence: Secondary | ICD-10-CM | POA: Insufficient documentation

## 2023-04-15 DIAGNOSIS — D649 Anemia, unspecified: Secondary | ICD-10-CM | POA: Insufficient documentation

## 2023-04-15 NOTE — Telephone Encounter (Signed)
Copied from CRM [id number]. Topic: Clinical - Lab/Test Results >> Apr 13, 2023  2:20 PM Deaijah H wrote: Reason for CRM: Patient called in regarding missed call to go over labs / please call [phone]

## 2023-04-15 NOTE — Assessment & Plan Note (Signed)
Lab Results  Component Value Date   HGBA1C 5.9 04/12/2023   Stable, pt to continue current medical treatment metfomrin ER 500 gm - 2 every day,

## 2023-04-15 NOTE — Telephone Encounter (Signed)
Dr. Jonny Ruiz, patient will be scheduled as soon as possible.  Auth Submission: NO AUTH NEEDED Site of care: Site of care: [facility] Payer: [employer] Medication & CPT/J Code(s) submitted: Feraheme (ferumoxytol) [id number] Route of submission (phone, fax, portal):  Phone # Fax # Auth type: Buy/Bill PB Units/visits requested: 510mg  x 2 doses Reference number:  Approval from: 04/15/23 to 04/20/23

## 2023-04-15 NOTE — Assessment & Plan Note (Signed)
Etiology unclear, for ua and culture

## 2023-04-15 NOTE — Assessment & Plan Note (Signed)
Lab Results  Component Value Date   WBC 11.6 (H) 04/12/2023   HGB 8.8 Repeated and verified X2. (L) 04/12/2023   HCT 30.8 (L) 04/12/2023   MCV 72.2 (L) 04/12/2023   PLT 451.0 (H) 04/12/2023  Can't r/o worsening iron deficiency - for iron lab, and consider GI referral vs heme, consider iron infusion

## 2023-04-15 NOTE — Telephone Encounter (Signed)
Patient has been made aware of her Lab results and Dr. Raphael Gibney comments. She gave a verbal understanding.

## 2023-04-15 NOTE — Assessment & Plan Note (Signed)
BP Readings from Last 3 Encounters:  04/12/23 124/64  04/02/23 117/68  03/30/23 134/75   Stable, pt to continue medical treatment norvasc 10 every day, avapro 150 every day, lopressor 100 bid

## 2023-04-15 NOTE — Assessment & Plan Note (Signed)
Stable, continue inhaler prn 

## 2023-04-19 ENCOUNTER — Ambulatory Visit: Payer: Medicare Other | Admitting: Orthopedic Surgery

## 2023-04-19 DIAGNOSIS — S92424B Nondisplaced fracture of distal phalanx of right great toe, initial encounter for open fracture: Secondary | ICD-10-CM

## 2023-04-20 ENCOUNTER — Encounter: Payer: Self-pay | Admitting: Orthopedic Surgery

## 2023-04-26 MED ORDER — DIPHENHYDRAMINE HCL 25 MG PO CAPS: 25.0000 mg | ORAL_CAPSULE | Freq: Once | ORAL | Status: DC

## 2023-04-26 MED ORDER — SODIUM CHLORIDE 0.9 % IV SOLN
510.0000 mg | Freq: Once | INTRAVENOUS | Status: DC
  Filled 2023-04-26: qty 17

## 2023-04-26 MED ORDER — ACETAMINOPHEN 325 MG PO TABS: 650.0000 mg | ORAL_TABLET | Freq: Once | ORAL | Status: DC

## 2023-04-30 ENCOUNTER — Ambulatory Visit: Payer: Medicare Other

## 2023-04-30 MED ORDER — ACETAMINOPHEN 325 MG PO TABS: 650.0000 mg | ORAL_TABLET | Freq: Once | ORAL | Status: DC

## 2023-04-30 MED ORDER — DIPHENHYDRAMINE HCL 25 MG PO CAPS
25.0000 mg | ORAL_CAPSULE | Freq: Once | ORAL | Status: DC
Start: 2023-04-30 — End: 2023-05-03

## 2023-05-03 ENCOUNTER — Encounter: Payer: Self-pay | Admitting: Internal Medicine

## 2023-05-10 ENCOUNTER — Ambulatory Visit: Payer: Medicare Other

## 2023-05-10 VITALS — BP 127/67 | HR 77 | Temp 97.6°F | Resp 20 | Ht 62.0 in | Wt 268.2 lb

## 2023-05-10 DIAGNOSIS — D509 Iron deficiency anemia, unspecified: Secondary | ICD-10-CM

## 2023-05-10 MED ORDER — DIPHENHYDRAMINE HCL 25 MG PO CAPS
25.0000 mg | ORAL_CAPSULE | Freq: Once | ORAL | Status: AC
  Administered 2023-05-10: 25 mg via ORAL
  Filled 2023-05-10: qty 1

## 2023-05-10 MED ORDER — ACETAMINOPHEN 325 MG PO TABS
650.0000 mg | ORAL_TABLET | Freq: Once | ORAL | Status: DC
Start: 2023-05-10 — End: 2023-05-10

## 2023-05-10 MED ORDER — SODIUM CHLORIDE 0.9 % IV SOLN
510.0000 mg | Freq: Once | INTRAVENOUS | Status: AC
  Administered 2023-05-10: 510 mg via INTRAVENOUS
  Filled 2023-05-10: qty 17

## 2023-05-10 NOTE — Progress Notes (Addendum)
Diagnosis: Iron Deficiency Anemia  Provider:  Chilton Greathouse MD  Procedure: IV Infusion  IV Type: Peripheral, IV Location: L Forearm   Feraheme (Ferumoxytol), Dose: 510 mg  Infusion Start Time: 1227  Infusion Stop Time: 1243  Post Infusion IV Care: Observation period completed and Peripheral IV Discontinued  Discharge: Condition: Good, Destination: Home . AVS Provided  Performed by:  Garnette Czech, RN

## 2023-05-11 ENCOUNTER — Ambulatory Visit: Payer: Medicare Other

## 2023-05-17 ENCOUNTER — Ambulatory Visit: Payer: Medicare Other

## 2023-05-17 VITALS — BP 110/72 | HR 71 | Temp 98.2°F | Resp 18 | Ht 62.0 in | Wt 264.2 lb

## 2023-05-17 DIAGNOSIS — D509 Iron deficiency anemia, unspecified: Secondary | ICD-10-CM

## 2023-05-17 MED ORDER — SODIUM CHLORIDE 0.9 % IV SOLN
510.0000 mg | Freq: Once | INTRAVENOUS | Status: AC
  Administered 2023-05-17: 510 mg via INTRAVENOUS
  Filled 2023-05-17: qty 17

## 2023-05-17 MED ORDER — DIPHENHYDRAMINE HCL 25 MG PO CAPS: 25.0000 mg | ORAL_CAPSULE | Freq: Once | ORAL | Status: DC

## 2023-05-17 MED ORDER — ACETAMINOPHEN 325 MG PO TABS: 650.0000 mg | ORAL_TABLET | Freq: Once | ORAL | Status: DC

## 2023-05-17 NOTE — Progress Notes (Signed)
Diagnosis: Iron Deficiency Anemia  Provider:  Chilton Greathouse MD  Procedure: IV Infusion  IV Type: Peripheral, IV Location: L Forearm  Feraheme (Ferumoxytol), Dose: 510 mg  Infusion Start Time: 1540  Infusion Stop Time: 1602 Patient stated that they already took their premedications at home at least 30 minutes prior to their infusion appointment.  Post Infusion IV Care: Patient declined observation and Peripheral IV Discontinued  Discharge: Condition: Good, Destination: Home . AVS Declined  Performed by:  Garnette Czech, RN

## 2023-05-19 ENCOUNTER — Telehealth: Payer: Self-pay | Admitting: Internal Medicine

## 2023-05-19 NOTE — Telephone Encounter (Signed)
Pt has been called for Andrea Perry duty but does not feel like she can attend due to her medical history and is requesting we writ her an excuse to be absent. PW has been placed in the providers box and needs to be finished by 3.5.25.  Please call pt when documentation is ready for pick up.  [phone]

## 2023-05-20 ENCOUNTER — Ambulatory Visit: Payer: Medicare Other | Admitting: Emergency Medicine

## 2023-05-21 NOTE — Telephone Encounter (Signed)
Ok done to pt per [employer]

## 2023-05-21 NOTE — Telephone Encounter (Signed)
Called and let know she states she doesn't have MyChart , she will come by the office to pick up the letter. Letter has been placed up front.

## 2023-05-21 NOTE — Telephone Encounter (Signed)
I have the letter that was printed off, what happened to the additional pw that the pt had brought in? Was it not needed?

## 2023-05-24 ENCOUNTER — Other Ambulatory Visit: Payer: Self-pay | Admitting: Emergency Medicine

## 2023-05-27 DIAGNOSIS — N3946 Mixed incontinence: Secondary | ICD-10-CM | POA: Diagnosis not present

## 2023-05-27 DIAGNOSIS — N3941 Urge incontinence: Secondary | ICD-10-CM | POA: Diagnosis not present

## 2023-05-27 DIAGNOSIS — R3121 Asymptomatic microscopic hematuria: Secondary | ICD-10-CM | POA: Diagnosis not present

## 2023-05-31 ENCOUNTER — Other Ambulatory Visit (INDEPENDENT_AMBULATORY_CARE_PROVIDER_SITE_OTHER): Payer: Medicare Other

## 2023-05-31 ENCOUNTER — Encounter: Payer: Self-pay | Admitting: Internal Medicine

## 2023-05-31 DIAGNOSIS — D509 Iron deficiency anemia, unspecified: Secondary | ICD-10-CM

## 2023-05-31 LAB — BASIC METABOLIC PANEL
BUN: 14 mg/dL (ref 6–23)
CO2: 26 meq/L (ref 19–32)
Calcium: 9.1 mg/dL (ref 8.4–10.5)
Chloride: 103 meq/L (ref 96–112)
Creatinine, Ser: 0.82 mg/dL (ref 0.40–1.20)
GFR: 72.48 mL/min (ref >60.00)
Glucose, Bld: 135 mg/dL — ABNORMAL HIGH (ref 70–99)
Potassium: 4.5 meq/L (ref 3.5–5.1)
Sodium: 142 meq/L (ref 135–145)

## 2023-05-31 LAB — CBC WITH DIFFERENTIAL/PLATELET
Basophils Absolute: 0.1 10*3/uL (ref 0.0–0.1)
Basophils Relative: 1 % (ref 0.0–3.0)
Eosinophils Absolute: 0.1 10*3/uL (ref 0.0–0.7)
Eosinophils Relative: 1.3 % (ref 0.0–5.0)
HCT: 38.6 % (ref 36.0–46.0)
Hemoglobin: 12 g/dL (ref 12.0–15.0)
Lymphocytes Relative: 12 % (ref 12.0–46.0)
Lymphs Abs: 0.9 10*3/uL (ref 0.7–4.0)
MCHC: 31.1 g/dL (ref 30.0–36.0)
MCV: 81.3 fL (ref 78.0–100.0)
Monocytes Absolute: 0.5 10*3/uL (ref 0.1–1.0)
Monocytes Relative: 6.6 % (ref 3.0–12.0)
Neutro Abs: 6.2 10*3/uL (ref 1.4–7.7)
Neutrophils Relative %: 79.1 % — ABNORMAL HIGH (ref 43.0–77.0)
Platelets: 193 10*3/uL (ref 150.0–400.0)
RBC: 4.76 Mil/uL (ref 3.87–5.11)
RDW: 31.2 % — ABNORMAL HIGH (ref 11.5–15.5)
WBC: 7.8 10*3/uL (ref 4.0–10.5)

## 2023-05-31 LAB — IBC PANEL
Iron: 46 ug/dL (ref 42–145)
Saturation Ratios: 11.5 % — ABNORMAL LOW (ref 20.0–50.0)
TIBC: 400.4 ug/dL (ref 250.0–450.0)
Transferrin: 286 mg/dL (ref 212.0–360.0)

## 2023-05-31 LAB — HEPATIC FUNCTION PANEL
ALT: 10 U/L (ref 0–35)
AST: 12 U/L (ref 0–37)
Albumin: 3.9 g/dL (ref 3.5–5.2)
Alkaline Phosphatase: 100 U/L (ref 39–117)
Bilirubin, Direct: 0.2 mg/dL (ref 0.0–0.3)
Total Bilirubin: 0.8 mg/dL (ref 0.2–1.2)
Total Protein: 6.9 g/dL (ref 6.0–8.3)

## 2023-05-31 LAB — FERRITIN: Ferritin: 149.4 ng/mL (ref 10.0–291.0)

## 2023-05-31 NOTE — Progress Notes (Signed)
 The test results show that your current treatment is OK, as the tests are stable.  Please continue the same plan.  There is no other need for change of treatment or further evaluation based on these results, at this time.  thanks

## 2023-06-07 ENCOUNTER — Other Ambulatory Visit: Payer: Self-pay | Admitting: Internal Medicine

## 2023-06-07 ENCOUNTER — Other Ambulatory Visit: Payer: Self-pay

## 2023-06-15 NOTE — Progress Notes (Deleted)
 Cardiology Office Note:    Date:  06/15/2023   ID:  Andrea Perry, DOB Jul 26, 1950, MRN [id number]  PCP:  Corwin Levins, MD   [facility] Providers Cardiologist:  Charlton Haws, MD     Referring MD: Corwin Levins, MD   Chief Complaint: follow-up a fib  History of Present Illness:    Andrea Perry is a pleasant 73 y.o. female with a hx of symptomatic persistent atrial fibrillation, hypertension, hyperlipidemia, diastolic dysfunction, diabetes, GERD, COPD, asthma, and obesity.  Myoview 2016 was normal. Echo 05/22/2020 with EF 50 to 55% LA 37 mm done during COVID infection.  Failed DCCV x 2 as well as flecainide. Did not want to pursue hospitalization for Tikosyn or ablation. Has been rate controlled and on Eliquis for OAC.  Is Dr. Solon Augusta with pulmonology for COPD/asthma with some peripheral eosinophilia.  Had a negative sleep study.  Today, she is here alone for follow-up. Hospital admission for COPD exacerbation 04/2022 .  Belching with GERD on Protonix. Using duoneb 2-3x/day Had doxy and prednisone taper June 2024 for asthmatic bronchitis sees [facility]  Seeing Dr Lajoyce Corners for open fracture of right great toe Rx doxycyline and Bactroban dressing changes  ***   Past Medical History:  Diagnosis Date   ALLERGIC RHINITIS 08/16/2008   ANXIETY 08/16/2008   Arthritis    hands, knees, lower back   ASTHMA 08/16/2008   Asthma    BRONCHITIS     DR. 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   GERD (gastroesophageal reflux disease)    H/O hiatal hernia    HEMATOCHEZIA 06/20/2009   Hemorrhoids    History of cardioversion 10/30/14   History of kidney stones    HYPERLIPIDEMIA 08/16/2008   HYPERTENSION 12/03/2006   Hypertension    Impaired glucose  tolerance 09/21/2010   Overactive bladder 10/20/2015   Pneumonia    as a child   Pulmonary HTN (HCC) 06/19/2015   Shortness of breath dyspnea    Sleep apnea    MILD NO CPAP ORDERED   Snoring 06/19/2015    Past Surgical History:  Procedure Laterality Date   ABDOMINAL HYSTERECTOMY     APPENDECTOMY     CARDIOVERSION N/A 03/20/2014   Procedure: CARDIOVERSION;  Surgeon: Wendall Stade, MD;  Location: [facility] ENDOSCOPY;  Service: Cardiovascular;  Laterality: N/A;   CARDIOVERSION N/A 10/30/2014   Procedure: CARDIOVERSION;  Surgeon: Wendall Stade, MD;  Location: [facility] ENDOSCOPY;  Service: Cardiovascular;  Laterality: N/A;   CARDIOVERSION N/A 04/03/2019   Procedure: CARDIOVERSION;  Surgeon: Lars Masson, MD;  Location: [facility] ENDOSCOPY;  Service: Cardiovascular;  Laterality: N/A;   CARPAL TUNNEL RELEASE     Right + LEFT   CESAREAN SECTION     x 1    COLONOSCOPY  2004   CYST EXCISION     RT ARM    CYSTOCELE REPAIR N/A 10/25/2012   Procedure: ANTERIOR REPAIR (CYSTOCELE);  Surgeon: Miguel Aschoff, MD;  Location: [facility] ORS;  Service: Gynecology;  Laterality: N/A;   HEEL SPUR SURGERY Bilateral    KNEE SURGERY     Right + LEFT   LUMBAR LAMINECTOMY/DECOMPRESSION  MICRODISCECTOMY N/A 03/19/2016   Procedure: Laminectomy and Foraminotomy - Lumbar three-four - Lumbar four-five;  Surgeon: Tia Alert, MD;  Location: [facility] OR;  Service: Neurosurgery;  Laterality: N/A;   RADIOLOGY WITH ANESTHESIA Right 11/01/2014   Procedure: MRI RIGHT FOREARM;  Surgeon: Medication Radiologist, MD;  Location: [facility] OR;  Service: Radiology;  Laterality: Right;   RADIOLOGY WITH ANESTHESIA Right 08/29/2015   Procedure: MRI - RIGHT FOREARM;  Surgeon: Medication Radiologist, MD;  Location: [facility] OR;  Service: Radiology;  Laterality: Right;   RADIOLOGY WITH ANESTHESIA N/A 12/05/2015   Procedure: MRI SPINE WITHOUT;  Surgeon: Medication Radiologist, MD;  Location: [facility] OR;  Service: Radiology;  Laterality: N/A;   SKIN CANCER EXCISION     BILAT SHOULDERS    SPLIT NIGHT STUDY  09/04/2015   TONSILLECTOMY AND ADENOIDECTOMY      Current Medications: No outpatient medications have been marked as taking for the 06/23/23 encounter (Appointment) with Wendall Stade, MD.     Allergies:   Tramadol hcl, Other, Codeine, Tape, and Vicodin [hydrocodone-acetaminophen]   Social History   Socioeconomic History   Marital status: Widowed    Spouse name: Not on file   Number of children: 1   Years of education: Not on file   Highest education level: Not on file  Occupational History   Occupation: currently unemployed, former [employer] (medical)  Tobacco Use   Smoking status: Former    Current packs/day: 0.00    Average packs/day: 1.5 packs/day for 30.0 years (45.0 ttl pk-yrs)    Types: Cigarettes    Start date: 04/20/1973    Quit date: 04/21/2003    Years since quitting: 20.1   Smokeless tobacco: Never  Vaping Use   Vaping status: Never Used  Substance and Sexual Activity   Alcohol use: Yes    Alcohol/week: 2.0 standard drinks of alcohol    Types: 2 Standard drinks or equivalent per week    Comment: OCCAS   Drug use: No   Sexual activity: Not Currently    Birth control/protection: None  Other Topics Concern   Not on file  Social History Narrative   3 caffeine drinks daily    Social Drivers of Health   Financial Resource Strain: Low Risk  (01/18/2023)   Overall Financial Resource Strain (CARDIA)    Difficulty of Paying Living Expenses: Not hard at all  Food Insecurity: No Food Insecurity (01/18/2023)   Hunger Vital Sign    Worried About Running Out of Food in the Last Year: Never true    Ran Out of Food in the Last Year: Never true  Transportation Needs: No Transportation Needs (01/18/2023)   PRAPARE - Administrator, Civil Service (Medical): No    Lack of Transportation (Non-Medical): No  Physical Activity: Sufficiently Active (01/18/2023)   Exercise Vital Sign    Days of Exercise per Week: 5 days    Minutes of Exercise  per Session: 30 min  Stress: No Stress Concern Present (01/18/2023)   [employer] of Occupational Health - Occupational Stress Questionnaire    Feeling of Stress : Not at all  Social Connections: Socially Isolated (01/18/2023)   Social Connection and Isolation Panel [NHANES]    Frequency of Communication with Friends and Family: More than three times a week    Frequency of Social Gatherings with Friends and Family: Once a week    Attends Religious Services: Never    Database administrator or Organizations: No    Attends  Club or Organization Meetings: Never    Marital Status: Widowed     Family History: The patient's family history includes Colon polyps in her mother; Diabetes in her mother; Hyperlipidemia in her mother; Hypertension in her mother; Stroke in her father. There is no history of Colon cancer.  ROS:   Please see the history of present illness.    2+ pitting edema bilateral LE All other systems reviewed and are negative.  Labs/Other Studies Reviewed:    The following studies were reviewed today:  Cardiac Studies & Procedures       ECHOCARDIOGRAM  ECHOCARDIOGRAM LIMITED 05/22/2020  Sonographer Comments: Patient is morbidly obese. Image acquisition challenging due to patient body habitus and Image acquisition challenging due to COPD. COVID+ IMPRESSIONS   1. Left ventricular ejection fraction, by estimation, is 50 to 55%. The left ventricle has low normal function. Left ventricular endocardial border not optimally defined to evaluate regional wall motion. There is mild left ventricular hypertrophy. Left ventricular diastolic parameters are indeterminate. 2. Right ventricle is not well visualized but grossly normal size and systolic function. The estimated right ventricular systolic pressure is 34.4 mmHg. 3. The mitral valve is normal in structure. No evidence of mitral valve regurgitation. 4. The aortic valve was not well visualized. Aortic valve regurgitation is  not visualized. 5. The inferior vena cava is dilated in size with >50% respiratory variability, suggesting right atrial pressure of 8 mmHg.  FINDINGS Left Ventricle: Left ventricular ejection fraction, by estimation, is 50 to 55%. The left ventricle has low normal function. Left ventricular endocardial border not optimally defined to evaluate regional wall motion. The left ventricular internal cavity size was normal in size. There is mild left ventricular hypertrophy. Left ventricular diastolic parameters are indeterminate.  Right Ventricle: The right ventricular size is normal. Right ventricular systolic function is normal. There is normal pulmonary artery systolic pressure. The tricuspid regurgitant velocity is 2.57 m/s, and with an assumed right atrial pressure of 8 mmHg, the estimated right ventricular systolic pressure is 34.4 mmHg.  Pericardium: There is no evidence of pericardial effusion.  Mitral Valve: The mitral valve is normal in structure.  Tricuspid Valve: The tricuspid valve is normal in structure. Tricuspid valve regurgitation is trivial.  Aortic Valve: The aortic valve was not well visualized. Aortic valve regurgitation is not visualized.  Pulmonic Valve: The pulmonic valve was not well visualized. Pulmonic valve regurgitation is not visualized.  Aorta: The aortic root is normal in size and structure.  Venous: The inferior vena cava is dilated in size with greater than 50% respiratory variability, suggesting right atrial pressure of 8 mmHg.  IAS/Shunts: The interatrial septum was not well visualized.              Recent Labs: 12/17/2022: B Natriuretic Peptide 204.2 04/12/2023: TSH 3.89 05/31/2023: ALT 10; BUN 14; Creatinine, Ser 0.82; Hemoglobin 12.0; Platelets 193.0; Potassium 4.5; Sodium 142  Recent Lipid Panel    Component Value Date/Time   CHOL 119 04/12/2023 1512   TRIG 117.0 04/12/2023 1512   HDL 51.30 04/12/2023 1512   CHOLHDL 2 04/12/2023 1512   VLDL 23.4  04/12/2023 1512   LDLCALC 45 04/12/2023 1512   LDLDIRECT 111.0 07/09/2020 1441     Risk Assessment/Calculations:    CHA2DS2-VASc Score = 7   This indicates a 11.2% annual risk of stroke. The patient's score is based upon: CHF History: 1 HTN History: 1 Diabetes History: 1 Stroke History: 2 Vascular Disease History: 0 Age Score: 1 Gender Score: 1  Physical Exam:    VS:  There were no vitals taken for this visit.    Wt Readings from Last 3 Encounters:  05/17/23 264 lb 3.2 oz (119.8 kg)  05/10/23 268 lb 3.2 oz (121.7 kg)  04/12/23 261 lb (118.4 kg)     Affect appropriate Healthy:  appears stated age HEENT: normal Neck supple with no adenopathy JVP normal no bruits no thyromegaly Lungs clear with no wheezing and good diaphragmatic motion Heart:  S1/S2 no murmur, no rub, gallop or click PMI normal Abdomen: benighn, BS positve, no tenderness, no AAA no bruit.  No HSM or HJR Distal pulses intact with no bruits Plus one bilateral edema Neuro non-focal Skin warm and dry No muscular weakness t   EKG:  EKG is not ordered today.   Diagnoses:    No diagnosis found.  Assessment and Plan:     Longstanding atrial fibrillation on chronic anticoagulation: Remains asymptomatic with HR well controlled. No bleeding concerns. Continue Eliquis 5 mg twice daily which is appropriate dose for stroke prevention and metoprolol for rate control.   Chronic HFpEF/Leg edema: LVEF 50-55%, indeterminate diastolic parameters on 06/20/20. 2+ pitting edema bilateral LE for which she has increased Lasix to 40 mg daily Low sodium diet and elevate legs latter in day  Hypertension: BP is well controlled. Renal function is stable on lab results 04/25/2022.   Obesity: Encouraged weight loss. 150 minutes of moderate intensity exercise each week and heart healthy, mostly plant-based diet.  Ortho:  f/u Duda fracture right distal phalanx first digit    Disposition:  F/U in a year   Medication  Adjustments/Labs and Tests Ordered: Current medicines are reviewed at length with the patient today.  Concerns regarding medicines are outlined above.  No orders of the defined types were placed in this encounter.  No orders of the defined types were placed in this encounter.   There are no Patient Instructions on file for this visit.   Signed, Charlton Haws, MD  06/15/2023 10:22 AM    [facility]

## 2023-06-22 ENCOUNTER — Telehealth: Payer: Self-pay

## 2023-06-22 NOTE — Telephone Encounter (Signed)
 I called Andrea Perry fm [facility] because I was unable to find a DL for pt for her OSA. [name] did not find her in their system.   I called and spoke to pt regarding her appointment tomorrow. Pt states she is not using anything to treat her OSA.

## 2023-06-23 ENCOUNTER — Ambulatory Visit: Payer: Medicare Other | Admitting: Primary Care

## 2023-06-23 ENCOUNTER — Ambulatory Visit: Payer: Medicare Other | Admitting: Cardiovascular Disease

## 2023-07-01 DIAGNOSIS — Z90710 Acquired absence of both cervix and uterus: Secondary | ICD-10-CM | POA: Diagnosis not present

## 2023-07-01 DIAGNOSIS — N3289 Other specified disorders of bladder: Secondary | ICD-10-CM | POA: Diagnosis not present

## 2023-07-01 DIAGNOSIS — R3121 Asymptomatic microscopic hematuria: Secondary | ICD-10-CM | POA: Diagnosis not present

## 2023-07-01 DIAGNOSIS — R3129 Other microscopic hematuria: Secondary | ICD-10-CM | POA: Diagnosis not present

## 2023-07-14 ENCOUNTER — Encounter: Payer: Self-pay | Admitting: Internal Medicine

## 2023-07-15 ENCOUNTER — Ambulatory Visit: Admitting: Nurse Practitioner

## 2023-07-28 ENCOUNTER — Ambulatory Visit: Admitting: Nurse Practitioner

## 2023-08-04 ENCOUNTER — Encounter: Payer: Self-pay | Admitting: Emergency Medicine

## 2023-08-04 ENCOUNTER — Ambulatory Visit (INDEPENDENT_AMBULATORY_CARE_PROVIDER_SITE_OTHER): Admitting: Emergency Medicine

## 2023-08-04 VITALS — BP 140/67 | HR 96 | Ht 62.0 in | Wt 263.6 lb

## 2023-08-04 DIAGNOSIS — J301 Allergic rhinitis due to pollen: Secondary | ICD-10-CM

## 2023-08-04 DIAGNOSIS — R053 Chronic cough: Secondary | ICD-10-CM

## 2023-08-04 DIAGNOSIS — J438 Other emphysema: Secondary | ICD-10-CM | POA: Diagnosis not present

## 2023-08-04 DIAGNOSIS — G4733 Obstructive sleep apnea (adult) (pediatric): Secondary | ICD-10-CM | POA: Diagnosis not present

## 2023-08-04 DIAGNOSIS — I272 Pulmonary hypertension, unspecified: Secondary | ICD-10-CM | POA: Diagnosis not present

## 2023-08-04 MED ORDER — BREZTRI AEROSPHERE 160-9-4.8 MCG/ACT IN AERO: 2.0000 | INHALATION_SPRAY | Freq: Two times a day (BID) | RESPIRATORY_TRACT | 11 refills | Status: DC

## 2023-08-04 MED ORDER — BREZTRI AEROSPHERE 160-9-4.8 MCG/ACT IN AERO: INHALATION_SPRAY | RESPIRATORY_TRACT | Status: DC

## 2023-08-04 MED ORDER — IPRATROPIUM-ALBUTEROL 0.5-2.5 (3) MG/3ML IN SOLN: 3.0000 mL | Freq: Four times a day (QID) | RESPIRATORY_TRACT | 0 refills | Status: AC | PRN

## 2023-08-04 NOTE — Assessment & Plan Note (Signed)
 We will consider performing a walking oximetry at your next office visit

## 2023-08-04 NOTE — Assessment & Plan Note (Signed)
Continue your Zyrtec as you have been taking it.

## 2023-08-04 NOTE — Assessment & Plan Note (Signed)
 Please continue Breztri 2 puffs twice a day.  Rinse and gargle after using.  We will refill this medication for you today. Use your DuoNeb (albuterol/ipratropium) up to every 6 hours if needed for shortness of breath, chest tightness, wheezing. Keep albuterol available to use either 2 puffs or 1 nebulizer treatment if needed for shortness of breath Follow Dr. Baldwin Levee in 6 months, sooner if you have any problems

## 2023-08-04 NOTE — Assessment & Plan Note (Signed)
 Not currently on CPAP

## 2023-08-04 NOTE — Patient Instructions (Addendum)
 Please continue Breztri 2 puffs twice a day.  Rinse and gargle after using.  We will refill this medication for you today. Use your DuoNeb (albuterol/ipratropium) up to every 6 hours if needed for shortness of breath, chest tightness, wheezing. Keep albuterol available to use either 2 puffs or 1 nebulizer treatment if needed for shortness of breath Continue your Zyrtec as you have been taking it We will consider performing a walking oximetry at your next office visit Follow Dr. Baldwin Levee in 6 months, sooner if you have any problems.

## 2023-08-04 NOTE — Progress Notes (Signed)
 Subjective:    Patient ID: Andrea Perry, female    DOB: January 19, 1951, 73 y.o.   MRN: [id number]  HPI  ROV 10/02/2022 --73 year old woman with COPD and asthmatic features with a positive bronchodilator response.  She has mild peripheral eosinophilia and chronic rhinitis.  PMH also significant for A-fib, hypertension, diabetes, secondary PAH.  She is managed on Breztri, has DuoNeb available to use if needed - using tid.  Also has been on pantoprazole, fluticasone nasal spray, Zyrtec.  She was hospitalized in January with an acute exacerbation of her COPD.  Today she reports. She is able to exert but slowly. Has some bad days, somewhat random. Having increased cough and congestion, nasal obstruction.   ROV 08/04/2023 --follow-up visit for [facility].  She is 73 years old with a history of COPD and asthmatic features (positive bronchodilator response), mild peripheral eosinophilia with chronic rhinitis. PMH: A-fib, hypertension, diabetes, secondary PAH, OSA not on CPAP We have been managing her on Breztri, DuoNeb if needed, pantoprazole, fluticasone nasal spray, Zyrtec. She reports that her breathing has been up and down - corresponds with her level of exertion. She does some light house work. She is able to shop. Limited by breathing and leg weakness. She is on breztri - feels that she benefits. She is using duoneb about 2-3x a day. No other hospitalizations since last time. She would benefit from pt assistance program for the breztri.     Review of Systems  Constitutional:  Negative for fever and unexpected weight change.  HENT:  Negative for congestion, dental problem, ear pain, nosebleeds, postnasal drip, rhinorrhea, sinus pressure, sneezing, sore throat and trouble swallowing.   Eyes:  Negative for redness and itching.  Respiratory:  Positive for cough and shortness of breath. Negative for chest tightness and wheezing.   Cardiovascular:  Negative for palpitations and leg swelling.  Gastrointestinal:   Negative for nausea and vomiting.  Genitourinary:  Negative for dysuria.  Musculoskeletal:  Negative for joint swelling.  Skin:  Negative for rash.  Neurological:  Negative for headaches.  Hematological:  Does not bruise/bleed easily.  Psychiatric/Behavioral:  Negative for dysphoric mood. The patient is not nervous/anxious.        Objective:   Physical Exam Vitals:   08/04/23 1404  BP: (!) 140/67  Pulse: 96  SpO2: 95%  Weight: 263 lb 9.6 oz (119.6 kg)  Height: 5\' 2"  (1.575 m)    Gen: Pleasant, obese woman,  in no distress,  normal affect  ENT: No lesions,  mouth clear,  oropharynx clear, no postnasal drip  Neck: No JVD, no stridor  Lungs: No use of accessory muscles, clear, distant at both bases  Cardiovascular: RRR, heart sounds normal, no murmur or gallops, bilateral foot edema with some erythema, no weeping, no lesions or sores  Musculoskeletal: No deformities, venous insufficiency  Neuro: alert, non focal  Skin: ankles cool, no lesions or rashes      Assessment & Plan:  COPD (chronic obstructive pulmonary disease) (HCC) Please continue Breztri 2 puffs twice a day.  Rinse and gargle after using.  We will refill this medication for you today. Use your DuoNeb (albuterol/ipratropium) up to every 6 hours if needed for shortness of breath, chest tightness, wheezing. Keep albuterol available to use either 2 puffs or 1 nebulizer treatment if needed for shortness of breath Follow Dr. Delton Coombes in 6 months, sooner if you have any problems  Allergic rhinitis Continue your Zyrtec as you have been taking it  Mild obstructive sleep  apnea Not currently on CPAP  Pulmonary HTN (HCC) We will consider performing a walking oximetry at your next office visit  Time spent 30 minutes  Racheal Buddle, MD, PhD 08/04/2023, 2:24 PM [facility] [phone] or if no answer [phone]

## 2023-08-05 ENCOUNTER — Telehealth: Payer: Self-pay

## 2023-08-05 ENCOUNTER — Telehealth: Payer: Self-pay | Admitting: Emergency Medicine

## 2023-08-05 DIAGNOSIS — R053 Chronic cough: Secondary | ICD-10-CM

## 2023-08-05 DIAGNOSIS — J438 Other emphysema: Secondary | ICD-10-CM

## 2023-08-05 NOTE — Telephone Encounter (Signed)
 [place] [employer] forms signed & placed in fax folder.

## 2023-08-05 NOTE — Telephone Encounter (Signed)
  Per Devra Fontana at [employer]-  we can't provide supplies for Nebulizer that was not received with us ....now with her insurance depending on how long she has had the machine they may pay for another one so if you want you can send a new order for a nebulizer administration kit and supplies then we can try and process for a machine and the suppies.  Looks like if we just get a new referral for nebulizer administration kit and supplies she will be set. Please advise

## 2023-08-09 NOTE — Telephone Encounter (Signed)
 Lm for patient. Will need to know how long she has had current nebulizer machine.

## 2023-08-13 NOTE — Telephone Encounter (Signed)
 Spoke to pt. She stated that her nebulizer machine is >71 years old.  Order placed for new machine.  Nothing further needed.

## 2023-08-20 ENCOUNTER — Telehealth: Payer: Self-pay

## 2023-08-20 NOTE — Telephone Encounter (Signed)
 Copied from CRM [id number]. Topic: Clinical - Medication Question >> Aug 19, 2023  4:27 PM Isabell A wrote: Reason for CRM: Patient would like to know when she will be receiving her nebulizer.   Lm on Cell okay per HIPAA. Explained we only place the orders we have no way of tracking or knowing of when it will arrive she would have to contact the DME

## 2023-08-27 DIAGNOSIS — Z1231 Encounter for screening mammogram for malignant neoplasm of breast: Secondary | ICD-10-CM | POA: Diagnosis not present

## 2023-09-03 ENCOUNTER — Other Ambulatory Visit: Payer: Self-pay | Admitting: Internal Medicine

## 2023-09-03 ENCOUNTER — Other Ambulatory Visit: Payer: Self-pay | Admitting: Cardiovascular Disease

## 2023-09-03 DIAGNOSIS — I4811 Longstanding persistent atrial fibrillation: Secondary | ICD-10-CM

## 2023-09-03 NOTE — Telephone Encounter (Unsigned)
 Copied from CRM [id number]. Topic: Clinical - Medication Refill >> Sep 03, 2023  4:57 PM Sophia H wrote: Medication: apixaban  (ELIQUIS ) 5 MG TABS tablet levothyroxine  (SYNTHROID ) 50 MCG tablet pantoprazole  (PROTONIX ) 40 MG tablet escitalopram  (LEXAPRO ) 20 MG tablet  Has the patient contacted their pharmacy? Yes (Agent: If no, request that the patient contact the pharmacy for the refill. If patient does not wish to contact the pharmacy document the reason why and proceed with request.) (Agent: If yes, when and what did the pharmacy advise?)  This is the patient's preferred pharmacy:  [id number] - [place] - [address] [place]) [place] [address] Phone: [phone] Fax: [phone]   Is this the correct pharmacy for this prescription? Yes If no, delete pharmacy and type the correct one.   Has the prescription been filled recently? Yes  Is the patient out of the medication? Yes  Has the patient been seen for an appointment in the last year OR does the patient have an upcoming appointment? Yes  Can we respond through MyChart? No, patient prefers phone call  Agent: Please be advised that Rx refills may take up to 3 business days. We ask that you follow-up with your pharmacy.

## 2023-09-06 ENCOUNTER — Other Ambulatory Visit: Payer: Self-pay

## 2023-09-06 MED ORDER — PANTOPRAZOLE SODIUM 40 MG PO TBEC: 40.0000 mg | DELAYED_RELEASE_TABLET | Freq: Every day | ORAL | 3 refills | Status: AC

## 2023-09-06 MED ORDER — LEVOTHYROXINE SODIUM 50 MCG PO TABS: 50.0000 ug | ORAL_TABLET | Freq: Every day | ORAL | 0 refills | Status: DC

## 2023-09-06 MED ORDER — ROSUVASTATIN CALCIUM 40 MG PO TABS: 40.0000 mg | ORAL_TABLET | Freq: Every day | ORAL | 1 refills | Status: AC

## 2023-09-06 MED ORDER — LEVOTHYROXINE SODIUM 50 MCG PO TABS
50.0000 ug | ORAL_TABLET | Freq: Every day | ORAL | 1 refills | Status: DC
Start: 2023-09-06 — End: 2023-11-18

## 2023-09-06 MED ORDER — ESCITALOPRAM OXALATE 20 MG PO TABS: 20.0000 mg | ORAL_TABLET | Freq: Every day | ORAL | 1 refills | Status: AC

## 2023-09-06 MED ORDER — ESCITALOPRAM OXALATE 20 MG PO TABS: 20.0000 mg | ORAL_TABLET | Freq: Every day | ORAL | 0 refills | Status: DC

## 2023-09-10 ENCOUNTER — Encounter: Payer: Self-pay | Admitting: Internal Medicine

## 2023-09-10 ENCOUNTER — Other Ambulatory Visit: Payer: Self-pay | Admitting: Internal Medicine

## 2023-09-10 NOTE — Telephone Encounter (Signed)
 Duplicate, see refill encounter dated today. Lan Pin, CMA routed this conversation to Roslyn Coombe, MD   09/10/23  1:06 PM Interface, Surescripts Out routed this conversation to [employer] Rx Refill  This encounter was created in error - please disregard.

## 2023-09-10 NOTE — Telephone Encounter (Signed)
 Copied from CRM [id number]. Topic: Clinical - Medication Refill >> Sep 10, 2023  1:09 PM Turkey A wrote: Medication: LORazepam  (ATIVAN ) 1 MG tablet  Has the patient contacted their pharmacy? Yes (Agent: If no, request that the patient contact the pharmacy for the refill. If patient does not wish to contact the pharmacy document the reason why and proceed with request.) (Agent: If yes, when and what did the pharmacy advise?) Need refill to be faxed  This is the patient's preferred pharmacy:  [facility] - [place] ([place]), [place] - [address] [place] ([place]) [place] [address] Phone: [phone] Fax: [phone]   Is this the correct pharmacy for this prescription? Yes If no, delete pharmacy and type the correct one.   Has the prescription been filled recently? No  Is the patient out of the medication? Yes  Has the patient been seen for an appointment in the last year OR does the patient have an upcoming appointment? Yes  Can we respond through MyChart? Yes  Agent: Please be advised that Rx refills may take up to 3 business days. We ask that you follow-up with your pharmacy.

## 2023-09-15 ENCOUNTER — Ambulatory Visit (INDEPENDENT_AMBULATORY_CARE_PROVIDER_SITE_OTHER): Admitting: Family Medicine

## 2023-09-15 ENCOUNTER — Encounter: Payer: Self-pay | Admitting: Family Medicine

## 2023-09-15 VITALS — BP 138/82 | HR 79 | Temp 98.0°F | Ht 62.0 in | Wt 264.0 lb

## 2023-09-15 DIAGNOSIS — R062 Wheezing: Secondary | ICD-10-CM

## 2023-09-15 DIAGNOSIS — J441 Chronic obstructive pulmonary disease with (acute) exacerbation: Secondary | ICD-10-CM

## 2023-09-15 MED ORDER — PREDNISONE 10 MG (21) PO TBPK: ORAL_TABLET | Freq: Every day | ORAL | 0 refills | Status: AC

## 2023-09-15 MED ORDER — DOXYCYCLINE HYCLATE 100 MG PO CAPS: 100.0000 mg | ORAL_CAPSULE | Freq: Two times a day (BID) | ORAL | 0 refills | Status: AC

## 2023-09-15 MED ORDER — METHYLPREDNISOLONE ACETATE 40 MG/ML IJ SUSP
40.0000 mg | Freq: Once | INTRAMUSCULAR | Status: AC
  Administered 2023-09-15: 40 mg via INTRAMUSCULAR

## 2023-09-15 NOTE — Patient Instructions (Signed)
 I have sent in a prescription for doxycycline  for you to take twice a day for 7 days.  Take this medication with food as it can upset your stomach if you do not.  Take by mouth daily for 6 days. Take 6 tablets on day 1, 5 tablets on day 2, 4 tablets on day 3, 3 tablets on day 4, 2 tablets on day 5, 1 tablet on day 6  You have received a steroid injection in the office today.  Follow-up with me for new or worsening symptoms.

## 2023-09-15 NOTE — Progress Notes (Signed)
 Acute Office Visit  Subjective:     Patient ID: Andrea Perry, female    DOB: 1950/12/22, 73 y.o.   MRN: [id number]  Chief Complaint  Patient presents with   Acute Visit    Ongoing for about 4 days, SOB, wheezing, deep cough. Green mucus, tried robitussin DM     HPI Patient is in today for cough, shortness of breath and wheezing, fatigue for the last 4 days.  Is coughing up dark green discolored sputum. Has taken Robitussin over-the-counter with little relief. Hx COPD, emphysema. Has been using neb tx at home every 4 hours, has been compliant with Breztri . States that everyone in her house has been sick recently. Denies abdominal pain, nausea, vomiting, diarrhea, rash, fever, other symptoms. Medical history as outlined below.  ROS Per HPI      Objective:    BP 138/82 (BP Location: Left Arm, Patient Position: Sitting)   Pulse 79   Temp 98 F (36.7 C) (Temporal)   Ht 5\' 2"  (1.575 m)   Wt 264 lb (119.7 kg)   SpO2 92%   BMI 48.29 kg/m    Physical Exam Vitals and nursing note reviewed.  Constitutional:      General: She is not in acute distress.    Appearance: She is obese.     Comments: Appears fatigued  HENT:     Head: Normocephalic and atraumatic.     Right Ear: External ear normal.     Left Ear: External ear normal.     Nose: No congestion.     Mouth/Throat:     Mouth: Mucous membranes are moist.     Pharynx: Oropharynx is clear. No oropharyngeal exudate or posterior oropharyngeal erythema.  Eyes:     Extraocular Movements: Extraocular movements intact.  Cardiovascular:     Rate and Rhythm: Normal rate and regular rhythm.     Heart sounds: Normal heart sounds.  Pulmonary:     Effort: Pulmonary effort is normal. No respiratory distress.     Breath sounds: Wheezing and rhonchi present. No rales.  Musculoskeletal:     Cervical back: Normal range of motion and neck supple.  Lymphadenopathy:     Cervical: No cervical adenopathy.  Skin:    General: Skin  is warm and dry.  Neurological:     General: No focal deficit present.     Mental Status: She is alert and oriented to person, place, and time.     No results found for any visits on 09/15/23.      Assessment & Plan:   COPD with acute exacerbation (HCC) -     Doxycycline  Hyclate; Take 1 capsule (100 mg total) by mouth 2 (two) times daily for 7 days.  Dispense: 14 capsule; Refill: 0 -     predniSONE ; Take by mouth daily for 6 days. Take 6 tablets on day 1, 5 tablets on day 2, 4 tablets on day 3, 3 tablets on day 4, 2 tablets on day 5, 1 tablet on day 6  Dispense: 21 tablet; Refill: 0 -     methylPREDNISolone  Acetate  Wheezing -     predniSONE ; Take by mouth daily for 6 days. Take 6 tablets on day 1, 5 tablets on day 2, 4 tablets on day 3, 3 tablets on day 4, 2 tablets on day 5, 1 tablet on day 6  Dispense: 21 tablet; Refill: 0 -     methylPREDNISolone  Acetate     Meds ordered this encounter  Medications  doxycycline  (VIBRAMYCIN ) 100 MG capsule    Sig: Take 1 capsule (100 mg total) by mouth 2 (two) times daily for 7 days.    Dispense:  14 capsule    Refill:  0   predniSONE  (STERAPRED UNI-PAK 21 TAB) 10 MG (21) TBPK tablet    Sig: Take by mouth daily for 6 days. Take 6 tablets on day 1, 5 tablets on day 2, 4 tablets on day 3, 3 tablets on day 4, 2 tablets on day 5, 1 tablet on day 6    Dispense:  21 tablet    Refill:  0   methylPREDNISolone  acetate (DEPO-MEDROL ) injection 40 mg    Return if symptoms worsen or fail to improve.  Wellington Half, FNP

## 2023-10-28 DIAGNOSIS — R3121 Asymptomatic microscopic hematuria: Secondary | ICD-10-CM | POA: Diagnosis not present

## 2023-10-28 DIAGNOSIS — N3941 Urge incontinence: Secondary | ICD-10-CM | POA: Diagnosis not present

## 2023-11-04 ENCOUNTER — Other Ambulatory Visit: Payer: Self-pay | Admitting: Cardiovascular Disease

## 2023-11-04 DIAGNOSIS — I4811 Longstanding persistent atrial fibrillation: Secondary | ICD-10-CM

## 2023-11-17 ENCOUNTER — Emergency Department (HOSPITAL_COMMUNITY)

## 2023-11-17 ENCOUNTER — Inpatient Hospital Stay (HOSPITAL_COMMUNITY)
Admission: EM | Admit: 2023-11-17 | Discharge: 2023-11-21 | DRG: 871 | Disposition: A | Discharge status: Home / Self Care | Attending: Family Medicine | Admitting: Family Medicine

## 2023-11-17 ENCOUNTER — Encounter (HOSPITAL_COMMUNITY): Payer: Self-pay

## 2023-11-17 ENCOUNTER — Ambulatory Visit (HOSPITAL_COMMUNITY): Admission: EM | Admit: 2023-11-17 | Discharge: 2023-11-17 | Disposition: A | Discharge status: Home / Self Care

## 2023-11-17 ENCOUNTER — Other Ambulatory Visit: Payer: Self-pay

## 2023-11-17 DIAGNOSIS — Z8709 Personal history of other diseases of the respiratory system: Secondary | ICD-10-CM | POA: Diagnosis not present

## 2023-11-17 DIAGNOSIS — Z85820 Personal history of malignant melanoma of skin: Secondary | ICD-10-CM

## 2023-11-17 DIAGNOSIS — E876 Hypokalemia: Secondary | ICD-10-CM | POA: Diagnosis present

## 2023-11-17 DIAGNOSIS — Z8249 Family history of ischemic heart disease and other diseases of the circulatory system: Secondary | ICD-10-CM

## 2023-11-17 DIAGNOSIS — Z1152 Encounter for screening for COVID-19: Secondary | ICD-10-CM

## 2023-11-17 DIAGNOSIS — N170 Acute kidney failure with tubular necrosis: Secondary | ICD-10-CM | POA: Diagnosis present

## 2023-11-17 DIAGNOSIS — Z794 Long term (current) use of insulin: Secondary | ICD-10-CM

## 2023-11-17 DIAGNOSIS — I700 Atherosclerosis of aorta: Secondary | ICD-10-CM | POA: Diagnosis present

## 2023-11-17 DIAGNOSIS — N1831 Chronic kidney disease, stage 3a: Secondary | ICD-10-CM | POA: Diagnosis present

## 2023-11-17 DIAGNOSIS — J439 Emphysema, unspecified: Secondary | ICD-10-CM | POA: Diagnosis present

## 2023-11-17 DIAGNOSIS — Z87442 Personal history of urinary calculi: Secondary | ICD-10-CM | POA: Diagnosis not present

## 2023-11-17 DIAGNOSIS — R7989 Other specified abnormal findings of blood chemistry: Secondary | ICD-10-CM | POA: Diagnosis not present

## 2023-11-17 DIAGNOSIS — L0390 Cellulitis, unspecified: Secondary | ICD-10-CM

## 2023-11-17 DIAGNOSIS — Z7984 Long term (current) use of oral hypoglycemic drugs: Secondary | ICD-10-CM

## 2023-11-17 DIAGNOSIS — E119 Type 2 diabetes mellitus without complications: Secondary | ICD-10-CM

## 2023-11-17 DIAGNOSIS — Z7901 Long term (current) use of anticoagulants: Secondary | ICD-10-CM

## 2023-11-17 DIAGNOSIS — B354 Tinea corporis: Secondary | ICD-10-CM | POA: Diagnosis present

## 2023-11-17 DIAGNOSIS — N179 Acute kidney failure, unspecified: Secondary | ICD-10-CM | POA: Diagnosis not present

## 2023-11-17 DIAGNOSIS — R21 Rash and other nonspecific skin eruption: Secondary | ICD-10-CM

## 2023-11-17 DIAGNOSIS — R0609 Other forms of dyspnea: Secondary | ICD-10-CM | POA: Diagnosis not present

## 2023-11-17 DIAGNOSIS — I4821 Permanent atrial fibrillation: Secondary | ICD-10-CM | POA: Diagnosis present

## 2023-11-17 DIAGNOSIS — A419 Sepsis, unspecified organism: Secondary | ICD-10-CM | POA: Diagnosis present

## 2023-11-17 DIAGNOSIS — E871 Hypo-osmolality and hyponatremia: Secondary | ICD-10-CM | POA: Diagnosis present

## 2023-11-17 DIAGNOSIS — F319 Bipolar disorder, unspecified: Secondary | ICD-10-CM | POA: Diagnosis present

## 2023-11-17 DIAGNOSIS — I10 Essential (primary) hypertension: Secondary | ICD-10-CM | POA: Diagnosis present

## 2023-11-17 DIAGNOSIS — Z79899 Other long term (current) drug therapy: Secondary | ICD-10-CM

## 2023-11-17 DIAGNOSIS — I272 Pulmonary hypertension, unspecified: Secondary | ICD-10-CM | POA: Diagnosis present

## 2023-11-17 DIAGNOSIS — Z823 Family history of stroke: Secondary | ICD-10-CM

## 2023-11-17 DIAGNOSIS — G4733 Obstructive sleep apnea (adult) (pediatric): Secondary | ICD-10-CM | POA: Diagnosis present

## 2023-11-17 DIAGNOSIS — E785 Hyperlipidemia, unspecified: Secondary | ICD-10-CM | POA: Diagnosis present

## 2023-11-17 DIAGNOSIS — E039 Hypothyroidism, unspecified: Secondary | ICD-10-CM | POA: Diagnosis present

## 2023-11-17 DIAGNOSIS — N3281 Overactive bladder: Secondary | ICD-10-CM | POA: Diagnosis not present

## 2023-11-17 DIAGNOSIS — Z833 Family history of diabetes mellitus: Secondary | ICD-10-CM

## 2023-11-17 DIAGNOSIS — E7849 Other hyperlipidemia: Secondary | ICD-10-CM | POA: Diagnosis not present

## 2023-11-17 DIAGNOSIS — Z87891 Personal history of nicotine dependence: Secondary | ICD-10-CM

## 2023-11-17 DIAGNOSIS — F411 Generalized anxiety disorder: Secondary | ICD-10-CM | POA: Diagnosis present

## 2023-11-17 DIAGNOSIS — R6521 Severe sepsis with septic shock: Secondary | ICD-10-CM | POA: Diagnosis not present

## 2023-11-17 DIAGNOSIS — R0602 Shortness of breath: Secondary | ICD-10-CM | POA: Diagnosis not present

## 2023-11-17 DIAGNOSIS — I4819 Other persistent atrial fibrillation: Secondary | ICD-10-CM | POA: Diagnosis present

## 2023-11-17 DIAGNOSIS — Z6841 Body Mass Index (BMI) 40.0 and over, adult: Secondary | ICD-10-CM

## 2023-11-17 DIAGNOSIS — Z83438 Family history of other disorder of lipoprotein metabolism and other lipidemia: Secondary | ICD-10-CM

## 2023-11-17 DIAGNOSIS — E1122 Type 2 diabetes mellitus with diabetic chronic kidney disease: Secondary | ICD-10-CM | POA: Diagnosis present

## 2023-11-17 DIAGNOSIS — I130 Hypertensive heart and chronic kidney disease with heart failure and stage 1 through stage 4 chronic kidney disease, or unspecified chronic kidney disease: Secondary | ICD-10-CM | POA: Diagnosis present

## 2023-11-17 DIAGNOSIS — I959 Hypotension, unspecified: Secondary | ICD-10-CM | POA: Diagnosis not present

## 2023-11-17 DIAGNOSIS — L03115 Cellulitis of right lower limb: Secondary | ICD-10-CM | POA: Diagnosis present

## 2023-11-17 DIAGNOSIS — R109 Unspecified abdominal pain: Secondary | ICD-10-CM | POA: Diagnosis not present

## 2023-11-17 DIAGNOSIS — E66813 Obesity, class 3: Secondary | ICD-10-CM | POA: Diagnosis present

## 2023-11-17 DIAGNOSIS — Z90710 Acquired absence of both cervix and uterus: Secondary | ICD-10-CM

## 2023-11-17 DIAGNOSIS — K219 Gastro-esophageal reflux disease without esophagitis: Secondary | ICD-10-CM | POA: Diagnosis present

## 2023-11-17 DIAGNOSIS — R509 Fever, unspecified: Secondary | ICD-10-CM

## 2023-11-17 DIAGNOSIS — I5032 Chronic diastolic (congestive) heart failure: Secondary | ICD-10-CM | POA: Diagnosis present

## 2023-11-17 DIAGNOSIS — Z79890 Hormone replacement therapy (postmenopausal): Secondary | ICD-10-CM

## 2023-11-17 DIAGNOSIS — N200 Calculus of kidney: Secondary | ICD-10-CM | POA: Diagnosis not present

## 2023-11-17 DIAGNOSIS — Z85828 Personal history of other malignant neoplasm of skin: Secondary | ICD-10-CM

## 2023-11-17 DIAGNOSIS — Z885 Allergy status to narcotic agent status: Secondary | ICD-10-CM

## 2023-11-17 DIAGNOSIS — I2510 Atherosclerotic heart disease of native coronary artery without angina pectoris: Secondary | ICD-10-CM | POA: Diagnosis not present

## 2023-11-17 DIAGNOSIS — Z91048 Other nonmedicinal substance allergy status: Secondary | ICD-10-CM

## 2023-11-17 LAB — URINALYSIS, W/ REFLEX TO CULTURE (INFECTION SUSPECTED)
Bilirubin Urine: NEGATIVE
Glucose, UA: NEGATIVE mg/dL
Ketones, ur: NEGATIVE mg/dL
Leukocytes,Ua: NEGATIVE
Nitrite: NEGATIVE
Protein, ur: 100 mg/dL — AB
RBC / HPF: >50 RBC/hpf (ref 0–5)
Specific Gravity, Urine: 1.011 (ref 1.005–1.030)
pH: 5 (ref 5.0–8.0)

## 2023-11-17 LAB — COMPREHENSIVE METABOLIC PANEL WITH GFR
ALT: 16 U/L (ref 0–44)
AST: 31 U/L (ref 15–41)
Albumin: 2.7 g/dL — ABNORMAL LOW (ref 3.5–5.0)
Alkaline Phosphatase: 60 U/L (ref 38–126)
Anion gap: 15 (ref 5–15)
BUN: 14 mg/dL (ref 8–23)
CO2: 20 mmol/L — ABNORMAL LOW (ref 22–32)
Calcium: 7.5 mg/dL — ABNORMAL LOW (ref 8.9–10.3)
Chloride: 97 mmol/L — ABNORMAL LOW (ref 98–111)
Creatinine, Ser: 1.58 mg/dL — ABNORMAL HIGH (ref 0.44–1.00)
GFR, Estimated: 35 mL/min — ABNORMAL LOW (ref >60)
Glucose, Bld: 236 mg/dL — ABNORMAL HIGH (ref 70–99)
Potassium: 3.2 mmol/L — ABNORMAL LOW (ref 3.5–5.1)
Sodium: 132 mmol/L — ABNORMAL LOW (ref 135–145)
Total Bilirubin: 1.5 mg/dL — ABNORMAL HIGH (ref 0.0–1.2)
Total Protein: 5.9 g/dL — ABNORMAL LOW (ref 6.5–8.1)

## 2023-11-17 LAB — CBC WITH DIFFERENTIAL/PLATELET
Abs Immature Granulocytes: 0.59 K/uL — ABNORMAL HIGH (ref 0.00–0.07)
Basophils Absolute: 0 K/uL (ref 0.0–0.1)
Basophils Relative: 0 %
Eosinophils Absolute: 0 K/uL (ref 0.0–0.5)
Eosinophils Relative: 0 %
HCT: 36.2 % (ref 36.0–46.0)
Hemoglobin: 12.1 g/dL (ref 12.0–15.0)
Immature Granulocytes: 4 %
Lymphocytes Relative: 3 %
Lymphs Abs: 0.6 K/uL — ABNORMAL LOW (ref 0.7–4.0)
MCH: 30.6 pg (ref 26.0–34.0)
MCHC: 33.4 g/dL (ref 30.0–36.0)
MCV: 91.6 fL (ref 80.0–100.0)
Monocytes Absolute: 0.6 K/uL (ref 0.1–1.0)
Monocytes Relative: 4 %
Neutro Abs: 14.8 K/uL — ABNORMAL HIGH (ref 1.7–7.7)
Neutrophils Relative %: 89 %
Platelets: 187 K/uL (ref 150–400)
RBC: 3.95 MIL/uL (ref 3.87–5.11)
RDW: 15.3 % (ref 11.5–15.5)
WBC: 16.6 K/uL — ABNORMAL HIGH (ref 4.0–10.5)
nRBC: 0 % (ref 0.0–0.2)

## 2023-11-17 LAB — I-STAT CG4 LACTIC ACID, ED: Lactic Acid, Venous: 4.9 mmol/L (ref 0.5–1.9)

## 2023-11-17 LAB — RESP PANEL BY RT-PCR (RSV, FLU A&B, COVID)  RVPGX2
Influenza A by PCR: NEGATIVE
Influenza B by PCR: NEGATIVE
Resp Syncytial Virus by PCR: NEGATIVE
SARS Coronavirus 2 by RT PCR: NEGATIVE

## 2023-11-17 LAB — TROPONIN I (HIGH SENSITIVITY)
Troponin I (High Sensitivity): 7 ng/L (ref <18)
Troponin I (High Sensitivity): 7 ng/L (ref <18)

## 2023-11-17 LAB — BRAIN NATRIURETIC PEPTIDE: B Natriuretic Peptide: 679.9 pg/mL — ABNORMAL HIGH (ref 0.0–100.0)

## 2023-11-17 MED ORDER — VANCOMYCIN HCL 2000 MG/400ML IV SOLN
2000.0000 mg | Freq: Once | INTRAVENOUS | Status: AC
  Administered 2023-11-17: 2000 mg via INTRAVENOUS
  Filled 2023-11-17: qty 400

## 2023-11-17 MED ORDER — LACTATED RINGERS IV BOLUS (SEPSIS)
1000.0000 mL | Freq: Once | INTRAVENOUS | Status: AC
  Administered 2023-11-17: 1000 mL via INTRAVENOUS

## 2023-11-17 MED ORDER — SODIUM CHLORIDE 0.9 % IV SOLN
2.0000 g | Freq: Once | INTRAVENOUS | Status: AC
  Administered 2023-11-17: 2 g via INTRAVENOUS
  Filled 2023-11-17: qty 20

## 2023-11-17 MED ORDER — LACTATED RINGERS IV BOLUS
500.0000 mL | Freq: Once | INTRAVENOUS | Status: AC
  Administered 2023-11-17: 500 mL via INTRAVENOUS

## 2023-11-17 MED ORDER — LACTATED RINGERS IV BOLUS (SEPSIS)
500.0000 mL | Freq: Once | INTRAVENOUS | Status: AC
  Administered 2023-11-17: 500 mL via INTRAVENOUS

## 2023-11-17 MED ORDER — SODIUM CHLORIDE 0.9 % IV SOLN
2.0000 g | INTRAVENOUS | Status: AC
  Administered 2023-11-17: 2 g via INTRAVENOUS
  Filled 2023-11-17: qty 12.5

## 2023-11-17 MED ORDER — POTASSIUM CHLORIDE CRYS ER 20 MEQ PO TBCR
40.0000 meq | EXTENDED_RELEASE_TABLET | Freq: Once | ORAL | Status: AC
  Administered 2023-11-17: 40 meq via ORAL
  Filled 2023-11-17: qty 2

## 2023-11-17 NOTE — ED Provider Notes (Signed)
 [place] EMERGENCY DEPARTMENT AT [facility] Provider Note   CSN: [id number] Arrival date & time: 11/17/23  2007     Patient presents with: Fever and Shortness of Breath   AARADHYA Perry is a 73 y.o. female.   73 year old female with history of hypertension, hyperlipidemia, and COPD who presents emergency department with fever, weakness, and rash with concerns for sepsis.  Patient reports that she has noticed a rash on her right leg recently.  Says that she also has been feeling weak and short of breath.  Had a fever of 101F at home via oral thermometer.  Went to urgent care today and they referred her to the emergency department due to concerns for sepsis.  Says she is not having a cough.  Denies any dysuria or frequency.  Also says she has been having some left-sided flank pain where she has a kidney stone.       Prior to Admission medications   Medication Sig Start Date End Date Taking? Authorizing Provider  acetaminophen  (TYLENOL ) 500 MG tablet Take 500 mg by mouth 2 (two) times daily.   Yes [provider]  albuterol  (PROVENTIL ) (2.5 MG/3ML) 0.083% nebulizer solution USE EVERY 4 HOURS WITH IPRATROPIUM 05/24/23  Yes Byrum, Itzamar Traynor S, MD  amLODipine  (NORVASC ) 10 MG tablet Take 1 tablet by mouth once daily 04/02/23  Yes [name], MD  apixaban  (ELIQUIS ) 5 MG TABS tablet TAKE 1 TABLET BY MOUTH TWICE DAILY . APPOINTMENT REQUIRED FOR FUTURE REFILLS 11/05/23  Yes Delford Maude BROCKS, MD  budeson-glycopyrrolate -formoterol  (BREZTRI  AEROSPHERE) 160-9-4.8 MCG/ACT AERO inhaler Inhale 2 puffs into the lungs 2 (two) times daily. 08/04/23  Yes Shelah Lamar RAMAN, MD  cetirizine (ZYRTEC) 10 MG tablet Take 10 mg by mouth daily.   Yes [provider]  Cholecalciferol  50 MCG (2000 UT) TABS 1 tab by mouth once daily 02/05/22  Yes [name], MD  dimenhyDRINATE (DRAMAMINE) 50 MG tablet Take 50 mg by mouth every 8 (eight) hours as needed for nausea. 05/26/16  Yes [provider]  escitalopram  (LEXAPRO ) 20 MG tablet Take 1 tablet (20 mg total) by mouth daily. 09/06/23  Yes [name], MD  estradiol  (ESTRACE ) 1 MG tablet Take 1 mg by mouth daily. 12/18/18  Yes [provider]  Blood Glucose Monitoring Suppl (FREESTYLE LITE) DEVI Use as directed daily E11.9 09/26/18   [name], MD  ferrous sulfate  325 (65 FE) MG tablet Take 1 tablet (325 mg total) by mouth daily. 12/18/22   Horton, Charmaine FALCON, MD  fluorouracil (EFUDEX) 5 % cream Apply topically. 11/06/15   [provider]  furosemide  (LASIX ) 40 MG tablet Take 1 tablet (40 mg total) by mouth daily. 02/04/22   [name], MD  glucose blood (FREESTYLE LITE) test strip Use as instructed once daily E11.9 09/26/18   Perry, Andrea W, MD  GUAIFENESIN  ER PO Take 600 mg by mouth daily as needed for cough.    [provider]  ipratropium-albuterol  (DUONEB) 0.5-2.5 (3) MG/3ML SOLN Take 3 mLs by nebulization every 6 (six) hours as needed. 08/04/23   Shelah Lamar RAMAN, MD  irbesartan  (AVAPRO ) 150 MG tablet Take 1 tablet by mouth once daily 03/09/23   [name], MD  ketoconazole  (NIZORAL ) 2 % cream     [provider]  Lancets MISC Use as directed once daily E11.9 09/26/18   [name], MD  levothyroxine  (SYNTHROID ) 50 MCG tablet Take 1 tablet (50 mcg total)  by mouth daily. 09/06/23   [name], MD  levothyroxine  (SYNTHROID ) 50 MCG tablet Take 1 tablet (50 mcg total) by mouth daily. 09/06/23   [name], MD  LORazepam  (ATIVAN ) 1 MG tablet Take 1 tablet by mouth twice daily as needed for anxiety 09/10/23   [name], MD  Magnesium  Oxide -Mg Supplement 500 MG CAPS 1 tab by mouth twice per day Patient taking differently: Take 1 capsule by mouth in the morning. 1 tab by mouth twice per day 02/05/22   [name], MD  metFORMIN  (GLUCOPHAGE -XR) 500 MG 24 hr tablet TAKE 2 TABLETS BY MOUTH ONCE DAILY WITH BREAKFAST 03/29/23   [name], MD  metoprolol  tartrate (LOPRESSOR ) 100 MG  tablet Take 1 tablet by mouth twice daily 11/04/23   Perry, Andrea C, MD  mupirocin  ointment (BACTROBAN ) 2 % Apply 1 Application topically 2 (two) times daily. Apply to the affected area 2 times a day 02/04/23   Perry Andrea GAILS, MD  pantoprazole  (PROTONIX ) 40 MG tablet Take 1 tablet (40 mg total) by mouth daily. 09/06/23   [name], MD  potassium chloride  SA (KLOR-CON ) 20 MEQ tablet Take 1 tablet (20 mEq total) by mouth daily. 06/14/20   Perry, Laxman, MD  promethazine -dextromethorphan (PROMETHAZINE -DM) 6.25-15 MG/5ML syrup     [provider]  rosuvastatin  (CRESTOR ) 40 MG tablet Take 1 tablet (40 mg total) by mouth daily. 09/06/23   [name], MD  TETRAHYDROZOLINE HCL OP Apply to eye. 05/26/16   [provider]  tiZANidine  (ZANAFLEX ) 4 MG tablet Take 1 tablet (4 mg total) by mouth every 6 (six) hours as needed for muscle spasms. 07/18/21   [name], MD  tolterodine  (DETROL  LA) 4 MG 24 hr capsule Take 1 capsule (4 mg total) by mouth daily. 02/04/22   [name], MD  vitamin E 180 MG (400 UNITS) capsule Take by mouth. 05/26/16   [provider]    Allergies: Tramadol hcl, Codeine , Tape, and Vicodin [hydrocodone -acetaminophen ]    Review of Systems  Updated Vital Signs BP 100/62   Pulse (!) 108   Temp 98.9 F (37.2 C)   Resp 16   Ht 5' 2 (1.575 m)   Wt 120.2 kg   SpO2 100%   BMI 48.47 kg/m   Physical Exam Vitals and nursing note reviewed.  Constitutional:      General: She is not in acute distress.    Appearance: She is well-developed.  HENT:     Head: Normocephalic and atraumatic.     Right Ear: External ear normal.     Left Ear: External ear normal.     Nose: Nose normal.  Eyes:     Extraocular Movements: Extraocular movements intact.     Conjunctiva/sclera: Conjunctivae normal.     Pupils: Pupils are equal, round, and reactive to light.  Cardiovascular:     Rate and Rhythm: Tachycardia present. Rhythm irregular.     Heart sounds: No  murmur heard. Pulmonary:     Effort: Pulmonary effort is normal. No respiratory distress.     Breath sounds: Normal breath sounds.     Comments: Limited due to habitus Abdominal:     General: Abdomen is flat. There is no distension.     Palpations: Abdomen is soft. There is no mass.     Tenderness: There is no abdominal tenderness. There is no right CVA tenderness, left CVA tenderness or guarding.  Musculoskeletal:     Cervical  back: Normal range of motion and neck supple.     Right lower leg: Edema present.     Left lower leg: Edema present.  Skin:    General: Skin is warm and dry.     Findings: Rash (Lateral aspect of right leg.  See image below.) present.  Neurological:     Mental Status: She is alert and oriented to person, place, and time. Mental status is at baseline.  Psychiatric:        Mood and Affect: Mood normal.    Right lower extremity:   (all labs ordered are listed, but only abnormal results are displayed) Labs Reviewed  CBC WITH DIFFERENTIAL/PLATELET - Abnormal; Notable for the following components:      Result Value   WBC 16.6 (*)    Neutro Abs 14.8 (*)    Lymphs Abs 0.6 (*)    Abs Immature Granulocytes 0.59 (*)    All other components within normal limits  COMPREHENSIVE METABOLIC PANEL WITH GFR - Abnormal; Notable for the following components:   Sodium 132 (*)    Potassium 3.2 (*)    Chloride 97 (*)    CO2 20 (*)    Glucose, Bld 236 (*)    Creatinine, Ser 1.58 (*)    Calcium  7.5 (*)    Total Protein 5.9 (*)    Albumin 2.7 (*)    Total Bilirubin 1.5 (*)    GFR, Estimated 35 (*)    All other components within normal limits  BRAIN NATRIURETIC PEPTIDE - Abnormal; Notable for the following components:   B Natriuretic Peptide 679.9 (*)    All other components within normal limits  I-STAT CG4 LACTIC ACID, ED - Abnormal; Notable for the following components:   Lactic Acid, Venous 4.9 (*)    All other components within normal limits  RESP PANEL BY RT-PCR  (RSV, FLU A&B, COVID)  [id number]  CULTURE, BLOOD (ROUTINE X 2)  CULTURE, BLOOD (ROUTINE X 2)  URINALYSIS, W/ REFLEX TO CULTURE (INFECTION SUSPECTED)  I-STAT CG4 LACTIC ACID, ED  TROPONIN I (HIGH SENSITIVITY)  TROPONIN I (HIGH SENSITIVITY)    EKG: EKG Interpretation Date/Time:  Wednesday November 17 2023 21:08:24 EDT Ventricular Rate:  119 PR Interval:    QRS Duration:  78 QT Interval:  272 QTC Calculation: 382 R Axis:   31  Text Interpretation: Atrial fibrillation with rapid ventricular response Low voltage QRS Nonspecific T wave abnormality Abnormal ECG When compared with ECG of 18-Dec-2022 01:11, PREVIOUS ECG IS PRESENT Confirmed by Yolande Charleston [phone]) on 11/17/2023 10:01:03 PM  Radiology: CT CHEST ABDOMEN PELVIS WO CONTRAST Result Date: 11/17/2023 CLINICAL DATA:  SOB, FLANK PAIN, SEPTIC EXAM: CT CHEST, ABDOMEN AND PELVIS WITHOUT CONTRAST TECHNIQUE: Multidetector CT imaging of the chest, abdomen and pelvis was performed following the standard protocol without IV contrast. RADIATION DOSE REDUCTION: This exam was performed according to the departmental dose-optimization program which includes automated exposure control, adjustment of the mA and/or kV according to patient size and/or use of iterative reconstruction technique. COMPARISON:  07/01/2023 FINDINGS: CT CHEST FINDINGS Cardiovascular: Heart is normal size. Aorta is normal caliber. Three vessel coronary artery disease and moderate aortic atherosclerosis. Mediastinum/Nodes: No mediastinal, hilar, or axillary adenopathy. Trachea and esophagus are unremarkable. Thyroid  unremarkable. Lungs/Pleura: Lungs are clear. No focal airspace opacities or suspicious nodules. No effusions. Musculoskeletal: Chest wall soft tissues are unremarkable. No acute bony abnormality. CT ABDOMEN PELVIS FINDINGS Hepatobiliary: No focal hepatic abnormality. Gallbladder unremarkable. Pancreas: No focal abnormality or ductal dilatation. Spleen: No  focal abnormality.   Normal size. Adrenals/Urinary Tract: Adrenal glands normal. 11 mm right renal pelvic stone. No hydronephrosis. Mild perinephric stranding. Findings are similar to prior study. No stones or hydronephrosis on the left. Urinary bladder unremarkable. Stomach/Bowel: Stomach, large and small bowel grossly unremarkable. Vascular/Lymphatic: Aortic atherosclerosis. No evidence of aneurysm or adenopathy. Reproductive: Prior hysterectomy.  No adnexal masses. Other: No free fluid or free air. Musculoskeletal: No acute bony abnormality. IMPRESSION: No acute cardiopulmonary disease. Three-vessel coronary artery disease, aortic atherosclerosis. 11 mm right renal pelvic stone without hydronephrosis. Perinephric stranding noted. Findings are stable since prior study. Aortic atherosclerosis. No acute findings in the abdomen or pelvis. Electronically Signed   By: Franky Crease M.D.   On: 11/17/2023 23:35   DG Chest 2 View Result Date: 11/17/2023 CLINICAL DATA:  Shortness of breath. EXAM: CHEST - 2 VIEW COMPARISON:  12/17/2022. FINDINGS: The heart is borderline enlarged and mediastinal contours are within normal limits. There is atherosclerotic calcification of the aorta. No consolidation, effusion, or pneumothorax is seen. Degenerative changes are present in the thoracic spine. No acute osseous abnormality. IMPRESSION: No active cardiopulmonary disease. Electronically Signed   By: Leita Birmingham M.D.   On: 11/17/2023 20:47     Procedures   Medications Ordered in the ED  vancomycin  (VANCOREADY) IVPB 2000 mg/400 mL (2,000 mg Intravenous New Bag/Given 11/17/23 2308)  cefTRIAXone  (ROCEPHIN ) 2 g in sodium chloride  0.9 % 100 mL IVPB (0 g Intravenous Stopped 11/17/23 2228)  lactated ringers  bolus 500 mL (0 mLs Intravenous Stopped 11/17/23 2308)  lactated ringers  bolus 1,000 mL (1,000 mLs Intravenous New Bag/Given 11/17/23 2305)    And  lactated ringers  bolus 500 mL (0 mLs Intravenous Stopped 11/17/23 2328)  ceFEPIme  (MAXIPIME ) 2 g  in sodium chloride  0.9 % 100 mL IVPB (2 g Intravenous New Bag/Given 11/17/23 2306)  potassium chloride  SA (KLOR-CON  M) CR tablet 40 mEq (40 mEq Oral Given 11/17/23 2305)    Clinical Course as of 11/17/23 2342  Wed Nov 17, 2023  2159 Creatinine(!): 1.58 Baseline 0.8 [RP]  2329 Signed out to Dr Haze [RP]    Clinical Course User Index [RP] Yolande Lamar BROCKS, MD                                 Medical Decision Making Amount and/or Complexity of Data Reviewed Labs:  Decision-making details documented in ED Course. Radiology: ordered.  Risk Prescription drug management. Decision regarding hospitalization.   73 year old female with history of hypertension, hyperlipidemia, and COPD who presents emergency department with fever, weakness, and rash with concerns for sepsis.   Initial Ddx:  Cellulitis, pneumonia, kidney stone, UTI, pyelonephritis, sepsis, septic shock  MDM/Course:  Patient presents emergency department with generalized weakness, fever, rash, and shortness of breath.  Does have a cough as well.  Was referred in from urgent care because they were concerned about sepsis.  On exam she is in A-fib with mildly elevated heart rate of 108.  She is afebrile.  Blood pressure is WNL.  She does appear to have cellulitis on her leg.  Her lung exam is somewhat limited due to habitus but do not hear any abnormal lung sounds or obvious wheezing.  She had blood work that was obtained that shows that she has a leukocytosis of 16.6 with an AKI.  She was given ceftriaxone  due to concerns for either urinary source or cellulitis but unfortunately her lactic acid returned at 4.9  so she was changed to cefepime  and vancomycin .  She was given the 30 mL/kg bolus based on her ideal body weight since she is obese.  COVID and flu negative.  Chest x-ray without pneumonia.  With her flank pain did order a CT of the abdomen pelvis to rule out a kidney stone and added CT of the chest as well to rule out any  occult pneumonia with her shortness of breath and cough.  Signed out to the oncoming physician awaiting imaging results  This patient presents to the ED for concern of complaints listed in HPI, this involves an extensive number of treatment options, and is a complaint that carries with it a high risk of complications and morbidity. Disposition including potential need for admission considered.   Dispo: admit  Additional history obtained from daughter Records reviewed Outpatient Clinic Notes The following labs were independently interpreted: Chemistry and show AKI I independently reviewed the following imaging with scope of interpretation limited to determining acute life threatening conditions related to emergency care: Chest x-ray and agree with the radiologist interpretation with the following exceptions: none I personally reviewed and interpreted cardiac monitoring: atrial fibrillation (normal rate) and atrial fibrillation with RVR I personally reviewed and interpreted the pt's EKG: see above for interpretation  I have reviewed the patients home medications and made adjustments as needed Social Determinants of health:  Geriatric  Portions of this note were generated with Scientist, clinical (histocompatibility and immunogenetics). Dictation errors may occur despite best attempts at proofreading.    CRITICAL CARE Performed by: Lamar JAYSON Shan   Total critical care time: 30 minutes  Critical care time was exclusive of separately billable procedures and treating other patients.  Critical care was necessary to treat or prevent imminent or life-threatening deterioration.  Critical care was time spent personally by me on the following activities: development of treatment plan with patient and/or surrogate as well as nursing, discussions with consultants, evaluation of patient's response to treatment, examination of patient, obtaining history from patient or surrogate, ordering and performing treatments and interventions,  ordering and review of laboratory studies, ordering and review of radiographic studies, pulse oximetry and re-evaluation of patient's condition.   Final diagnoses:  Septic shock (HCC)  Cellulitis, unspecified cellulitis site    ED Discharge Orders     None          Shan Lamar JAYSON, MD 11/17/23 2342

## 2023-11-17 NOTE — ED Provider Triage Note (Signed)
 Emergency Medicine Provider Triage Evaluation Note  Andrea Perry , a 73 y.o. female  was evaluated in triage.  Pt complains of a red rash to her right lower leg that she noticed yesterday.  Reports fever at home, but no fever currently.  Reports that today she feels little bit more short of breath and nauseous.  Denies any chest pain.  Review of Systems  Positive: As above Negative: As above  Physical Exam  BP (!) 109/54 (BP Location: Right Wrist)   Pulse 94   Temp 98.9 F (37.2 C)   Resp (!) 24   Ht 5' 2 (1.575 m)   Wt 120.2 kg   SpO2 100%   BMI 48.47 kg/m  Gen:   Awake, no distress   Resp:  Normal effort  MSK:   Moves extremities without difficulty  Other:  Red rash to the right lower extremity  Medical Decision Making  Medically screening exam initiated at 8:31 PM.  Appropriate orders placed.  Andrea Perry was informed that the remainder of the evaluation will be completed by another provider, this initial triage assessment does not replace that evaluation, and the importance of remaining in the ED until their evaluation is complete.  Patient afebrile, not tachycardic, no obvious signs of infection, does not meet sepsis criteria at this time.  Will start with basic lab work and chest x-ray and troponins for her shortness of breath.   Veta Palma, PA-C 11/17/23 2032

## 2023-11-17 NOTE — ED Triage Notes (Signed)
 Pt present with a rash to the rt leg x yesterday. Pt denies itching, burning and pain. States it is hot to the touch. Pts niece states she has had a fever today and has applied cold compresses to body.

## 2023-11-17 NOTE — Progress Notes (Signed)
 ED Pharmacy Antibiotic Sign Off An antibiotic consult was received from an ED provider for vancomycin  per pharmacy dosing for cellulitis. A chart review was completed to assess appropriateness.   The following one time order(s) were placed:  Vancomycin  2g IV once   Further antibiotic and/or antibiotic pharmacy consults should be ordered by the admitting provider if indicated.   Thank you for allowing pharmacy to participate in this patient's care,  Suzen Sour, PharmD, BCCCP Clinical Pharmacist  Phone: [phone] 11/17/2023 10:35 PM  Please check AMION for all [facility] phone numbers After 10:00 PM, call Main Pharmacy [phone]

## 2023-11-17 NOTE — ED Triage Notes (Addendum)
 Pt sent from UC for sepsis workup. She has rash on her legs and is SOB, although, she says this is baseline. Endorses nausea.

## 2023-11-17 NOTE — ED Triage Notes (Signed)
 Pt states she has been feeling nauseas

## 2023-11-17 NOTE — ED Notes (Signed)
 Patient is being discharged from the Urgent Care and sent to the Emergency Department via POV . Per [place]  Dreama NP, patient is in need of higher level of care due to hypotension. Patient is aware and verbalizes understanding of plan of care.  Vitals:   11/17/23 1928 11/17/23 1933  BP: (!) 92/59 (!) 89/60  Pulse:    Resp:    Temp:    SpO2:

## 2023-11-17 NOTE — ED Provider Notes (Signed)
 [facility]    CSN: [id number] Arrival date & time: 11/17/23  1859      History   Chief Complaint Chief Complaint  Patient presents with   Rash    HPI DECLYN OFFIELD is a 73 y.o. female.   Patient brought into clinic over concerns of a rash to the right lower leg that is since had a splotchy spreading to her thigh.  She has not felt good today, had some nausea and dizziness.  Unsure how the rash started, denies any injuries.  It is not itchy or painful.  She had a temperature of 101 prior to arrival, caregiver placed cold rags on her.  Her AC has been broken so she only has bands at home and it is quite hot.  Does have a history of asthma and COPD.  Reports baseline shortness of breath but it has been worse than usual.  Did do a breathing treatment prior to arrival.  The history is provided by the patient and medical records.  Rash   Past Medical History:  Diagnosis Date   ALLERGIC RHINITIS 08/16/2008   ANXIETY 08/16/2008   Arthritis    hands, knees, lower back   ASTHMA 08/16/2008   Asthma    BRONCHITIS     [name]     Bladder leak    Cancer (HCC)    MELANOMA       Chronic lower back pain    problems with disc L2-5   COPD 08/16/2008   DEPRESSION 08/16/2008   Diabetes mellitus without complication (HCC)    Dysrhythmia    hx AF   Excessive daytime sleepiness 06/19/2015   GENITAL HERPES 12/03/2006   GERD (gastroesophageal reflux disease)    H/O hiatal hernia    HEMATOCHEZIA 06/20/2009   Hemorrhoids    History of cardioversion 10/30/14   History of kidney stones    HYPERLIPIDEMIA 08/16/2008   HYPERTENSION 12/03/2006   Hypertension    Impaired glucose tolerance 09/21/2010   Overactive bladder 10/20/2015   Pneumonia    as a child   Pulmonary HTN (HCC) 06/19/2015   Shortness of breath dyspnea    Sleep apnea    MILD NO CPAP ORDERED   Snoring 06/19/2015    Patient Active Problem List   Diagnosis Date Noted   Urinary incontinence 04/15/2023   Anemia 04/15/2023    Iron deficiency anemia 04/13/2023   CKD stage 3a, GFR 45-59 ml/min (HCC) 08/06/2022   Abnormal urine 08/06/2022   Urinary tract infectious disease 08/05/2022   Exposure to COVID-19 virus 07/26/2022   COPD exacerbation (HCC) 04/22/2022   Acute respiratory failure with hypoxemia (HCC) 04/21/2022   RSV (respiratory syncytial virus pneumonia) 04/21/2022   Anxiety and depression 04/21/2022   Acute respiratory failure (HCC) 04/21/2022   Dysuria 01/12/2021   Vitamin D  deficiency 07/15/2020   Low grade squamous intraepithelial lesion (LGSIL) on cervicovaginal cytologic smear 07/09/2020   Menopausal syndrome 07/09/2020   Stress incontinence of urine 07/09/2020   Muscle cramps 06/18/2020   Hypomagnesemia 06/11/2020   Physical deconditioning 06/03/2020   COVID-19 05/21/2020   CHF (congestive heart failure) (HCC) 05/21/2020   Suspected COVID-19 virus infection 05/09/2020   Complex care coordination 02/27/2019   Healthcare maintenance 02/09/2019   Medication management 12/01/2018   Shortness of breath 12/01/2018   Former smoker 12/01/2018   Atrial fibrillation, rapid (HCC) 04/29/2018   Acute gastroenteritis 03/23/2018   GERD (gastroesophageal reflux disease) 01/03/2018   Proteinuria 09/15/2017   Insomnia 03/17/2017  Rash 11/04/2016   Hematoma 11/04/2016   Pre-operative examination for internal medicine 09/11/2016   Back wound, unspecified laterality, initial encounter 05/26/2016   S/P lumbar laminectomy 03/19/2016   Overactive bladder 10/20/2015   Pulmonary HTN (HCC) 06/19/2015   Mild obstructive sleep apnea 06/19/2015   Snoring 06/19/2015   Abdominal pain 04/18/2015   Cough 03/08/2015   A-fib (HCC) 02/05/2014   Preop cardiovascular exam 02/05/2014   Arthritis of knee 02/05/2014   Arm pain, right 08/31/2013   Low back pain 09/26/2011   Leg pain 07/07/2011   Dizziness 07/07/2011   Menopausal disorder 07/07/2011   COPD with acute exacerbation (HCC) 02/25/2011   Hemorrhage of  rectum and anus 11/04/2010   Internal thrombosed hemorrhoids 11/04/2010   Internal hemorrhoids with other complication 11/04/2010   External hemorrhoids 11/04/2010   Dyspepsia 09/24/2010   Peripheral edema 09/24/2010   Diabetes (HCC) 09/21/2010   Encounter for well adult exam with abnormal findings 09/21/2010   IBS 06/20/2009   HEMATOCHEZIA 06/20/2009   Elevated lipids 08/16/2008   Anxiety state 08/16/2008   Depression 08/16/2008   Allergic rhinitis 08/16/2008   Asthma 08/16/2008   COPD (chronic obstructive pulmonary disease) (HCC) 08/16/2008   GENITAL HERPES 12/03/2006   Hypothyroidism 12/03/2006   Essential hypertension 12/03/2006    Past Surgical History:  Procedure Laterality Date   ABDOMINAL HYSTERECTOMY     APPENDECTOMY     CARDIOVERSION N/A 03/20/2014   Procedure: CARDIOVERSION;  Surgeon: Maude JAYSON Emmer, MD;  Location: [facility] ENDOSCOPY;  Service: Cardiovascular;  Laterality: N/A;   CARDIOVERSION N/A 10/30/2014   Procedure: CARDIOVERSION;  Surgeon: Maude JAYSON Emmer, MD;  Location: [facility] ENDOSCOPY;  Service: Cardiovascular;  Laterality: N/A;   CARDIOVERSION N/A 04/03/2019   Procedure: CARDIOVERSION;  Surgeon: Maranda Leim DEL, MD;  Location: [facility] ENDOSCOPY;  Service: Cardiovascular;  Laterality: N/A;   CARPAL TUNNEL RELEASE     Right + LEFT   CESAREAN SECTION     x 1    COLONOSCOPY  2004   CYST EXCISION     RT ARM    CYSTOCELE REPAIR N/A 10/25/2012   Procedure: ANTERIOR REPAIR (CYSTOCELE);  Surgeon: Peggye Gull, MD;  Location: [facility] ORS;  Service: Gynecology;  Laterality: N/A;   HEEL SPUR SURGERY Bilateral    KNEE SURGERY     Right + LEFT   LUMBAR LAMINECTOMY/DECOMPRESSION MICRODISCECTOMY N/A 03/19/2016   Procedure: Laminectomy and Foraminotomy - Lumbar three-four - Lumbar four-five;  Surgeon: Alm GORMAN Molt, MD;  Location: [facility] OR;  Service: Neurosurgery;  Laterality: N/A;   RADIOLOGY WITH ANESTHESIA Right 11/01/2014   Procedure: MRI RIGHT FOREARM;  Surgeon: Medication Radiologist, MD;   Location: [facility] OR;  Service: Radiology;  Laterality: Right;   RADIOLOGY WITH ANESTHESIA Right 08/29/2015   Procedure: MRI - RIGHT FOREARM;  Surgeon: Medication Radiologist, MD;  Location: [facility] OR;  Service: Radiology;  Laterality: Right;   RADIOLOGY WITH ANESTHESIA N/A 12/05/2015   Procedure: MRI SPINE WITHOUT;  Surgeon: Medication Radiologist, MD;  Location: [facility] OR;  Service: Radiology;  Laterality: N/A;   SKIN CANCER EXCISION     BILAT SHOULDERS   SPLIT NIGHT STUDY  09/04/2015   TONSILLECTOMY AND ADENOIDECTOMY      OB History   No obstetric history on file.      Home Medications    Prior to Admission medications   Medication Sig Start Date End Date Taking? Authorizing Provider  acetaminophen  (TYLENOL ) 500 MG tablet Take 500 mg by mouth 2 (two) times daily.    [provider]  albuterol  (PROVENTIL ) (2.5 MG/3ML) 0.083% nebulizer solution USE EVERY 4 HOURS WITH IPRATROPIUM 05/24/23   Shelah Lamar RAMAN, MD  amLODipine  (NORVASC ) 10 MG tablet Take 1 tablet by mouth once daily 04/02/23   [name], MD  amoxicillin -clavulanate (AUGMENTIN ) 875-125 MG tablet Take 1 tablet by mouth every 12 (twelve) hours. 01/28/23   Griselda Norris, MD  apixaban  (ELIQUIS ) 5 MG TABS tablet TAKE 1 TABLET BY MOUTH TWICE DAILY . APPOINTMENT REQUIRED FOR FUTURE REFILLS 11/05/23   Nishan, Peter C, MD  Blood Glucose Monitoring Suppl (FREESTYLE LITE) DEVI Use as directed daily E11.9 09/26/18   [name], MD  budeson-glycopyrrolate -formoterol  (BREZTRI  AEROSPHERE) 160-9-4.8 MCG/ACT AERO inhaler Inhale 2 puffs into the lungs 2 (two) times daily. 08/04/23   Shelah Lamar RAMAN, MD  budeson-glycopyrrolate -formoterol  (BREZTRI  AEROSPHERE) 160-9-4.8 MCG/ACT AERO inhaler 2 samples provided 08/04/23   Byrum, Robert S, MD  cetirizine (ZYRTEC) 10 MG tablet Take 10 mg by mouth daily.    [provider]  Cholecalciferol  50 MCG (2000 UT) TABS 1 tab by mouth once daily 02/05/22   [name], MD  cyclobenzaprine  (FLEXERIL ) 5  MG tablet     [provider]  dimenhyDRINATE (DRAMAMINE) 50 MG tablet Take by mouth every 8 (eight) hours as needed. 05/26/16   [provider]  escitalopram  (LEXAPRO ) 20 MG tablet Take 1 tablet (20 mg total) by mouth at bedtime. 09/06/23   [name], MD  escitalopram  (LEXAPRO ) 20 MG tablet Take 1 tablet (20 mg total) by mouth daily. 09/06/23   [name], MD  estradiol  (ESTRACE ) 1 MG tablet Take 1 mg by mouth daily. 12/18/18   [provider]  ferrous sulfate  325 (65 FE) MG tablet Take 1 tablet (325 mg total) by mouth daily. 12/18/22   Horton, Charmaine FALCON, MD  fluorouracil (EFUDEX) 5 % cream Apply topically. 11/06/15   [provider]  furosemide  (LASIX ) 40 MG tablet Take 1 tablet (40 mg total) by mouth daily. 02/04/22   [name], MD  glucose blood (FREESTYLE LITE) test strip Use as instructed once daily E11.9 09/26/18   John, James W, MD  GUAIFENESIN  ER PO Take 600 mg by mouth daily as needed for cough.    [provider]  ipratropium-albuterol  (DUONEB) 0.5-2.5 (3) MG/3ML SOLN Take 3 mLs by nebulization every 6 (six) hours as needed. 08/04/23   Shelah Lamar RAMAN, MD  irbesartan  (AVAPRO ) 150 MG tablet Take 1 tablet by mouth once daily 03/09/23   [name], MD  ketoconazole  (NIZORAL ) 2 % cream     [provider]  Lancets MISC Use as directed once daily E11.9 09/26/18   [name], MD  levothyroxine  (SYNTHROID ) 50 MCG tablet Take 1 tablet (50 mcg total) by mouth daily. 09/06/23   [name], MD  levothyroxine  (SYNTHROID ) 50 MCG tablet Take 1 tablet (50 mcg total) by mouth daily. 09/06/23   [name], MD  LORazepam  (ATIVAN ) 1 MG tablet Take 1 tablet by mouth twice daily as needed for anxiety 09/10/23   [name], MD  Magnesium  Oxide -Mg Supplement 500 MG CAPS 1 tab by mouth twice per day Patient taking differently: Take 1 capsule by mouth in the morning. 1 tab by mouth twice per day 02/05/22   [name], MD  metFORMIN   (GLUCOPHAGE -XR) 500 MG 24 hr tablet TAKE 2 TABLETS BY MOUTH ONCE DAILY WITH BREAKFAST 03/29/23   [name], MD  metoprolol  tartrate (  LOPRESSOR ) 100 MG tablet Take 1 tablet by mouth twice daily 11/04/23   Nishan, Peter C, MD  mupirocin  ointment (BACTROBAN ) 2 % Apply 1 Application topically 2 (two) times daily. Apply to the affected area 2 times a day 02/04/23   Harden Jerona GAILS, MD  pantoprazole  (PROTONIX ) 40 MG tablet Take 1 tablet (40 mg total) by mouth daily. 09/06/23   [name], MD  potassium chloride  SA (KLOR-CON ) 20 MEQ tablet Take 1 tablet (20 mEq total) by mouth daily. 06/14/20   Pokhrel, Laxman, MD  promethazine -dextromethorphan (PROMETHAZINE -DM) 6.25-15 MG/5ML syrup     [provider]  rosuvastatin  (CRESTOR ) 40 MG tablet Take 1 tablet (40 mg total) by mouth daily. 09/06/23   [name], MD  TETRAHYDROZOLINE HCL OP Apply to eye. 05/26/16   [provider]  tiZANidine  (ZANAFLEX ) 4 MG tablet Take 1 tablet (4 mg total) by mouth every 6 (six) hours as needed for muscle spasms. 07/18/21   [name], MD  tolterodine  (DETROL  LA) 4 MG 24 hr capsule Take 1 capsule (4 mg total) by mouth daily. 02/04/22   [name], MD  vitamin E 180 MG (400 UNITS) capsule Take by mouth. 05/26/16   [provider]    Family History Family History  Problem Relation Age of Onset   Diabetes Mother    Hyperlipidemia Mother    Hypertension Mother    Colon polyps Mother    Stroke Father    Colon cancer Neg Hx     Social History Social History   Tobacco Use   Smoking status: Former    Current packs/day: 0.00    Average packs/day: 1.5 packs/day for 30.0 years (45.0 ttl pk-yrs)    Types: Cigarettes    Start date: 04/20/1973    Quit date: 04/21/2003    Years since quitting: 20.5   Smokeless tobacco: Never  Vaping Use   Vaping status: Never Used  Substance Use Topics   Alcohol use: Yes    Alcohol/week: 2.0 standard drinks of alcohol    Types: 2 Standard drinks or equivalent  per week    Comment: OCCAS   Drug use: No     Allergies   Tramadol hcl, Other, Codeine , Tape, and Vicodin [hydrocodone -acetaminophen ]   Review of Systems Review of Systems  Per HPI  Physical Exam Triage Vital Signs ED Triage Vitals  Encounter Vitals Group     BP 11/17/23 1928 (!) 92/59     Girls Systolic BP Percentile --      Girls Diastolic BP Percentile --      Boys Systolic BP Percentile --      Boys Diastolic BP Percentile --      Pulse Rate 11/17/23 1927 (!) 103     Resp 11/17/23 1927 15     Temp 11/17/23 1927 100.1 F (37.8 C)     Temp Source 11/17/23 1927 Oral     SpO2 11/17/23 1927 98 %     Weight --      Height --      Head Circumference --      Peak Flow --      Pain Score 11/17/23 1926 0     Pain Loc --      Pain Education --      Exclude from Growth Chart --    No data found.  Updated Vital Signs BP (!) 89/60 (BP Location: Left Arm)   Pulse (!) 103   Temp 100.1 F (37.8 C) (  Oral)   Resp 15   SpO2 98%   Visual Acuity Right Eye Distance:   Left Eye Distance:   Bilateral Distance:    Right Eye Near:   Left Eye Near:    Bilateral Near:     Physical Exam Vitals and nursing note reviewed.  Constitutional:      Appearance: Normal appearance.  HENT:     Head: Normocephalic and atraumatic.     Right Ear: External ear normal.     Left Ear: External ear normal.     Nose: Nose normal.     Mouth/Throat:     Mouth: Mucous membranes are moist.  Eyes:     Conjunctiva/sclera: Conjunctivae normal.  Cardiovascular:     Rate and Rhythm: Tachycardia present. Rhythm irregular.  Pulmonary:     Effort: Pulmonary effort is normal. No respiratory distress.  Musculoskeletal:        General: Normal range of motion.  Skin:    General: Skin is warm and dry.     Findings: Rash present.      Neurological:     General: No focal deficit present.     Mental Status: She is alert and oriented to person, place, and time.  Psychiatric:        Mood and Affect:  Mood normal.        Behavior: Behavior normal. Behavior is cooperative.      UC Treatments / Results  Labs (all labs ordered are listed, but only abnormal results are displayed) Labs Reviewed - No data to display  EKG   Radiology No results found.  Procedures Procedures (including critical care time)  Medications Ordered in UC Medications - No data to display  Initial Impression / Assessment and Plan / UC Course  I have reviewed the triage vital signs and the nursing notes.  Pertinent labs & imaging results that were available during my care of the patient were reviewed by me and considered in my medical decision making (see chart for details).  Vitals in triage reviewed, patient is hypertensive.  Blood pressure checked 4 times, remains hypotensive.  She is tachycardic, irregular rhythm with a history of A-fib.  Febrile at home, did drink soda prior to arrival and temp 100.1.  New rash that is not painful, low concern for shingles.  With presentation, concerning for sepsis.  Advise further evaluation at the nearest emergency department.  Caregiver will transport via POV.    Final Clinical Impressions(s) / UC Diagnoses   Final diagnoses:  None   Discharge Instructions   None    ED Prescriptions   None    PDMP not reviewed this encounter.   Dreama, Elliott Lasecki  N, FNP 11/17/23 1956

## 2023-11-17 NOTE — Sepsis Progress Note (Addendum)
Elink monitoring for the code sepsis protocol.  Notified provider of need to order fluid bolus.

## 2023-11-18 ENCOUNTER — Inpatient Hospital Stay (HOSPITAL_COMMUNITY)

## 2023-11-18 ENCOUNTER — Encounter (HOSPITAL_COMMUNITY): Payer: Self-pay | Admitting: Internal Medicine

## 2023-11-18 DIAGNOSIS — A419 Sepsis, unspecified organism: Secondary | ICD-10-CM | POA: Diagnosis present

## 2023-11-18 DIAGNOSIS — E038 Other specified hypothyroidism: Secondary | ICD-10-CM

## 2023-11-18 DIAGNOSIS — R7989 Other specified abnormal findings of blood chemistry: Secondary | ICD-10-CM

## 2023-11-18 DIAGNOSIS — I5032 Chronic diastolic (congestive) heart failure: Secondary | ICD-10-CM | POA: Diagnosis present

## 2023-11-18 DIAGNOSIS — Z6841 Body Mass Index (BMI) 40.0 and over, adult: Secondary | ICD-10-CM | POA: Diagnosis not present

## 2023-11-18 DIAGNOSIS — Z794 Long term (current) use of insulin: Secondary | ICD-10-CM | POA: Diagnosis not present

## 2023-11-18 DIAGNOSIS — I4821 Permanent atrial fibrillation: Secondary | ICD-10-CM | POA: Diagnosis present

## 2023-11-18 DIAGNOSIS — E871 Hypo-osmolality and hyponatremia: Secondary | ICD-10-CM | POA: Diagnosis present

## 2023-11-18 DIAGNOSIS — N3281 Overactive bladder: Secondary | ICD-10-CM

## 2023-11-18 DIAGNOSIS — I130 Hypertensive heart and chronic kidney disease with heart failure and stage 1 through stage 4 chronic kidney disease, or unspecified chronic kidney disease: Secondary | ICD-10-CM | POA: Diagnosis present

## 2023-11-18 DIAGNOSIS — Z1152 Encounter for screening for COVID-19: Secondary | ICD-10-CM | POA: Diagnosis not present

## 2023-11-18 DIAGNOSIS — I700 Atherosclerosis of aorta: Secondary | ICD-10-CM | POA: Diagnosis present

## 2023-11-18 DIAGNOSIS — E039 Hypothyroidism, unspecified: Secondary | ICD-10-CM | POA: Diagnosis present

## 2023-11-18 DIAGNOSIS — N179 Acute kidney failure, unspecified: Secondary | ICD-10-CM | POA: Diagnosis not present

## 2023-11-18 DIAGNOSIS — E1122 Type 2 diabetes mellitus with diabetic chronic kidney disease: Secondary | ICD-10-CM | POA: Diagnosis present

## 2023-11-18 DIAGNOSIS — E66813 Obesity, class 3: Secondary | ICD-10-CM | POA: Diagnosis present

## 2023-11-18 DIAGNOSIS — E876 Hypokalemia: Secondary | ICD-10-CM

## 2023-11-18 DIAGNOSIS — J439 Emphysema, unspecified: Secondary | ICD-10-CM | POA: Diagnosis present

## 2023-11-18 DIAGNOSIS — F319 Bipolar disorder, unspecified: Secondary | ICD-10-CM | POA: Diagnosis present

## 2023-11-18 DIAGNOSIS — I10 Essential (primary) hypertension: Secondary | ICD-10-CM

## 2023-11-18 DIAGNOSIS — B354 Tinea corporis: Secondary | ICD-10-CM

## 2023-11-18 DIAGNOSIS — Z87442 Personal history of urinary calculi: Secondary | ICD-10-CM | POA: Insufficient documentation

## 2023-11-18 DIAGNOSIS — Z8709 Personal history of other diseases of the respiratory system: Secondary | ICD-10-CM | POA: Insufficient documentation

## 2023-11-18 DIAGNOSIS — N1831 Chronic kidney disease, stage 3a: Secondary | ICD-10-CM | POA: Diagnosis present

## 2023-11-18 DIAGNOSIS — R0609 Other forms of dyspnea: Secondary | ICD-10-CM

## 2023-11-18 DIAGNOSIS — L0390 Cellulitis, unspecified: Secondary | ICD-10-CM

## 2023-11-18 DIAGNOSIS — L03115 Cellulitis of right lower limb: Secondary | ICD-10-CM | POA: Diagnosis present

## 2023-11-18 DIAGNOSIS — I2510 Atherosclerotic heart disease of native coronary artery without angina pectoris: Secondary | ICD-10-CM | POA: Diagnosis present

## 2023-11-18 DIAGNOSIS — N170 Acute kidney failure with tubular necrosis: Secondary | ICD-10-CM | POA: Diagnosis present

## 2023-11-18 DIAGNOSIS — I4819 Other persistent atrial fibrillation: Secondary | ICD-10-CM

## 2023-11-18 DIAGNOSIS — E785 Hyperlipidemia, unspecified: Secondary | ICD-10-CM | POA: Diagnosis present

## 2023-11-18 DIAGNOSIS — E7849 Other hyperlipidemia: Secondary | ICD-10-CM

## 2023-11-18 DIAGNOSIS — E119 Type 2 diabetes mellitus without complications: Secondary | ICD-10-CM

## 2023-11-18 DIAGNOSIS — F411 Generalized anxiety disorder: Secondary | ICD-10-CM

## 2023-11-18 DIAGNOSIS — I272 Pulmonary hypertension, unspecified: Secondary | ICD-10-CM | POA: Diagnosis present

## 2023-11-18 LAB — LACTIC ACID, PLASMA
Lactic Acid, Venous: 2.2 mmol/L (ref 0.5–1.9)
Lactic Acid, Venous: 2 mmol/L (ref 0.5–1.9)

## 2023-11-18 LAB — GLUCOSE, CAPILLARY
Glucose-Capillary: 160 mg/dL — ABNORMAL HIGH (ref 70–99)
Glucose-Capillary: 137 mg/dL — ABNORMAL HIGH (ref 70–99)

## 2023-11-18 LAB — BASIC METABOLIC PANEL WITH GFR
Anion gap: 7 (ref 5–15)
BUN: 13 mg/dL (ref 8–23)
CO2: 25 mmol/L (ref 22–32)
Calcium: 7.2 mg/dL — ABNORMAL LOW (ref 8.9–10.3)
Chloride: 103 mmol/L (ref 98–111)
Creatinine, Ser: 1.17 mg/dL — ABNORMAL HIGH (ref 0.44–1.00)
GFR, Estimated: 50 mL/min — ABNORMAL LOW (ref >60)
Glucose, Bld: 182 mg/dL — ABNORMAL HIGH (ref 70–99)
Potassium: 3.3 mmol/L — ABNORMAL LOW (ref 3.5–5.1)
Sodium: 135 mmol/L (ref 135–145)

## 2023-11-18 LAB — HEMOGLOBIN A1C
Hgb A1c MFr Bld: 6 % — ABNORMAL HIGH (ref 4.8–5.6)
Mean Plasma Glucose: 125.5 mg/dL

## 2023-11-18 LAB — ECHOCARDIOGRAM COMPLETE
Area-P 1/2: 4.15 cm2
Calc EF: 61.9 %
Height: 62 in
S' Lateral: 3.3 cm
Single Plane A2C EF: 66.6 %
Single Plane A4C EF: 59.9 %
Weight: 4240 [oz_av]

## 2023-11-18 LAB — HIV ANTIBODY (ROUTINE TESTING W REFLEX): HIV Screen 4th Generation wRfx: NONREACTIVE

## 2023-11-18 LAB — CBC
HCT: 30.3 % — ABNORMAL LOW (ref 36.0–46.0)
Hemoglobin: 10.1 g/dL — ABNORMAL LOW (ref 12.0–15.0)
MCH: 30.8 pg (ref 26.0–34.0)
MCHC: 33.3 g/dL (ref 30.0–36.0)
MCV: 92.4 fL (ref 80.0–100.0)
Platelets: 171 K/uL (ref 150–400)
RBC: 3.28 MIL/uL — ABNORMAL LOW (ref 3.87–5.11)
RDW: 15.7 % — ABNORMAL HIGH (ref 11.5–15.5)
WBC: 15.1 K/uL — ABNORMAL HIGH (ref 4.0–10.5)
nRBC: 0 % (ref 0.0–0.2)

## 2023-11-18 LAB — CBG MONITORING, ED
Glucose-Capillary: 147 mg/dL — ABNORMAL HIGH (ref 70–99)
Glucose-Capillary: 121 mg/dL — ABNORMAL HIGH (ref 70–99)

## 2023-11-18 LAB — I-STAT CG4 LACTIC ACID, ED: Lactic Acid, Venous: 4.8 mmol/L (ref 0.5–1.9)

## 2023-11-18 MED ORDER — SODIUM CHLORIDE 0.9 % IV SOLN
2.0000 g | Freq: Two times a day (BID) | INTRAVENOUS | Status: DC
  Administered 2023-11-18 – 2023-11-19 (×3): 2 g via INTRAVENOUS
  Filled 2023-11-18 (×3): qty 12.5

## 2023-11-18 MED ORDER — ONDANSETRON HCL 4 MG PO TABS: 4.0000 mg | ORAL_TABLET | Freq: Four times a day (QID) | ORAL | Status: DC | PRN

## 2023-11-18 MED ORDER — LACTATED RINGERS IV SOLN: INTRAVENOUS | Status: DC

## 2023-11-18 MED ORDER — SODIUM CHLORIDE 0.9% FLUSH
3.0000 mL | Freq: Two times a day (BID) | INTRAVENOUS | Status: DC
  Administered 2023-11-19 – 2023-11-21 (×4): 3 mL via INTRAVENOUS

## 2023-11-18 MED ORDER — ACETAMINOPHEN 650 MG RE SUPP: 650.0000 mg | Freq: Four times a day (QID) | RECTAL | Status: DC | PRN

## 2023-11-18 MED ORDER — BUDESON-GLYCOPYRROL-FORMOTEROL 160-9-4.8 MCG/ACT IN AERO
2.0000 | INHALATION_SPRAY | Freq: Two times a day (BID) | RESPIRATORY_TRACT | Status: DC
  Administered 2023-11-18 – 2023-11-21 (×7): 2 via RESPIRATORY_TRACT
  Filled 2023-11-18 (×2): qty 5.9

## 2023-11-18 MED ORDER — PANTOPRAZOLE SODIUM 40 MG PO TBEC
40.0000 mg | DELAYED_RELEASE_TABLET | Freq: Every day | ORAL | Status: DC
  Administered 2023-11-18 – 2023-11-21 (×4): 40 mg via ORAL
  Filled 2023-11-18 (×5): qty 1

## 2023-11-18 MED ORDER — INSULIN ASPART 100 UNIT/ML IJ SOLN: 0.0000 [IU] | Freq: Every day | INTRAMUSCULAR | Status: DC

## 2023-11-18 MED ORDER — VANCOMYCIN HCL 1250 MG/250ML IV SOLN
1250.0000 mg | INTRAVENOUS | Status: DC
  Administered 2023-11-18 – 2023-11-19 (×2): 1250 mg via INTRAVENOUS
  Filled 2023-11-18 (×2): qty 250

## 2023-11-18 MED ORDER — LORAZEPAM 1 MG PO TABS
1.0000 mg | ORAL_TABLET | Freq: Two times a day (BID) | ORAL | Status: DC | PRN
  Administered 2023-11-20: 1 mg via ORAL
  Filled 2023-11-18: qty 1

## 2023-11-18 MED ORDER — ACETAMINOPHEN 325 MG PO TABS: 650.0000 mg | ORAL_TABLET | Freq: Four times a day (QID) | ORAL | Status: DC | PRN

## 2023-11-18 MED ORDER — FESOTERODINE FUMARATE ER 4 MG PO TB24: 4.0000 mg | ORAL_TABLET | Freq: Every day | ORAL | Status: DC

## 2023-11-18 MED ORDER — LACTATED RINGERS IV BOLUS
1000.0000 mL | Freq: Once | INTRAVENOUS | Status: AC
  Administered 2023-11-18: 1000 mL via INTRAVENOUS

## 2023-11-18 MED ORDER — LORATADINE 10 MG PO TABS
10.0000 mg | ORAL_TABLET | Freq: Every day | ORAL | Status: DC
  Administered 2023-11-18 – 2023-11-21 (×4): 10 mg via ORAL
  Filled 2023-11-18 (×4): qty 1

## 2023-11-18 MED ORDER — SODIUM CHLORIDE 0.9% FLUSH
3.0000 mL | Freq: Two times a day (BID) | INTRAVENOUS | Status: DC
  Administered 2023-11-18 – 2023-11-21 (×5): 3 mL via INTRAVENOUS

## 2023-11-18 MED ORDER — SODIUM CHLORIDE 0.9% FLUSH: 3.0000 mL | INTRAVENOUS | Status: DC | PRN

## 2023-11-18 MED ORDER — IPRATROPIUM BROMIDE 0.02 % IN SOLN: 0.5000 mg | Freq: Four times a day (QID) | RESPIRATORY_TRACT | Status: DC | PRN

## 2023-11-18 MED ORDER — ESCITALOPRAM OXALATE 10 MG PO TABS
20.0000 mg | ORAL_TABLET | Freq: Every day | ORAL | Status: DC
  Administered 2023-11-18 – 2023-11-21 (×4): 20 mg via ORAL
  Filled 2023-11-18 (×4): qty 2

## 2023-11-18 MED ORDER — ROSUVASTATIN CALCIUM 20 MG PO TABS
40.0000 mg | ORAL_TABLET | Freq: Every day | ORAL | Status: DC
  Administered 2023-11-18 – 2023-11-21 (×4): 40 mg via ORAL
  Filled 2023-11-18 (×4): qty 2

## 2023-11-18 MED ORDER — VANCOMYCIN VARIABLE DOSE PER UNSTABLE RENAL FUNCTION (PHARMACIST DOSING): Status: DC

## 2023-11-18 MED ORDER — METOPROLOL TARTRATE 25 MG PO TABS: 12.5000 mg | ORAL_TABLET | Freq: Two times a day (BID) | ORAL | Status: DC

## 2023-11-18 MED ORDER — ACETAMINOPHEN 500 MG PO TABS
1000.0000 mg | ORAL_TABLET | Freq: Four times a day (QID) | ORAL | Status: DC | PRN
  Administered 2023-11-20 – 2023-11-21 (×2): 1000 mg via ORAL
  Filled 2023-11-18 (×2): qty 2

## 2023-11-18 MED ORDER — TERBINAFINE HCL 1 % EX CREA
TOPICAL_CREAM | Freq: Two times a day (BID) | CUTANEOUS | Status: DC
  Filled 2023-11-18: qty 12

## 2023-11-18 MED ORDER — LEVOTHYROXINE SODIUM 50 MCG PO TABS
50.0000 ug | ORAL_TABLET | Freq: Every day | ORAL | Status: DC
  Administered 2023-11-18 – 2023-11-21 (×4): 50 ug via ORAL
  Filled 2023-11-18 (×4): qty 1

## 2023-11-18 MED ORDER — POTASSIUM CHLORIDE CRYS ER 20 MEQ PO TBCR
40.0000 meq | EXTENDED_RELEASE_TABLET | Freq: Once | ORAL | Status: AC
Start: 2023-11-18 — End: 2023-11-18
  Administered 2023-11-18: 40 meq via ORAL
  Filled 2023-11-18: qty 2

## 2023-11-18 MED ORDER — PERFLUTREN LIPID MICROSPHERE
1.0000 mL | INTRAVENOUS | Status: AC | PRN
  Administered 2023-11-18: 1.5 mL via INTRAVENOUS

## 2023-11-18 MED ORDER — METOPROLOL TARTRATE 12.5 MG HALF TABLET
12.5000 mg | ORAL_TABLET | Freq: Two times a day (BID) | ORAL | Status: DC
  Administered 2023-11-18 – 2023-11-20 (×6): 12.5 mg via ORAL
  Filled 2023-11-18 (×6): qty 1

## 2023-11-18 MED ORDER — APIXABAN 5 MG PO TABS
5.0000 mg | ORAL_TABLET | Freq: Two times a day (BID) | ORAL | Status: DC
  Administered 2023-11-18 – 2023-11-21 (×7): 5 mg via ORAL
  Filled 2023-11-18 (×7): qty 1

## 2023-11-18 MED ORDER — SODIUM CHLORIDE 0.9 % IV SOLN: 250.0000 mL | INTRAVENOUS | Status: DC | PRN

## 2023-11-18 MED ORDER — INSULIN ASPART 100 UNIT/ML IJ SOLN
0.0000 [IU] | Freq: Three times a day (TID) | INTRAMUSCULAR | Status: DC
  Administered 2023-11-18 – 2023-11-21 (×6): 1 [IU] via SUBCUTANEOUS

## 2023-11-18 MED ORDER — ONDANSETRON HCL 4 MG/2ML IJ SOLN: 4.0000 mg | Freq: Four times a day (QID) | INTRAMUSCULAR | Status: DC | PRN

## 2023-11-18 NOTE — Progress Notes (Signed)
   11/18/23 1500  Spiritual Encounters  Type of Visit Initial  Care provided to: Patient  Referral source Patient request  Reason for visit Routine spiritual support  OnCall Visit No  Spiritual Framework  Presenting Themes Impactful experiences and emotions  Patient Stress Factors Financial concerns;Major life changes  Family Stress Factors Family relationships  Interventions  Spiritual Care Interventions Made Established relationship of care and support;Compassionate presence;Reflective listening;Normalization of emotions;Meaning making;Prayer;Encouragement  Intervention Outcomes  Outcomes Connection to spiritual care;Awareness of support;Reduced anxiety;Reduced isolation  Spiritual Care Plan  Spiritual Care Issues Still Outstanding No further spiritual care needs at this time (see row info)   Chaplain spiritual support services remain available as the need arises.

## 2023-11-18 NOTE — Plan of Care (Signed)
  Problem: Health Behavior/Discharge Planning: Goal: Ability to manage health-related needs will improve Outcome: Progressing   Problem: Metabolic: Goal: Ability to maintain appropriate glucose levels will improve Outcome: Progressing   Problem: Skin Integrity: Goal: Risk for impaired skin integrity will decrease Outcome: Progressing   Problem: Pain Managment: Goal: General experience of comfort will improve and/or be controlled Outcome: Progressing

## 2023-11-18 NOTE — Assessment & Plan Note (Signed)
 11-18-2023 on left thigh. Add antifungal cream.     11-19-2023 stable

## 2023-11-18 NOTE — Progress Notes (Signed)
 PROGRESS NOTE    Andrea Perry  FMW:[id number] DOB: 1950/05/18 DOA: 11/17/2023 PCP: [name], MD  Subjective: Pt seen and examined. Feels better after IVF. On RA. Wants to take a shower.   Hospital Course: ED TRIAGE note:  Pt present with a rash to the rt leg x yesterday. Pt denies itching, burning and pain. States it is hot to the touch. Pts niece states she has had a fever today and has applied cold compresses to body.      HPI:  Andrea Perry is a 73 y.o. female with medical history significant of  persistent atrial fibrillation, COPD, nephrolithiasis, pulmonary hypertension, essential hypertension, obstructive sleep apnea not on CPAP, anxiety, depression, DM type II, GERD, history of hemorrhoid and hematochezia, hyperlipidemia, overactive bladder and morbid obesity who has been present emergency department complaining of fever, weakness and rash of the right lower extremity concern for infection.  Patient reported that she has recently noticed rash of her right leg.  Also feeling shortness of breath, fever and having some weakness as well.  At home she has fever 101 F.  Patient was evaluated urgent care and concern for sepsis referred to emergency department for further evaluation.  Patient also complaining of left-sided flank pain and history of renal stone.   Patient reported that she has bilateral lower extremities edema which is chronic in nature however recently noticed right lower extremity redness and left-sided hip area rash as well.   Patient also reported orthopnea and dyspnea which is chronic in nature.   Patient denies any productive cough, nausea, vomiting and diarrhea.  Denies any UTI symptoms.   In the ED code sepsis has been activated.  Patient is receiving total 3 liter of LR bolus also received vancomycin , ceftriaxone  and cefepime .   Hospitalist has been consulted for further eval for management of sepsis in the setting of right  lower extremity cellulitis,  hyponatremia, hypokalemia and elevated BNP.  Significant Events: Admitted 11/17/2023 for sepsis due to cellulitis   Admission Labs: WBC 16.6, HgB 12.1, plt 187 BNP 679 Na 132, K 3.2, CO2 of 20, BUN 14, Scr 1.58, glu 236 T prot 5.9, alb 2.7, AST 31, ALT 16, alk phos 60, T. Bili 1.5 Lactic acid 4.9 Covid/flu/rsv UA hazy, HgB large, neg nitrite, neg LE, RBC >50, bacteria many A1c 6.0%  Admission Imaging Studies: CXR No active cardiopulmonary disease.  CT chest/abd/pelvis No acute cardiopulmonary disease. Three-vessel coronary artery disease, aortic atherosclerosis. 11 mm right renal pelvic stone without hydronephrosis. Perinephric stranding noted. Findings are stable since prior study. Aortic atherosclerosis. No acute findings in the abdomen or pelvis.  Significant Labs:   Significant Imaging Studies: Echo shows LVEF 60 to 65%   Antibiotic Therapy: Anti-infectives (From admission, onward)    Start     Dose/Rate Route Frequency Ordered Stop   11/18/23 1000  ceFEPIme  (MAXIPIME ) 2 g in sodium chloride  0.9 % 100 mL IVPB        2 g 200 mL/hr over 30 Minutes Intravenous Every 12 hours 11/18/23 0113     11/18/23 0112  vancomycin  variable dose per unstable renal function (pharmacist dosing)         Does not apply See admin instructions 11/18/23 0113     11/17/23 2245  vancomycin  (VANCOREADY) IVPB 2000 mg/400 mL        2,000 mg 200 mL/hr over 120 Minutes Intravenous  Once 11/17/23 2234 11/18/23 0109   11/17/23 2245  ceFEPIme  (MAXIPIME ) 2 g in sodium  chloride 0.9 % 100 mL IVPB        2 g 200 mL/hr over 30 Minutes Intravenous STAT 11/17/23 2237 11/17/23 2343   11/17/23 2200  cefTRIAXone  (ROCEPHIN ) 2 g in sodium chloride  0.9 % 100 mL IVPB        2 g 200 mL/hr over 30 Minutes Intravenous Once 11/17/23 2158 11/17/23 2228       Procedures:   Consultants:     Assessment and Plan: * Sepsis due to cellulitis (HCC) 11-18-2023 sepsis present on admission. Lactic acid elevated at 2.2,  WBC 16.6, HR 108. Due to cellulitis right foot. Did not have septic shock.  Cellulitis of right leg 11-18-2023 right leg/foot with erythema and some edema. Continue with IV cefepime  and vanco. Check ESR and CRP in AM. Awaiting blood cx  Acute kidney injury superimposed on stage 3a chronic kidney disease (HCC) - baseline scr 0.9 - 1.1 11-18-2023 admission Scr of 1.58.  baseline scr 0.9 - 1.1. holding diuretics, ACE/ARB for now. Repeat BMP in AM.  Ringworm of body 11-18-2023 on left thigh. Add antifungal cream.      Elevated brain natriuretic peptide (BNP) level 11-18-2023 does not appear volume overloaded at this time. Echo shows LVEF 60 to 65%   Hypokalemia 11-18-2023 replete with oral Kcl. Repeat BMP in AM.  Obstructive sleep apnea 11-18-2023 stable.  Persistent atrial fibrillation (HCC) 11-18-2023 on Eliquis  and lopressor  12.5 mg bid  Non-insulin  dependent type 2 diabetes mellitus (HCC) 11-18-2023 on SSI. A1C 6.0% indicating good outpatient control  Essential hypertension 11-18-2023 on lopressor  12.5 mg bid for rate control/BP control. Holding lasix , avapro  due to AKI.  GAD (generalized anxiety disorder) 11-18-2023 continue lexapro  and prn ativan  po.  Hyperlipidemia 11-18-2023 on crestor  40 mg daily.  Acquired hypothyroidism 11-18-2023 continue synthroid  50 mcg daily.  Obesity, Class III, BMI 40-49.9 (morbid obesity) Body mass index is 48.47 kg/m.   DVT prophylaxis: SCDs Start: 11/18/23 0103 Place TED hose Start: 11/18/23 0103 apixaban  (ELIQUIS ) tablet 5 mg     Code Status: Full Code Family Communication: no family at bedside. Pt is decisional Disposition Plan: return home Reason for continuing need for hospitalization: remains on IV Abx.  Objective: Vitals:   11/18/23 1110 11/18/23 1500 11/18/23 1527 11/18/23 1635  BP:  103/63  117/72  Pulse:  (!) 103  72  Resp:  18  20  Temp: 98.6 F (37 C)  98.1 F (36.7 C) 98.2 F (36.8 C)  TempSrc: Oral  Oral  Oral  SpO2:  100%    Weight:      Height:        Intake/Output Summary (Last 24 hours) at 11/18/2023 1808 Last data filed at 11/18/2023 0503 Gross per 24 hour  Intake 2955.48 ml  Output --  Net 2955.48 ml   Filed Weights   11/17/23 2023  Weight: 120.2 kg    Examination:  Physical Exam Vitals and nursing note reviewed.  Constitutional:      General: She is not in acute distress.    Appearance: She is obese. She is not toxic-appearing or diaphoretic.  Neurological:     Mental Status: She is alert.     Data Reviewed: I have personally reviewed following labs and imaging studies  CBC: Recent Labs  Lab 11/17/23 2038 11/18/23 0511  WBC 16.6* 15.1*  NEUTROABS 14.8*  --   HGB 12.1 10.1*  HCT 36.2 30.3*  MCV 91.6 92.4  PLT 187 171   Basic Metabolic Panel: Recent Labs  Lab  11/17/23 2038 11/18/23 0511  NA 132* 135  K 3.2* 3.3*  CL 97* 103  CO2 20* 25  GLUCOSE 236* 182*  BUN 14 13  CREATININE 1.58* 1.17*  CALCIUM  7.5* 7.2*   GFR: Estimated Creatinine Clearance: 55.2 mL/min (A) (by C-G formula based on SCr of 1.17 mg/dL (H)). Liver Function Tests: Recent Labs  Lab 11/17/23 2038  AST 31  ALT 16  ALKPHOS 60  BILITOT 1.5*  PROT 5.9*  ALBUMIN 2.7*   BNP (last 3 results) Recent Labs    12/17/22 1727 11/17/23 2038  BNP 204.2* 679.9*   HbA1C: Recent Labs    11/18/23 0200  HGBA1C 6.0*   CBG: Recent Labs  Lab 11/18/23 0824 11/18/23 1238 11/18/23 1641  GLUCAP 147* 121* 160*   Sepsis Labs: Recent Labs  Lab 11/17/23 2214 11/18/23 0042 11/18/23 0200 11/18/23 0511  LATICACIDVEN 4.9* 4.8* 2.2* 2.0*    Recent Results (from the past 240 hours)  Resp panel by RT-PCR (RSV, Flu A&B, Covid) Anterior Nasal Swab     Status: None   Collection Time: 11/17/23  9:11 PM   Specimen: Anterior Nasal Swab  Result Value Ref Range Status   SARS Coronavirus 2 by RT PCR NEGATIVE NEGATIVE Final   Influenza A by PCR NEGATIVE NEGATIVE Final   Influenza B by PCR  NEGATIVE NEGATIVE Final    Comment: (NOTE) The Xpert Xpress SARS-CoV-2/FLU/RSV plus assay is intended as an aid in the diagnosis of influenza from Nasopharyngeal swab specimens and should not be used as a sole basis for treatment. Nasal washings and aspirates are unacceptable for Xpert Xpress SARS-CoV-2/FLU/RSV testing.  Fact Sheet for Patients: BloggerCourse.com  Fact Sheet for Healthcare Providers: SeriousBroker.it  This test is not yet approved or cleared by the [place]  FDA and has been authorized for detection and/or diagnosis of SARS-CoV-2 by FDA under an Emergency Use Authorization (EUA). This EUA will remain in effect (meaning this test can be used) for the duration of the COVID-19 declaration under Section 564(b)(1) of the Act, 21 U.S.C. section 360bbb-3(b)(1), unless the authorization is terminated or revoked.     Resp Syncytial Virus by PCR NEGATIVE NEGATIVE Final    Comment: (NOTE) Fact Sheet for Patients: BloggerCourse.com  Fact Sheet for Healthcare Providers: SeriousBroker.it  This test is not yet approved or cleared by the [place]  FDA and has been authorized for detection and/or diagnosis of SARS-CoV-2 by FDA under an Emergency Use Authorization (EUA). This EUA will remain in effect (meaning this test can be used) for the duration of the COVID-19 declaration under Section 564(b)(1) of the Act, 21 U.S.C. section 360bbb-3(b)(1), unless the authorization is terminated or revoked.  Performed at [facility], [address] [place], [place] [address]   Blood Culture (routine x 2)     Status: None (Preliminary result)   Collection Time: 11/17/23 10:15 PM   Specimen: BLOOD RIGHT FOREARM  Result Value Ref Range Status   Specimen Description BLOOD RIGHT FOREARM  Final   Special Requests   Final    BOTTLES DRAWN AEROBIC AND ANAEROBIC Blood Culture  adequate volume   Culture   Final    NO GROWTH < 24 HOURS Performed at [facility], [address] [place], [place] [address]    Report Status PENDING  Incomplete  Blood Culture (routine x 2)     Status: None (Preliminary result)   Collection Time: 11/17/23 10:18 PM   Specimen: BLOOD  Result Value Ref Range Status  Specimen Description BLOOD LEFT ANTECUBITAL  Final   Special Requests   Final    BOTTLES DRAWN AEROBIC AND ANAEROBIC Blood Culture adequate volume   Culture   Final    NO GROWTH < 24 HOURS Performed at [facility], [address], [place], [place] [address]    Report Status PENDING  Incomplete     Radiology Studies: ECHOCARDIOGRAM COMPLETE Result Date: 11/18/2023    ECHOCARDIOGRAM REPORT   Patient Name:   MARSHE SHRESTHA Date of Exam: 11/18/2023 Medical Rec #:  [id number]     Height:       62.0 in Accession #:    [id number]    Weight:       265.0 lb Date of Birth:  1950-11-17     BSA:          2.155 m Patient Age:    72 years      BP:           100/64 mmHg Patient Gender: F             HR:           92 bpm. Exam Location:  Inpatient Procedure: 2D Echo, Cardiac Doppler, Color Doppler and Intracardiac            Opacification Agent (Both Spectral and Color Flow Doppler were            utilized during procedure). Indications:    Elevated BNP  History:        Patient has prior history of Echocardiogram examinations. Risk                 Factors:Hypertension.  Sonographer:    Vella Key Referring Phys: SUBRINA SUNDIL IMPRESSIONS  1. Left ventricular ejection fraction, by estimation, is 60 to 65%. The left ventricle has normal function. The left ventricle has no regional wall motion abnormalities. Left ventricular diastolic function could not be evaluated.  2. Right ventricular systolic function is mildly reduced. The right ventricular size is moderately enlarged.  3. Right atrial size was moderately dilated.  4. The mitral valve is normal in structure. No evidence of mitral  valve regurgitation. No evidence of mitral stenosis.  5. Tricuspid valve regurgitation is moderate to severe.  6. The aortic valve is tricuspid. There is mild calcification of the aortic valve. Aortic valve regurgitation is not visualized. No aortic stenosis is present.  7. The inferior vena cava is dilated in size with <50% respiratory variability, suggesting right atrial pressure of 15 mmHg. Conclusion(s)/Recommendation(s): Echo limited due to poor sound wave transmission. Findings suggestive of elevated R-sided pressures. FINDINGS  Left Ventricle: Left ventricular ejection fraction, by estimation, is 60 to 65%. The left ventricle has normal function. The left ventricle has no regional wall motion abnormalities. The left ventricular internal cavity size was normal in size. There is  no left ventricular hypertrophy. Left ventricular diastolic function could not be evaluated due to atrial fibrillation. Left ventricular diastolic function could not be evaluated. Right Ventricle: The right ventricular size is moderately enlarged. No increase in right ventricular wall thickness. Right ventricular systolic function is mildly reduced. Left Atrium: Left atrial size was normal in size. Right Atrium: Right atrial size was moderately dilated. Pericardium: There is no evidence of pericardial effusion. Mitral Valve: The mitral valve is normal in structure. No evidence of mitral valve regurgitation. No evidence of mitral valve stenosis. Tricuspid Valve: The tricuspid valve is normal in structure. Tricuspid valve regurgitation is moderate to  severe. No evidence of tricuspid stenosis. Aortic Valve: The aortic valve is tricuspid. There is mild calcification of the aortic valve. Aortic valve regurgitation is not visualized. No aortic stenosis is present. Pulmonic Valve: The pulmonic valve was normal in structure. Pulmonic valve regurgitation is not visualized. No evidence of pulmonic stenosis. Aorta: The aortic root is normal in  size and structure. Venous: The inferior vena cava is dilated in size with less than 50% respiratory variability, suggesting right atrial pressure of 15 mmHg. IAS/Shunts: No atrial level shunt detected by color flow Doppler.  LEFT VENTRICLE PLAX 2D LVIDd:         4.90 cm      Diastology LVIDs:         3.30 cm      LV e' medial:    8.70 cm/s LV PW:         1.00 cm      LV E/e' medial:  9.8 LV IVS:        1.00 cm      LV e' lateral:   8.81 cm/s LVOT diam:     1.60 cm      LV E/e' lateral: 9.7 LVOT Area:     2.01 cm  LV Volumes (MOD) LV vol d, MOD A2C: 122.0 ml LV vol d, MOD A4C: 142.0 ml LV vol s, MOD A2C: 40.8 ml LV vol s, MOD A4C: 57.0 ml LV SV MOD A2C:     81.2 ml LV SV MOD A4C:     142.0 ml LV SV MOD BP:      83.9 ml RIGHT VENTRICLE RV Basal diam:  4.20 cm RV S prime:     11.10 cm/s TAPSE (M-mode): 1.4 cm LEFT ATRIUM             Index        RIGHT ATRIUM           Index LA diam:        4.00 cm 1.86 cm/m   RA Area:     25.30 cm LA Vol (A2C):   68.9 ml 31.98 ml/m  RA Volume:   92.60 ml  42.98 ml/m LA Vol (A4C):   39.3 ml 18.24 ml/m LA Biplane Vol: 53.9 ml 25.02 ml/m   AORTA Ao Root diam: 2.80 cm Ao Asc diam:  3.10 cm MITRAL VALVE               TRICUSPID VALVE MV Area (PHT): 4.15 cm    TV Peak grad:   9.6 mmHg MV Decel Time: 183 msec    TV Vmax:        1.55 m/s MV E velocity: 85.40 cm/s MV A velocity: 21.00 cm/s  SHUNTS MV E/A ratio:  4.07        Systemic Diam: 1.60 cm Toribio Fuel MD Electronically signed by Toribio Fuel MD Signature Date/Time: 11/18/2023/6:04:53 PM    Final    CT CHEST ABDOMEN PELVIS WO CONTRAST Result Date: 11/17/2023 CLINICAL DATA:  SOB, FLANK PAIN, SEPTIC EXAM: CT CHEST, ABDOMEN AND PELVIS WITHOUT CONTRAST TECHNIQUE: Multidetector CT imaging of the chest, abdomen and pelvis was performed following the standard protocol without IV contrast. RADIATION DOSE REDUCTION: This exam was performed according to the departmental dose-optimization program which includes automated exposure  control, adjustment of the mA and/or kV according to patient size and/or use of iterative reconstruction technique. COMPARISON:  07/01/2023 FINDINGS: CT CHEST FINDINGS Cardiovascular: Heart is normal size. Aorta is normal caliber. Three vessel coronary artery  disease and moderate aortic atherosclerosis. Mediastinum/Nodes: No mediastinal, hilar, or axillary adenopathy. Trachea and esophagus are unremarkable. Thyroid  unremarkable. Lungs/Pleura: Lungs are clear. No focal airspace opacities or suspicious nodules. No effusions. Musculoskeletal: Chest wall soft tissues are unremarkable. No acute bony abnormality. CT ABDOMEN PELVIS FINDINGS Hepatobiliary: No focal hepatic abnormality. Gallbladder unremarkable. Pancreas: No focal abnormality or ductal dilatation. Spleen: No focal abnormality.  Normal size. Adrenals/Urinary Tract: Adrenal glands normal. 11 mm right renal pelvic stone. No hydronephrosis. Mild perinephric stranding. Findings are similar to prior study. No stones or hydronephrosis on the left. Urinary bladder unremarkable. Stomach/Bowel: Stomach, large and small bowel grossly unremarkable. Vascular/Lymphatic: Aortic atherosclerosis. No evidence of aneurysm or adenopathy. Reproductive: Prior hysterectomy.  No adnexal masses. Other: No free fluid or free air. Musculoskeletal: No acute bony abnormality. IMPRESSION: No acute cardiopulmonary disease. Three-vessel coronary artery disease, aortic atherosclerosis. 11 mm right renal pelvic stone without hydronephrosis. Perinephric stranding noted. Findings are stable since prior study. Aortic atherosclerosis. No acute findings in the abdomen or pelvis. Electronically Signed   By: Franky Crease M.D.   On: 11/17/2023 23:35   DG Chest 2 View Result Date: 11/17/2023 CLINICAL DATA:  Shortness of breath. EXAM: CHEST - 2 VIEW COMPARISON:  12/17/2022. FINDINGS: The heart is borderline enlarged and mediastinal contours are within normal limits. There is atherosclerotic  calcification of the aorta. No consolidation, effusion, or pneumothorax is seen. Degenerative changes are present in the thoracic spine. No acute osseous abnormality. IMPRESSION: No active cardiopulmonary disease. Electronically Signed   By: Leita Birmingham M.D.   On: 11/17/2023 20:47    Scheduled Meds:  apixaban   5 mg Oral BID   budesonide -glycopyrrolate -formoterol   2 puff Inhalation BID   escitalopram   20 mg Oral Daily   insulin  aspart  0-5 Units Subcutaneous QHS   insulin  aspart  0-6 Units Subcutaneous TID WC   levothyroxine   50 mcg Oral Q0600   loratadine   10 mg Oral Daily   metoprolol  tartrate  12.5 mg Oral BID   pantoprazole   40 mg Oral Daily   potassium chloride   40 mEq Oral Once   rosuvastatin   40 mg Oral Daily   sodium chloride  flush  3 mL Intravenous Q12H   sodium chloride  flush  3 mL Intravenous Q12H   Continuous Infusions:  sodium chloride      ceFEPime  (MAXIPIME ) IV 2 g (11/18/23 1023)   lactated ringers  125 mL/hr at 11/18/23 0503   vancomycin        LOS: 0 days   Time spent: 60 minutes  Camellia Door, DO  [facility]  11/18/2023, 6:08 PM

## 2023-11-18 NOTE — Assessment & Plan Note (Signed)
 11-18-2023 sepsis present on admission. Lactic acid elevated at 2.2, WBC 16.6, HR 108. Due to cellulitis right foot. Did not have septic shock.

## 2023-11-18 NOTE — Assessment & Plan Note (Signed)
 11-18-2023 stable.  11-19-2023 stable

## 2023-11-18 NOTE — Progress Notes (Signed)
 While completing admission assessment/ screenings, patient was tearful and expressing concerns from learning about her brother's terminal illness diagnosis in addition to her own health challenges. Supportive presence provided. Chaplain requested for prayers.  Chaplain order placed.

## 2023-11-18 NOTE — Assessment & Plan Note (Signed)
 11-18-2023 replete with oral Kcl. Repeat BMP in AM.  11-19-2023 stable. Resolved. K 3.9 today.

## 2023-11-18 NOTE — Assessment & Plan Note (Addendum)
 11-18-2023 continue synthroid  50 mcg daily.  11-19-2023 stable

## 2023-11-18 NOTE — Assessment & Plan Note (Signed)
 11-18-2023 on lopressor  12.5 mg bid for rate control/BP control. Holding lasix , avapro  due to AKI.  11-19-2023 continue lopressor  12.5 mg bid. Restarting lasix  60 mg bid due to LE edema from all the IVF she received in the ER. Her AK has resolved. Continue to hold ARB.

## 2023-11-18 NOTE — Assessment & Plan Note (Signed)
 11-18-2023 on crestor  40 mg daily.  11-19-2023 stable

## 2023-11-18 NOTE — ED Notes (Signed)
 Pt assisted to the [facility]. Pt had a BM & urinated at this time. Assisted back in bed, barrier cream applied to the patients bottom and thighs. Call bell within reach. No other concerns at this time.

## 2023-11-18 NOTE — Assessment & Plan Note (Addendum)
 11-18-2023 admission Scr of 1.58.  baseline scr 0.9 - 1.1. holding diuretics, ACE/ARB for now. Repeat BMP in AM.  11-19-2023 resolved. Scr down to 0.89

## 2023-11-18 NOTE — Assessment & Plan Note (Signed)
Body mass index is 48.47 kg/m.

## 2023-11-18 NOTE — Progress Notes (Signed)
 Pharmacy Antibiotic Note  Andrea Perry is a 73 y.o. female admitted on 11/17/2023 with sepsis and cellulitis.  Pharmacy has been consulted for vancomycin  and cefepime  dosing.  Pt w/ AKI; baseline SCr <1, now 1.58.  Plan: Rec'd vanc load in ED; hold off on further dosing, monitor SCr +/- vanc levels. Cefepime  2g IV Q12H.  Height: 5' 2 (157.5 cm) Weight: 120.2 kg (265 lb) IBW/kg (Calculated) : 50.1  Temp (24hrs), Avg:99.5 F (37.5 C), Min:98.9 F (37.2 C), Max:100.1 F (37.8 C)  Recent Labs  Lab 11/17/23 2038 11/17/23 2214 11/18/23 0042  WBC 16.6*  --   --   CREATININE 1.58*  --   --   LATICACIDVEN  --  4.9* 4.8*    Estimated Creatinine Clearance: 40.8 mL/min (A) (by C-G formula based on SCr of 1.58 mg/dL (H)).    Allergies  Allergen Reactions   Tramadol Hcl Nausea And Vomiting and Other (See Comments)     mouth dryness, headache   Codeine  Nausea And Vomiting and Nausea Only   Tape Rash    Plastic tape, bandaids and ekg leads, causes redness and rash Other reaction(s): Not available   Vicodin [Hydrocodone -Acetaminophen ] Nausea And Vomiting    Thank you for allowing pharmacy to be a part of this patient's care.  Marvetta Dauphin, PharmD, BCPS  11/18/2023 1:11 AM

## 2023-11-18 NOTE — Subjective & Objective (Signed)
 Pt seen and examined. Stable. C/o of swelling in her lower legs. Scr back to normal. WBC is now 9.5.  was 16.6K on admission.  ESR elevated to 38 and CRP 20.3  Pt has been afebrile. Discussed with pharmacy. Will downgrade IV abx to Rocephin  2 gram daily.  (Had been on Cefepime  since admission).

## 2023-11-18 NOTE — Assessment & Plan Note (Signed)
 11-18-2023 on Eliquis  and lopressor  12.5 mg bid  11-19-2023 stable

## 2023-11-18 NOTE — Assessment & Plan Note (Signed)
 11-18-2023 does not appear volume overloaded at this time. Echo shows LVEF 60 to 65%   11-19-2023 stable

## 2023-11-18 NOTE — Assessment & Plan Note (Signed)
 11-18-2023 right leg/foot with erythema and some edema. Continue with IV cefepime  and vanco. Check ESR and CRP in AM. Awaiting blood cx  11-19-2023  C/o of swelling in her lower legs. Scr back to normal. WBC is now 9.5.  was 16.6K on admission. ESR elevated to 38 and CRP 20.3 Pt has been afebrile. Discussed with pharmacy. Will downgrade IV abx to Rocephin  2 gram daily.  (Had been on Cefepime  since admission).  Pt with less erythema. Just edema of her legs today.  Based on serial pictures, her cellulitis is improving. Plan for 2 more days of IV abx and discharge to home on Sunday with po abx.  Will restart lasix  60 mg bid to help with edema. Likely due to large amounts of IVF she received for her sepsis(present on admission).  11-17-2023 11-18-2023 11-19-2023

## 2023-11-18 NOTE — Progress Notes (Signed)
 Admission assessment/screenings  completed at this time.

## 2023-11-18 NOTE — Hospital Course (Addendum)
 ED TRIAGE note:  Pt present with a rash to the rt leg x yesterday. Pt denies itching, burning and pain. States it is hot to the touch. Pts niece states she has had a fever today and has applied cold compresses to body.      HPI:  Andrea Perry is a 73 y.o. female with medical history significant of  persistent atrial fibrillation, COPD, nephrolithiasis, pulmonary hypertension, essential hypertension, obstructive sleep apnea not on CPAP, anxiety, depression, DM type II, GERD, history of hemorrhoid and hematochezia, hyperlipidemia, overactive bladder and morbid obesity who has been present emergency department complaining of fever, weakness and rash of the right lower extremity concern for infection.  Patient reported that she has recently noticed rash of her right leg.  Also feeling shortness of breath, fever and having some weakness as well.  At home she has fever 101 F.  Patient was evaluated urgent care and concern for sepsis referred to emergency department for further evaluation.  Patient also complaining of left-sided flank pain and history of renal stone.   Patient reported that she has bilateral lower extremities edema which is chronic in nature however recently noticed right lower extremity redness and left-sided hip area rash as well.   Patient also reported orthopnea and dyspnea which is chronic in nature.   Patient denies any productive cough, nausea, vomiting and diarrhea.  Denies any UTI symptoms.   In the ED code sepsis has been activated.  Patient is receiving total 3 liter of LR bolus also received vancomycin , ceftriaxone  and cefepime .   Hospitalist has been consulted for further eval for management of sepsis in the setting of right  lower extremity cellulitis, hyponatremia, hypokalemia and elevated BNP.  Significant Events: Admitted 11/17/2023 for sepsis due to cellulitis   Admission Labs: WBC 16.6, HgB 12.1, plt 187 BNP 679 Na 132, K 3.2, CO2 of 20, BUN 14, Scr 1.58, glu  236 T prot 5.9, alb 2.7, AST 31, ALT 16, alk phos 60, T. Bili 1.5 Lactic acid 4.9 Covid/flu/rsv UA hazy, HgB large, neg nitrite, neg LE, RBC >50, bacteria many A1c 6.0%  Admission Imaging Studies: CXR No active cardiopulmonary disease.  CT chest/abd/pelvis No acute cardiopulmonary disease. Three-vessel coronary artery disease, aortic atherosclerosis. 11 mm right renal pelvic stone without hydronephrosis. Perinephric stranding noted. Findings are stable since prior study. Aortic atherosclerosis. No acute findings in the abdomen or pelvis.  Significant Labs:   Significant Imaging Studies: Echo shows LVEF 60 to 65%   Antibiotic Therapy: Anti-infectives (From admission, onward)    Start     Dose/Rate Route Frequency Ordered Stop   11/18/23 1000  ceFEPIme  (MAXIPIME ) 2 g in sodium chloride  0.9 % 100 mL IVPB        2 g 200 mL/hr over 30 Minutes Intravenous Every 12 hours 11/18/23 0113     11/18/23 0112  vancomycin  variable dose per unstable renal function (pharmacist dosing)         Does not apply See admin instructions 11/18/23 0113     11/17/23 2245  vancomycin  (VANCOREADY) IVPB 2000 mg/400 mL        2,000 mg 200 mL/hr over 120 Minutes Intravenous  Once 11/17/23 2234 11/18/23 0109   11/17/23 2245  ceFEPIme  (MAXIPIME ) 2 g in sodium chloride  0.9 % 100 mL IVPB        2 g 200 mL/hr over 30 Minutes Intravenous STAT 11/17/23 2237 11/17/23 2343   11/17/23 2200  cefTRIAXone  (ROCEPHIN ) 2 g in sodium chloride  0.9 %  100 mL IVPB        2 g 200 mL/hr over 30 Minutes Intravenous Once 11/17/23 2158 11/17/23 2228       Procedures:   Consultants:

## 2023-11-18 NOTE — ED Notes (Signed)
 Phleb went to straight stick, pt requested blood be drawn from line

## 2023-11-18 NOTE — ED Notes (Signed)
 NT assisted pt with bedside commode, changing pt linen on bed as well as gown. Pt is now eating lunch

## 2023-11-18 NOTE — H&P (Addendum)
 History and Physical    Andrea Perry FMW:[id number] DOB: 1950-12-12 DOA: 11/17/2023  PCP: [name], MD   Patient coming from: Home   Chief Complaint:  Chief Complaint  Patient presents with   Fever   Shortness of Breath   ED TRIAGE note:  Pt present with a rash to the rt leg x yesterday. Pt denies itching, burning and pain. States it is hot to the touch. Pts niece states she has had a fever today and has applied cold compresses to body.       HPI:  Andrea Perry is a 73 y.o. female with medical history significant of  persistent atrial fibrillation, COPD, nephrolithiasis, pulmonary hypertension, essential hypertension, obstructive sleep apnea not on CPAP, anxiety, depression, DM type II, GERD, history of hemorrhoid and hematochezia, hyperlipidemia, overactive bladder and morbid obesity who has been present emergency department complaining of fever, weakness and rash of the right lower extremity concern for infection.  Patient reported that she has recently noticed rash of her right leg.  Also feeling shortness of breath, fever and having some weakness as well.  At home she has fever 101 F.  Patient was evaluated urgent care and concern for sepsis referred to emergency department for further evaluation.  Patient also complaining of left-sided flank pain and history of renal stone.  Patient reported that she has bilateral lower extremities edema which is chronic in nature however recently noticed right lower extremity redness and left-sided hip area rash as well.   Patient also reported orthopnea and dyspnea which is chronic in nature.  Patient denies any productive cough, nausea, vomiting and diarrhea.  Denies any UTI symptoms.   ED Course:  At presentation to ED patient found tachycardic and borderline hypotensive otherwise hemodynamically stable. Initial lactic acid 4.9 and repeat lactic acid still elevated 4.8. CBC showing leukocytosis 16 otherwise unremarkable. CMP showing  slightly low sodium 132, low potassium 3.2, low bicarb 20, elevated creatinine 1.58, and GFR 35. Normal troponin level x 2. Elevated BNP 679. Respiratory panel negative for COVID, RSV flu. Blood cultures are pending. UA found bacteria positive however normal WBC count, leukocyte esterase nitrate negative.  No evidence of UTI.  EKG showed atrial fibrillation heart rate 119.  Chest x-ray no active disease process. CT chest abdomen pelvis no acute cardiopulmonary process. 11 mm right pelvis stone without hydronephrosis and perinephric stranding.  Otherwise no acute abdominal finding.    In the ED code sepsis has been activated.  Patient is receiving total 3 liter of LR bolus also received vancomycin , ceftriaxone  and cefepime .  Hospitalist has been consulted for further eval for management of sepsis in the setting of right  lower extremity cellulitis, hyponatremia, hypokalemia and elevated BNP.    Significant labs in the ED: Lab Orders         Resp panel by RT-PCR (RSV, Flu A&B, Covid) Anterior Nasal Swab         Blood Culture (routine x 2)         CBC with Differential         Comprehensive metabolic panel         Brain natriuretic peptide         Urinalysis, w/ Reflex to Culture (Infection Suspected) -Urine, Clean Catch         Lactic acid, plasma         HIV Antibody (routine testing w rflx)         Hemoglobin A1c  Creatinine, urine, random         Sodium, urine, random         I-Stat Lactic Acid, ED       Review of Systems:  Review of Systems  Constitutional:  Positive for fever and malaise/fatigue. Negative for chills and weight loss.  Respiratory:  Negative for cough, sputum production, shortness of breath and wheezing.   Cardiovascular:  Negative for chest pain and palpitations.  Gastrointestinal:  Negative for abdominal pain, constipation, diarrhea, heartburn, nausea and vomiting.  Genitourinary:  Negative for dysuria, flank pain, frequency, hematuria and  urgency.  Musculoskeletal:  Negative for back pain, falls, joint pain, myalgias and neck pain.       Bilateral lower extremity swelling  Neurological:  Negative for dizziness and headaches.  Psychiatric/Behavioral:  The patient is not nervous/anxious.     Past Medical History:  Diagnosis Date   ALLERGIC RHINITIS 08/16/2008   ANXIETY 08/16/2008   Arthritis    hands, knees, lower back   ASTHMA 08/16/2008   Asthma    BRONCHITIS     [name]     Bladder leak    Cancer (HCC)    MELANOMA       Chronic lower back pain    problems with disc L2-5   COPD 08/16/2008   DEPRESSION 08/16/2008   Diabetes mellitus without complication (HCC)    Dysrhythmia    hx AF   Excessive daytime sleepiness 06/19/2015   GENITAL HERPES 12/03/2006   GERD (gastroesophageal reflux disease)    H/O hiatal hernia    HEMATOCHEZIA 06/20/2009   Hemorrhoids    History of cardioversion 10/30/14   History of kidney stones    HYPERLIPIDEMIA 08/16/2008   HYPERTENSION 12/03/2006   Hypertension    Impaired glucose tolerance 09/21/2010   Overactive bladder 10/20/2015   Pneumonia    as a child   Pulmonary HTN (HCC) 06/19/2015   Shortness of breath dyspnea    Sleep apnea    MILD NO CPAP ORDERED   Snoring 06/19/2015    Past Surgical History:  Procedure Laterality Date   ABDOMINAL HYSTERECTOMY     APPENDECTOMY     CARDIOVERSION N/A 03/20/2014   Procedure: CARDIOVERSION;  Surgeon: Maude JAYSON Emmer, MD;  Location: [facility] ENDOSCOPY;  Service: Cardiovascular;  Laterality: N/A;   CARDIOVERSION N/A 10/30/2014   Procedure: CARDIOVERSION;  Surgeon: Maude JAYSON Emmer, MD;  Location: [facility] ENDOSCOPY;  Service: Cardiovascular;  Laterality: N/A;   CARDIOVERSION N/A 04/03/2019   Procedure: CARDIOVERSION;  Surgeon: Maranda Leim DEL, MD;  Location: [facility] ENDOSCOPY;  Service: Cardiovascular;  Laterality: N/A;   CARPAL TUNNEL RELEASE     Right + LEFT   CESAREAN SECTION     x 1    COLONOSCOPY  2004   CYST EXCISION     RT ARM    CYSTOCELE REPAIR N/A  10/25/2012   Procedure: ANTERIOR REPAIR (CYSTOCELE);  Surgeon: Peggye Gull, MD;  Location: [facility] ORS;  Service: Gynecology;  Laterality: N/A;   HEEL SPUR SURGERY Bilateral    KNEE SURGERY     Right + LEFT   LUMBAR LAMINECTOMY/DECOMPRESSION MICRODISCECTOMY N/A 03/19/2016   Procedure: Laminectomy and Foraminotomy - Lumbar three-four - Lumbar four-five;  Surgeon: Alm GORMAN Molt, MD;  Location: [facility] OR;  Service: Neurosurgery;  Laterality: N/A;   RADIOLOGY WITH ANESTHESIA Right 11/01/2014   Procedure: MRI RIGHT FOREARM;  Surgeon: Medication Radiologist, MD;  Location: [facility] OR;  Service: Radiology;  Laterality: Right;   RADIOLOGY WITH ANESTHESIA Right 08/29/2015  Procedure: MRI - RIGHT FOREARM;  Surgeon: Medication Radiologist, MD;  Location: [facility] OR;  Service: Radiology;  Laterality: Right;   RADIOLOGY WITH ANESTHESIA N/A 12/05/2015   Procedure: MRI SPINE WITHOUT;  Surgeon: Medication Radiologist, MD;  Location: [facility] OR;  Service: Radiology;  Laterality: N/A;   SKIN CANCER EXCISION     BILAT SHOULDERS   SPLIT NIGHT STUDY  09/04/2015   TONSILLECTOMY AND ADENOIDECTOMY       reports that she quit smoking about 20 years ago. Her smoking use included cigarettes. She started smoking about 50 years ago. She has a 45 pack-year smoking history. She has never used smokeless tobacco. She reports current alcohol use of about 2.0 standard drinks of alcohol per week. She reports that she does not use drugs.  Allergies  Allergen Reactions   Tramadol Hcl Nausea And Vomiting and Other (See Comments)     mouth dryness, headache   Codeine  Nausea And Vomiting and Nausea Only   Tape Rash    Plastic tape, bandaids and ekg leads, causes redness and rash Other reaction(s): Not available   Vicodin [Hydrocodone -Acetaminophen ] Nausea And Vomiting    Family History  Problem Relation Age of Onset   Diabetes Mother    Hyperlipidemia Mother    Hypertension Mother    Colon polyps Mother    Stroke Father    Colon cancer Neg Hx      Prior to Admission medications   Medication Sig Start Date End Date Taking? Authorizing Provider  acetaminophen  (TYLENOL ) 500 MG tablet Take 500 mg by mouth 2 (two) times daily.   Yes [provider]  albuterol  (PROVENTIL ) (2.5 MG/3ML) 0.083% nebulizer solution USE EVERY 4 HOURS WITH IPRATROPIUM 05/24/23  Yes Byrum, Robert S, MD  amLODipine  (NORVASC ) 10 MG tablet Take 1 tablet by mouth once daily 04/02/23  Yes [name], MD  apixaban  (ELIQUIS ) 5 MG TABS tablet TAKE 1 TABLET BY MOUTH TWICE DAILY . APPOINTMENT REQUIRED FOR FUTURE REFILLS 11/05/23  Yes Delford Maude BROCKS, MD  budeson-glycopyrrolate -formoterol  (BREZTRI  AEROSPHERE) 160-9-4.8 MCG/ACT AERO inhaler Inhale 2 puffs into the lungs 2 (two) times daily. 08/04/23  Yes Shelah Lamar RAMAN, MD  cetirizine (ZYRTEC) 10 MG tablet Take 10 mg by mouth daily.   Yes [provider]  Cholecalciferol  50 MCG (2000 UT) TABS 1 tab by mouth once daily 02/05/22  Yes [name], MD  dimenhyDRINATE (DRAMAMINE) 50 MG tablet Take 50 mg by mouth every 8 (eight) hours as needed for nausea. 05/26/16  Yes [provider]  escitalopram  (LEXAPRO ) 20 MG tablet Take 1 tablet (20 mg total) by mouth daily. 09/06/23  Yes [name], MD  estradiol  (ESTRACE ) 1 MG tablet Take 1 mg by mouth daily. 12/18/18  Yes [provider]  Blood Glucose Monitoring Suppl (FREESTYLE LITE) DEVI Use as directed daily E11.9 09/26/18   [name], MD  ferrous sulfate  325 (65 FE) MG tablet Take 1 tablet (325 mg total) by mouth daily. 12/18/22   Horton, Charmaine FALCON, MD  fluorouracil (EFUDEX) 5 % cream Apply topically. 11/06/15   [provider]  furosemide  (LASIX ) 40 MG tablet Take 1 tablet (40 mg total) by mouth daily. 02/04/22   [name], MD  glucose blood (FREESTYLE LITE) test strip Use as instructed once daily E11.9 09/26/18   John, James W, MD  GUAIFENESIN  ER PO Take 600 mg by mouth daily as needed for cough.    [provider]   ipratropium-albuterol  (DUONEB) 0.5-2.5 (3) MG/3ML  SOLN Take 3 mLs by nebulization every 6 (six) hours as needed. 08/04/23   Shelah Lamar RAMAN, MD  irbesartan  (AVAPRO ) 150 MG tablet Take 1 tablet by mouth once daily 03/09/23   [name], MD  ketoconazole  (NIZORAL ) 2 % cream     [provider]  Lancets MISC Use as directed once daily E11.9 09/26/18   [name], MD  levothyroxine  (SYNTHROID ) 50 MCG tablet Take 1 tablet (50 mcg total) by mouth daily. 09/06/23   [name], MD  levothyroxine  (SYNTHROID ) 50 MCG tablet Take 1 tablet (50 mcg total) by mouth daily. 09/06/23   [name], MD  LORazepam  (ATIVAN ) 1 MG tablet Take 1 tablet by mouth twice daily as needed for anxiety 09/10/23   [name], MD  Magnesium  Oxide -Mg Supplement 500 MG CAPS 1 tab by mouth twice per day Patient taking differently: Take 1 capsule by mouth in the morning. 1 tab by mouth twice per day 02/05/22   [name], MD  metFORMIN  (GLUCOPHAGE -XR) 500 MG 24 hr tablet TAKE 2 TABLETS BY MOUTH ONCE DAILY WITH BREAKFAST 03/29/23   [name], MD  metoprolol  tartrate (LOPRESSOR ) 100 MG tablet Take 1 tablet by mouth twice daily 11/04/23   Nishan, Peter C, MD  mupirocin  ointment (BACTROBAN ) 2 % Apply 1 Application topically 2 (two) times daily. Apply to the affected area 2 times a day 02/04/23   Harden Jerona GAILS, MD  pantoprazole  (PROTONIX ) 40 MG tablet Take 1 tablet (40 mg total) by mouth daily. 09/06/23   [name], MD  potassium chloride  SA (KLOR-CON ) 20 MEQ tablet Take 1 tablet (20 mEq total) by mouth daily. 06/14/20   Pokhrel, Laxman, MD  promethazine -dextromethorphan (PROMETHAZINE -DM) 6.25-15 MG/5ML syrup     [provider]  rosuvastatin  (CRESTOR ) 40 MG tablet Take 1 tablet (40 mg total) by mouth daily. 09/06/23   [name], MD  TETRAHYDROZOLINE HCL OP Apply to eye. 05/26/16   [provider]  tiZANidine  (ZANAFLEX ) 4 MG tablet Take 1 tablet (4 mg total) by mouth every 6 (six) hours as  needed for muscle spasms. 07/18/21   [name], MD  tolterodine  (DETROL  LA) 4 MG 24 hr capsule Take 1 capsule (4 mg total) by mouth daily. 02/04/22   [name], MD  vitamin E 180 MG (400 UNITS) capsule Take by mouth. 05/26/16   [provider]     Physical Exam: Vitals:   11/17/23 2012 11/17/23 2023 11/17/23 2145 11/18/23 0103  BP: (!) 109/54  100/62   Pulse: 94  (!) 108   Resp: (!) 24  16   Temp: 98.9 F (37.2 C)   99.5 F (37.5 C)  TempSrc:    Oral  SpO2: 100%  100%   Weight:  120.2 kg    Height:  5' 2 (1.575 m)      Physical Exam Vitals and nursing note reviewed.  Constitutional:      Appearance: She is ill-appearing.  HENT:     Head: Normocephalic and atraumatic.  Eyes:     Pupils: Pupils are equal, round, and reactive to light.  Cardiovascular:     Rate and Rhythm: Tachycardia present. Rhythm irregular.  Pulmonary:     Effort: Pulmonary effort is normal. No tachypnea, accessory muscle usage or respiratory distress.     Breath sounds: No decreased breath sounds, wheezing, rhonchi or rales.  Musculoskeletal:     Cervical back: Neck supple.  Right lower leg: Edema present.     Left lower leg: Edema present.     Comments: Right lower extremity erythema  Skin:    Capillary Refill: Capillary refill takes less than 2 seconds.     Findings: Erythema present.  Neurological:     Mental Status: She is alert and oriented to person, place, and time.  Psychiatric:        Mood and Affect: Mood normal.      Labs on Admission: I have personally reviewed following labs and imaging studies  CBC: Recent Labs  Lab 11/17/23 2038  WBC 16.6*  NEUTROABS 14.8*  HGB 12.1  HCT 36.2  MCV 91.6  PLT 187   Basic Metabolic Panel: Recent Labs  Lab 11/17/23 2038  NA 132*  K 3.2*  CL 97*  CO2 20*  GLUCOSE 236*  BUN 14  CREATININE 1.58*  CALCIUM  7.5*   GFR: Estimated Creatinine Clearance: 40.8 mL/min (A) (by C-G formula based on SCr of 1.58 mg/dL  (H)). Liver Function Tests: Recent Labs  Lab 11/17/23 2038  AST 31  ALT 16  ALKPHOS 60  BILITOT 1.5*  PROT 5.9*  ALBUMIN 2.7*   No results for input(s): LIPASE, AMYLASE in the last 168 hours. No results for input(s): AMMONIA in the last 168 hours. Coagulation Profile: No results for input(s): INR, PROTIME in the last 168 hours. Cardiac Enzymes: Recent Labs  Lab 11/17/23 2038 11/17/23 2215  TROPONINIHS 7 7   BNP (last 3 results) Recent Labs    12/17/22 1727 11/17/23 2038  BNP 204.2* 679.9*   HbA1C: No results for input(s): HGBA1C in the last 72 hours. CBG: No results for input(s): GLUCAP in the last 168 hours. Lipid Profile: No results for input(s): CHOL, HDL, LDLCALC, TRIG, CHOLHDL, LDLDIRECT in the last 72 hours. Thyroid  Function Tests: No results for input(s): TSH, T4TOTAL, FREET4, T3FREE, THYROIDAB in the last 72 hours. Anemia Panel: No results for input(s): VITAMINB12, FOLATE, FERRITIN, TIBC, IRON, RETICCTPCT in the last 72 hours. Urine analysis:    Component Value Date/Time   COLORURINE YELLOW 11/17/2023 2327   APPEARANCEUR HAZY (A) 11/17/2023 2327   LABSPEC 1.011 11/17/2023 2327   PHURINE 5.0 11/17/2023 2327   GLUCOSEU NEGATIVE 11/17/2023 2327   GLUCOSEU NEGATIVE 04/12/2023 1512   HGBUR LARGE (A) 11/17/2023 2327   BILIRUBINUR NEGATIVE 11/17/2023 2327   KETONESUR NEGATIVE 11/17/2023 2327   PROTEINUR 100 (A) 11/17/2023 2327   UROBILINOGEN 1.0 04/12/2023 1512   NITRITE NEGATIVE 11/17/2023 2327   LEUKOCYTESUR NEGATIVE 11/17/2023 2327    Radiological Exams on Admission: I have personally reviewed images CT CHEST ABDOMEN PELVIS WO CONTRAST Result Date: 11/17/2023 CLINICAL DATA:  SOB, FLANK PAIN, SEPTIC EXAM: CT CHEST, ABDOMEN AND PELVIS WITHOUT CONTRAST TECHNIQUE: Multidetector CT imaging of the chest, abdomen and pelvis was performed following the standard protocol without IV contrast. RADIATION DOSE  REDUCTION: This exam was performed according to the departmental dose-optimization program which includes automated exposure control, adjustment of the mA and/or kV according to patient size and/or use of iterative reconstruction technique. COMPARISON:  07/01/2023 FINDINGS: CT CHEST FINDINGS Cardiovascular: Heart is normal size. Aorta is normal caliber. Three vessel coronary artery disease and moderate aortic atherosclerosis. Mediastinum/Nodes: No mediastinal, hilar, or axillary adenopathy. Trachea and esophagus are unremarkable. Thyroid  unremarkable. Lungs/Pleura: Lungs are clear. No focal airspace opacities or suspicious nodules. No effusions. Musculoskeletal: Chest wall soft tissues are unremarkable. No acute bony abnormality. CT ABDOMEN PELVIS FINDINGS Hepatobiliary: No focal hepatic abnormality. Gallbladder unremarkable. Pancreas: No  focal abnormality or ductal dilatation. Spleen: No focal abnormality.  Normal size. Adrenals/Urinary Tract: Adrenal glands normal. 11 mm right renal pelvic stone. No hydronephrosis. Mild perinephric stranding. Findings are similar to prior study. No stones or hydronephrosis on the left. Urinary bladder unremarkable. Stomach/Bowel: Stomach, large and small bowel grossly unremarkable. Vascular/Lymphatic: Aortic atherosclerosis. No evidence of aneurysm or adenopathy. Reproductive: Prior hysterectomy.  No adnexal masses. Other: No free fluid or free air. Musculoskeletal: No acute bony abnormality. IMPRESSION: No acute cardiopulmonary disease. Three-vessel coronary artery disease, aortic atherosclerosis. 11 mm right renal pelvic stone without hydronephrosis. Perinephric stranding noted. Findings are stable since prior study. Aortic atherosclerosis. No acute findings in the abdomen or pelvis. Electronically Signed   By: Franky Crease M.D.   On: 11/17/2023 23:35   DG Chest 2 View Result Date: 11/17/2023 CLINICAL DATA:  Shortness of breath. EXAM: CHEST - 2 VIEW COMPARISON:  12/17/2022.  FINDINGS: The heart is borderline enlarged and mediastinal contours are within normal limits. There is atherosclerotic calcification of the aorta. No consolidation, effusion, or pneumothorax is seen. Degenerative changes are present in the thoracic spine. No acute osseous abnormality. IMPRESSION: No active cardiopulmonary disease. Electronically Signed   By: Leita Birmingham M.D.   On: 11/17/2023 20:47     EKG: My personal interpretation of EKG shows: Atrial fibrillation 100 119.    Assessment/Plan: Principal Problem:   Sepsis due to cellulitis [facility]) Active Problems:   Hypokalemia   AKI (acute kidney injury) (HCC)   Overactive bladder   Hypothyroidism   Hyperlipidemia   GAD (generalized anxiety disorder)   Essential hypertension   Non-insulin  dependent type 2 diabetes mellitus (HCC)   Persistent atrial fibrillation (HCC)   Pulmonary hypertension (HCC)   Obstructive sleep apnea   Hyponatremia   Elevated brain natriuretic peptide (BNP) level   History of COPD   History of nephrolithiasis   Sepsis (HCC)    Assessment and Plan: Sepsis in the setting of right lower extremity cellulitis -Presented emergency department multiple complaints include fever, generalized weakness, right lower extremity rash for last few days.  Went to urgent care found to have sepsis patient was referred to ED for evaluation - At presentation to ED patient initial systolic blood pressure around low 90s and tachycardic.  Otherwise hemodynamically stable. - Due to morbid obesity patient has bilateral lower extremity dermatitis however noticed to have right-sided lower extremity cellulitis. - In the setting of leukocytosis, tachycardia, fever, elevated lactic acid 4.9 and having a source of infection patient meets criteria for sepsis - Respiratory panel negative for COVID, RSV flu.  Chest x-ray unremarkable.  CT chest abdomen pelvis no acute abnormality and 11 mm right pelvic stone without hydronephrosis with  perinephric stranding.  -UA no evidence of UTI. -Per sepsis protocol in ED patient received 3 L of LR bolus and currently on maintenance fluid LR 125 cc/h.  Blood pressure has been improved. - Continue IV vancomycin  and cefepime  pharmacy for the management of sepsis in the setting of cellulitis. - Blood cultures are pending.  Will de-escalate antibiotic based on blood cultures result. -Continue to trend lactic acid level.  Acute kidney injury-secondary to sepsis -Elevated creatinine 1.58.  Prerenal acute kidney injury in the setting of sepsis.  UA no evidence of UTI. - Continue resuscitate with IV fluid.  Monitor urine output.  Hypokalemia -Low potassium 3.2.  Repleted with oral KCl 40 in the ED.  Hyponatremia -Low sodium 132.  Hyponatremia in setting of AKI.  Continue to monitor.  Elevated  BNP -Elevated BNP around 700.  Per chart review patient has history of diastolic heart failure.  Denies any shortness of breath.  Chest x-ray no evidence of pulmonary vascular congestion or edema. - Concern about elevated BNP in the setting of acute physiologic response in the setting of sepsis.  Obtaining echocardiogram.  Paroxysmal atrial fibrillation -In the setting of sepsis decreasing the low-dose of Lopressor  100 mg twice daily to 12.5 mg twice daily.  Continue Eliquis  5 mg twice daily.  History of obstructive sleep apnea -Continue to check pulse ox.  At home patient does not use CPAP at bedtime  History of COPD Stable.  Physical exam no evidence of wheezing rhonchi and rales.  Continue Breztri  twice daily and ipratropium as needed.  History of nephrolithiasis -CT abdomen pelvis showed 11 mm stone in the renal pelvis without any evidence of hydronephrosis.  Continue conservative management.  Essential hypertension Chronic diastolic heart failure - Holding home blood pressure regimen in the setting of sepsis.  Will resume once appropriate.  Non-insulin -dependent DM type II -Holding  metformin  in the setting of significant lactic acidosis.  Continue sliding scale insulin  coverage with mealtime.  Overactive bladder -Continue Toviaz  daily  Generalized anxiety disorder -Continue Lexapro  daily and Ativan  twice daily as needed  Hypothyroidism Continue levothyroxine   GERD - Continue Protonix .  Morbid obesity BMI 48   DVT prophylaxis:  Eliquis  Code Status:  Full Code Diet: Heart healthy carb modified diet Family Communication:   Family was present at bedside, at the time of interview. Opportunity was given to ask question and all questions were answered satisfactorily.  Disposition Plan: Will follow-up with blood culture results in order to de-escalate antibiotics. Consults: None indicated at this time Admission status:   Inpatient, Telemetry bed  Severity of Illness: The appropriate patient status for this patient is INPATIENT. Inpatient status is judged to be reasonable and necessary in order to provide the required intensity of service to ensure the patient's safety. The patient's presenting symptoms, physical exam findings, and initial radiographic and laboratory data in the context of their chronic comorbidities is felt to place them at high risk for further clinical deterioration. Furthermore, it is not anticipated that the patient will be medically stable for discharge from the hospital within 2 midnights of admission.   * I certify that at the point of admission it is my clinical judgment that the patient will require inpatient hospital care spanning beyond 2 midnights from the point of admission due to high intensity of service, high risk for further deterioration and high frequency of surveillance required.DEWAINE    Johsua Shevlin, MD [facility]  How to contact the [facility] Attending or Consulting provider 7A - 7P or covering provider during after hours 7P -7A, for this patient.  Check the care team in [facility] and look for a) attending/consulting [facility] provider  listed and b) the [facility] team listed Log into [email or web address] and use [facility] universal password to access. If you do not have the password, please contact the hospital operator. Locate the [facility] provider you are looking for under [facility] and page to a number that you can be directly reached. If you still have difficulty reaching the provider, please page the [facility] (Director on Call) for the Hospitalists listed on amion for assistance.  11/18/2023, 1:11 AM

## 2023-11-18 NOTE — Assessment & Plan Note (Signed)
 11-18-2023 on SSI. A1C 6.0% indicating good outpatient control  11-19-2023 stable. CBG acceptable ranges

## 2023-11-18 NOTE — Progress Notes (Signed)
 Pharmacy Antibiotic Note  Andrea Perry is a 73 y.o. female admitted on 11/17/2023 with sepsis 2/2 cellulitis.  Pharmacy has been consulted for vancomycin  dosing.  Plan: Vancomycin  1250mg  q24h (eAUC 427, Scr 1.17)  F/u renal function, infectious work up and length of therapy Vancomycin  levels as needed  Height: 5' 2 (157.5 cm) Weight: 120.2 kg (265 lb) IBW/kg (Calculated) : 50.1  Temp (24hrs), Avg:99.3 F (37.4 C), Min:98.8 F (37.1 C), Max:100.1 F (37.8 C)  Recent Labs  Lab 11/17/23 2038 11/17/23 2214 11/18/23 0042 11/18/23 0200 11/18/23 0511  WBC 16.6*  --   --   --  15.1*  CREATININE 1.58*  --   --   --  1.17*  LATICACIDVEN  --  4.9* 4.8* 2.2* 2.0*    Estimated Creatinine Clearance: 55.2 mL/min (A) (by C-G formula based on SCr of 1.17 mg/dL (H)).    Allergies  Allergen Reactions   Tramadol Hcl Nausea And Vomiting and Other (See Comments)     mouth dryness, headache   Codeine  Nausea And Vomiting and Nausea Only   Tape Rash    Plastic tape, bandaids and ekg leads, causes redness and rash Other reaction(s): Not available   Vicodin [Hydrocodone -Acetaminophen ] Nausea And Vomiting    Antimicrobials this admission: Cefepime  7/30 > Vanc 7/30 >  Microbiology results: 7/30 bcx: 7/30 resp pcr: neg  Thank you for allowing pharmacy to be a part of this patient's care.  Andrea Perry Bash 11/18/2023 9:19 AM

## 2023-11-18 NOTE — Assessment & Plan Note (Signed)
 11-18-2023 continue lexapro  and prn ativan  po.  11-19-2023 stable

## 2023-11-19 DIAGNOSIS — L0390 Cellulitis, unspecified: Secondary | ICD-10-CM | POA: Diagnosis not present

## 2023-11-19 DIAGNOSIS — N179 Acute kidney failure, unspecified: Secondary | ICD-10-CM | POA: Diagnosis not present

## 2023-11-19 DIAGNOSIS — L03115 Cellulitis of right lower limb: Secondary | ICD-10-CM | POA: Diagnosis not present

## 2023-11-19 DIAGNOSIS — A419 Sepsis, unspecified organism: Secondary | ICD-10-CM | POA: Diagnosis not present

## 2023-11-19 LAB — CBC WITH DIFFERENTIAL/PLATELET
Abs Immature Granulocytes: 0.04 K/uL (ref 0.00–0.07)
Basophils Absolute: 0 K/uL (ref 0.0–0.1)
Basophils Relative: 0 %
Eosinophils Absolute: 0.1 K/uL (ref 0.0–0.5)
Eosinophils Relative: 1 %
HCT: 31.7 % — ABNORMAL LOW (ref 36.0–46.0)
Hemoglobin: 10.5 g/dL — ABNORMAL LOW (ref 12.0–15.0)
Immature Granulocytes: 0 %
Lymphocytes Relative: 12 %
Lymphs Abs: 1.2 K/uL (ref 0.7–4.0)
MCH: 30.9 pg (ref 26.0–34.0)
MCHC: 33.1 g/dL (ref 30.0–36.0)
MCV: 93.2 fL (ref 80.0–100.0)
Monocytes Absolute: 0.7 K/uL (ref 0.1–1.0)
Monocytes Relative: 7 %
Neutro Abs: 7.4 K/uL (ref 1.7–7.7)
Neutrophils Relative %: 80 %
Platelets: 166 K/uL (ref 150–400)
RBC: 3.4 MIL/uL — ABNORMAL LOW (ref 3.87–5.11)
RDW: 16 % — ABNORMAL HIGH (ref 11.5–15.5)
WBC: 9.5 K/uL (ref 4.0–10.5)
nRBC: 0 % (ref 0.0–0.2)

## 2023-11-19 LAB — BASIC METABOLIC PANEL WITH GFR
Anion gap: 9 (ref 5–15)
BUN: 10 mg/dL (ref 8–23)
CO2: 24 mmol/L (ref 22–32)
Calcium: 7.6 mg/dL — ABNORMAL LOW (ref 8.9–10.3)
Chloride: 107 mmol/L (ref 98–111)
Creatinine, Ser: 0.89 mg/dL (ref 0.44–1.00)
GFR, Estimated: >60 mL/min (ref >60)
Glucose, Bld: 168 mg/dL — ABNORMAL HIGH (ref 70–99)
Potassium: 3.9 mmol/L (ref 3.5–5.1)
Sodium: 140 mmol/L (ref 135–145)

## 2023-11-19 LAB — GLUCOSE, CAPILLARY
Glucose-Capillary: 155 mg/dL — ABNORMAL HIGH (ref 70–99)
Glucose-Capillary: 177 mg/dL — ABNORMAL HIGH (ref 70–99)
Glucose-Capillary: 136 mg/dL — ABNORMAL HIGH (ref 70–99)
Glucose-Capillary: 172 mg/dL — ABNORMAL HIGH (ref 70–99)

## 2023-11-19 LAB — SEDIMENTATION RATE: Sed Rate: 38 mm/h — ABNORMAL HIGH (ref 0–22)

## 2023-11-19 LAB — MAGNESIUM: Magnesium: 1.2 mg/dL — ABNORMAL LOW (ref 1.7–2.4)

## 2023-11-19 LAB — C-REACTIVE PROTEIN: CRP: 20.3 mg/dL — ABNORMAL HIGH (ref <1.0)

## 2023-11-19 MED ORDER — SODIUM CHLORIDE 0.9 % IV SOLN: 2.0000 g | Freq: Three times a day (TID) | INTRAVENOUS | Status: DC

## 2023-11-19 MED ORDER — SODIUM CHLORIDE 0.9 % IV SOLN
2.0000 g | INTRAVENOUS | Status: DC
  Administered 2023-11-19: 2 g via INTRAVENOUS
  Filled 2023-11-19: qty 20

## 2023-11-19 MED ORDER — FUROSEMIDE 40 MG PO TABS
60.0000 mg | ORAL_TABLET | Freq: Two times a day (BID) | ORAL | Status: DC
  Administered 2023-11-19 – 2023-11-20 (×3): 60 mg via ORAL
  Filled 2023-11-19 (×3): qty 1

## 2023-11-19 MED ORDER — MAGNESIUM SULFATE 4 GM/100ML IV SOLN
4.0000 g | Freq: Once | INTRAVENOUS | Status: AC
  Administered 2023-11-19: 4 g via INTRAVENOUS
  Filled 2023-11-19: qty 100

## 2023-11-19 MED ORDER — MAGNESIUM OXIDE -MG SUPPLEMENT 400 (240 MG) MG PO TABS
400.0000 mg | ORAL_TABLET | Freq: Two times a day (BID) | ORAL | Status: DC
  Administered 2023-11-19 – 2023-11-21 (×4): 400 mg via ORAL
  Filled 2023-11-19 (×4): qty 1

## 2023-11-19 NOTE — Progress Notes (Signed)
 Mobility Specialist Progress Note:    11/19/23 1143  Mobility  Activity Dangled on edge of bed (STS. Leg Ext, curl, pumps X5)  Level of Assistance Standby assist, set-up cues, supervision of patient - no hands on  Assistive Device None  Activity Response Tolerated fair  Mobility Referral Yes  Mobility visit 1 Mobility  Mobility Specialist Start Time (ACUTE ONLY) 1143  Mobility Specialist Stop Time (ACUTE ONLY) 1152  Mobility Specialist Time Calculation (min) (ACUTE ONLY) 9 min   Pt had just ambulated w/ tech to bathroom but was willing to do some small exercise sitting on EOB. Pt able to perform a stand, leg exts, curls, and ankle pumps. C/o of leg swelling and some pain but otherwise doing fine. Left pt in bed w/ all needs met. NT notified.   Venetia Keel Mobility Specialist Please Neurosurgeon or Rehab Office at [phone]

## 2023-11-19 NOTE — Progress Notes (Signed)
 PHARMACY NOTE:  ANTIMICROBIAL RENAL DOSAGE ADJUSTMENT  Current antimicrobial regimen includes a mismatch between antimicrobial dosage and estimated renal function.  As per policy approved by the Pharmacy & Therapeutics and Medical Executive Committees, the antimicrobial dosage will be adjusted accordingly.  Current antimicrobial dosage:  cefepime  2 g IV q12h  Indication: cellulitis   Renal Function:  Estimated Creatinine Clearance: 71.7 mL/min (by C-G formula based on SCr of 0.89 mg/dL). []      On intermittent HD, scheduled: []      On CRRT    Antimicrobial dosage has been changed to:  cefepime  2 g IV q8h  Additional comments: renal function improved   Thank you for involving pharmacy in this patient's care.  Delon Sax, PharmD, BCPS Clinical Pharmacist Clinical phone for 11/19/2023 is [phone] 11/19/2023 11:42 AM

## 2023-11-19 NOTE — Progress Notes (Signed)
 VAST consult. Arrived to room. Patient just received food tray. Tech/RN notified to place consult when patient is done and ready for VAST. Powell Bowler, RN VAST

## 2023-11-19 NOTE — Assessment & Plan Note (Signed)
 11-19-2023 give IV mag today. Start po mag oxide since po lasix  going to be restarted.

## 2023-11-19 NOTE — Plan of Care (Signed)
  Problem: Metabolic: Goal: Ability to maintain appropriate glucose levels will improve Outcome: Progressing   Problem: Coping: Goal: Ability to adjust to condition or change in health will improve Outcome: Progressing   Problem: Fluid Volume: Goal: Ability to maintain a balanced intake and output will improve Outcome: Progressing   Problem: Health Behavior/Discharge Planning: Goal: Ability to identify and utilize available resources and services will improve Outcome: Progressing

## 2023-11-19 NOTE — Plan of Care (Signed)
  Problem: Fluid Volume: Goal: Ability to maintain a balanced intake and output will improve Outcome: Progressing   Problem: Metabolic: Goal: Ability to maintain appropriate glucose levels will improve Outcome: Progressing   Problem: Nutritional: Goal: Maintenance of adequate nutrition will improve Outcome: Progressing   Problem: Tissue Perfusion: Goal: Adequacy of tissue perfusion will improve Outcome: Progressing

## 2023-11-19 NOTE — TOC CM/SW Note (Addendum)
 Transition of Care [facility]) - Inpatient Brief Assessment   Patient Details  Name: TAKERRA Perry MRN: [id number] Date of Birth: April 12, 1951  [facility] of [facility] CM/SW Contact:    Andrea Barnie Rama, RN Phone Number: 11/19/2023, 2:20 PM   Clinical Narrative: From home with son and neice, has PCP , Andrea Perry,  and has  insurance on file, states has no HH services in place at this time, has a old walker that was her mothers at home.  States family member will transport them home at [employer] and family is support system, states gets medications from [facility] at [facility] self ambulatory with walker sometimes.   She would like to have a new rolling walker with the two wheels on the front.  She does not have a preference which agency supplies the walker.  NCM made referral to Andrea Perry with [employer], this will be delivered to patient's room.    Transition of Care Asessment: Insurance and Status: Insurance coverage has been reviewed Patient has primary care physician: Yes Home environment has been reviewed: home with son and neice Prior level of function:: ambulatory , uses walker when out Prior/Current Home Services: Current home services (walker (old))   Readmission risk has been reviewed: Yes Transition of care needs: no transition of care needs at this time

## 2023-11-19 NOTE — Progress Notes (Signed)
 PROGRESS NOTE    Andrea Perry  FMW:[id number] DOB: Dec 29, 1950 DOA: 11/17/2023 PCP: [name], MD  Subjective: Pt seen and examined. Stable. C/o of swelling in her lower legs. Scr back to normal. WBC is now 9.5.  was 16.6K on admission.  ESR elevated to 38 and CRP 20.3  Pt has been afebrile. Discussed with pharmacy. Will downgrade IV abx to Rocephin  2 gram daily.  (Had been on Cefepime  since admission).   Hospital Course: ED TRIAGE note:  Pt present with a rash to the rt leg x yesterday. Pt denies itching, burning and pain. States it is hot to the touch. Pts niece states she has had a fever today and has applied cold compresses to body.      HPI:  Andrea Perry is a 73 y.o. female with medical history significant of  persistent atrial fibrillation, COPD, nephrolithiasis, pulmonary hypertension, essential hypertension, obstructive sleep apnea not on CPAP, anxiety, depression, DM type II, GERD, history of hemorrhoid and hematochezia, hyperlipidemia, overactive bladder and morbid obesity who has been present emergency department complaining of fever, weakness and rash of the right lower extremity concern for infection.  Patient reported that she has recently noticed rash of her right leg.  Also feeling shortness of breath, fever and having some weakness as well.  At home she has fever 101 F.  Patient was evaluated urgent care and concern for sepsis referred to emergency department for further evaluation.  Patient also complaining of left-sided flank pain and history of renal stone.   Patient reported that she has bilateral lower extremities edema which is chronic in nature however recently noticed right lower extremity redness and left-sided hip area rash as well.   Patient also reported orthopnea and dyspnea which is chronic in nature.   Patient denies any productive cough, nausea, vomiting and diarrhea.  Denies any UTI symptoms.   In the ED code sepsis has been activated.  Patient is  receiving total 3 liter of LR bolus also received vancomycin , ceftriaxone  and cefepime .   Hospitalist has been consulted for further eval for management of sepsis in the setting of right  lower extremity cellulitis, hyponatremia, hypokalemia and elevated BNP.  Significant Events: Admitted 11/17/2023 for sepsis due to cellulitis   Admission Labs: WBC 16.6, HgB 12.1, plt 187 BNP 679 Na 132, K 3.2, CO2 of 20, BUN 14, Scr 1.58, glu 236 T prot 5.9, alb 2.7, AST 31, ALT 16, alk phos 60, T. Bili 1.5 Lactic acid 4.9 Covid/flu/rsv UA hazy, HgB large, neg nitrite, neg LE, RBC >50, bacteria many A1c 6.0%  Admission Imaging Studies: CXR No active cardiopulmonary disease.  CT chest/abd/pelvis No acute cardiopulmonary disease. Three-vessel coronary artery disease, aortic atherosclerosis. 11 mm right renal pelvic stone without hydronephrosis. Perinephric stranding noted. Findings are stable since prior study. Aortic atherosclerosis. No acute findings in the abdomen or pelvis.  Significant Labs:   Significant Imaging Studies: Echo shows LVEF 60 to 65%   Antibiotic Therapy: Anti-infectives (From admission, onward)    Start     Dose/Rate Route Frequency Ordered Stop   11/18/23 1000  ceFEPIme  (MAXIPIME ) 2 g in sodium chloride  0.9 % 100 mL IVPB        2 g 200 mL/hr over 30 Minutes Intravenous Every 12 hours 11/18/23 0113     11/18/23 0112  vancomycin  variable dose per unstable renal function (pharmacist dosing)         Does not apply See admin instructions 11/18/23 0113  11/17/23 2245  vancomycin  (VANCOREADY) IVPB 2000 mg/400 mL        2,000 mg 200 mL/hr over 120 Minutes Intravenous  Once 11/17/23 2234 11/18/23 0109   11/17/23 2245  ceFEPIme  (MAXIPIME ) 2 g in sodium chloride  0.9 % 100 mL IVPB        2 g 200 mL/hr over 30 Minutes Intravenous STAT 11/17/23 2237 11/17/23 2343   11/17/23 2200  cefTRIAXone  (ROCEPHIN ) 2 g in sodium chloride  0.9 % 100 mL IVPB        2 g 200 mL/hr over 30 Minutes  Intravenous Once 11/17/23 2158 11/17/23 2228       Procedures:   Consultants:     Assessment and Plan: * Sepsis due to cellulitis (HCC) 11-18-2023 sepsis present on admission. Lactic acid elevated at 2.2, WBC 16.6, HR 108. Due to cellulitis right foot. Did not have septic shock.  Cellulitis of right leg 11-18-2023 right leg/foot with erythema and some edema. Continue with IV cefepime  and vanco. Check ESR and CRP in AM. Awaiting blood cx  11-19-2023  C/o of swelling in her lower legs. Scr back to normal. WBC is now 9.5.  was 16.6K on admission. ESR elevated to 38 and CRP 20.3 Pt has been afebrile. Discussed with pharmacy. Will downgrade IV abx to Rocephin  2 gram daily.  (Had been on Cefepime  since admission).  Pt with less erythema. Just edema of her legs today.  Based on serial pictures, her cellulitis is improving. Plan for 2 more days of IV abx and discharge to home on Sunday with po abx.  Will restart lasix 60 mg bid to help with edema. Likely due to large amounts of IVF she received for her sepsis(present on admission).  11-17-2023 11-18-2023 11-19-2023                Acute kidney injury superimposed on stage 3a chronic kidney disease (HCC) - baseline scr 0.9 - 1.1 11-18-2023 admission Scr of 1.58.  baseline scr 0.9 - 1.1. holding diuretics, ACE/ARB for now. Repeat BMP in AM.  11-19-2023 resolved. Scr down to 0.89  Ringworm of body 11-18-2023 on left thigh. Add antifungal cream.     08 -04-2023 stable  Elevated brain natriuretic peptide (BNP) level 11-18-2023 does not appear volume overloaded at this time. Echo shows LVEF 60 to 65%   11-19-2023 stable  Hypokalemia 11-18-2023 replete with oral Kcl. Repeat BMP in AM.  11-19-2023 stable. Resolved. K 3.9 today.  Obstructive sleep apnea 11-18-2023 stable.  11-19-2023 stable  Persistent atrial fibrillation (HCC) 11-18-2023 on Eliquis  and lopressor  12.5 mg bid  11-19-2023 stable  Non-insulin  dependent type 2  diabetes mellitus (HCC) 11-18-2023 on SSI. A1C 6.0% indicating good outpatient control  11-19-2023 stable. CBG acceptable ranges  Essential hypertension 11-18-2023 on lopressor  12.5 mg bid for rate control/BP control. Holding lasix , avapro  due to AKI.  11-19-2023 continue lopressor  12.5 mg bid. Restarting lasix  60 mg bid due to LE edema from all the IVF she received in the ER. Her AK has resolved. Continue to hold ARB.  GAD (generalized anxiety disorder) 11-18-2023 continue lexapro  and prn ativan  po.  11-19-2023 stable  Hyperlipidemia 11-18-2023 on crestor  40 mg daily.  11-19-2023 stable  Acquired hypothyroidism 11-18-2023 continue synthroid  50 mcg daily.  11-19-2023 stable  Obesity, Class III, BMI 40-49.9 (morbid obesity) Body mass index is 48.47 kg/m.   Hypomagnesemia 11-19-2023 give IV mag today. Start po mag oxide since po lasix  going to be restarted.  DVT prophylaxis: SCDs Start: 11/18/23 0103 Place TED  hose Start: 11/18/23 0103 apixaban  (ELIQUIS ) tablet 5 mg     Code Status: Full Code Family Communication: no family at bedside. She is decisional. Disposition Plan: return home Reason for continuing need for hospitalization: remains on IV Abx.  Objective: Vitals:   11/19/23 0500 11/19/23 0714 11/19/23 0749 11/19/23 1017  BP:  124/80  119/65  Pulse:  90  (!) 40  Resp:  18    Temp:  97.7 F (36.5 C)  97.9 F (36.6 C)  TempSrc:  Oral  Oral  SpO2:  96% 95% 98%  Weight: 117.9 kg     Height:        Intake/Output Summary (Last 24 hours) at 11/19/2023 1425 Last data filed at 11/19/2023 1300 Gross per 24 hour  Intake 1346.69 ml  Output --  Net 1346.69 ml   Filed Weights   11/17/23 2023 11/19/23 0500  Weight: 120.2 kg 117.9 kg    Examination:  Physical Exam  Data Reviewed: I have personally reviewed following labs and imaging studies  CBC: Recent Labs  Lab 11/17/23 2038 11/18/23 0511 11/19/23 0251  WBC 16.6* 15.1* 9.5  NEUTROABS 14.8*  --  7.4   HGB 12.1 10.1* 10.5*  HCT 36.2 30.3* 31.7*  MCV 91.6 92.4 93.2  PLT 187 171 166   Basic Metabolic Panel: Recent Labs  Lab 11/17/23 2038 11/18/23 0511 11/19/23 0251  NA 132* 135 140  K 3.2* 3.3* 3.9  CL 97* 103 107  CO2 20* 25 24  GLUCOSE 236* 182* 168*  BUN 14 13 10   CREATININE 1.58* 1.17* 0.89  CALCIUM  7.5* 7.2* 7.6*  MG  --   --  1.2*   GFR: Estimated Creatinine Clearance: 71.7 mL/min (by C-G formula based on SCr of 0.89 mg/dL). Liver Function Tests: Recent Labs  Lab 11/17/23 2038  AST 31  ALT 16  ALKPHOS 60  BILITOT 1.5*  PROT 5.9*  ALBUMIN 2.7*   BNP (last 3 results) Recent Labs    12/17/22 1727 11/17/23 2038  BNP 204.2* 679.9*   HbA1C: Recent Labs    11/18/23 0200  HGBA1C 6.0*   CBG: Recent Labs  Lab 11/18/23 1238 11/18/23 1641 11/18/23 2108 11/19/23 0622 11/19/23 1041  GLUCAP 121* 160* 137* 155* 177*   Sepsis Labs: Recent Labs  Lab 11/17/23 2214 11/18/23 0042 11/18/23 0200 11/18/23 0511  LATICACIDVEN 4.9* 4.8* 2.2* 2.0*    Recent Results (from the past 240 hours)  Resp panel by RT-PCR (RSV, Flu A&B, Covid) Anterior Nasal Swab     Status: None   Collection Time: 11/17/23  9:11 PM   Specimen: Anterior Nasal Swab  Result Value Ref Range Status   SARS Coronavirus 2 by RT PCR NEGATIVE NEGATIVE Final   Influenza A by PCR NEGATIVE NEGATIVE Final   Influenza B by PCR NEGATIVE NEGATIVE Final    Comment: (NOTE) The Xpert Xpress SARS-CoV-2/FLU/RSV plus assay is intended as an aid in the diagnosis of influenza from Nasopharyngeal swab specimens and should not be used as a sole basis for treatment. Nasal washings and aspirates are unacceptable for Xpert Xpress SARS-CoV-2/FLU/RSV testing.  Fact Sheet for Patients: BloggerCourse.com  Fact Sheet for Healthcare Providers: SeriousBroker.it  This test is not yet approved or cleared by the [place]  FDA and has been authorized for  detection and/or diagnosis of SARS-CoV-2 by FDA under an Emergency Use Authorization (EUA). This EUA will remain in effect (meaning this test can be used) for the duration of the COVID-19 declaration under Section 564(b)(1) of  the Act, 21 U.S.C. section 360bbb-3(b)(1), unless the authorization is terminated or revoked.     Resp Syncytial Virus by PCR NEGATIVE NEGATIVE Final    Comment: (NOTE) Fact Sheet for Patients: BloggerCourse.com  Fact Sheet for Healthcare Providers: SeriousBroker.it  This test is not yet approved or cleared by the [place]  FDA and has been authorized for detection and/or diagnosis of SARS-CoV-2 by FDA under an Emergency Use Authorization (EUA). This EUA will remain in effect (meaning this test can be used) for the duration of the COVID-19 declaration under Section 564(b)(1) of the Act, 21 U.S.C. section 360bbb-3(b)(1), unless the authorization is terminated or revoked.  Performed at [facility], [address], [place], [place] [address]   Blood Culture (routine x 2)     Status: None (Preliminary result)   Collection Time: 11/17/23 10:15 PM   Specimen: BLOOD RIGHT FOREARM  Result Value Ref Range Status   Specimen Description BLOOD RIGHT FOREARM  Final   Special Requests   Final    BOTTLES DRAWN AEROBIC AND ANAEROBIC Blood Culture adequate volume   Culture   Final    NO GROWTH 2 DAYS Performed at [facility], [address] [place], [place] [address]    Report Status PENDING  Incomplete  Blood Culture (routine x 2)     Status: None (Preliminary result)   Collection Time: 11/17/23 10:18 PM   Specimen: BLOOD  Result Value Ref Range Status   Specimen Description BLOOD LEFT ANTECUBITAL  Final   Special Requests   Final    BOTTLES DRAWN AEROBIC AND ANAEROBIC Blood Culture adequate volume   Culture   Final    NO GROWTH 2 DAYS Performed at [facility], [address]  [place], [place] [address]    Report Status PENDING  Incomplete     Radiology Studies: ECHOCARDIOGRAM COMPLETE Result Date: 11/18/2023    ECHOCARDIOGRAM REPORT   Patient Name:   Andrea Perry Date of Exam: 11/18/2023 Medical Rec #:  [id number]     Height:       62.0 in Accession #:    [id number]    Weight:       265.0 lb Date of Birth:  March 12, 1951     BSA:          2.155 m Patient Age:    72 years      BP:           100/64 mmHg Patient Gender: F             HR:           92 bpm. Exam Location:  Inpatient Procedure: 2D Echo, Cardiac Doppler, Color Doppler and Intracardiac            Opacification Agent (Both Spectral and Color Flow Doppler were            utilized during procedure). Indications:    Elevated BNP  History:        Patient has prior history of Echocardiogram examinations. Risk                 Factors:Hypertension.  Sonographer:    Vella Key Referring Phys: SUBRINA SUNDIL IMPRESSIONS  1. Left ventricular ejection fraction, by estimation, is 60 to 65%. The left ventricle has normal function. The left ventricle has no regional wall motion abnormalities. Left ventricular diastolic function could not be evaluated.  2. Right ventricular systolic function is mildly reduced. The right ventricular size  is moderately enlarged.  3. Right atrial size was moderately dilated.  4. The mitral valve is normal in structure. No evidence of mitral valve regurgitation. No evidence of mitral stenosis.  5. Tricuspid valve regurgitation is moderate to severe.  6. The aortic valve is tricuspid. There is mild calcification of the aortic valve. Aortic valve regurgitation is not visualized. No aortic stenosis is present.  7. The inferior vena cava is dilated in size with <50% respiratory variability, suggesting right atrial pressure of 15 mmHg. Conclusion(s)/Recommendation(s): Echo limited due to poor sound wave transmission. Findings suggestive of elevated R-sided pressures. FINDINGS  Left Ventricle: Left ventricular ejection  fraction, by estimation, is 60 to 65%. The left ventricle has normal function. The left ventricle has no regional wall motion abnormalities. The left ventricular internal cavity size was normal in size. There is  no left ventricular hypertrophy. Left ventricular diastolic function could not be evaluated due to atrial fibrillation. Left ventricular diastolic function could not be evaluated. Right Ventricle: The right ventricular size is moderately enlarged. No increase in right ventricular wall thickness. Right ventricular systolic function is mildly reduced. Left Atrium: Left atrial size was normal in size. Right Atrium: Right atrial size was moderately dilated. Pericardium: There is no evidence of pericardial effusion. Mitral Valve: The mitral valve is normal in structure. No evidence of mitral valve regurgitation. No evidence of mitral valve stenosis. Tricuspid Valve: The tricuspid valve is normal in structure. Tricuspid valve regurgitation is moderate to severe. No evidence of tricuspid stenosis. Aortic Valve: The aortic valve is tricuspid. There is mild calcification of the aortic valve. Aortic valve regurgitation is not visualized. No aortic stenosis is present. Pulmonic Valve: The pulmonic valve was normal in structure. Pulmonic valve regurgitation is not visualized. No evidence of pulmonic stenosis. Aorta: The aortic root is normal in size and structure. Venous: The inferior vena cava is dilated in size with less than 50% respiratory variability, suggesting right atrial pressure of 15 mmHg. IAS/Shunts: No atrial level shunt detected by color flow Doppler.  LEFT VENTRICLE PLAX 2D LVIDd:         4.90 cm      Diastology LVIDs:         3.30 cm      LV e' medial:    8.70 cm/s LV PW:         1.00 cm      LV E/e' medial:  9.8 LV IVS:        1.00 cm      LV e' lateral:   8.81 cm/s LVOT diam:     1.60 cm      LV E/e' lateral: 9.7 LVOT Area:     2.01 cm  LV Volumes (MOD) LV vol d, MOD A2C: 122.0 ml LV vol d, MOD A4C:  142.0 ml LV vol s, MOD A2C: 40.8 ml LV vol s, MOD A4C: 57.0 ml LV SV MOD A2C:     81.2 ml LV SV MOD A4C:     142.0 ml LV SV MOD BP:      83.9 ml RIGHT VENTRICLE RV Basal diam:  4.20 cm RV S prime:     11.10 cm/s TAPSE (M-mode): 1.4 cm LEFT ATRIUM             Index        RIGHT ATRIUM           Index LA diam:        4.00 cm 1.86 cm/m   RA Area:  25.30 cm LA Vol (A2C):   68.9 ml 31.98 ml/m  RA Volume:   92.60 ml  42.98 ml/m LA Vol (A4C):   39.3 ml 18.24 ml/m LA Biplane Vol: 53.9 ml 25.02 ml/m   AORTA Ao Root diam: 2.80 cm Ao Asc diam:  3.10 cm MITRAL VALVE               TRICUSPID VALVE MV Area (PHT): 4.15 cm    TV Peak grad:   9.6 mmHg MV Decel Time: 183 msec    TV Vmax:        1.55 m/s MV E velocity: 85.40 cm/s MV A velocity: 21.00 cm/s  SHUNTS MV E/A ratio:  4.07        Systemic Diam: 1.60 cm Toribio Fuel MD Electronically signed by Toribio Fuel MD Signature Date/Time: 11/18/2023/6:04:53 PM    Final    CT CHEST ABDOMEN PELVIS WO CONTRAST Result Date: 11/17/2023 CLINICAL DATA:  SOB, FLANK PAIN, SEPTIC EXAM: CT CHEST, ABDOMEN AND PELVIS WITHOUT CONTRAST TECHNIQUE: Multidetector CT imaging of the chest, abdomen and pelvis was performed following the standard protocol without IV contrast. RADIATION DOSE REDUCTION: This exam was performed according to the departmental dose-optimization program which includes automated exposure control, adjustment of the mA and/or kV according to patient size and/or use of iterative reconstruction technique. COMPARISON:  07/01/2023 FINDINGS: CT CHEST FINDINGS Cardiovascular: Heart is normal size. Aorta is normal caliber. Three vessel coronary artery disease and moderate aortic atherosclerosis. Mediastinum/Nodes: No mediastinal, hilar, or axillary adenopathy. Trachea and esophagus are unremarkable. Thyroid  unremarkable. Lungs/Pleura: Lungs are clear. No focal airspace opacities or suspicious nodules. No effusions. Musculoskeletal: Chest wall soft tissues are  unremarkable. No acute bony abnormality. CT ABDOMEN PELVIS FINDINGS Hepatobiliary: No focal hepatic abnormality. Gallbladder unremarkable. Pancreas: No focal abnormality or ductal dilatation. Spleen: No focal abnormality.  Normal size. Adrenals/Urinary Tract: Adrenal glands normal. 11 mm right renal pelvic stone. No hydronephrosis. Mild perinephric stranding. Findings are similar to prior study. No stones or hydronephrosis on the left. Urinary bladder unremarkable. Stomach/Bowel: Stomach, large and small bowel grossly unremarkable. Vascular/Lymphatic: Aortic atherosclerosis. No evidence of aneurysm or adenopathy. Reproductive: Prior hysterectomy.  No adnexal masses. Other: No free fluid or free air. Musculoskeletal: No acute bony abnormality. IMPRESSION: No acute cardiopulmonary disease. Three-vessel coronary artery disease, aortic atherosclerosis. 11 mm right renal pelvic stone without hydronephrosis. Perinephric stranding noted. Findings are stable since prior study. Aortic atherosclerosis. No acute findings in the abdomen or pelvis. Electronically Signed   By: Franky Crease M.D.   On: 11/17/2023 23:35   DG Chest 2 View Result Date: 11/17/2023 CLINICAL DATA:  Shortness of breath. EXAM: CHEST - 2 VIEW COMPARISON:  12/17/2022. FINDINGS: The heart is borderline enlarged and mediastinal contours are within normal limits. There is atherosclerotic calcification of the aorta. No consolidation, effusion, or pneumothorax is seen. Degenerative changes are present in the thoracic spine. No acute osseous abnormality. IMPRESSION: No active cardiopulmonary disease. Electronically Signed   By: Leita Birmingham M.D.   On: 11/17/2023 20:47    Scheduled Meds:  apixaban   5 mg Oral BID   budesonide -glycopyrrolate -formoterol   2 puff Inhalation BID   escitalopram   20 mg Oral Daily   furosemide   60 mg Oral BID   insulin  aspart  0-5 Units Subcutaneous QHS   insulin  aspart  0-6 Units Subcutaneous TID WC   levothyroxine   50 mcg  Oral Q0600   loratadine   10 mg Oral Daily   magnesium  oxide  400 mg Oral BID  metoprolol  tartrate  12.5 mg Oral BID   pantoprazole   40 mg Oral Daily   rosuvastatin   40 mg Oral Daily   sodium chloride  flush  3 mL Intravenous Q12H   sodium chloride  flush  3 mL Intravenous Q12H   terbinafine    Topical BID   Continuous Infusions:  cefTRIAXone  (ROCEPHIN )  IV     magnesium  sulfate bolus IVPB 4 g (11/19/23 1331)   vancomycin  1,250 mg (11/18/23 2219)     LOS: 1 day   Time spent: 60 minutes  Camellia Door, DO  [facility]  11/19/2023, 2:25 PM

## 2023-11-20 DIAGNOSIS — L0390 Cellulitis, unspecified: Secondary | ICD-10-CM | POA: Diagnosis not present

## 2023-11-20 DIAGNOSIS — A419 Sepsis, unspecified organism: Secondary | ICD-10-CM | POA: Diagnosis not present

## 2023-11-20 LAB — BASIC METABOLIC PANEL WITH GFR
Anion gap: 10 (ref 5–15)
BUN: 11 mg/dL (ref 8–23)
CO2: 27 mmol/L (ref 22–32)
Calcium: 8 mg/dL — ABNORMAL LOW (ref 8.9–10.3)
Chloride: 101 mmol/L (ref 98–111)
Creatinine, Ser: 0.95 mg/dL (ref 0.44–1.00)
GFR, Estimated: >60 mL/min (ref >60)
Glucose, Bld: 144 mg/dL — ABNORMAL HIGH (ref 70–99)
Potassium: 3.7 mmol/L (ref 3.5–5.1)
Sodium: 138 mmol/L (ref 135–145)

## 2023-11-20 LAB — CBC WITH DIFFERENTIAL/PLATELET
Abs Granulocyte: 3.6 K/uL (ref 1.5–6.5)
Abs Immature Granulocytes: 0.04 K/uL (ref 0.00–0.07)
Basophils Absolute: 0.1 K/uL (ref 0.0–0.1)
Basophils Relative: 1 %
Eosinophils Absolute: 0.2 K/uL (ref 0.0–0.5)
Eosinophils Relative: 3 %
HCT: 34.6 % — ABNORMAL LOW (ref 36.0–46.0)
Hemoglobin: 11.3 g/dL — ABNORMAL LOW (ref 12.0–15.0)
Immature Granulocytes: 1 %
Lymphocytes Relative: 28 %
Lymphs Abs: 1.6 K/uL (ref 0.7–4.0)
MCH: 30.1 pg (ref 26.0–34.0)
MCHC: 32.7 g/dL (ref 30.0–36.0)
MCV: 92.3 fL (ref 80.0–100.0)
Monocytes Absolute: 0.4 K/uL (ref 0.1–1.0)
Monocytes Relative: 7 %
Neutro Abs: 3.6 K/uL (ref 1.7–7.7)
Neutrophils Relative %: 60 %
Platelets: 189 K/uL (ref 150–400)
RBC: 3.75 MIL/uL — ABNORMAL LOW (ref 3.87–5.11)
RDW: 15.6 % — ABNORMAL HIGH (ref 11.5–15.5)
WBC: 5.9 K/uL (ref 4.0–10.5)
nRBC: 0 % (ref 0.0–0.2)

## 2023-11-20 LAB — GLUCOSE, CAPILLARY
Glucose-Capillary: 120 mg/dL — ABNORMAL HIGH (ref 70–99)
Glucose-Capillary: 157 mg/dL — ABNORMAL HIGH (ref 70–99)
Glucose-Capillary: 195 mg/dL — ABNORMAL HIGH (ref 70–99)
Glucose-Capillary: 158 mg/dL — ABNORMAL HIGH (ref 70–99)

## 2023-11-20 LAB — MAGNESIUM: Magnesium: 1.8 mg/dL (ref 1.7–2.4)

## 2023-11-20 MED ORDER — METFORMIN HCL ER 500 MG PO TB24
500.0000 mg | ORAL_TABLET | Freq: Every day | ORAL | Status: DC
  Administered 2023-11-21: 500 mg via ORAL
  Filled 2023-11-20 (×2): qty 1

## 2023-11-20 MED ORDER — DOXYCYCLINE HYCLATE 100 MG PO TABS
100.0000 mg | ORAL_TABLET | Freq: Two times a day (BID) | ORAL | Status: DC
  Administered 2023-11-20 – 2023-11-21 (×3): 100 mg via ORAL
  Filled 2023-11-20 (×3): qty 1

## 2023-11-20 MED ORDER — METOPROLOL TARTRATE 25 MG PO TABS
25.0000 mg | ORAL_TABLET | Freq: Two times a day (BID) | ORAL | Status: DC
  Administered 2023-11-20: 25 mg via ORAL
  Filled 2023-11-20: qty 1

## 2023-11-20 MED ORDER — ESTRADIOL 0.5 MG PO TABS
1.0000 mg | ORAL_TABLET | Freq: Every day | ORAL | Status: DC
Start: 2023-11-20 — End: 2023-11-21
  Administered 2023-11-20 – 2023-11-21 (×2): 1 mg via ORAL
  Filled 2023-11-20 (×2): qty 2
  Filled 2023-11-20: qty 1

## 2023-11-20 NOTE — Progress Notes (Signed)
 Mobility Specialist Progress Note:   11/20/23 0915  Mobility  Activity Ambulated with assistance  Level of Assistance Contact guard assist, steadying assist  Assistive Device Front wheel walker  Distance Ambulated (ft) 25 ft  Activity Response Tolerated well  Mobility Referral Yes  Mobility visit 1 Mobility  Mobility Specialist Start Time (ACUTE ONLY) 0915  Mobility Specialist Stop Time (ACUTE ONLY) 0930  Mobility Specialist Time Calculation (min) (ACUTE ONLY) 15 min   Session limited d/t elevated HR (140s) with short room distance. Pt able to ambulate with only minG assist throughout. Back in bed with all needs met. HR low 100s.  Therisa Rana Mobility Specialist Please contact via SecureChat or  Rehab office at [phone]

## 2023-11-20 NOTE — Plan of Care (Signed)
  Problem: Skin Integrity: Goal: Risk for impaired skin integrity will decrease Outcome: Progressing   Problem: Tissue Perfusion: Goal: Adequacy of tissue perfusion will improve Outcome: Progressing   Problem: Clinical Measurements: Goal: Will remain free from infection Outcome: Progressing Goal: Diagnostic test results will improve Outcome: Progressing Goal: Respiratory complications will improve Outcome: Progressing   Problem: Activity: Goal: Risk for activity intolerance will decrease Outcome: Progressing   Problem: Safety: Goal: Ability to remain free from injury will improve Outcome: Progressing

## 2023-11-20 NOTE — Progress Notes (Signed)
 [facility] ROUNDING NOTE Andrea Perry FMW:[id number]  DOB: 01/14/1951  DOA: 11/17/2023  PCP: [name], MD  11/20/2023,6:31 AM  LOS: 2 days    Code Status: Full code     from: Full current Dispo: Likely home in 40    73 year old white female known history of L3-L4, L4-L5 decompression in 2017, permanent A-fib CHA2DS2-VASc >3 failed outpatient DCCV previously on Eliquis  previous HFpEF Class III obesity BMI >46 mild OSA not tolerating CPAP previously Stage II COPD DM 2  HTN Hypothyroidism, depression on meds previously Last admitted to hospital 04/20/2022 through 04/26/2022 with RSV exacerbating her underlying COPD  7/30 seen by NP at urgent care with splotchy rash spreading to thigh unsure how it started-was quite hypotensive and found to have a fever-had possible heat exhaustion because of and not working at home [facility] 16 lactic acid 4.9 potassium 3.2 [id number] elevated above baseline1 1.5EKG A-fib heart rate 120  CT ABD pelvis-right pelvic stone withoutNephrosis - received in ED 3 L fluid    Plan   sepsis 2/2 cellulitis not septic shock-cellulitis right foot source Improved on exam today-switching ceftriaxone  and vancomycin  to doxycycline  monotherapy as blood cultures are negative Pain seems pretty well-controlled on Tylenol   Ringworm rash to left.today Antifungal cream 1% Lamisil  twice daily to area and monitor as an outpatient  AKI superimposed on CKD 3A baseline 0.9 Hypokalemia Had AKI on admission, hypokalemia on admission likely secondary to ATN as well as ARB and was hydrated during hospital stay  History HF PEF  received Lasix  60 twice daily and is still little net positive so continue that dose today Expected discharge meds would be Lasix  40 twice daily, amlodipine  10 as well as irbesartan  150 have been held from admission.  Permanent A-fib Some bigeminy and SVT on monitors so increasing dose of Lopressor  from 12.5 twice daily to 25 twice daily and would keep at that dose at  discharge can be uptitrated in the outpatient  DM TY 2 A1c  Continues on sliding scale coverage alone-metformin  from home has been held I will resume that from tomorrow  Bipolar Continue Lexapro  20 lorazepam  1 twice daily  Hypothyroidism OSA/BMI class III      Data Reviewed:    BCx 2 no growth to date Urine culture not obtained on admission Sodium 138 potassium 3.7 BUN/creatinine 11/0.9 CBC pending CBG ranging 128-170  DVT prophylaxis: Apixaban   Status is: Inpatient      Subjective: Looks well feels fair no distress pain is improved in right lower extremity swelling and redness have receded she seems happy No chest pain No nausea    Objective + exam Vitals:   11/19/23 1937 11/19/23 2153 11/20/23 0037 11/20/23 0414  BP: 130/85 131/74 (!) 140/76 118/61  Pulse: 98 94 84 81  Resp: 19  18 18   Temp: 97.8 F (36.6 C)  97.8 F (36.6 C) 97.7 F (36.5 C)  TempSrc: Oral  Oral Oral  SpO2: 97%  98% 96%  Weight:    115 kg  Height:       Filed Weights   11/17/23 2023 11/19/23 0500 11/20/23 0414  Weight: 120.2 kg 117.9 kg 115 kg     Examination: EOMI NCAT no focal deficit looks about stated age no icterus no pallor Chest clear S1-S2 no murmurs some to bigeminy NSVT on monitor   abdomen soft no rebound no guarding CTAB no added sound ROM intact Power 5/5     Scheduled Meds:  apixaban   5 mg Oral  BID   budesonide -glycopyrrolate -formoterol   2 puff Inhalation BID   escitalopram   20 mg Oral Daily   furosemide   60 mg Oral BID   insulin  aspart  0-5 Units Subcutaneous QHS   insulin  aspart  0-6 Units Subcutaneous TID WC   levothyroxine   50 mcg Oral Q0600   loratadine   10 mg Oral Daily   magnesium  oxide  400 mg Oral BID   metoprolol  tartrate  12.5 mg Oral BID   pantoprazole   40 mg Oral Daily   rosuvastatin   40 mg Oral Daily   sodium chloride  flush  3 mL Intravenous Q12H   sodium chloride  flush  3 mL Intravenous Q12H   terbinafine    Topical BID   Continuous  Infusions:  cefTRIAXone  (ROCEPHIN )  IV 2 g (11/19/23 1735)   vancomycin  1,250 mg (11/19/23 2218)    Time 35  Jai-Gurmukh Mayumi Summerson, MD  [facility]

## 2023-11-21 DIAGNOSIS — A419 Sepsis, unspecified organism: Secondary | ICD-10-CM | POA: Diagnosis not present

## 2023-11-21 DIAGNOSIS — L0390 Cellulitis, unspecified: Secondary | ICD-10-CM | POA: Diagnosis not present

## 2023-11-21 LAB — CBC WITH DIFFERENTIAL/PLATELET
Abs Immature Granulocytes: 0.06 K/uL (ref 0.00–0.07)
Basophils Absolute: 0.1 K/uL (ref 0.0–0.1)
Basophils Relative: 1 %
Eosinophils Absolute: 0.1 K/uL (ref 0.0–0.5)
Eosinophils Relative: 2 %
HCT: 36.2 % (ref 36.0–46.0)
Hemoglobin: 11.6 g/dL — ABNORMAL LOW (ref 12.0–15.0)
Immature Granulocytes: 1 %
Lymphocytes Relative: 27 %
Lymphs Abs: 1.7 K/uL (ref 0.7–4.0)
MCH: 30 pg (ref 26.0–34.0)
MCHC: 32 g/dL (ref 30.0–36.0)
MCV: 93.5 fL (ref 80.0–100.0)
Monocytes Absolute: 0.5 K/uL (ref 0.1–1.0)
Monocytes Relative: 9 %
Neutro Abs: 3.6 K/uL (ref 1.7–7.7)
Neutrophils Relative %: 60 %
Platelets: 252 K/uL (ref 150–400)
RBC: 3.87 MIL/uL (ref 3.87–5.11)
RDW: 15.4 % (ref 11.5–15.5)
WBC: 6.1 K/uL (ref 4.0–10.5)
nRBC: 0 % (ref 0.0–0.2)

## 2023-11-21 LAB — BASIC METABOLIC PANEL WITH GFR
Anion gap: 10 (ref 5–15)
BUN: 16 mg/dL (ref 8–23)
CO2: 30 mmol/L (ref 22–32)
Calcium: 8.5 mg/dL — ABNORMAL LOW (ref 8.9–10.3)
Chloride: 99 mmol/L (ref 98–111)
Creatinine, Ser: 1.21 mg/dL — ABNORMAL HIGH (ref 0.44–1.00)
GFR, Estimated: 48 mL/min — ABNORMAL LOW (ref >60)
Glucose, Bld: 149 mg/dL — ABNORMAL HIGH (ref 70–99)
Potassium: 4.1 mmol/L (ref 3.5–5.1)
Sodium: 139 mmol/L (ref 135–145)

## 2023-11-21 LAB — GLUCOSE, CAPILLARY
Glucose-Capillary: 144 mg/dL — ABNORMAL HIGH (ref 70–99)
Glucose-Capillary: 130 mg/dL — ABNORMAL HIGH (ref 70–99)

## 2023-11-21 MED ORDER — IRBESARTAN 150 MG PO TABS: 150.0000 mg | ORAL_TABLET | Freq: Every day | ORAL | 3 refills | Status: AC

## 2023-11-21 MED ORDER — DOXYCYCLINE HYCLATE 100 MG PO TABS: 100.0000 mg | ORAL_TABLET | Freq: Two times a day (BID) | ORAL | 0 refills | Status: AC

## 2023-11-21 MED ORDER — METOPROLOL TARTRATE 50 MG PO TABS: 50.0000 mg | ORAL_TABLET | Freq: Two times a day (BID) | ORAL | Status: DC

## 2023-11-21 MED ORDER — FUROSEMIDE 40 MG PO TABS
40.0000 mg | ORAL_TABLET | Freq: Two times a day (BID) | ORAL | Status: DC
  Administered 2023-11-21: 40 mg via ORAL
  Filled 2023-11-21: qty 1

## 2023-11-21 MED ORDER — METOPROLOL TARTRATE 50 MG PO TABS
75.0000 mg | ORAL_TABLET | Freq: Two times a day (BID) | ORAL | Status: DC
  Administered 2023-11-21: 75 mg via ORAL
  Filled 2023-11-21: qty 1

## 2023-11-21 NOTE — Plan of Care (Deleted)
  Problem: Tissue Perfusion: Goal: Adequacy of tissue perfusion will improve Outcome: Progressing   Problem: Education: Goal: Knowledge of General Education information will improve Description: Including pain rating scale, medication(s)/side effects and non-pharmacologic comfort measures Outcome: Progressing   Problem: Health Behavior/Discharge Planning: Goal: Ability to manage health-related needs will improve Outcome: Progressing   Problem: Elimination: Goal: Will not experience complications related to bowel motility Outcome: Progressing Goal: Will not experience complications related to urinary retention Outcome: Progressing   Problem: Skin Integrity: Goal: Risk for impaired skin integrity will decrease Outcome: Progressing

## 2023-11-21 NOTE — Discharge Summary (Signed)
 Physician Discharge Summary  Andrea Perry FMW:[id number] DOB: Nov 27, 1950 DOA: 11/17/2023  PCP: [name], MD  Admit date: 11/17/2023 Discharge date: 11/21/2023  Time spent: 27 minutes  Recommendations for Outpatient Follow-up:  Requires completion doxycycline  as an outpatient New dry weight 111 kg needs outpatient weights, Chem-12 and discussion with PCP -Suggest outpatient follow-up with [name] pulmonology for dyspnea-as well as BiPAP initiation --CC him  Discharge Diagnoses:  MAIN problem for hospitalization   Right lower extremity cellulitis nonpurulent with sepsis on admission  Please see below for itemized issues addressed in HOpsital- refer to other progress notes for clarity if needed  Discharge Condition: Improved  Diet recommendation: Heart healthy  Filed Weights   11/19/23 0500 11/20/23 0414 11/21/23 0615  Weight: 117.9 kg 115 kg 111.4 kg    History of present illness:  73 year old white female known history of L3-L4, L4-L5 decompression in 2017, permanent A-fib CHA2DS2-VASc >3 failed outpatient DCCV previously on Eliquis  previous HFpEF Class III obesity BMI >46 mild OSA not tolerating CPAP previously Stage II COPD DM 2  HTN Hypothyroidism, depression on meds previously Last admitted to hospital 04/20/2022 through 04/26/2022 with RSV exacerbating her underlying COPD   7/30 seen by NP at urgent care with splotchy rash spreading to thigh unsure how it started-was quite hypotensive and found to have a fever-had possible heat exhaustion because of and not working at home [facility] 16 lactic acid 4.9 potassium 3.2 [id number] elevated above baseline1 1.5EKG A-fib heart rate 120  CT ABD pelvis-right pelvic stone withoutNephrosis - received in ED 3 L fluid       Plan    sepsis 2/2 cellulitis not septic shock-cellulitis right foot source Improved on exam today- ceftriaxone  and vancomycin  to doxycycline  monotherapy as blood cultures are negative Pain seems pretty  well-controlled on Tylenol    Ringworm rash to left.today Antifungal cream 1% Lamisil  twice daily to area and monitor as an outpatient   AKI superimposed on CKD 3A baseline 0.9 Hypokalemia Had AKI on admission, hypokalemia on admission likely secondary to ATN as well as ARB and was hydrated during hospital stay She was subsequently diuresed under baseline weight of 119 in the office 07/2023 and is 111 kg which is her new dry weight-see below   History HF PEF Discharging on usual meds inclusive of amlodipine  10 irbesartan  150 See below discussion Also has underlying PAH probably from respiratory issues/A-fib Will go home on Lasix  40 daily and take an extra if gains weight as per her usual instructions cardiologist previously   Permanent A-fib Metoprolol  initially apparently was cut back to 12.5 twice daily given volume overload and need for diuresis I have increased her back to home dose of 100 twice daily-if she does not have any SVT on monitors later on today and her cardiac irritability decreases, she can probably go home on this dose   DM TY 2 A1c  Continues on sliding scale coverage alone-metformin  resumed-blood sugars ranging 140s 190s overall   Bipolar Continue Lexapro  20 lorazepam  1 twice daily   Hypothyroidism  OSA/BMI class III COPD/emphysema Follows with [name] in the office-should follow back up with him for initiation of BiPAP   Discharge Exam: Vitals:   11/21/23 0435 11/21/23 0736  BP: 118/72 119/83  Pulse: 70 92  Resp: 20 20  Temp: 97.9 F (36.6 C) 97.8 F (36.6 C)  SpO2: 98% 95%    Subj on day of d/c   Awake coherent pleasant no distress looks well feels well No  wheeze rales rhonchi Eating and drinking Pain is decreased in right lower extremity  General Exam on discharge  NCAT no focal deficit Neck soft supple Chest clear S1-S2 on monitors as A-fib with some runs of SVT abdomen is obese nontender Right lower extremity redness erythema rubor  are all decreased from yesterday Power 5/5  Discharge Instructions   Discharge Instructions     (HEART FAILURE PATIENTS) Call MD:  Anytime you have any of the following symptoms: 1) 3 pound weight gain in 24 hours or 5 pounds in 1 week 2) shortness of breath, with or without a dry hacking cough 3) swelling in the hands, feet or stomach 4) if you have to sleep on extra pillows at night in order to breathe.   Complete by: As directed    Call MD for:  difficulty breathing, headache or visual disturbances   Complete by: As directed    Call MD for:  temperature >100.4   Complete by: As directed    Diet - low sodium heart healthy   Complete by: As directed    Discharge instructions   Complete by: As directed    Make sure that you complete the antibiotics that we have prescribed for you -finish the course.  You will need to get labwork re-checked as an OP probably 1 weeks time--we have resumed all your usual meds--take them as u normally do See ur MD in 1-2weeks for close outpatiet follow up   Increase activity slowly   Complete by: As directed    No wound care   Complete by: As directed       Allergies as of 11/21/2023       Reactions   Tramadol Hcl Nausea And Vomiting, Other (See Comments)    mouth dryness, headache   Codeine  Nausea And Vomiting, Nausea Only   Tape Rash   Plastic tape, bandaids and ekg leads, causes redness and rash Other reaction(s): Not available   Vicodin [hydrocodone -acetaminophen ] Nausea And Vomiting        Medication List     STOP taking these medications    Potassium 99 MG Tabs       TAKE these medications    acetaminophen  500 MG tablet Commonly known as: TYLENOL  Take 500 mg by mouth 2 (two) times daily.   albuterol  (2.5 MG/3ML) 0.083% nebulizer solution Commonly known as: PROVENTIL  USE EVERY 4 HOURS WITH IPRATROPIUM   amLODipine  10 MG tablet Commonly known as: NORVASC  Take 1 tablet by mouth once daily   Breztri  Aerosphere 160-9-4.8  MCG/ACT Aero inhaler Generic drug: budesonide -glycopyrrolate -formoterol  Inhale 2 puffs into the lungs 2 (two) times daily.   cetirizine 10 MG tablet Commonly known as: ZYRTEC Take 10 mg by mouth daily.   Cholecalciferol  50 MCG (2000 UT) Tabs 1 tab by mouth once daily   dimenhyDRINATE 50 MG tablet Commonly known as: DRAMAMINE Take 50 mg by mouth every 8 (eight) hours as needed for nausea.   doxycycline  100 MG tablet Commonly known as: VIBRA -TABS Take 1 tablet (100 mg total) by mouth every 12 (twelve) hours for 3 days.   Eliquis  5 MG Tabs tablet Generic drug: apixaban  TAKE 1 TABLET BY MOUTH TWICE DAILY . APPOINTMENT REQUIRED FOR FUTURE REFILLS   escitalopram  20 MG tablet Commonly known as: LEXAPRO  Take 1 tablet (20 mg total) by mouth daily.   estradiol  1 MG tablet Commonly known as: ESTRACE  Take 1 mg by mouth daily.   ferrous sulfate  325 (65 FE) MG tablet Take 1 tablet (325  mg total) by mouth daily.   FreeStyle [employer] Use as directed daily E11.9   furosemide  40 MG tablet Commonly known as: LASIX  Take 1 tablet (40 mg total) by mouth daily.   Gemtesa 75 MG Tabs Generic drug: Vibegron Take 75 mg by mouth daily. For 28 days   glucose blood test strip Commonly known as: FREESTYLE LITE Use as instructed once daily E11.9   ipratropium-albuterol  0.5-2.5 (3) MG/3ML Soln Commonly known as: DUONEB Take 3 mLs by nebulization every 6 (six) hours as needed.   irbesartan  150 MG tablet Commonly known as: AVAPRO  Take 1 tablet (150 mg total) by mouth daily.   Lancets Misc Use as directed once daily E11.9   levothyroxine  50 MCG tablet Commonly known as: SYNTHROID  Take 1 tablet (50 mcg total) by mouth daily.   LORazepam  1 MG tablet Commonly known as: ATIVAN  Take 1 tablet by mouth twice daily as needed for anxiety   Magnesium  Oxide -Mg Supplement 500 MG Caps 1 tab by mouth twice per day What changed:  how much to take how to take this when to take this    metFORMIN  500 MG 24 hr tablet Commonly known as: GLUCOPHAGE -XR TAKE 2 TABLETS BY MOUTH ONCE DAILY WITH BREAKFAST   metoprolol  tartrate 100 MG tablet Commonly known as: LOPRESSOR  Take 1 tablet by mouth twice daily   pantoprazole  40 MG tablet Commonly known as: PROTONIX  Take 1 tablet (40 mg total) by mouth daily.   rosuvastatin  40 MG tablet Commonly known as: CRESTOR  Take 1 tablet (40 mg total) by mouth daily.   TETRAHYDROZOLINE HCL OP Place 1 drop into both eyes 2 (two) times daily as needed (for redness).   tolterodine  4 MG 24 hr capsule Commonly known as: DETROL  LA Take 1 capsule (4 mg total) by mouth daily.               Durable Medical Equipment  (From admission, onward)           Start     Ordered   11/19/23 1425  For home use only DME Walker rolling  Once       Question Answer Comment  Walker: With 5 Inch Wheels   Patient needs a walker to treat with the following condition Debility      11/19/23 1424           Allergies  Allergen Reactions   Tramadol Hcl Nausea And Vomiting and Other (See Comments)     mouth dryness, headache   Codeine  Nausea And Vomiting and Nausea Only   Tape Rash    Plastic tape, bandaids and ekg leads, causes redness and rash Other reaction(s): Not available   Vicodin [Hydrocodone -Acetaminophen ] Nausea And Vomiting    Follow-up Information     [facility] Follow up.   Why: rolling walker Contact information: [phone]                 The results of significant diagnostics from this hospitalization (including imaging, microbiology, ancillary and laboratory) are listed below for reference.    Significant Diagnostic Studies: ECHOCARDIOGRAM COMPLETE Result Date: 11/18/2023    ECHOCARDIOGRAM REPORT   Patient Name:   Andrea Perry Date of Exam: 11/18/2023 Medical Rec #:  [id number]     Height:       62.0 in Accession #:    [id number]    Weight:       265.0 lb Date of Birth:  [birth year]     BSA:  2.155 m  Patient Age:    72 years      BP:           100/64 mmHg Patient Gender: F             HR:           92 bpm. Exam Location:  Inpatient Procedure: 2D Echo, Cardiac Doppler, Color Doppler and Intracardiac            Opacification Agent (Both Spectral and Color Flow Doppler were            utilized during procedure). Indications:    Elevated BNP  History:        Patient has prior history of Echocardiogram examinations. Risk                 Factors:Hypertension.  Sonographer:    Vella Key Referring Phys: SUBRINA SUNDIL IMPRESSIONS  1. Left ventricular ejection fraction, by estimation, is 60 to 65%. The left ventricle has normal function. The left ventricle has no regional wall motion abnormalities. Left ventricular diastolic function could not be evaluated.  2. Right ventricular systolic function is mildly reduced. The right ventricular size is moderately enlarged.  3. Right atrial size was moderately dilated.  4. The mitral valve is normal in structure. No evidence of mitral valve regurgitation. No evidence of mitral stenosis.  5. Tricuspid valve regurgitation is moderate to severe.  6. The aortic valve is tricuspid. There is mild calcification of the aortic valve. Aortic valve regurgitation is not visualized. No aortic stenosis is present.  7. The inferior vena cava is dilated in size with <50% respiratory variability, suggesting right atrial pressure of 15 mmHg. Conclusion(s)/Recommendation(s): Echo limited due to poor sound wave transmission. Findings suggestive of elevated R-sided pressures. FINDINGS  Left Ventricle: Left ventricular ejection fraction, by estimation, is 60 to 65%. The left ventricle has normal function. The left ventricle has no regional wall motion abnormalities. The left ventricular internal cavity size was normal in size. There is  no left ventricular hypertrophy. Left ventricular diastolic function could not be evaluated due to atrial fibrillation. Left ventricular diastolic function could not  be evaluated. Right Ventricle: The right ventricular size is moderately enlarged. No increase in right ventricular wall thickness. Right ventricular systolic function is mildly reduced. Left Atrium: Left atrial size was normal in size. Right Atrium: Right atrial size was moderately dilated. Pericardium: There is no evidence of pericardial effusion. Mitral Valve: The mitral valve is normal in structure. No evidence of mitral valve regurgitation. No evidence of mitral valve stenosis. Tricuspid Valve: The tricuspid valve is normal in structure. Tricuspid valve regurgitation is moderate to severe. No evidence of tricuspid stenosis. Aortic Valve: The aortic valve is tricuspid. There is mild calcification of the aortic valve. Aortic valve regurgitation is not visualized. No aortic stenosis is present. Pulmonic Valve: The pulmonic valve was normal in structure. Pulmonic valve regurgitation is not visualized. No evidence of pulmonic stenosis. Aorta: The aortic root is normal in size and structure. Venous: The inferior vena cava is dilated in size with less than 50% respiratory variability, suggesting right atrial pressure of 15 mmHg. IAS/Shunts: No atrial level shunt detected by color flow Doppler.  LEFT VENTRICLE PLAX 2D LVIDd:         4.90 cm      Diastology LVIDs:         3.30 cm      LV e' medial:    8.70 cm/s LV PW:  1.00 cm      LV E/e' medial:  9.8 LV IVS:        1.00 cm      LV e' lateral:   8.81 cm/s LVOT diam:     1.60 cm      LV E/e' lateral: 9.7 LVOT Area:     2.01 cm  LV Volumes (MOD) LV vol d, MOD A2C: 122.0 ml LV vol d, MOD A4C: 142.0 ml LV vol s, MOD A2C: 40.8 ml LV vol s, MOD A4C: 57.0 ml LV SV MOD A2C:     81.2 ml LV SV MOD A4C:     142.0 ml LV SV MOD BP:      83.9 ml RIGHT VENTRICLE RV Basal diam:  4.20 cm RV S prime:     11.10 cm/s TAPSE (M-mode): 1.4 cm LEFT ATRIUM             Index        RIGHT ATRIUM           Index LA diam:        4.00 cm 1.86 cm/m   RA Area:     25.30 cm LA Vol (A2C):    68.9 ml 31.98 ml/m  RA Volume:   92.60 ml  42.98 ml/m LA Vol (A4C):   39.3 ml 18.24 ml/m LA Biplane Vol: 53.9 ml 25.02 ml/m   AORTA Ao Root diam: 2.80 cm Ao Asc diam:  3.10 cm MITRAL VALVE               TRICUSPID VALVE MV Area (PHT): 4.15 cm    TV Peak grad:   9.6 mmHg MV Decel Time: 183 msec    TV Vmax:        1.55 m/s MV E velocity: 85.40 cm/s MV A velocity: 21.00 cm/s  SHUNTS MV E/A ratio:  4.07        Systemic Diam: 1.60 cm Toribio Fuel MD Electronically signed by Toribio Fuel MD Signature Date/Time: 11/18/2023/6:04:53 PM    Final    CT CHEST ABDOMEN PELVIS WO CONTRAST Result Date: 11/17/2023 CLINICAL DATA:  SOB, FLANK PAIN, SEPTIC EXAM: CT CHEST, ABDOMEN AND PELVIS WITHOUT CONTRAST TECHNIQUE: Multidetector CT imaging of the chest, abdomen and pelvis was performed following the standard protocol without IV contrast. RADIATION DOSE REDUCTION: This exam was performed according to the departmental dose-optimization program which includes automated exposure control, adjustment of the mA and/or kV according to patient size and/or use of iterative reconstruction technique. COMPARISON:  07/01/2023 FINDINGS: CT CHEST FINDINGS Cardiovascular: Heart is normal size. Aorta is normal caliber. Three vessel coronary artery disease and moderate aortic atherosclerosis. Mediastinum/Nodes: No mediastinal, hilar, or axillary adenopathy. Trachea and esophagus are unremarkable. Thyroid  unremarkable. Lungs/Pleura: Lungs are clear. No focal airspace opacities or suspicious nodules. No effusions. Musculoskeletal: Chest wall soft tissues are unremarkable. No acute bony abnormality. CT ABDOMEN PELVIS FINDINGS Hepatobiliary: No focal hepatic abnormality. Gallbladder unremarkable. Pancreas: No focal abnormality or ductal dilatation. Spleen: No focal abnormality.  Normal size. Adrenals/Urinary Tract: Adrenal glands normal. 11 mm right renal pelvic stone. No hydronephrosis. Mild perinephric stranding. Findings are similar to  prior study. No stones or hydronephrosis on the left. Urinary bladder unremarkable. Stomach/Bowel: Stomach, large and small bowel grossly unremarkable. Vascular/Lymphatic: Aortic atherosclerosis. No evidence of aneurysm or adenopathy. Reproductive: Prior hysterectomy.  No adnexal masses. Other: No free fluid or free air. Musculoskeletal: No acute bony abnormality. IMPRESSION: No acute cardiopulmonary disease. Three-vessel coronary artery disease, aortic atherosclerosis. 11 mm right  renal pelvic stone without hydronephrosis. Perinephric stranding noted. Findings are stable since prior study. Aortic atherosclerosis. No acute findings in the abdomen or pelvis. Electronically Signed   By: Franky Crease M.D.   On: 11/17/2023 23:35   DG Chest 2 View Result Date: 11/17/2023 CLINICAL DATA:  Shortness of breath. EXAM: CHEST - 2 VIEW COMPARISON:  12/17/2022. FINDINGS: The heart is borderline enlarged and mediastinal contours are within normal limits. There is atherosclerotic calcification of the aorta. No consolidation, effusion, or pneumothorax is seen. Degenerative changes are present in the thoracic spine. No acute osseous abnormality. IMPRESSION: No active cardiopulmonary disease. Electronically Signed   By: Leita Birmingham M.D.   On: 11/17/2023 20:47    Microbiology: Recent Results (from the past 240 hours)  Resp panel by RT-PCR (RSV, Flu A&B, Covid) Anterior Nasal Swab     Status: None   Collection Time: 11/17/23  9:11 PM   Specimen: Anterior Nasal Swab  Result Value Ref Range Status   SARS Coronavirus 2 by RT PCR NEGATIVE NEGATIVE Final   Influenza A by PCR NEGATIVE NEGATIVE Final   Influenza B by PCR NEGATIVE NEGATIVE Final    Comment: (NOTE) The Xpert Xpress SARS-CoV-2/FLU/RSV plus assay is intended as an aid in the diagnosis of influenza from Nasopharyngeal swab specimens and should not be used as a sole basis for treatment. Nasal washings and aspirates are unacceptable for Xpert Xpress  SARS-CoV-2/FLU/RSV testing.  Fact Sheet for Patients: BloggerCourse.com  Fact Sheet for Healthcare Providers: SeriousBroker.it  This test is not yet approved or cleared by the [place]  FDA and has been authorized for detection and/or diagnosis of SARS-CoV-2 by FDA under an Emergency Use Authorization (EUA). This EUA will remain in effect (meaning this test can be used) for the duration of the COVID-19 declaration under Section 564(b)(1) of the Act, 21 U.S.C. section 360bbb-3(b)(1), unless the authorization is terminated or revoked.     Resp Syncytial Virus by PCR NEGATIVE NEGATIVE Final    Comment: (NOTE) Fact Sheet for Patients: BloggerCourse.com  Fact Sheet for Healthcare Providers: SeriousBroker.it  This test is not yet approved or cleared by the [place]  FDA and has been authorized for detection and/or diagnosis of SARS-CoV-2 by FDA under an Emergency Use Authorization (EUA). This EUA will remain in effect (meaning this test can be used) for the duration of the COVID-19 declaration under Section 564(b)(1) of the Act, 21 U.S.C. section 360bbb-3(b)(1), unless the authorization is terminated or revoked.  Performed at [facility], [address] [place], [place] [address]   Blood Culture (routine x 2)     Status: None (Preliminary result)   Collection Time: 11/17/23 10:15 PM   Specimen: BLOOD RIGHT FOREARM  Result Value Ref Range Status   Specimen Description BLOOD RIGHT FOREARM  Final   Special Requests   Final    BOTTLES DRAWN AEROBIC AND ANAEROBIC Blood Culture adequate volume   Culture   Final    NO GROWTH 3 DAYS Performed at [facility], [address], [place], [place] [address]    Report Status PENDING  Incomplete  Blood Culture (routine x 2)     Status: None (Preliminary result)   Collection Time: 11/17/23 10:18 PM   Specimen: BLOOD   Result Value Ref Range Status   Specimen Description BLOOD LEFT ANTECUBITAL  Final   Special Requests   Final    BOTTLES DRAWN AEROBIC AND ANAEROBIC Blood Culture adequate volume   Culture   Final  NO GROWTH 3 DAYS Performed at [facility], [address], [place], [place] [address]    Report Status PENDING  Incomplete     Labs: Basic Metabolic Panel: Recent Labs  Lab 11/17/23 2038 11/18/23 0511 11/19/23 0251 11/20/23 0315 11/21/23 0233  NA 132* 135 140 138 139  K 3.2* 3.3* 3.9 3.7 4.1  CL 97* 103 107 101 99  CO2 20* 25 24 27 30   GLUCOSE 236* 182* 168* 144* 149*  BUN 14 13 10 11 16   CREATININE 1.58* 1.17* 0.89 0.95 1.21*  CALCIUM  7.5* 7.2* 7.6* 8.0* 8.5*  MG  --   --  1.2* 1.8  --    Liver Function Tests: Recent Labs  Lab 11/17/23 2038  AST 31  ALT 16  ALKPHOS 60  BILITOT 1.5*  PROT 5.9*  ALBUMIN 2.7*   No results for input(s): LIPASE, AMYLASE in the last 168 hours. No results for input(s): AMMONIA in the last 168 hours. CBC: Recent Labs  Lab 11/17/23 2038 11/18/23 0511 11/19/23 0251 11/20/23 1017 11/21/23 0233  WBC 16.6* 15.1* 9.5 5.9 6.1  NEUTROABS 14.8*  --  7.4 3.6 3.6  HGB 12.1 10.1* 10.5* 11.3* 11.6*  HCT 36.2 30.3* 31.7* 34.6* 36.2  MCV 91.6 92.4 93.2 92.3 93.5  PLT 187 171 166 189 252   Cardiac Enzymes: No results for input(s): CKTOTAL, CKMB, CKMBINDEX, TROPONINI in the last 168 hours. BNP: BNP (last 3 results) Recent Labs    12/17/22 1727 11/17/23 2038  BNP 204.2* 679.9*    ProBNP (last 3 results) No results for input(s): PROBNP in the last 8760 hours.  CBG: Recent Labs  Lab 11/20/23 0624 11/20/23 1125 11/20/23 1540 11/20/23 2216 11/21/23 0621  GLUCAP 120* 157* 195* 158* 144*    Signed:  Colen Grimes MD   [facility] 11/21/2023, 8:25 AM

## 2023-11-21 NOTE — Plan of Care (Signed)
 Patient had 7 runs of V- Tach at [id number] when patient get up to use bedside commode, patient is asymptomatic. Notified Dr. Alfornia about the event. Updated AM nurse about the event.  Problem: Nutritional: Goal: Progress toward achieving an optimal weight will improve Outcome: Progressing   Problem: Skin Integrity: Goal: Risk for impaired skin integrity will decrease Outcome: Progressing   Problem: Tissue Perfusion: Goal: Adequacy of tissue perfusion will improve 11/21/2023 0732 by Sammuel Mickel Mini, RN Outcome: Progressing 11/21/2023 0730 by Sammuel Mickel Mini, RN Outcome: Progressing   Problem: Education: Goal: Knowledge of General Education information will improve Description: Including pain rating scale, medication(s)/side effects and non-pharmacologic comfort measures Outcome: Progressing   Problem: Health Behavior/Discharge Planning: Goal: Ability to manage health-related needs will improve Outcome: Progressing   Problem: Clinical Measurements: Goal: Will remain free from infection Outcome: Progressing Goal: Respiratory complications will improve Outcome: Progressing Goal: Cardiovascular complication will be avoided Outcome: Progressing   Problem: Elimination: Goal: Will not experience complications related to bowel motility Outcome: Progressing Goal: Will not experience complications related to urinary retention Outcome: Progressing   Problem: Skin Integrity: Goal: Risk for impaired skin integrity will decrease Outcome: Progressing

## 2023-11-21 NOTE — Progress Notes (Signed)
 Mobility Specialist Progress Note:   11/21/23 0905  Mobility  Activity Ambulated with assistance  Level of Assistance Contact guard assist, steadying assist  Assistive Device Front wheel walker  Distance Ambulated (ft) 5 ft  Activity Response Tolerated fair  Mobility Referral Yes  Mobility visit 1 Mobility  Mobility Specialist Start Time (ACUTE ONLY) [id number]  Mobility Specialist Stop Time (ACUTE ONLY) 0920  Mobility Specialist Time Calculation (min) (ACUTE ONLY) 15 min   Pt agreeable to mobility session. Continues to be limited by severely elevated HR. Up to 180bpm with just a few steps away from the bed. Pt asx throughout. Further mobility deferred. HR 90s at end of session, RN notified.   Therisa Rana Mobility Specialist Please contact via SecureChat or  Rehab office at [phone]

## 2023-11-22 ENCOUNTER — Telehealth: Payer: Self-pay | Admitting: *Deleted

## 2023-11-22 LAB — CULTURE, BLOOD (ROUTINE X 2)
Culture: NO GROWTH
Culture: NO GROWTH
Special Requests: ADEQUATE
Special Requests: ADEQUATE

## 2023-11-22 NOTE — Transitions of Care (Post Inpatient/ED Visit) (Signed)
   11/22/2023  Name: Andrea Perry MRN: [id number] DOB: 1951/03/31  Today's TOC FU Call Status: Today's TOC FU Call Status:: Unsuccessful Call (1st Attempt) Unsuccessful Call (1st Attempt) Date: 11/22/23  Attempted to reach the patient regarding the most recent Inpatient/ED visit.  Follow Up Plan: Additional outreach attempts will be made to reach the patient to complete the Transitions of Care (Post Inpatient/ED visit) call.   Cathlean Headland BSN RN [place] [facility] Care Management Coordinator [email or web address] Direct Dial: [phone]  Fax: [phone] Website: [email or web address]

## 2023-11-24 ENCOUNTER — Telehealth: Payer: Self-pay | Admitting: *Deleted

## 2023-11-24 NOTE — Transitions of Care (Post Inpatient/ED Visit) (Signed)
   11/24/2023  Name: ORLA ESTRIN MRN: [id number] DOB: 03-25-1951  Today's TOC FU Call Status: Today's TOC FU Call Status:: Unsuccessful Call (2nd Attempt) Unsuccessful Call (2nd Attempt) Date: 11/24/23  Attempted to reach the patient regarding the most recent Inpatient/ED visit.  Follow Up Plan: Additional outreach attempts will be made to reach the patient to complete the Transitions of Care (Post Inpatient/ED visit) call.   Cathlean Headland BSN RN [facility] Care Management Coordinator [email or web address] Direct Dial: [phone]  Fax: [phone] Website: [email or web address]

## 2023-11-25 ENCOUNTER — Telehealth: Payer: Self-pay | Admitting: *Deleted

## 2023-11-25 NOTE — Transitions of Care (Post Inpatient/ED Visit) (Signed)
   11/25/2023  Name: Andrea Perry MRN: [id number] DOB: [birth year]  Today's TOC FU Call Status: Today's TOC FU Call Status:: Unsuccessful Call (3rd Attempt) Unsuccessful Call (3rd Attempt) Date: 11/25/23  Attempted to reach the patient regarding the most recent Inpatient visit.  Phone range 15-plus times without physical or voice mail pick up; eventually received automated outgoing message stating your call annot be completed at this time; with spontaneous cessation of call attempt- unable to leave message   Follow Up Plan: No further outreach attempts will be made at this time. We have been unable to contact the patient.  Pls call/ message for questions,  Loris Winrow Mckinney Domenico Achord, RN, BSN, CCRN Alumnus RN Care Manager  Transitions of Care  [facility] [phone]: direct office

## 2023-12-02 ENCOUNTER — Other Ambulatory Visit: Payer: Self-pay | Admitting: Cardiovascular Disease

## 2023-12-02 DIAGNOSIS — I4811 Longstanding persistent atrial fibrillation: Secondary | ICD-10-CM

## 2023-12-02 NOTE — Telephone Encounter (Signed)
 Prescription refill request for Eliquis  received. Indication: afib  Last office visit: [place]3/18/2024 Scr: 1.21, 11/21/2023 Age: 73 yo  Weight: 111.4 kg    Pt overdue for an office visit. Msg sent to schedulers.

## 2023-12-03 NOTE — Telephone Encounter (Signed)
 Attempted to call pt. No answer and unable to leave message on machine.

## 2023-12-10 ENCOUNTER — Other Ambulatory Visit: Payer: Self-pay | Admitting: Internal Medicine

## 2023-12-10 DIAGNOSIS — E039 Hypothyroidism, unspecified: Secondary | ICD-10-CM

## 2023-12-18 ENCOUNTER — Other Ambulatory Visit: Payer: Self-pay | Admitting: Internal Medicine

## 2023-12-23 ENCOUNTER — Encounter: Payer: Self-pay | Admitting: Cardiovascular Disease

## 2023-12-23 ENCOUNTER — Telehealth: Payer: Self-pay | Admitting: Cardiovascular Disease

## 2023-12-23 NOTE — Telephone Encounter (Signed)
 Patient contacted 3x with no success, will send letter.

## 2023-12-23 NOTE — Telephone Encounter (Signed)
-----   Message from Nurse Isaiah D sent at 12/02/2023  4:40 PM EDT ----- Received medication refill request. Pt is overdue for an office visit with provider. Please schedule appointment.   Thank you! - Anticoagulation Team

## 2024-01-02 ENCOUNTER — Other Ambulatory Visit: Payer: Self-pay | Admitting: Cardiovascular Disease

## 2024-01-12 DIAGNOSIS — R3121 Asymptomatic microscopic hematuria: Secondary | ICD-10-CM | POA: Diagnosis not present

## 2024-01-12 DIAGNOSIS — R311 Benign essential microscopic hematuria: Secondary | ICD-10-CM | POA: Diagnosis not present

## 2024-01-12 DIAGNOSIS — N3941 Urge incontinence: Secondary | ICD-10-CM | POA: Diagnosis not present

## 2024-01-12 DIAGNOSIS — N200 Calculus of kidney: Secondary | ICD-10-CM | POA: Diagnosis not present

## 2024-01-18 DIAGNOSIS — N200 Calculus of kidney: Secondary | ICD-10-CM | POA: Diagnosis not present

## 2024-01-19 ENCOUNTER — Ambulatory Visit: Payer: Medicare Other

## 2024-02-13 ENCOUNTER — Other Ambulatory Visit: Payer: Self-pay | Admitting: Cardiovascular Disease

## 2024-02-13 ENCOUNTER — Other Ambulatory Visit: Payer: Self-pay | Admitting: Internal Medicine

## 2024-02-14 ENCOUNTER — Telehealth: Payer: Self-pay | Admitting: Cardiovascular Disease

## 2024-02-14 NOTE — Telephone Encounter (Signed)
   Pre-operative Risk Assessment    Patient Name: Andrea Perry  DOB: 03/29/1951 MRN: [id number]   Date of last office visit: 07/06/22 Date of next office visit: n/a   Request for Surgical Clearance    Procedure:  Right lithotripsy   Date of Surgery:  Clearance TBD                                Surgeon:  Dr. Gaston Surgeon's Group or Practice Name:  [facility] Phone number:  [phone] Fax number:  [phone]   Type of Clearance Requested:   - Medical  - Pharmacy:  Hold Apixaban  (Eliquis ) TBD   Type of Anesthesia:  Local    Additional requests/questions:     SignedBarbee DELENA Sharps   02/14/2024, 2:57 PM

## 2024-02-14 NOTE — Telephone Encounter (Signed)
 Tried to call the pt to schedule in office appt with Dr. Delford or his team for preop clearance.   # [phone] is not valid. I will update the requesting office the pt needs to call the office for an appt in office for preop clearance. Call [phone].

## 2024-02-14 NOTE — Telephone Encounter (Signed)
   Name: Andrea Perry  DOB: December 13, 1950  MRN: [id number]  Primary Cardiologist: Maude Emmer, MD  Chart reviewed as part of pre-operative protocol coverage. Because of Andrea Perry's past medical history and time since last visit, she will require a follow-up in-office visit in order to better assess preoperative cardiovascular risk.  Pre-op covering staff: - Please schedule appointment and call patient to inform them. If patient already had an upcoming appointment within acceptable timeframe, please add pre-op clearance to the appointment notes so provider is aware. - Please contact requesting surgeon's office via preferred method (i.e, phone, fax) to inform them of need for appointment prior to surgery.  This message will also be routed to pharmacy pool for input on holding Eliquis  as requested below so that this information is available to the clearing provider at time of patient's appointment.   Andrea LITTIE Louis, NP  02/14/2024, 3:53 PM

## 2024-02-16 ENCOUNTER — Telehealth: Payer: Self-pay | Admitting: Emergency Medicine

## 2024-02-16 DIAGNOSIS — J438 Other emphysema: Secondary | ICD-10-CM

## 2024-02-16 NOTE — Telephone Encounter (Signed)
 Based on m,y experience we will not be able to get this unless she has been seen in last 90 days and we document the need

## 2024-02-16 NOTE — Telephone Encounter (Signed)
 Called and spoke with the pt. Pt has been scheduled ov with Candis, [place] at [facility] to get a new neb machine.

## 2024-02-16 NOTE — Telephone Encounter (Signed)
 Copied from CRM [id number]. Topic: Clinical - Order For Equipment >> Feb 16, 2024  1:38 PM Andrea Perry wrote: Reason for CRM: Patient states her nebulizer machine is broken and will not work.  She would like an order for a new machine and new supplies (a couple hoses). Please call patient with questions [phone].

## 2024-02-16 NOTE — Telephone Encounter (Signed)
 Patient requesting new neb order Are ok with order being placed,? Please sign

## 2024-02-16 NOTE — Telephone Encounter (Signed)
 OV preop clearance now scheduled. Pt agreed to appt date and time

## 2024-02-16 NOTE — Telephone Encounter (Signed)
 Pt called to f/u and updated telephone [phone] please advise

## 2024-02-17 ENCOUNTER — Other Ambulatory Visit: Payer: Self-pay

## 2024-02-17 MED ORDER — BREZTRI AEROSPHERE 160-9-4.8 MCG/ACT IN AERO: 2.0000 | INHALATION_SPRAY | Freq: Two times a day (BID) | RESPIRATORY_TRACT | 3 refills | Status: DC

## 2024-02-20 NOTE — Telephone Encounter (Signed)
 Patient with diagnosis of atrial fibrillation on Eliquis  for anticoagulation.    Procedure:  Right lithotripsy    Date of Surgery:  Clearance TBD      CHA2DS2-VASc Score = 6   This indicates a 9.7% annual risk of stroke. The patient's score is based upon: CHF History: 1 HTN History: 1 Diabetes History: 1 Stroke History: 0 Vascular Disease History: 1 Age Score: 1 Gender Score: 1  Last OV (3/24) CHADS2-VSc score indicates stroke, I cannot find any patient history of stroke, is noted her father had CVA.  CrCl 81 Platelet count 252  Patient has not had an Afib/aflutter ablation in the last 3 months, DCCV within the last 4 weeks or a watchman implanted in the last 45 days    Per office protocol, patient can hold Eliquis  for 2 days prior to procedure.   Patient will not need bridging with Lovenox (enoxaparin) around procedure.  **This guidance is not considered finalized until pre-operative APP has relayed final recommendations.**

## 2024-02-21 ENCOUNTER — Encounter: Payer: Self-pay | Admitting: Radiology

## 2024-02-21 ENCOUNTER — Encounter: Payer: Self-pay | Admitting: *Deleted

## 2024-02-21 ENCOUNTER — Ambulatory Visit: Admitting: Adult Health

## 2024-02-21 ENCOUNTER — Other Ambulatory Visit: Payer: Self-pay

## 2024-02-21 ENCOUNTER — Encounter: Payer: Self-pay | Admitting: Adult Health

## 2024-02-21 ENCOUNTER — Ambulatory Visit (HOSPITAL_BASED_OUTPATIENT_CLINIC_OR_DEPARTMENT_OTHER)

## 2024-02-21 VITALS — BP 130/83 | HR 64 | Temp 98.5°F | Ht 62.0 in | Wt 248.2 lb

## 2024-02-21 DIAGNOSIS — J4540 Moderate persistent asthma, uncomplicated: Secondary | ICD-10-CM

## 2024-02-21 DIAGNOSIS — J449 Chronic obstructive pulmonary disease, unspecified: Secondary | ICD-10-CM | POA: Diagnosis not present

## 2024-02-21 DIAGNOSIS — Z6841 Body Mass Index (BMI) 40.0 and over, adult: Secondary | ICD-10-CM

## 2024-02-21 DIAGNOSIS — Z23 Encounter for immunization: Secondary | ICD-10-CM | POA: Diagnosis not present

## 2024-02-21 DIAGNOSIS — G4733 Obstructive sleep apnea (adult) (pediatric): Secondary | ICD-10-CM | POA: Diagnosis not present

## 2024-02-21 MED ORDER — ALBUTEROL SULFATE HFA 108 (90 BASE) MCG/ACT IN AERS: 1.0000 | INHALATION_SPRAY | Freq: Four times a day (QID) | RESPIRATORY_TRACT | 2 refills | Status: DC | PRN

## 2024-02-21 MED ORDER — BREZTRI AEROSPHERE 160-9-4.8 MCG/ACT IN AERO: 2.0000 | INHALATION_SPRAY | Freq: Two times a day (BID) | RESPIRATORY_TRACT | 8 refills | Status: AC

## 2024-02-21 NOTE — Progress Notes (Signed)
 @Patient  ID: Andrea Perry, female    DOB: Nov 25, 1950, 73 y.o.   MRN: [id number]  Chief Complaint  Patient presents with   Medical Management of Chronic Issues    Needs a new nebulizer machine     Referring provider: [name], MD  HPI: 73 year old female former smoker followed for COPD with asthma (allergic phenotype), allergic rhinitis History of Sleep apnea -CPAP intolerant  Medical history significant for atrial fibrillation, hypertension and secondary pulmonary hypertension    TEST/EVENTS : Reviewed 02/21/2024  PFT October 2020 showed severe COPD with an FEV1 of 50%, ratio 58, FVC 66%, significant bronchodilator response (61% change), DLCO 72%   10/2023 CT chest clear lungs.    Discussed the use of AI scribe software for clinical note transcription with the patient, who gave verbal consent to proceed.  History of Present Illness Andrea Perry is a 73 year old female with COPD and asthma who presents for a routine follow-up.   She has a history of COPD and asthma, currently managed with Breztri , taking two puffs in the morning and two puffs in the evening. She also uses an albuterol  inhaler for rescue. She quit smoking a long time ago and is not on oxygen  therapy. Her last CT scan of the chest in July was clear.   She has a history of sleep apnea that is CPAP intolerant.  She experienced difficulty using a CPAP machine for her sleep apnea as it makes her feel smothered.  She is on Eliquis  for atrial fibrillation  She was hospitalized this summer for cellulitis in her leg.  Says that she has fully recovered.   She also has a history of a kidney stone and is awaiting clearance from her cardiologist for surgery.  In her social history, she lives at home with her son, granddaughter, and niece, who work during the day and are home at night. She does not drive and uses a walker due to leg weakness.  No current use of oxygen  therapy. Reports leg swelling, managed with daily  diuretics.     Allergies  Allergen Reactions   Tramadol Hcl Nausea And Vomiting and Other (See Comments)     mouth dryness, headache   Codeine  Nausea And Vomiting and Nausea Only   Tape Rash    Plastic tape, bandaids and ekg leads, causes redness and rash Other reaction(s): Not available   Vicodin [Hydrocodone -Acetaminophen ] Nausea And Vomiting    Immunization History  Administered Date(s) Administered   Fluad Quad(high Dose 65+) 02/09/2019, 06/11/2020, 01/09/2021, 02/04/2022   Fluad Trivalent(High Dose 65+) 04/12/2023   INFLUENZA, HIGH DOSE SEASONAL PF 03/23/2018, 02/21/2024   Influenza,inj,Quad PF,6+ Mos 03/01/2013, 02/14/2014, 03/08/2015, 04/16/2016, 01/12/2017   Influenza-Unspecified 01/18/2017   Pneumococcal Conjugate-13 03/30/2013   Pneumococcal Polysaccharide-23 04/04/2014, 07/09/2020   Td 01/28/2023   Tdap 09/24/2010    Past Medical History:  Diagnosis Date   ALLERGIC RHINITIS 08/16/2008   ANXIETY 08/16/2008   Arthritis    hands, knees, lower back   ASTHMA 08/16/2008   Asthma    BRONCHITIS     [name]     Bladder leak    Cancer (HCC)    MELANOMA       Chronic lower back pain    problems with disc L2-5   COPD 08/16/2008   DEPRESSION 08/16/2008   Diabetes mellitus without complication (HCC)    Dysrhythmia    hx AF   Excessive daytime sleepiness 06/19/2015   GENITAL HERPES 12/03/2006   GERD (  gastroesophageal reflux disease)    H/O hiatal hernia    HEMATOCHEZIA 06/20/2009   Hemorrhoids    History of cardioversion 10/30/14   History of kidney stones    HYPERLIPIDEMIA 08/16/2008   HYPERTENSION 12/03/2006   Hypertension    Impaired glucose tolerance 09/21/2010   Overactive bladder 10/20/2015   Pneumonia    as a child   Pulmonary HTN (HCC) 06/19/2015   Shortness of breath dyspnea    Sleep apnea    MILD NO CPAP ORDERED   Snoring 06/19/2015    Tobacco History: Social History   Tobacco Use  Smoking Status Former   Current packs/day: 0.00   Average packs/day: 1.5  packs/day for 30.0 years (45.0 ttl pk-yrs)   Types: Cigarettes   Start date: 04/20/1973   Quit date: 04/21/2003   Years since quitting: 20.8   Passive exposure: Past  Smokeless Tobacco Never   Counseling given: Not Answered   Outpatient Medications Prior to Visit  Medication Sig Dispense Refill   acetaminophen  (TYLENOL ) 500 MG tablet Take 500 mg by mouth 2 (two) times daily.     amLODipine  (NORVASC ) 10 MG tablet Take 1 tablet by mouth once daily 90 tablet 3   apixaban  (ELIQUIS ) 5 MG TABS tablet Take 1 tablet (5 mg total) by mouth 2 (two) times daily. Needs cardiology appt for Eliquis  refills.  Call office [phone].  Thank you 60 tablet 0   Blood Glucose Monitoring Suppl (FREESTYLE LITE) DEVI Use as directed daily E11.9 1 each 0   budesonide -glycopyrrolate -formoterol  (BREZTRI  AEROSPHERE) 160-9-4.8 MCG/ACT AERO inhaler Inhale 2 puffs into the lungs 2 (two) times daily. 3 each 8   cetirizine (ZYRTEC) 10 MG tablet Take 10 mg by mouth daily.     Cholecalciferol  50 MCG (2000 UT) TABS 1 tab by mouth once daily 30 tablet 99   dimenhyDRINATE (DRAMAMINE) 50 MG tablet Take 50 mg by mouth every 8 (eight) hours as needed for nausea.     escitalopram  (LEXAPRO ) 20 MG tablet Take 1 tablet (20 mg total) by mouth daily. 90 tablet 1   estradiol  (ESTRACE ) 1 MG tablet Take 1 mg by mouth daily.     ferrous sulfate  325 (65 FE) MG tablet Take 1 tablet (325 mg total) by mouth daily. 30 tablet 0   furosemide  (LASIX ) 40 MG tablet Take 1 tablet by mouth once daily 90 tablet 0   glucose blood (FREESTYLE LITE) test strip Use as instructed once daily E11.9 100 each 12   ipratropium-albuterol  (DUONEB) 0.5-2.5 (3) MG/3ML SOLN Take 3 mLs by nebulization every 6 (six) hours as needed. 360 mL 0   irbesartan  (AVAPRO ) 150 MG tablet Take 1 tablet (150 mg total) by mouth daily. 90 tablet 3   Lancets MISC Use as directed once daily E11.9 100 each 11   levothyroxine  (SYNTHROID ) 50 MCG tablet Take 1 tablet by mouth once daily 90  tablet 0   LORazepam  (ATIVAN ) 1 MG tablet Take 1 tablet by mouth twice daily as needed for anxiety 60 tablet 1   Magnesium  Oxide -Mg Supplement 500 MG CAPS 1 tab by mouth twice per day (Patient taking differently: Take 1 capsule by mouth in the morning. 1 tab by mouth twice per day) 60 capsule 11   metFORMIN  (GLUCOPHAGE -XR) 500 MG 24 hr tablet TAKE 2 TABLETS BY MOUTH ONCE DAILY WITH BREAKFAST 180 tablet 3   metoprolol  tartrate (LOPRESSOR ) 100 MG tablet Take 1 tablet (100 mg total) by mouth 2 (two) times daily. 30 tablet 0  pantoprazole  (PROTONIX ) 40 MG tablet Take 1 tablet (40 mg total) by mouth daily. 90 tablet 3   rosuvastatin  (CRESTOR ) 40 MG tablet Take 1 tablet (40 mg total) by mouth daily. 90 tablet 1   TETRAHYDROZOLINE HCL OP Place 1 drop into both eyes 2 (two) times daily as needed (for redness).     Vibegron (GEMTESA) 75 MG TABS Take 75 mg by mouth daily. For 28 days     albuterol  (PROVENTIL ) (2.5 MG/3ML) 0.083% nebulizer solution USE EVERY 4 HOURS WITH IPRATROPIUM 300 mL 12   tolterodine  (DETROL  LA) 4 MG 24 hr capsule Take 1 capsule (4 mg total) by mouth daily. (Patient not taking: Reported on 02/21/2024) 90 capsule 3   No facility-administered medications prior to visit.     Review of Systems:   Constitutional:   No  weight loss, night sweats,  Fevers, chills, +fatigue, or  lassitude.  HEENT:   No headaches,  Difficulty swallowing,  Tooth/dental problems, or  Sore throat,                No sneezing, itching, ear ache, nasal congestion, post nasal drip,   CV:  No chest pain,  Orthopnea, PND, swelling in lower extremities, anasarca, dizziness, palpitations, syncope.   GI  No heartburn, indigestion, abdominal pain, nausea, vomiting, diarrhea, change in bowel habits, loss of appetite, bloody stools.   Resp:   No chest wall deformity  Skin: no rash or lesions.  GU: no dysuria, change in color of urine, no urgency or frequency.  No flank pain, no hematuria   MS:  No joint pain or  swelling.  No decreased range of motion.  No back pain.    Physical Exam  BP 130/83   Pulse 64   Temp 98.5 F (36.9 C) (Oral)   Ht 5' 2 (1.575 m) Comment: per patient  Wt 248 lb 3.2 oz (112.6 kg)   SpO2 96%   BMI 45.40 kg/m   GEN: A/Ox3; pleasant , NAD, well nourished, walker   HEENT:  Williams/AT,  EACs-clear, TMs-wnl, NOSE-clear, THROAT-clear, no lesions, no postnasal drip or exudate noted.   NECK:  Supple w/ fair ROM; no JVD; normal carotid impulses w/o bruits; no thyromegaly or nodules palpated; no lymphadenopathy.    RESP  Clear  P & A; w/o, wheezes/ rales/ or rhonchi. no accessory muscle use, no dullness to percussion  CARD:  RRR, no m/r/g, 1+ peripheral edema, pulses intact, no cyanosis or clubbing.  GI:   Soft & nt; nml bowel sounds; no organomegaly or masses detected.   Musco: Warm bil, no deformities or joint swelling noted.   Neuro: alert, no focal deficits noted.    Skin: Warm, no lesions or rashes    Lab Results:Reviewed 02/21/2024   CBC    Component Value Date/Time   WBC 6.1 11/21/2023 0233   RBC 3.87 11/21/2023 0233   HGB 11.6 (L) 11/21/2023 0233   HCT 36.2 11/21/2023 0233   PLT 252 11/21/2023 0233   MCV 93.5 11/21/2023 0233   MCH 30.0 11/21/2023 0233   MCHC 32.0 11/21/2023 0233   RDW 15.4 11/21/2023 0233   LYMPHSABS 1.7 11/21/2023 0233   MONOABS 0.5 11/21/2023 0233   EOSABS 0.1 11/21/2023 0233   BASOSABS 0.1 11/21/2023 0233    BMET    Component Value Date/Time   NA 139 11/21/2023 0233   NA 136 06/26/2020 1247   K 4.1 11/21/2023 0233   CL 99 11/21/2023 0233   CO2 30 11/21/2023 0233  GLUCOSE 149 (H) 11/21/2023 0233   BUN 16 11/21/2023 0233   BUN 24 06/26/2020 1247   CREATININE 1.21 (H) 11/21/2023 0233   CALCIUM  8.5 (L) 11/21/2023 0233   GFRNONAA 48 (L) 11/21/2023 0233   GFRAA >60 03/28/2019 0912    BNP    Component Value Date/Time   BNP 679.9 (H) 11/17/2023 2038    ProBNP    Component Value Date/Time   PROBNP 244.0 (H)  07/09/2020 1441    Imaging: No results found.  Administration History     None          Latest Ref Rng & Units 02/09/2019    1:06 PM  PFT Results  FVC-Pre L 1.48   FVC-Predicted Pre % 53   FVC-Post L 1.84   FVC-Predicted Post % 66   Pre FEV1/FVC % % 45   Post FEV1/FCV % % 58   FEV1-Pre L 0.66   FEV1-Predicted Pre % 31   FEV1-Post L 1.07   DLCO uncorrected ml/min/mmHg 12.93   DLCO UNC% % 72   DLVA Predicted % 81   TLC L 4.73   TLC % Predicted % 102   RV % Predicted % 152     No results found for: NITRICOXIDE      No data to display              Assessment & Plan:   Assessment and Plan Assessment & Plan Chronic obstructive pulmonary disease with asthma  -appears stable CT chest in July 2025 showed clear lungs.  Continue on Breztri  twice daily.  May use albuterol  inhaler or DuoNeb nebulizer as needed.  Order for new neb machine sent to DME company. Administer flu vaccine for those 65 and older. Schedule a follow-up in 4 months with a pulmonary function test.  Chronic lower extremity edema.  -Appears stable.   Continue on current regimen.   Morbid obesity with BMI of 45.  Encouraged on healthy weight loss  Obstructive sleep apnea CPAP intolerant.-Encouraged healthy weight loss.  Positional sleep.  Plan  Patient Instructions  Continue on Breztri  2 puffs Twice daily, rinse after use .  Albuterol  inhaler or Duoneb As needed   Order for new neb machine .  Activity as tolerated.  Work on healthy weight loss Flu shot today .  Follow up with [name]  in 4 -6 months with PFT and As needed              Madelin Stank, NP 02/21/2024  I spent   minutes dedicated to the care of this patient on the date of this encounter to include pre-visit review of records, face-to-face time with the patient discussing conditions above, post visit ordering of testing, clinical documentation with the electronic health record, making appropriate referrals as  documented, and communicating necessary findings to members of the patients care team.

## 2024-02-21 NOTE — Patient Instructions (Addendum)
 Continue on Breztri  2 puffs Twice daily, rinse after use .  Albuterol  inhaler or Duoneb As needed   Order for new neb machine .  Activity as tolerated.  Work on healthy weight loss Flu shot today .  Follow up with [name]  in 4 -6 months with PFT and As needed

## 2024-02-21 NOTE — Telephone Encounter (Signed)
 Breztri  rx faxed to [facility].

## 2024-02-23 ENCOUNTER — Encounter: Payer: Self-pay | Admitting: *Deleted

## 2024-02-24 ENCOUNTER — Encounter: Payer: Self-pay | Admitting: Emergency Medicine

## 2024-02-24 ENCOUNTER — Ambulatory Visit: Attending: Emergency Medicine | Admitting: Emergency Medicine

## 2024-02-24 VITALS — BP 119/63 | HR 63 | Ht 62.0 in | Wt 248.0 lb

## 2024-02-24 DIAGNOSIS — I10 Essential (primary) hypertension: Secondary | ICD-10-CM

## 2024-02-24 DIAGNOSIS — G4733 Obstructive sleep apnea (adult) (pediatric): Secondary | ICD-10-CM

## 2024-02-24 DIAGNOSIS — I5032 Chronic diastolic (congestive) heart failure: Secondary | ICD-10-CM | POA: Diagnosis not present

## 2024-02-24 DIAGNOSIS — Z01818 Encounter for other preprocedural examination: Secondary | ICD-10-CM | POA: Diagnosis not present

## 2024-02-24 DIAGNOSIS — I4811 Longstanding persistent atrial fibrillation: Secondary | ICD-10-CM

## 2024-02-24 DIAGNOSIS — E785 Hyperlipidemia, unspecified: Secondary | ICD-10-CM

## 2024-02-24 DIAGNOSIS — Z7901 Long term (current) use of anticoagulants: Secondary | ICD-10-CM

## 2024-02-24 MED ORDER — APIXABAN 5 MG PO TABS: 5.0000 mg | ORAL_TABLET | Freq: Two times a day (BID) | ORAL | 3 refills | Status: AC

## 2024-02-24 MED ORDER — METOPROLOL TARTRATE 100 MG PO TABS: 100.0000 mg | ORAL_TABLET | Freq: Two times a day (BID) | ORAL | 3 refills | Status: AC

## 2024-02-24 NOTE — Progress Notes (Signed)
 Cardiology Office Note:    Date:  02/24/2024  ID:  Andrea Perry, DOB 11/05/1950, MRN [id number] PCP: [name], MD   HeartCare Providers Cardiologist:  Maude Emmer, MD       Patient Profile:       Chief Complaint: 1 year follow-up and preoperative clearance History of Present Illness:  Andrea Perry is a 73 y.o. female with visit-pertinent history of persistent atrial fibrillation, hypertension, hyperlipidemia, diastolic dysfunction, diabetes, GERD, COPD, asthma, obesity  Myoview in 2016 was normal.  Echocardiogram 05/2020 with EF 50 to 55%.  Failed DCCV x 2 as well as flecainide .  Did not want to pursue hospitalization for Tikosyn or ablation.  Has been rate controlled on Eliquis  for OAC.  Last seen in clinic on 07/06/2022.  She remains asymptomatic with well-controlled heart rate and longstanding atrial fibrillation.  Her blood pressure was well-controlled.  She was to follow-up in 1 year.  She was admitted on 11/17/2023 for right lower extremity cellulitis with sepsis.  Symptoms improved on antibiotic therapy.  She is now pending right lithotripsy on date TBD with [facility].   Discussed the use of AI scribe software for clinical note transcription with the patient, who gave verbal consent to proceed.  History of Present Illness Andrea Perry is a 73 year old female with atrial fibrillation, diastolic heart failure, and hypertension who presents for cardiac clearance for an upcoming kidney stone procedure. She is accompanied by her son.  Today she is doing well without acute cardiovascular concerns or complaints.  She denies any chest pains, exertional symptoms, orthopnea, or PND.  Her chronic dyspnea is stable and well-controlled.  Atrial fibrillation is managed with Eliquis .  She occasionally feels her heart racing when excited.  She denies any palpitations.  No syncope or presyncope she does not monitor her heart rate or blood pressure at home.  Diastolic  heart failure and hypertension are managed with metoprolol  (100 mg twice daily), amlodipine  (10 mg), irbesartan  (150 mg), and Lasix  for fluid management.  She will occasionally experience some lower extremity swelling when she has been on her feet thought today.  Swelling is quickly resolved with leg elevation and rest.  She has COPD and follows with a pulmonologist. She recently obtained a new nebulizer for her treatments. She does not use oxygen  at home and has a history of sleep apnea but does not use a CPAP mask due to discomfort.  Her son reports that she is not very active, but she does perform some household chores like folding clothes, doing dishes, and sweeping floors.    Review of systems:  Please see the history of present illness. All other systems are reviewed and otherwise negative.      Studies Reviewed:    EKG Interpretation Date/Time:  Thursday February 24 2024 14:09:50 EST Ventricular Rate:  57 PR Interval:    QRS Duration:  78 QT Interval:  436 QTC Calculation: 424 R Axis:   -1  Text Interpretation: Atrial fibrillation with slow ventricular response Low voltage QRS Cannot rule out Anterior infarct (cited on or before 24-Feb-2024) When compared with ECG of 24-Feb-2024 14:08, No significant change was found Confirmed by Rana Dixon [phone]) on 02/24/2024 2:42:13 PM   Echocardiogram 11/18/2023  1. Left ventricular ejection fraction, by estimation, is 60 to 65%. The  left ventricle has normal function. The left ventricle has no regional  wall motion abnormalities. Left ventricular diastolic function could not  be evaluated.   2. Right  ventricular systolic function is mildly reduced. The right  ventricular size is moderately enlarged.   3. Right atrial size was moderately dilated.   4. The mitral valve is normal in structure. No evidence of mitral valve  regurgitation. No evidence of mitral stenosis.   5. Tricuspid valve regurgitation is moderate to severe.   6.  The aortic valve is tricuspid. There is mild calcification of the  aortic valve. Aortic valve regurgitation is not visualized. No aortic  stenosis is present.   7. The inferior vena cava is dilated in size with <50% respiratory  variability, suggesting right atrial pressure of 15 mmHg.   Echocardiogram limited 05/22/2020  1. Left ventricular ejection fraction, by estimation, is 50 to 55%. The  left ventricle has low normal function. Left ventricular endocardial  border not optimally defined to evaluate regional wall motion. There is  mild left ventricular hypertrophy. Left  ventricular diastolic parameters are indeterminate.   2. Right ventricle is not well visualized but grossly normal size and  systolic function. The estimated right ventricular systolic pressure is  34.4 mmHg.   3. The mitral valve is normal in structure. No evidence of mitral valve  regurgitation.   4. The aortic valve was not well visualized. Aortic valve regurgitation  is not visualized.   5. The inferior vena cava is dilated in size with >50% respiratory  variability, suggesting right atrial pressure of 8 mmHg.   Echocardiogram 05/24/2019 1. Left ventricular ejection fraction, by visual estimation, is 55 to  60%. The left ventricle has normal function. There is no left ventricular  hypertrophy.   2. Left ventricular diastolic parameters are indeterminate.   3. Global right ventricle has normal systolic function.The right  ventricular size is normal.   4. Left atrial size was normal.   5. Right atrial size was normal.   6. Mild mitral annular calcification.   7. The mitral valve is normal in structure. No evidence of mitral valve  regurgitation.   8. The tricuspid valve is normal in structure.   9. The tricuspid valve is normal in structure. Tricuspid valve  regurgitation is not demonstrated.  10. The aortic valve was not well visualized. Aortic valve regurgitation  is not visualized. No evidence of aortic  valve stenosis.  11. The pulmonic valve was not well visualized. Pulmonic valve  regurgitation is not visualized.  12. The inferior vena cava is dilated in size with <50% respiratory  variability, suggesting right atrial pressure of 15 mmHg.  13. TR signal is inadequate for assessing pulmonary artery systolic  pressure.  Risk Assessment/Calculations:    CHA2DS2-VASc Score = 6   This indicates a 9.7% annual risk of stroke. The patient's score is based upon: CHF History: 1 HTN History: 1 Diabetes History: 1 Stroke History: 0 Vascular Disease History: 1 Age Score: 1 Gender Score: 1             Physical Exam:   VS:  BP 119/63   Pulse 63   Ht 5' 2 (1.575 m)   Wt 248 lb (112.5 kg)   SpO2 95%   BMI 45.36 kg/m    Wt Readings from Last 3 Encounters:  02/24/24 248 lb (112.5 kg)  02/21/24 248 lb 3.2 oz (112.6 kg)  11/21/23 245 lb 9.5 oz (111.4 kg)    GEN: Well nourished, well developed in no acute distress NECK: No JVD; No carotid bruits CARDIAC: Irregular irregular rhythm.  No murmurs, rubs, gallops RESPIRATORY:  Clear to auscultation without rales,  wheezing or rhonchi  ABDOMEN: Soft, non-tender, non-distended EXTREMITIES:  No edema; No acute deformity      Assessment and Plan:  Longstanding persistent atrial fibrillation Has opted for rate control and continued anticoagulation with no plans for further attempts at restoration back to NSR - EKG today shows atrial fibrillation that is rate controlled at 63 bpm - She is cardiac unaware of arrhythmia with no complaints of palpitations or tachycardia - Continue Eliquis  5 mg twice daily, no bleeding concerns  - Continue metoprolol  tartrate 100 mg twice daily  Chronic HFpEF Echocardiogram 10/2023 showed LVEF 60 to 65%, no RWMA, indeterminate diastolic parameters - Today patient appears euvolemic and well compensated on exam  - Will have occasional LEE when up on feet most of the day but is quickly resolved with elevation and  rest - No dyspnea, orthopnea, PND - Encouraged lower extremity stockings and low-sodium diet - Continue furosemide  40 mg daily  Hypertension Blood pressure today is well-controlled at 119/63 - Continue amlodipine  10 mg daily - Continue irbesartan  150 mg daily - Continue metoprolol  to tartrate 100 mg twice daily   Hyperlipidemia LDL 45 on 03/2023 well-controlled - Continue rosuvastatin  40 mg daily  COPD with asthma Managed on Breztri  twice daily - No recent exacerbations and stable - Management per pulmonology  Obstructive sleep apnea CPAP intolerant - Management per pulmonology  Preoperative cardiovascular clearance According to the Revised Cardiac Risk Index (RCRI), her Perioperative Risk of Major Cardiac Event is (%): 0.9. Her Functional Capacity in METs is: 5.07 according to the Duke Activity Status Index (DASI). Therefore, based on ACC/AHA guidelines, patient would be at acceptable risk for the planned procedure without further cardiovascular testing. I will route this recommendation to the requesting party via Epic fax function.  Per office protocol, patient can hold Eliquis  for 2 days prior to procedure.   Patient will not need bridging with Lovenox (enoxaparin) around procedure.       Dispo:  Return in about 1 year (around 02/23/2025).  Signed, Lum LITTIE Louis, NP

## 2024-02-24 NOTE — Patient Instructions (Signed)
 Medication Instructions:   Your physician recommends that you continue on your current medications as directed. Please refer to the Current Medication list given to you today.  *If you need a refill on your cardiac medications before your next appointment, please call your pharmacy*    Lab Work: NONE ORDERED  TODAY     If you have labs (blood work) drawn today and your tests are completely normal, you will receive your results only by: MyChart Message (if you have MyChart) OR A paper copy in the mail If you have any lab test that is abnormal or we need to change your treatment, we will call you to review the results.    Testing/Procedures: NONE ORDERED  TODAY     Follow-Up: At [facility], you and your health needs are our priority.  As part of our continuing mission to provide you with exceptional heart care, our providers are all part of one team.  This team includes your primary Cardiologist (physician) and Advanced Practice Providers or APPs (Physician Assistants and Nurse Practitioners) who all work together to provide you with the care you need, when you need it.   Your next appointment:    1 year(s)   Provider:    Maude Emmer, MD     We recommend signing up for the patient portal called MyChart.  Sign up information is provided on this After Visit Summary.  MyChart is used to connect with patients for Virtual Visits (Telemedicine).  Patients are able to view lab/test results, encounter notes, upcoming appointments, etc.  Non-urgent messages can be sent to your provider as well.   To learn more about what you can do with MyChart, go to [email or web address].   Other Instructions

## 2024-03-23 DIAGNOSIS — N200 Calculus of kidney: Secondary | ICD-10-CM | POA: Diagnosis not present

## 2024-03-23 DIAGNOSIS — N209 Urinary calculus, unspecified: Secondary | ICD-10-CM | POA: Diagnosis not present

## 2024-04-12 ENCOUNTER — Other Ambulatory Visit: Payer: Self-pay | Admitting: Internal Medicine

## 2024-04-12 DIAGNOSIS — E039 Hypothyroidism, unspecified: Secondary | ICD-10-CM

## 2024-04-17 ENCOUNTER — Ambulatory Visit: Admit: 2024-04-17 | Admitting: Urology

## 2024-04-17 SURGERY — LITHOTRIPSY, ESWL
Anesthesia: LOCAL | Laterality: Right

## 2024-04-25 ENCOUNTER — Other Ambulatory Visit: Payer: Self-pay | Admitting: Urology

## 2024-04-25 ENCOUNTER — Telehealth: Payer: Self-pay | Admitting: Cardiovascular Disease

## 2024-04-25 NOTE — Telephone Encounter (Signed)
"  ° °  Pre-operative Risk Assessment    Patient Name: Andrea Perry  DOB: 04-18-1951 MRN: [id number]   Date of last office visit: 02/24/2024 Date of next office visit: TBD   Request for Surgical Clearance    Procedure:  Extracorporeal Shock Wave Lithotripsy  Date of Surgery:  Clearance 05/05/24                                Surgeon:  Dr. Lovie Socks Group or Practice Name:  [facility]  Phone number:  [phone] Fax number:  [phone]   Type of Clearance Requested:   - Medical  - Pharmacy:  Hold Apixaban  (Eliquis ) 2 days prior   Type of Anesthesia:  Local    Additional requests/questions:  N/A  [name]   04/25/2024, 8:49 AM   "

## 2024-04-26 NOTE — Telephone Encounter (Signed)
" ° °  Patient Name: Andrea Perry  DOB: Apr 11, 1951 MRN: [id number]  Primary Cardiologist: Maude Emmer, MD  Chart reviewed as part of pre-operative protocol coverage. Pre-op clearance already addressed by colleagues in earlier phone notes. To summarize recommendations:  -Preoperative cardiovascular clearance According to the Revised Cardiac Risk Index (RCRI), her Perioperative Risk of Major Cardiac Event is (%): 0.9. Her Functional Capacity in METs is: 5.07 according to the Duke Activity Status Index (DASI). Therefore, based on ACC/AHA guidelines, patient would be at acceptable risk for the planned procedure without further cardiovascular testing. I will route this recommendation to the requesting party via Epic fax function.   Per office protocol, patient can hold Eliquis  for 2 days prior to procedure.   Patient will not need bridging with Lovenox (enoxaparin) around procedure.  Will route this bundled recommendation to requesting provider via Epic fax function and remove from pre-op pool. Please call with questions.  Rollo FABIENE Louder, PA-C 04/26/2024, 4:11 PM  "

## 2024-04-26 NOTE — Telephone Encounter (Signed)
 Patient with diagnosis of atrial fibrillation on Eliquis  for anticoagulation.    Procedure:  Extracorporeal Shock Wave Lithotripsy   Date of Surgery:  Clearance 05/05/24                                CHA2DS2-VASc Score = 6   This indicates a 9.7% annual risk of stroke. The patient's score is based upon: CHF History: 1 HTN History: 1 Diabetes History: 1 Stroke History: 0 Vascular Disease History: 1 Age Score: 1 Gender Score: 1    CrCl 76 Platelet count 252  Patient has not had an Afib/aflutter ablation in the last 3 months, DCCV within the last 4 weeks or a watchman implanted in the last 45 days   Per office protocol, patient can hold Eliquis  for 2 days prior to procedure.   Patient will not need bridging with Lovenox (enoxaparin) around procedure.  **This guidance is not considered finalized until pre-operative APP has relayed final recommendations.**

## 2024-04-27 ENCOUNTER — Ambulatory Visit: Admitting: Emergency Medicine

## 2024-04-28 ENCOUNTER — Ambulatory Visit: Admitting: Emergency Medicine

## 2024-04-28 ENCOUNTER — Telehealth: Payer: Self-pay

## 2024-04-28 ENCOUNTER — Ambulatory Visit: Payer: Self-pay | Admitting: Adult Health

## 2024-04-28 ENCOUNTER — Encounter: Payer: Self-pay | Admitting: Emergency Medicine

## 2024-04-28 ENCOUNTER — Ambulatory Visit: Admitting: *Deleted

## 2024-04-28 VITALS — BP 110/64 | HR 90 | Ht 62.0 in | Wt 258.0 lb

## 2024-04-28 DIAGNOSIS — J301 Allergic rhinitis due to pollen: Secondary | ICD-10-CM

## 2024-04-28 DIAGNOSIS — E66813 Obesity, class 3: Secondary | ICD-10-CM

## 2024-04-28 DIAGNOSIS — J449 Chronic obstructive pulmonary disease, unspecified: Secondary | ICD-10-CM

## 2024-04-28 DIAGNOSIS — K219 Gastro-esophageal reflux disease without esophagitis: Secondary | ICD-10-CM

## 2024-04-28 DIAGNOSIS — G4733 Obstructive sleep apnea (adult) (pediatric): Secondary | ICD-10-CM | POA: Diagnosis not present

## 2024-04-28 DIAGNOSIS — Z6841 Body Mass Index (BMI) 40.0 and over, adult: Secondary | ICD-10-CM | POA: Diagnosis not present

## 2024-04-28 LAB — PULMONARY FUNCTION TEST
DL/VA % pred: 85 %
DL/VA: 3.62 ml/min/mmHg/L
DLCO cor % pred: 80 %
DLCO cor: 14.14 ml/min/mmHg
DLCO unc % pred: 80 %
DLCO unc: 14.14 ml/min/mmHg
FEF 25-75 Post: 0.9 L/s
FEF 25-75 Pre: 0.63 L/s
FEF2575-%Change-Post: 41 %
FEF2575-%Pred-Post: 53 %
FEF2575-%Pred-Pre: 37 %
FEV1-%Change-Post: 9 %
FEV1-%Pred-Post: 71 %
FEV1-%Pred-Pre: 65 %
FEV1-Post: 1.41 L
FEV1-Pre: 1.28 L
FEV1FVC-%Change-Post: -1 %
FEV1FVC-%Pred-Pre: 81 %
FEV6-%Change-Post: 10 %
FEV6-%Pred-Post: 91 %
FEV6-%Pred-Pre: 83 %
FEV6-Post: 2.27 L
FEV6-Pre: 2.06 L
FEV6FVC-%Change-Post: 0 %
FEV6FVC-%Pred-Post: 103 %
FEV6FVC-%Pred-Pre: 104 %
FVC-%Change-Post: 11 %
FVC-%Pred-Post: 88 %
FVC-%Pred-Pre: 79 %
FVC-Post: 2.31 L
FVC-Pre: 2.08 L
Post FEV1/FVC ratio: 61 %
Post FEV6/FVC ratio: 98 %
Pre FEV1/FVC ratio: 62 %
Pre FEV6/FVC Ratio: 99 %
RV % pred: 155 %
RV: 3.19 L
TLC % pred: 117 %
TLC: 5.43 L

## 2024-04-28 MED ORDER — ALBUTEROL SULFATE HFA 108 (90 BASE) MCG/ACT IN AERS: 1.0000 | INHALATION_SPRAY | Freq: Four times a day (QID) | RESPIRATORY_TRACT | 12 refills | Status: AC | PRN

## 2024-04-28 NOTE — Assessment & Plan Note (Signed)
 With associated restrictive disease.  Contributing to her dyspnea.  May need to repeat a walking oximetry at some point going forward.

## 2024-04-28 NOTE — Assessment & Plan Note (Signed)
 Continue Zyrtec .

## 2024-04-28 NOTE — Patient Instructions (Addendum)
 We reviewed your pulmonary function testing today.  There is evidence for moderate asthma/COPD but your airflows are actually improved compared with 5 years ago. Please continue Breztri  2 puffs twice a day.  Rinse and gargle after using.  We will work on getting financial assistance for this medication Keep your DuoNeb available to use 1 nebulizer treatment up to every 6 hours if needed for shortness of breath, chest tightness, wheezing.  We will order a new nebulizer machine for you today. Flu shot is up-to-date Continue your Zyrtec and your Protonix  as you have been taking them Follow-up in our office in 6 months, sooner if you have any problems.

## 2024-04-28 NOTE — Assessment & Plan Note (Signed)
 No flares.  Flu shot is up-to-date.  She does have moderately severe obstruction, superimposed restriction.  On an aggressive BD regimen.  We reviewed your pulmonary function testing today.  There is evidence for moderate asthma/COPD but your airflows are actually improved compared with 5 years ago. Please continue Breztri  2 puffs twice a day.  Rinse and gargle after using.  We will work on getting financial assistance for this medication Keep your DuoNeb available to use 1 nebulizer treatment up to every 6 hours if needed for shortness of breath, chest tightness, wheezing.  We will order a new nebulizer machine for you today. Flu shot is up-to-date Follow-up in our office in 6 months, sooner if you have any problems.

## 2024-04-28 NOTE — Assessment & Plan Note (Signed)
 Continue PPI

## 2024-04-28 NOTE — Progress Notes (Signed)
 "  Subjective:    Patient ID: Andrea Perry, female    DOB: 05/27/1950, 74 y.o.   MRN: [id number]  HPI  ROV 08/04/2023 --follow-up visit for [facility].  She is 74 years old with a history of COPD and asthmatic features (positive bronchodilator response), mild peripheral eosinophilia with chronic rhinitis. PMH: A-fib, hypertension, diabetes, secondary PAH, OSA not on CPAP We have been managing her on Breztri , DuoNeb if needed, pantoprazole , fluticasone  nasal spray, Zyrtec. She reports that her breathing has been up and down - corresponds with her level of exertion. She does some light house work. She is able to shop. Limited by breathing and leg weakness. She is on breztri  - feels that she benefits. She is using duoneb about 2-3x a day. No other hospitalizations since last time. She would benefit from pt assistance program for the breztri .   ROV 04/28/2024 --74 year old woman with a history of COPD and a positive bronchodilator response, mild peripheral eosinophilia, chronic rhinitis, OSA not on CPAP.  She has atrial fibrillation on Eliquis  and hypertension with secondary PAH, diabetes.  She has been managed on Breztri , DuoNeb as needed, Zyrtec, protonix .  Her nebulizer machine is broken and she needs a new one.  We will order this today.  She is using duoneb 3-4x a day, does get good benefit from this - hasn't taken in a few months, has missed it. She needs financial assist from company for the Breztri . She has dry daily cough. No recent flares.   Pulmonary function testing performed today and reviewed by me shows moderately severe obstruction with a borderline bronchodilator response, hyperinflated lung volumes, normal diffusion capacity. FEV1 is improved compared with 2020.       Review of Systems  Constitutional:  Negative for fever and unexpected weight change.  HENT:  Negative for congestion, dental problem, ear pain, nosebleeds, postnasal drip, rhinorrhea, sinus pressure, sneezing, sore throat  and trouble swallowing.   Eyes:  Negative for redness and itching.  Respiratory:  Positive for cough and shortness of breath. Negative for chest tightness and wheezing.   Cardiovascular:  Negative for palpitations and leg swelling.  Gastrointestinal:  Negative for nausea and vomiting.  Genitourinary:  Negative for dysuria.  Musculoskeletal:  Negative for joint swelling.  Skin:  Negative for rash.  Neurological:  Negative for headaches.  Hematological:  Does not bruise/bleed easily.  Psychiatric/Behavioral:  Negative for dysphoric mood. The patient is not nervous/anxious.        Objective:   Physical Exam Vitals:   04/28/24 0954  BP: 110/64  Pulse: 90  SpO2: 100%  Weight: 258 lb (117 kg)  Height: 5' 2 (1.575 m)    Gen: Pleasant, obese woman,  in no distress,  normal affect  ENT: No lesions,  mouth clear,  oropharynx clear, no postnasal drip  Neck: No JVD, no stridor  Lungs: No use of accessory muscles, clear, distant at both bases  Cardiovascular: RRR, heart sounds normal, no murmur or gallops, bilateral foot edema with some erythema, no weeping, no lesions or sores  Musculoskeletal: No deformities, venous insufficiency  Neuro: alert, non focal  Skin: ankles cool, no lesions or rashes      Assessment & Plan:  COPD (chronic obstructive pulmonary disease) (HCC) No flares.  Flu shot is up-to-date.  She does have moderately severe obstruction, superimposed restriction.  On an aggressive BD regimen.  We reviewed your pulmonary function testing today.  There is evidence for moderate asthma/COPD but your airflows are actually improved compared  with 5 years ago. Please continue Breztri  2 puffs twice a day.  Rinse and gargle after using.  We will work on getting financial assistance for this medication Keep your DuoNeb available to use 1 nebulizer treatment up to every 6 hours if needed for shortness of breath, chest tightness, wheezing.  We will order a new nebulizer machine  for you today. Flu shot is up-to-date Follow-up in our office in 6 months, sooner if you have any problems.  Obstructive sleep apnea Untreated.  She does not believe she would be able to tolerate CPAP.  We may decide to perform an overnight oximetry at some point going forward.  GERD (gastroesophageal reflux disease) Continue PPI  Allergic rhinitis Continue Zyrtec  Obesity, Class III, BMI 40-49.9 (morbid obesity) With associated restrictive disease.  Contributing to her dyspnea.  May need to repeat a walking oximetry at some point going forward.  I personally spent a total of 23 minutes in the care of the patient today including preparing to see the patient, getting/reviewing separately obtained history, performing a medically appropriate exam/evaluation, counseling and educating, referring and communicating with other health care professionals, documenting clinical information in the EHR, independently interpreting results, and communicating results.   Lamar Chris, MD, PhD 04/28/2024, 10:29 AM [facility] [phone] or if no answer [phone]  "

## 2024-04-28 NOTE — Telephone Encounter (Signed)
 Order has been sent to [facility] NFN

## 2024-04-28 NOTE — Patient Instructions (Signed)
 Full PFT performed today.

## 2024-04-28 NOTE — Telephone Encounter (Signed)
 Will send as soon as order is signed

## 2024-04-28 NOTE — Telephone Encounter (Signed)
 Pt presents in office for ov with [name]   Tammy placed order 11/3 for neb machine but pt states she was contacted saying her order was missing information and they needed a new order.   I have placed a new order, [facility] please send to [facility] ASAP. Thanks!

## 2024-04-28 NOTE — Progress Notes (Signed)
 Full PFT performed today.

## 2024-04-28 NOTE — Assessment & Plan Note (Signed)
 Untreated.  She does not believe she would be able to tolerate CPAP.  We may decide to perform an overnight oximetry at some point going forward.

## 2024-05-01 ENCOUNTER — Encounter (HOSPITAL_COMMUNITY): Payer: Self-pay | Admitting: Urology

## 2024-05-01 NOTE — Progress Notes (Signed)
 LITHO PREOP PHONE CALL   ALLERGIES REVIEWED: YES  MEDICATION REVIEW DONE: YES MEDICATIONS THAT PT SHOULD HOLD (LIST): Nsaids hold 48hr, Eliquis  2 day hold last dose 1/13  CAN PT WALK UP STAIRS WITHOUT SHORTNESS OF BREATH: NO HOME O2: NO CPAP: NO- on chart but patient says doesn't use  IF YES, INFORMED PT TO BRING CPAP WITH TUBING AND MASK:YES/NO   INFORMED DRIVER NEEDED FOR PROCEDURE: YES   PT WAS GIVEN BLUE FOLDER AT UROLOGY APPT: YES PT INFORMED TO BRING BLUE FOLDER WITH ALL CONTENTS: YES  REVIEWED ARRIVAL TIME AND LOCATION: YES  OTHER PERTINENT INFORMATION:

## 2024-05-05 ENCOUNTER — Ambulatory Visit (HOSPITAL_COMMUNITY)

## 2024-05-05 ENCOUNTER — Encounter (HOSPITAL_COMMUNITY): Admission: RE | Disposition: A | Payer: Self-pay | Source: Home / Self Care | Attending: Urology

## 2024-05-05 ENCOUNTER — Telehealth: Payer: Self-pay

## 2024-05-05 ENCOUNTER — Ambulatory Visit (HOSPITAL_COMMUNITY)
Admission: RE | Admit: 2024-05-05 | Discharge: 2024-05-05 | Disposition: A | Discharge status: Home / Self Care | Source: Ambulatory Visit | Attending: Urology | Admitting: Urology

## 2024-05-05 DIAGNOSIS — Z539 Procedure and treatment not carried out, unspecified reason: Secondary | ICD-10-CM | POA: Diagnosis not present

## 2024-05-05 DIAGNOSIS — Z6841 Body Mass Index (BMI) 40.0 and over, adult: Secondary | ICD-10-CM | POA: Insufficient documentation

## 2024-05-05 DIAGNOSIS — N3281 Overactive bladder: Secondary | ICD-10-CM | POA: Diagnosis not present

## 2024-05-05 DIAGNOSIS — E669 Obesity, unspecified: Secondary | ICD-10-CM | POA: Insufficient documentation

## 2024-05-05 DIAGNOSIS — N390 Urinary tract infection, site not specified: Secondary | ICD-10-CM | POA: Diagnosis not present

## 2024-05-05 DIAGNOSIS — Z87891 Personal history of nicotine dependence: Secondary | ICD-10-CM | POA: Diagnosis not present

## 2024-05-05 DIAGNOSIS — Z7901 Long term (current) use of anticoagulants: Secondary | ICD-10-CM | POA: Insufficient documentation

## 2024-05-05 DIAGNOSIS — Z7984 Long term (current) use of oral hypoglycemic drugs: Secondary | ICD-10-CM | POA: Insufficient documentation

## 2024-05-05 DIAGNOSIS — N200 Calculus of kidney: Secondary | ICD-10-CM | POA: Insufficient documentation

## 2024-05-05 HISTORY — PX: EXTRACORPOREAL SHOCK WAVE LITHOTRIPSY: SHX1557

## 2024-05-05 MED ORDER — DIAZEPAM 5 MG PO TABS
10.0000 mg | ORAL_TABLET | ORAL | Status: AC
  Administered 2024-05-05: 5 mg via ORAL
  Filled 2024-05-05 (×2): qty 2

## 2024-05-05 MED ORDER — DIPHENHYDRAMINE HCL 25 MG PO CAPS
25.0000 mg | ORAL_CAPSULE | ORAL | Status: AC
  Administered 2024-05-05: 25 mg via ORAL
  Filled 2024-05-05: qty 1

## 2024-05-05 MED ORDER — CIPROFLOXACIN HCL 500 MG PO TABS
500.0000 mg | ORAL_TABLET | ORAL | Status: AC
  Administered 2024-05-05: 500 mg via ORAL
  Filled 2024-05-05: qty 1

## 2024-05-05 MED ORDER — SODIUM CHLORIDE 0.9 % IV SOLN: INTRAVENOUS | Status: DC

## 2024-05-05 NOTE — Telephone Encounter (Signed)
 Copied from CRM [id number]. Topic: Clinical - Prescription Issue >> May 05, 2024  1:56 PM Benton KIDD wrote: Reason for CRM: jeremy pharmacist we have a prescription that was faxed to us  they didnt mark anydirection on prescription Breztri  Please give mr jeremy a call back concerning the directions for prescription [phone]  Order number provider can reference when they call  and say they will like to speak with pharmacist  [phone]   Called and spoke with [employer] for Breztri  confirmation. I gave verbal directions for the usage for Breztri  and verified directions.  Nothing further needed.

## 2024-05-05 NOTE — Interval H&P Note (Signed)
 History and Physical Interval Note:  05/05/2024 10:03 AM  Andrea Perry  has presented today for surgery, with the diagnosis of RIGHT KIDNEY STONE.  The various methods of treatment have been discussed with the patient and family. After consideration of risks, benefits and other options for treatment, the patient has consented to  Procedures: LITHOTRIPSY, ESWL (Right) as a surgical intervention.  The patient's history has been reviewed, patient examined, no change in status, stable for surgery.  I have reviewed the patient's chart and labs.  Questions were answered to the patient's satisfaction.     Shauntel Prest L Jemila Camille

## 2024-05-08 ENCOUNTER — Encounter (HOSPITAL_COMMUNITY): Payer: Self-pay | Admitting: Urology

## 2024-05-09 ENCOUNTER — Encounter: Payer: Self-pay | Admitting: *Deleted

## 2024-05-09 NOTE — Progress Notes (Signed)
 Andrea Perry                                          MRN: [id number]   05/09/2024   The [facility]BCI Quality Team Specialist reviewed this patient medical record for the purposes of chart review for care gap closure. The following were reviewed: chart review for care gap closure-kidney health evaluation for diabetes:eGFR  and uACR.    VBCI Quality Team

## 2024-05-12 ENCOUNTER — Other Ambulatory Visit: Payer: Self-pay | Admitting: Urology

## 2024-05-22 ENCOUNTER — Encounter (HOSPITAL_COMMUNITY): Payer: Self-pay | Admitting: Urology

## 2024-05-25 ENCOUNTER — Other Ambulatory Visit: Payer: Self-pay | Admitting: Internal Medicine

## 2024-05-25 DIAGNOSIS — E039 Hypothyroidism, unspecified: Secondary | ICD-10-CM

## 2024-05-26 ENCOUNTER — Other Ambulatory Visit: Payer: Self-pay

## 2024-06-02 ENCOUNTER — Ambulatory Visit (HOSPITAL_COMMUNITY): Admission: RE | Admit: 2024-06-02 | Admitting: Urology

## 2024-06-02 ENCOUNTER — Encounter (HOSPITAL_COMMUNITY): Admission: RE | Payer: Self-pay | Source: Home / Self Care

## 2024-10-12 ENCOUNTER — Ambulatory Visit: Admitting: Emergency Medicine
